# Patient Record
Sex: Male | Born: 1945 | Race: Black or African American | Hispanic: No | Smoking: Former smoker
Health system: Southern US, Community
[De-identification: ages and names within clinical notes are randomized; demographics above are authoritative.]

## PROBLEM LIST (undated history)

## (undated) DIAGNOSIS — I4891 Unspecified atrial fibrillation: Secondary | ICD-10-CM

## (undated) DIAGNOSIS — M109 Gout, unspecified: Secondary | ICD-10-CM

## (undated) DIAGNOSIS — E785 Hyperlipidemia, unspecified: Secondary | ICD-10-CM

## (undated) DIAGNOSIS — I639 Cerebral infarction, unspecified: Secondary | ICD-10-CM

## (undated) DIAGNOSIS — D696 Thrombocytopenia, unspecified: Secondary | ICD-10-CM

## (undated) DIAGNOSIS — I10 Essential (primary) hypertension: Secondary | ICD-10-CM

## (undated) DIAGNOSIS — E042 Nontoxic multinodular goiter: Secondary | ICD-10-CM

## (undated) HISTORY — DX: Morbid (severe) obesity due to excess calories: E66.01

## (undated) HISTORY — DX: Nontoxic multinodular goiter: E04.2

## (undated) HISTORY — DX: Hyperlipidemia, unspecified: E78.5

## (undated) HISTORY — PX: THYROID SURGERY: SHX805

## (undated) HISTORY — DX: Unspecified atrial fibrillation: I48.91

---

## 1998-10-06 ENCOUNTER — Encounter: Admission: RE | Admit: 1998-10-06 | Discharge: 1998-11-11 | Payer: Self-pay | Admitting: *Deleted

## 2000-04-04 ENCOUNTER — Encounter: Payer: Self-pay | Admitting: General Surgery

## 2000-04-04 ENCOUNTER — Encounter (INDEPENDENT_AMBULATORY_CARE_PROVIDER_SITE_OTHER): Payer: Self-pay | Admitting: Specialist

## 2000-04-04 ENCOUNTER — Ambulatory Visit (HOSPITAL_COMMUNITY): Admission: RE | Admit: 2000-04-04 | Discharge: 2000-04-04 | Payer: Self-pay | Admitting: General Surgery

## 2002-06-23 ENCOUNTER — Encounter: Payer: Self-pay | Admitting: General Surgery

## 2002-06-23 ENCOUNTER — Ambulatory Visit (HOSPITAL_COMMUNITY): Admission: RE | Admit: 2002-06-23 | Discharge: 2002-06-23 | Payer: Self-pay | Admitting: General Surgery

## 2002-08-11 ENCOUNTER — Encounter: Payer: Self-pay | Admitting: General Surgery

## 2002-08-13 ENCOUNTER — Encounter (INDEPENDENT_AMBULATORY_CARE_PROVIDER_SITE_OTHER): Payer: Self-pay | Admitting: *Deleted

## 2002-08-13 ENCOUNTER — Ambulatory Visit (HOSPITAL_COMMUNITY): Admission: RE | Admit: 2002-08-13 | Discharge: 2002-08-14 | Payer: Self-pay | Admitting: General Surgery

## 2006-11-26 ENCOUNTER — Ambulatory Visit: Payer: Self-pay | Admitting: Cardiology

## 2006-12-12 ENCOUNTER — Ambulatory Visit: Payer: Self-pay

## 2007-01-02 ENCOUNTER — Ambulatory Visit: Payer: Self-pay | Admitting: Cardiology

## 2007-12-11 ENCOUNTER — Ambulatory Visit: Payer: Self-pay | Admitting: Cardiology

## 2009-03-10 ENCOUNTER — Ambulatory Visit: Payer: Self-pay | Admitting: Cardiology

## 2009-03-10 DIAGNOSIS — I480 Paroxysmal atrial fibrillation: Secondary | ICD-10-CM | POA: Insufficient documentation

## 2010-04-05 NOTE — Assessment & Plan Note (Signed)
Summary: [facility]   Visit Type:  Follow-up Primary Provider:  Dr. Vernon Prey  CC:  Atrial Fibrillation.  History of Present Illness: The patient presents for yearly followup. Since I last saw him he has had no new cardiovascular complaints. He has never felt palpitations with his permanent atrial fibrillation. He denies any presyncope or syncope. He has no chest, neck or arm discomfort. He does have some mild bilateral lower extremity swelling. He does not have new shortness of breath and has no PND or orthopnea. He is limited by his morbid obesity and joint problems.  Current Medications (verified): 1)  Celebrex 200 Mg Caps (Celecoxib) .Marland Kitchen.. 1 By Mouth Daily 2)  Levothyroxine Sodium 125 Mcg Tabs (Levothyroxine Sodium) .Marland Kitchen.. 1 By Mouth Daily 3)  Ramipril 10 Mg Caps (Ramipril) .Marland Kitchen.. 1 By Mouth Daily 4)  Allopurinol 300 Mg Tabs (Allopurinol) .Marland Kitchen.. 1 By Mouth Daily 5)  Furosemide 40 Mg Tabs (Furosemide) .Marland Kitchen.. 1 By Mouth Daily 6)  Simvastatin 40 Mg Tabs (Simvastatin) .... 1/2 By Mouth Every Other Day 7)  Metoprolol Tartrate 100 Mg Tabs (Metoprolol Tartrate) .Marland Kitchen.. 1 By Mouth Daily 8)  Aspirin 325 Mg  Tabs (Aspirin) .Marland Kitchen.. 1 By Mouth Daily 9)  Klor-Con M20 20 Meq Cr-Tabs (Potassium Chloride Crys Cr) .Marland Kitchen.. 1 By Mouth Daily 10)  Exforge 10-320 Mg Tabs (Amlodipine Besylate-Valsartan) .... 1/2 By Mouth Daily  Allergies (verified): No Known Drug Allergies  Past History:  Past Medical History:  Hypertension times approximately 20 years,   multinodular goiter, morbid obesity, atrial fibrillation  Past Surgical History: Thyroid surgery in 2005.   Review of Systems       As stated in the HPI and negative for all other systems.   Vital Signs:  Patient profile:   65 year old male Height:      67 inches Weight:      350 pounds Pulse rate:   80 / minute Resp:     16 per minute BP sitting:   114 / 74  (left arm)  Vitals Entered By: Marrion Coy, CNA (March 10, 2009 9:26 AM)  Physical  Exam  General:  Well developed, well nourished, in no acute distress. Head:  normocephalic and atraumatic Mouth:  Gums and palate normal. Oral mucosa normal. Neck:  Neck supple, no JVD. No masses, thyromegaly or abnormal cervical nodes. Chest Wall:  no deformities or breast masses noted Lungs:  Clear bilaterally to auscultation and percussion. Heart:  Non-displaced PMI, chest non-tender; irregular rate and rhythm, S1, S2 without murmurs, rubs or gallops. Carotid upstroke normal, no bruit.  Abdomen:  Bowel sounds positive; abdomen soft and non-tender without masses, or hernias noted. No hepatosplenomegaly, obese, unable to appreciate organomegaly or midline pulsatile mass secondary to his size. Msk:  Back normal, normal gait. Muscle strength and tone normal. Extremities:  mild bilateral lower extremity edema Neurologic:  Alert and oriented x 3. Skin:  Intact without lesions or rashes. Psych:  Normal affect.   EKG  Procedure date:  03/10/2009  Findings:      atrial fibrillation, rate 80, axis within normal limits, intervals within normal limits, poor anterior R-wave progression, no acute ST-T wave changes.  Impression & Recommendations:  Problem # 1:  FIBRILLATION, ATRIAL (ICD-427.31) The patient has no symptoms related to this.  He still has a CHADS score of 1 and does not need anticoagulation.  At this point he will continue on the meds as listed without change in therapy. He udnerstands the need to lose  weight with diet and exercise.

## 2010-05-12 ENCOUNTER — Other Ambulatory Visit (HOSPITAL_COMMUNITY): Payer: Medicare Other

## 2010-05-12 ENCOUNTER — Emergency Department (HOSPITAL_COMMUNITY): Payer: Medicare Other

## 2010-05-12 ENCOUNTER — Inpatient Hospital Stay (HOSPITAL_COMMUNITY)
Admission: EM | Admit: 2010-05-12 | Discharge: 2010-05-16 | DRG: 065 | Disposition: A | Discharge status: Rehabilitation Facility | Payer: Medicare Other | Attending: Internal Medicine | Admitting: Internal Medicine

## 2010-05-12 DIAGNOSIS — E039 Hypothyroidism, unspecified: Secondary | ICD-10-CM | POA: Diagnosis present

## 2010-05-12 DIAGNOSIS — E785 Hyperlipidemia, unspecified: Secondary | ICD-10-CM | POA: Diagnosis present

## 2010-05-12 DIAGNOSIS — I517 Cardiomegaly: Secondary | ICD-10-CM

## 2010-05-12 DIAGNOSIS — I10 Essential (primary) hypertension: Secondary | ICD-10-CM | POA: Diagnosis present

## 2010-05-12 DIAGNOSIS — M109 Gout, unspecified: Secondary | ICD-10-CM | POA: Diagnosis present

## 2010-05-12 DIAGNOSIS — G8190 Hemiplegia, unspecified affecting unspecified side: Secondary | ICD-10-CM | POA: Diagnosis present

## 2010-05-12 DIAGNOSIS — I6529 Occlusion and stenosis of unspecified carotid artery: Secondary | ICD-10-CM | POA: Diagnosis present

## 2010-05-12 DIAGNOSIS — J45909 Unspecified asthma, uncomplicated: Secondary | ICD-10-CM | POA: Diagnosis present

## 2010-05-12 DIAGNOSIS — R4789 Other speech disturbances: Secondary | ICD-10-CM | POA: Diagnosis present

## 2010-05-12 DIAGNOSIS — R29818 Other symptoms and signs involving the nervous system: Secondary | ICD-10-CM

## 2010-05-12 DIAGNOSIS — Z79899 Other long term (current) drug therapy: Secondary | ICD-10-CM

## 2010-05-12 DIAGNOSIS — I6350 Cerebral infarction due to unspecified occlusion or stenosis of unspecified cerebral artery: Principal | ICD-10-CM | POA: Diagnosis present

## 2010-05-12 DIAGNOSIS — E042 Nontoxic multinodular goiter: Secondary | ICD-10-CM | POA: Diagnosis present

## 2010-05-12 DIAGNOSIS — Z7982 Long term (current) use of aspirin: Secondary | ICD-10-CM

## 2010-05-12 DIAGNOSIS — I4891 Unspecified atrial fibrillation: Secondary | ICD-10-CM | POA: Diagnosis present

## 2010-05-12 HISTORY — DX: Essential (primary) hypertension: I10

## 2010-05-12 LAB — DIFFERENTIAL
Basophils Absolute: 0 10*3/uL (ref 0.0–0.1)
Basophils Relative: 1 % (ref 0–1)
Eosinophils Absolute: 0.2 10*3/uL (ref 0.0–0.7)
Eosinophils Relative: 5 % (ref 0–5)
Lymphocytes Relative: 29 % (ref 12–46)
Lymphs Abs: 1.1 10*3/uL (ref 0.7–4.0)
Monocytes Absolute: 0.6 10*3/uL (ref 0.1–1.0)
Monocytes Relative: 16 % — ABNORMAL HIGH (ref 3–12)
Neutro Abs: 2 10*3/uL (ref 1.7–7.7)
Neutrophils Relative %: 50 % (ref 43–77)

## 2010-05-12 LAB — URINALYSIS, ROUTINE W REFLEX MICROSCOPIC
Bilirubin Urine: NEGATIVE
Glucose, UA: NEGATIVE mg/dL
Hgb urine dipstick: NEGATIVE
Specific Gravity, Urine: 1.019 (ref 1.005–1.030)
Urobilinogen, UA: 1 mg/dL (ref 0.0–1.0)

## 2010-05-12 LAB — CBC
HCT: 45.9 % (ref 39.0–52.0)
Hemoglobin: 15.2 g/dL (ref 13.0–17.0)
MCH: 31.7 pg (ref 26.0–34.0)
MCHC: 33.1 g/dL (ref 30.0–36.0)
MCV: 95.8 fL (ref 78.0–100.0)
Platelets: 110 10*3/uL — ABNORMAL LOW (ref 150–400)
RBC: 4.79 MIL/uL (ref 4.22–5.81)
RDW: 13.8 % (ref 11.5–15.5)
WBC: 3.9 10*3/uL — ABNORMAL LOW (ref 4.0–10.5)

## 2010-05-12 LAB — COMPREHENSIVE METABOLIC PANEL
AST: 17 U/L (ref 0–37)
AST: 18 U/L (ref 0–37)
Albumin: 3.3 g/dL — ABNORMAL LOW (ref 3.5–5.2)
Alkaline Phosphatase: 86 U/L (ref 39–117)
BUN: 14 mg/dL (ref 6–23)
BUN: 13 mg/dL (ref 6–23)
CO2: 27 mEq/L (ref 19–32)
Calcium: 8.5 mg/dL (ref 8.4–10.5)
Chloride: 102 mEq/L (ref 96–112)
Chloride: 104 mEq/L (ref 96–112)
Creatinine, Ser: 1 mg/dL (ref 0.4–1.5)
Creatinine, Ser: 0.91 mg/dL (ref 0.4–1.5)
GFR calc Af Amer: >60 mL/min (ref >60)
GFR calc non Af Amer: >60 mL/min (ref >60)
Potassium: 3.9 mEq/L (ref 3.5–5.1)
Total Bilirubin: 0.4 mg/dL (ref 0.3–1.2)
Total Protein: 8 g/dL (ref 6.0–8.3)

## 2010-05-12 LAB — HEMOGLOBIN A1C: Mean Plasma Glucose: 120 mg/dL — ABNORMAL HIGH (ref <117)

## 2010-05-12 LAB — LIPID PANEL
Cholesterol: 154 mg/dL (ref 0–200)
LDL Cholesterol: 108 mg/dL — ABNORMAL HIGH (ref 0–99)
Total CHOL/HDL Ratio: 4.4 RATIO

## 2010-05-12 LAB — URINE MICROSCOPIC-ADD ON

## 2010-05-12 LAB — GLUCOSE, CAPILLARY
Glucose-Capillary: 96 mg/dL (ref 70–99)
Glucose-Capillary: 146 mg/dL — ABNORMAL HIGH (ref 70–99)

## 2010-05-12 LAB — CARDIAC PANEL(CRET KIN+CKTOT+MB+TROPI)
CK, MB: 0.9 ng/mL (ref 0.3–4.0)
Relative Index: INVALID (ref 0.0–2.5)
Total CK: 88 U/L (ref 7–232)

## 2010-05-12 LAB — APTT: aPTT: 30 seconds (ref 24–37)

## 2010-05-12 LAB — CK TOTAL AND CKMB (NOT AT ARMC)
CK, MB: 1.1 ng/mL (ref 0.3–4.0)
Total CK: 81 U/L (ref 7–232)

## 2010-05-13 ENCOUNTER — Encounter (HOSPITAL_COMMUNITY): Payer: Self-pay | Admitting: Radiology

## 2010-05-13 ENCOUNTER — Inpatient Hospital Stay (HOSPITAL_COMMUNITY): Payer: Medicare Other

## 2010-05-13 DIAGNOSIS — I6340 Cerebral infarction due to embolism of unspecified cerebral artery: Secondary | ICD-10-CM

## 2010-05-13 LAB — LIPID PANEL
LDL Cholesterol: 103 mg/dL — ABNORMAL HIGH (ref 0–99)
Triglycerides: 59 mg/dL (ref <150)
VLDL: 12 mg/dL (ref 0–40)

## 2010-05-13 LAB — BASIC METABOLIC PANEL
CO2: 27 mEq/L (ref 19–32)
Calcium: 8.7 mg/dL (ref 8.4–10.5)
Creatinine, Ser: 0.86 mg/dL (ref 0.4–1.5)
GFR calc Af Amer: >60 mL/min (ref >60)
GFR calc non Af Amer: >60 mL/min (ref >60)
Glucose, Bld: 100 mg/dL — ABNORMAL HIGH (ref 70–99)
Sodium: 135 mEq/L (ref 135–145)

## 2010-05-13 LAB — CBC
Hemoglobin: 14.8 g/dL (ref 13.0–17.0)
MCH: 31.7 pg (ref 26.0–34.0)
MCHC: 33.6 g/dL (ref 30.0–36.0)
RDW: 13.7 % (ref 11.5–15.5)

## 2010-05-13 LAB — GLUCOSE, CAPILLARY
Glucose-Capillary: 100 mg/dL — ABNORMAL HIGH (ref 70–99)
Glucose-Capillary: 101 mg/dL — ABNORMAL HIGH (ref 70–99)

## 2010-05-13 MED ORDER — IOHEXOL 350 MG/ML SOLN
75.0000 mL | Freq: Once | INTRAVENOUS | Status: AC | PRN
  Administered 2010-05-13: 75 mL via INTRAVENOUS

## 2010-05-14 LAB — GLUCOSE, CAPILLARY

## 2010-05-14 LAB — PROTIME-INR
INR: 1.09 (ref 0.00–1.49)
Prothrombin Time: 14.3 seconds (ref 11.6–15.2)

## 2010-05-15 LAB — BASIC METABOLIC PANEL
Chloride: 104 mEq/L (ref 96–112)
Creatinine, Ser: 0.84 mg/dL (ref 0.4–1.5)
GFR calc Af Amer: >60 mL/min (ref >60)
GFR calc non Af Amer: >60 mL/min (ref >60)
Potassium: 4.4 mEq/L (ref 3.5–5.1)

## 2010-05-15 LAB — GLUCOSE, CAPILLARY: Glucose-Capillary: 111 mg/dL — ABNORMAL HIGH (ref 70–99)

## 2010-05-15 LAB — PROTIME-INR: INR: 1.38 (ref 0.00–1.49)

## 2010-05-15 LAB — TSH: TSH: 1.702 u[IU]/mL (ref 0.350–4.500)

## 2010-05-16 ENCOUNTER — Inpatient Hospital Stay (HOSPITAL_COMMUNITY)
Admission: RE | Admit: 2010-05-16 | Discharge: 2010-06-10 | DRG: 945 | Disposition: A | Discharge status: Home Health | Payer: Medicare Other | Source: Other Acute Inpatient Hospital | Attending: Physical Medicine & Rehabilitation | Admitting: Physical Medicine & Rehabilitation

## 2010-05-16 DIAGNOSIS — R55 Syncope and collapse: Secondary | ICD-10-CM | POA: Diagnosis not present

## 2010-05-16 DIAGNOSIS — E039 Hypothyroidism, unspecified: Secondary | ICD-10-CM | POA: Diagnosis present

## 2010-05-16 DIAGNOSIS — Z7901 Long term (current) use of anticoagulants: Secondary | ICD-10-CM

## 2010-05-16 DIAGNOSIS — I6350 Cerebral infarction due to unspecified occlusion or stenosis of unspecified cerebral artery: Secondary | ICD-10-CM | POA: Diagnosis present

## 2010-05-16 DIAGNOSIS — I10 Essential (primary) hypertension: Secondary | ICD-10-CM | POA: Diagnosis present

## 2010-05-16 DIAGNOSIS — M109 Gout, unspecified: Secondary | ICD-10-CM | POA: Diagnosis present

## 2010-05-16 DIAGNOSIS — D696 Thrombocytopenia, unspecified: Secondary | ICD-10-CM | POA: Diagnosis present

## 2010-05-16 DIAGNOSIS — I4891 Unspecified atrial fibrillation: Secondary | ICD-10-CM | POA: Diagnosis present

## 2010-05-16 DIAGNOSIS — I6330 Cerebral infarction due to thrombosis of unspecified cerebral artery: Secondary | ICD-10-CM

## 2010-05-16 DIAGNOSIS — M1710 Unilateral primary osteoarthritis, unspecified knee: Secondary | ICD-10-CM | POA: Diagnosis present

## 2010-05-16 DIAGNOSIS — G8190 Hemiplegia, unspecified affecting unspecified side: Secondary | ICD-10-CM | POA: Diagnosis present

## 2010-05-16 DIAGNOSIS — Z5189 Encounter for other specified aftercare: Principal | ICD-10-CM

## 2010-05-16 DIAGNOSIS — E119 Type 2 diabetes mellitus without complications: Secondary | ICD-10-CM | POA: Diagnosis present

## 2010-05-16 DIAGNOSIS — Z7982 Long term (current) use of aspirin: Secondary | ICD-10-CM

## 2010-05-16 DIAGNOSIS — Z87891 Personal history of nicotine dependence: Secondary | ICD-10-CM

## 2010-05-16 DIAGNOSIS — R29810 Facial weakness: Secondary | ICD-10-CM | POA: Diagnosis present

## 2010-05-16 LAB — GLUCOSE, CAPILLARY
Glucose-Capillary: 97 mg/dL (ref 70–99)
Glucose-Capillary: 99 mg/dL (ref 70–99)

## 2010-05-16 LAB — PROTIME-INR: Prothrombin Time: 20.8 seconds — ABNORMAL HIGH (ref 11.6–15.2)

## 2010-05-17 DIAGNOSIS — Z5189 Encounter for other specified aftercare: Secondary | ICD-10-CM

## 2010-05-17 DIAGNOSIS — I6330 Cerebral infarction due to thrombosis of unspecified cerebral artery: Secondary | ICD-10-CM

## 2010-05-17 LAB — DIFFERENTIAL
Eosinophils Absolute: 0.1 10*3/uL (ref 0.0–0.7)
Lymphocytes Relative: 18 % (ref 12–46)
Lymphs Abs: 1.2 10*3/uL (ref 0.7–4.0)
Monocytes Relative: 21 % — ABNORMAL HIGH (ref 3–12)
Neutro Abs: 4 10*3/uL (ref 1.7–7.7)
Neutrophils Relative %: 60 % (ref 43–77)

## 2010-05-17 LAB — CBC
HCT: 48.7 % (ref 39.0–52.0)
Hemoglobin: 16.9 g/dL (ref 13.0–17.0)
MCH: 32.4 pg (ref 26.0–34.0)
MCV: 93.3 fL (ref 78.0–100.0)
RBC: 5.22 MIL/uL (ref 4.22–5.81)
WBC: 6.8 10*3/uL (ref 4.0–10.5)

## 2010-05-17 LAB — COMPREHENSIVE METABOLIC PANEL
Alkaline Phosphatase: 87 U/L (ref 39–117)
BUN: 15 mg/dL (ref 6–23)
CO2: 27 mEq/L (ref 19–32)
Chloride: 100 mEq/L (ref 96–112)
Creatinine, Ser: 0.95 mg/dL (ref 0.4–1.5)
GFR calc non Af Amer: >60 mL/min (ref >60)
Glucose, Bld: 106 mg/dL — ABNORMAL HIGH (ref 70–99)
Potassium: 4.5 mEq/L (ref 3.5–5.1)
Total Bilirubin: 0.8 mg/dL (ref 0.3–1.2)

## 2010-05-17 LAB — GLUCOSE, CAPILLARY
Glucose-Capillary: 104 mg/dL — ABNORMAL HIGH (ref 70–99)
Glucose-Capillary: 107 mg/dL — ABNORMAL HIGH (ref 70–99)

## 2010-05-18 ENCOUNTER — Inpatient Hospital Stay (HOSPITAL_COMMUNITY): Payer: Medicare Other

## 2010-05-18 LAB — BASIC METABOLIC PANEL
BUN: 24 mg/dL — ABNORMAL HIGH (ref 6–23)
CO2: 26 mEq/L (ref 19–32)
Chloride: 99 mEq/L (ref 96–112)
Creatinine, Ser: 1.24 mg/dL (ref 0.4–1.5)
Glucose, Bld: 112 mg/dL — ABNORMAL HIGH (ref 70–99)
Potassium: 4.2 mEq/L (ref 3.5–5.1)

## 2010-05-18 LAB — GLUCOSE, CAPILLARY
Glucose-Capillary: 102 mg/dL — ABNORMAL HIGH (ref 70–99)
Glucose-Capillary: 129 mg/dL — ABNORMAL HIGH (ref 70–99)

## 2010-05-18 LAB — CBC
HCT: 47.2 % (ref 39.0–52.0)
Hemoglobin: 16.5 g/dL (ref 13.0–17.0)
MCH: 32.7 pg (ref 26.0–34.0)
MCV: 93.5 fL (ref 78.0–100.0)
RBC: 5.05 MIL/uL (ref 4.22–5.81)
WBC: 7.6 10*3/uL (ref 4.0–10.5)

## 2010-05-18 LAB — PROTIME-INR: INR: 1.76 — ABNORMAL HIGH (ref 0.00–1.49)

## 2010-05-19 ENCOUNTER — Inpatient Hospital Stay (HOSPITAL_COMMUNITY): Payer: Medicare Other

## 2010-05-19 LAB — GLUCOSE, CAPILLARY
Glucose-Capillary: 91 mg/dL (ref 70–99)
Glucose-Capillary: 141 mg/dL — ABNORMAL HIGH (ref 70–99)

## 2010-05-19 LAB — PROTIME-INR: Prothrombin Time: 22.6 seconds — ABNORMAL HIGH (ref 11.6–15.2)

## 2010-05-19 LAB — URINE CULTURE
Colony Count: 85000
Culture  Setup Time: 201203140852

## 2010-05-20 DIAGNOSIS — Z5189 Encounter for other specified aftercare: Secondary | ICD-10-CM

## 2010-05-20 DIAGNOSIS — I6330 Cerebral infarction due to thrombosis of unspecified cerebral artery: Secondary | ICD-10-CM

## 2010-05-20 LAB — BASIC METABOLIC PANEL
CO2: 26 mEq/L (ref 19–32)
Calcium: 9.3 mg/dL (ref 8.4–10.5)
Chloride: 97 mEq/L (ref 96–112)
Creatinine, Ser: 1.36 mg/dL (ref 0.4–1.5)
GFR calc Af Amer: >60 mL/min (ref >60)
Sodium: 133 mEq/L — ABNORMAL LOW (ref 135–145)

## 2010-05-20 LAB — GLUCOSE, CAPILLARY
Glucose-Capillary: 88 mg/dL (ref 70–99)
Glucose-Capillary: 88 mg/dL (ref 70–99)
Glucose-Capillary: 93 mg/dL (ref 70–99)

## 2010-05-20 LAB — PROTIME-INR: INR: 2.34 — ABNORMAL HIGH (ref 0.00–1.49)

## 2010-05-20 NOTE — H&P (Signed)
NAME:  Omar [name], Omar [name] NO.:  [id number]  MEDICAL RECORD NO.:  [id number]           PATIENT TYPE:  I  LOCATION:  4010                         FACILITY:  [facility]  PHYSICIAN:  Ranelle Oyster, M.D.DATE OF BIRTH:  [birth year]  DATE OF ADMISSION:  05/16/2010 DATE OF DISCHARGE:                             HISTORY & PHYSICAL   CHIEF COMPLAINTS:  Right-sided weakness.  PRIMARY CARE PHYSICIAN:  Ernestina Penna, M.D.  CARDIOLOGIST:  Rollene Rotunda, MD, [facility].  NEUROLOGIST:  Pramod P. Pearlean Brownie, MD.  HISTORY OF PRESENT ILLNESS:  This is a 65 year old African American male with morbid obesity, AFib, and hypertension who was admitted on May 12, 2010, with right-sided weakness and an associated fall.  The CT was without acute abnormalities.  MRI was not done secondary to body habitus.  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  He has also had small vessel disease without acute infarct.  Neurology evaluated the patient, felt he has subcortical stroke.  Coumadin was recommended for CVA prophylaxis.  He continues to have right hemiparesis, mild right facial weakness.  I was asked to see the patient on Friday, March 9, and felt he could benefit from inpatient rehab stay.  REVIEW OF SYSTEMS:  Notable for weakness and constipation.  Full 12- point review is in the written H and P.  PAST MEDICAL HISTORY:  Notable for the above as well as right thyroid lobectomy in 2004, gout, bilateral knee osteoarthritis, left greater than right.  FAMILY HISTORY:  Positive for stomach issues and strokes.  SOCIAL HISTORY:  The patient is married, retired Curator.  He quit smoking 14 years ago and rarely drinks.  Family will assist at discharge.  Daughter works as an Engineer, production apparently and wife is limited due to her back.  ALLERGIES:  ANACIN, LIPITOR, NIASPAN.  HOME MEDICATIONS AND LABS:  Please see written H and  P.  PHYSICAL EXAMINATION:  VITAL SIGNS:  Blood pressure 115/72, pulse is 84, respiratory rate is 20, temperature 98.1. GENERAL:  The patient is pleasant, alert, lying in bed. HEENT:  Pupils equally round and reactive to light.  Ears, nose, and throat exam notable for multiple missing teeth.  Mucosa is pink and moist. NECK:  Supple without JVD or lymphadenopathy. CHEST:  Clear to auscultation bilaterally without wheezes, rales, or rhonchi. HEART:  Regular rate and rhythm without murmurs, rubs, or gallops. EXTREMITIES:  Pitting edema 1+ with chronic vascular changes. ABDOMEN:  Soft, nontender.  Bowel sounds are positive. SKIN:  Notable for the above as well as some scrotal edema also. NEUROLOGIC:  Cranial nerve exam II through XII is notable for some mild right facial weakness but no sensory loss.  Seemed to have a bit more attention to the left side with peripheral field testing as well. Sensation was grossly intact to pinprick and light touch throughout the right arm and leg today on examination.  Reflexes are 1+ and no resting tone was noted.  Strength was 0/5, right upper extremity proximal, distal.  I may have seen a flicker at the  fingers.  Right hip flexors and abductors are trace to 1.  I saw no active movement at the knee or ankle today.  Left upper extremity, left lower extremity are generally 4 to 5 out of 5 proximal and distal.  Judgment, orientation, memory seemed be fairly appropriate.  Mood was pleasant.  POST ADMISSION PHYSICIAN EVALUATION: 1. Functional deficit secondary to left subcortical CVA with primarily     right hemiparesis.  The patient is morbidly obese as well. 2. The patient is admitted to receive collaborative interdisciplinary     care between the physiatrist, rehab nursing staff, and therapy     team. 3. The patient's level of medical complexity and substantial therapy     needs in context of that medical necessity cannot be provided at     lesser  intensity of care. 4. The patient has experienced substantial functional loss from his     baseline.  Premorbidly, he was independent walking with cane.     Currently, he is mod to max assist upper body care, total assist     lower body care, total assist to sit edge of bed, unable to stand     at this point.  Judging by the patient's diagnosis, physical exam,     and functional history, he has potential for functional progress     which will result in measurable gains while inpatient rehab.  His     gains will be of substantial and practical use upon discharge to     home if facilitating mobility and self-care.  Interim changes since     my consult are detailed above. 5. The physiatrist will provide 24-hour management of medical needs as     well as oversight of the therapy plan/treatment and provide     guidance as appropriate during interaction of the two.  Medical     problem list and plan are below. 6. A 24-hour rehab nursing team will assist in manage of the patient's     skin care needs as well as pain management, integration of therapy     concepts, techniques, nutrition, etc. 7. PT will assess and treat for lower extremity strength, range of     motion, adaptive techniques, equipment, functional ability,     neuromuscular education.  The patient and family/caregiver     education.  Goals overall are min assist. 8. OT will assess and treat for upper extremity use, ADLs, adaptive     techniques, equipment, functional mobility, self-care,     neuromuscular education with goals modified independent to min     assist. 9. Speech language pathology will assess and treat briefly for     communication and cognitive issues with goals modified independent. 10.Case management social worker will assess and treat for     psychosocial issues in discharge planning. 11.Team conference will be held weekly to assess progress towards     goals and to determine barriers at discharge. 12.The  patient has demonstrated sufficient medical stability and     exercise capacity to tolerate 2 to 3 hours therapy per day at least     5 days per week. 13.Estimated length of stay is 3+ weeks.  Prognosis fair to good.  MEDICAL PROBLEM LIST AND PLAN: 1. DVT prophylaxis/anticoagulation with subcu Lovenox to Coumadin with     most recent INR still subtherapeutic 2. Pain management:  Add Voltaren gel to left knee and add p.r.n.     Tylenol.  Also, we will provide  Ultram p.r.n. for headaches which     have been a problem at times. 3. Atrial fibrillation:  Blood pressure and heart rates controlled.     Continue metoprolol for now. 4. Mood:  We will provide ego support and depression screen while on     rehab.  The patient seemed to be in positive today on exam. 5. Hypertension:  Continue to follow blood pressures daily.     Metoprolol on board as well as Lasix, Diovan, Altace. 6. Gout:  Allopurinol daily for prophylaxis with colchicine for     flares. 7. Dyslipidemia:  Lipitor at bedtime. 8. Hypothyroidism supplement daily.  Most recent testing within normal     range. 9. Thrombocytopenia:  We will monitor while on Lovenox and now with     Coumadin on board for any signs or symptoms of bleeding. 10.Impaired fasting glucose:  Educate on appropriate cardiac diet as     well as stroke risk diet. 11.Morbid obesity:  Watch skin and assess for any other symptoms such     as sleep apnea, etc. 12.Incontinence:  UA, C and S will be checked on admission. 13.Constipation:  We will add Dulcolax suppository tonight for BM if     no results with sorbitol.     Ranelle Oyster, M.D.     ZTS/MEDQ  D:  05/16/2010  T:  05/17/2010  Job:  [id number]  Electronically Signed by Faith Rogue M.D. on 05/20/2010 04:19:07 PM

## 2010-05-21 LAB — GLUCOSE, CAPILLARY: Glucose-Capillary: 124 mg/dL — ABNORMAL HIGH (ref 70–99)

## 2010-05-21 LAB — PROTIME-INR: INR: 2.86 — ABNORMAL HIGH (ref 0.00–1.49)

## 2010-05-22 LAB — GLUCOSE, CAPILLARY
Glucose-Capillary: 96 mg/dL (ref 70–99)
Glucose-Capillary: 100 mg/dL — ABNORMAL HIGH (ref 70–99)
Glucose-Capillary: 107 mg/dL — ABNORMAL HIGH (ref 70–99)

## 2010-05-22 LAB — PROTIME-INR: INR: 2.96 — ABNORMAL HIGH (ref 0.00–1.49)

## 2010-05-23 LAB — GLUCOSE, CAPILLARY
Glucose-Capillary: 89 mg/dL (ref 70–99)
Glucose-Capillary: 117 mg/dL — ABNORMAL HIGH (ref 70–99)

## 2010-05-23 LAB — PROTIME-INR: Prothrombin Time: 33.7 seconds — ABNORMAL HIGH (ref 11.6–15.2)

## 2010-05-24 LAB — PROTIME-INR: INR: 3.04 — ABNORMAL HIGH (ref 0.00–1.49)

## 2010-05-24 LAB — GLUCOSE, CAPILLARY
Glucose-Capillary: 95 mg/dL (ref 70–99)
Glucose-Capillary: 120 mg/dL — ABNORMAL HIGH (ref 70–99)

## 2010-05-24 NOTE — Consult Note (Signed)
NAME:  Omar [name], Omar [name] NO.:  [id number]  MEDICAL RECORD NO.:  [id number]           PATIENT TYPE:  I  LOCATION:  4010                         FACILITY:  [facility]  PHYSICIAN:  Thana Farr, MD    DATE OF BIRTH:  1945/11/01  DATE OF CONSULTATION:  05/18/2010 DATE OF DISCHARGE:                                CONSULTATION   HISTORY:  Omar [name] is a 65 year old male that was initially seen on May 13, 2010, with onset of right-sided weakness.  The patient had a head CT that was unremarkable, has a history of AFib with a subtherapeutic INR.  History and exam was consistent with stroke and the patient was diagnosed with acute stroke.  Was unable to have MRI of the brain secondary to size.  The patient has since been admitted to rehab. Today when they were attempting to get the patient up for therapy, he had an episode of syncope.  Did have some worsening and slurred speech. Slurred speech has returned back to baseline, and a consult was called for further recommendation.  Also seems that the patient has complaint of headache that has been intermittent.  Initially did not seem to respond well to Tylenol.  Ultram has been used in its place and the patient currently has no complaint of headache.  PAST MEDICAL HISTORY:  Obesity, hypertension, atrial fibrillation, multinodular goiter, gouty arthritis, and asthma.  MEDICATIONS:  Zyloprim, Norvasc, aspirin, Lovenox, vitamin D, Lasix, insulin, levothyroxine, Lopressor, potassium, Crestor, Diovan, and Coumadin.  SOCIAL HISTORY:  The patient is married.  At baseline, he uses a cane to ambulate.  He has no history of alcohol, tobacco, or illicit drug abuse.  PHYSICAL EXAMINATION:  VITAL SIGNS:  Blood pressure 117/77, heart rate 68, respiratory rate 18, temperature 98.5. MENTAL STATUS TESTING:  The patient is resting when I entered the room, but is easily awakened.  He is alert and follows commands without difficulty.  Speech  is only slightly dysarthric. CRANIAL NERVE TESTING:  Visual fields are grossly intact.  III, IV, and VI, extraocular movements intact.  V and VII right facial droop.  VIII grossly intact.  IX and X decreased gag.  XI decreased right shoulder shrug, and XII midline tongue extension.  General motor exam, the patient gives 0/5 strength in the right upper and right lower extremity. He gives 5/5 strength in the left upper extremity and 2/5 strength in the left lower extremity.  Deep tendon reflexes are 1+ in the upper extremities and absent in lower extremities.  Plantars are mute bilaterally.  Cerebellar testing not performed.  LABORATORY DATA:  White blood cell count of 7.6, hemoglobin/hematocrit 16.5 and 47.2 respectively.  Platelet count 129, INR 1.76.  Sodium 130, potassium 4.3, chloride 99, CO2 of 26, BUN and creatinine 24 and 1.24, glucose 112, calcium 8.6.  CTA showed occlusion of the left internal carotid artery.  CT of the head shows no acute changes.  All films were reviewed for this consult.  Echocardiogram results reviewed showed atrium to be moderately dilated.  It was felt to be a difficult study and the embolus cannot be identified.  ASSESSMENT: 1. Omar [name] by exam and history definitely has an acute subcortical     infarct.  Sensation is intact.  The patient does have significant     motor findings.  This is consistent with his history of     hypertension and with his history of atrial fibrillation.  It has     been recommended that the patient go on Coumadin with no bridge     therapy which has been initiated.  INR remains subtherapeutic at     1.76.  Dosing continues to be maintained by pharmacy.  There is a     target INR of 2-3. 2. Headache.  Headache can be seen often with acute stroke.  It is     intermittent.  It does seem to be adequately addressed with the     Ultram.  There was no evidence of hemorrhage or aneurysm on the     patient's initial imaging.  Agree  with continued use of Ultram     p.r.n. for headache. 3. Syncope.  The patient did have a syncopal episode today.  His BUN     is slightly elevated suggesting some dehydration which may have led     to some orthostasis and syncope as the patient was attempting to     stand and get up with his event.  He has had a stroke and therefore it is reasonable to     rule out the possibility of seizure.  We would also rule out the     possibility of a new event or possible bleed related to him being     on Coumadin.  The patient will need repeat imaging for that. 4. Family was concerned about the need for an MRI.  That would not     change the patient's treatment at this time.  This may be done as     an outpatient once the patient is discharged from therapy to     complete full workup at that time.  PLAN: 1. We will liberalize blood pressure.  Would decrease some of the     patient's antihypertensives with a target systolic blood pressure     of 130. 2. Fluids for rehydration. 3. EEG. 4. Head CT without contrast. 5. MRI to be done as an outpatient at discharge. 6. Continue Coumadin with target INR of 2-3. 7. Ultram p.r.n. for headache.          ______________________________ Thana Farr, MD     LR/MEDQ  D:  05/18/2010  T:  05/19/2010  Job:  [id number]  Electronically Signed by Thana Farr MD on 05/24/2010 11:41:46 AM

## 2010-05-25 LAB — CBC
Platelets: 222 10*3/uL (ref 150–400)
RBC: 5.03 MIL/uL (ref 4.22–5.81)
RDW: 13.1 % (ref 11.5–15.5)
WBC: 5.2 10*3/uL (ref 4.0–10.5)

## 2010-05-25 LAB — GLUCOSE, CAPILLARY
Glucose-Capillary: 90 mg/dL (ref 70–99)
Glucose-Capillary: 129 mg/dL — ABNORMAL HIGH (ref 70–99)
Glucose-Capillary: 87 mg/dL (ref 70–99)

## 2010-05-25 LAB — PROTIME-INR
INR: 2.68 — ABNORMAL HIGH (ref 0.00–1.49)
Prothrombin Time: 28.6 seconds — ABNORMAL HIGH (ref 11.6–15.2)

## 2010-05-25 NOTE — H&P (Signed)
NAME:  Omar [name], Omar [name] NO.:  [id number]  MEDICAL RECORD NO.:  [id number]           PATIENT TYPE:  E  LOCATION:  [facility]                         FACILITY:  [facility]  PHYSICIAN:  Clydia Llano, MD       DATE OF BIRTH:  1945/11/20  DATE OF ADMISSION:  05/12/2010 DATE OF DISCHARGE:                             HISTORY & PHYSICAL   PRIMARY CARE PHYSICIAN:  Ernestina Penna, MD with [facility].  REASON FOR ADMISSION:  Right-sided weakness.  HISTORY OF PRESENT ILLNESS:  Omar [name] is a 65 year old African American male with history of hypertension, hypothyroidism, and atrial fibrillation.  The patient is not on chronic anticoagulation with Coumadin.  The patient came in because of right-sided weakness.  The patient was in his usual state of health until last night.  When he went to the bed, he was doing okay.  He woke up about 1:30 to 2 in the morning and he wanted to go to the bathroom.  The patient experienced right-sided weakness and then he called the EMS to bring him to the hospital.  Family members noticed that he has some slurring of his speech.  He denies any visual disturbances.  Denies any headaches.  Upon initial evaluation in the emergency department, the patient's CT scan of the head is negative for intracranial bleeding.  His EKG showed atrial fibrillation with a slow ventricular response.  The patient will be admitted for further evaluation.  PAST MEDICAL HISTORY: 1. Hypertension. 2. Multinodular goiter. 3. Atrial fibrillation. 4. Morbid obesity. 5. Gouty arthritis. 6. Asthma.  SOCIAL HISTORY:  The patient lives at home with his wife.  Not a smoker or drinker.  No drug abuse.  FAMILY HISTORY:  No family history of CAD.  A brother has CVA.  ALLERGIES:  NKDA.  MEDICATIONS: 1. Aspirin. 2. Celebrex. 3. Colchicine. 4. Exforge. 5. Furosemide. 6. Klor-Con. 7. Levothyroxine. 8. Metoprolol. 9.  Ramipril. 10.Simvastatin. 11.Symbicort. 12.Vitamin D.  REVIEW OF SYSTEMS:  GENERAL:  The patient denies any fever, chills, sweats.  HEENT:  Denies headache, nasal discharge, or bleed.  SKIN: Denies rash, lesions.  CARDIAC:  Denies chest pain or palpitations. RESPIRATORY:  Denies shortness of breath or wheezes.  GU:  Denies frequency or urgency with urination.  NEUROLOGIC:  Right-sided weakness, slurred speech per HPI.  PSYCHIATRIC:  Denies any depression or anxiety. MUSCULOSKELETAL:  No arthralgia.  No joint pain or swelling.  GI: Denies nausea, vomiting, diarrhea.  ENDOCRINE:  Denies polyuria, dyspepsia.  HEMATOLOGIC:  Denies easy bruising or recent blood transfusion.  PHYSICAL EXAMINATION:  VITAL SIGNS:  Temperature is 98.1, respirations 22, pulse is 60, blood pressure is 136/67.  GENERAL:  The patient is a well-developed, well-nourished, obese African American male in no acute distress, laying on his back, wearing hospital gown. HEENT:  Normocephalic, atraumatic.  Eyes:  Pupils equal, reactive to light and accommodation.  The patient wears glasses.  Mouth without oral thrush or lesions. NECK:  Supple.  No masses.  No carotid bruits.  No lymphadenopathy. CARDIOVASCULAR:  Irregularly irregular rhythm.  No murmurs, rubs, or gallops.  RESPIRATORY:  Clear to auscultation bilaterally.  Chest wall symmetrical to breathing bilaterally. ABDOMEN:  Bowel sounds heard, soft, nontender, nondistended.  No hepatosplenomegaly appreciated.  The patient is obese. EXTREMITIES:  No deformity.  There is +1 edema of both legs. NEUROLOGIC:  Strength 5/5 in the left upper and lower extremity. Strength 4/5 in the right upper extremity, 3/5 in the right lower extremity.  Decreased sensation on the right side of the body.  No facial droop.  There is slight dysarthria.  Gait not tested. Coordination normal.  SKIN:  Color normal.  There is chronic venous stasis changes in both lower  extremities. PSYCHIATRIC:  No abnormality of mood or affect.  RADIOLOGY:  CT head showed no acute intracranial abnormality.  EKG showed atrial fibrillation with slow ventricular response.  No evidence of ischemia.  LABORATORY DATA:  CBC:  WBC 3.9, hemoglobin 15.2, hematocrit 45.9, platelets is 110.  Coagulation profile:  INR is 1.0 and PTT 26 seconds. BMP:  Sodium 136, potassium 3.9, chloride 102, bicarb is 27, glucose 102, BUN is 14, creatinine 1.0, calcium 8.6.  LFTs:  AST 17, ALT 12, alk phos 86, total bili 0.4, albumin 3.4, total protein 7.5.  Cardiac enzymes: Total CK 81, CK-MB 1.1, troponin less than 0.01.  PLAN: 1. Right-sided weakness:  The patient probably have acute stroke.  TPA     is contraindicated because of the stroke timing is unknown.  The     patient will have a stroke workup.  MRI will be done, carotid     duplex as well as echocardiogram.  The patient will be in telemetry     as well as 12-lead EKG to confirm the atrial fibrillation.  The     patient was not on Coumadin.  The patient will be placed on     Coumadin. 2. Atrial fibrillation:  The patient has chronic atrial fibrillation,     had CHADS-2 score of 1, and he was on aspirin.  The patient's now    CHADS-2 score is 3 with hypertension and stroke.  The patient will     be started on Coumadin. 3. Hypertension:  Seems controlled.  We will continue home blood     pressure medications. 4. Dyslipidemia:  The patient is taking simvastatin at home.  We will     obtain a fasting lipid profile.  Continue simvastatin.     Clydia Llano, MD     ME/MEDQ  D:  05/12/2010  T:  05/12/2010  Job:  [id number]  cc:   Ernestina Penna, M.D.  Electronically Signed by Clydia Llano  on 05/25/2010 08:18:31 PM

## 2010-05-25 NOTE — Discharge Summary (Signed)
NAME:  Omar [name], Omar [name] NO.:  [id number]  MEDICAL RECORD NO.:  [id number]           PATIENT TYPE:  I  LOCATION:  3008                         FACILITY:  [facility]  PHYSICIAN:  Clydia Llano, MD       DATE OF BIRTH:  January 24, 1946  DATE OF ADMISSION:  05/12/2010 DATE OF DISCHARGE:                        DISCHARGE SUMMARY - REFERRING   PRIMARY CARE PHYSICIAN:  Ernestina Penna, MD in [facility].  REASON FOR ADMISSION:  Right-sided weakness.  DISCHARGE DIAGNOSES: 1. Acute left brain stroke.  MRI was not done. 2. Chronic occlusion of the left internal carotid artery. 3. Atrial fibrillation. 4. Coumadin therapy. 5. Hypertension. 6. Dyslipidemia. 7. Morbid obesity. 8. Gouty arthritis. 9. Multinodular goiter. 10.Hypothyroidism. 11.Asthma.  DISCHARGE MEDICATIONS: 1. Aspirin 81 mg p.o. daily. 2. Ergocalciferol 50,000 units 1 capsule weekly. 3. Metoprolol 50 mg p.o. b.i.d. 4. Crestor 10 mg p.o. at bedtime. 5. Warfarin 5 mg p.o. daily. 6. Furosemide 20 mg p.o. daily. 7. Allopurinol 300 mg p.o. daily. 8. Colchicine 0.6 mg 4 times daily as needed for gout. 9. Exforge 10/320 half tablet p.o. daily. 10.Klor-Con 20 mEq p.o. daily. 11.Levothyroxine 125 mcg p.o. daily. 12.Ramipril 10 mg p.o. daily.  RADIOLOGY: 1. CT angiography of head and neck vessels showed left internal     carotid artery is occluded with reconstitution of the flow at the     carotid terminus level.  There is branch vessel atherosclerotic-     type changes with atherosclerotic type changes of vertebral artery     is more notable on the right, small-vessel disease type of changes.     CT head without contrast showed no acute intracranial     abnormalities.  Chest x-ray showed moderate enlargement of     cardiomediastinal silhouette with central vascular congestion, but     no focal abnormality. 2. Echocardiogram showed ejection fraction that is probably good.  The     study is  very limited because of the patient's body habitus. 3. Carotid duplex showed no significant extracranial carotid artery     stenosis.  BRIEF HISTORY EXAMINATION:  Omar [name] is 65 year old obese African American male with past medical history of hypertension and atrial fibrillation.  The patient was on aspirin because of CHADS2 score of 1. The patient came in to the hospital complaining about right-sided weakness.  The patient stated he was at his baseline the night before admission to the hospital and when he woke up in the morning, he felt he could not get up.  His whole right side was extremely weak.  The patient rushed to the hospital.  The patient evaluated initially by CT scan, which showed no abnormality, but the patient has very significant weakness in the right side and admitted for further evaluation.  BRIEF HOSPITAL COURSE: 1. Acute left brain stroke.  The patient admitted to the hospital and     was put on telemetry bed.  Neurology was consulted.  Because of the     patient's symptoms, onset was unknown.  Was not a TPA candidate.     Unfortunately because of the patient's  body habitus, he cannot fit     in the MRI scanner in the hospital.  So, outpatient open MRI was     recommended.  The patient did receive physical therapy,     occupational therapy as well as speech and language pathology.     Neurology recommended to put on baby aspirin and Coumadin.  When     the INR is more than 2, can discontinue the aspirin.  The stroke is     probably secondary to atrial fibrillation and the MRA showed left     internal carotid artery occlusion, which is probably chronic. 2. Left internal carotid artery occlusion.  The patient has     reconstitution of his carotid artery after the terminus.  Neurology     recommended to see outpatient neurovascular surgeon.  Meanwhile,     continue on the Coumadin. 3. Atrial fibrillation.  His CHADS2 score now is free.  The patient     was placed on  Coumadin.  His rate is well controlled with beta-     blockers. 4. Hypertension, controlled.  The patient was placed back on his home     medications.  The patient is taking Exforge.  He takes half dose.     If blood pressure goes up, it might be desirable to give him the     whole tablet. 5. Dyslipidemia.  The patient was on simvastatin and because of he is     on amlodipine the same time and increased risk of rhabdomyolysis,     that was changed to Crestor. 6. Hypothyroidism.  The patient on levothyroxine supplements.  His TSH     is 1.7.  No adjustment were made to his medication. 7. Vital signs:  Temperature is 98.2, pulse is 55, respiration is 18,     blood pressure is 113/75, O2 sats 94% on room air. 8. Lipid profile total 146, TGL 59, HDL 31, LDL is 103.     Clydia Llano, MD     ME/MEDQ  D:  05/16/2010  T:  05/16/2010  Job:  [id number]  cc:   Ernestina Penna, M.D.  Electronically Signed by Clydia Llano  on 05/25/2010 08:18:17 PM

## 2010-05-26 ENCOUNTER — Encounter: Payer: Self-pay | Admitting: Family Medicine

## 2010-05-26 DIAGNOSIS — J45909 Unspecified asthma, uncomplicated: Secondary | ICD-10-CM | POA: Insufficient documentation

## 2010-05-26 DIAGNOSIS — E559 Vitamin D deficiency, unspecified: Secondary | ICD-10-CM | POA: Insufficient documentation

## 2010-05-26 DIAGNOSIS — I10 Essential (primary) hypertension: Secondary | ICD-10-CM

## 2010-05-26 DIAGNOSIS — E785 Hyperlipidemia, unspecified: Secondary | ICD-10-CM | POA: Insufficient documentation

## 2010-05-26 LAB — PROTIME-INR
INR: 2.56 — ABNORMAL HIGH (ref 0.00–1.49)
Prothrombin Time: 27.6 seconds — ABNORMAL HIGH (ref 11.6–15.2)

## 2010-05-26 LAB — GLUCOSE, CAPILLARY: Glucose-Capillary: 80 mg/dL (ref 70–99)

## 2010-05-27 LAB — PROTIME-INR
INR: 2.91 — ABNORMAL HIGH (ref 0.00–1.49)
Prothrombin Time: 30.5 seconds — ABNORMAL HIGH (ref 11.6–15.2)

## 2010-05-27 LAB — GLUCOSE, CAPILLARY: Glucose-Capillary: 89 mg/dL (ref 70–99)

## 2010-05-28 LAB — PROTIME-INR
INR: 2.83 — ABNORMAL HIGH (ref 0.00–1.49)
Prothrombin Time: 29.8 seconds — ABNORMAL HIGH (ref 11.6–15.2)

## 2010-05-28 LAB — GLUCOSE, CAPILLARY
Glucose-Capillary: 107 mg/dL — ABNORMAL HIGH (ref 70–99)
Glucose-Capillary: 103 mg/dL — ABNORMAL HIGH (ref 70–99)

## 2010-05-29 LAB — PROTIME-INR
INR: 2.11 — ABNORMAL HIGH (ref 0.00–1.49)
Prothrombin Time: 23.8 seconds — ABNORMAL HIGH (ref 11.6–15.2)

## 2010-05-29 LAB — GLUCOSE, CAPILLARY: Glucose-Capillary: 99 mg/dL (ref 70–99)

## 2010-05-30 DIAGNOSIS — Z5189 Encounter for other specified aftercare: Secondary | ICD-10-CM

## 2010-05-30 DIAGNOSIS — I6330 Cerebral infarction due to thrombosis of unspecified cerebral artery: Secondary | ICD-10-CM

## 2010-05-30 LAB — URINALYSIS, MICROSCOPIC ONLY
Bilirubin Urine: NEGATIVE
Glucose, UA: NEGATIVE mg/dL
Protein, ur: 30 mg/dL — AB
Specific Gravity, Urine: 1.018 (ref 1.005–1.030)
Urobilinogen, UA: 4 mg/dL — ABNORMAL HIGH (ref 0.0–1.0)

## 2010-05-30 LAB — GLUCOSE, CAPILLARY
Glucose-Capillary: 93 mg/dL (ref 70–99)
Glucose-Capillary: 91 mg/dL (ref 70–99)
Glucose-Capillary: 89 mg/dL (ref 70–99)

## 2010-05-30 LAB — PROTIME-INR: INR: 1.83 — ABNORMAL HIGH (ref 0.00–1.49)

## 2010-05-31 LAB — URINE CULTURE
Colony Count: >=100000
Culture  Setup Time: 201203261548

## 2010-05-31 LAB — GLUCOSE, CAPILLARY: Glucose-Capillary: 104 mg/dL — ABNORMAL HIGH (ref 70–99)

## 2010-06-01 DIAGNOSIS — I6330 Cerebral infarction due to thrombosis of unspecified cerebral artery: Secondary | ICD-10-CM

## 2010-06-01 DIAGNOSIS — Z5189 Encounter for other specified aftercare: Secondary | ICD-10-CM

## 2010-06-01 LAB — PROTIME-INR
INR: 1.79 — ABNORMAL HIGH (ref 0.00–1.49)
Prothrombin Time: 21 seconds — ABNORMAL HIGH (ref 11.6–15.2)

## 2010-06-01 LAB — CBC
HCT: 46.2 % (ref 39.0–52.0)
Hemoglobin: 15.8 g/dL (ref 13.0–17.0)
RBC: 4.88 MIL/uL (ref 4.22–5.81)
WBC: 5.2 10*3/uL (ref 4.0–10.5)

## 2010-06-01 LAB — BASIC METABOLIC PANEL
BUN: 31 mg/dL — ABNORMAL HIGH (ref 6–23)
Chloride: 101 mEq/L (ref 96–112)
GFR calc Af Amer: >60 mL/min (ref >60)
GFR calc non Af Amer: >60 mL/min (ref >60)
Potassium: 4.6 mEq/L (ref 3.5–5.1)
Sodium: 134 mEq/L — ABNORMAL LOW (ref 135–145)

## 2010-06-01 LAB — GLUCOSE, CAPILLARY
Glucose-Capillary: 95 mg/dL (ref 70–99)
Glucose-Capillary: 88 mg/dL (ref 70–99)

## 2010-06-01 NOTE — Consult Note (Signed)
NAME:  CUSTER, PIMENTA NO.:  [id number]  MEDICAL RECORD NO.:  [id number]           PATIENT TYPE:  LOCATION:                                 FACILITY:  PHYSICIAN:  Dr. Hoy Morn    DATE OF BIRTH:  04/25/1945  DATE OF CONSULTATION: DATE OF DISCHARGE:                                CONSULTATION   REASON FOR CONSULTATION:  Right-sided weakness, possibility of a stroke.  Last seen normal is 11 p.m. on May 12, 2010.  NIH stroke scale of 6. Modified Rankin 0.  IV t-PA was not given as the patient was delayed in arrival.  HISTORY OF PRESENT ILLNESS:  This is a 64 year old obese African American male with past medical history of hypertension, multinodular goiter, atrial fibrillation, not on Coumadin, gouty arthritis, and asthma.  The patient states he was at his baseline on the night of May 11, 2010.  He went to sleep at approximately 11 p.m. and was feeling full strength in all four extremities.  He awoke around 1:30 to 2 a.m. on May 12, 2010.  As he was getting out of bed, he noted that his right arm and leg were extremely weak.  As he tried to get out of bed, he felt to the floor and was unable to support himself with his right arm and leg.  The patient himself called 911 and EMS brought him to [facility] ED. Unfortunately, the patient's weight is too much to have an MRI but was placed under the CT scanner which showed no acute infarct.  Neurology was consulted to further evaluate the patient.  By the time of evaluation, the patient was placed back on Coumadin.  He continued to have right arm and leg weakness.  No sensory deficits.  No facial asymmetry or speech difficulties.  The patient passed his stroke swallow screen without any difficulty.  PAST MEDICAL HISTORY:  Hypertension, morbid obesity, atrial fibrillation not on chronic anticoagulation, multinodular goiter, gouty arthritis, asthma/  Stroke risk factors of obesity, atrial fibrillation,  hypertension, morbid obesity.  MEDICATIONS:  At home, the patient is on aspirin 325 mg daily, Celebrex, colchicine, Exforge, Lasix, Klor-Con, levothyroxine, metoprolol, Ramipril, simvastatin, Symbicort, and vitamin D.  While hospitalized, the patient was placed on Coumadin, Zofran, aspirin, allopurinol, simvastatin, Klor-Con, colchicine, Exforge.  ALLERGIES:  No known drug allergies.  FAMILY HISTORY:  Mother died at age of 29, she did have heart trouble and hypertension.  Father died at age 69s secondary to accident.  SOCIAL HISTORY:  The patient lives at home with his wife.  He uses a cane to ambulate.  He does not drink, smoke, or do illicit drugs.  REVIEW OF SYSTEMS:  All 10 review of systems were negative with the exception of what was mentioned.  PHYSICAL EXAMINATION:  VITAL SIGNS:  Temperature is 98.3, blood pressure is 166/96, heart rate 74, respiration 18, O2 sats 100% on room air. LUNGS:  Clear to auscultation bilaterally. ABDOMEN:  Soft, nontender, extremely protuberant. NECK:  Negative for bruits and supple. CARDIOVASCULAR:  Irregularly irregular.  S1 and S2 is audible. SKIN:  Warm, dry, and intact. NEUROLOGIC:  Pupils are equal, round, and reactive to light and accommodating.  Extraocular movements are intact.  Tongue is midline. Face is equal and symmetrical.  Speech is clear.  Facial sensation from V1 and V3 is grossly intact bilaterally.  Finger-to-nose on the left smooth.  Heel-to-shin on the left showed no dysmetria; however, the patient secondary to body habitus was unable to actually place his heel on his shin.  Finger-to-nose was unable to be obtained on the right and heel-to-shin was unable to retained on the right secondary to flaccidity.  The patient's musculoskeletal strength was 5/5 on the left upper and lower extremities.  The patient was unable to grip with his right thumb, flex or extend his right elbow, abduct his right arm, and he showed flaccidity  in his right arm.  The patient's right leg again showed no movement against gravity in the city.  Deep tendon reflexes were depressed throughout and he had equivocal toes bilaterally.  The patient showed extreme dependent edema in his lower extremities along with hemosiderin staining of bilateral ankles.  LABORATORY DATA:  Sodium 135, potassium 4.1, chloride 103, CO2 is 27, BUN 10, creatinine 0.86, glucose is 100.  White blood cell count 4.0, platelets 110,000, hemoglobin 14.8, hematocrit 44.  INR is 1.03.  PT is 13.7, cholesterol 146, triglycerides 59, HDL 31, LDL 103.  A1c is 5.8.  EKG shows atrial fibrillation.  CT of head shows negative for acute infarct.  Bilateral carotid Dopplers were negative.  A 2-D echo was limited but showed no source of emboli.  ASSESSMENT:  This is a 65 year old morbidly obese African American male with known atrial fibrillation who was on no anticoagulation now presenting to [facility] with sudden onset of right-sided hemiparesis. Although CT of head is negative, most likely the patient suffered from a left brain stroke.  At this time, we would recommend: 1. PT and OT. 2. Continue with Coumadin with INR goal of 2-3. 3. CTA of head and neck and repeat CT of head. 4. I have discussed all these findings with Dr. Hoy Morn and he is in     agreement at this time.     Felicie Morn, PA-C   ______________________________ Levie Heritage, MD    DS/MEDQ  D:  05/13/2010  T:  05/14/2010  Job:  [id number]  cc:   Levie Heritage, MD Royce Sciara P. Pearlean Brownie, MD  Electronically Signed by Felicie Morn PA-C on 05/30/2010 03:49:16 PM Electronically Signed by Delia Heady MD on 06/01/2010 11:07:44 AM

## 2010-06-02 LAB — PROTIME-INR: Prothrombin Time: 23.1 seconds — ABNORMAL HIGH (ref 11.6–15.2)

## 2010-06-02 LAB — GLUCOSE, CAPILLARY: Glucose-Capillary: 93 mg/dL (ref 70–99)

## 2010-06-03 DIAGNOSIS — I6330 Cerebral infarction due to thrombosis of unspecified cerebral artery: Secondary | ICD-10-CM

## 2010-06-03 DIAGNOSIS — Z5189 Encounter for other specified aftercare: Secondary | ICD-10-CM

## 2010-06-03 LAB — BASIC METABOLIC PANEL
BUN: 27 mg/dL — ABNORMAL HIGH (ref 6–23)
Calcium: 9.5 mg/dL (ref 8.4–10.5)
Chloride: 97 mEq/L (ref 96–112)
Creatinine, Ser: 1.05 mg/dL (ref 0.4–1.5)
GFR calc Af Amer: >60 mL/min (ref >60)
GFR calc non Af Amer: >60 mL/min (ref >60)

## 2010-06-03 LAB — GLUCOSE, CAPILLARY: Glucose-Capillary: 115 mg/dL — ABNORMAL HIGH (ref 70–99)

## 2010-06-04 LAB — GLUCOSE, CAPILLARY
Glucose-Capillary: 109 mg/dL — ABNORMAL HIGH (ref 70–99)
Glucose-Capillary: 104 mg/dL — ABNORMAL HIGH (ref 70–99)

## 2010-06-04 LAB — PROTIME-INR: Prothrombin Time: 27.5 seconds — ABNORMAL HIGH (ref 11.6–15.2)

## 2010-06-05 LAB — PROTIME-INR: INR: 2.04 — ABNORMAL HIGH (ref 0.00–1.49)

## 2010-06-05 LAB — GLUCOSE, CAPILLARY: Glucose-Capillary: 93 mg/dL (ref 70–99)

## 2010-06-06 DIAGNOSIS — I6330 Cerebral infarction due to thrombosis of unspecified cerebral artery: Secondary | ICD-10-CM

## 2010-06-06 DIAGNOSIS — Z5189 Encounter for other specified aftercare: Secondary | ICD-10-CM

## 2010-06-06 LAB — BASIC METABOLIC PANEL
CO2: 27 mEq/L (ref 19–32)
Calcium: 9.4 mg/dL (ref 8.4–10.5)
Creatinine, Ser: 0.99 mg/dL (ref 0.4–1.5)
GFR calc Af Amer: >60 mL/min (ref >60)
GFR calc non Af Amer: >60 mL/min (ref >60)
Glucose, Bld: 105 mg/dL — ABNORMAL HIGH (ref 70–99)

## 2010-06-06 LAB — GLUCOSE, CAPILLARY

## 2010-06-06 LAB — PROTIME-INR: Prothrombin Time: 24.1 seconds — ABNORMAL HIGH (ref 11.6–15.2)

## 2010-06-07 LAB — GLUCOSE, CAPILLARY: Glucose-Capillary: 105 mg/dL — ABNORMAL HIGH (ref 70–99)

## 2010-06-08 LAB — GLUCOSE, CAPILLARY: Glucose-Capillary: 100 mg/dL — ABNORMAL HIGH (ref 70–99)

## 2010-06-09 LAB — CBC
Hemoglobin: 14.3 g/dL (ref 13.0–17.0)
MCH: 31.9 pg (ref 26.0–34.0)
MCV: 93.8 fL (ref 78.0–100.0)
RBC: 4.48 MIL/uL (ref 4.22–5.81)

## 2010-06-09 LAB — GLUCOSE, CAPILLARY
Glucose-Capillary: 90 mg/dL (ref 70–99)
Glucose-Capillary: 77 mg/dL (ref 70–99)

## 2010-06-09 LAB — PROTIME-INR
INR: 2.32 — ABNORMAL HIGH (ref 0.00–1.49)
Prothrombin Time: 25.6 seconds — ABNORMAL HIGH (ref 11.6–15.2)

## 2010-06-10 DIAGNOSIS — Z5189 Encounter for other specified aftercare: Secondary | ICD-10-CM

## 2010-06-10 DIAGNOSIS — I6330 Cerebral infarction due to thrombosis of unspecified cerebral artery: Secondary | ICD-10-CM

## 2010-06-10 LAB — GLUCOSE, CAPILLARY: Glucose-Capillary: 102 mg/dL — ABNORMAL HIGH (ref 70–99)

## 2010-07-06 NOTE — Discharge Summary (Signed)
NAME:  Omar [name], Omar [name] NO.:  [id number]  MEDICAL RECORD NO.:  [id number]          PATIENT TYPE:  LOCATION:                                 FACILITY:  PHYSICIAN:  Ranelle Oyster, M.D.DATE OF BIRTH:  25-Feb-1946  DATE OF ADMISSION: DATE OF DISCHARGE:                              DISCHARGE SUMMARY   DISCHARGE DIAGNOSES: 1. Left posterior limb internal capsule and corona radiata infarct. 2. Morbid obesity. 3. Atrial fibrillation. 4. Dyslipidemia. 5. Impaired fasting glucose.  HISTORY OF PRESENT ILLNESS:  Omar [name] is a 65 year old male with history of morbid obesity, AFib, admitted on May 12, 2010, with right-sided weakness and fall.  CT of head done showed no acute abnormality.  MRI could not be done due to body habitus.  CTA head and neck showed occluded left ICA at origin with reconstitution at carotid terminus level and small vessel disease without acute infarct.  Neuro evaluated the patient and felt the patient with subcortical CVA.  Coumadin was recommended for CVA prophylaxis.  A 2-D echo done showed LV function probably good, wall thickness normal, study very limited.  Carotid Dopplers done showed no significant extracranial carotid artery stenosis, study was limited due to body habitus.  The patient currently continues with dense right hemiparesis as well as mild right facial weakness with oral leakage.  Noted to have difficulty with multi-step reasoning tasks.  The patient was evaluated by rehab, and we felt that he would benefit from a CIIR program.  PAST MEDICAL HISTORY:  Significant for: 1. Right thyroid lobectomy in 2004. 2. Hypertension. 3. AFib. 4. Gout. 5. Bilateral knee OA, left greater than right. 6. Morbid obesity.  REVIEW OF SYMPTOMS:  Positive for incontinence of bladder, weakness as well as numbness on the right side.  ALLERGIES:  ANACIN, LIPITOR, and NIASPAN.  FAMILY HISTORY:  Positive for CVA.  SOCIAL HISTORY:  The  patient is married.  He is a retired Curator. Quit tobacco 14 years ago.  Uses alcohol rarely.  Family is supportive and is to assist past discharge.  FUNCTIONAL HISTORY:  The patient was independent prior to admission.  FUNCTIONAL STATUS:  The patient is mod to max assist for upper body care, total assist lower body care, +2 total assist to sit at the edge of bed, unable to stand currently.  PHYSICAL EXAMINATION:  VITAL SIGNS:  Blood pressure 115/72, pulse 84, respiratory rate 20, temperature 98.1. GENERAL:  The patient is pleasant male, obese, lying in bed, in no acute distress. HEENT:  Oral mucosa is pink and moist with multiple missing teeth. Pupils equal, round, and reactive to light. NECK:  Supple without JVD or lymphadenopathy. LUNGS:  Clear to auscultation bilaterally without wheezes, rales, or rhonchi. HEART:  Regular rate and rhythm without murmurs, gallops, or rubs. ABDOMEN:  Soft, nontender with positive bowel sounds. EXTREMITIES:  Chronic vascular changes in bilateral lower extremity with 1+ pitting edema. NEUROLOGIC:  Cranial nerves II through XII notable for some mild right facial weakness.  No sensory loss with more attention to left side with peripheral field testing.  Sensation is grossly intact to pinprick and light  touch throughout the right arm and right leg today.  Reflexes 1+, no resting tone.  Strength is 0/5 in right upper extremity proximal to distal.  Right hip flexors and abductors are trace to 1/5.  No active movement at knee or ankle.  Left upper extremity and left lower extremity is generally 4-5/5 proximal to distal.  Judgment, orientation, memory seem to be fairly appropriate.  Mood is pleasant.  HOSPITAL COURSE:  Omar [name] was admitted to rehab on May 16, 2010, for inpatient therapies to consist of PT, OT, and speech therapy at least 3 hours 5 days a week.  Past admission physiatrist rehab RN and therapy team have worked together to  provide customized collaborative interdisciplinary care.  Rehab RN has worked with the patient on bowel and bladder program as well as closely monitoring of skin to prevent breakdown.  The patient's blood pressures were checked on b.i.d. basis during this stay.  These have been reasonably controlled ranging from 110s to 130s systolics, 60s to 70s diastolic.  The patient has been continent of bladder with scheduled toileting.  The patient was noted to have hemoglobin A1c at 5.8, and therefore, CBGs were checked on a.c. and nightly basis initially.  The patient was educated regarding carb- modified diet.  Currently, blood sugars are being checked on b.i.d. basis and are ranging from 80-100 range.  Routine labs done past admission revealed hemoglobin 16.5, hematocrit 47.2, white count 7.6, platelets 129.  Repeat CBC of May 25, 2010, reveals thrombocytopenia to have resolved, however, repeat CBC of June 09, 2010, shows platelets back down to 126.  White count 3.5, H and H at 14.3 and 42.0.  Check of lytes last of June 06, 2010, revealed sodium 135, potassium 4.1, chloride 101, CO2 is 27, BUN 23, creatinine 0.99, glucose 105.  PT/INR at the time of discharge is stable at 25.6 and 2.32.  The patient is discharged to home on Coumadin 5 mg on Monday, Wednesday, and Friday and 4 mg on all other days.  Home health RN to draw pro times on June 13, 2010, with results to [facility].  The patient did have a syncopal episode during his stay.  Check of lytes showed some mild dehydration with BUN 24, creatinine at 1.2, and he was encouraged to push p.o. fluids.  Neuro was consulted for input per family request. Repeat CT was done on May 19, 2010, showing acute nonhemorrhagic infarct that was now visualized in left internal capsule with extension into posterior aspect of left corona radiata.  Neurology felt it would be expected that its appearance would lag behind in clinical symptoms and  location was consistent with the patient's right hemiparesis.  The patient is to continue on Coumadin for now.  His EEG was also done on May 19, 2010, showing intermittent left hemisphere slowing consistent with focal disturbance that is nonspecific but consistent with the patient's history of stroke.  No epileptiform activity noted.  During the patient's stay in rehab, weekly team conferences were held to monitor the patient's progress, set goals as well as discuss barriers to discharge.  Therapy evaluation at admission revealed the patient with decreased strength, decreased balance, decreased endurance as well as decreased mobility.  He was limited by his right dense hemiparesis as well as his morbid obesity.  At admission, the patient required +2 assist for all mobility including maximal for transfers from bed to chair.  He was unable to stand or scoot with +2 assist.  Currently,  he has made good progress and currently has improved his participation with functional activities from 25% to 60%.  He is currently able to do sliding board transfers with therapy.  He is min assist to scoot at the edge of bed with max assist to help with the right lower extremity muscle activation.  The family was educated about the total care that would be needed past discharge and options regarding SNF for further therapies were discussed.  However, family has been very supportive and will continue to provide care that is needed.  The patient's daughter and son-in-law have been educated regarding doing Nurse, adult transfers past admission as well as education regarding a positioning in bed and chair.  Occupational Therapy has worked with the patient on self-care tasks.  Initially, the patient was total assist for all ADL tasks. Currently, he is min assist for upper body dressing, is modified independent for dynamic sitting balance for ADL tasks, mod assist for rolling in bed, for hygiene and max assist for  sliding board transfers. Speech Therapy evaluation at admission revealed the patient with moderate cognitive deficits characterized by decreased awareness, decreased attention issues with short-term memory recall, as well as some verbal expressive problems characterized by word-finding difficulty as well as decreased speech intelligibility due to lingual and labial weakness.  Speech Therapy has been working with the patient on oromotor exercises, as well as speech strategies, to improve clarity of speech. They have also worked with the patient on complex level tasks as well as working on Pharmacologist.  Currently, the patient is supervision to modified independent for speech intelligibility at conversation level with utilization of strategies.  He is supervision to modified independent for complex problem solving, attention as well as awareness which improved short-term memory recall.  Further followup home health PT, OT, speech therapy to continue by [facility] past discharge. On June 10, 2010, the patient is discharged to home.  DISCHARGE MEDICATIONS: 1. Norvasc 5 mg p.o. per day. 2. Neurontin 300 mg p.o. b.i.d. and 600 mg nightly. 3. Claritin 10 mg a day. 4. Metoprolol 100 mg p.o. half pill b.i.d. 5. MiraLax one scoop per day. 6. Valsartan 320 mg p.o. per day. 7. Coumadin 4 mg on Tuesday, Thursday, Saturday, and Sunday and 5 mg     on Monday, Wednesday, Friday. 8. Zocor 40 mg nightly. 9. K-Chlor 20 mEq half p.o. per day. 10.Allopurinol 300 mg p.o. per day. 11.Coated aspirin 81 mg p.o. per day. 12.Colchicine 0.6 mg p.r.n. flare-ups. 13.Vitamin D 50,000 p.o. per day. 14.Synthroid 125 mcg p.o. per day.  DIET:  Carb modified, medium.  ACTIVITY LEVEL:  A 24-hour supervision and assistance.  SPECIAL INSTRUCTIONS:  No alcohol, no smoking, no strenuous activity. Do not use Altace, Exforge, or furosemide.  Follow [facility] to provide PT, OT, and speech therapy.   Maintain a carb-modified diet.  May use Tylenol as needed for pain or headaches.  FOLLOWUP:  The patient is to follow up with Dr. Riley Kill on Aug 02, 2010, at 11:10 for 11:50 appointment, follow up with Dr. Pearlean Brownie in 6 weeks, follow up with Dr. Antoine Poche and Dr. Rudi Heap in the next few weeks.     Delle Reining, P.A.   ______________________________ Ranelle Oyster, M.D.    PL/MEDQ  D:  06/10/2010  T:  06/11/2010  Job:  [id number]  cc:   Ernestina Penna, M.D. Rollene Rotunda, MD, [facility] Pramod P. Pearlean Brownie, MD  Electronically Signed by Osvaldo Shipper. on  06/23/2010 10:47:42 AM Electronically Signed by Faith Rogue M.D. on 07/06/2010 09:44:33 PM

## 2010-07-19 NOTE — Assessment & Plan Note (Signed)
[facility]                            CARDIOLOGY OFFICE NOTE   NAME:Omar [name], Omar [name]                       MRN:          [id number]  DATE:11/26/2006                            DOB:          23-Feb-1946    REFERRING PHYSICIAN:  Ernestina Penna, M.D.   REASON FOR CONSULTATION:  Evaluate patient with atrial fibrillation.   HISTORY OF PRESENT ILLNESS:  I saw this patient back in 2000 and 2001  for evaluation of hypertension.  He was recently seen by Dr. Christell Constant and  noted to be in atrial fibrillation.  This is a new finding.  The patient  was not noticing any palpitations.  He has had no presyncope or syncope.  He has had no change in his exercise tolerance.  He gets dyspneic with  moderate exertion, though this is not new.  He has no resting shortness  of breath.  He denies any PND or orthopnea.  He denies any chest  discomfort, neck or arm discomfort.   PAST MEDICAL HISTORY:  1. Hypertension times approximately 20 years.  2. Multinodular goiter.  3. Morbid obesity.   PAST SURGICAL HISTORY:  Thyroid surgery in 2005.   ALLERGIES/INTOLERANCES:  1. ANACIN.  2. LIPITOR.  3. NIASPAN.   MEDICATIONS:  1. Furosemide 40 mg daily.  2. Colchicine 0.6 mg every other day.  3. Metoprolol 50 mg daily.  4. Allopurinol 300 mg daily.  5. Ramipril 10 mg daily.  6. Meloxicam 15 mg every other day.  7. Potassium 20 mEq daily.  8. Levothyroxine 125 mcg daily.  9. Aspirin 81 mg daily.   SOCIAL HISTORY:  The patient is retired from his job as a Curator at  [employer].  He is married.  He has 1 child.  He stopped smoking  cigarettes about 12 years ago.   FAMILY HISTORY:  Is contributory for a brother with early coronary  artery disease.  He does not know the details of this.   REVIEW OF SYSTEMS:  As stated in the HPI and otherwise positive for  gout.  Positive for lower extremity swelling.  Positive for weight gain  as he does not walk as much as he used  to.  Negative for all other  systems.   PHYSICAL EXAMINATION:  The patient is pleasant and in no distress.  Blood pressure 140/100, heart rate 76 and regular, body mass index 59,  weight 376 pounds.  HEENT:  Eyes unremarkable, pupils equal, round and react to light, fundi  within normal limits, oral mucosa unremarkable.  NECK:  No obvious jugular venous distention, wave form within normal  limits, carotid upstroke brisk and symmetric, no bruits,  no  thyromegaly.  LYMPHATICS:  There is no obvious cervical, axillary or inguinal  adenopathy though the exam is very compromised.  LUNGS:  Clear to auscultation bilaterally.  BACK:  No costovertebral angle tenderness.  CHEST:  Unremarkable.  HEART:  PMI not displaced or sustained, S1 and S2 within normal limits,  no S3, no clicks, no rubs, no murmurs.  ABDOMEN:  Morbidly obese, positive bowel sounds normal in  frequency and  pitch, no bruits, no rebound, no guarding, no midline pulsatile mass, no  hepatomegaly, no splenomegaly.  SKIN:  No rashes, no nodules.  EXTREMITIES:  Pulse 2+ throughout, moderate bilateral lower extremity  edema, chronic venostasis changes, no cyanosis, no clubbing.  NEURO:  Oriented to person, place and time, cranial nerves II through  XII grossly intact, motor grossly intact.   EKG:  Atrial fibrillation, left axis deviation, left anterior fascicular  block, poor anterior R wave progression, no acute ST-T wave changes.   ASSESSMENT AND PLAN:  1. Atrial fibrillation.  The patient has asymptomatic atrial      fibrillation.  I am going to take the liberty of increasing his      beta-blocker slightly for blood pressure control as well as rate      control.  I am going to increase this to 100 mg daily.  I will      check with Dr. Christell Constant, as I am sure he has had labs drawn to include      a TSH.  I am going to start him on Coumadin.  He will be educated      about this.  He will be given Coumadin education material and       referred to [place] for Coumadin checks.  He has no      contraindications to this.  For now I am going to plan rate control      and anticoagulation.  I am going to check an echocardiogram.  I      suspect that his risk for thromboembolism will be in the moderate      range necessitating long term Coumadin, but I will make a comment      on this after the echocardiogram.  2. Morbid obesity.  We had a long discussion about this.  This is a      very significant health problem.  I have tried to make this simple      and told him to drink skim milk only and to stop eating bread.  3. Followup.  I would like to see him back in about a month.  At that      point I will have the results of the echocardiogram.     Rollene Rotunda, MD, [facility]  Electronically Signed    JH/MedQ  DD: 11/26/2006  DT: 11/26/2006  Job #: [id number]   cc:   Ernestina Penna, M.D.

## 2010-07-19 NOTE — Assessment & Plan Note (Signed)
[facility]                            CARDIOLOGY OFFICE NOTE   NAME:Omar Kitchen SCOT [name]                       MRN:          [id number]  DATE:12/11/2007                            DOB:          [birth year]    PRIMARY:  Ernestina Penna, MD   REASON FOR PRESENTATION:  Evaluate the patient with atrial fibrillation.   HISTORY OF PRESENT ILLNESS:  The patient is 65 years old.  He returns  for followup of the above.  Since I last saw him, he has had no new  complaints.  He does not notice any palpitations.  He has no presyncope  or syncope.  He denies any chest discomfort, neck, or arm discomfort.  He has no new shortness of breath.  He is limited by knee pain and does  not get out and do an awful lot.  In fact, he has been limited more  recently by his wife who has had some orthopedic problems and he has  been in the house taking care of her.  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  He has had some mild lower extremity swelling and takes a  Lasix occasionally for this.   PAST MEDICAL HISTORY:  Hypertension times approximately 20 years,  multinodular goiter, morbid obesity, thyroid surgery in 2005.   ALLERGIES INTOLERANCES:  ANACIN, LIPITOR, and NIASPAN.   MEDICATIONS:  1. Lopressor 100 mg daily.  2. Celebrex 200 mg daily.  3. Furosemide 40 mg daily.  4. Colchicine p.r.n.  5. Allopurinol 300 mg daily.  6. Ramipril 10 mg daily.  7. Potassium 20 mEq p.o. daily.  8. Levothyroxine 125 mcg daily.  9. Aspirin 325 mg daily.  10.Exforge 10/320 one-half tablet daily.  11.Simvastatin 20 mg every other day.   REVIEW OF SYSTEMS:  As stated in the HPI, otherwise negative for other  systems.   PHYSICAL EXAMINATION:  GENERAL:  The patient is in no distress.  VITAL SIGNS:  Blood pressure 120/84, heart rate 72 and irregular, weight  388 pounds.  HEENT:  Eyelids unremarkable; pupils  equal, round, and reactive to  light; fundi not visualized.  NECK:  There is no jugular distention at 90 degrees, (the patient is  seated during this exam).  The neck exam is compromised by his weight.  LUNGS:  Clear to auscultation bilaterally.  HEART:  PMI is not displaced or sustained, although it is difficult to  appreciate; S1 and S2 within normal limits; no S3, no murmurs.  ABDOMEN:  Morbidly obese; positive bowel sounds without rebound or  guarding; I would be unable to appreciate organomegaly, midline  pulsatile mass, or bruit.  SKIN:  There are no rashes or nodules.  EXTREMITIES:  He has 2+ pulses and mild bilateral lower extremity edema.  NEURO:  Grossly intact.   EKG atrial fibrillation, axis within normal limits, intervals within  normal limits, poor anterior R-wave progression, no acute ST-T wave  changes.   ASSESSMENT AND PLAN:  1. Atrial fibrillation.  The  patient has atrial fibrillation with a      controlled rate.  I have evaluated him, and he has a well-preserved      ejection fraction.  He had no significant valvular abnormalities.      He really has a CHADS1 score with his hypertension.  He is not yet      at an advanced age, and he does not have diabetes.  He has no other      risk factors.  We talked about this at length.  He understands the      risks and benefits of Coumadin.  If there is any possibility to      stay off that, he prefers this, and I think that is reasonable for      now.  Certainly, if he is ever diagnosed with diabetes, I would      push him into a level 2, and I would suggest Coumadin at that      point.  As he ages, he would also have an indication for Coumadin.  2. Obesity.  This is really his most significant problem.  He      understands this and unfortunately cannot seem to get a handle on      this.  He will continue to hopefully work on this.  3. Hypertension.  Blood pressure is controlled, and he will follow      with Dr. Christell Constant.   4. Followup.  I will see him back in 1 year or sooner if needed.     Rollene Rotunda, MD, [facility]  Electronically Signed    JH/MedQ  DD: 12/11/2007  DT: 12/12/2007  Job #: [id number]   cc:   Ernestina Penna, M.D.

## 2010-07-19 NOTE — Assessment & Plan Note (Signed)
[facility]                            CARDIOLOGY OFFICE NOTE   NAME:Omar [name], Omar [name]                       MRN:          [id number]  DATE:01/02/2007                            DOB:          November 24, 1945    PRIMARY CARE PHYSICIAN:  Ernestina Penna, M.D.   REASON FOR PRESENTATION:  Evaluate patient with atrial fibrillation.   HISTORY OF PRESENT ILLNESS:  The patient presents for evaluation of his  atrial fibrillation.  At the last visit, we settled on rate control and  discussed anticoagulation.  I temporarily started him on Coumadin until  I evaluated his cardioembolic risk.  He did have an echocardiogram,  which demonstrates a well-preserved left ventricular function.  There is  some mild left ventricular hypertrophy, but otherwise no significant  abnormalities.  Left atrium was mildly to moderately dilated.  I had a  Holter placed on the patient and he had atrial fibrillation with a well-  controlled rate, occasional PVC.  He had no sustained bradyarrhythmias.   Since that time, he has had no new complaints.  He denies any  palpitations, presyncope or syncope.  He has had no chest pain, no  shortness of breath.   PAST MEDICAL HISTORY:  Hypertension times approximately 20 years,  multinodular goiter, morbid obesity, thyroid surgery in 2005.   ALLERGIES:  ANACIN, LIPITOR and NIASPAN.   MEDICATIONS:  1. Furosemide 40 mg daily.  2. Colchicine 0.6 mg daily.  3. Metoprolol 50 mg daily.  4. Allopurinol 300 mg daily.  5. Ramipril 10 mg daily.  6. Celebrex 100 mg daily.  7. Potassium 20 mEq daily.  8. Levothyroxine 125 micrograms daily.  9. Aspirin 81 mg daily.   REVIEW OF SYSTEMS:  As stated in the HPI, and otherwise negative for  other systems.   PHYSICAL EXAMINATION:  The patient is in no distress.  Blood pressure  130/96, heart rate 80 and regular.  Weight 350+ pounds.  HEENT:  Eyelids unremarkable.  Pupils equal, round and reactive to  light.  Fundi not visualized.  NECK:  No jugular venous distention, wave form within normal limits.  Carotid upstroke brisk and symmetric.  No bruits, no thyromegaly.  LYMPHATICS:  No adenopathy.  LUNGS:  Clear to auscultation bilaterally.  CHEST:  Unremarkable.  HEART:  PMI not displaced or sustained.  S1 and S2 within normal limits.  No S3, no murmurs.  ABDOMEN:  Morbidly obese, positive bowel sounds, normal in frequency and  pitch.  No bruits, no rebound, no guarding, no midline pulsatile mass,  no organomegaly.  EXTREMITIES:  Two-plus pulses, moderate bilateral lower extremity edema.   ASSESSMENT AND PLAN:  1. Atrial fibrillation:  Patient has reasonable rate control.  He does      have hypertension as his only [place] risk.  I have reviewed this with      him extensively.  This puts him in the area that does not need      anticoagulation, according to [facility] AHA guidelines.  However, he      needs to switch to a 325 mg aspirin.  He will stop the Coumadin.      We will reassess this risk on a yearly basis.  2. Hypertension:  He needs excellent control of his blood pressure.      This will necessitate control of his weight.  This will be followed      by Dr. Christell Constant.  3. Followup:  I will see him back in six months.     Rollene Rotunda, MD, [facility]  Electronically Signed    JH/MedQ  DD: 01/02/2007  DT: 01/02/2007  Job #: [id number]   cc:   Ernestina Penna, M.D.

## 2010-07-22 NOTE — Op Note (Signed)
NAME:  Omar [name], Omar [name] NO.:  [id number]   MEDICAL RECORD NO.:  [id number]                   PATIENT TYPE:  OIB   LOCATION:  2899                                 FACILITY:  [facility]   PHYSICIAN:  Angelia Mould. Derrell Lolling, M.D.             DATE OF BIRTH:  October 22, 1945   DATE OF PROCEDURE:  08/13/2002  DATE OF DISCHARGE:                                 OPERATIVE REPORT   PREOPERATIVE DIAGNOSIS:  Multinodular goiter with 5 cm nodule of right  thyroid lobe.   POSTOPERATIVE DIAGNOSIS:  Multinodular goiter.   OPERATION PERFORMED:  Right thyroid lobectomy, frozen section.   SURGEON:  Angelia Mould. Derrell Lolling, M.D.   FIRST ASSISTANT:  Gita Kudo, M.D.   OPERATIVE INDICATIONS:  This is a 65 year old black man with hypertension,  gout, morbid obesity (weight 370 pounds).  He has been followed for two or  three years because of multiple masses in both lobes of his thyroid gland,  which are thought to be due to a benign multinodular goiter.  He has been on  Synthroid during this period of time with a low-normal TSH level.  One of  the nodules in the right thyroid lobe has enlarged to 5 cm.  He has had  biopsies in the past which have been benign.  The patient has become anxious  about the right thyroid nodule and I also feel that the size of this nodule  is significant and carries some low, but definite risk of malignancy.  After  a lengthy discussion about options for intervention, the decision was made  to go ahead with thyroidectomy.   OPERATIVE TECHNIQUE:  Following the induction of general endotracheal  anesthesia, the patient was positioned with his arms at his side and his  neck extended and in a reversed Trendelenburg position.  The neck was  prepped and draped in a sterile fashion.  A transverse incision was made in  the neck midway between the cricoid cartilage and the suprasternal notch.  Dissection was carried down through the platysma muscle.  Skin and  platysma  muscle flaps were raised superiorly and inferiorly, exposing the strap  muscles widely.  A self-retaining retractor was placed.  One large vein in  the lower neck had to be isolated, clamped, divided, and ligated with 2-0  silk ties.  Strap muscles were identified.  The midline was identified.  We  divided the strap muscles in the midline.  We dissected the strap muscles  off of the left thyroid lobe.  The left thyroid lobe was relatively small  and slightly lumpy, but with no dominant mass or adenopathy noted.  We then  elevated the right strap muscles off of the right thyroid lobe and with  retractors exposed the very large mass in the inferior pole, which  essentially filled the inferior pole.  We mobilized the lower pole mostly by  blunt dissection.  A  few small venous channels were controlled with small  clips.  We dissected the thyroid gland medially somewhat.  We identified and  mobilized the superior pole of the thyroid gland.  We isolated the superior  pole vessels and these were ligated with 2-0 silk ties.  On the superior  end, we used a metal clip, as well as a 2-0 silk tie.  We had good  hemostasis here and this mobilized the superior pole quite nicely.   We then continued our medial to lateral dissection, staying right in the  capsule of the thyroid gland.  Venous channels and the inferior thyroid  artery were isolated with right angle retractors, secured with small metal  clips, and divided.  In this way we kept the dissection right on the thyroid  gland, preserving the parathyroid glands and avoiding injury to the  recurrent laryngeal nerve.  The dissection was quite smooth and easy and we  actually never were forced to identify the recurrent laryngeal nerve.  A  small 1 cm nodule was noted associated with the lower pole of the right  thyroid lobe, but this was completely separate from it.  We dissected this  away from the surrounding tissues.  It dissected quite  easily.  We sent this  for frozen section and it revealed benign thyroid tissue.  We further  mobilized the thyroid gland all the way across the midline at the isthmus.  We clamped the isthmus of the thyroid with hemostats and divided the thyroid  gland.  The isthmus was then suture ligated with 3-0 Vicryl suture  ligatures.   Frozen section of the right thyroid gland was performed by Dr. Clelia Croft and she  stated that this was a benign goiter.   Hemostasis was excellent and achieved with electrocautery, silk ties, and  small metal clips.  The wound was copiously irrigated with saline.  We  placed a piece of Surgicel gauze in the bed of the right thyroid and left  that in place for about 10 minutes.  After 10 minutes, it was completely  dry.  The left thyroid lobe was also completely dry.  We left the Surgicel  in place.  The strap muscles were closed in the midline with a running  suture of 3-0 Vicryl.  The platysmal muscle was closed with interrupted  sutures of 3-0 Vicryl.  The skin was closed with a few skin staples and  Steri-Strips.  Clean bandages were placed and the patient was taken to the  recovery room in stable condition.  The estimated blood loss was about 30-40  mL.  No complications.  The sponge, needle, and instrument counts were  correct.                                               Angelia Mould. Derrell Lolling, M.D.    HMI/MEDQ  D:  08/13/2002  T:  08/13/2002  Job:  [id number]   cc:   Ernestina Penna, M.D.  [address] [place]  [place] [address]  Fax: [phone]

## 2010-08-02 ENCOUNTER — Encounter: Payer: Medicare Other | Admitting: Physical Medicine & Rehabilitation

## 2010-08-24 ENCOUNTER — Encounter: Payer: Medicare Other | Attending: Physical Medicine & Rehabilitation | Admitting: Physical Medicine & Rehabilitation

## 2010-10-07 ENCOUNTER — Encounter: Payer: Self-pay | Admitting: Cardiology

## 2011-03-16 DIAGNOSIS — I4891 Unspecified atrial fibrillation: Secondary | ICD-10-CM | POA: Diagnosis not present

## 2011-04-10 DIAGNOSIS — I69959 Hemiplegia and hemiparesis following unspecified cerebrovascular disease affecting unspecified side: Secondary | ICD-10-CM | POA: Diagnosis not present

## 2011-04-10 DIAGNOSIS — IMO0001 Reserved for inherently not codable concepts without codable children: Secondary | ICD-10-CM | POA: Diagnosis not present

## 2011-04-13 DIAGNOSIS — I69959 Hemiplegia and hemiparesis following unspecified cerebrovascular disease affecting unspecified side: Secondary | ICD-10-CM | POA: Diagnosis not present

## 2011-04-13 DIAGNOSIS — IMO0001 Reserved for inherently not codable concepts without codable children: Secondary | ICD-10-CM | POA: Diagnosis not present

## 2011-04-17 DIAGNOSIS — IMO0001 Reserved for inherently not codable concepts without codable children: Secondary | ICD-10-CM | POA: Diagnosis not present

## 2011-04-17 DIAGNOSIS — I69959 Hemiplegia and hemiparesis following unspecified cerebrovascular disease affecting unspecified side: Secondary | ICD-10-CM | POA: Diagnosis not present

## 2011-04-19 DIAGNOSIS — I69959 Hemiplegia and hemiparesis following unspecified cerebrovascular disease affecting unspecified side: Secondary | ICD-10-CM | POA: Diagnosis not present

## 2011-04-19 DIAGNOSIS — IMO0001 Reserved for inherently not codable concepts without codable children: Secondary | ICD-10-CM | POA: Diagnosis not present

## 2011-04-25 DIAGNOSIS — I69959 Hemiplegia and hemiparesis following unspecified cerebrovascular disease affecting unspecified side: Secondary | ICD-10-CM | POA: Diagnosis not present

## 2011-04-25 DIAGNOSIS — IMO0001 Reserved for inherently not codable concepts without codable children: Secondary | ICD-10-CM | POA: Diagnosis not present

## 2011-04-27 DIAGNOSIS — I69959 Hemiplegia and hemiparesis following unspecified cerebrovascular disease affecting unspecified side: Secondary | ICD-10-CM | POA: Diagnosis not present

## 2011-04-27 DIAGNOSIS — IMO0001 Reserved for inherently not codable concepts without codable children: Secondary | ICD-10-CM | POA: Diagnosis not present

## 2011-04-28 DIAGNOSIS — I4891 Unspecified atrial fibrillation: Secondary | ICD-10-CM | POA: Diagnosis not present

## 2011-05-02 DIAGNOSIS — N390 Urinary tract infection, site not specified: Secondary | ICD-10-CM | POA: Diagnosis not present

## 2011-06-02 DIAGNOSIS — I4891 Unspecified atrial fibrillation: Secondary | ICD-10-CM | POA: Diagnosis not present

## 2011-06-15 DIAGNOSIS — E039 Hypothyroidism, unspecified: Secondary | ICD-10-CM | POA: Diagnosis not present

## 2011-06-15 DIAGNOSIS — E785 Hyperlipidemia, unspecified: Secondary | ICD-10-CM | POA: Diagnosis not present

## 2011-06-15 DIAGNOSIS — I4891 Unspecified atrial fibrillation: Secondary | ICD-10-CM | POA: Diagnosis not present

## 2011-06-15 DIAGNOSIS — I10 Essential (primary) hypertension: Secondary | ICD-10-CM | POA: Diagnosis not present

## 2011-06-15 DIAGNOSIS — I6789 Other cerebrovascular disease: Secondary | ICD-10-CM | POA: Diagnosis not present

## 2011-06-16 DIAGNOSIS — E785 Hyperlipidemia, unspecified: Secondary | ICD-10-CM | POA: Diagnosis not present

## 2011-06-16 DIAGNOSIS — E559 Vitamin D deficiency, unspecified: Secondary | ICD-10-CM | POA: Diagnosis not present

## 2011-06-16 DIAGNOSIS — I10 Essential (primary) hypertension: Secondary | ICD-10-CM | POA: Diagnosis not present

## 2011-07-10 ENCOUNTER — Ambulatory Visit: Payer: PRIVATE HEALTH INSURANCE | Admitting: Physical Therapy

## 2011-07-14 DIAGNOSIS — I4891 Unspecified atrial fibrillation: Secondary | ICD-10-CM | POA: Diagnosis not present

## 2011-07-17 DIAGNOSIS — R7989 Other specified abnormal findings of blood chemistry: Secondary | ICD-10-CM | POA: Diagnosis not present

## 2011-08-24 DIAGNOSIS — I4891 Unspecified atrial fibrillation: Secondary | ICD-10-CM | POA: Diagnosis not present

## 2011-08-31 DIAGNOSIS — M109 Gout, unspecified: Secondary | ICD-10-CM | POA: Diagnosis not present

## 2011-08-31 DIAGNOSIS — I10 Essential (primary) hypertension: Secondary | ICD-10-CM | POA: Diagnosis not present

## 2011-09-28 DIAGNOSIS — I4891 Unspecified atrial fibrillation: Secondary | ICD-10-CM | POA: Diagnosis not present

## 2011-10-18 DIAGNOSIS — I4891 Unspecified atrial fibrillation: Secondary | ICD-10-CM | POA: Diagnosis not present

## 2011-10-18 DIAGNOSIS — I6789 Other cerebrovascular disease: Secondary | ICD-10-CM | POA: Diagnosis not present

## 2011-10-26 DIAGNOSIS — I4891 Unspecified atrial fibrillation: Secondary | ICD-10-CM | POA: Diagnosis not present

## 2011-11-30 DIAGNOSIS — I4891 Unspecified atrial fibrillation: Secondary | ICD-10-CM | POA: Diagnosis not present

## 2012-01-04 DIAGNOSIS — Z23 Encounter for immunization: Secondary | ICD-10-CM | POA: Diagnosis not present

## 2012-01-09 DIAGNOSIS — I4891 Unspecified atrial fibrillation: Secondary | ICD-10-CM | POA: Diagnosis not present

## 2012-02-08 DIAGNOSIS — I4891 Unspecified atrial fibrillation: Secondary | ICD-10-CM | POA: Diagnosis not present

## 2012-03-14 DIAGNOSIS — I4891 Unspecified atrial fibrillation: Secondary | ICD-10-CM | POA: Diagnosis not present

## 2012-03-22 DIAGNOSIS — I4891 Unspecified atrial fibrillation: Secondary | ICD-10-CM | POA: Diagnosis not present

## 2012-05-25 ENCOUNTER — Other Ambulatory Visit: Payer: Self-pay | Admitting: Family Medicine

## 2012-05-28 LAB — PROTIME-INR

## 2012-05-28 LAB — POCT INR: INR: 2.1

## 2012-05-29 ENCOUNTER — Other Ambulatory Visit: Payer: Self-pay | Admitting: Pharmacist

## 2012-05-29 ENCOUNTER — Ambulatory Visit (INDEPENDENT_AMBULATORY_CARE_PROVIDER_SITE_OTHER): Payer: Self-pay | Admitting: Pharmacist

## 2012-05-29 ENCOUNTER — Telehealth (INDEPENDENT_AMBULATORY_CARE_PROVIDER_SITE_OTHER): Payer: Medicare Other | Admitting: Pharmacist

## 2012-05-29 DIAGNOSIS — I4891 Unspecified atrial fibrillation: Secondary | ICD-10-CM | POA: Insufficient documentation

## 2012-05-29 NOTE — Telephone Encounter (Signed)
This rx is still pending. Can you refill? Thanks

## 2012-05-29 NOTE — Telephone Encounter (Signed)
INR results reported to patient's daughter. INR was 2.1 - therapeutic anticoagulation  Continue current dose of 7.5mg  qd except 3.75mg  TU/TH/SAT

## 2012-06-06 ENCOUNTER — Ambulatory Visit: Payer: Medicare Other | Admitting: Family Medicine

## 2012-06-06 ENCOUNTER — Encounter: Payer: Self-pay | Admitting: Family Medicine

## 2012-06-06 VITALS — BP 118/80 | HR 60

## 2012-06-06 DIAGNOSIS — E785 Hyperlipidemia, unspecified: Secondary | ICD-10-CM

## 2012-06-06 DIAGNOSIS — I4891 Unspecified atrial fibrillation: Secondary | ICD-10-CM

## 2012-06-06 DIAGNOSIS — M109 Gout, unspecified: Secondary | ICD-10-CM | POA: Diagnosis not present

## 2012-06-06 DIAGNOSIS — I10 Essential (primary) hypertension: Secondary | ICD-10-CM

## 2012-06-06 DIAGNOSIS — J309 Allergic rhinitis, unspecified: Secondary | ICD-10-CM

## 2012-06-06 DIAGNOSIS — Z79899 Other long term (current) drug therapy: Secondary | ICD-10-CM | POA: Diagnosis not present

## 2012-06-06 DIAGNOSIS — E039 Hypothyroidism, unspecified: Secondary | ICD-10-CM

## 2012-06-06 DIAGNOSIS — E559 Vitamin D deficiency, unspecified: Secondary | ICD-10-CM

## 2012-06-06 NOTE — Patient Instructions (Signed)
Lab technician will come and draw lab work within the next 1-2 weeks

## 2012-06-06 NOTE — Progress Notes (Signed)
  Subjective:    Patient ID: Omar [name], male    DOB: Mar 13, 1945, 67 y.o.   MRN: [id number]  HPI This patient presents for recheck of multiple medical problems. His wife was with the patient today during this home visit.  Patient Active Problem List  Diagnosis  . OBESITY, MORBID  . FIBRILLATION, ATRIAL  . Osteoarthrosis and allied disorders  . Other and unspecified hyperlipidemia  . Vitamin D deficiency  . Hypertension  . Asthma  . A-fib    In addition, he c/o nasal congestion.  The allergies, current medications, past medical history, surgical history, family and social history are reviewed.  Immunizations reviewed.  Health maintenance reviewed.  The following items are outstanding: pneumovax and zostavax     Review of Systems  Constitutional: Negative for fever, chills, activity change and appetite change.  HENT: Positive for congestion and postnasal drip. Negative for ear pain, nosebleeds, sore throat, facial swelling, trouble swallowing and ear discharge.   Eyes: Negative for discharge and visual disturbance.  Respiratory: Negative for cough, chest tightness and wheezing.   Cardiovascular: Negative for chest pain and palpitations.  Gastrointestinal: Negative for nausea, vomiting, abdominal pain, diarrhea, constipation, blood in stool and anal bleeding.  Genitourinary: Negative for dysuria, frequency, hematuria and difficulty urinating.  Skin: Negative.   Allergic/Immunologic: Positive for environmental allergies.  Neurological: Positive for weakness.       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 Alert talkative and in good spirit considering his circumstances. He is confined to his motorized wheelchair       Objective:   Physical Exam  Vitals reviewed. Constitutional: He is oriented to person, place, and time. He appears well-developed and well-nourished.  HENT:  Head: Normocephalic and atraumatic.  Eyes: Conjunctivae and EOM  are normal.  Neck: Normal range of motion. Neck supple. No thyromegaly present.  Cardiovascular: Normal rate and normal heart sounds.   No murmur heard. It has slightly irregular rhythm at 60 per minute  Pulmonary/Chest: Effort normal and breath sounds normal. No respiratory distress. He has no wheezes. He has no rales.  Abdominal: Soft. He exhibits no distension and no mass. There is no tenderness. There is no guarding.  Morbid obesity  Musculoskeletal:  Patient is only able to move his proximal right upper extremity and his proximal right lower extremity. Minimal movement in the distal upper  and lower right extremity  Lymphadenopathy:    He has no cervical adenopathy.  Neurological: He is alert and oriented to person, place, and time. He displays abnormal reflex (right). He exhibits abnormal muscle tone (right).  Skin: Skin is warm and dry. No rash noted. No erythema.  Psychiatric: He has a normal mood and affect. His behavior is normal. Judgment and thought content normal.          Assessment & Plan:  1. Allergic rhinitis Continue Zyrtec   2. High risk medication use - POCT CBC; Future  3. A-fib   4. Gout - Uric acid  5. Essential hypertension, benign - BASIC METABOLIC PANEL WITH GFR; Future  6. Unspecified vitamin D deficiency - Vitamin D 25 hydroxy; Future  7. Hyperlipidemia - Hepatic function panel; Future - Lipid panel; Future  8. High risk medication use Patient does home Coumadin monitor - POCT CBC; Future  9. Hypothyroid - Thyroid Panel With TSH; Future

## 2012-06-07 ENCOUNTER — Telehealth: Payer: Self-pay | Admitting: Family Medicine

## 2012-06-07 ENCOUNTER — Other Ambulatory Visit: Payer: Self-pay | Admitting: Family Medicine

## 2012-06-07 DIAGNOSIS — I4891 Unspecified atrial fibrillation: Secondary | ICD-10-CM | POA: Diagnosis not present

## 2012-06-07 NOTE — Telephone Encounter (Signed)
DONE

## 2012-06-11 ENCOUNTER — Encounter: Payer: Self-pay | Admitting: Family Medicine

## 2012-06-15 ENCOUNTER — Other Ambulatory Visit: Payer: Self-pay | Admitting: Family Medicine

## 2012-06-17 NOTE — Telephone Encounter (Signed)
Have a request for lipitor 20 to be refilled, chart says simvastatin 40, i have routed this to you and chart is on your table

## 2012-06-19 LAB — PROTIME-INR

## 2012-06-20 ENCOUNTER — Ambulatory Visit (INDEPENDENT_AMBULATORY_CARE_PROVIDER_SITE_OTHER): Payer: Medicare Other | Admitting: Pharmacist

## 2012-06-20 DIAGNOSIS — I4891 Unspecified atrial fibrillation: Secondary | ICD-10-CM

## 2012-07-01 ENCOUNTER — Ambulatory Visit (INDEPENDENT_AMBULATORY_CARE_PROVIDER_SITE_OTHER): Payer: Medicare Other | Admitting: Pharmacist

## 2012-07-01 DIAGNOSIS — I4891 Unspecified atrial fibrillation: Secondary | ICD-10-CM

## 2012-07-05 LAB — POCT INR: INR: 2

## 2012-07-08 ENCOUNTER — Telehealth: Payer: Self-pay | Admitting: *Deleted

## 2012-07-08 ENCOUNTER — Ambulatory Visit (INDEPENDENT_AMBULATORY_CARE_PROVIDER_SITE_OTHER): Payer: Medicare Other | Admitting: Pharmacist

## 2012-07-08 NOTE — Telephone Encounter (Signed)
Given a prescription I will sign it for wheelchair repair

## 2012-07-08 NOTE — Telephone Encounter (Signed)
Pt needs RX for wheelchair repair sent to [facility] fax is [phone]

## 2012-07-09 NOTE — Telephone Encounter (Signed)
Omar [name], please review

## 2012-07-11 NOTE — Telephone Encounter (Signed)
RX given to [facility]

## 2012-07-12 DIAGNOSIS — I4891 Unspecified atrial fibrillation: Secondary | ICD-10-CM | POA: Diagnosis not present

## 2012-07-12 NOTE — Telephone Encounter (Signed)
W/c repair rx was faxed to [facility], patients daughter aware.

## 2012-07-16 ENCOUNTER — Telehealth: Payer: Self-pay | Admitting: Pharmacist Clinician (PhC)/ Clinical Pharmacy Specialist

## 2012-07-16 ENCOUNTER — Other Ambulatory Visit: Payer: Self-pay | Admitting: Family Medicine

## 2012-07-16 ENCOUNTER — Other Ambulatory Visit (INDEPENDENT_AMBULATORY_CARE_PROVIDER_SITE_OTHER): Payer: Medicare Other

## 2012-07-16 DIAGNOSIS — E559 Vitamin D deficiency, unspecified: Secondary | ICD-10-CM | POA: Diagnosis not present

## 2012-07-16 DIAGNOSIS — Z79899 Other long term (current) drug therapy: Secondary | ICD-10-CM | POA: Diagnosis not present

## 2012-07-16 DIAGNOSIS — R5381 Other malaise: Secondary | ICD-10-CM | POA: Diagnosis not present

## 2012-07-16 DIAGNOSIS — E039 Hypothyroidism, unspecified: Secondary | ICD-10-CM | POA: Diagnosis not present

## 2012-07-16 DIAGNOSIS — E785 Hyperlipidemia, unspecified: Secondary | ICD-10-CM

## 2012-07-16 DIAGNOSIS — R5383 Other fatigue: Secondary | ICD-10-CM | POA: Diagnosis not present

## 2012-07-16 DIAGNOSIS — I10 Essential (primary) hypertension: Secondary | ICD-10-CM

## 2012-07-16 LAB — POCT CBC
Hemoglobin: 12.2 g/dL — AB (ref 14.1–18.1)
Lymph, poc: 1.2 (ref 0.6–3.4)
MCH, POC: 31.8 pg — AB (ref 27–31.2)
MCHC: 33 g/dL (ref 31.8–35.4)
MPV: 9.4 fL (ref 0–99.8)
POC LYMPH PERCENT: 30 %L (ref 10–50)
Platelet Count, POC: 110 10*3/uL — AB (ref 142–424)
RDW, POC: 15.3 %
WBC: 4.1 10*3/uL — AB (ref 4.6–10.2)

## 2012-07-16 LAB — POCT INR: INR: 2.2

## 2012-07-16 LAB — THYROID PANEL WITH TSH: TSH: 0.595 u[IU]/mL (ref 0.350–4.500)

## 2012-07-16 NOTE — Telephone Encounter (Signed)
Called Patient with [facility] results.

## 2012-07-16 NOTE — Progress Notes (Signed)
Patient came in for labs only.

## 2012-07-17 LAB — HEPATIC FUNCTION PANEL
AST: 12 U/L (ref 0–37)
Alkaline Phosphatase: 70 U/L (ref 39–117)
Bilirubin, Direct: 0.3 mg/dL (ref 0.0–0.3)
Indirect Bilirubin: 0.3 mg/dL (ref 0.0–0.9)
Total Bilirubin: 0.6 mg/dL (ref 0.3–1.2)

## 2012-07-17 LAB — BASIC METABOLIC PANEL WITH GFR
CO2: 26 mEq/L (ref 19–32)
Chloride: 102 mEq/L (ref 96–112)
Sodium: 135 mEq/L (ref 135–145)

## 2012-07-17 LAB — LIPID PANEL: Total CHOL/HDL Ratio: 2.6 Ratio

## 2012-07-17 LAB — VITAMIN D 25 HYDROXY (VIT D DEFICIENCY, FRACTURES): Vit D, 25-Hydroxy: 30 ng/mL (ref 30–89)

## 2012-07-23 ENCOUNTER — Telehealth: Payer: Self-pay | Admitting: *Deleted

## 2012-07-23 LAB — POCT INR
INR: 2.3
INR: 2.3

## 2012-07-23 NOTE — Telephone Encounter (Signed)
Message copied by Bearl Mulberry on Tue Jul 23, 2012  5:23 PM ------      Message from: Ernestina Penna      Created: Tue Jul 16, 2012  7:33 PM       CBC has a slightly decreased white blood cell count, and a slightly decreased hemoglobin at 12.2      Has he had any sign of any blood loss per rectum?      Should give him a fecal occult blood test ------

## 2012-07-23 NOTE — Telephone Encounter (Signed)
Pt notified of lab results

## 2012-07-24 ENCOUNTER — Ambulatory Visit: Payer: Self-pay | Admitting: Pharmacist

## 2012-07-31 ENCOUNTER — Telehealth: Payer: Self-pay | Admitting: Family Medicine

## 2012-08-01 NOTE — Telephone Encounter (Signed)
Called into [facility] patient aware  

## 2012-08-01 NOTE — Telephone Encounter (Signed)
Patient states that he has sinus congestion and cough been going on for 4 days please advise

## 2012-08-01 NOTE — Telephone Encounter (Signed)
Keflex 500 mg 3 times a day for 10 days #30

## 2012-08-04 ENCOUNTER — Other Ambulatory Visit: Payer: Self-pay | Admitting: Family Medicine

## 2012-08-07 LAB — PROTIME-INR

## 2012-08-07 LAB — POCT INR: INR: 2.5

## 2012-08-14 ENCOUNTER — Other Ambulatory Visit: Payer: Self-pay | Admitting: *Deleted

## 2012-08-14 ENCOUNTER — Ambulatory Visit: Payer: Self-pay | Admitting: Pharmacist

## 2012-08-14 MED ORDER — WARFARIN SODIUM 7.5 MG PO TABS: ORAL_TABLET | ORAL | Status: DC

## 2012-08-14 NOTE — Telephone Encounter (Signed)
CAN YOU REVIEW AND RF IF APPROPRIATE. EPIC HAD NEXT RCK WAS 07/31/12. DIDNT SEE OV FOR THAT DATE. THANKS.

## 2012-08-14 NOTE — Telephone Encounter (Signed)
INR was done 08/07/2012.   Also recheck due this week.  Called to remind patient.  OK to fill warfarin for 6 months

## 2012-08-15 ENCOUNTER — Telehealth: Payer: Self-pay | Admitting: *Deleted

## 2012-08-15 LAB — POCT INR: INR: 2.4

## 2012-08-15 NOTE — Telephone Encounter (Signed)
Javonna called, her dads INR is 2.4 this am.

## 2012-08-16 ENCOUNTER — Ambulatory Visit (INDEPENDENT_AMBULATORY_CARE_PROVIDER_SITE_OTHER): Payer: Medicare Other | Admitting: Pharmacist

## 2012-08-16 DIAGNOSIS — I4891 Unspecified atrial fibrillation: Secondary | ICD-10-CM

## 2012-08-16 NOTE — Progress Notes (Signed)
INR results reviewed and called patient.  Instructed to continue same warfarin dose and recheck weekly at home as currently doing.

## 2012-08-23 DIAGNOSIS — I4891 Unspecified atrial fibrillation: Secondary | ICD-10-CM | POA: Diagnosis not present

## 2012-08-27 NOTE — Telephone Encounter (Signed)
ok 

## 2012-09-12 ENCOUNTER — Ambulatory Visit (INDEPENDENT_AMBULATORY_CARE_PROVIDER_SITE_OTHER): Payer: Medicare Other | Admitting: Pharmacist

## 2012-09-12 DIAGNOSIS — Z7901 Long term (current) use of anticoagulants: Secondary | ICD-10-CM

## 2012-09-19 ENCOUNTER — Other Ambulatory Visit: Payer: Self-pay | Admitting: Family Medicine

## 2012-09-21 ENCOUNTER — Other Ambulatory Visit: Payer: Self-pay | Admitting: Family Medicine

## 2012-09-25 ENCOUNTER — Other Ambulatory Visit: Payer: Self-pay | Admitting: Nurse Practitioner

## 2012-10-02 ENCOUNTER — Ambulatory Visit (INDEPENDENT_AMBULATORY_CARE_PROVIDER_SITE_OTHER): Payer: Self-pay | Admitting: Pharmacist

## 2012-10-06 ENCOUNTER — Other Ambulatory Visit: Payer: Self-pay | Admitting: Family Medicine

## 2012-10-24 ENCOUNTER — Other Ambulatory Visit: Payer: Self-pay | Admitting: Family Medicine

## 2012-10-28 DIAGNOSIS — I4891 Unspecified atrial fibrillation: Secondary | ICD-10-CM | POA: Diagnosis not present

## 2012-11-13 ENCOUNTER — Telehealth: Payer: Self-pay | Admitting: Pharmacist

## 2012-11-13 NOTE — Telephone Encounter (Signed)
Tried to call - patient needs protime.  He was previously checking with INR Monitor at home but haven't received an INR in greater than 1 month.   No Answer at home.

## 2012-11-20 ENCOUNTER — Ambulatory Visit: Payer: Self-pay | Admitting: Pharmacist

## 2012-11-20 NOTE — Telephone Encounter (Signed)
INR result received 11/20/12

## 2012-11-26 ENCOUNTER — Other Ambulatory Visit: Payer: Self-pay | Admitting: Family Medicine

## 2012-11-28 ENCOUNTER — Other Ambulatory Visit: Payer: Self-pay | Admitting: Family Medicine

## 2012-12-05 ENCOUNTER — Ambulatory Visit (INDEPENDENT_AMBULATORY_CARE_PROVIDER_SITE_OTHER): Payer: Medicare Other | Admitting: Pharmacist

## 2012-12-05 DIAGNOSIS — I4891 Unspecified atrial fibrillation: Secondary | ICD-10-CM

## 2012-12-05 NOTE — Progress Notes (Signed)
Patient checks INR at home with Home INR monitoring.  Billing once per month interupertation fee.  Patient diagnosis - atrial fibrillation  Procedure code is G0250     

## 2012-12-18 ENCOUNTER — Telehealth: Payer: Self-pay | Admitting: Family Medicine

## 2012-12-18 NOTE — Telephone Encounter (Signed)
Called in patient aware nothing else is going on with him

## 2012-12-18 NOTE — Telephone Encounter (Signed)
Is not allergic to penicillin, may have amoxicillin 500  3 times daily for 10 days Please call patient and let him up and make sure that there is nothing else going on

## 2012-12-18 NOTE — Telephone Encounter (Signed)
Wants something called in for a sore throat he said he would have a hard time getting in here to be seen.

## 2012-12-26 ENCOUNTER — Ambulatory Visit (INDEPENDENT_AMBULATORY_CARE_PROVIDER_SITE_OTHER): Payer: Self-pay | Admitting: Pharmacist

## 2012-12-28 ENCOUNTER — Other Ambulatory Visit: Payer: Self-pay | Admitting: Family Medicine

## 2012-12-31 ENCOUNTER — Other Ambulatory Visit: Payer: Self-pay | Admitting: Family Medicine

## 2012-12-31 ENCOUNTER — Telehealth: Payer: Self-pay | Admitting: Family Medicine

## 2012-12-31 ENCOUNTER — Ambulatory Visit: Payer: Medicare Other | Admitting: Family Medicine

## 2012-12-31 DIAGNOSIS — Z23 Encounter for immunization: Secondary | ICD-10-CM | POA: Diagnosis not present

## 2012-12-31 NOTE — Telephone Encounter (Signed)
Pt aware we will give flu shot

## 2013-01-08 ENCOUNTER — Ambulatory Visit (INDEPENDENT_AMBULATORY_CARE_PROVIDER_SITE_OTHER): Payer: Medicare Other | Admitting: *Deleted

## 2013-01-16 LAB — POCT INR: INR: 2.4

## 2013-01-20 ENCOUNTER — Ambulatory Visit (INDEPENDENT_AMBULATORY_CARE_PROVIDER_SITE_OTHER): Payer: Medicare Other | Admitting: Pharmacist

## 2013-01-20 DIAGNOSIS — I4891 Unspecified atrial fibrillation: Secondary | ICD-10-CM | POA: Diagnosis not present

## 2013-01-20 NOTE — Progress Notes (Signed)
Patient checks INR at home with Home INR monitoring.  Billing once per month interupertation fee.  Patient diagnosis - A.fib 427.31 Procedure code if [id number]

## 2013-01-24 ENCOUNTER — Other Ambulatory Visit: Payer: Self-pay | Admitting: Family Medicine

## 2013-01-31 LAB — POCT INR: INR: 2.2

## 2013-02-01 ENCOUNTER — Other Ambulatory Visit: Payer: Self-pay | Admitting: Family Medicine

## 2013-02-03 ENCOUNTER — Ambulatory Visit (INDEPENDENT_AMBULATORY_CARE_PROVIDER_SITE_OTHER): Payer: Medicare Other | Admitting: Pharmacist

## 2013-02-03 DIAGNOSIS — I4891 Unspecified atrial fibrillation: Secondary | ICD-10-CM

## 2013-02-03 NOTE — Progress Notes (Signed)
Patient checks INR at home with Home INR monitoring.  Billing once per month interupertation fee.  Patient diagnosis - atrial fibrillation Procedure code if G0250  

## 2013-02-04 ENCOUNTER — Other Ambulatory Visit: Payer: Self-pay | Admitting: Family Medicine

## 2013-02-05 ENCOUNTER — Other Ambulatory Visit: Payer: Self-pay | Admitting: *Deleted

## 2013-02-05 MED ORDER — GABAPENTIN 300 MG PO CAPS: ORAL_CAPSULE | ORAL | Status: DC

## 2013-02-12 ENCOUNTER — Encounter: Payer: Self-pay | Admitting: General Practice

## 2013-02-12 ENCOUNTER — Ambulatory Visit (INDEPENDENT_AMBULATORY_CARE_PROVIDER_SITE_OTHER): Payer: Medicare Other | Admitting: Pharmacist

## 2013-02-12 ENCOUNTER — Telehealth: Payer: Self-pay | Admitting: General Practice

## 2013-02-12 ENCOUNTER — Ambulatory Visit (INDEPENDENT_AMBULATORY_CARE_PROVIDER_SITE_OTHER): Payer: Medicare Other | Admitting: General Practice

## 2013-02-12 VITALS — BP 126/82 | HR 75 | Temp 100.1°F

## 2013-02-12 DIAGNOSIS — J069 Acute upper respiratory infection, unspecified: Secondary | ICD-10-CM

## 2013-02-12 DIAGNOSIS — R0989 Other specified symptoms and signs involving the circulatory and respiratory systems: Secondary | ICD-10-CM

## 2013-02-12 DIAGNOSIS — R062 Wheezing: Secondary | ICD-10-CM

## 2013-02-12 DIAGNOSIS — R05 Cough: Secondary | ICD-10-CM | POA: Diagnosis not present

## 2013-02-12 DIAGNOSIS — I4891 Unspecified atrial fibrillation: Secondary | ICD-10-CM

## 2013-02-12 MED ORDER — AMOXICILLIN 500 MG PO CAPS: 500.0000 mg | ORAL_CAPSULE | Freq: Two times a day (BID) | ORAL | Status: DC

## 2013-02-12 MED ORDER — ALBUTEROL SULFATE HFA 108 (90 BASE) MCG/ACT IN AERS: 2.0000 | INHALATION_SPRAY | Freq: Four times a day (QID) | RESPIRATORY_TRACT | Status: DC | PRN

## 2013-02-12 MED ORDER — ALBUTEROL SULFATE (2.5 MG/3ML) 0.083% IN NEBU
2.5000 mg | INHALATION_SOLUTION | Freq: Once | RESPIRATORY_TRACT | Status: AC
  Administered 2013-02-12: 2.5 mg via RESPIRATORY_TRACT

## 2013-02-12 NOTE — Progress Notes (Signed)
   Subjective:    Patient ID: Omar [name], male    DOB: 1945-04-15, 67 y.o.   MRN: [id number]  Cough This is a new problem. The current episode started in the past 7 days (onset 3 days ago). The problem has been unchanged. The cough is non-productive. Associated symptoms include a fever, nasal congestion, postnasal drip, rhinorrhea and wheezing. Pertinent negatives include no chest pain, chills, ear congestion, ear pain, headaches, sore throat or shortness of breath. The symptoms are aggravated by lying down. He has tried nothing for the symptoms.  Fever  This is a new problem. The current episode started in the past 7 days. The problem occurs daily. The problem has been unchanged. The maximum temperature noted was 100 to 100.9 F. The temperature was taken using an oral thermometer. Associated symptoms include congestion, coughing and wheezing. Pertinent negatives include no abdominal pain, chest pain, diarrhea, ear pain, headaches, muscle aches or sore throat. He has tried acetaminophen for the symptoms. The treatment provided no relief.      Review of Systems  Constitutional: Positive for fever. Negative for chills.  HENT: Positive for congestion, postnasal drip, rhinorrhea and sneezing. Negative for ear pain and sore throat.        Nasal and chest congestion  Respiratory: Positive for cough and wheezing. Negative for chest tightness and shortness of breath.   Cardiovascular: Negative for chest pain and palpitations.  Gastrointestinal: Negative for abdominal pain and diarrhea.  Genitourinary: Negative for difficulty urinating.  Neurological: Negative for dizziness, weakness and headaches.       Objective:   Physical Exam  Constitutional: He is oriented to person, place, and time. He appears well-developed and well-nourished.  HENT:  Head: Normocephalic and atraumatic.  Right Ear: External ear normal.  Left Ear: External ear normal.  Nose: Mucosal edema and rhinorrhea present.    Mouth/Throat: Posterior oropharyngeal erythema present.  Eyes: Pupils are equal, round, and reactive to light.  Cardiovascular: Normal rate, regular rhythm and normal heart sounds.   Pulmonary/Chest: Effort normal. No respiratory distress. He has wheezes in the right upper field and the left upper field. He exhibits no tenderness.  Neurological: He is alert and oriented to person, place, and time.  Skin: Skin is warm and dry.  Psychiatric: He has a normal mood and affect.          Assessment & Plan:  1. Cough  2. Chest congestion May use OTC mucinex (non decongestant)  3. Wheezing  - albuterol (PROVENTIL) (2.5 MG/3ML) 0.083% nebulizer solution 2.5 mg; Take 3 mLs (2.5 mg total) by nebulization once. -albuterol inhaler every 6 hours as needed for wheezing -bilateral upper lobe wheezing resolved after breathing treatment  4. Upper respiratory infection  - amoxicillin (AMOXIL) 500 MG capsule; Take 1 capsule (500 mg total) by mouth 2 (two) times daily.  Dispense: 20 capsule; Refill: 0 -discussed adequate fluids -RTO if symptoms worsen or unresolved -may seek emergency medical treatment Patient verbalized understanding Coralie Keens, FNP-C

## 2013-02-12 NOTE — Progress Notes (Signed)
Patient checks INR at home with Home INR monitoring.  Billing once per month interupertation fee.  Patient diagnosis - atrial fibrillation Procedure code if G0250  

## 2013-02-12 NOTE — Telephone Encounter (Signed)
Aware,albuterol sent in.

## 2013-02-12 NOTE — Patient Instructions (Signed)

## 2013-02-21 LAB — POCT INR: INR: 2.3

## 2013-02-24 ENCOUNTER — Ambulatory Visit (INDEPENDENT_AMBULATORY_CARE_PROVIDER_SITE_OTHER): Payer: Medicare Other | Admitting: Pharmacist

## 2013-02-24 NOTE — Progress Notes (Signed)
INR results faxed from home INR monitoring company [employer].  Only allowed to bill 1 interruptation fee per month.

## 2013-03-01 ENCOUNTER — Other Ambulatory Visit: Payer: Self-pay | Admitting: Family Medicine

## 2013-03-06 ENCOUNTER — Other Ambulatory Visit: Payer: Self-pay | Admitting: Family Medicine

## 2013-03-10 ENCOUNTER — Ambulatory Visit (INDEPENDENT_AMBULATORY_CARE_PROVIDER_SITE_OTHER): Payer: Medicare Other | Admitting: Pharmacist

## 2013-03-10 NOTE — Telephone Encounter (Signed)
Last seen 02/12/13 Mae  Last lipid 07/16/12

## 2013-03-14 ENCOUNTER — Telehealth: Payer: Self-pay | Admitting: Family Medicine

## 2013-03-14 NOTE — Telephone Encounter (Signed)
Pt is some better but not completely well Wants refill on antibiotic Please advise

## 2013-03-15 ENCOUNTER — Other Ambulatory Visit: Payer: Self-pay | Admitting: General Practice

## 2013-03-15 DIAGNOSIS — J069 Acute upper respiratory infection, unspecified: Secondary | ICD-10-CM

## 2013-03-15 MED ORDER — AMOXICILLIN 500 MG PO CAPS: 500.0000 mg | ORAL_CAPSULE | Freq: Three times a day (TID) | ORAL | Status: DC

## 2013-03-15 NOTE — Telephone Encounter (Signed)
Spoke with patient and he verbalized feeling much better, but not completely. Amoxicillin script sent to pharmacy. Patient to RTO if symptoms worsen or unresolved. He verbalized understanding and in agreement.

## 2013-03-27 ENCOUNTER — Telehealth: Payer: Self-pay | Admitting: Pharmacist

## 2013-03-27 NOTE — Telephone Encounter (Signed)
Patient called to remind that home INR is due. He states he is going to do today.

## 2013-03-28 LAB — POCT INR
INR: 2.2
INR: 2.2

## 2013-03-29 ENCOUNTER — Other Ambulatory Visit: Payer: Self-pay | Admitting: Family Medicine

## 2013-04-01 NOTE — Telephone Encounter (Signed)
Last Vit D level 07/16/12

## 2013-04-01 NOTE — Telephone Encounter (Signed)
Last Vit D 07/16/12  Normal 30  Omar [name]

## 2013-04-02 ENCOUNTER — Ambulatory Visit (INDEPENDENT_AMBULATORY_CARE_PROVIDER_SITE_OTHER): Payer: Medicare Other | Admitting: Pharmacist

## 2013-04-02 NOTE — Progress Notes (Signed)
No charge for labs or visit - INR check through home monitoring system  

## 2013-04-06 ENCOUNTER — Other Ambulatory Visit: Payer: Self-pay | Admitting: Family Medicine

## 2013-04-10 DIAGNOSIS — I4891 Unspecified atrial fibrillation: Secondary | ICD-10-CM | POA: Diagnosis not present

## 2013-04-11 ENCOUNTER — Other Ambulatory Visit: Payer: Self-pay | Admitting: *Deleted

## 2013-04-11 MED ORDER — ATORVASTATIN CALCIUM 20 MG PO TABS: 20.0000 mg | ORAL_TABLET | Freq: Every day | ORAL | Status: DC

## 2013-04-11 NOTE — Telephone Encounter (Signed)
Last labs May 2014

## 2013-04-17 ENCOUNTER — Ambulatory Visit (INDEPENDENT_AMBULATORY_CARE_PROVIDER_SITE_OTHER): Payer: Medicare Other | Admitting: Family Medicine

## 2013-04-17 ENCOUNTER — Encounter: Payer: Self-pay | Admitting: Family Medicine

## 2013-04-17 VITALS — BP 130/68 | HR 64 | Temp 98.7°F | Ht 67.0 in

## 2013-04-17 DIAGNOSIS — E039 Hypothyroidism, unspecified: Secondary | ICD-10-CM | POA: Diagnosis not present

## 2013-04-17 DIAGNOSIS — E559 Vitamin D deficiency, unspecified: Secondary | ICD-10-CM

## 2013-04-17 DIAGNOSIS — I639 Cerebral infarction, unspecified: Secondary | ICD-10-CM | POA: Insufficient documentation

## 2013-04-17 DIAGNOSIS — E785 Hyperlipidemia, unspecified: Secondary | ICD-10-CM | POA: Diagnosis not present

## 2013-04-17 DIAGNOSIS — Z8673 Personal history of transient ischemic attack (TIA), and cerebral infarction without residual deficits: Secondary | ICD-10-CM | POA: Diagnosis not present

## 2013-04-17 DIAGNOSIS — Z79899 Other long term (current) drug therapy: Secondary | ICD-10-CM

## 2013-04-17 DIAGNOSIS — I10 Essential (primary) hypertension: Secondary | ICD-10-CM

## 2013-04-17 DIAGNOSIS — I4891 Unspecified atrial fibrillation: Secondary | ICD-10-CM | POA: Diagnosis not present

## 2013-04-17 NOTE — Addendum Note (Signed)
Addended by: Magdalene RiverBULLINS, JAMIE H on: 04/17/2013 02:19 PM   Modules accepted: Orders

## 2013-04-17 NOTE — Progress Notes (Signed)
Subjective:    Patient ID: Omar [name], male    DOB: 1945/08/23, 68 y.o.   MRN: [id number]  HPI Pt seen today for follow up and management of chronic medical problems. A home visit was made to see the patient today. 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 He does have a motorized but still unable to come to the office. The patient was pleasant and alert and cooperative and had minimal complaints. He appear to have lost some weight and his face. But he still has morbid obesity. He checks his protimes at home in the office follows. He has not had any blood work anytime recently and someone will office will be sent there to do a blood draw.       Patient Active Problem List   Diagnosis Date Noted  . Allergic rhinitis 06/06/2012  . High risk medication use 06/06/2012  . Gout 06/06/2012  . Osteoarthrosis and allied disorders   . Other and unspecified hyperlipidemia   . Vitamin D deficiency   . Hypertension   . Asthma   . FIBRILLATION, ATRIAL 03/10/2009  . OBESITY, MORBID 03/02/2009   Outpatient Encounter Prescriptions as of 04/17/2013  Medication Sig  . albuterol (PROVENTIL HFA;VENTOLIN HFA) 108 (90 BASE) MCG/ACT inhaler Inhale 2 puffs into the lungs every 6 (six) hours as needed for wheezing or shortness of breath.  . allopurinol (ZYLOPRIM) 300 MG tablet TAKE 1 TABLET BY MOUTH ONCE A DAY  . amLODipine (NORVASC) 5 MG tablet TAKE 1 1/2 TABLET BY MOUTH DAILY  . amoxicillin (AMOXIL) 500 MG capsule Take 1 capsule (500 mg total) by mouth 3 (three) times daily.  Marland Kitchen. atorvastatin (LIPITOR) 20 MG tablet Take 1 tablet (20 mg total) by mouth daily at 6 PM.  . gabapentin (NEURONTIN) 300 MG capsule TAKE 1 CAPSULE AT 8AM, 1 AT 6PM AND 2 AT BEDTIME  . KLOR-CON M20 20 MEQ tablet TAKE 1 TABLET BY MOUTH ONCE A DAY WITH MEALS  . levothyroxine (SYNTHROID, LEVOTHROID) 125 MCG tablet TAKE 1 TABLET EVERY DAY  . losartan (COZAAR) 100 MG tablet TAKE 1 TABLET EVERY DAY  .  metoprolol (LOPRESSOR) 50 MG tablet TAKE 1/2 TABLET TWICE DAILY  . metoprolol tartrate (LOPRESSOR) 25 MG tablet Take 50 mg by mouth. 1/2 twice daily  . Vitamin D, Ergocalciferol, (DRISDOL) 50000 UNITS CAPS Take 50,000 Units by mouth every 7 (seven) days.  Marland Kitchen. warfarin (COUMADIN) 7.5 MG tablet TAKE AS DIRECTED    Review of Systems  Constitutional: Negative.   HENT: Negative.   Eyes: Negative.   Respiratory: Negative.   Cardiovascular: Negative.   Gastrointestinal: Negative.   Endocrine: Negative.   Genitourinary: Negative.   Musculoskeletal: Negative.        Limited mobility of right arm/hand  Skin: Negative.   Allergic/Immunologic: Negative.   Neurological: Negative.   Hematological: Negative.   Psychiatric/Behavioral: Negative.        Objective:   Physical Exam  Nursing note and vitals reviewed. Constitutional: He is oriented to person, place, and time. He appears well-developed and well-nourished. No distress.  Morbidly obese  HENT:  Head: Normocephalic and atraumatic.  Right Ear: External ear normal.  Left Ear: External ear normal.  Nose: Nose normal.  Mouth/Throat: Oropharynx is clear and moist. No oropharyngeal exudate.  Eyes: Conjunctivae and EOM are normal. Pupils are equal, round, and reactive to light. Right eye exhibits no discharge. Left eye exhibits no discharge. No scleral icterus.  Neck: Normal range of motion. Neck supple. No thyromegaly present.  No carotid bruits audible  Cardiovascular: Normal rate, normal heart sounds and intact distal pulses.  Exam reveals no gallop and no friction rub.   No murmur heard. Slightly irregular irregular at 64 per minute  Pulmonary/Chest: Effort normal and breath sounds normal. No respiratory distress. He has no wheezes. He has no rales. He exhibits no tenderness.  Abdominal: Soft. Bowel sounds are normal. He exhibits no mass. There is no tenderness. There is no rebound and no guarding.  Morbid obesity  Musculoskeletal: He  exhibits no edema and no tenderness.  Limited range of movement of right arm and forearm and right thigh and right leg. He is movement of the right upper extremity was greater than the movement of the right lower extremity. His ability to move both upper and lower extremities on the right was as good or better than it has been in the past.  Lymphadenopathy:    He has no cervical adenopathy.  Neurological: He is alert and oriented to person, place, and time.  Skin: Skin is warm and dry. No rash noted. No erythema. No pallor.  Psychiatric: He has a normal mood and affect. His behavior is normal. Judgment and thought content normal.   BP 130/68  Pulse 64  Temp(Src) 98.7 F (37.1 C) (Oral)  Ht 5\' 7"  (1.702 m)    The face-to-face visit made with this patient was 30 minutes not counting, travel to and from his resident    Assessment & Plan:  1. History of CVA (cerebrovascular accident)  2. Atrial fibrillation  3. Other and unspecified hyperlipidemia  4. Unspecified hypothyroidism  5. High risk medication use  6. Essential hypertension, benign Patient Instructions  Continue current medications. Continue good therapeutic lifestyle changes which include good diet and exercise. Fall precautions discussed with patient. Schedule your flu vaccine if you haven't had it yet If you are over 39 years old - you may need Prevnar 13 or the adult Pneumonia vaccine. Continue current medication Use nasal saline Use allergy medicine as needed Arrange for blood draw for patient to get lipid liver BMP thyroid CBC and vitamin B Arrange for transportation to take patient to physical therapy   Nyra Capes MD

## 2013-04-17 NOTE — Patient Instructions (Addendum)
Continue current medications. Continue good therapeutic lifestyle changes which include good diet and exercise. Fall precautions discussed with patient. Schedule your flu vaccine if you haven't had it yet If you are over 70 years old - you may need Prevnar 13 or the adult Pneumonia vaccine. Continue current medication Use nasal saline Use allergy medicine as needed Arrange for blood draw for patient to get lipid liver BMP thyroid CBC and vitamin B Arrange for transportation to take patient to physical therapy

## 2013-04-23 ENCOUNTER — Ambulatory Visit (INDEPENDENT_AMBULATORY_CARE_PROVIDER_SITE_OTHER): Payer: Medicare Other | Admitting: Pharmacist

## 2013-04-23 DIAGNOSIS — I4891 Unspecified atrial fibrillation: Secondary | ICD-10-CM

## 2013-04-23 NOTE — Progress Notes (Signed)
Patient checks INR at home with Home INR monitoring.  Billing once per month interupertation fee.  Patient diagnosis - atrial fibrillation Procedure code if G0250  

## 2013-04-25 ENCOUNTER — Other Ambulatory Visit: Payer: Self-pay | Admitting: Family Medicine

## 2013-04-27 LAB — POCT INR
INR: 2.2
INR: 2.2

## 2013-04-28 ENCOUNTER — Ambulatory Visit (INDEPENDENT_AMBULATORY_CARE_PROVIDER_SITE_OTHER): Payer: Medicare Other | Admitting: Pharmacist

## 2013-04-28 NOTE — Progress Notes (Signed)
INR results faxed from home INR monitoring company [facility] No charge for labs or visit

## 2013-05-08 ENCOUNTER — Telehealth: Payer: Self-pay | Admitting: Family Medicine

## 2013-05-08 MED ORDER — AZITHROMYCIN 250 MG PO TABS: ORAL_TABLET | ORAL | Status: DC

## 2013-05-08 NOTE — Telephone Encounter (Signed)
Patient aware and rx for zpack sent to pharmacy

## 2013-05-08 NOTE — Telephone Encounter (Signed)
Z-Pak and Mucinex maximum strength one twice daily thus the blue wire

## 2013-05-24 ENCOUNTER — Other Ambulatory Visit: Payer: Self-pay | Admitting: Family Medicine

## 2013-06-02 ENCOUNTER — Telehealth: Payer: Self-pay | Admitting: Family Medicine

## 2013-06-02 MED ORDER — PREDNISONE 10 MG PO TABS
ORAL_TABLET | ORAL | Status: DC
Start: 2013-06-02 — End: 2013-09-11

## 2013-06-02 MED ORDER — AZITHROMYCIN 250 MG PO TABS: ORAL_TABLET | ORAL | Status: DC

## 2013-06-02 NOTE — Telephone Encounter (Signed)
Pt aware of all!! 

## 2013-06-02 NOTE — Telephone Encounter (Signed)
Has cough, chest congestin, fever. I can hear pt wheezing over the phone. Wife states he is rattling. Said he couldn't come to office. Please advise. Route to pool.

## 2013-06-12 ENCOUNTER — Ambulatory Visit (INDEPENDENT_AMBULATORY_CARE_PROVIDER_SITE_OTHER): Payer: Medicare Other | Admitting: Pharmacist

## 2013-06-12 DIAGNOSIS — I4891 Unspecified atrial fibrillation: Secondary | ICD-10-CM

## 2013-06-12 LAB — POCT INR: INR: 2.3

## 2013-06-12 NOTE — Progress Notes (Signed)
Patient checks INR at home with Home INR monitoring.  Billing once per month interupertation fee.  Patient diagnosis - venous thromboembolism.  Procedure code if G0250  

## 2013-06-15 DIAGNOSIS — I4891 Unspecified atrial fibrillation: Secondary | ICD-10-CM | POA: Diagnosis not present

## 2013-06-18 ENCOUNTER — Ambulatory Visit: Payer: Self-pay | Admitting: Pharmacist

## 2013-06-18 NOTE — Progress Notes (Signed)
No charge for labs or visit - INR check through home monitoring system  

## 2013-06-30 LAB — POCT INR
INR: 2.3
INR: 2.6

## 2013-07-02 ENCOUNTER — Ambulatory Visit (INDEPENDENT_AMBULATORY_CARE_PROVIDER_SITE_OTHER): Payer: Medicare Other | Admitting: Pharmacist

## 2013-07-02 ENCOUNTER — Other Ambulatory Visit: Payer: Self-pay | Admitting: Family Medicine

## 2013-07-02 NOTE — Progress Notes (Signed)
No charge for labs or visit - INR check through home monitoring system  

## 2013-07-08 LAB — POCT INR: INR: 2.3

## 2013-07-09 ENCOUNTER — Other Ambulatory Visit: Payer: Self-pay | Admitting: Family Medicine

## 2013-07-09 ENCOUNTER — Ambulatory Visit (INDEPENDENT_AMBULATORY_CARE_PROVIDER_SITE_OTHER): Payer: Medicare Other | Admitting: Pharmacist

## 2013-07-09 DIAGNOSIS — I4891 Unspecified atrial fibrillation: Secondary | ICD-10-CM

## 2013-07-09 NOTE — Progress Notes (Signed)
Patient checks INR at home with Home INR monitoring.  Billing once per month interupertation fee.  Patient diagnosis - A.Fib Procedure code if [id number]

## 2013-07-14 ENCOUNTER — Other Ambulatory Visit: Payer: Self-pay | Admitting: Family Medicine

## 2013-07-17 ENCOUNTER — Ambulatory Visit (INDEPENDENT_AMBULATORY_CARE_PROVIDER_SITE_OTHER): Payer: Medicare Other | Admitting: Pharmacist

## 2013-07-17 LAB — POCT INR: INR: 2.3

## 2013-07-17 NOTE — Progress Notes (Signed)
No charge for labs or visit - INR check through home monitoring system  

## 2013-07-21 ENCOUNTER — Other Ambulatory Visit: Payer: Self-pay

## 2013-07-21 MED ORDER — AMLODIPINE BESYLATE 5 MG PO TABS: ORAL_TABLET | ORAL | Status: DC

## 2013-07-31 ENCOUNTER — Ambulatory Visit (INDEPENDENT_AMBULATORY_CARE_PROVIDER_SITE_OTHER): Payer: Medicare Other | Admitting: Pharmacist

## 2013-07-31 DIAGNOSIS — I4891 Unspecified atrial fibrillation: Secondary | ICD-10-CM | POA: Diagnosis not present

## 2013-07-31 LAB — POCT INR: INR: 2.9

## 2013-07-31 NOTE — Progress Notes (Signed)
No charge for labs or visit - INR check through home monitoring system  

## 2013-08-02 ENCOUNTER — Other Ambulatory Visit: Payer: Self-pay | Admitting: Family Medicine

## 2013-08-08 ENCOUNTER — Other Ambulatory Visit: Payer: Self-pay | Admitting: Family

## 2013-08-08 NOTE — Progress Notes (Signed)
Anticoagulation Dose Instructions as of 08/08/2013     Omar [name] Tue Wed Thu Fri Sat   New Dose 3.75 mg 7.5 mg 3.75 mg 7.5 mg 3.75 mg 7.5 mg 3.75 mg    Description       Take half tablet today then start: 1 tablet on Mondays, Wednesdays, and Fridays. 1/2 tablet Sundays, Tuesdays and Thursdays, and Saturdays. Recheck protime 1 week     Received pt's INR from [facility]. Result of 3.4

## 2013-08-08 NOTE — Patient Instructions (Addendum)
Anticoagulation Dose Instructions as of 08/08/2013     Omar [name] Tue Wed Thu Fri Sat   New Dose 3.75 mg 7.5 mg 3.75 mg 7.5 mg 3.75 mg 7.5 mg 3.75 mg    Description       Take half tablet today then start: 1 tablet on Mondays, Wednesdays, and Fridays. 1/2 tablet Sundays, Tuesdays and Thursdays, and Saturdays. Recheck protime 1 week

## 2013-08-12 ENCOUNTER — Telehealth: Payer: Self-pay | Admitting: Family Medicine

## 2013-08-12 NOTE — Telephone Encounter (Signed)
Wants to schedule home vist with Dr.moore

## 2013-08-13 ENCOUNTER — Other Ambulatory Visit: Payer: Self-pay | Admitting: Family Medicine

## 2013-08-18 ENCOUNTER — Ambulatory Visit (INDEPENDENT_AMBULATORY_CARE_PROVIDER_SITE_OTHER): Payer: Medicare Other | Admitting: Pharmacist

## 2013-08-18 LAB — POCT INR: INR: 2.9

## 2013-08-19 NOTE — Telephone Encounter (Signed)
appt made

## 2013-08-29 DIAGNOSIS — I4891 Unspecified atrial fibrillation: Secondary | ICD-10-CM | POA: Diagnosis not present

## 2013-09-04 ENCOUNTER — Ambulatory Visit: Payer: Medicare Other | Admitting: Family Medicine

## 2013-09-11 ENCOUNTER — Ambulatory Visit: Payer: Medicare Other | Admitting: Family Medicine

## 2013-09-11 ENCOUNTER — Encounter: Payer: Self-pay | Admitting: Family Medicine

## 2013-09-11 VITALS — BP 108/68 | HR 62

## 2013-09-11 DIAGNOSIS — G8310 Monoplegia of lower limb affecting unspecified side: Secondary | ICD-10-CM

## 2013-09-11 DIAGNOSIS — I10 Essential (primary) hypertension: Secondary | ICD-10-CM

## 2013-09-11 DIAGNOSIS — E038 Other specified hypothyroidism: Secondary | ICD-10-CM

## 2013-09-11 DIAGNOSIS — E559 Vitamin D deficiency, unspecified: Secondary | ICD-10-CM | POA: Diagnosis not present

## 2013-09-11 DIAGNOSIS — E785 Hyperlipidemia, unspecified: Secondary | ICD-10-CM | POA: Diagnosis not present

## 2013-09-11 DIAGNOSIS — I4891 Unspecified atrial fibrillation: Secondary | ICD-10-CM

## 2013-09-11 DIAGNOSIS — I69959 Hemiplegia and hemiparesis following unspecified cerebrovascular disease affecting unspecified side: Secondary | ICD-10-CM

## 2013-09-11 DIAGNOSIS — I69351 Hemiplegia and hemiparesis following cerebral infarction affecting right dominant side: Secondary | ICD-10-CM | POA: Insufficient documentation

## 2013-09-11 DIAGNOSIS — G8311 Monoplegia of lower limb affecting right dominant side: Secondary | ICD-10-CM | POA: Insufficient documentation

## 2013-09-11 DIAGNOSIS — E039 Hypothyroidism, unspecified: Secondary | ICD-10-CM | POA: Insufficient documentation

## 2013-09-11 DIAGNOSIS — E0789 Other specified disorders of thyroid: Secondary | ICD-10-CM

## 2013-09-11 LAB — POCT INR: INR: 3.2

## 2013-09-11 NOTE — Progress Notes (Addendum)
Subjective:    Patient ID: Omar [name], male    DOB: August 27, 1945, 68 y.o.   MRN: [id number]  HPI Home visit today for pt with hypertension, hyperlipidemia and atrial fibrillation. This patient was visited in his home today. His attitude is positive. He is in a motorized wheelchair. He still needs assistance with getting to his bed and using the bedpan. He does get around fairly well with his motorized wheelchair. He is alert. The family is very supportive.        Patient Active Problem List   Diagnosis Date Noted  . History of CVA (cerebrovascular accident) 04/17/2013  . Allergic rhinitis 06/06/2012  . High risk medication use 06/06/2012  . Gout 06/06/2012  . Osteoarthrosis and allied disorders   . Hyperlipidemia   . Vitamin D deficiency   . Hypertension   . Asthma   . FIBRILLATION, ATRIAL 03/10/2009  . OBESITY, MORBID 03/02/2009   Outpatient Encounter Prescriptions as of 09/11/2013  Medication Sig  . albuterol (PROVENTIL HFA;VENTOLIN HFA) 108 (90 BASE) MCG/ACT inhaler Inhale 2 puffs into the lungs every 6 (six) hours as needed for wheezing or shortness of breath.  . allopurinol (ZYLOPRIM) 300 MG tablet TAKE 1 TABLET BY MOUTH ONCE A DAY  . amLODipine (NORVASC) 5 MG tablet TAKE 1 1/2 TABLET BY MOUTH DAILY  . atorvastatin (LIPITOR) 20 MG tablet TAKE 1 TABLET (20 MG TOTAL) BY MOUTH DAILY AT 6 PM.  . gabapentin (NEURONTIN) 300 MG capsule TAKE 1 CAPSULE AT 8AM, 1 AT 6PM AND 2 AT BEDTIME  . KLOR-CON M20 20 MEQ tablet TAKE 1 TABLET BY MOUTH ONCE A DAY WITH MEALS  . levothyroxine (SYNTHROID, LEVOTHROID) 125 MCG tablet TAKE 1 TABLET EVERY DAY  . losartan (COZAAR) 100 MG tablet TAKE 1 TABLET EVERY DAY  . metoprolol (LOPRESSOR) 50 MG tablet TAKE 1/2 TABLET TWICE DAILY  . warfarin (COUMADIN) 7.5 MG tablet Take 1 tablet (7.5 mg total) by mouth daily at 6 PM.  . [DISCONTINUED] amoxicillin (AMOXIL) 500 MG capsule Take 1 capsule (500 mg total) by mouth 3 (three) times daily.  .  [DISCONTINUED] azithromycin (ZITHROMAX) 250 MG tablet 2 the first day then one daily until completed  . [DISCONTINUED] metoprolol tartrate (LOPRESSOR) 25 MG tablet Take 50 mg by mouth. 1/2 twice daily  . [DISCONTINUED] predniSONE (DELTASONE) 10 MG tablet 1 tablet 4 times a day for 2 days,  1 tablet 3 times a day for 2 days,  1 tablet 2 times a day for 2 days, 1 tablet daily for 2 days  . [DISCONTINUED] Vitamin D, Ergocalciferol, (DRISDOL) 50000 UNITS CAPS Take 50,000 Units by mouth every 7 (seven) days.      Review of Systems  Constitutional: Negative.   HENT: Negative for congestion, ear pain and postnasal drip.   Eyes: Negative.   Respiratory: Negative for cough and shortness of breath.   Cardiovascular: Positive for leg swelling (2+ pitting edema on the Right, 1+ pitting edema on the Left). Negative for chest pain.  Gastrointestinal: Negative for nausea, abdominal pain and constipation.  Endocrine: Negative.   Genitourinary: Negative for dysuria and frequency.  Musculoskeletal: Negative for back pain.  Neurological: Negative for dizziness and headaches.  Psychiatric/Behavioral: Negative.        Objective:   Physical Exam  Nursing note and vitals reviewed. Constitutional: He is oriented to person, place, and time. He appears well-developed and well-nourished.  Morbidly obese but alert and cooperative  HENT:  Head: Normocephalic and atraumatic.  Nose: Nose  normal.  Mouth/Throat: Oropharynx is clear and moist. No oropharyngeal exudate.  Eyes: Conjunctivae and EOM are normal. Pupils are equal, round, and reactive to light. Right eye exhibits no discharge. Left eye exhibits no discharge. No scleral icterus.  Neck: Normal range of motion. Neck supple. No tracheal deviation present. No thyromegaly present.  Cardiovascular: Normal rate and normal heart sounds.   No murmur heard. Irregular at 60 per minute  Pulmonary/Chest: Effort normal and breath sounds normal. No respiratory  distress. He has no wheezes. He has no rales. He exhibits no tenderness.  Abdominal: Soft. Bowel sounds are normal. He exhibits no mass. There is no tenderness. There is no rebound and no guarding.  Morbidly obese, patient was examined and his motorized wheelchair.  Musculoskeletal: He exhibits edema. He exhibits no tenderness.  The patient has minimal grip with his right hand and only slight movement of his right foot.  Lymphadenopathy:    He has no cervical adenopathy.  Neurological: He is alert and oriented to person, place, and time.  Skin: Skin is warm and dry. No rash noted. No erythema. No pallor.  Psychiatric: He has a normal mood and affect. His behavior is normal. Judgment and thought content normal.   BP 108/68  Pulse 62  Face-to-face visit time 30 minutes       Assessment & Plan:  1. Hyperlipemia - Hepatic function panel; Future - Lipid panel; Future  2. Essential hypertension - POCT CBC - BMP8+EGFR; Future  3. Vitamin D deficiency - Vit D  25 hydroxy (rtn osteoporosis monitoring); Future  4. Paralysis of right lower extremity  5. Hemiparesis affecting right side as late effect of cerebrovascular accident  6. Hypothyroidism due to acquired atrophy of thyroid  7. Chronic atrial fibrillation  8. Morbid obesity  Patient Instructions  Continue to be careful and did not put yourself at risk for falling Watch sodium intake Continue to monitor protimes because of taking Coumadin Return to clinic if possible for lab work and Prevnar--if unable to come to clinic call and we will send one to the home to do the shot and draw the blood work Continue current medication   Arrie Senate MD

## 2013-09-11 NOTE — Patient Instructions (Signed)
Continue to be careful and did not put yourself at risk for falling Watch sodium intake Continue to monitor protimes because of taking Coumadin Return to clinic if possible for lab work and Prevnar--if unable to come to clinic call and we will send one to the home to do the shot and draw the blood work Continue current medication

## 2013-09-13 ENCOUNTER — Other Ambulatory Visit: Payer: Self-pay | Admitting: Nurse Practitioner

## 2013-09-13 MED ORDER — POTASSIUM CHLORIDE CRYS ER 20 MEQ PO TBCR
EXTENDED_RELEASE_TABLET | ORAL | Status: DC
Start: 2013-09-13 — End: 2014-07-22

## 2013-09-13 MED ORDER — METOPROLOL TARTRATE 50 MG PO TABS: ORAL_TABLET | ORAL | Status: DC

## 2013-09-13 MED ORDER — LOSARTAN POTASSIUM 100 MG PO TABS: ORAL_TABLET | ORAL | Status: DC

## 2013-09-13 MED ORDER — GABAPENTIN 300 MG PO CAPS: ORAL_CAPSULE | ORAL | Status: DC

## 2013-09-13 MED ORDER — WARFARIN SODIUM 7.5 MG PO TABS: 7.5000 mg | ORAL_TABLET | Freq: Every day | ORAL | Status: DC

## 2013-09-13 MED ORDER — AMLODIPINE BESYLATE 5 MG PO TABS: ORAL_TABLET | ORAL | Status: DC

## 2013-09-13 MED ORDER — ATORVASTATIN CALCIUM 20 MG PO TABS: ORAL_TABLET | ORAL | Status: DC

## 2013-09-13 MED ORDER — LEVOTHYROXINE SODIUM 125 MCG PO TABS: ORAL_TABLET | ORAL | Status: DC

## 2013-09-13 MED ORDER — ALLOPURINOL 300 MG PO TABS: ORAL_TABLET | ORAL | Status: DC

## 2013-09-14 ENCOUNTER — Other Ambulatory Visit: Payer: Self-pay | Admitting: Family Medicine

## 2013-09-16 ENCOUNTER — Ambulatory Visit (INDEPENDENT_AMBULATORY_CARE_PROVIDER_SITE_OTHER): Payer: Medicare Other | Admitting: Pharmacist

## 2013-09-16 DIAGNOSIS — I4891 Unspecified atrial fibrillation: Secondary | ICD-10-CM

## 2013-09-16 NOTE — Progress Notes (Signed)
Patient checks INR at home with Home INR monitoring.  Billing once per month interupertation fee.  Patient diagnosis - A.fib  Procedure code if G0250

## 2013-09-18 LAB — POCT INR: INR: 3

## 2013-09-19 ENCOUNTER — Ambulatory Visit (INDEPENDENT_AMBULATORY_CARE_PROVIDER_SITE_OTHER): Payer: Medicare Other | Admitting: Pharmacist

## 2013-09-19 ENCOUNTER — Ambulatory Visit: Payer: Self-pay | Admitting: Pharmacist

## 2013-09-25 ENCOUNTER — Ambulatory Visit (INDEPENDENT_AMBULATORY_CARE_PROVIDER_SITE_OTHER): Payer: Medicare Other | Admitting: Pharmacist

## 2013-09-25 DIAGNOSIS — I4891 Unspecified atrial fibrillation: Secondary | ICD-10-CM | POA: Diagnosis not present

## 2013-09-25 LAB — POCT INR: INR: 2.8

## 2013-10-14 ENCOUNTER — Telehealth: Payer: Self-pay | Admitting: Pharmacist Clinician (PhC)/ Clinical Pharmacy Specialist

## 2013-10-14 NOTE — Telephone Encounter (Signed)
Called patienth his INR result 2.2.  There have been no medication changes, missed doses, or signs and symptoms of bleeding.  COntinue same directions and recheck in 1 week.

## 2013-10-23 LAB — POCT INR: INR: 2.2

## 2013-10-29 ENCOUNTER — Ambulatory Visit (INDEPENDENT_AMBULATORY_CARE_PROVIDER_SITE_OTHER): Payer: Medicare Other | Admitting: Pharmacist

## 2013-10-29 NOTE — Progress Notes (Signed)
No charge for labs or visit - INR check through home monitoring system  

## 2013-11-02 DIAGNOSIS — I4891 Unspecified atrial fibrillation: Secondary | ICD-10-CM | POA: Diagnosis not present

## 2013-11-02 LAB — POCT INR: INR: 2.2

## 2013-11-03 ENCOUNTER — Ambulatory Visit (INDEPENDENT_AMBULATORY_CARE_PROVIDER_SITE_OTHER): Payer: Medicare Other | Admitting: Pharmacist

## 2013-11-03 NOTE — Progress Notes (Signed)
No charge for labs or visit - INR check through home monitoring system  

## 2013-11-06 LAB — POCT INR: INR: 2.3

## 2013-11-11 ENCOUNTER — Ambulatory Visit (INDEPENDENT_AMBULATORY_CARE_PROVIDER_SITE_OTHER): Payer: Medicare Other | Admitting: Pharmacist

## 2013-11-11 DIAGNOSIS — I4891 Unspecified atrial fibrillation: Secondary | ICD-10-CM | POA: Diagnosis not present

## 2013-11-11 NOTE — Progress Notes (Signed)
Patient called regarding/  INR results on 11/07/13  Patient checks INR at home with Home INR monitoring.  Billing once per month interupertation fee.  Patient diagnosis - atrial fibrillation Procedure code if G0250

## 2013-11-14 ENCOUNTER — Other Ambulatory Visit: Payer: Self-pay | Admitting: Pharmacist

## 2013-11-14 LAB — POCT INR: INR: 2.2

## 2013-11-14 NOTE — Progress Notes (Unsigned)
No charge for labs or visit - INR check through home monitoring system  

## 2013-11-21 LAB — POCT INR: INR: 3.8

## 2013-11-24 ENCOUNTER — Telehealth: Payer: Self-pay | Admitting: Family Medicine

## 2013-11-24 ENCOUNTER — Ambulatory Visit (INDEPENDENT_AMBULATORY_CARE_PROVIDER_SITE_OTHER): Payer: Medicare Other | Admitting: Pharmacist

## 2013-11-24 NOTE — Telephone Encounter (Signed)
Nasal congestion and cough for a few days. He hasn't taken anything OTC. Suggested he try Coricidin BP and to treat the symptoms for a couple of days before we start him on an antibiotic. It would also be better to come in for an evaluation instead of prescribing something over the phone.  He will call back if symptoms worsen or persist.

## 2013-11-27 ENCOUNTER — Ambulatory Visit (INDEPENDENT_AMBULATORY_CARE_PROVIDER_SITE_OTHER): Payer: Medicare Other | Admitting: Family

## 2013-11-27 ENCOUNTER — Encounter: Payer: Self-pay | Admitting: Family

## 2013-11-27 VITALS — BP 115/74 | HR 62 | Temp 98.3°F

## 2013-11-27 DIAGNOSIS — J069 Acute upper respiratory infection, unspecified: Secondary | ICD-10-CM | POA: Diagnosis not present

## 2013-11-27 MED ORDER — BENZONATATE 200 MG PO CAPS: 200.0000 mg | ORAL_CAPSULE | Freq: Three times a day (TID) | ORAL | Status: DC | PRN

## 2013-11-27 MED ORDER — AMOXICILLIN 500 MG PO CAPS: 500.0000 mg | ORAL_CAPSULE | Freq: Two times a day (BID) | ORAL | Status: DC

## 2013-11-27 NOTE — Patient Instructions (Signed)
Upper Respiratory Infection, Adult An upper respiratory infection (URI) is also known as the common cold. It is often caused by a type of germ (virus). Colds are easily spread (contagious). You can pass it to others by kissing, coughing, sneezing, or drinking out of the same glass. Usually, you get better in 1 or 2 weeks.  HOME CARE   Only take medicine as told by your doctor.  Use a warm mist humidifier or breathe in steam from a hot shower.  Drink enough water and fluids to keep your pee (urine) clear or pale yellow.  Get plenty of rest.  Return to work when your temperature is back to normal or as told by your doctor. You may use a face mask and wash your hands to stop your cold from spreading. GET HELP RIGHT AWAY IF:   After the first few days, you feel you are getting worse.  You have questions about your medicine.  You have chills, shortness of breath, or brown or red spit (mucus).  You have yellow or brown snot (nasal discharge) or pain in the face, especially when you bend forward.  You have a fever, puffy (swollen) neck, pain when you swallow, or white spots in the back of your throat.  You have a bad headache, ear pain, sinus pain, or chest pain.  You have a high-pitched whistling sound when you breathe in and out (wheezing).  You have a lasting cough or cough up blood.  You have sore muscles or a stiff neck. MAKE SURE YOU:   Understand these instructions.  Will watch your condition.  Will get help right away if you are not doing well or get worse. Document Released: 08/09/2007 Document Revised: 05/15/2011 Document Reviewed: 05/28/2013 [employer] Patient Information 2015 [employer]. This information is not intended to replace advice given to you by your health care provider. Make sure you discuss any questions you have with your health care provider.  \ - Take meds as prescribed - Use a cool mist humidifier  -Use saline nose sprays frequently -Saline  irrigations of the nose can be very helpful if done frequently.  * 4X daily for 1 week*  * Use of a nettie pot can be helpful with this. Follow directions with this* -Force fluids -For any cough or congestion  Use plain Mucinex- regular strength or max strength is fine   * Children- consult with Pharmacist for dosing -For fever or aces or pains- take tylenol or ibuprofen appropriate for age and weight.  * for fevers greater than 101 orally you may alternate ibuprofen and tylenol every  3 hours. -Throat lozenges if help   Virgen Belland, FNP   

## 2013-11-27 NOTE — Progress Notes (Signed)
Subjective:    Patient ID: Omar [name], male    DOB: 01-12-1946, 68 y.o.   MRN: [id number]  Cough This is a new problem. The current episode started in the past 7 days ("three days ago"). The problem has been gradually improving. The problem occurs every few minutes. The cough is productive of purulent sputum. Associated symptoms include nasal congestion, postnasal drip and rhinorrhea. Pertinent negatives include no chills, ear congestion, ear pain, fever, headaches, hemoptysis, sore throat, shortness of breath or wheezing. He has tried nothing for the symptoms. The treatment provided no relief. There is no history of asthma or COPD.      Review of Systems  Constitutional: Negative.  Negative for fever and chills.  HENT: Positive for postnasal drip and rhinorrhea. Negative for ear pain and sore throat.   Respiratory: Positive for cough. Negative for hemoptysis, shortness of breath and wheezing.   Cardiovascular: Negative.   Gastrointestinal: Negative.   Endocrine: Negative.   Genitourinary: Negative.   Musculoskeletal: Negative.   Neurological: Negative.  Negative for headaches.  Hematological: Negative.   Psychiatric/Behavioral: Negative.   All other systems reviewed and are negative.      Objective:   Physical Exam  Vitals reviewed. Constitutional: He is oriented to person, place, and time. He appears well-developed and well-nourished. No distress.  HENT:  Head: Normocephalic.  Right Ear: External ear normal.  Left Ear: External ear normal.  Nasal passage erythemas with mild swelling Oropharynx erythemas   Eyes: Pupils are equal, round, and reactive to light. Right eye exhibits no discharge. Left eye exhibits no discharge.  Neck: Normal range of motion. Neck supple. No thyromegaly present.  Cardiovascular: Normal rate, regular rhythm, normal heart sounds and intact distal pulses.   No murmur heard. Pulmonary/Chest: Effort normal. No respiratory distress. He has no  wheezes.  Diminished breath sounds bilaterally   Abdominal: Soft. Bowel sounds are normal. He exhibits no distension. There is no tenderness.  Musculoskeletal: Normal range of motion. He exhibits no edema and no tenderness.  Neurological: He is alert and oriented to person, place, and time. He has normal reflexes. No cranial nerve deficit.  Skin: Skin is warm and dry. No rash noted. No erythema.  Psychiatric: He has a normal mood and affect. His behavior is normal. Judgment and thought content normal.     BP 115/74  Pulse 62  Temp(Src) 98.3 F (36.8 C) (Oral)      Assessment & Plan:  1. URI (upper respiratory infection) -- Take meds as prescribed - Use a cool mist humidifier  -Use saline nose sprays frequently -Saline irrigations of the nose can be very helpful if done frequently.  * 4X daily for 1 week*  * Use of a nettie pot can be helpful with this. Follow directions with this* -Force fluids -For any cough or congestion  Use plain Mucinex- regular strength or max strength is fine   * Children- consult with Pharmacist for dosing -For fever or aces or pains- take tylenol or ibuprofen appropriate for age and weight.  * for fevers greater than 101 orally you may alternate ibuprofen and tylenol every  3 hours. -Throat lozenges if helps - amoxicillin (AMOXIL) 500 MG capsule; Take 1 capsule (500 mg total) by mouth 2 (two) times daily.  Dispense: 20 capsule; Refill: 0 - benzonatate (TESSALON) 200 MG capsule; Take 1 capsule (200 mg total) by mouth 3 (three) times daily as needed.  Dispense: 30 capsule; Refill: 1  Jannifer Rodney, FNP

## 2013-12-04 ENCOUNTER — Ambulatory Visit: Payer: Self-pay

## 2013-12-04 ENCOUNTER — Ambulatory Visit (INDEPENDENT_AMBULATORY_CARE_PROVIDER_SITE_OTHER): Payer: Medicare Other | Admitting: Pharmacist

## 2013-12-04 DIAGNOSIS — I482 Chronic atrial fibrillation, unspecified: Secondary | ICD-10-CM

## 2013-12-04 LAB — POCT INR: INR: 2.4

## 2013-12-04 NOTE — Progress Notes (Signed)
Patient checks INR at home with Home INR monitoring.  Billing once per month interupertation fee.  Patient diagnosis - A.Fib Procedure code if G0250  

## 2013-12-11 DIAGNOSIS — I4891 Unspecified atrial fibrillation: Secondary | ICD-10-CM | POA: Diagnosis not present

## 2013-12-11 LAB — POCT INR: INR: 2.4

## 2013-12-15 ENCOUNTER — Ambulatory Visit (INDEPENDENT_AMBULATORY_CARE_PROVIDER_SITE_OTHER): Payer: Medicare Other | Admitting: Pharmacist

## 2013-12-15 ENCOUNTER — Ambulatory Visit (INDEPENDENT_AMBULATORY_CARE_PROVIDER_SITE_OTHER): Payer: Medicare Other

## 2013-12-15 DIAGNOSIS — I10 Essential (primary) hypertension: Secondary | ICD-10-CM | POA: Diagnosis not present

## 2013-12-15 DIAGNOSIS — E785 Hyperlipidemia, unspecified: Secondary | ICD-10-CM | POA: Diagnosis not present

## 2013-12-15 DIAGNOSIS — Z23 Encounter for immunization: Secondary | ICD-10-CM | POA: Diagnosis not present

## 2013-12-15 DIAGNOSIS — E559 Vitamin D deficiency, unspecified: Secondary | ICD-10-CM | POA: Diagnosis not present

## 2013-12-15 NOTE — Addendum Note (Signed)
Addended by: Prescott GumLAND, Roan Sawchuk M on: 12/15/2013 09:30 AM   Modules accepted: Orders

## 2013-12-16 LAB — BMP8+EGFR
BUN/Creatinine Ratio: 20 (ref 10–22)
BUN: 24 mg/dL (ref 8–27)
CHLORIDE: 100 mmol/L (ref 97–108)
CO2: 26 mmol/L (ref 18–29)
Calcium: 9.2 mg/dL (ref 8.6–10.2)
Creatinine, Ser: 1.23 mg/dL (ref 0.76–1.27)
GFR calc non Af Amer: 60 mL/min/{1.73_m2} (ref >59)
GFR, EST AFRICAN AMERICAN: 69 mL/min/{1.73_m2} (ref >59)
GLUCOSE: 91 mg/dL (ref 65–99)
Potassium: 4.7 mmol/L (ref 3.5–5.2)
Sodium: 140 mmol/L (ref 134–144)

## 2013-12-16 LAB — VITAMIN D 25 HYDROXY (VIT D DEFICIENCY, FRACTURES): Vit D, 25-Hydroxy: 35.3 ng/mL (ref 30.0–100.0)

## 2013-12-16 LAB — HEPATIC FUNCTION PANEL
ALBUMIN: 4 g/dL (ref 3.6–4.8)
ALT: 8 IU/L (ref 0–44)
AST: 15 IU/L (ref 0–40)
Alkaline Phosphatase: 81 IU/L (ref 39–117)
Bilirubin, Direct: 0.18 mg/dL (ref 0.00–0.40)
TOTAL PROTEIN: 8.8 g/dL — AB (ref 6.0–8.5)
Total Bilirubin: 0.7 mg/dL (ref 0.0–1.2)

## 2013-12-16 LAB — LIPID PANEL
Chol/HDL Ratio: 2.4 ratio units (ref 0.0–5.0)
Cholesterol, Total: 97 mg/dL — ABNORMAL LOW (ref 100–199)
HDL: 41 mg/dL (ref >39)
LDL CALC: 48 mg/dL (ref 0–99)
TRIGLYCERIDES: 40 mg/dL (ref 0–149)
VLDL Cholesterol Cal: 8 mg/dL (ref 5–40)

## 2013-12-18 LAB — POCT INR: INR: 2.3

## 2013-12-23 ENCOUNTER — Other Ambulatory Visit (INDEPENDENT_AMBULATORY_CARE_PROVIDER_SITE_OTHER): Payer: Medicare Other

## 2013-12-23 ENCOUNTER — Telehealth: Payer: Self-pay | Admitting: Family Medicine

## 2013-12-23 ENCOUNTER — Other Ambulatory Visit: Payer: Self-pay | Admitting: Family Medicine

## 2013-12-23 DIAGNOSIS — N390 Urinary tract infection, site not specified: Secondary | ICD-10-CM | POA: Diagnosis not present

## 2013-12-23 DIAGNOSIS — R319 Hematuria, unspecified: Secondary | ICD-10-CM | POA: Diagnosis not present

## 2013-12-23 LAB — POCT UA - MICROSCOPIC ONLY
Bacteria, U Microscopic: NEGATIVE
CASTS, UR, LPF, POC: NEGATIVE
Crystals, Ur, HPF, POC: NEGATIVE
MUCUS UA: NEGATIVE
YEAST UA: NEGATIVE

## 2013-12-23 LAB — POCT URINALYSIS DIPSTICK
Bilirubin, UA: NEGATIVE
Glucose, UA: NEGATIVE
KETONES UA: NEGATIVE
NITRITE UA: NEGATIVE
PROTEIN UA: NEGATIVE
Spec Grav, UA: 1.015
Urobilinogen, UA: >=8
pH, UA: 6

## 2013-12-23 MED ORDER — DOXYCYCLINE HYCLATE 100 MG PO TABS: 100.0000 mg | ORAL_TABLET | Freq: Two times a day (BID) | ORAL | Status: DC

## 2013-12-23 NOTE — Telephone Encounter (Signed)
States that Omar [name] has a UTI. Wants to know if you will call meds in . States that he can have somebody bring in a specimen if need be. Please advise

## 2013-12-23 NOTE — Telephone Encounter (Signed)
Please bring urine specimen for urinalysis and culture and sensitivity Once we see the urinalysis result we can call in an antibiotic that would not interfere with any other medicine he is taking

## 2013-12-23 NOTE — Telephone Encounter (Signed)
Omar [name] aware to bring in specimen

## 2013-12-23 NOTE — Progress Notes (Signed)
Pt aware [facility]

## 2013-12-23 NOTE — Progress Notes (Signed)
Lab only 

## 2013-12-25 ENCOUNTER — Ambulatory Visit (INDEPENDENT_AMBULATORY_CARE_PROVIDER_SITE_OTHER): Payer: Medicare Other | Admitting: Pharmacist

## 2013-12-25 LAB — POCT INR: INR: 2.4

## 2013-12-26 ENCOUNTER — Telehealth: Payer: Self-pay | Admitting: Pharmacist

## 2013-12-26 LAB — URINE CULTURE

## 2013-12-26 MED ORDER — SULFAMETHOXAZOLE-TMP DS 800-160 MG PO TABS: 1.0000 | ORAL_TABLET | Freq: Two times a day (BID) | ORAL | Status: DC

## 2013-12-26 NOTE — Telephone Encounter (Signed)
Warfarin adjusted. Rx sent in for Septra as requested and patient advised to stop doxycycline.  Recheck INR next week .  Was 2.4 today per his daughter Eual FinesJavona.

## 2013-12-26 NOTE — Telephone Encounter (Signed)
Patient has been prescribed Septra DS for infection.  While taking Septra DS take warfarin 7.5mg  1/2 tablet daily.  Continue to check INR weekly at home.  Patient and his daughter notified.

## 2013-12-26 NOTE — Telephone Encounter (Signed)
Message copied by Henrene PastorECKARD, Jamarri Vuncannon on Fri Dec 26, 2013  2:26 PM ------      Message from: Magdalene RiverBULLINS, JAMIE H      Created: Fri Dec 26, 2013 10:12 AM       Robbi Scurlock - please address if you feel we need to adjust coumadin while on sulfa            When addressed - please send to pool A ------

## 2013-12-29 ENCOUNTER — Other Ambulatory Visit: Payer: Self-pay | Admitting: Pharmacist

## 2013-12-29 NOTE — Progress Notes (Signed)
No charge for labs or visit - INR check through home monitoring system  

## 2014-01-01 LAB — POCT INR: INR: 2.7

## 2014-01-05 ENCOUNTER — Ambulatory Visit (INDEPENDENT_AMBULATORY_CARE_PROVIDER_SITE_OTHER): Payer: Medicare Other | Admitting: Pharmacist

## 2014-01-05 ENCOUNTER — Other Ambulatory Visit: Payer: Self-pay | Admitting: Nurse Practitioner

## 2014-01-05 NOTE — Progress Notes (Signed)
No charge for labs or visit - INR check through home monitoring system  

## 2014-01-08 DIAGNOSIS — I4891 Unspecified atrial fibrillation: Secondary | ICD-10-CM | POA: Diagnosis not present

## 2014-01-09 ENCOUNTER — Other Ambulatory Visit: Payer: Self-pay | Admitting: Nurse Practitioner

## 2014-01-09 LAB — POCT INR: INR: 1.8

## 2014-01-12 ENCOUNTER — Ambulatory Visit (INDEPENDENT_AMBULATORY_CARE_PROVIDER_SITE_OTHER): Payer: Medicare Other | Admitting: Pharmacist

## 2014-01-12 DIAGNOSIS — I482 Chronic atrial fibrillation, unspecified: Secondary | ICD-10-CM

## 2014-01-12 NOTE — Progress Notes (Signed)
Patient checks INR at home with Home INR monitoring.  Billing once per month interupertation fee.  Patient diagnosis - Atrial fibrillation, permanent / chronic Procedure code if G0250

## 2014-01-15 LAB — POCT INR: INR: 1.7

## 2014-01-19 ENCOUNTER — Ambulatory Visit (INDEPENDENT_AMBULATORY_CARE_PROVIDER_SITE_OTHER): Payer: Medicare Other | Admitting: Pharmacist

## 2014-01-19 ENCOUNTER — Telehealth: Payer: Self-pay | Admitting: *Deleted

## 2014-01-19 NOTE — Telephone Encounter (Signed)
The lab called in with a Stat PT/INR of 1.7, he had it drawn on 01/15/14.

## 2014-01-19 NOTE — Telephone Encounter (Signed)
Patient called - instructed to take 1 and 1/2 tablets today then resume regular dose - recheck INR 01/22/2014.

## 2014-01-22 LAB — POCT INR: INR: 2.9

## 2014-01-23 ENCOUNTER — Ambulatory Visit (INDEPENDENT_AMBULATORY_CARE_PROVIDER_SITE_OTHER): Payer: Medicare Other | Admitting: Pharmacist

## 2014-01-30 LAB — POCT INR: INR: 1.9

## 2014-02-04 ENCOUNTER — Ambulatory Visit: Payer: Medicare Other | Admitting: Pharmacist

## 2014-02-04 ENCOUNTER — Telehealth: Payer: Self-pay | Admitting: Pharmacist

## 2014-02-04 ENCOUNTER — Ambulatory Visit (INDEPENDENT_AMBULATORY_CARE_PROVIDER_SITE_OTHER): Payer: Medicare Other | Admitting: Pharmacist

## 2014-02-04 DIAGNOSIS — I482 Chronic atrial fibrillation: Secondary | ICD-10-CM

## 2014-02-04 DIAGNOSIS — I4891 Unspecified atrial fibrillation: Secondary | ICD-10-CM | POA: Diagnosis not present

## 2014-02-04 LAB — POCT INR: INR: 1.7

## 2014-02-04 NOTE — Telephone Encounter (Signed)
See anticoagulation notes.  Increase warfarin to 7.5mg  1 tablet on mondays, wednessdays and fridays and 1/2 tablet all other days.  recheck INR with INR Home Monitor in 1 week.  Patient's daughter notified.

## 2014-02-04 NOTE — Progress Notes (Signed)
Patient checks INR at home with Home INR monitoring.  Billing once per month interupertation fee.  Patient diagnosis - atrial fibrillation Procedure code if G0250  

## 2014-02-13 LAB — POCT INR: INR: 2.2

## 2014-02-18 ENCOUNTER — Ambulatory Visit (INDEPENDENT_AMBULATORY_CARE_PROVIDER_SITE_OTHER): Payer: Medicare Other | Admitting: Pharmacist

## 2014-02-18 NOTE — Progress Notes (Signed)
No charge for labs or visit - INR check through home monitoring system  

## 2014-02-22 LAB — POCT INR: INR: 2.2

## 2014-02-23 ENCOUNTER — Ambulatory Visit: Payer: Self-pay | Admitting: Pharmacist

## 2014-02-23 NOTE — Progress Notes (Signed)
Opened in error

## 2014-02-23 NOTE — Progress Notes (Signed)
No charge for labs or visit - INR check through home monitoring system  

## 2014-03-12 LAB — PROTIME-INR

## 2014-03-19 ENCOUNTER — Other Ambulatory Visit: Payer: Self-pay | Admitting: Nurse Practitioner

## 2014-03-19 DIAGNOSIS — I4891 Unspecified atrial fibrillation: Secondary | ICD-10-CM | POA: Diagnosis not present

## 2014-03-19 LAB — PROTIME-INR

## 2014-03-21 ENCOUNTER — Other Ambulatory Visit: Payer: Self-pay | Admitting: Nurse Practitioner

## 2014-03-26 LAB — POCT INR: INR: 1.9

## 2014-03-30 ENCOUNTER — Ambulatory Visit (INDEPENDENT_AMBULATORY_CARE_PROVIDER_SITE_OTHER): Payer: Medicare Other | Admitting: Pharmacist

## 2014-03-30 DIAGNOSIS — I482 Chronic atrial fibrillation: Secondary | ICD-10-CM | POA: Diagnosis not present

## 2014-04-03 LAB — POCT INR: INR: 2.1

## 2014-04-06 ENCOUNTER — Ambulatory Visit (INDEPENDENT_AMBULATORY_CARE_PROVIDER_SITE_OTHER): Payer: Medicare Other | Admitting: Pharmacist

## 2014-04-06 DIAGNOSIS — I482 Chronic atrial fibrillation: Secondary | ICD-10-CM

## 2014-04-06 NOTE — Progress Notes (Signed)
Patient checks INR at home with Home INR monitoring.  Billing once per month interupertation fee.  Patient diagnosis - atrial fibrillation Procedure code if G0250  

## 2014-04-10 LAB — POCT INR: INR: 2.3

## 2014-04-13 ENCOUNTER — Ambulatory Visit (INDEPENDENT_AMBULATORY_CARE_PROVIDER_SITE_OTHER): Payer: Medicare Other | Admitting: Pharmacist

## 2014-04-13 NOTE — Progress Notes (Signed)
No charge for labs or visit - INR check through home monitoring system  

## 2014-04-16 ENCOUNTER — Other Ambulatory Visit: Payer: Self-pay | Admitting: Family Medicine

## 2014-04-16 ENCOUNTER — Other Ambulatory Visit: Payer: Self-pay | Admitting: Nurse Practitioner

## 2014-04-17 DIAGNOSIS — I4891 Unspecified atrial fibrillation: Secondary | ICD-10-CM | POA: Diagnosis not present

## 2014-04-20 ENCOUNTER — Other Ambulatory Visit: Payer: Self-pay | Admitting: Nurse Practitioner

## 2014-04-21 ENCOUNTER — Other Ambulatory Visit: Payer: Self-pay | Admitting: Nurse Practitioner

## 2014-05-04 ENCOUNTER — Ambulatory Visit (INDEPENDENT_AMBULATORY_CARE_PROVIDER_SITE_OTHER): Payer: Medicare Other | Admitting: Family Medicine

## 2014-05-04 ENCOUNTER — Encounter: Payer: Self-pay | Admitting: Family Medicine

## 2014-05-04 VITALS — BP 117/81 | HR 59 | Temp 97.7°F

## 2014-05-04 DIAGNOSIS — J069 Acute upper respiratory infection, unspecified: Secondary | ICD-10-CM

## 2014-05-04 DIAGNOSIS — E034 Atrophy of thyroid (acquired): Secondary | ICD-10-CM | POA: Diagnosis not present

## 2014-05-04 DIAGNOSIS — Z79899 Other long term (current) drug therapy: Secondary | ICD-10-CM | POA: Diagnosis not present

## 2014-05-04 DIAGNOSIS — I482 Chronic atrial fibrillation, unspecified: Secondary | ICD-10-CM

## 2014-05-04 DIAGNOSIS — E559 Vitamin D deficiency, unspecified: Secondary | ICD-10-CM | POA: Diagnosis not present

## 2014-05-04 DIAGNOSIS — E0789 Other specified disorders of thyroid: Secondary | ICD-10-CM | POA: Diagnosis not present

## 2014-05-04 DIAGNOSIS — J209 Acute bronchitis, unspecified: Secondary | ICD-10-CM | POA: Diagnosis not present

## 2014-05-04 DIAGNOSIS — E038 Other specified hypothyroidism: Secondary | ICD-10-CM

## 2014-05-04 DIAGNOSIS — D696 Thrombocytopenia, unspecified: Secondary | ICD-10-CM | POA: Insufficient documentation

## 2014-05-04 DIAGNOSIS — G8311 Monoplegia of lower limb affecting right dominant side: Secondary | ICD-10-CM | POA: Diagnosis not present

## 2014-05-04 DIAGNOSIS — I10 Essential (primary) hypertension: Secondary | ICD-10-CM | POA: Diagnosis not present

## 2014-05-04 DIAGNOSIS — E785 Hyperlipidemia, unspecified: Secondary | ICD-10-CM

## 2014-05-04 LAB — POCT CBC
GRANULOCYTE PERCENT: 63.9 % (ref 37–80)
HCT, POC: 47.5 % (ref 43.5–53.7)
Hemoglobin: 14.5 g/dL (ref 14.1–18.1)
Lymph, poc: 1.3 (ref 0.6–3.4)
MCH, POC: 30.4 pg (ref 27–31.2)
MCHC: 30.6 g/dL — AB (ref 31.8–35.4)
MCV: 99.5 fL — AB (ref 80–97)
MPV: 8.5 fL (ref 0–99.8)
POC GRANULOCYTE: 2.6 (ref 2–6.9)
POC LYMPH PERCENT: 32.4 %L (ref 10–50)
Platelet Count, POC: 106 10*3/uL — AB (ref 142–424)
RBC: 4.78 M/uL (ref 4.69–6.13)
RDW, POC: 15.2 %
WBC: 4 10*3/uL — AB (ref 4.6–10.2)

## 2014-05-04 MED ORDER — AMOXICILLIN 500 MG PO CAPS: 500.0000 mg | ORAL_CAPSULE | Freq: Three times a day (TID) | ORAL | Status: DC

## 2014-05-04 NOTE — Patient Instructions (Addendum)
Medicare Annual Wellness Visit  [place] and the medical providers at [facility] strive to bring you the best medical care.  In doing so we not only want to address your current medical conditions and concerns but also to detect new conditions early and prevent illness, disease and health-related problems.    Medicare offers a yearly Wellness Visit which allows our clinical staff to assess your need for preventative services including immunizations, lifestyle education, counseling to decrease risk of preventable diseases and screening for fall risk and other medical concerns.    This visit is provided free of charge (no copay) for all Medicare recipients. The clinical pharmacists at [facility] have begun to conduct these Wellness Visits which will also include a thorough review of all your medications.    As you primary medical provider recommend that you make an appointment for your Annual Wellness Visit if you have not done so already this year.  You may set up this appointment before you leave today or you may call back [phone]) and schedule an appointment.  Please make sure when you call that you mention that you are scheduling your Annual Wellness Visit with the clinical pharmacist so that the appointment may be made for the proper length of time.     Continue current medications. Continue good therapeutic lifestyle changes which include good diet and exercise. Fall precautions discussed with patient. If an FOBT was given today- please return it to our front desk. If you are over 71 years old - you may need Prevnar 13 or the adult Pneumonia vaccine.  Flu Shots are still available at our office. If you still haven't had one please call to set up a nurse visit to get one.   After your visit with us today you will receive a survey in the mail or online from [employer] regarding your care with us. Please take a moment to  fill this out. Your feedback is very important to us as you can help us better understand your patient needs as well as improve your experience and satisfaction. WE CARE ABOUT YOU!!!   The patient should drink plenty of liquids especially water He should use a cool mist humidifier at home and keep the home is cool as possible Take Mucinex maximum strength, blue and white in color, 1 twice daily for cough and congestion with a large glass of water Take antibiotic as directed Use nasal saline Continue other medicines as currently doing The patient needs to get a Prevnar vaccine when he is feeling better.

## 2014-05-04 NOTE — Progress Notes (Signed)
Subjective:    Patient ID: Omar [name], male    DOB: [birth year], 69 y.o.   MRN: [id number]  HPI  Pt here for follow up and management of chronic medical problems which includes atrial fibrillation, hypothyroid, hypertension, and hyperlipidemia. He is taking medications regularly. The patient claims today in his motorized wheelchair and he complains of cough Congestion and drainage. The patient comes to the visit today with his grandson also. Bili has a history of a major stroke several years ago and has been homebound since that time other than with his rolling wheelchair. He is affected on the right side with decreased use of the right hand and right leg. He has morbid obesity. Today he is in good spirits and has no other complaints other than his chest congestion and cough. He has a history of chronic atrial fibrillation and does home monitoring for his pro times.         Patient Active Problem List   Diagnosis Date Noted  . 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 Hemiparesis affecting right side as late effect of cerebrovascular accident 09/11/2013  . Hypothyroidism 09/11/2013  . History of CVA (cerebrovascular accident) 04/17/2013  . Allergic rhinitis 06/06/2012  . High risk medication use 06/06/2012  . Gout 06/06/2012  . Osteoarthrosis and allied disorders   . Hyperlipidemia   . Vitamin D deficiency   . Hypertension   . Asthma   . FIBRILLATION, ATRIAL 03/10/2009  . OBESITY, MORBID 03/02/2009   Outpatient Encounter Prescriptions as of 05/04/2014  Medication Sig  . albuterol (PROVENTIL HFA;VENTOLIN HFA) 108 (90 BASE) MCG/ACT inhaler Inhale 2 puffs into the lungs every 6 (six) hours as needed for wheezing or shortness of breath.  . allopurinol (ZYLOPRIM) 300 MG tablet TAKE 1 TABLET DAILY  . amLODipine (NORVASC) 5 MG tablet TAKE 1 AND 1/2 TABLETS DAILY  . atorvastatin (LIPITOR) 20 MG tablet TAKE 1 TABLET (20 MG TOTAL) BY MOUTH DAILY AT 6 PM.  . gabapentin  (NEURONTIN) 300 MG capsule TAKE 1 CAPSULE AT 8 AM 1 AT 6 PM AND 2 AT BEDTIME  . levothyroxine (SYNTHROID, LEVOTHROID) 125 MCG tablet TAKE 1 TABLET EVERY DAY  . losartan (COZAAR) 100 MG tablet TAKE 1 TABLET EVERY DAY  . metoprolol (LOPRESSOR) 50 MG tablet TAKE 1/2 TABLET 2 TIMES A DAY  . potassium chloride SA (KLOR-CON M20) 20 MEQ tablet TAKE 1 TABLET BY MOUTH ONCE A DAY WITH MEALS  . warfarin (COUMADIN) 7.5 MG tablet Take 1 tablet (7.5 mg total) by mouth daily at 6 PM.  . [DISCONTINUED] benzonatate (TESSALON) 200 MG capsule Take 1 capsule (200 mg total) by mouth 3 (three) times daily as needed.    Review of Systems  Constitutional: Negative.   HENT: Positive for congestion and postnasal drip.   Eyes: Negative.   Respiratory: Positive for cough (slight).   Cardiovascular: Negative.   Gastrointestinal: Negative.   Endocrine: Negative.   Genitourinary: Negative.   Musculoskeletal: Negative.   Skin: Negative.   Allergic/Immunologic: Negative.   Neurological: Negative.   Hematological: Negative.   Psychiatric/Behavioral: Negative.        Objective:   Physical Exam  Constitutional: He is oriented to person, place, and time. He appears well-developed and well-nourished. No distress.  Patient is alert and cooperative and in his motorized wheelchair. He comes to the visit with his grandson. He does have some slight grip strength with his right hand and slight movement of the right foot. This is axial better  than it was when seen at his home several months ago.  HENT:  Head: Normocephalic and atraumatic.  Right Ear: External ear normal.  Left Ear: External ear normal.  Mouth/Throat: Oropharynx is clear and moist. No oropharyngeal exudate.  There is nasal congestion bilaterally right greater than left  Eyes: Conjunctivae and EOM are normal. Pupils are equal, round, and reactive to light. Right eye exhibits no discharge. Left eye exhibits no discharge. No scleral icterus.  Neck: Normal  range of motion. Neck supple. No thyromegaly present.  There is no anterior cervical adenopathy or carotid bruits audible.  Cardiovascular: Normal rate and normal heart sounds.   No murmur heard. The heart is irregular irregular at 72/m  Pulmonary/Chest: Effort normal and breath sounds normal. No respiratory distress. He has no wheezes. He has no rales. He exhibits no tenderness.  The lungs are clear anteriorly and posteriorly with a dry cough  Abdominal: Soft. Bowel sounds are normal. He exhibits no mass. There is no tenderness. There is no rebound and no guarding.  The abdomen is morbidly obese. There is no tenderness or masses palpable.  Musculoskeletal: He exhibits no edema or tenderness.  Because of the stroke, the patient is confined to his wheelchair.  Lymphadenopathy:    He has no cervical adenopathy.  Neurological: He is alert and oriented to person, place, and time.  Right-sided weakness secondary to CVA  Skin: Skin is warm and dry. No rash noted.  Psychiatric: He has a normal mood and affect. His behavior is normal. Judgment and thought content normal.  Nursing note and vitals reviewed.  BP 117/81 mmHg  Pulse 59  Temp(Src) 97.7 F (36.5 C) (Other (Comment))  Ht   Wt         Assessment & Plan:  1. Chronic atrial fibrillation -Continue home pro times - POCT CBC  2. Essential hypertension -Blood pressure is under good control today. - POCT CBC - BMP8+EGFR - Hepatic function panel  3. Hyperlipemia -Lipid profile is pending from lab work today. Patient should continue with current treatment and with as aggressive dietary measures as possible - POCT CBC - Lipid panel  4. Vitamin D deficiency -Continue vitamin D3 pending results from lab work today - POCT CBC - Vit D  25 hydroxy (rtn osteoporosis monitoring)  5. Paralysis of right lower extremity -Continue mobility exercises daily - POCT CBC  6. Hypothyroidism due to acquired atrophy of thyroid -No change  in treatment pending results of lab work today - POCT CBC - Thyroid Panel With TSH  7. High risk medication use -Continue checking pro times at home - POCT CBC - BMP8+EGFR - Hepatic function panel - Thyroid Panel With TSH  8. URI (upper respiratory infection) -Drink plenty of fluids and take antibiotic as directed take Tylenol as needed for aches pains and fever and use Eucerin next and nasal saline - amoxicillin (AMOXIL) 500 MG capsule; Take 1 capsule (500 mg total) by mouth 3 (three) times daily.  Dispense: 30 capsule; Refill: 0  9. Acute bronchitis, unspecified organism -Use Mucinex and saline as directed above - amoxicillin (AMOXIL) 500 MG capsule; Take 1 capsule (500 mg total) by mouth 3 (three) times daily.  Dispense: 30 capsule; Refill: 0  Patient Instructions                       Medicare Annual Wellness Visit  [place] and the medical providers at [facility] strive to bring you the best  medical care.  In doing so we not only want to address your current medical conditions and concerns but also to detect new conditions early and prevent illness, disease and health-related problems.    Medicare offers a yearly Wellness Visit which allows our clinical staff to assess your need for preventative services including immunizations, lifestyle education, counseling to decrease risk of preventable diseases and screening for fall risk and other medical concerns.    This visit is provided free of charge (no copay) for all Medicare recipients. The clinical pharmacists at [facility] have begun to conduct these Wellness Visits which will also include a thorough review of all your medications.    As you primary medical provider recommend that you make an appointment for your Annual Wellness Visit if you have not done so already this year.  You may set up this appointment before you leave today or you may call back [phone]) and schedule an  appointment.  Please make sure when you call that you mention that you are scheduling your Annual Wellness Visit with the clinical pharmacist so that the appointment may be made for the proper length of time.     Continue current medications. Continue good therapeutic lifestyle changes which include good diet and exercise. Fall precautions discussed with patient. If an FOBT was given today- please return it to our front desk. If you are over 15 years old - you may need Prevnar 24 or the adult Pneumonia vaccine.  Flu Shots are still available at our office. If you still haven't had one please call to set up a nurse visit to get one.   After your visit with [place] today you will receive a survey in the mail or online from [employer] regarding your care with [place]. Please take a moment to fill this out. Your feedback is very important to [place] as you can help [place] better understand your patient needs as well as improve your experience and satisfaction. WE CARE ABOUT YOU!!!   The patient should drink plenty of liquids especially water He should use a cool mist humidifier at home and keep the home is cool as possible Take Mucinex maximum strength, blue and white in color, 1 twice daily for cough and congestion with a large glass of water Take antibiotic as directed Use nasal saline Continue other medicines as currently doing The patient needs to get a Prevnar vaccine when he is feeling better.   Arrie Senate MD

## 2014-05-05 LAB — LIPID PANEL
Chol/HDL Ratio: 2.6 ratio units (ref 0.0–5.0)
Cholesterol, Total: 97 mg/dL — ABNORMAL LOW (ref 100–199)
HDL: 38 mg/dL — AB (ref >39)
LDL CALC: 50 mg/dL (ref 0–99)
TRIGLYCERIDES: 44 mg/dL (ref 0–149)
VLDL Cholesterol Cal: 9 mg/dL (ref 5–40)

## 2014-05-05 LAB — BMP8+EGFR
BUN / CREAT RATIO: 20 (ref 10–22)
BUN: 18 mg/dL (ref 8–27)
CALCIUM: 9.1 mg/dL (ref 8.6–10.2)
CO2: 23 mmol/L (ref 18–29)
Chloride: 102 mmol/L (ref 97–108)
Creatinine, Ser: 0.91 mg/dL (ref 0.76–1.27)
GFR calc Af Amer: 98 mL/min/{1.73_m2} (ref >59)
GFR calc non Af Amer: 85 mL/min/{1.73_m2} (ref >59)
GLUCOSE: 68 mg/dL (ref 65–99)
Potassium: 4.3 mmol/L (ref 3.5–5.2)
SODIUM: 142 mmol/L (ref 134–144)

## 2014-05-05 LAB — HEPATIC FUNCTION PANEL
ALBUMIN: 4.5 g/dL (ref 3.5–4.8)
ALT: 12 IU/L (ref 0–44)
AST: 17 IU/L (ref 0–40)
Alkaline Phosphatase: 88 IU/L (ref 39–117)
Bilirubin Total: 0.7 mg/dL (ref 0.0–1.2)
Bilirubin, Direct: 0.15 mg/dL (ref 0.00–0.40)
Total Protein: 9 g/dL — ABNORMAL HIGH (ref 6.0–8.5)

## 2014-05-05 LAB — THYROID PANEL WITH TSH
FREE THYROXINE INDEX: 3 (ref 1.2–4.9)
T3 Uptake Ratio: 28 % (ref 24–39)
T4 TOTAL: 10.8 ug/dL (ref 4.5–12.0)
TSH: 1.61 u[IU]/mL (ref 0.450–4.500)

## 2014-05-05 LAB — VITAMIN D 25 HYDROXY (VIT D DEFICIENCY, FRACTURES): VIT D 25 HYDROXY: 31 ng/mL (ref 30.0–100.0)

## 2014-05-15 DIAGNOSIS — I4891 Unspecified atrial fibrillation: Secondary | ICD-10-CM | POA: Diagnosis not present

## 2014-05-15 LAB — POCT INR: INR: 2.3

## 2014-05-18 ENCOUNTER — Other Ambulatory Visit: Payer: Self-pay | Admitting: Nurse Practitioner

## 2014-05-21 ENCOUNTER — Ambulatory Visit (INDEPENDENT_AMBULATORY_CARE_PROVIDER_SITE_OTHER): Payer: Medicare Other | Admitting: Pharmacist

## 2014-05-21 DIAGNOSIS — I482 Chronic atrial fibrillation: Secondary | ICD-10-CM

## 2014-05-21 NOTE — Patient Instructions (Signed)
Patient checks INR at home with Home INR monitoring.  Billing once per month interupertation fee.  Patient diagnosis ATRIAL FIBRILLATION Procedure code if G0250

## 2014-05-22 ENCOUNTER — Other Ambulatory Visit: Payer: Medicare Other

## 2014-05-22 LAB — POCT INR: INR: 2.2

## 2014-05-22 NOTE — Progress Notes (Signed)
Pt had unclean urine sample dropped off called the patient told him to come and get a container and leave a clean sample and we would be glad to run

## 2014-05-23 ENCOUNTER — Other Ambulatory Visit: Payer: Self-pay | Admitting: Pharmacist

## 2014-05-23 ENCOUNTER — Other Ambulatory Visit: Payer: Medicare Other

## 2014-05-23 ENCOUNTER — Other Ambulatory Visit: Payer: Self-pay | Admitting: Family Medicine

## 2014-05-23 DIAGNOSIS — R300 Dysuria: Secondary | ICD-10-CM

## 2014-05-25 ENCOUNTER — Other Ambulatory Visit: Payer: Self-pay | Admitting: Pharmacist

## 2014-05-25 ENCOUNTER — Telehealth: Payer: Self-pay | Admitting: Family Medicine

## 2014-05-25 DIAGNOSIS — R300 Dysuria: Secondary | ICD-10-CM | POA: Diagnosis not present

## 2014-05-25 NOTE — Telephone Encounter (Signed)
Pt aware not back yet. Culture should be back by Tuesday.

## 2014-05-26 ENCOUNTER — Other Ambulatory Visit: Payer: Self-pay | Admitting: Nurse Practitioner

## 2014-05-27 LAB — URINE CULTURE

## 2014-06-01 ENCOUNTER — Ambulatory Visit (INDEPENDENT_AMBULATORY_CARE_PROVIDER_SITE_OTHER): Payer: Medicare Other | Admitting: Pharmacist

## 2014-06-01 LAB — POCT INR: INR: 1.6

## 2014-06-01 NOTE — Progress Notes (Signed)
No charge for labs or visit - INR check through home monitoring system  

## 2014-06-03 ENCOUNTER — Telehealth: Payer: Self-pay | Admitting: Family Medicine

## 2014-06-05 ENCOUNTER — Ambulatory Visit (INDEPENDENT_AMBULATORY_CARE_PROVIDER_SITE_OTHER): Payer: Medicare Other | Admitting: Pharmacist

## 2014-06-05 NOTE — Progress Notes (Signed)
No charge for labs or visit - INR check through home monitoring system  

## 2014-06-08 LAB — POCT INR: INR: 2

## 2014-06-09 NOTE — Telephone Encounter (Signed)
Called [employer], they are resending form. Javonna aware.

## 2014-06-10 ENCOUNTER — Ambulatory Visit (INDEPENDENT_AMBULATORY_CARE_PROVIDER_SITE_OTHER): Payer: Medicare Other | Admitting: Pharmacist

## 2014-06-10 DIAGNOSIS — I482 Chronic atrial fibrillation: Secondary | ICD-10-CM

## 2014-06-12 ENCOUNTER — Ambulatory Visit: Payer: Self-pay

## 2014-06-12 ENCOUNTER — Ambulatory Visit: Payer: Medicare Other | Admitting: *Deleted

## 2014-06-19 DIAGNOSIS — I4891 Unspecified atrial fibrillation: Secondary | ICD-10-CM | POA: Diagnosis not present

## 2014-06-19 LAB — POCT INR: INR: 2.1

## 2014-06-21 ENCOUNTER — Other Ambulatory Visit: Payer: Self-pay | Admitting: Family Medicine

## 2014-06-22 ENCOUNTER — Ambulatory Visit (INDEPENDENT_AMBULATORY_CARE_PROVIDER_SITE_OTHER): Payer: Medicare Other | Admitting: Pharmacist

## 2014-06-22 NOTE — Progress Notes (Signed)
No charge for labs or visit - INR check through home monitoring system  

## 2014-06-23 ENCOUNTER — Other Ambulatory Visit (INDEPENDENT_AMBULATORY_CARE_PROVIDER_SITE_OTHER): Payer: Medicare Other

## 2014-06-23 DIAGNOSIS — R300 Dysuria: Secondary | ICD-10-CM | POA: Diagnosis not present

## 2014-06-23 LAB — POCT URINALYSIS DIPSTICK
BILIRUBIN UA: NEGATIVE
GLUCOSE UA: NEGATIVE
Ketones, UA: NEGATIVE
Nitrite, UA: NEGATIVE
Protein, UA: NEGATIVE
SPEC GRAV UA: 1.015
Urobilinogen, UA: NEGATIVE
pH, UA: 6

## 2014-06-23 LAB — POCT UA - MICROSCOPIC ONLY
Casts, Ur, LPF, POC: NEGATIVE
Crystals, Ur, HPF, POC: NEGATIVE
Mucus, UA: NEGATIVE
YEAST UA: NEGATIVE

## 2014-06-24 ENCOUNTER — Telehealth: Payer: Self-pay | Admitting: Family Medicine

## 2014-06-24 MED ORDER — DOXYCYCLINE HYCLATE 100 MG PO TABS: 100.0000 mg | ORAL_TABLET | Freq: Two times a day (BID) | ORAL | Status: DC

## 2014-06-24 NOTE — Telephone Encounter (Signed)
Contact by nurse at [phone] through Result Notes folder

## 2014-06-24 NOTE — Addendum Note (Signed)
Addended by: Margurite AuerbachOMPTON, KARLA G on: 06/24/2014 03:04 PM   Modules accepted: Orders

## 2014-06-25 ENCOUNTER — Telehealth: Payer: Self-pay | Admitting: Family Medicine

## 2014-06-25 LAB — URINE CULTURE

## 2014-06-26 ENCOUNTER — Other Ambulatory Visit: Payer: Self-pay | Admitting: Family Medicine

## 2014-06-26 ENCOUNTER — Ambulatory Visit: Payer: Medicare Other

## 2014-06-26 MED ORDER — CIPROFLOXACIN HCL 500 MG PO TABS: 500.0000 mg | ORAL_TABLET | Freq: Two times a day (BID) | ORAL | Status: DC

## 2014-06-26 NOTE — Telephone Encounter (Signed)
Pt to stop doxcycline and start cipro. Pt will need his INR checked since starting on an antibiotic

## 2014-06-26 NOTE — Telephone Encounter (Signed)
Stp, he has picked up the rx for cipro and started taking it this morning. Pt advised to have INR checked.

## 2014-07-02 LAB — POCT INR: INR: 2.3

## 2014-07-03 ENCOUNTER — Ambulatory Visit: Payer: Medicare Other

## 2014-07-06 ENCOUNTER — Ambulatory Visit (INDEPENDENT_AMBULATORY_CARE_PROVIDER_SITE_OTHER): Payer: Medicare Other | Admitting: Pharmacist

## 2014-07-06 NOTE — Progress Notes (Signed)
No charge for labs or visit - INR check through home monitoring system  

## 2014-07-22 ENCOUNTER — Other Ambulatory Visit: Payer: Self-pay | Admitting: Family Medicine

## 2014-07-22 ENCOUNTER — Other Ambulatory Visit: Payer: Self-pay | Admitting: Family

## 2014-07-22 ENCOUNTER — Other Ambulatory Visit: Payer: Self-pay | Admitting: Nurse Practitioner

## 2014-07-24 ENCOUNTER — Ambulatory Visit (INDEPENDENT_AMBULATORY_CARE_PROVIDER_SITE_OTHER): Payer: Medicare Other | Admitting: Pharmacist

## 2014-07-24 LAB — POCT INR: INR: 1.8

## 2014-07-25 ENCOUNTER — Other Ambulatory Visit: Payer: Self-pay | Admitting: Family Medicine

## 2014-07-25 ENCOUNTER — Other Ambulatory Visit: Payer: Self-pay | Admitting: Nurse Practitioner

## 2014-07-25 ENCOUNTER — Other Ambulatory Visit: Payer: Self-pay | Admitting: Family

## 2014-07-30 LAB — POCT INR: INR: 2.1

## 2014-08-06 DIAGNOSIS — I4891 Unspecified atrial fibrillation: Secondary | ICD-10-CM | POA: Diagnosis not present

## 2014-08-06 LAB — POCT INR: INR: 2.4

## 2014-08-10 ENCOUNTER — Telehealth: Payer: Self-pay | Admitting: Pharmacist

## 2014-08-10 NOTE — Telephone Encounter (Signed)
Called to remind patient to do INR check at home - we have not received an INR since 07/24/14.  He states that his daughter has been doing them and asked me to contact her.  I tried to call Aloha GellJavonna - has to leave message.

## 2014-08-11 ENCOUNTER — Other Ambulatory Visit: Payer: Self-pay | Admitting: Family Medicine

## 2014-08-11 NOTE — Telephone Encounter (Signed)
Patient called and talked with him about two most recent INR results to let him know they are normal and to continue taking his warfarin the same way and re-check in 1 week.  No new medications or complications noted.

## 2014-08-20 LAB — POCT INR: INR: 2

## 2014-08-21 ENCOUNTER — Encounter: Payer: Self-pay | Admitting: Family Medicine

## 2014-08-27 LAB — POCT INR: INR: 2

## 2014-08-31 ENCOUNTER — Ambulatory Visit (INDEPENDENT_AMBULATORY_CARE_PROVIDER_SITE_OTHER): Payer: Medicare Other | Admitting: Pharmacist

## 2014-08-31 ENCOUNTER — Encounter: Payer: Self-pay | Admitting: Family Medicine

## 2014-09-14 ENCOUNTER — Ambulatory Visit: Payer: Medicare Other | Admitting: Family Medicine

## 2014-09-15 ENCOUNTER — Ambulatory Visit (INDEPENDENT_AMBULATORY_CARE_PROVIDER_SITE_OTHER): Payer: Medicare Other | Admitting: Family Medicine

## 2014-09-15 ENCOUNTER — Encounter: Payer: Self-pay | Admitting: Family Medicine

## 2014-09-15 VITALS — BP 111/79 | HR 66 | Temp 98.2°F

## 2014-09-15 DIAGNOSIS — D696 Thrombocytopenia, unspecified: Secondary | ICD-10-CM | POA: Diagnosis not present

## 2014-09-15 DIAGNOSIS — I69351 Hemiplegia and hemiparesis following cerebral infarction affecting right dominant side: Secondary | ICD-10-CM

## 2014-09-15 DIAGNOSIS — E038 Other specified hypothyroidism: Secondary | ICD-10-CM | POA: Diagnosis not present

## 2014-09-15 DIAGNOSIS — E034 Atrophy of thyroid (acquired): Secondary | ICD-10-CM

## 2014-09-15 DIAGNOSIS — E559 Vitamin D deficiency, unspecified: Secondary | ICD-10-CM

## 2014-09-15 DIAGNOSIS — E785 Hyperlipidemia, unspecified: Secondary | ICD-10-CM

## 2014-09-15 DIAGNOSIS — I10 Essential (primary) hypertension: Secondary | ICD-10-CM | POA: Diagnosis not present

## 2014-09-15 DIAGNOSIS — I480 Paroxysmal atrial fibrillation: Secondary | ICD-10-CM

## 2014-09-15 DIAGNOSIS — I69851 Hemiplegia and hemiparesis following other cerebrovascular disease affecting right dominant side: Secondary | ICD-10-CM

## 2014-09-15 LAB — POCT CBC
Granulocyte percent: 57.3 %G (ref 37–80)
HEMATOCRIT: 43.8 % (ref 43.5–53.7)
Hemoglobin: 13.8 g/dL — AB (ref 14.1–18.1)
LYMPH, POC: 1.4 (ref 0.6–3.4)
MCH: 30.4 pg (ref 27–31.2)
MCHC: 31.5 g/dL — AB (ref 31.8–35.4)
MCV: 96.7 fL (ref 80–97)
MPV: 8.3 fL (ref 0–99.8)
PLATELET COUNT, POC: 112 10*3/uL — AB (ref 142–424)
POC Granulocyte: 2.5 (ref 2–6.9)
POC LYMPH PERCENT: 32.4 %L (ref 10–50)
RBC: 4.53 M/uL — AB (ref 4.69–6.13)
RDW, POC: 15.9 %
WBC: 4.3 10*3/uL — AB (ref 4.6–10.2)

## 2014-09-15 LAB — POCT INR: INR: 1.7

## 2014-09-15 NOTE — Patient Instructions (Addendum)
Patient should continue to check his monthly pro times He should continue to be careful and do not put himself at risk for having an accident with his scooter for falling out of the scooter. He should exercise his extremities as much as possible He should check his pro times weekly We will call him with his lab work as soon as those results become available He should drink plenty of water and fluids Take one whole table of coumadin today only and return to normal schedule tomorrow

## 2014-09-15 NOTE — Progress Notes (Signed)
 Subjective:    Patient ID: Omar [name], male    DOB: 12/05/1945, 69 y.o.   MRN: [id number]  HPI  69 year old male comes in today for follow up on chronic medical conditions which include hypertension, asthma, hypothyroidism, A fib, Vit D deficiency, obesity, hyperlipidemia, and gout. The patient is doing extremely extremely well considering his morbid obesity is atrial fibrillation and his stroke causing right hemiplegia. Came to the office today with his motorized scooter. He denies chest pain or shortness of breath trouble swallowing blood in the stool or problems passing his water. He is able to negotiate around his home and use the toilet with sliding his body 2 different locations. He has a positive attitude. He is alert and communicative. He just has right-sided weakness.  Patient Active Problem List   Diagnosis Date Noted  . Thrombocytopenia 05/04/2014  . 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 Hemiparesis affecting right side as late effect of cerebrovascular accident 09/11/2013  . Hypothyroidism 09/11/2013  . Cerebrovascular accident with involvement of right side of body 04/17/2013  . Allergic rhinitis 06/06/2012  . Gout 06/06/2012  . Osteoarthrosis and allied disorders   . Hyperlipidemia   . Vitamin D deficiency   . Hypertension   . Asthma   . FIBRILLATION, ATRIAL 03/10/2009  . OBESITY, MORBID 03/02/2009   Outpatient Encounter Prescriptions as of 09/15/2014  Medication Sig  . albuterol (PROVENTIL HFA;VENTOLIN HFA) 108 (90 BASE) MCG/ACT inhaler Inhale 2 puffs into the lungs every 6 (six) hours as needed for wheezing or shortness of breath.  . allopurinol (ZYLOPRIM) 300 MG tablet TAKE 1 TABLET DAILY  . amLODipine (NORVASC) 5 MG tablet TAKE 1 AND 1/2 TABLETS DAILY  . atorvastatin (LIPITOR) 20 MG tablet TAKE 1 TABLET (20 MG TOTAL) BY MOUTH DAILY AT 6 PM.  . gabapentin (NEURONTIN) 300 MG capsule TAKE 1 CAPSULE AT 8 AM 1 AT 6 PM AND 2 AT BEDTIME  . KLOR-CON M20 20  MEQ tablet TAKE 1 TABLET BY MOUTH ONCE A DAY WITH MEAL  . levothyroxine (SYNTHROID, LEVOTHROID) 125 MCG tablet TAKE 1 TABLET EVERY DAY  . losartan (COZAAR) 100 MG tablet TAKE 1 TABLET EVERY DAY  . metoprolol (LOPRESSOR) 50 MG tablet TAKE 1/2 TABLET 2 TIMES A DAY  . warfarin (COUMADIN) 7.5 MG tablet TAKE 1 TABLET DAILY AT 6PM  . [DISCONTINUED] amoxicillin (AMOXIL) 500 MG capsule TAKE 1 CAPSULE (500 MG TOTAL) BY MOUTH 3 (THREE) TIMES DAILY.  . [DISCONTINUED] atorvastatin (LIPITOR) 20 MG tablet TAKE 1 TABLET (20 MG TOTAL) BY MOUTH DAILY AT 6 PM.  . [DISCONTINUED] ciprofloxacin (CIPRO) 500 MG tablet Take 1 tablet (500 mg total) by mouth 2 (two) times daily.   No facility-administered encounter medications on file as of 09/15/2014.       Review of Systems  Constitutional: Negative.   HENT: Negative.   Eyes: Negative.   Respiratory: Negative.   Cardiovascular: Negative.   Gastrointestinal: Negative.   Endocrine: Negative.   Genitourinary: Negative.   Musculoskeletal: Negative.   Skin: Negative.   Allergic/Immunologic: Negative.   Neurological: Negative.   Hematological: Negative.   Psychiatric/Behavioral: Negative.        Objective:   Physical Exam  Constitutional: He is oriented to person, place, and time. He appears well-developed and well-nourished. No distress.  HENT:  Head: Normocephalic and atraumatic.  Right Ear: External ear normal.  Left Ear: External ear normal.  Nose: Nose normal.  Mouth/Throat: Oropharynx is clear and moist. No oropharyngeal   exudate.  Eyes: Conjunctivae and EOM are normal. Pupils are equal, round, and reactive to light. Right eye exhibits no discharge. Left eye exhibits no discharge. No scleral icterus.  Neck: Normal range of motion. Neck supple. No thyromegaly present.  Cardiovascular: Normal rate and normal heart sounds.   No murmur heard. The heart is irregular irregular at 72/m  Pulmonary/Chest: Effort normal and breath sounds normal. No  respiratory distress. He has no wheezes. He has no rales. He exhibits no tenderness.  Clear anteriorly and posteriorly without wheezing or rales  Abdominal: Soft. Bowel sounds are normal. He exhibits no mass. There is no tenderness. There is no rebound and no guarding.  Morbid obesity without masses or tenderness  Musculoskeletal: He exhibits edema. He exhibits no tenderness.  Patient has bilateral edema due to his confinement and inability to walk  Lymphadenopathy:    He has no cervical adenopathy.  Neurological: He is alert and oriented to person, place, and time.  He has inability to move the right upper extremity on the right lower extremity. He is alert.  Skin: Skin is warm and dry. No rash noted. No erythema. No pallor.  Psychiatric: He has a normal mood and affect. His behavior is normal. Judgment and thought content normal.  The patient was alert and his mood was positive  Nursing note and vitals reviewed.   BP 111/79 mmHg  Pulse 66  Temp(Src) 98.2 F (36.8 C) (Oral)  Wt        Assessment & Plan:  1. Paroxysmal atrial fibrillation -The patient was in atrial fibrillation today at a rate of 72/m and he seems to be tolerating this well.  2. 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  3. Morbid obesity -His morbid obesity has been an ongoing problem for many years.  4. Essential hypertension -The blood pressure is good today he should continue with current treatment - BMP8+EGFR  5. Vitamin D deficiency -He will have a vitamin D level done today and treatment will be determined based on these results - Vit D  25 hydroxy (rtn osteoporosis monitoring)  6. Hyperlipemia -His past cholesterol numbers have been good and he will continue current treatment pending results of lab work being done today. - Lipid  panel - Hepatic function panel  7. Thrombocytopenia -He is having no bleeding issues. He is also on warfarin. - POCT CBC  8. Hypothyroidism due to acquired atrophy of thyroid -He will continue current thyroid replacement pending results of lab work - Thyroid Panel With TSH  9. Hyperlipidemia -He will continue current cholesterol treatment pending results of lab work  No orders of the defined types were placed in this encounter.   Patient Instructions  Patient should continue to check his monthly pro times He should continue to be careful and do not put himself at risk for having an accident with his scooter for falling out of the scooter. He should exercise his extremities as much as possible He should check his pro times weekly We will call him with his lab work as soon as those results become available He should drink plenty of water and fluids   Arrie Senate MD

## 2014-09-17 LAB — LIPID PANEL
Chol/HDL Ratio: 2.5 ratio units (ref 0.0–5.0)
Cholesterol, Total: 85 mg/dL — ABNORMAL LOW (ref 100–199)
HDL: 34 mg/dL — ABNORMAL LOW (ref >39)
LDL Calculated: 41 mg/dL (ref 0–99)
Triglycerides: 49 mg/dL (ref 0–149)
VLDL Cholesterol Cal: 10 mg/dL (ref 5–40)

## 2014-09-17 LAB — BMP8+EGFR
BUN/Creatinine Ratio: 16 (ref 10–22)
BUN: 14 mg/dL (ref 8–27)
CALCIUM: 9.1 mg/dL (ref 8.6–10.2)
CHLORIDE: 104 mmol/L (ref 97–108)
CO2: 24 mmol/L (ref 18–29)
Creatinine, Ser: 0.88 mg/dL (ref 0.76–1.27)
GFR calc Af Amer: 101 mL/min/{1.73_m2} (ref >59)
GFR calc non Af Amer: 87 mL/min/{1.73_m2} (ref >59)
Glucose: 82 mg/dL (ref 65–99)
Potassium: 4.1 mmol/L (ref 3.5–5.2)
SODIUM: 143 mmol/L (ref 134–144)

## 2014-09-17 LAB — HEPATIC FUNCTION PANEL
ALBUMIN: 3.8 g/dL (ref 3.5–4.8)
ALT: 9 IU/L (ref 0–44)
AST: 14 IU/L (ref 0–40)
Alkaline Phosphatase: 80 IU/L (ref 39–117)
BILIRUBIN, DIRECT: 0.24 mg/dL (ref 0.00–0.40)
Bilirubin Total: 0.6 mg/dL (ref 0.0–1.2)
TOTAL PROTEIN: 9.4 g/dL — AB (ref 6.0–8.5)

## 2014-09-17 LAB — VITAMIN D 25 HYDROXY (VIT D DEFICIENCY, FRACTURES): VIT D 25 HYDROXY: 29.5 ng/mL — AB (ref 30.0–100.0)

## 2014-09-17 LAB — THYROID PANEL WITH TSH
Free Thyroxine Index: 3.3 (ref 1.2–4.9)
T3 Uptake Ratio: 31 % (ref 24–39)
T4 TOTAL: 10.5 ug/dL (ref 4.5–12.0)
TSH: 0.695 u[IU]/mL (ref 0.450–4.500)

## 2014-10-02 DIAGNOSIS — I4891 Unspecified atrial fibrillation: Secondary | ICD-10-CM | POA: Diagnosis not present

## 2014-10-02 LAB — POCT INR: INR: 2.2

## 2014-10-05 ENCOUNTER — Telehealth: Payer: Self-pay

## 2014-10-05 NOTE — Telephone Encounter (Signed)
They supply patient with in home INR testing and have not received a result for a month now and can not reach patient   Wondering if you know whats going on???

## 2014-10-05 NOTE — Telephone Encounter (Signed)
Patient has INR checked 09/20/14 when he was here to see Dr Christell Constant.  I tried to call but home nmber was busy.  Left message on cell VM of his daughter who is usually the one who checks INR for him.

## 2014-10-06 ENCOUNTER — Other Ambulatory Visit (INDEPENDENT_AMBULATORY_CARE_PROVIDER_SITE_OTHER): Payer: Medicare Other

## 2014-10-06 ENCOUNTER — Ambulatory Visit (INDEPENDENT_AMBULATORY_CARE_PROVIDER_SITE_OTHER): Payer: Medicare Other | Admitting: Pharmacist

## 2014-10-06 DIAGNOSIS — R300 Dysuria: Secondary | ICD-10-CM

## 2014-10-06 LAB — POCT URINALYSIS DIPSTICK
Glucose, UA: NEGATIVE
KETONES UA: NEGATIVE
NITRITE UA: NEGATIVE
Protein, UA: NEGATIVE
Spec Grav, UA: 1.015
UROBILINOGEN UA: 2
pH, UA: 6

## 2014-10-06 LAB — POCT UA - MICROSCOPIC ONLY
Bacteria, U Microscopic: NEGATIVE
CRYSTALS, UR, HPF, POC: NEGATIVE
Casts, Ur, LPF, POC: NEGATIVE
Yeast, UA: NEGATIVE

## 2014-10-06 NOTE — Progress Notes (Signed)
Lab only 

## 2014-10-08 LAB — URINE CULTURE

## 2014-10-10 ENCOUNTER — Other Ambulatory Visit: Payer: Self-pay | Admitting: Family Medicine

## 2014-10-12 ENCOUNTER — Ambulatory Visit (INDEPENDENT_AMBULATORY_CARE_PROVIDER_SITE_OTHER): Payer: Medicare Other | Admitting: Pharmacist

## 2014-10-12 DIAGNOSIS — I4891 Unspecified atrial fibrillation: Secondary | ICD-10-CM | POA: Diagnosis not present

## 2014-10-12 LAB — POCT INR: INR: 1.6

## 2014-10-12 NOTE — Progress Notes (Signed)
Patient checks INR at home with Home INR monitoring.  Billing once per month interupertation fee.  Patient diagnosis - I48.91 - atrial fibrillation Procedure code if [id number]

## 2014-10-13 ENCOUNTER — Telehealth: Payer: Self-pay | Admitting: *Deleted

## 2014-10-13 NOTE — Telephone Encounter (Signed)
inr addressed yesterday by TBE

## 2014-10-28 ENCOUNTER — Encounter: Payer: Self-pay | Admitting: Family Medicine

## 2014-10-28 ENCOUNTER — Ambulatory Visit (INDEPENDENT_AMBULATORY_CARE_PROVIDER_SITE_OTHER): Payer: Medicare Other | Admitting: Family Medicine

## 2014-10-28 VITALS — BP 147/108 | HR 50 | Temp 97.7°F

## 2014-10-28 DIAGNOSIS — R062 Wheezing: Secondary | ICD-10-CM

## 2014-10-28 DIAGNOSIS — J209 Acute bronchitis, unspecified: Secondary | ICD-10-CM | POA: Diagnosis not present

## 2014-10-28 DIAGNOSIS — I10 Essential (primary) hypertension: Secondary | ICD-10-CM

## 2014-10-28 DIAGNOSIS — I69851 Hemiplegia and hemiparesis following other cerebrovascular disease affecting right dominant side: Secondary | ICD-10-CM

## 2014-10-28 DIAGNOSIS — I69351 Hemiplegia and hemiparesis following cerebral infarction affecting right dominant side: Secondary | ICD-10-CM

## 2014-10-28 MED ORDER — ALBUTEROL SULFATE HFA 108 (90 BASE) MCG/ACT IN AERS: 2.0000 | INHALATION_SPRAY | Freq: Four times a day (QID) | RESPIRATORY_TRACT | Status: DC | PRN

## 2014-10-28 MED ORDER — CEFDINIR 300 MG PO CAPS: 300.0000 mg | ORAL_CAPSULE | Freq: Two times a day (BID) | ORAL | Status: DC

## 2014-10-28 NOTE — Patient Instructions (Signed)
Take Mucinex, blue and white in color, 1 twice daily for cough and congestion with a large glass of water Use nasal saline for nasal congestion frequently through the day Take antibiotic as directed Get started back on blood pressure medicine this is very important

## 2014-10-28 NOTE — Progress Notes (Signed)
Subjective:    Patient ID: Omar [name], male    DOB: [birth year], 69 y.o.   MRN: [id number]  HPI Patient here today for cough and congestion that started on Sunday. He is accompanied today by his grandson. He has yellow green sputum from this. It is of note that he has not taken his blood pressure medicine and a couple of days and normally his blood pressure is good. He denies any fever. He plans to start back on his blood pressure medication. The drainage has been from his head as well as coughing up sputum from his chest. This patient has a history of atrial fibrillation and stroke and paralysis and uses a motorized wheelchair for ambulation. He is alert.     Patient Active Problem List   Diagnosis Date Noted  . Thrombocytopenia 05/04/2014  . 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 Hemiparesis affecting right side as late effect of cerebrovascular accident 09/11/2013  . Hypothyroidism 09/11/2013  . Cerebrovascular accident with involvement of right side of body 04/17/2013  . Allergic rhinitis 06/06/2012  . Gout 06/06/2012  . Osteoarthrosis and allied disorders   . Hyperlipidemia   . Vitamin D deficiency   . Hypertension   . Asthma   . FIBRILLATION, ATRIAL 03/10/2009  . OBESITY, MORBID 03/02/2009   Outpatient Encounter Prescriptions as of 10/28/2014  Medication Sig  . albuterol (PROVENTIL HFA;VENTOLIN HFA) 108 (90 BASE) MCG/ACT inhaler Inhale 2 puffs into the lungs every 6 (six) hours as needed for wheezing or shortness of breath.  . allopurinol (ZYLOPRIM) 300 MG tablet TAKE 1 TABLET DAILY  . amLODipine (NORVASC) 5 MG tablet TAKE 1 AND 1/2 TABLETS DAILY  . atorvastatin (LIPITOR) 20 MG tablet TAKE 1 TABLET (20 MG TOTAL) BY MOUTH DAILY AT 6 PM.  . gabapentin (NEURONTIN) 300 MG capsule TAKE 1 CAPSULE AT 8 AM 1 AT 6 PM AND 2 AT BEDTIME  . KLOR-CON M20 20 MEQ tablet TAKE 1 TABLET BY MOUTH ONCE A DAY WITH MEAL  . levothyroxine (SYNTHROID, LEVOTHROID) 125 MCG tablet TAKE 1  TABLET EVERY DAY  . losartan (COZAAR) 100 MG tablet TAKE 1 TABLET EVERY DAY  . metoprolol (LOPRESSOR) 50 MG tablet TAKE 1/2 TABLET 2 TIMES A DAY  . warfarin (COUMADIN) 7.5 MG tablet TAKE 1 TABLET DAILY AT 6PM  . [DISCONTINUED] warfarin (COUMADIN) 7.5 MG tablet TAKE 1 TABLET DAILY AT 6PM   No facility-administered encounter medications on file as of 10/28/2014.      Review of Systems  Constitutional: Negative.   HENT: Positive for congestion (yellow/green).   Eyes: Negative.   Respiratory: Positive for cough (started Sunday).   Cardiovascular: Negative.   Gastrointestinal: Negative.   Endocrine: Negative.   Genitourinary: Negative.   Musculoskeletal: Negative.   Skin: Negative.   Allergic/Immunologic: Negative.   Neurological: Negative.   Hematological: Negative.   Psychiatric/Behavioral: Negative.        Objective:   Physical Exam  Constitutional: He is oriented to person, place, and time. He appears well-developed and well-nourished. No distress.  HENT:  Head: Normocephalic and atraumatic.  Right Ear: External ear normal.  Left Ear: External ear normal.  Mouth/Throat: Oropharynx is clear and moist. No oropharyngeal exudate.  Some nasal congestion but throat is clear  Eyes: Conjunctivae and EOM are normal. Pupils are equal, round, and reactive to light. Right eye exhibits no discharge. Left eye exhibits no discharge. No scleral icterus.  Neck: Normal range of motion. Neck supple. No thyromegaly present.  No  anterior cervical adenopathy  Cardiovascular: Normal rate and normal heart sounds.   No murmur heard. The heart is irregular irregular at 60-72/m  Pulmonary/Chest: Effort normal and breath sounds normal. No respiratory distress. He has no wheezes. He has no rales. He exhibits no tenderness.  Tight cough without rales  Abdominal: Soft. Bowel sounds are normal. He exhibits no mass.  Morbid obesity  Musculoskeletal: He exhibits edema. He exhibits no tenderness.  The  patient has a history of stroke and is not able to ambulate.  Lymphadenopathy:    He has no cervical adenopathy.  Neurological: He is alert and oriented to person, place, and time.  The patient has lower extremity paralysis secondary to CVA  Skin: Skin is warm and dry. No rash noted.  Psychiatric: He has a normal mood and affect. His behavior is normal. Judgment and thought content normal.  He is alert and responsive to questions asked of him  Nursing note and vitals reviewed.  BP 147/108 mmHg  Pulse 50  Temp(Src) 97.7 F (36.5 C) (Oral)  Ht   Wt         Assessment & Plan:  1. Acute bronchitis, unspecified organism -Drink plenty of fluids and take Mucinex as directed along with using nasal saline and Claritin - cefdinir (OMNICEF) 300 MG capsule; Take 1 capsule (300 mg total) by mouth 2 (two) times daily.  Dispense: 20 capsule; Refill: 0  2. Wheezing -Take Mucinex and use inhaler as needed for wheezing - albuterol (PROVENTIL HFA;VENTOLIN HFA) 108 (90 BASE) MCG/ACT inhaler; Inhale 2 puffs into the lungs every 6 (six) hours as needed for wheezing or shortness of breath.  Dispense: 1 Inhaler; Refill: 1  3. Hypertension -Patient must take his blood pressure medicine regularly  4. Hemiparesis with right-sided lower extremity paralysis  Patient Instructions  Take Mucinex, blue and white in color, 1 twice daily for cough and congestion with a large glass of water Use nasal saline for nasal congestion frequently through the day Take antibiotic as directed Get started back on blood pressure medicine this is very important   Nyra Capes MD

## 2014-10-29 LAB — POCT INR: INR: 2.2

## 2014-11-04 ENCOUNTER — Ambulatory Visit (INDEPENDENT_AMBULATORY_CARE_PROVIDER_SITE_OTHER): Payer: Medicare Other | Admitting: Pharmacist

## 2014-11-04 NOTE — Progress Notes (Signed)
No charge for labs or visit - INR check through home monitoring system  

## 2014-11-05 DIAGNOSIS — I4891 Unspecified atrial fibrillation: Secondary | ICD-10-CM | POA: Diagnosis not present

## 2014-11-05 LAB — POCT INR: INR: 2.2

## 2014-11-11 ENCOUNTER — Other Ambulatory Visit: Payer: Self-pay | Admitting: Family Medicine

## 2014-11-12 ENCOUNTER — Other Ambulatory Visit: Payer: Self-pay | Admitting: Nurse Practitioner

## 2014-11-12 LAB — POCT INR: INR: 2.3

## 2014-11-16 ENCOUNTER — Ambulatory Visit (INDEPENDENT_AMBULATORY_CARE_PROVIDER_SITE_OTHER): Payer: Medicare Other | Admitting: Pharmacist

## 2014-11-17 ENCOUNTER — Ambulatory Visit (INDEPENDENT_AMBULATORY_CARE_PROVIDER_SITE_OTHER): Payer: Medicare Other | Admitting: Family Medicine

## 2014-11-17 ENCOUNTER — Ambulatory Visit (INDEPENDENT_AMBULATORY_CARE_PROVIDER_SITE_OTHER): Payer: Medicare Other

## 2014-11-17 ENCOUNTER — Encounter: Payer: Self-pay | Admitting: Family Medicine

## 2014-11-17 VITALS — BP 124/84 | HR 64 | Temp 97.8°F | Ht 67.0 in

## 2014-11-17 DIAGNOSIS — D696 Thrombocytopenia, unspecified: Secondary | ICD-10-CM

## 2014-11-17 DIAGNOSIS — R05 Cough: Secondary | ICD-10-CM

## 2014-11-17 DIAGNOSIS — R059 Cough, unspecified: Secondary | ICD-10-CM

## 2014-11-17 DIAGNOSIS — J40 Bronchitis, not specified as acute or chronic: Secondary | ICD-10-CM

## 2014-11-17 DIAGNOSIS — I480 Paroxysmal atrial fibrillation: Secondary | ICD-10-CM

## 2014-11-17 DIAGNOSIS — J209 Acute bronchitis, unspecified: Secondary | ICD-10-CM

## 2014-11-17 DIAGNOSIS — I10 Essential (primary) hypertension: Secondary | ICD-10-CM | POA: Diagnosis not present

## 2014-11-17 LAB — POCT INR: INR: 2.1

## 2014-11-17 NOTE — Progress Notes (Signed)
Subjective:    Patient ID: Omar [name], male    DOB: 1945-04-17, 69 y.o.   MRN: [id number]  HPI Patient here today for continued cough and congestion and he also wants anticoagulation check up. On the previous visit he was given a round of Omnicef. He was also given Mucinex and was told to use his albuterol inhaler more regularly. At the last visit he had not been taking his blood pressure medicine regularly. He has gone back on this and initial blood pressures today are improved but still elevated diastolically. The patient still has some cough and congestion and the sputum is mostly gray in color. He is not been running any fever. He is taking his blood pressure medicines regularly.      Patient Active Problem List   Diagnosis Date Noted  . Thrombocytopenia 05/04/2014  . 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 Hemiparesis affecting right side as late effect of cerebrovascular accident 09/11/2013  . Hypothyroidism 09/11/2013  . Cerebrovascular accident with involvement of right side of body 04/17/2013  . Allergic rhinitis 06/06/2012  . Gout 06/06/2012  . Osteoarthrosis and allied disorders   . Hyperlipidemia   . Vitamin D deficiency   . Hypertension   . Asthma   . FIBRILLATION, ATRIAL 03/10/2009  . OBESITY, MORBID 03/02/2009   Outpatient Encounter Prescriptions as of 11/17/2014  Medication Sig  . albuterol (PROVENTIL HFA;VENTOLIN HFA) 108 (90 BASE) MCG/ACT inhaler Inhale 2 puffs into the lungs every 6 (six) hours as needed for wheezing or shortness of breath.  . allopurinol (ZYLOPRIM) 300 MG tablet TAKE 1 TABLET DAILY  . amLODipine (NORVASC) 5 MG tablet TAKE 1 AND 1/2 TABLETS DAILY  . atorvastatin (LIPITOR) 20 MG tablet TAKE 1 TABLET (20 MG TOTAL) BY MOUTH DAILY AT 6 PM.  . gabapentin (NEURONTIN) 300 MG capsule TAKE 1 CAPSULE AT 8 AM 1 AT 6 PM AND 2 AT BEDTIME  . levothyroxine (SYNTHROID, LEVOTHROID) 125 MCG tablet TAKE 1 TABLET EVERY DAY  . losartan (COZAAR) 100 MG  tablet TAKE 1 TABLET EVERY DAY  . metoprolol (LOPRESSOR) 50 MG tablet TAKE 1/2 TABLET 2 TIMES A DAY  . potassium chloride SA (KLOR-CON M20) 20 MEQ tablet Take one po qd  . warfarin (COUMADIN) 7.5 MG tablet TAKE 1 TABLET DAILY AT 6PM  . [DISCONTINUED] cefdinir (OMNICEF) 300 MG capsule Take 1 capsule (300 mg total) by mouth 2 (two) times daily.   No facility-administered encounter medications on file as of 11/17/2014.      Review of Systems  Constitutional: Negative.   HENT: Positive for congestion.   Eyes: Negative.   Respiratory: Positive for cough.   Cardiovascular: Negative.   Gastrointestinal: Negative.   Endocrine: Negative.   Genitourinary: Negative.   Musculoskeletal: Negative.   Skin: Negative.   Allergic/Immunologic: Negative.   Neurological: Negative.   Hematological: Negative.   Psychiatric/Behavioral: Negative.        Objective:   Physical Exam  Constitutional: He is oriented to person, place, and time. He appears well-developed and well-nourished. No distress.  The patient comes to the visit today in his motorized wheelchair. He is alert and answers questions appropriately.  HENT:  Head: Normocephalic and atraumatic.  Right Ear: External ear normal.  Left Ear: External ear normal.  Mouth/Throat: No oropharyngeal exudate.  There is nasal congestion bilaterally  Eyes: Conjunctivae and EOM are normal. Pupils are equal, round, and reactive to light. Right eye exhibits no discharge. Left eye exhibits no discharge.  Neck:  Normal range of motion. Neck supple. No thyromegaly present.  Cardiovascular: Normal rate and normal heart sounds.   No murmur heard. Irregular irregular at 72/m  Pulmonary/Chest: Effort normal. No respiratory distress. He has no wheezes. He has no rales.  The patient has 8 height congested cough.  Musculoskeletal:  Due to his paralysis he comes to the visit in his motorized wheelchair.  Lymphadenopathy:    He has no cervical adenopathy.    Neurological: He is alert and oriented to person, place, and time.  Skin: Skin is warm and dry. No rash noted.  Psychiatric: He has a normal mood and affect. His behavior is normal. Thought content normal.  Nursing note and vitals reviewed.  BP 133/90 mmHg  Pulse 64  Temp(Src) 97.8 F (36.6 C) (Oral)  Ht  (1.702 m)  Wt   [facility] reading (PRIMARY) by  Dr. Christell Constant- chest x-ray- no acute findings noted except heart enlargement                                        Assessment & Plan:  1. Cough -Continue to take Mucinex and drink plenty of fluids - CBC with Differential/Platelet - DG Chest 2 View; Future  2. Paroxysmal atrial fibrillation -The INR today was 2.1 and he should continue with current treatment - POCT INR  3. Bronchitis with bronchospasm -Drink plenty of fluids and use Mucinex regularly. Also use albuterol inhaler as needed. Use the new inhaler, breo 1 puff once daily and rinse mouth after using  4. Thrombocytopenia -No signs of any bleeding issues.  5. Essential hypertension -Blood pressure is improved today since patient is back on his medications. A repeat blood pressure by me was 124/84 in the left arm sitting  Patient Instructions   Continue using albuterol inhaler every 6 hours as needed  Use the new inhaler 1 puff once daily and rinse mouth after using  Continue taking the Mucinex twice daily with a large glass of water  Watch sodium intake and monitor blood pressures at home if possible  Your INR today was 2.1 and this is within normal limits and you should continue with your current warfarin as you are currently doing. The patient should call in a couple weeks with his progress from how he is today   Nyra Capes MD

## 2014-11-17 NOTE — Patient Instructions (Addendum)
Continue using albuterol inhaler every 6 hours as needed  Use the new inhaler 1 puff once daily and rinse mouth after using  Continue taking the Mucinex twice daily with a large glass of water  Watch sodium intake and monitor blood pressures at home if possible  Your INR today was 2.1 and this is within normal limits and you should continue with your current warfarin as you are currently doing. The patient should call in a couple weeks with his progress from how he is today

## 2014-11-18 LAB — CBC WITH DIFFERENTIAL/PLATELET
Basophils Absolute: 0 10*3/uL (ref 0.0–0.2)
Basos: 0 %
EOS (ABSOLUTE): 0.3 10*3/uL (ref 0.0–0.4)
EOS: 5 %
HEMATOCRIT: 43.1 % (ref 37.5–51.0)
HEMOGLOBIN: 14 g/dL (ref 12.6–17.7)
IMMATURE GRANS (ABS): 0 10*3/uL (ref 0.0–0.1)
IMMATURE GRANULOCYTES: 0 %
Lymphocytes Absolute: 1.6 10*3/uL (ref 0.7–3.1)
Lymphs: 30 %
MCH: 32.1 pg (ref 26.6–33.0)
MCHC: 32.5 g/dL (ref 31.5–35.7)
MCV: 99 fL — ABNORMAL HIGH (ref 79–97)
MONOCYTES: 12 %
Monocytes Absolute: 0.6 10*3/uL (ref 0.1–0.9)
NEUTROS PCT: 53 %
Neutrophils Absolute: 2.8 10*3/uL (ref 1.4–7.0)
Platelets: 146 10*3/uL — ABNORMAL LOW (ref 150–379)
RBC: 4.36 x10E6/uL (ref 4.14–5.80)
RDW: 14.7 % (ref 12.3–15.4)
WBC: 5.3 10*3/uL (ref 3.4–10.8)

## 2014-11-23 ENCOUNTER — Ambulatory Visit (INDEPENDENT_AMBULATORY_CARE_PROVIDER_SITE_OTHER): Payer: Medicare Other | Admitting: Pharmacist

## 2014-11-25 ENCOUNTER — Other Ambulatory Visit: Payer: Self-pay | Admitting: Family Medicine

## 2014-11-26 LAB — POCT INR: INR: 2.2

## 2014-12-02 ENCOUNTER — Ambulatory Visit (INDEPENDENT_AMBULATORY_CARE_PROVIDER_SITE_OTHER): Payer: Medicare Other | Admitting: Pharmacist

## 2014-12-03 DIAGNOSIS — I4891 Unspecified atrial fibrillation: Secondary | ICD-10-CM | POA: Diagnosis not present

## 2014-12-03 LAB — PROTIME-INR

## 2014-12-16 ENCOUNTER — Telehealth: Payer: Self-pay | Admitting: Family Medicine

## 2014-12-16 NOTE — Telephone Encounter (Signed)
Patient aware that a flu shot is recommended and appointment given for Friday for flu clinic

## 2014-12-17 LAB — POCT INR: INR: 2.2

## 2014-12-18 ENCOUNTER — Ambulatory Visit: Payer: Medicare Other

## 2014-12-18 ENCOUNTER — Ambulatory Visit (INDEPENDENT_AMBULATORY_CARE_PROVIDER_SITE_OTHER): Payer: Medicare Other | Admitting: Pharmacist

## 2014-12-18 NOTE — Progress Notes (Signed)
No charge for labs or visit - INR check through home monitoring system  

## 2014-12-23 ENCOUNTER — Ambulatory Visit (INDEPENDENT_AMBULATORY_CARE_PROVIDER_SITE_OTHER): Payer: Medicare Other | Admitting: Pharmacist

## 2014-12-23 LAB — POCT INR: INR: 2.1

## 2014-12-23 NOTE — Progress Notes (Signed)
No charge for labs or visit - INR check through home monitoring system  

## 2014-12-25 ENCOUNTER — Ambulatory Visit: Payer: Medicare Other

## 2014-12-25 LAB — POCT INR: INR: 2.3

## 2014-12-28 ENCOUNTER — Encounter: Payer: Self-pay | Admitting: Family Medicine

## 2014-12-28 ENCOUNTER — Ambulatory Visit (INDEPENDENT_AMBULATORY_CARE_PROVIDER_SITE_OTHER): Payer: Medicare Other | Admitting: Pharmacist

## 2014-12-28 NOTE — Progress Notes (Signed)
No charge for labs or visit - INR check through home monitoring system  

## 2015-01-05 ENCOUNTER — Ambulatory Visit: Payer: Medicare Other

## 2015-01-07 DIAGNOSIS — I4891 Unspecified atrial fibrillation: Secondary | ICD-10-CM | POA: Diagnosis not present

## 2015-01-07 LAB — POCT INR: INR: 2.4

## 2015-01-08 ENCOUNTER — Ambulatory Visit: Payer: Medicare Other

## 2015-01-11 ENCOUNTER — Ambulatory Visit: Payer: Medicare Other

## 2015-01-13 ENCOUNTER — Other Ambulatory Visit: Payer: Self-pay | Admitting: Family Medicine

## 2015-01-13 ENCOUNTER — Ambulatory Visit (INDEPENDENT_AMBULATORY_CARE_PROVIDER_SITE_OTHER): Payer: Medicare Other | Admitting: Pharmacist

## 2015-01-13 DIAGNOSIS — I4892 Unspecified atrial flutter: Secondary | ICD-10-CM

## 2015-01-13 NOTE — Progress Notes (Signed)
Patient checks INR at home with Home INR monitoring.  Billing once per month interupertation fee.  Patient diagnosis - atrial flutter Procedure code if G0250

## 2015-01-14 ENCOUNTER — Ambulatory Visit (INDEPENDENT_AMBULATORY_CARE_PROVIDER_SITE_OTHER): Payer: Medicare Other

## 2015-01-14 DIAGNOSIS — Z23 Encounter for immunization: Secondary | ICD-10-CM

## 2015-01-14 LAB — POCT INR: INR: 2.4

## 2015-01-15 ENCOUNTER — Ambulatory Visit (INDEPENDENT_AMBULATORY_CARE_PROVIDER_SITE_OTHER): Payer: Medicare Other | Admitting: Pharmacist

## 2015-01-15 NOTE — Progress Notes (Signed)
No charge for labs or visit - INR check through home monitoring system  

## 2015-01-21 LAB — POCT INR: INR: 2.3

## 2015-01-25 ENCOUNTER — Ambulatory Visit (INDEPENDENT_AMBULATORY_CARE_PROVIDER_SITE_OTHER): Payer: Medicare Other | Admitting: Pharmacist

## 2015-01-25 NOTE — Progress Notes (Signed)
No charge for labs or visit - INR check through home monitoring system  

## 2015-01-29 LAB — POCT INR: INR: 2.5

## 2015-02-03 ENCOUNTER — Ambulatory Visit (INDEPENDENT_AMBULATORY_CARE_PROVIDER_SITE_OTHER): Payer: Medicare Other | Admitting: Pharmacist

## 2015-02-04 DIAGNOSIS — I4891 Unspecified atrial fibrillation: Secondary | ICD-10-CM | POA: Diagnosis not present

## 2015-02-04 LAB — PROTIME-INR

## 2015-02-17 ENCOUNTER — Ambulatory Visit (INDEPENDENT_AMBULATORY_CARE_PROVIDER_SITE_OTHER): Payer: Medicare Other | Admitting: Family Medicine

## 2015-02-17 ENCOUNTER — Encounter: Payer: Self-pay | Admitting: Family Medicine

## 2015-02-17 VITALS — BP 106/76 | HR 57 | Temp 97.5°F | Ht 67.0 in

## 2015-02-17 DIAGNOSIS — E559 Vitamin D deficiency, unspecified: Secondary | ICD-10-CM | POA: Diagnosis not present

## 2015-02-17 DIAGNOSIS — I480 Paroxysmal atrial fibrillation: Secondary | ICD-10-CM | POA: Diagnosis not present

## 2015-02-17 DIAGNOSIS — E034 Atrophy of thyroid (acquired): Secondary | ICD-10-CM | POA: Diagnosis not present

## 2015-02-17 DIAGNOSIS — J302 Other seasonal allergic rhinitis: Secondary | ICD-10-CM

## 2015-02-17 DIAGNOSIS — G8311 Monoplegia of lower limb affecting right dominant side: Secondary | ICD-10-CM | POA: Diagnosis not present

## 2015-02-17 DIAGNOSIS — E038 Other specified hypothyroidism: Secondary | ICD-10-CM | POA: Diagnosis not present

## 2015-02-17 DIAGNOSIS — D696 Thrombocytopenia, unspecified: Secondary | ICD-10-CM

## 2015-02-17 DIAGNOSIS — E785 Hyperlipidemia, unspecified: Secondary | ICD-10-CM | POA: Diagnosis not present

## 2015-02-17 DIAGNOSIS — I10 Essential (primary) hypertension: Secondary | ICD-10-CM | POA: Diagnosis not present

## 2015-02-17 DIAGNOSIS — I639 Cerebral infarction, unspecified: Secondary | ICD-10-CM | POA: Diagnosis not present

## 2015-02-17 DIAGNOSIS — Z23 Encounter for immunization: Secondary | ICD-10-CM

## 2015-02-17 DIAGNOSIS — I69351 Hemiplegia and hemiparesis following cerebral infarction affecting right dominant side: Secondary | ICD-10-CM | POA: Diagnosis not present

## 2015-02-17 LAB — POCT INR
INR: 2.5
INR: 2.4

## 2015-02-17 NOTE — Patient Instructions (Addendum)
Medicare Annual Wellness Visit  [facility] and the medical providers at [facility] strive to bring you the best medical care.  In doing so we not only want to address your current medical conditions and concerns but also to detect new conditions early and prevent illness, disease and health-related problems.    Medicare offers a yearly Wellness Visit which allows our clinical staff to assess your need for preventative services including immunizations, lifestyle education, counseling to decrease risk of preventable diseases and screening for fall risk and other medical concerns.    This visit is provided free of charge (no copay) for all Medicare recipients. The clinical pharmacists at [facility] have begun to conduct these Wellness Visits which will also include a thorough review of all your medications.    As you primary medical provider recommend that you make an appointment for your Annual Wellness Visit if you have not done so already this year.  You may set up this appointment before you leave today or you may call back [phone] and schedule an appointment.  Please make sure when you call that you mention that you are scheduling your Annual Wellness Visit with the clinical pharmacist so that the appointment may be made for the proper length of time.     Continue current medications. Continue good therapeutic lifestyle changes which include good diet and exercise. Fall precautions discussed with patient. If an FOBT was given today- please return it to our front desk. If you are over 530 years old - you may need Prevnar 13 or the adult Pneumonia vaccine.  **Flu shots are available--- please call and schedule a FLU-CLINIC appointment**  After your visit with us today you will receive a survey in the mail or online from [employer] regarding your care with us. Please take a moment to fill this out. Your feedback is very  important to us as you can help us better understand your patient needs as well as improve your experience and satisfaction. WE CARE ABOUT YOU!!!   The patient will continue to do the best he can with watching his weight and watching his diet He will use nasal saline and each nostril 3 or 4 times daily He will continue to exercise his paralyzed extremity on the right as the movement and this is improved from what it has been in the past He will try to get an eye exam done as he has not had one done in a good while He should keep the house as cool as possible and stay well hydrated He should return the FOBT We will call him with the lab work results as soon as these results become available

## 2015-02-17 NOTE — Progress Notes (Signed)
Subjective:    Patient ID: Omar [name], male    DOB: [birth year], 69 y.o.   MRN: [id number]  HPI Pt here for follow up and management of chronic medical problems which includes hypothyroid, hypertension and hyperlipidemia. He is taking medications regularly. This patient comes to the visit today in his motorized wheelchair. He seems to be in good spirits and complains only of sinus pressure. He had a stroke several years ago and this is pretty much confined him to staying at home. We were making home visits but he is able to drive his motorized wheelchair to the office from his home which is only a short distance away. The patient denies chest pain or shortness of breath. He has no trouble swallowing and heartburn indigestion and nausea vomiting diarrhea or blood in the stool. He has not seen any blood in his urine. He does home pro time checks his says that these usually run between the 2 and 3 range. We will do this today as a double check to make sure that his machine is correct. He wears a depends and has assistance with getting to the bed with help at home. He is pleasant and in excellent spirits considering having to live with a stroke and being confined to his home is much as he is.      Patient Active Problem List   Diagnosis Date Noted  . Thrombocytopenia ([place]) 05/04/2014  . 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 Hemiparesis affecting right side as late effect of cerebrovascular accident ([place]) 09/11/2013  . Hypothyroidism 09/11/2013  . Cerebrovascular accident with involvement of right side of body ([place]) 04/17/2013  . Allergic rhinitis 06/06/2012  . Gout 06/06/2012  . Osteoarthrosis and allied disorders   . Hyperlipidemia   . Vitamin D deficiency   . Hypertension   . Asthma   . FIBRILLATION, ATRIAL 03/10/2009  . OBESITY, MORBID 03/02/2009   Outpatient Encounter Prescriptions as of 02/17/2015  Medication Sig  . albuterol (PROVENTIL HFA;VENTOLIN HFA) 108 (90  BASE) MCG/ACT inhaler Inhale 2 puffs into the lungs every 6 (six) hours as needed for wheezing or shortness of breath.  . allopurinol (ZYLOPRIM) 300 MG tablet TAKE 1 TABLET DAILY  . amLODipine (NORVASC) 5 MG tablet TAKE 1 AND 1/2 TABLETS DAILY  . atorvastatin (LIPITOR) 20 MG tablet TAKE 1 TABLET (20 MG TOTAL) BY MOUTH DAILY AT 6 PM.  . gabapentin (NEURONTIN) 300 MG capsule TAKE 1 CAPSULE AT 8 AM 1 AT 6 PM AND 2 AT BEDTIME  . levothyroxine (SYNTHROID, LEVOTHROID) 125 MCG tablet TAKE 1 TABLET EVERY DAY  . losartan (COZAAR) 100 MG tablet TAKE 1 TABLET EVERY DAY  . metoprolol (LOPRESSOR) 50 MG tablet TAKE 1/2 TABLET 2 TIMES A DAY  . potassium chloride SA (KLOR-CON M20) 20 MEQ tablet Take one po qd  . warfarin (COUMADIN) 7.5 MG tablet TAKE 1 TABLET DAILY AT 6PM  . [DISCONTINUED] warfarin (COUMADIN) 7.5 MG tablet TAKE 1 TABLET DAILY AT 6PM   No facility-administered encounter medications on file as of 02/17/2015.     Review of Systems  Constitutional: Negative.   HENT: Positive for sinus pressure.   Eyes: Negative.   Respiratory: Negative.   Cardiovascular: Negative.   Gastrointestinal: Negative.   Endocrine: Negative.   Genitourinary: Negative.   Musculoskeletal: Negative.   Skin: Negative.   Allergic/Immunologic: Negative.   Neurological: Negative.   Hematological: Negative.   Psychiatric/Behavioral: Negative.        Objective:  Physical Exam  Constitutional: He is oriented to person, place, and time. He appears well-developed and well-nourished.  HENT:  Head: Normocephalic and atraumatic.  Right Ear: External ear normal.  Left Ear: External ear normal.  Mouth/Throat: Oropharynx is clear and moist. No oropharyngeal exudate.  Nasal congestion left greater than right  Eyes: Conjunctivae and EOM are normal. Pupils are equal, round, and reactive to light. Right eye exhibits no discharge. Left eye exhibits no discharge. No scleral icterus.  Neck: Normal range of motion. Neck  supple. No thyromegaly present.  No bruits thyromegaly or anterior cervical adenopathy  Cardiovascular: Normal rate and normal heart sounds.   No murmur heard. The heart is irregular irregular at 60/m  Pulmonary/Chest: Effort normal and breath sounds normal. No respiratory distress. He has no wheezes. He has no rales. He exhibits no tenderness.  Lungs are clear anteriorly and posteriorly  Abdominal: Soft. Bowel sounds are normal. He exhibits no mass. There is no tenderness. There is no rebound and no guarding.  The patient is unable to stand and he is unable for me to give him a rectal exam. He is morbidly obese. There are no masses tenderness or palpable organomegaly present  Genitourinary:  Unable to do this exam because of the patient's inability to stand  Musculoskeletal: He exhibits edema. He exhibits no tenderness.  There is some pedal edema in the right foot and no edema in the left leg or foot or right leg.  Lymphadenopathy:    He has no cervical adenopathy.  Neurological: He is alert and oriented to person, place, and time.  -Examine this patient multiple times since he had his stroke. He does appear to have more movement in the right arm and more grip with the right hand than he has in the past he also has movement in the right lower extremity and the right foot. This is not significant but it is improved from the past.  Skin: Skin is warm and dry. No rash noted.  Psychiatric: He has a normal mood and affect. His behavior is normal. Judgment and thought content normal.  Nursing note and vitals reviewed.  BP 106/76 mmHg  Pulse 57  Temp(Src) 97.5 F (36.4 C) (Oral)  Ht _0  (1.702 m)  Wt         Assessment & Plan:  1. Thrombocytopenia ([place]) -The patient has no signs of any bleeding issues from the rectum or the bladder. - CBC with Differential/Platelet - POCT INR  2. Essential hypertension -His blood pressure is good today and he will continue with current treatment -  BMP8+EGFR - CBC with Differential/Platelet - Hepatic function panel  3. Vitamin D deficiency -We will check a vitamin D level and make sure that he does not need any additional treatment - CBC with Differential/Platelet - VITAMIN D 25 Hydroxy (Vit-D Deficiency, Fractures)  4. Hyperlipidemia -For now continue the atorvastatin pending results of lab work - CBC with Differential/Platelet - Lipid panel  5. Hypothyroidism due to acquired atrophy of thyroid -Continue thyroid medication pending results of lab work - CBC with Differential/Platelet - Thyroid Panel With TSH  6. Paroxysmal atrial fibrillation ([facility]) -The patient remains in atrial fibrillation rate controlled and he will continue taking his Coumadin. We will do a ProTime today. - CBC with Differential/Platelet - POCT INR  7. Paralysis of right lower extremity ([facility]) -There appears to be slightly more movement of the right upper and lower extremity and the patient will continue to work on this person  only at home.  8. Morbid obesity due to excess calories ([place]) -He continues to have morbid obesity and we'll make all efforts to watch his diet more closely  9. Hemiparesis affecting right side as late effect of cerebrovascular accident ([place]) -Continue to exercise as much as possible  10. Cerebrovascular accident with involvement of right side of body ([place]) -Continue to exercise right side of body as much as possible  11. Other seasonal allergic rhinitis -Use nasal saline and keep the house as cool as possible  Patient Instructions                       Medicare Annual Wellness Visit  [facility] and the medical providers at [facility] strive to bring you the best medical care.  In doing so we not only want to address your current medical conditions and concerns but also to detect new conditions early and prevent illness, disease and health-related problems.    Medicare offers a yearly Wellness Visit  which allows our clinical staff to assess your need for preventative services including immunizations, lifestyle education, counseling to decrease risk of preventable diseases and screening for fall risk and other medical concerns.    This visit is provided free of charge (no copay) for all Medicare recipients. The clinical pharmacists at [facility] have begun to conduct these Wellness Visits which will also include a thorough review of all your medications.    As you primary medical provider recommend that you make an appointment for your Annual Wellness Visit if you have not done so already this year.  You may set up this appointment before you leave today or you may call back ([phone]) and schedule an appointment.  Please make sure when you call that you mention that you are scheduling your Annual Wellness Visit with the clinical pharmacist so that the appointment may be made for the proper length of time.     Continue current medications. Continue good therapeutic lifestyle changes which include good diet and exercise. Fall precautions discussed with patient. If an FOBT was given today- please return it to our front desk. If you are over 30 years old - you may need Prevnar 37 or the adult Pneumonia vaccine.  **Flu shots are available--- please call and schedule a FLU-CLINIC appointment**  After your visit with [place] today you will receive a survey in the mail or online from [employer] regarding your care with [place]. Please take a moment to fill this out. Your feedback is very important to [place] as you can help [place] better understand your patient needs as well as improve your experience and satisfaction. WE CARE ABOUT YOU!!!   The patient will continue to do the best he can with watching his weight and watching his diet He will use nasal saline and each nostril 3 or 4 times daily He will continue to exercise his paralyzed extremity on the right as the movement and this is  improved from what it has been in the past He will try to get an eye exam done as he has not had one done in a good while He should keep the house as cool as possible and stay well hydrated He should return the FOBT We will call him with the lab work results as soon as these results become available   Arrie Senate MD

## 2015-02-17 NOTE — Addendum Note (Signed)
Addended by: Magdalene RiverBULLINS, Larra Crunkleton H on: 02/17/2015 03:59 PM   Modules accepted: Orders

## 2015-02-18 ENCOUNTER — Telehealth: Payer: Self-pay

## 2015-02-18 LAB — CBC WITH DIFFERENTIAL/PLATELET
BASOS ABS: 0 10*3/uL (ref 0.0–0.2)
Basos: 0 %
EOS (ABSOLUTE): 0.3 10*3/uL (ref 0.0–0.4)
Eos: 6 %
HEMOGLOBIN: 13.5 g/dL (ref 12.6–17.7)
Hematocrit: 41.1 % (ref 37.5–51.0)
IMMATURE GRANS (ABS): 0 10*3/uL (ref 0.0–0.1)
Immature Granulocytes: 0 %
LYMPHS: 32 %
Lymphocytes Absolute: 1.4 10*3/uL (ref 0.7–3.1)
MCH: 31.9 pg (ref 26.6–33.0)
MCHC: 32.8 g/dL (ref 31.5–35.7)
MCV: 97 fL (ref 79–97)
MONOCYTES: 18 %
Monocytes Absolute: 0.8 10*3/uL (ref 0.1–0.9)
NEUTROS PCT: 44 %
Neutrophils Absolute: 1.9 10*3/uL (ref 1.4–7.0)
PLATELETS: 125 10*3/uL — AB (ref 150–379)
RBC: 4.23 x10E6/uL (ref 4.14–5.80)
RDW: 14.6 % (ref 12.3–15.4)
WBC: 4.3 10*3/uL (ref 3.4–10.8)

## 2015-02-18 LAB — BMP8+EGFR
BUN/Creatinine Ratio: 19 (ref 10–22)
BUN: 16 mg/dL (ref 8–27)
CALCIUM: 9.2 mg/dL (ref 8.6–10.2)
CHLORIDE: 102 mmol/L (ref 96–106)
CO2: 30 mmol/L — AB (ref 18–29)
Creatinine, Ser: 0.85 mg/dL (ref 0.76–1.27)
GFR calc non Af Amer: 88 mL/min/{1.73_m2} (ref >59)
GFR, EST AFRICAN AMERICAN: 101 mL/min/{1.73_m2} (ref >59)
GLUCOSE: 77 mg/dL (ref 65–99)
POTASSIUM: 4.4 mmol/L (ref 3.5–5.2)
Sodium: 143 mmol/L (ref 134–144)

## 2015-02-18 LAB — HEPATIC FUNCTION PANEL
ALT: 9 IU/L (ref 0–44)
AST: 12 IU/L (ref 0–40)
Albumin: 3.9 g/dL (ref 3.5–4.8)
Alkaline Phosphatase: 100 IU/L (ref 39–117)
BILIRUBIN TOTAL: 0.5 mg/dL (ref 0.0–1.2)
BILIRUBIN, DIRECT: 0.19 mg/dL (ref 0.00–0.40)
TOTAL PROTEIN: 9.2 g/dL — AB (ref 6.0–8.5)

## 2015-02-18 LAB — VITAMIN D 25 HYDROXY (VIT D DEFICIENCY, FRACTURES): Vit D, 25-Hydroxy: 30.6 ng/mL (ref 30.0–100.0)

## 2015-02-18 LAB — THYROID PANEL WITH TSH
Free Thyroxine Index: 3 (ref 1.2–4.9)
T3 Uptake Ratio: 31 % (ref 24–39)
T4 TOTAL: 9.6 ug/dL (ref 4.5–12.0)
TSH: 0.823 u[IU]/mL (ref 0.450–4.500)

## 2015-02-18 LAB — LIPID PANEL
Chol/HDL Ratio: 2.7 ratio units (ref 0.0–5.0)
Cholesterol, Total: 99 mg/dL — ABNORMAL LOW (ref 100–199)
HDL: 37 mg/dL — ABNORMAL LOW (ref >39)
LDL CALC: 51 mg/dL (ref 0–99)
Triglycerides: 57 mg/dL (ref 0–149)
VLDL CHOLESTEROL CAL: 11 mg/dL (ref 5–40)

## 2015-02-18 NOTE — Telephone Encounter (Signed)
They don't think patient being compliant with checking INR's once weekly and can not reach his daughter so they wanted to reach out to us regarding this.

## 2015-02-18 NOTE — Telephone Encounter (Signed)
He has INR in office yesterday.  He is going to check next week.

## 2015-02-22 ENCOUNTER — Ambulatory Visit (INDEPENDENT_AMBULATORY_CARE_PROVIDER_SITE_OTHER): Payer: Medicare Other | Admitting: Pharmacist

## 2015-02-25 ENCOUNTER — Encounter: Payer: Self-pay | Admitting: Family Medicine

## 2015-02-25 LAB — POCT INR: INR: 2.3

## 2015-03-02 ENCOUNTER — Ambulatory Visit (INDEPENDENT_AMBULATORY_CARE_PROVIDER_SITE_OTHER): Payer: Medicare Other | Admitting: Pharmacist

## 2015-03-02 ENCOUNTER — Other Ambulatory Visit: Payer: Self-pay | Admitting: Family Medicine

## 2015-03-02 NOTE — Progress Notes (Signed)
No charge for labs or visit - INR check through home monitoring system  

## 2015-03-09 DIAGNOSIS — I4891 Unspecified atrial fibrillation: Secondary | ICD-10-CM | POA: Diagnosis not present

## 2015-03-23 LAB — POCT INR: INR: 2.1

## 2015-03-31 ENCOUNTER — Ambulatory Visit (INDEPENDENT_AMBULATORY_CARE_PROVIDER_SITE_OTHER): Payer: Medicare Other | Admitting: Pharmacist

## 2015-04-01 LAB — POCT INR: INR: 2.3

## 2015-04-02 ENCOUNTER — Ambulatory Visit: Payer: Medicare Other | Admitting: Pharmacist

## 2015-04-09 DIAGNOSIS — I4891 Unspecified atrial fibrillation: Secondary | ICD-10-CM | POA: Diagnosis not present

## 2015-04-12 ENCOUNTER — Other Ambulatory Visit: Payer: Self-pay | Admitting: *Deleted

## 2015-04-12 MED ORDER — LOSARTAN POTASSIUM 100 MG PO TABS
100.0000 mg | ORAL_TABLET | Freq: Every day | ORAL | Status: DC
Start: 2015-04-12 — End: 2015-08-11

## 2015-04-12 MED ORDER — AMLODIPINE BESYLATE 5 MG PO TABS: 7.5000 mg | ORAL_TABLET | Freq: Every day | ORAL | Status: DC

## 2015-04-12 MED ORDER — METOPROLOL TARTRATE 50 MG PO TABS: ORAL_TABLET | ORAL | Status: DC

## 2015-04-15 LAB — POCT INR: INR: 2.2

## 2015-04-16 ENCOUNTER — Ambulatory Visit (INDEPENDENT_AMBULATORY_CARE_PROVIDER_SITE_OTHER): Payer: Medicare Other | Admitting: Pharmacist

## 2015-04-16 ENCOUNTER — Ambulatory Visit: Payer: Medicare Other | Admitting: Pharmacist

## 2015-04-16 DIAGNOSIS — I480 Paroxysmal atrial fibrillation: Secondary | ICD-10-CM | POA: Diagnosis not present

## 2015-04-16 NOTE — Progress Notes (Signed)
Patient checks INR at home with Home INR monitoring.  Billing once per month interupertation fee.  Patient diagnosis - atrial fibrillation Procedure code if G0250  

## 2015-04-23 LAB — POCT INR: INR: 2.1

## 2015-04-26 ENCOUNTER — Ambulatory Visit (INDEPENDENT_AMBULATORY_CARE_PROVIDER_SITE_OTHER): Payer: Self-pay | Admitting: Pharmacist

## 2015-04-26 NOTE — Progress Notes (Signed)
No charge for labs or visit - INR check through home monitoring system  

## 2015-04-30 LAB — POCT INR: INR: 2.1

## 2015-05-02 ENCOUNTER — Other Ambulatory Visit: Payer: Self-pay | Admitting: Family Medicine

## 2015-05-05 ENCOUNTER — Ambulatory Visit (INDEPENDENT_AMBULATORY_CARE_PROVIDER_SITE_OTHER): Payer: Self-pay | Admitting: Pharmacist

## 2015-05-05 NOTE — Progress Notes (Signed)
No charge for labs or visit - INR check through home monitoring system  

## 2015-05-06 ENCOUNTER — Ambulatory Visit: Payer: Medicare Other | Admitting: Family Medicine

## 2015-05-07 ENCOUNTER — Encounter: Payer: Self-pay | Admitting: Family Medicine

## 2015-05-07 ENCOUNTER — Ambulatory Visit (INDEPENDENT_AMBULATORY_CARE_PROVIDER_SITE_OTHER): Payer: Medicare Other | Admitting: Family Medicine

## 2015-05-07 VITALS — BP 105/74 | HR 76 | Temp 101.4°F

## 2015-05-07 DIAGNOSIS — R509 Fever, unspecified: Secondary | ICD-10-CM | POA: Diagnosis not present

## 2015-05-07 DIAGNOSIS — J029 Acute pharyngitis, unspecified: Secondary | ICD-10-CM | POA: Diagnosis not present

## 2015-05-07 LAB — POCT INFLUENZA A/B
INFLUENZA A, POC: NEGATIVE
INFLUENZA B, POC: NEGATIVE

## 2015-05-07 MED ORDER — AZITHROMYCIN 250 MG PO TABS: ORAL_TABLET | ORAL | Status: DC

## 2015-05-07 MED ORDER — FLUTICASONE PROPIONATE 50 MCG/ACT NA SUSP: 1.0000 | Freq: Two times a day (BID) | NASAL | Status: DC | PRN

## 2015-05-07 NOTE — Progress Notes (Signed)
BP 105/74 mmHg  Pulse 76  Temp(Src) 101.4 F (38.6 C) (Oral)  Ht   Wt    Subjective:    Patient ID: Omar [name], male    DOB: 08/06/1945, 70 y.o.   MRN: [id number]  HPI: Omar FellingWilliam R Niazi is a 70 y.o. male presenting on 05/07/2015 for Cough and Sinusitis   HPI Fever cough and sinus congestion Patient comes in complaining of fever and cough and sinus congestion and postnasal drainage that started 3 days ago. The fever though just started today and here in the office is 101.4. He says earlier today it was 102. He denies any sick contacts that he knows of. His cough is productive of yellow-green sputum. He denies any shortness of breath or wheezing.  Relevant past medical, surgical, family and social history reviewed and updated as indicated. Interim medical history since our last visit reviewed. Allergies and medications reviewed and updated.  Review of Systems  Constitutional: Positive for fever and chills.  HENT: Positive for congestion, postnasal drip, rhinorrhea, sinus pressure, sneezing and sore throat. Negative for ear discharge, ear pain and voice change.   Eyes: Negative for pain, discharge, redness and visual disturbance.  Respiratory: Negative for shortness of breath and wheezing.   Cardiovascular: Negative for chest pain and leg swelling.  Gastrointestinal: Negative for abdominal pain, diarrhea and constipation.  Genitourinary: Negative for difficulty urinating.  Musculoskeletal: Negative for back pain and gait problem.  Skin: Negative for rash.  Neurological: Negative for syncope, light-headedness and headaches.  All other systems reviewed and are negative.   Per HPI unless specifically indicated above     Medication List       This list is accurate as of: 05/07/15  4:49 PM.  Always use your most recent med list.               albuterol 108 (90 Base) MCG/ACT inhaler  Commonly known as:  PROVENTIL HFA;VENTOLIN HFA  Inhale 2 puffs into the lungs every 6 (six)  hours as needed for wheezing or shortness of breath.     allopurinol 300 MG tablet  Commonly known as:  ZYLOPRIM  TAKE 1 TABLET DAILY     amLODipine 5 MG tablet  Commonly known as:  NORVASC  Take 1.5 tablets (7.5 mg total) by mouth daily.     atorvastatin 20 MG tablet  Commonly known as:  LIPITOR  TAKE 1 TABLET (20 MG TOTAL) BY MOUTH DAILY AT 6 PM.     atorvastatin 20 MG tablet  Commonly known as:  LIPITOR  TAKE 1 TABLET (20 MG TOTAL) BY MOUTH DAILY AT 6 PM.     azithromycin 250 MG tablet  Commonly known as:  ZITHROMAX  Take 2 the first day and then one each day after.     fluticasone 50 MCG/ACT nasal spray  Commonly known as:  FLONASE  Place 1 spray into both nostrils 2 (two) times daily as needed for allergies or rhinitis.     gabapentin 300 MG capsule  Commonly known as:  NEURONTIN  TAKE 1 CAPSULE AT 8 AM 1 AT 6 PM AND 2 AT BEDTIME     levothyroxine 125 MCG tablet  Commonly known as:  SYNTHROID, LEVOTHROID  TAKE 1 TABLET EVERY DAY     losartan 100 MG tablet  Commonly known as:  COZAAR  Take 1 tablet (100 mg total) by mouth daily.     metoprolol 50 MG tablet  Commonly known as:  LOPRESSOR  TAKE 1/2  TABLET 2 TIMES A DAY     potassium chloride SA 20 MEQ tablet  Commonly known as:  KLOR-CON M20  Take one po qd     warfarin 7.5 MG tablet  Commonly known as:  COUMADIN  TAKE 1 TABLET DAILY AT 6PM           Objective:    BP 105/74 mmHg  Pulse 76  Temp(Src) 101.4 F (38.6 C) (Oral)  Ht   Wt   Wt Readings from Last 3 Encounters:  03/10/09 350 lb (158.759 kg)    Physical Exam  Constitutional: He is oriented to person, place, and time. He appears well-developed and well-nourished. No distress.  HENT:  Right Ear: Tympanic membrane, external ear and ear canal normal.  Left Ear: Tympanic membrane, external ear and ear canal normal.  Nose: Mucosal edema and rhinorrhea present. No sinus tenderness. No epistaxis. Right sinus exhibits maxillary sinus tenderness.  Right sinus exhibits no frontal sinus tenderness. Left sinus exhibits maxillary sinus tenderness. Left sinus exhibits no frontal sinus tenderness.  Mouth/Throat: Uvula is midline and mucous membranes are normal. Posterior oropharyngeal edema and posterior oropharyngeal erythema present. No oropharyngeal exudate or tonsillar abscesses.  Eyes: Conjunctivae and EOM are normal. Pupils are equal, round, and reactive to light. Right eye exhibits no discharge. No scleral icterus.  Neck: Neck supple. No thyromegaly present.  Cardiovascular: Normal rate, regular rhythm, normal heart sounds and intact distal pulses.   No murmur heard. Pulmonary/Chest: Effort normal and breath sounds normal. No respiratory distress. He has no wheezes. He has no rales.  Musculoskeletal: Normal range of motion. He exhibits no edema.  Lymphadenopathy:    He has no cervical adenopathy.  Neurological: He is alert and oriented to person, place, and time. Coordination normal.  Skin: Skin is warm and dry. No rash noted. He is not diaphoretic.  Psychiatric: He has a normal mood and affect. His behavior is normal.  Nursing note and vitals reviewed.   Results for orders placed or performed in visit on 05/07/15  POCT Influenza A/B  Result Value Ref Range   Influenza A, POC Negative Negative   Influenza B, POC Negative Negative      Assessment & Plan:       Problem List Items Addressed This Visit    None    Visit Diagnoses    Fever, unspecified    -  Primary    Relevant Medications    azithromycin (ZITHROMAX) 250 MG tablet    fluticasone (FLONASE) 50 MCG/ACT nasal spray    Other Relevant Orders    POCT Influenza A/B (Completed)    Acute pharyngitis, unspecified etiology        Relevant Medications    azithromycin (ZITHROMAX) 250 MG tablet    fluticasone (FLONASE) 50 MCG/ACT nasal spray        Follow up plan: Return if symptoms worsen or fail to improve.  Counseling provided for all of the vaccine  components Orders Placed This Encounter  Procedures  . POCT Influenza A/B    Arville Care, MD [facility] 05/07/2015, 4:49 PM

## 2015-05-20 DIAGNOSIS — I4891 Unspecified atrial fibrillation: Secondary | ICD-10-CM | POA: Diagnosis not present

## 2015-05-20 LAB — POCT INR: INR: 1.9

## 2015-05-24 ENCOUNTER — Ambulatory Visit (INDEPENDENT_AMBULATORY_CARE_PROVIDER_SITE_OTHER): Payer: Self-pay | Admitting: Pharmacist

## 2015-05-24 ENCOUNTER — Other Ambulatory Visit: Payer: Self-pay | Admitting: Family Medicine

## 2015-05-24 MED ORDER — WARFARIN SODIUM 7.5 MG PO TABS: ORAL_TABLET | ORAL | Status: DC

## 2015-05-24 NOTE — Telephone Encounter (Signed)
done

## 2015-05-27 LAB — POCT INR: INR: 2.3

## 2015-05-31 ENCOUNTER — Ambulatory Visit (INDEPENDENT_AMBULATORY_CARE_PROVIDER_SITE_OTHER): Payer: Self-pay | Admitting: Pharmacist

## 2015-06-03 ENCOUNTER — Ambulatory Visit (INDEPENDENT_AMBULATORY_CARE_PROVIDER_SITE_OTHER): Payer: Self-pay | Admitting: Pharmacist

## 2015-06-03 LAB — POCT INR: INR: 2.3

## 2015-06-11 ENCOUNTER — Other Ambulatory Visit: Payer: Self-pay | Admitting: Family Medicine

## 2015-07-01 DIAGNOSIS — I4891 Unspecified atrial fibrillation: Secondary | ICD-10-CM | POA: Diagnosis not present

## 2015-07-01 LAB — POCT INR: INR: 2.1

## 2015-07-02 ENCOUNTER — Ambulatory Visit (INDEPENDENT_AMBULATORY_CARE_PROVIDER_SITE_OTHER): Payer: Self-pay | Admitting: Pharmacist

## 2015-07-09 ENCOUNTER — Encounter (INDEPENDENT_AMBULATORY_CARE_PROVIDER_SITE_OTHER): Payer: Self-pay

## 2015-07-09 ENCOUNTER — Ambulatory Visit: Payer: Medicare Other | Admitting: Family Medicine

## 2015-07-09 LAB — POCT INR: INR: 2.1

## 2015-07-12 ENCOUNTER — Telehealth: Payer: Self-pay | Admitting: Family Medicine

## 2015-07-12 ENCOUNTER — Encounter: Payer: Self-pay | Admitting: Family Medicine

## 2015-07-12 ENCOUNTER — Ambulatory Visit (INDEPENDENT_AMBULATORY_CARE_PROVIDER_SITE_OTHER): Payer: Medicare Other | Admitting: Pharmacist

## 2015-07-12 DIAGNOSIS — I480 Paroxysmal atrial fibrillation: Secondary | ICD-10-CM

## 2015-07-12 NOTE — Progress Notes (Signed)
Patient checks INR at home with Home INR monitoring.  Billing once per month interupertation fee.  Patient diagnosis - atrial fibrillation Procedure code if G0250  

## 2015-07-12 NOTE — Telephone Encounter (Signed)
Informed patient's daughter that patient missed his appt on 5/5 and will need to be seen

## 2015-07-15 ENCOUNTER — Other Ambulatory Visit: Payer: Self-pay | Admitting: Family Medicine

## 2015-07-16 LAB — POCT INR: INR: 2.2

## 2015-07-22 ENCOUNTER — Ambulatory Visit (INDEPENDENT_AMBULATORY_CARE_PROVIDER_SITE_OTHER): Payer: Self-pay | Admitting: Pharmacist

## 2015-07-23 LAB — POCT INR: INR: 2.1

## 2015-07-26 ENCOUNTER — Ambulatory Visit (INDEPENDENT_AMBULATORY_CARE_PROVIDER_SITE_OTHER): Payer: Self-pay | Admitting: Pharmacist

## 2015-07-30 ENCOUNTER — Other Ambulatory Visit: Payer: Self-pay | Admitting: Family Medicine

## 2015-07-30 DIAGNOSIS — I4891 Unspecified atrial fibrillation: Secondary | ICD-10-CM | POA: Diagnosis not present

## 2015-07-30 LAB — POCT INR: INR: 2.3

## 2015-08-04 ENCOUNTER — Ambulatory Visit (INDEPENDENT_AMBULATORY_CARE_PROVIDER_SITE_OTHER): Payer: Self-pay | Admitting: Pharmacist

## 2015-08-04 NOTE — Progress Notes (Signed)
No charge for labs or visit - INR check through home monitoring system  

## 2015-08-06 LAB — POCT INR: INR: 2.2

## 2015-08-09 ENCOUNTER — Ambulatory Visit (INDEPENDENT_AMBULATORY_CARE_PROVIDER_SITE_OTHER): Payer: Medicare Other | Admitting: Pharmacist

## 2015-08-09 ENCOUNTER — Ambulatory Visit: Payer: Medicare Other | Admitting: Family Medicine

## 2015-08-09 DIAGNOSIS — I480 Paroxysmal atrial fibrillation: Secondary | ICD-10-CM | POA: Diagnosis not present

## 2015-08-09 NOTE — Progress Notes (Signed)
Patient checks INR at home with Home INR monitoring.  Billing once per month interupertation fee.  Patient diagnosis - atrial fibrillation Procedure code if G0250  

## 2015-08-10 ENCOUNTER — Encounter: Payer: Medicare Other | Admitting: Family Medicine

## 2015-08-11 ENCOUNTER — Other Ambulatory Visit: Payer: Self-pay | Admitting: Family Medicine

## 2015-08-13 LAB — POCT INR: INR: 2.1

## 2015-08-20 ENCOUNTER — Encounter: Payer: Medicare Other | Admitting: Family Medicine

## 2015-08-23 ENCOUNTER — Encounter: Payer: Medicare Other | Admitting: Family Medicine

## 2015-08-23 ENCOUNTER — Ambulatory Visit (INDEPENDENT_AMBULATORY_CARE_PROVIDER_SITE_OTHER): Payer: Self-pay | Admitting: Pharmacist

## 2015-08-31 LAB — PROTIME-INR

## 2015-08-31 LAB — POCT INR: INR: 2.3

## 2015-09-01 ENCOUNTER — Ambulatory Visit (INDEPENDENT_AMBULATORY_CARE_PROVIDER_SITE_OTHER): Payer: Self-pay | Admitting: Pharmacist

## 2015-09-08 ENCOUNTER — Ambulatory Visit (INDEPENDENT_AMBULATORY_CARE_PROVIDER_SITE_OTHER): Payer: Medicare Other | Admitting: Family Medicine

## 2015-09-08 ENCOUNTER — Encounter: Payer: Self-pay | Admitting: Family Medicine

## 2015-09-08 VITALS — BP 114/69 | HR 67 | Temp 98.7°F

## 2015-09-08 DIAGNOSIS — I639 Cerebral infarction, unspecified: Secondary | ICD-10-CM

## 2015-09-08 DIAGNOSIS — E559 Vitamin D deficiency, unspecified: Secondary | ICD-10-CM | POA: Diagnosis not present

## 2015-09-08 DIAGNOSIS — I10 Essential (primary) hypertension: Secondary | ICD-10-CM

## 2015-09-08 DIAGNOSIS — N400 Enlarged prostate without lower urinary tract symptoms: Secondary | ICD-10-CM

## 2015-09-08 DIAGNOSIS — I480 Paroxysmal atrial fibrillation: Secondary | ICD-10-CM | POA: Diagnosis not present

## 2015-09-08 DIAGNOSIS — I69351 Hemiplegia and hemiparesis following cerebral infarction affecting right dominant side: Secondary | ICD-10-CM | POA: Diagnosis not present

## 2015-09-08 DIAGNOSIS — Z139 Encounter for screening, unspecified: Secondary | ICD-10-CM

## 2015-09-08 DIAGNOSIS — E785 Hyperlipidemia, unspecified: Secondary | ICD-10-CM

## 2015-09-08 DIAGNOSIS — R5383 Other fatigue: Secondary | ICD-10-CM | POA: Diagnosis not present

## 2015-09-08 DIAGNOSIS — R062 Wheezing: Secondary | ICD-10-CM

## 2015-09-08 MED ORDER — ALBUTEROL SULFATE HFA 108 (90 BASE) MCG/ACT IN AERS: 2.0000 | INHALATION_SPRAY | Freq: Four times a day (QID) | RESPIRATORY_TRACT | Status: DC | PRN

## 2015-09-08 MED ORDER — WARFARIN SODIUM 7.5 MG PO TABS: ORAL_TABLET | ORAL | Status: DC

## 2015-09-08 MED ORDER — ATORVASTATIN CALCIUM 20 MG PO TABS: ORAL_TABLET | ORAL | Status: DC

## 2015-09-08 MED ORDER — GABAPENTIN 300 MG PO CAPS: ORAL_CAPSULE | ORAL | Status: DC

## 2015-09-08 MED ORDER — AMLODIPINE BESYLATE 5 MG PO TABS: ORAL_TABLET | ORAL | Status: DC

## 2015-09-08 NOTE — Progress Notes (Signed)
Subjective:    Patient ID: Omar [name], male    DOB: [birth year], 70 y.o.   MRN: [id number]  HPI  Pt is here today for a follow up of chronic medical problems which include hypertension, hyperlipidemia, hypothyroidism and Vit D deficiency.The patient is doing well overall. He is morbidly obese. He had a CVA several years ago and has been on Coumadin since that time and does home INRs. He has no specific complaints today. His blood pressure is good vital signs are stable. He is due to receive an FOBT will get lab work today. The patient comes to the visit today with his motorized scooter and his granddaughter. He is alert and talkative and stable. He says he is able to get out on most days when the weather is clear and does stay and on bad weather days. He remains morbidly obese and of course has had the stroke in the past and has right-sided weakness. He denies any chest pain or shortness of breath. He has no trouble with his gastrointestinal tract including swallowing problems nausea vomiting diarrhea or blood in the stool. He does wear a depends. He's passing his water without problems. He responded appropriately to all questions asked of him. Patient checked his pro times at home because his atrial fibrillation and usually runs between 2 and 3 for his INR. He's he reports these readings in to [place].      Patient Active Problem List   Diagnosis Date Noted  . Thrombocytopenia ([place]) 05/04/2014  . Paralysis of right lower extremity ([place]) 09/11/2013  . Hemiparesis affecting right side as late effect of cerebrovascular accident ([place]) 09/11/2013  . Hypothyroidism 09/11/2013  . Cerebrovascular accident with involvement of right side of body () 04/17/2013  . Allergic rhinitis 06/06/2012  . Gout 06/06/2012  . Osteoarthrosis and allied disorders   . Hyperlipidemia   . Vitamin D deficiency   . Hypertension   . Asthma   . FIBRILLATION, ATRIAL 03/10/2009  . OBESITY, MORBID 03/02/2009    Outpatient Encounter Prescriptions as of 09/08/2015  Medication Sig  . albuterol (PROVENTIL HFA;VENTOLIN HFA) 108 (90 BASE) MCG/ACT inhaler Inhale 2 puffs into the lungs every 6 (six) hours as needed for wheezing or shortness of breath.  . allopurinol (ZYLOPRIM) 300 MG tablet TAKE 1 TABLET DAILY  . amLODipine (NORVASC) 5 MG tablet TAKE 1.5 TABLETS (7.5 MG TOTAL) BY MOUTH DAILY.  Marland Kitchen atorvastatin (LIPITOR) 20 MG tablet TAKE 1 TABLET (20 MG TOTAL) BY MOUTH DAILY AT 6 PM.  . fluticasone (FLONASE) 50 MCG/ACT nasal spray Place 1 spray into both nostrils 2 (two) times daily as needed for allergies or rhinitis.  Marland Kitchen gabapentin (NEURONTIN) 300 MG capsule TAKE 1 CAPSULE AT 8 AM 1 AT 6 PM AND 2 AT BEDTIME  . levothyroxine (SYNTHROID, LEVOTHROID) 125 MCG tablet TAKE 1 TABLET EVERY DAY  . losartan (COZAAR) 100 MG tablet TAKE 1 TABLET (100 MG TOTAL) BY MOUTH DAILY.  . metoprolol (LOPRESSOR) 50 MG tablet TAKE 1/2 TABLET 2 TIMES A DAY  . potassium chloride SA (KLOR-CON M20) 20 MEQ tablet Take one po qd  . warfarin (COUMADIN) 7.5 MG tablet TAKE 1 TABLET DAILY AT 6PM   No facility-administered encounter medications on file as of 09/08/2015.       Review of Systems  Constitutional: Negative.   Eyes: Negative.   Respiratory: Negative.   Cardiovascular: Negative.   Gastrointestinal: Negative.   Endocrine: Negative.   Genitourinary: Negative.   Musculoskeletal: Negative.   Skin:  Negative.   Neurological: Negative.   Hematological: Negative.   Psychiatric/Behavioral: Negative.        Objective:   Physical Exam  Constitutional: He is oriented to person, place, and time. He appears well-developed and well-nourished. No distress.  Morbidly obese and pleasant and alert male with right-sided paralysis in a motorized wheelchair accompanied by his granddaughter  HENT:  Head: Normocephalic and atraumatic.  Right Ear: External ear normal.  Left Ear: External ear normal.  Mouth/Throat: Oropharynx is clear and  moist. No oropharyngeal exudate.  Nasal congestion bilaterally  Eyes: Conjunctivae and EOM are normal. Pupils are equal, round, and reactive to light. Right eye exhibits no discharge. Left eye exhibits no discharge. No scleral icterus.  Neck: Normal range of motion. Neck supple. No thyromegaly present.  No thyromegaly or bruits or anterior cervical adenopathy  Cardiovascular: Normal rate and normal heart sounds.  Exam reveals no gallop and no friction rub.   No murmur heard. Heart is irregular irregular at 72/m  Pulmonary/Chest: Effort normal and breath sounds normal. No respiratory distress. He has no wheezes. He has no rales. He exhibits no tenderness.  Clear anteriorly and posteriorly  Abdominal: Soft. Bowel sounds are normal. He exhibits no mass. There is no tenderness. There is no rebound and no guarding.  Morbidly obese  Musculoskeletal: He exhibits edema. He exhibits no tenderness.  The patient is unable to walk because of the left-sided CVA. Pedal edema in the right leg and foot.  Lymphadenopathy:    He has no cervical adenopathy.  Neurological: He is alert and oriented to person, place, and time.  He does have some minimal grip strength on the right as well as slight movement of the lower leg but no good foot movement.  Skin: Skin is warm and dry. No rash noted.  Psychiatric: He has a normal mood and affect. His behavior is normal. Judgment and thought content normal.  Nursing note and vitals reviewed.  BP 114/69 mmHg  Pulse 67  Temp(Src) 98.7 F (37.1 C) (Oral)  Ht   Wt   SpO2 97%        Assessment & Plan:   1. Hyperlipidemia -Continue current treatment pending results of lab work -Continue with as aggressive therapeutic lifestyle changes as possible including diet and weight loss and physical activity - BMP8+EGFR - Hepatic function panel - Lipid panel  2. Vitamin D deficiency -Continue current treatment pending results of lab work - VITAMIN D 25 Hydroxy (Vit-D  Deficiency, Fractures)  3. Essential hypertension -The blood pressure is good today and he should continue with current treatment - BMP8+EGFR - CBC with Differential/Platelet  4. Hemiparesis affecting right side as late effect of cerebrovascular accident [facility]) -Despite the remoteness of the stroke he should continue to exercise all extremities and keeping good mobility as much as possible  5. Screening -Rectal exam is deferred - PSA, total and free  6. BPH (benign prostatic hypertrophy) -We will check a PSA but rectal exam is deferred - PSA, total and free  7. Other fatigue - VITAMIN D 25 Hydroxy (Vit-D Deficiency, Fractures)  8. Wheezing -The patient's breathing is good and no wheezing was noted. - albuterol (PROVENTIL HFA;VENTOLIN HFA) 108 (90 Base) MCG/ACT inhaler; Inhale 2 puffs into the lungs every 6 (six) hours as needed for wheezing or shortness of breath.  Dispense: 1 Inhaler; Refill: 1  9. Paroxysmal atrial fibrillation (H[facility]) -He remains in atrial fibrillation today at about 72/m  10. Morbid obesity due to excess calories ([place]) -  Continue with aggressive therapeutic lifestyle changes making all efforts to exercise even though he is paralyzed on the right side and watching his caloric intake.  11. Cerebrovascular accident (CVA), unspecified mechanism ([place]) -Continue exercising regularly.     Continue current medications. Continue good therapeutic lifestyle changes which include good diet and exercise. Fall precautions discussed with patient. If an FOBT was given today- please return it to our front desk. If you are over 40 years old - you may need Prevnar 20 or the adult Pneumonia vaccine.  **Flu shots are available--- please call and schedule a FLU-CLINIC appointment**  After your visit with [place] today you will receive a survey in the mail or online from [employer] regarding your care with [place]. Please take a moment to fill this out. Your feedback is very important  to [place] as you can help [place] better understand your patient needs as well as improve your experience and satisfaction. WE CARE ABOUT YOU!!!   Patient Instructions  The patient should continue with his current medicines and checking his INR on a regular basis. We will call him with his lab results as soon as they become available He should continue to be careful when out in his motorized wheelchair He should make every effort at losing weight and preventing himself from falling. He should continue to exercise his extremities is much as possible   Arrie Senate MD

## 2015-09-08 NOTE — Patient Instructions (Signed)
The patient should continue with his current medicines and checking his INR on a regular basis. We will call him with his lab results as soon as they become available He should continue to be careful when out in his motorized wheelchair He should make every effort at losing weight and preventing himself from falling. He should continue to exercise his extremities is much as possible

## 2015-09-09 LAB — BMP8+EGFR
BUN / CREAT RATIO: 20 (ref 10–24)
BUN: 17 mg/dL (ref 8–27)
CHLORIDE: 101 mmol/L (ref 96–106)
CO2: 25 mmol/L (ref 18–29)
Calcium: 9.1 mg/dL (ref 8.6–10.2)
Creatinine, Ser: 0.86 mg/dL (ref 0.76–1.27)
GFR calc non Af Amer: 87 mL/min/{1.73_m2} (ref >59)
GFR, EST AFRICAN AMERICAN: 101 mL/min/{1.73_m2} (ref >59)
Glucose: 93 mg/dL (ref 65–99)
POTASSIUM: 4.2 mmol/L (ref 3.5–5.2)
SODIUM: 142 mmol/L (ref 134–144)

## 2015-09-09 LAB — LIPID PANEL
CHOLESTEROL TOTAL: 117 mg/dL (ref 100–199)
Chol/HDL Ratio: 2.9 ratio units (ref 0.0–5.0)
HDL: 40 mg/dL (ref >39)
LDL CALC: 65 mg/dL (ref 0–99)
TRIGLYCERIDES: 59 mg/dL (ref 0–149)
VLDL Cholesterol Cal: 12 mg/dL (ref 5–40)

## 2015-09-09 LAB — PSA, TOTAL AND FREE
PSA FREE: 0.3 ng/mL
PSA, Free Pct: 42.9 %
Prostate Specific Ag, Serum: 0.7 ng/mL (ref 0.0–4.0)

## 2015-09-09 LAB — CBC WITH DIFFERENTIAL/PLATELET
BASOS ABS: 0 10*3/uL (ref 0.0–0.2)
Basos: 0 %
EOS (ABSOLUTE): 0.2 10*3/uL (ref 0.0–0.4)
Eos: 6 %
Hematocrit: 41.1 % (ref 37.5–51.0)
Hemoglobin: 13.7 g/dL (ref 12.6–17.7)
Immature Grans (Abs): 0 10*3/uL (ref 0.0–0.1)
Immature Granulocytes: 0 %
LYMPHS ABS: 1.2 10*3/uL (ref 0.7–3.1)
Lymphs: 34 %
MCH: 32.7 pg (ref 26.6–33.0)
MCHC: 33.3 g/dL (ref 31.5–35.7)
MCV: 98 fL — ABNORMAL HIGH (ref 79–97)
MONOS ABS: 0.4 10*3/uL (ref 0.1–0.9)
Monocytes: 13 %
Neutrophils Absolute: 1.7 10*3/uL (ref 1.4–7.0)
Neutrophils: 47 %
Platelets: 112 10*3/uL — ABNORMAL LOW (ref 150–379)
RBC: 4.19 x10E6/uL (ref 4.14–5.80)
RDW: 14.7 % (ref 12.3–15.4)
WBC: 3.5 10*3/uL (ref 3.4–10.8)

## 2015-09-09 LAB — HEPATIC FUNCTION PANEL
ALBUMIN: 4 g/dL (ref 3.5–4.8)
ALK PHOS: 101 IU/L (ref 39–117)
ALT: 5 IU/L (ref 0–44)
AST: 12 IU/L (ref 0–40)
BILIRUBIN, DIRECT: 0.23 mg/dL (ref 0.00–0.40)
Bilirubin Total: 1 mg/dL (ref 0.0–1.2)
TOTAL PROTEIN: 9.1 g/dL — AB (ref 6.0–8.5)

## 2015-09-09 LAB — VITAMIN D 25 HYDROXY (VIT D DEFICIENCY, FRACTURES): Vit D, 25-Hydroxy: 27.5 ng/mL — ABNORMAL LOW (ref 30.0–100.0)

## 2015-09-10 DIAGNOSIS — I4891 Unspecified atrial fibrillation: Secondary | ICD-10-CM | POA: Diagnosis not present

## 2015-09-10 LAB — POCT INR: INR: 2.2

## 2015-09-14 ENCOUNTER — Ambulatory Visit (INDEPENDENT_AMBULATORY_CARE_PROVIDER_SITE_OTHER): Payer: Medicare Other | Admitting: Pharmacist

## 2015-09-14 ENCOUNTER — Other Ambulatory Visit: Payer: Self-pay | Admitting: Family Medicine

## 2015-09-14 DIAGNOSIS — I480 Paroxysmal atrial fibrillation: Secondary | ICD-10-CM

## 2015-09-17 LAB — POCT INR: INR: 2.2

## 2015-09-21 ENCOUNTER — Encounter: Payer: Self-pay | Admitting: Family Medicine

## 2015-09-22 ENCOUNTER — Ambulatory Visit (INDEPENDENT_AMBULATORY_CARE_PROVIDER_SITE_OTHER): Payer: Self-pay | Admitting: Pharmacist

## 2015-10-05 ENCOUNTER — Ambulatory Visit (INDEPENDENT_AMBULATORY_CARE_PROVIDER_SITE_OTHER): Payer: Self-pay | Admitting: Pharmacist

## 2015-10-05 LAB — POCT INR: INR: 2.1

## 2015-10-08 DIAGNOSIS — I4891 Unspecified atrial fibrillation: Secondary | ICD-10-CM | POA: Diagnosis not present

## 2015-10-08 LAB — POCT INR: INR: 2.1

## 2015-10-12 ENCOUNTER — Ambulatory Visit (INDEPENDENT_AMBULATORY_CARE_PROVIDER_SITE_OTHER): Payer: Medicare Other | Admitting: Pharmacist

## 2015-10-13 ENCOUNTER — Telehealth: Payer: Self-pay | Admitting: Family Medicine

## 2015-10-13 ENCOUNTER — Other Ambulatory Visit: Payer: Self-pay | Admitting: *Deleted

## 2015-10-13 MED ORDER — WARFARIN SODIUM 7.5 MG PO TABS: ORAL_TABLET | ORAL | 2 refills | Status: DC

## 2015-10-13 NOTE — Telephone Encounter (Signed)
Pt med sent to [facility] per request

## 2015-10-13 NOTE — Telephone Encounter (Signed)
Pt calls and states he lost his warfarin - new script sent to pharm

## 2015-10-15 LAB — POCT INR: INR: 2.1

## 2015-10-20 ENCOUNTER — Other Ambulatory Visit: Payer: Self-pay | Admitting: Family Medicine

## 2015-10-21 ENCOUNTER — Ambulatory Visit: Payer: Self-pay | Admitting: Pharmacist

## 2015-10-22 LAB — POCT INR: INR: 2.2

## 2015-10-28 ENCOUNTER — Ambulatory Visit (INDEPENDENT_AMBULATORY_CARE_PROVIDER_SITE_OTHER): Payer: Self-pay | Admitting: Pharmacist

## 2015-10-29 LAB — POCT INR: INR: 2.2

## 2015-11-01 ENCOUNTER — Ambulatory Visit: Payer: Self-pay | Admitting: Pharmacist

## 2015-11-12 DIAGNOSIS — I4891 Unspecified atrial fibrillation: Secondary | ICD-10-CM | POA: Diagnosis not present

## 2015-11-12 LAB — POCT INR: INR: 2.1

## 2015-11-13 ENCOUNTER — Other Ambulatory Visit: Payer: Self-pay | Admitting: Family Medicine

## 2015-11-18 ENCOUNTER — Ambulatory Visit: Payer: Self-pay | Admitting: Pharmacist

## 2015-11-18 NOTE — Consult Note (Signed)
No charge for labs or visit - INR check through home monitoring system  

## 2015-11-19 LAB — POCT INR: INR: 2.2

## 2015-11-23 ENCOUNTER — Ambulatory Visit (INDEPENDENT_AMBULATORY_CARE_PROVIDER_SITE_OTHER): Payer: Medicare Other

## 2015-11-23 DIAGNOSIS — Z23 Encounter for immunization: Secondary | ICD-10-CM | POA: Diagnosis not present

## 2015-11-26 ENCOUNTER — Other Ambulatory Visit: Payer: Self-pay | Admitting: Family Medicine

## 2015-11-29 ENCOUNTER — Ambulatory Visit (INDEPENDENT_AMBULATORY_CARE_PROVIDER_SITE_OTHER): Payer: Self-pay | Admitting: Pharmacist

## 2015-12-09 ENCOUNTER — Ambulatory Visit (INDEPENDENT_AMBULATORY_CARE_PROVIDER_SITE_OTHER): Payer: Medicare Other | Admitting: Family Medicine

## 2015-12-09 ENCOUNTER — Encounter: Payer: Self-pay | Admitting: Family Medicine

## 2015-12-09 ENCOUNTER — Encounter: Payer: Self-pay | Admitting: Pharmacist

## 2015-12-09 VITALS — BP 182/107 | HR 69 | Temp 98.9°F

## 2015-12-09 DIAGNOSIS — R062 Wheezing: Secondary | ICD-10-CM

## 2015-12-09 DIAGNOSIS — I639 Cerebral infarction, unspecified: Secondary | ICD-10-CM | POA: Diagnosis not present

## 2015-12-09 DIAGNOSIS — R05 Cough: Secondary | ICD-10-CM

## 2015-12-09 DIAGNOSIS — I10 Essential (primary) hypertension: Secondary | ICD-10-CM | POA: Diagnosis not present

## 2015-12-09 DIAGNOSIS — J209 Acute bronchitis, unspecified: Secondary | ICD-10-CM | POA: Diagnosis not present

## 2015-12-09 DIAGNOSIS — I480 Paroxysmal atrial fibrillation: Secondary | ICD-10-CM

## 2015-12-09 DIAGNOSIS — R059 Cough, unspecified: Secondary | ICD-10-CM

## 2015-12-09 MED ORDER — CEFDINIR 300 MG PO CAPS: 300.0000 mg | ORAL_CAPSULE | Freq: Two times a day (BID) | ORAL | 0 refills | Status: DC

## 2015-12-09 MED ORDER — ALBUTEROL SULFATE HFA 108 (90 BASE) MCG/ACT IN AERS: 2.0000 | INHALATION_SPRAY | Freq: Four times a day (QID) | RESPIRATORY_TRACT | 6 refills | Status: AC | PRN

## 2015-12-09 NOTE — Progress Notes (Signed)
Subjective:    Patient ID: Omar [name], male    DOB: 07-02-1945, 70 y.o.   MRN: [id number]  HPI Patient here today for sinus trouble and wheezing that started about 2 days ago.Unfortunately the patient has not taken his blood pressure medicine today. He has a history of a past CVA. He has hemiparalysis. He is alert. He complains of sinus congestion and drainage slight left facial pain and some mild wheezes at nighttime with sleeping. He is not been running a fever and does not complain of a sore throat.      Patient Active Problem List   Diagnosis Date Noted  . Thrombocytopenia ([facility]) 05/04/2014  . Paralysis of right lower extremity ([facility]) 09/11/2013  . Hemiparesis affecting right side as late effect of cerebrovascular accident ([facility]) 09/11/2013  . Hypothyroidism 09/11/2013  . Cerebrovascular accident with involvement of right side of body ([facility]) 04/17/2013  . Allergic rhinitis 06/06/2012  . Gout 06/06/2012  . Osteoarthrosis and allied disorders   . Hyperlipidemia   . Vitamin D deficiency   . Hypertension   . Asthma   . Paroxysmal atrial fibrillation ([facility]) 03/10/2009  . OBESITY, MORBID 03/02/2009   Outpatient Encounter Prescriptions as of 12/09/2015  Medication Sig  . albuterol (PROVENTIL HFA;VENTOLIN HFA) 108 (90 Base) MCG/ACT inhaler Inhale 2 puffs into the lungs every 6 (six) hours as needed for wheezing or shortness of breath.  . allopurinol (ZYLOPRIM) 300 MG tablet TAKE 1 TABLET DAILY  . amLODipine (NORVASC) 5 MG tablet TAKE 1.5 TABLETS (7.5 MG TOTAL) BY MOUTH DAILY.  Marland Kitchen. atorvastatin (LIPITOR) 20 MG tablet TAKE 1 TABLET (20 MG TOTAL) BY MOUTH DAILY AT 6 PM.  . fluticasone (FLONASE) 50 MCG/ACT nasal spray Place 1 spray into both nostrils 2 (two) times daily as needed for allergies or rhinitis.  Marland Kitchen. gabapentin (NEURONTIN) 300 MG capsule TAKE 1 CAPSULE AT 8 AM 1 AT 6 PM AND 2 AT BEDTIME  . KLOR-CON M20 20 MEQ tablet TAKE 1 TABLET BY MOUTH EVERY DAY  . levothyroxine (SYNTHROID,  LEVOTHROID) 125 MCG tablet TAKE 1 TABLET EVERY DAY  . losartan (COZAAR) 100 MG tablet TAKE 1 TABLET (100 MG TOTAL) BY MOUTH DAILY.  . metoprolol (LOPRESSOR) 50 MG tablet TAKE 1/2 TABLET 2 TIMES A DAY  . warfarin (COUMADIN) 7.5 MG tablet TAKE 1 TABLET DAILY AT 6PM  . [DISCONTINUED] albuterol (PROVENTIL HFA;VENTOLIN HFA) 108 (90 Base) MCG/ACT inhaler Inhale 2 puffs into the lungs every 6 (six) hours as needed for wheezing or shortness of breath.  . [DISCONTINUED] gabapentin (NEURONTIN) 300 MG capsule TAKE 1 CAPSULE AT 8 AM 1 AT 6 PM AND 2 AT BEDTIME   No facility-administered encounter medications on file as of 12/09/2015.      Review of Systems  Constitutional: Negative.   HENT: Positive for postnasal drip and sinus pressure.   Eyes: Negative.   Respiratory: Positive for wheezing. Negative for shortness of breath.   Cardiovascular: Negative.   Gastrointestinal: Negative.   Endocrine: Negative.   Genitourinary: Negative.   Musculoskeletal: Negative.   Skin: Negative.   Allergic/Immunologic: Negative.   Neurological: Negative.   Hematological: Negative.   Psychiatric/Behavioral: Negative.        Objective:   Physical Exam  Constitutional: He is oriented to person, place, and time. He appears well-developed and well-nourished. No distress.  HENT:  Head: Normocephalic and atraumatic.  Right Ear: External ear normal.  Left Ear: External ear normal.  Mouth/Throat: Oropharynx is clear and moist. No oropharyngeal exudate.  There is slight tenderness over the left maxillary area. Slight nasal congestion bilaterally  Eyes: Conjunctivae and EOM are normal. Pupils are equal, round, and reactive to light. Right eye exhibits no discharge. Left eye exhibits no discharge. No scleral icterus.  Neck: Normal range of motion. Neck supple. No thyromegaly present.  No thyromegaly or anterior cervical adenopathy  Cardiovascular: Normal rate and normal heart sounds.   No murmur heard. Slightly  irregular at 72/m  Pulmonary/Chest: Effort normal and breath sounds normal. No respiratory distress. He has no wheezes. He has no rales.  Abdominal: He exhibits no mass.  Musculoskeletal: He exhibits no edema.  The patient is wheelchair bound because of his hemiparalysis secondary to CVA  Lymphadenopathy:    He has no cervical adenopathy.  Neurological: He is alert and oriented to person, place, and time.  Skin: Skin is warm and dry. No rash noted.  Psychiatric: He has a normal mood and affect. His behavior is normal. Judgment and thought content normal.  Nursing note and vitals reviewed.  BP (!) 182/107 (BP Location: Left Arm)   Pulse 69   Temp 98.9 F (37.2 C) (Oral)         Assessment & Plan:  1. Wheezing -Brio inhaler 1 puff once daily and rinse mouth after using - albuterol (PROVENTIL HFA;VENTOLIN HFA) 108 (90 Base) MCG/ACT inhaler; Inhale 2 puffs into the lungs every 6 (six) hours as needed for wheezing or shortness of breath.  Dispense: 1 Inhaler; Refill: 6  2. Essential hypertension -Go home immediately and take blood pressure medicine today. Return to the office in 10-14 days and have blood pressure rechecked  3. Paroxysmal atrial fibrillation (HCC) -Continue with Coumadin  4. Cough -Take Mucinex for cough and congestion  5. Bronchitis with bronchospasm -Take Omnicef 1 twice daily until completed and use Brio inhaler regularly and use albuterol inhaler as needed as a rescue inhaler  Meds ordered this encounter  Medications  . albuterol (PROVENTIL HFA;VENTOLIN HFA) 108 (90 Base) MCG/ACT inhaler    Sig: Inhale 2 puffs into the lungs every 6 (six) hours as needed for wheezing or shortness of breath.    Dispense:  1 Inhaler    Refill:  6  . cefdinir (OMNICEF) 300 MG capsule    Sig: Take 1 capsule (300 mg total) by mouth 2 (two) times daily. 1 po BID    Dispense:  20 capsule    Refill:  0   Patient Instructions  Take antibiotic twice daily until completed Go  home immediately and take blood pressure medicine Return to clinic in 10 days and get a flu shot and have blood pressure rechecked Take Mucinex one twice daily for cough and congestion with a large glass of water Use nasal saline 1 spray each nostril 3 or 4 times daily Use Brio inhaler 1 puff once daily and rinse mouth after using  Nyra Capes MD

## 2015-12-09 NOTE — Patient Instructions (Signed)
Take antibiotic twice daily until completed Go home immediately and take blood pressure medicine Return to clinic in 10 days and get a flu shot and have blood pressure rechecked Take Mucinex one twice daily for cough and congestion with a large glass of water Use nasal saline 1 spray each nostril 3 or 4 times daily Use Brio inhaler 1 puff once daily and rinse mouth after using

## 2015-12-10 DIAGNOSIS — I480 Paroxysmal atrial fibrillation: Secondary | ICD-10-CM | POA: Diagnosis not present

## 2015-12-10 LAB — POCT INR: INR: 2.2

## 2015-12-13 ENCOUNTER — Ambulatory Visit (INDEPENDENT_AMBULATORY_CARE_PROVIDER_SITE_OTHER): Payer: Self-pay | Admitting: Pharmacist

## 2015-12-24 LAB — POCT INR: INR: 2.1

## 2015-12-27 ENCOUNTER — Ambulatory Visit: Payer: Self-pay | Admitting: Pharmacist

## 2015-12-27 NOTE — Progress Notes (Signed)
Patient ID: Omar FellingWilliam R [name], male   DOB: 02-03-1946, 70 y.o.   MRN: [id number]   No charge for labs or visit - INR check through home monitoring system

## 2015-12-31 LAB — POCT INR: INR: 2.1

## 2016-01-04 ENCOUNTER — Ambulatory Visit: Payer: Self-pay | Admitting: Pharmacist

## 2016-01-05 ENCOUNTER — Other Ambulatory Visit: Payer: Self-pay | Admitting: Family Medicine

## 2016-01-07 DIAGNOSIS — I480 Paroxysmal atrial fibrillation: Secondary | ICD-10-CM | POA: Diagnosis not present

## 2016-01-07 LAB — POCT INR: INR: 1.6

## 2016-01-10 ENCOUNTER — Ambulatory Visit: Payer: Medicare Other | Admitting: Family Medicine

## 2016-01-11 ENCOUNTER — Encounter: Payer: Medicare Other | Admitting: Pharmacist

## 2016-01-11 ENCOUNTER — Ambulatory Visit (INDEPENDENT_AMBULATORY_CARE_PROVIDER_SITE_OTHER): Payer: Medicare Other | Admitting: Pharmacist

## 2016-01-11 DIAGNOSIS — I4891 Unspecified atrial fibrillation: Secondary | ICD-10-CM

## 2016-01-11 NOTE — Consult Note (Signed)
Subjective:     Indication: atrial fibrillation Bleeding signs/symptoms: None Thromboembolic signs/symptoms: None  Missed Coumadin doses: Over one week ago -  Missed 1 dose Medication changes: no Dietary changes: yes - increase in green leafy vegetables Bacterial/viral infection: no Other concerns: no   Objective:    INR was 1.6 on 01/07/2016 however results not reported to our office until 01/11/2016.  Current dose: warfarin 7.5mg  - take 7.5mg  MWF and 3.75mg  all other days    Assessment:    Subtherapeutic INR for goal of 2-3      Plan:    1. New dose: patient to take 7.5mg  instead of 3.75mg  today (tuesday, 11/7) then resume usual dose of 7.5mg  MWF and 3.75mg  all other days.   2. Next INR: 1 week   3.  [facility] they are checked so me can make timely recommendations about adjusting warfarin dose.    Patient checks INR at home with Home INR monitoring.  Billing once per month interupertation fee.  Patient diagnosis - atrial flutter Procedure code if [id number]

## 2016-01-20 ENCOUNTER — Ambulatory Visit: Payer: Medicare Other | Admitting: Family Medicine

## 2016-01-24 LAB — POCT INR: INR: 1.3

## 2016-01-25 ENCOUNTER — Ambulatory Visit (INDEPENDENT_AMBULATORY_CARE_PROVIDER_SITE_OTHER): Payer: Self-pay | Admitting: Pharmacist

## 2016-01-25 ENCOUNTER — Telehealth: Payer: Self-pay

## 2016-01-25 NOTE — Telephone Encounter (Signed)
Current warfarin dose of 7.5mg  MWF and 3.75mg  all other days. Patient advised to take 1 and 1/2 tablets (11.25mg ) today. Then increase dose to 7.5mg  daily except 3.75mg  on sundays and wednesdays.  Recheck INR in 1 week

## 2016-01-31 LAB — POCT INR: INR: 1.9

## 2016-02-01 ENCOUNTER — Ambulatory Visit: Payer: Medicare Other | Admitting: Family Medicine

## 2016-02-07 ENCOUNTER — Ambulatory Visit (INDEPENDENT_AMBULATORY_CARE_PROVIDER_SITE_OTHER): Payer: Self-pay | Admitting: Pharmacist

## 2016-02-08 ENCOUNTER — Other Ambulatory Visit: Payer: Self-pay | Admitting: Family Medicine

## 2016-02-11 LAB — POCT INR: INR: 1.9

## 2016-02-14 ENCOUNTER — Ambulatory Visit: Payer: Self-pay | Admitting: Pharmacist

## 2016-02-18 ENCOUNTER — Encounter: Payer: Self-pay | Admitting: Family Medicine

## 2016-02-18 DIAGNOSIS — I480 Paroxysmal atrial fibrillation: Secondary | ICD-10-CM | POA: Diagnosis not present

## 2016-02-22 ENCOUNTER — Encounter: Payer: Self-pay | Admitting: Family Medicine

## 2016-02-22 ENCOUNTER — Ambulatory Visit (INDEPENDENT_AMBULATORY_CARE_PROVIDER_SITE_OTHER): Payer: Medicare Other | Admitting: Family Medicine

## 2016-02-22 ENCOUNTER — Telehealth: Payer: Self-pay | Admitting: Family Medicine

## 2016-02-22 VITALS — BP 125/76 | HR 55 | Temp 97.5°F

## 2016-02-22 DIAGNOSIS — E7800 Pure hypercholesterolemia, unspecified: Secondary | ICD-10-CM | POA: Diagnosis not present

## 2016-02-22 DIAGNOSIS — J00 Acute nasopharyngitis [common cold]: Secondary | ICD-10-CM | POA: Diagnosis not present

## 2016-02-22 DIAGNOSIS — I69351 Hemiplegia and hemiparesis following cerebral infarction affecting right dominant side: Secondary | ICD-10-CM | POA: Diagnosis not present

## 2016-02-22 DIAGNOSIS — E559 Vitamin D deficiency, unspecified: Secondary | ICD-10-CM | POA: Diagnosis not present

## 2016-02-22 DIAGNOSIS — D696 Thrombocytopenia, unspecified: Secondary | ICD-10-CM | POA: Diagnosis not present

## 2016-02-22 DIAGNOSIS — I639 Cerebral infarction, unspecified: Secondary | ICD-10-CM

## 2016-02-22 DIAGNOSIS — I10 Essential (primary) hypertension: Secondary | ICD-10-CM

## 2016-02-22 DIAGNOSIS — I480 Paroxysmal atrial fibrillation: Secondary | ICD-10-CM | POA: Diagnosis not present

## 2016-02-22 MED ORDER — AMOXICILLIN 500 MG PO CAPS: 500.0000 mg | ORAL_CAPSULE | Freq: Three times a day (TID) | ORAL | 0 refills | Status: DC

## 2016-02-22 NOTE — Telephone Encounter (Signed)
Pt called - appt made  ?

## 2016-02-22 NOTE — Progress Notes (Signed)
 Subjective:    Patient ID: Omar [name], male    DOB: 05/12/1945, 70 y.o.   MRN: [id number]  HPI Pt here for follow up and management of chronic medical problems which includes hyperlipidemia and hypertension. He is taking medication regularly.It is important to note that the patient recently lost his wife with lung cancer. She also had dementia. Omar [name] had a stroke several years ago and he is able to get here on his motorized wheelchair. He is somehow been able to survive at home with a wheelchair not being able to walk regularly. He will be given an FOBT to return and will get lab work today. The patient checks his pro times at home. His vital signs today are stable. The patient denies any chest pain shortness of breath other than the head congestion and trouble with his stomach nausea vomiting diarrhea blood in the stool or black tarry bowel movements. He is passing his water well. He is still paralyzed on the right side and only has minimal movement with his hand and with his right lower leg.    Patient Active Problem List   Diagnosis Date Noted  . Thrombocytopenia (HCC) 05/04/2014  . Paralysis of right lower extremity (HCC) 09/11/2013  . Hemiparesis affecting right side as late effect of cerebrovascular accident (HCC) 09/11/2013  . Hypothyroidism 09/11/2013  . Cerebrovascular accident with involvement of right side of body (HCC) 04/17/2013  . Allergic rhinitis 06/06/2012  . Gout 06/06/2012  . Osteoarthrosis and allied disorders   . Hyperlipidemia   . Vitamin D deficiency   . Hypertension   . Asthma   . Paroxysmal atrial fibrillation (HCC) 03/10/2009  . OBESITY, MORBID 03/02/2009   Outpatient Encounter Prescriptions as of 02/22/2016  Medication Sig  . albuterol (PROVENTIL HFA;VENTOLIN HFA) 108 (90 Base) MCG/ACT inhaler Inhale 2 puffs into the lungs every 6 (six) hours as needed for wheezing or shortness of breath.  . allopurinol (ZYLOPRIM) 300 MG tablet TAKE 1 TABLET DAILY  .  amLODipine (NORVASC) 5 MG tablet TAKE 1.5 TABLETS (7.5 MG TOTAL) BY MOUTH DAILY.  . atorvastatin (LIPITOR) 20 MG tablet TAKE 1 TABLET (20 MG TOTAL) BY MOUTH DAILY AT 6 PM.  . fluticasone (FLONASE) 50 MCG/ACT nasal spray Place 1 spray into both nostrils 2 (two) times daily as needed for allergies or rhinitis.  . gabapentin (NEURONTIN) 300 MG capsule TAKE 1 CAPSULE AT 8 AM 1 AT 6 PM AND 2 AT BEDTIME  . KLOR-CON M20 20 MEQ tablet TAKE 1 TABLET BY MOUTH EVERY DAY  . levothyroxine (SYNTHROID, LEVOTHROID) 125 MCG tablet TAKE 1 TABLET EVERY DAY  . losartan (COZAAR) 100 MG tablet TAKE 1 TABLET (100 MG TOTAL) BY MOUTH DAILY.  . metoprolol (LOPRESSOR) 50 MG tablet TAKE 1/2 TABLET 2 TIMES A DAY  . warfarin (COUMADIN) 7.5 MG tablet TAKE 1 TABLET DAILY AT 6PM   No facility-administered encounter medications on file as of 02/22/2016.       Review of Systems  Constitutional: Negative.   HENT: Positive for congestion and sinus pressure.   Eyes: Negative.   Respiratory: Negative.   Cardiovascular: Negative.   Gastrointestinal: Negative.   Endocrine: Negative.   Genitourinary: Negative.   Musculoskeletal: Negative.   Skin: Negative.   Allergic/Immunologic: Negative.   Neurological: Negative.   Hematological: Negative.   Psychiatric/Behavioral: Negative.        Objective:   Physical Exam  Constitutional: He is oriented to person, place, and time. He appears well-developed and well-nourished. No distress.    HENT:  Head: Normocephalic and atraumatic.  Right Ear: External ear normal.  Left Ear: External ear normal.  Mouth/Throat: Oropharynx is clear and moist. No oropharyngeal exudate.  Minimal tenderness to sinus pressure. Nasal congestion and turbinate swelling bilaterally  Eyes: Conjunctivae and EOM are normal. Pupils are equal, round, and reactive to light. Right eye exhibits no discharge. Left eye exhibits no discharge. No scleral icterus.  Neck: Normal range of motion. Neck supple. No  thyromegaly present.  Anterior cervical adenopathy not present  Cardiovascular: Normal rate and normal heart sounds.   No murmur heard. The heart was irregular irregular at 60/m  Pulmonary/Chest: Effort normal and breath sounds normal. No respiratory distress. He has no wheezes. He has no rales. He exhibits no tenderness.  Dry cough and no wheezes currently.  Abdominal: Soft. Bowel sounds are normal. He exhibits no mass. There is no tenderness. There is no rebound and no guarding.  Morbidly obese abdomen without obvious masses or organ enlargement. No tenderness.  Musculoskeletal: He exhibits edema. He exhibits no tenderness.  The patient is unable to ambulate due to CVA affecting right upper extremity and right lower extremity.  Lymphadenopathy:    He has no cervical adenopathy.  Neurological: He is alert and oriented to person, place, and time.  Skin: Skin is warm and dry. No rash noted.  Psychiatric: He has a normal mood and affect. His behavior is normal. Judgment and thought content normal.  The mood is somewhat down simply because he just lost his wife. His grandchildren are in the house regularly and helping and assisting him with taking care of himself.  Nursing note and vitals reviewed.  BP 125/76 (BP Location: Left Arm)   Pulse (!) 55   Temp 97.5 F (36.4 C) (Oral)         Assessment & Plan:  1. Essential hypertension -The blood pressure is good today the patient will continue with current treatment - BMP8+EGFR - CBC with Differential/Platelet - Hepatic function panel  2. Paroxysmal atrial fibrillation (HCC) -He remains in atrial fibrillation and will continue with his Coumadin with regular pro time checks - CBC with Differential/Platelet  3. Pure hypercholesterolemia -Continue with aggressive therapeutic lifestyle changes pending results of lab work - CBC with Differential/Platelet - Lipid panel  4. Vitamin D deficiency -Continue with vitamin D replacement  pending results of lab work - CBC with Differential/Platelet - VITAMIN D 25 Hydroxy (Vit-D Deficiency, Fractures)  5. Hemiparesis affecting right side as late effect of cerebrovascular accident [facility]) -Continue with motorized wheelchair making sure that he makes all efforts to keep from falling and injuring himself  6. Morbid obesity due to excess calories ([place]) -Continue with aggressive therapeutic lifestyle changes through diet and exercise as possible  7. Thrombocytopenia (HCC) -No bleeding issues in history.  8. Cerebrovascular accident with involvement of right side of body ([place]) -Continue to exercise involved extremities as much as possible  9. Acute nasopharyngitis -Use nasal saline frequently and Mucinex as needed for cough and congestion - amoxicillin (AMOXIL) 500 MG capsule; Take 1 capsule (500 mg total) by mouth 3 (three) times daily.  Dispense: 30 capsule; Refill: 0  Patient Instructions                       Medicare Annual Wellness Visit  [place] and the medical providers at [facility] strive to bring you the best medical care.  In doing so we not only want to address your current  medical conditions and concerns but also to detect new conditions early and prevent illness, disease and health-related problems.    Medicare offers a yearly Wellness Visit which allows our clinical staff to assess your need for preventative services including immunizations, lifestyle education, counseling to decrease risk of preventable diseases and screening for fall risk and other medical concerns.    This visit is provided free of charge (no copay) for all Medicare recipients. The clinical pharmacists at [facility] have begun to conduct these Wellness Visits which will also include a thorough review of all your medications.    As you primary medical provider recommend that you make an appointment for your Annual Wellness Visit if you have not  done so already this year.  You may set up this appointment before you leave today or you may call back ([phone]) and schedule an appointment.  Please make sure when you call that you mention that you are scheduling your Annual Wellness Visit with the clinical pharmacist so that the appointment may be made for the proper length of time.     Continue current medications. Continue good therapeutic lifestyle changes which include good diet and exercise. Fall precautions discussed with patient. If an FOBT was given today- please return it to our front desk. If you are over 41 years old - you may need Prevnar 22 or the adult Pneumonia vaccine.  **Flu shots are available--- please call and schedule a FLU-CLINIC appointment**  After your visit with [place] today you will receive a survey in the mail or online from [employer] regarding your care with [place]. Please take a moment to fill this out. Your feedback is very important to [place] as you can help [place] better understand your patient needs as well as improve your experience and satisfaction. WE CARE ABOUT YOU!!!   Drink plenty of fluids and stay well hydrated Use nasal saline spray in each nostril 3 or 4 times daily Use Mucinex maximum strength blue and white in color one twice daily with a large glass of water if needed for cough and congestion Take antibiotic until completed    Arrie Senate MD

## 2016-02-22 NOTE — Patient Instructions (Addendum)
Medicare Annual Wellness Visit  [place] and the medical providers at [facility] strive to bring you the best medical care.  In doing so we not only want to address your current medical conditions and concerns but also to detect new conditions early and prevent illness, disease and health-related problems.    Medicare offers a yearly Wellness Visit which allows our clinical staff to assess your need for preventative services including immunizations, lifestyle education, counseling to decrease risk of preventable diseases and screening for fall risk and other medical concerns.    This visit is provided free of charge (no copay) for all Medicare recipients. The clinical pharmacists at [facility] have begun to conduct these Wellness Visits which will also include a thorough review of all your medications.    As you primary medical provider recommend that you make an appointment for your Annual Wellness Visit if you have not done so already this year.  You may set up this appointment before you leave today or you may call back [phone] and schedule an appointment.  Please make sure when you call that you mention that you are scheduling your Annual Wellness Visit with the clinical pharmacist so that the appointment may be made for the proper length of time.     Continue current medications. Continue good therapeutic lifestyle changes which include good diet and exercise. Fall precautions discussed with patient. If an FOBT was given today- please return it to our front desk. If you are over 72 years old - you may need Prevnar 13 or the adult Pneumonia vaccine.  **Flu shots are available--- please call and schedule a FLU-CLINIC appointment**  After your visit with us today you will receive a survey in the mail or online from [employer] regarding your care with us. Please take a moment to fill this out. Your feedback is very  important to us as you can help us better understand your patient needs as well as improve your experience and satisfaction. WE CARE ABOUT YOU!!!   Drink plenty of fluids and stay well hydrated Use nasal saline spray in each nostril 3 or 4 times daily Use Mucinex maximum strength blue and white in color one twice daily with a large glass of water if needed for cough and congestion Take antibiotic until completed

## 2016-02-23 LAB — HEPATIC FUNCTION PANEL
ALBUMIN: 4 g/dL (ref 3.5–4.8)
ALK PHOS: 97 IU/L (ref 39–117)
ALT: 10 IU/L (ref 0–44)
AST: 13 IU/L (ref 0–40)
Bilirubin Total: 0.4 mg/dL (ref 0.0–1.2)
Bilirubin, Direct: 0.14 mg/dL (ref 0.00–0.40)
TOTAL PROTEIN: 9.1 g/dL — AB (ref 6.0–8.5)

## 2016-02-23 LAB — CBC WITH DIFFERENTIAL/PLATELET
BASOS ABS: 0 10*3/uL (ref 0.0–0.2)
Basos: 1 %
EOS (ABSOLUTE): 0.3 10*3/uL (ref 0.0–0.4)
Eos: 6 %
Hematocrit: 39.4 % (ref 37.5–51.0)
Hemoglobin: 13.3 g/dL (ref 13.0–17.7)
Immature Grans (Abs): 0 10*3/uL (ref 0.0–0.1)
Immature Granulocytes: 1 %
LYMPHS ABS: 1.5 10*3/uL (ref 0.7–3.1)
Lymphs: 34 %
MCH: 33.4 pg — ABNORMAL HIGH (ref 26.6–33.0)
MCHC: 33.8 g/dL (ref 31.5–35.7)
MCV: 99 fL — ABNORMAL HIGH (ref 79–97)
MONOCYTES: 18 %
Monocytes Absolute: 0.8 10*3/uL (ref 0.1–0.9)
NEUTROS ABS: 1.8 10*3/uL (ref 1.4–7.0)
Neutrophils: 40 %
PLATELETS: 164 10*3/uL (ref 150–379)
RBC: 3.98 x10E6/uL — AB (ref 4.14–5.80)
RDW: 14.4 % (ref 12.3–15.4)
WBC: 4.4 10*3/uL (ref 3.4–10.8)

## 2016-02-23 LAB — LIPID PANEL
CHOLESTEROL TOTAL: 103 mg/dL (ref 100–199)
Chol/HDL Ratio: 2.5 ratio units (ref 0.0–5.0)
HDL: 42 mg/dL (ref >39)
LDL CALC: 54 mg/dL (ref 0–99)
TRIGLYCERIDES: 37 mg/dL (ref 0–149)
VLDL CHOLESTEROL CAL: 7 mg/dL (ref 5–40)

## 2016-02-23 LAB — BMP8+EGFR
BUN / CREAT RATIO: 17 (ref 10–24)
BUN: 16 mg/dL (ref 8–27)
CALCIUM: 8.7 mg/dL (ref 8.6–10.2)
CHLORIDE: 101 mmol/L (ref 96–106)
CO2: 29 mmol/L (ref 18–29)
Creatinine, Ser: 0.96 mg/dL (ref 0.76–1.27)
GFR calc non Af Amer: 79 mL/min/{1.73_m2} (ref >59)
GFR, EST AFRICAN AMERICAN: 91 mL/min/{1.73_m2} (ref >59)
GLUCOSE: 73 mg/dL (ref 65–99)
POTASSIUM: 4.2 mmol/L (ref 3.5–5.2)
Sodium: 140 mmol/L (ref 134–144)

## 2016-02-23 LAB — VITAMIN D 25 HYDROXY (VIT D DEFICIENCY, FRACTURES): VIT D 25 HYDROXY: 25.2 ng/mL — AB (ref 30.0–100.0)

## 2016-03-07 ENCOUNTER — Other Ambulatory Visit: Payer: Self-pay | Admitting: *Deleted

## 2016-03-07 MED ORDER — VITAMIN D (ERGOCALCIFEROL) 1.25 MG (50000 UNIT) PO CAPS: 50000.0000 [IU] | ORAL_CAPSULE | ORAL | 3 refills | Status: AC

## 2016-03-10 LAB — POCT INR: INR: 2.9

## 2016-03-21 ENCOUNTER — Ambulatory Visit (INDEPENDENT_AMBULATORY_CARE_PROVIDER_SITE_OTHER): Payer: Self-pay | Admitting: Pharmacist

## 2016-03-21 ENCOUNTER — Other Ambulatory Visit: Payer: Self-pay | Admitting: Family Medicine

## 2016-03-21 ENCOUNTER — Telehealth: Payer: Self-pay | Admitting: Family Medicine

## 2016-03-27 NOTE — Telephone Encounter (Signed)
meds filled

## 2016-03-29 LAB — POCT INR: INR: 2.2

## 2016-03-31 ENCOUNTER — Ambulatory Visit (INDEPENDENT_AMBULATORY_CARE_PROVIDER_SITE_OTHER): Payer: Self-pay | Admitting: Pharmacist

## 2016-04-07 LAB — POCT INR: INR: 2.2

## 2016-04-11 ENCOUNTER — Ambulatory Visit (INDEPENDENT_AMBULATORY_CARE_PROVIDER_SITE_OTHER): Payer: Self-pay | Admitting: Pharmacist

## 2016-04-11 ENCOUNTER — Ambulatory Visit: Payer: Medicare Other | Admitting: Family Medicine

## 2016-04-14 ENCOUNTER — Encounter: Payer: Self-pay | Admitting: Family Medicine

## 2016-04-14 ENCOUNTER — Ambulatory Visit (INDEPENDENT_AMBULATORY_CARE_PROVIDER_SITE_OTHER): Payer: Medicare Other | Admitting: Family Medicine

## 2016-04-14 VITALS — BP 133/82 | HR 76 | Temp 97.5°F

## 2016-04-14 DIAGNOSIS — J40 Bronchitis, not specified as acute or chronic: Secondary | ICD-10-CM

## 2016-04-14 DIAGNOSIS — I480 Paroxysmal atrial fibrillation: Secondary | ICD-10-CM | POA: Diagnosis not present

## 2016-04-14 MED ORDER — FLUTICASONE PROPIONATE 50 MCG/ACT NA SUSP: 1.0000 | Freq: Two times a day (BID) | NASAL | 6 refills | Status: AC | PRN

## 2016-04-14 MED ORDER — CEFDINIR 300 MG PO CAPS: 300.0000 mg | ORAL_CAPSULE | Freq: Two times a day (BID) | ORAL | 0 refills | Status: DC

## 2016-04-14 NOTE — Progress Notes (Signed)
BP 133/82   Pulse 76   Temp 97.5 F (36.4 C) (Oral)    Subjective:    Patient ID: Omar [name], male    DOB: 10/04/1945, 71 y.o.   MRN: [id number]  HPI: Omar [name] is a 71 y.o. male presenting on 04/14/2016 for Sinusitis (since Monday) and Cough (chest congestion, productive, night time is worse. no known fever, not achey)   HPI Cough and congestion and sinus pressure Patient comes in today complaining of 4 days worth of cough and congestion sinus pressure. He denies any fevers or being short of breath more than he is at baseline. He denies any myalgias or wheezing. His cough has been productive of yellow-green sputum. He has not tried anything over-the-counter yet because he did not have anything. He denies any sick contacts that he knows of. He says he usually gets like this a couple times a year at least. He says relatively it's mild form but he was just concerned because he started coughing up green phlegm.  Relevant past medical, surgical, family and social history reviewed and updated as indicated. Interim medical history since our last visit reviewed. Allergies and medications reviewed and updated.  Review of Systems  Constitutional: Negative for chills and fever.  HENT: Positive for congestion, postnasal drip, rhinorrhea, sinus pressure, sneezing and sore throat. Negative for ear discharge, ear pain and voice change.   Eyes: Negative for pain, discharge, redness and visual disturbance.  Respiratory: Positive for cough. Negative for shortness of breath and wheezing.   Cardiovascular: Negative for chest pain and leg swelling.  Musculoskeletal: Negative for gait problem.  Skin: Negative for rash.  All other systems reviewed and are negative.   Per HPI unless specifically indicated above      Objective:    BP 133/82   Pulse 76   Temp 97.5 F (36.4 C) (Oral)   Wt Readings from Last 3 Encounters:  03/10/09 (!) 350 lb (158.8 kg)    Physical Exam  Constitutional:  He is oriented to person, place, and time. He appears well-developed and well-nourished. No distress.  HENT:  Right Ear: Tympanic membrane, external ear and ear canal normal.  Left Ear: Tympanic membrane, external ear and ear canal normal.  Nose: Mucosal edema and rhinorrhea present. No sinus tenderness. No epistaxis. Right sinus exhibits maxillary sinus tenderness. Right sinus exhibits no frontal sinus tenderness. Left sinus exhibits maxillary sinus tenderness. Left sinus exhibits no frontal sinus tenderness.  Mouth/Throat: Uvula is midline and mucous membranes are normal. Posterior oropharyngeal edema and posterior oropharyngeal erythema present. No oropharyngeal exudate or tonsillar abscesses.  Eyes: Conjunctivae are normal. Right eye exhibits no discharge. Left eye exhibits no discharge. No scleral icterus.  Neck: Neck supple. No thyromegaly present.  Cardiovascular: Normal rate, regular rhythm, normal heart sounds and intact distal pulses.   No murmur heard. Pulmonary/Chest: Effort normal. No respiratory distress. He has no wheezes. He has rhonchi in the right upper field and the left upper field. He has rales (Bibasilar crackles but patient says this is his normal breathing).  Musculoskeletal: Normal range of motion. He exhibits no edema.  Lymphadenopathy:    He has no cervical adenopathy.  Neurological: He is alert and oriented to person, place, and time. Coordination normal.  Skin: Skin is warm and dry. No rash noted. He is not diaphoretic.  Psychiatric: He has a normal mood and affect. His behavior is normal.  Nursing note and vitals reviewed.     Assessment & Plan:  Problem List Items Addressed This Visit    None    Visit Diagnoses    Bronchitis    -  Primary   Relevant Medications   cefdinir (OMNICEF) 300 MG capsule   fluticasone (FLONASE) 50 MCG/ACT nasal spray       Follow up plan: Return if symptoms worsen or fail to improve.  Counseling provided for all of the  vaccine components No orders of the defined types were placed in this encounter.   Arville CareJoshua Corsica Franson, MD [facility] 04/14/2016, 4:49 PM

## 2016-04-16 ENCOUNTER — Other Ambulatory Visit: Payer: Self-pay | Admitting: Family Medicine

## 2016-04-17 ENCOUNTER — Other Ambulatory Visit: Payer: Self-pay | Admitting: Family Medicine

## 2016-04-22 ENCOUNTER — Other Ambulatory Visit: Payer: Self-pay | Admitting: Family Medicine

## 2016-04-28 DIAGNOSIS — I480 Paroxysmal atrial fibrillation: Secondary | ICD-10-CM | POA: Diagnosis not present

## 2016-04-28 LAB — POCT INR: INR: 1.9

## 2016-05-01 ENCOUNTER — Telehealth: Payer: Self-pay | Admitting: Family Medicine

## 2016-05-01 NOTE — Telephone Encounter (Signed)
Covering for PCP.   Recommend Office eval.   Murtis SinkSam Bradshaw, MD [facility] 05/01/2016, 10:13 AM

## 2016-05-01 NOTE — Telephone Encounter (Signed)
Pt is waking up hot, clammy & sweaty several times, he gets up into his wheelchair talks with family and he is fine. Daughter believes this is some anxiety from his wife Lupita LeashDonna passing away a couple months ago and him not being able to get outside as much right now.

## 2016-05-03 ENCOUNTER — Ambulatory Visit (INDEPENDENT_AMBULATORY_CARE_PROVIDER_SITE_OTHER): Payer: Self-pay | Admitting: Pharmacist

## 2016-05-03 NOTE — Progress Notes (Signed)
No charge for labs or visit - INR check through home monitoring system  

## 2016-05-05 ENCOUNTER — Ambulatory Visit (INDEPENDENT_AMBULATORY_CARE_PROVIDER_SITE_OTHER): Payer: Medicare Other

## 2016-05-05 ENCOUNTER — Encounter: Payer: Self-pay | Admitting: Pediatrics

## 2016-05-05 ENCOUNTER — Ambulatory Visit (INDEPENDENT_AMBULATORY_CARE_PROVIDER_SITE_OTHER): Payer: Medicare Other | Admitting: Pediatrics

## 2016-05-05 VITALS — BP 142/93 | HR 72 | Temp 97.4°F | Resp 16 | Ht 67.0 in

## 2016-05-05 DIAGNOSIS — R0902 Hypoxemia: Secondary | ICD-10-CM

## 2016-05-05 DIAGNOSIS — R0600 Dyspnea, unspecified: Secondary | ICD-10-CM

## 2016-05-05 MED ORDER — FUROSEMIDE 20 MG PO TABS: 20.0000 mg | ORAL_TABLET | Freq: Every day | ORAL | 3 refills | Status: AC

## 2016-05-05 NOTE — Progress Notes (Addendum)
  Subjective:   Patient ID: Omar [name], male    DOB: [birth year], 71 y.o.   MRN: [id number] CC: Cough and Chest congestion  HPI: Omar [name] is a 71 y.o. male presenting for Cough and Chest congestion  Finished antibiotics from recent bronchitis diagnosis Feeling much better Now having some SOB when he lies down flat Swelling in LE is at baseline R sided stroke, wheelchair bound now Coughing some still, non-productive No fevers No runny nose or sinus congestion Feeling well other than the cough Normal appetite, eating well No change in swelling in LE, R leg has been more swollen since cva  Relevant past medical, surgical, family and social history reviewed. Allergies and medications reviewed and updated. History  Smoking Status  . Former Smoker  Smokeless Tobacco  . Never Used    Comment: quit 12 + years ago    ROS: Per HPI   Objective:    BP (!) 142/93   Pulse 72   Temp 97.4 F (36.3 C) (Oral)   Resp 16   Ht '5\' 7"'$  (1.702 m)   SpO2 90%   Wt Readings from Last 3 Encounters:  03/10/09 (!) 350 lb (158.8 kg)    Gen: NAD, alert, cooperative with exam, NCAT, obese, wheelchair bound EYES: EOMI, no conjunctival injection, or no icterus ENT:  TMs pearly gray b/l, OP without erythema LYMPH: no cervical LAD Neck: neck veins distended  b/l CV: NRRR, normal S1/S2, no murmur, distal pulses 2+ b/l Resp: CTABL, no wheezes, no crackles, speaking in long phrases, comfortable WOB Abd: +BS, soft, NTND.  Ext: significant non-pitting edema present b/l LE, R > L Neuro: Alert and oriented  Assessment & Plan:  Omar [name] was seen today for cough and chest congestion.  Diagnoses and all orders for this visit:  Hypoxia Oxygen level 90% No recent comparisons available in chart review +orthopnea CXR AP due to poor mobility, some b/l haziness No fevers, feeling well, orthopnea bothering him the most Recently finished course of cefdinir Volume overloaded on exam Will check labs,  get ECHO, start furosemide Pt not able to weigh self daily with R sided hemiplegia from cva RTC in 1 week for recheck Any worsening in breathing needs to be seen -     DG Chest 1 View; Future  Dyspnea, unspecified type -     ECHOCARDIOGRAM COMPLETE; Future -     CBC with Differential/Platelet -     BMP8+EGFR -     Brain natriuretic peptide  Other orders -     furosemide (LASIX) 20 MG tablet; Take 1 tablet (20 mg total) by mouth daily.   Follow up plan: 1 week Assunta Found, MD [place]

## 2016-05-07 LAB — CBC WITH DIFFERENTIAL/PLATELET
BASOS: 0 %
Basophils Absolute: 0 10*3/uL (ref 0.0–0.2)
EOS (ABSOLUTE): 0.2 10*3/uL (ref 0.0–0.4)
EOS: 4 %
HEMATOCRIT: 36.4 % — AB (ref 37.5–51.0)
HEMOGLOBIN: 11.5 g/dL — AB (ref 13.0–17.7)
Immature Grans (Abs): 0 10*3/uL (ref 0.0–0.1)
Immature Granulocytes: 0 %
Lymphocytes Absolute: 0.8 10*3/uL (ref 0.7–3.1)
Lymphs: 18 %
MCH: 31.6 pg (ref 26.6–33.0)
MCHC: 31.6 g/dL (ref 31.5–35.7)
MCV: 100 fL — AB (ref 79–97)
Monocytes Absolute: 0.6 10*3/uL (ref 0.1–0.9)
Monocytes: 14 %
NEUTROS ABS: 2.8 10*3/uL (ref 1.4–7.0)
Neutrophils: 64 %
PLATELETS: 146 10*3/uL — AB (ref 150–379)
RBC: 3.64 x10E6/uL — ABNORMAL LOW (ref 4.14–5.80)
RDW: 15 % (ref 12.3–15.4)
WBC: 4.4 10*3/uL (ref 3.4–10.8)

## 2016-05-07 LAB — BMP8+EGFR
BUN / CREAT RATIO: 18 (ref 10–24)
BUN: 15 mg/dL (ref 8–27)
CO2: 24 mmol/L (ref 18–29)
CREATININE: 0.82 mg/dL (ref 0.76–1.27)
Calcium: 8.9 mg/dL (ref 8.6–10.2)
Chloride: 104 mmol/L (ref 96–106)
GFR, EST AFRICAN AMERICAN: 102 mL/min/{1.73_m2} (ref >59)
GFR, EST NON AFRICAN AMERICAN: 88 mL/min/{1.73_m2} (ref >59)
Glucose: 100 mg/dL — ABNORMAL HIGH (ref 65–99)
Potassium: 4.4 mmol/L (ref 3.5–5.2)
Sodium: 143 mmol/L (ref 134–144)

## 2016-05-08 ENCOUNTER — Emergency Department (HOSPITAL_COMMUNITY): Payer: Medicare Other

## 2016-05-08 ENCOUNTER — Inpatient Hospital Stay (HOSPITAL_COMMUNITY)
Admission: EM | Admit: 2016-05-08 | Discharge: 2016-07-04 | DRG: 004 | Disposition: E | Payer: Medicare Other | Attending: Internal Medicine | Admitting: Internal Medicine

## 2016-05-08 ENCOUNTER — Encounter (HOSPITAL_COMMUNITY): Payer: Self-pay | Admitting: Emergency Medicine

## 2016-05-08 DIAGNOSIS — R0902 Hypoxemia: Secondary | ICD-10-CM | POA: Diagnosis not present

## 2016-05-08 DIAGNOSIS — Z7901 Long term (current) use of anticoagulants: Secondary | ICD-10-CM

## 2016-05-08 DIAGNOSIS — J14 Pneumonia due to Hemophilus influenzae: Secondary | ICD-10-CM | POA: Diagnosis not present

## 2016-05-08 DIAGNOSIS — I509 Heart failure, unspecified: Secondary | ICD-10-CM

## 2016-05-08 DIAGNOSIS — D696 Thrombocytopenia, unspecified: Secondary | ICD-10-CM | POA: Diagnosis present

## 2016-05-08 DIAGNOSIS — I69351 Hemiplegia and hemiparesis following cerebral infarction affecting right dominant side: Secondary | ICD-10-CM

## 2016-05-08 DIAGNOSIS — E662 Morbid (severe) obesity with alveolar hypoventilation: Secondary | ICD-10-CM | POA: Diagnosis present

## 2016-05-08 DIAGNOSIS — D62 Acute posthemorrhagic anemia: Secondary | ICD-10-CM | POA: Diagnosis not present

## 2016-05-08 DIAGNOSIS — Z8249 Family history of ischemic heart disease and other diseases of the circulatory system: Secondary | ICD-10-CM

## 2016-05-08 DIAGNOSIS — Z79899 Other long term (current) drug therapy: Secondary | ICD-10-CM

## 2016-05-08 DIAGNOSIS — L89109 Pressure ulcer of unspecified part of back, unspecified stage: Secondary | ICD-10-CM | POA: Diagnosis present

## 2016-05-08 DIAGNOSIS — K661 Hemoperitoneum: Secondary | ICD-10-CM | POA: Diagnosis present

## 2016-05-08 DIAGNOSIS — K8020 Calculus of gallbladder without cholecystitis without obstruction: Secondary | ICD-10-CM | POA: Diagnosis not present

## 2016-05-08 DIAGNOSIS — Z6841 Body Mass Index (BMI) 40.0 and over, adult: Secondary | ICD-10-CM | POA: Diagnosis not present

## 2016-05-08 DIAGNOSIS — Z515 Encounter for palliative care: Secondary | ICD-10-CM | POA: Diagnosis not present

## 2016-05-08 DIAGNOSIS — N5089 Other specified disorders of the male genital organs: Secondary | ICD-10-CM | POA: Diagnosis not present

## 2016-05-08 DIAGNOSIS — I5033 Acute on chronic diastolic (congestive) heart failure: Secondary | ICD-10-CM | POA: Diagnosis present

## 2016-05-08 DIAGNOSIS — R7881 Bacteremia: Secondary | ICD-10-CM | POA: Diagnosis not present

## 2016-05-08 DIAGNOSIS — R10819 Abdominal tenderness, unspecified site: Secondary | ICD-10-CM | POA: Diagnosis not present

## 2016-05-08 DIAGNOSIS — I6789 Other cerebrovascular disease: Secondary | ICD-10-CM | POA: Diagnosis not present

## 2016-05-08 DIAGNOSIS — R6521 Severe sepsis with septic shock: Secondary | ICD-10-CM | POA: Diagnosis not present

## 2016-05-08 DIAGNOSIS — R58 Hemorrhage, not elsewhere classified: Secondary | ICD-10-CM

## 2016-05-08 DIAGNOSIS — A419 Sepsis, unspecified organism: Secondary | ICD-10-CM | POA: Diagnosis not present

## 2016-05-08 DIAGNOSIS — Z992 Dependence on renal dialysis: Secondary | ICD-10-CM

## 2016-05-08 DIAGNOSIS — N179 Acute kidney failure, unspecified: Secondary | ICD-10-CM | POA: Diagnosis not present

## 2016-05-08 DIAGNOSIS — N186 End stage renal disease: Secondary | ICD-10-CM | POA: Diagnosis present

## 2016-05-08 DIAGNOSIS — E46 Unspecified protein-calorie malnutrition: Secondary | ICD-10-CM | POA: Diagnosis not present

## 2016-05-08 DIAGNOSIS — J9602 Acute respiratory failure with hypercapnia: Secondary | ICD-10-CM | POA: Diagnosis not present

## 2016-05-08 DIAGNOSIS — E871 Hypo-osmolality and hyponatremia: Secondary | ICD-10-CM | POA: Diagnosis not present

## 2016-05-08 DIAGNOSIS — Z66 Do not resuscitate: Secondary | ICD-10-CM | POA: Diagnosis present

## 2016-05-08 DIAGNOSIS — E8770 Fluid overload, unspecified: Secondary | ICD-10-CM | POA: Diagnosis present

## 2016-05-08 DIAGNOSIS — R34 Anuria and oliguria: Secondary | ICD-10-CM | POA: Diagnosis not present

## 2016-05-08 DIAGNOSIS — D649 Anemia, unspecified: Secondary | ICD-10-CM | POA: Diagnosis not present

## 2016-05-08 DIAGNOSIS — M545 Low back pain: Secondary | ICD-10-CM | POA: Diagnosis not present

## 2016-05-08 DIAGNOSIS — J9690 Respiratory failure, unspecified, unspecified whether with hypoxia or hypercapnia: Secondary | ICD-10-CM | POA: Diagnosis not present

## 2016-05-08 DIAGNOSIS — T68XXXA Hypothermia, initial encounter: Secondary | ICD-10-CM | POA: Diagnosis not present

## 2016-05-08 DIAGNOSIS — M79603 Pain in arm, unspecified: Secondary | ICD-10-CM

## 2016-05-08 DIAGNOSIS — J9600 Acute respiratory failure, unspecified whether with hypoxia or hypercapnia: Secondary | ICD-10-CM | POA: Diagnosis not present

## 2016-05-08 DIAGNOSIS — E2740 Unspecified adrenocortical insufficiency: Secondary | ICD-10-CM | POA: Diagnosis not present

## 2016-05-08 DIAGNOSIS — R509 Fever, unspecified: Secondary | ICD-10-CM

## 2016-05-08 DIAGNOSIS — J9601 Acute respiratory failure with hypoxia: Secondary | ICD-10-CM | POA: Diagnosis not present

## 2016-05-08 DIAGNOSIS — I480 Paroxysmal atrial fibrillation: Secondary | ICD-10-CM | POA: Diagnosis not present

## 2016-05-08 DIAGNOSIS — J9621 Acute and chronic respiratory failure with hypoxia: Secondary | ICD-10-CM | POA: Diagnosis not present

## 2016-05-08 DIAGNOSIS — Z430 Encounter for attention to tracheostomy: Secondary | ICD-10-CM | POA: Diagnosis not present

## 2016-05-08 DIAGNOSIS — R404 Transient alteration of awareness: Secondary | ICD-10-CM | POA: Diagnosis not present

## 2016-05-08 DIAGNOSIS — R4182 Altered mental status, unspecified: Secondary | ICD-10-CM | POA: Diagnosis present

## 2016-05-08 DIAGNOSIS — Z452 Encounter for adjustment and management of vascular access device: Secondary | ICD-10-CM | POA: Diagnosis not present

## 2016-05-08 DIAGNOSIS — R1310 Dysphagia, unspecified: Secondary | ICD-10-CM | POA: Diagnosis not present

## 2016-05-08 DIAGNOSIS — Z4901 Encounter for fitting and adjustment of extracorporeal dialysis catheter: Secondary | ICD-10-CM | POA: Diagnosis not present

## 2016-05-08 DIAGNOSIS — Z789 Other specified health status: Secondary | ICD-10-CM

## 2016-05-08 DIAGNOSIS — Z4659 Encounter for fitting and adjustment of other gastrointestinal appliance and device: Secondary | ICD-10-CM

## 2016-05-08 DIAGNOSIS — Z5309 Procedure and treatment not carried out because of other contraindication: Secondary | ICD-10-CM

## 2016-05-08 DIAGNOSIS — E872 Acidosis: Secondary | ICD-10-CM | POA: Diagnosis not present

## 2016-05-08 DIAGNOSIS — M1990 Unspecified osteoarthritis, unspecified site: Secondary | ICD-10-CM | POA: Diagnosis present

## 2016-05-08 DIAGNOSIS — R4020 Unspecified coma: Secondary | ICD-10-CM | POA: Diagnosis not present

## 2016-05-08 DIAGNOSIS — J811 Chronic pulmonary edema: Secondary | ICD-10-CM | POA: Diagnosis present

## 2016-05-08 DIAGNOSIS — K7200 Acute and subacute hepatic failure without coma: Secondary | ICD-10-CM | POA: Diagnosis not present

## 2016-05-08 DIAGNOSIS — E039 Hypothyroidism, unspecified: Secondary | ICD-10-CM | POA: Diagnosis present

## 2016-05-08 DIAGNOSIS — R0602 Shortness of breath: Secondary | ICD-10-CM | POA: Diagnosis not present

## 2016-05-08 DIAGNOSIS — J690 Pneumonitis due to inhalation of food and vomit: Secondary | ICD-10-CM | POA: Diagnosis not present

## 2016-05-08 DIAGNOSIS — J189 Pneumonia, unspecified organism: Secondary | ICD-10-CM | POA: Diagnosis not present

## 2016-05-08 DIAGNOSIS — R29810 Facial weakness: Secondary | ICD-10-CM | POA: Diagnosis not present

## 2016-05-08 DIAGNOSIS — E875 Hyperkalemia: Secondary | ICD-10-CM | POA: Diagnosis not present

## 2016-05-08 DIAGNOSIS — E1165 Type 2 diabetes mellitus with hyperglycemia: Secondary | ICD-10-CM | POA: Diagnosis present

## 2016-05-08 DIAGNOSIS — B029 Zoster without complications: Secondary | ICD-10-CM

## 2016-05-08 DIAGNOSIS — D539 Nutritional anemia, unspecified: Secondary | ICD-10-CM | POA: Diagnosis not present

## 2016-05-08 DIAGNOSIS — R570 Cardiogenic shock: Secondary | ICD-10-CM | POA: Diagnosis present

## 2016-05-08 DIAGNOSIS — I482 Chronic atrial fibrillation: Secondary | ICD-10-CM | POA: Diagnosis not present

## 2016-05-08 DIAGNOSIS — I481 Persistent atrial fibrillation: Secondary | ICD-10-CM | POA: Diagnosis not present

## 2016-05-08 DIAGNOSIS — Z888 Allergy status to other drugs, medicaments and biological substances status: Secondary | ICD-10-CM | POA: Diagnosis not present

## 2016-05-08 DIAGNOSIS — M79601 Pain in right arm: Secondary | ICD-10-CM | POA: Diagnosis not present

## 2016-05-08 DIAGNOSIS — J9620 Acute and chronic respiratory failure, unspecified whether with hypoxia or hypercapnia: Secondary | ICD-10-CM | POA: Diagnosis not present

## 2016-05-08 DIAGNOSIS — S42301A Unspecified fracture of shaft of humerus, right arm, initial encounter for closed fracture: Secondary | ICD-10-CM | POA: Diagnosis not present

## 2016-05-08 DIAGNOSIS — I959 Hypotension, unspecified: Secondary | ICD-10-CM | POA: Diagnosis not present

## 2016-05-08 DIAGNOSIS — Z993 Dependence on wheelchair: Secondary | ICD-10-CM

## 2016-05-08 DIAGNOSIS — M109 Gout, unspecified: Secondary | ICD-10-CM | POA: Diagnosis present

## 2016-05-08 DIAGNOSIS — S42201A Unspecified fracture of upper end of right humerus, initial encounter for closed fracture: Secondary | ICD-10-CM | POA: Diagnosis present

## 2016-05-08 DIAGNOSIS — A498 Other bacterial infections of unspecified site: Secondary | ICD-10-CM | POA: Diagnosis not present

## 2016-05-08 DIAGNOSIS — Z0389 Encounter for observation for other suspected diseases and conditions ruled out: Secondary | ICD-10-CM | POA: Diagnosis not present

## 2016-05-08 DIAGNOSIS — E785 Hyperlipidemia, unspecified: Secondary | ICD-10-CM | POA: Diagnosis present

## 2016-05-08 DIAGNOSIS — K567 Ileus, unspecified: Secondary | ICD-10-CM | POA: Diagnosis not present

## 2016-05-08 DIAGNOSIS — E878 Other disorders of electrolyte and fluid balance, not elsewhere classified: Secondary | ICD-10-CM

## 2016-05-08 DIAGNOSIS — I517 Cardiomegaly: Secondary | ICD-10-CM | POA: Diagnosis not present

## 2016-05-08 DIAGNOSIS — K7689 Other specified diseases of liver: Secondary | ICD-10-CM | POA: Diagnosis not present

## 2016-05-08 DIAGNOSIS — Z87891 Personal history of nicotine dependence: Secondary | ICD-10-CM

## 2016-05-08 DIAGNOSIS — D709 Neutropenia, unspecified: Secondary | ICD-10-CM | POA: Diagnosis not present

## 2016-05-08 DIAGNOSIS — E861 Hypovolemia: Secondary | ICD-10-CM | POA: Diagnosis not present

## 2016-05-08 DIAGNOSIS — R197 Diarrhea, unspecified: Secondary | ICD-10-CM | POA: Diagnosis not present

## 2016-05-08 DIAGNOSIS — G9341 Metabolic encephalopathy: Secondary | ICD-10-CM | POA: Diagnosis present

## 2016-05-08 DIAGNOSIS — N170 Acute kidney failure with tubular necrosis: Secondary | ICD-10-CM | POA: Diagnosis not present

## 2016-05-08 DIAGNOSIS — J9611 Chronic respiratory failure with hypoxia: Secondary | ICD-10-CM | POA: Diagnosis not present

## 2016-05-08 DIAGNOSIS — S42209A Unspecified fracture of upper end of unspecified humerus, initial encounter for closed fracture: Secondary | ICD-10-CM

## 2016-05-08 DIAGNOSIS — K121 Other forms of stomatitis: Secondary | ICD-10-CM | POA: Diagnosis not present

## 2016-05-08 DIAGNOSIS — Z9911 Dependence on respirator [ventilator] status: Secondary | ICD-10-CM

## 2016-05-08 DIAGNOSIS — J9692 Respiratory failure, unspecified with hypercapnia: Secondary | ICD-10-CM | POA: Diagnosis not present

## 2016-05-08 DIAGNOSIS — Z886 Allergy status to analgesic agent status: Secondary | ICD-10-CM

## 2016-05-08 DIAGNOSIS — Z781 Physical restraint status: Secondary | ICD-10-CM

## 2016-05-08 DIAGNOSIS — Z930 Tracheostomy status: Secondary | ICD-10-CM | POA: Diagnosis not present

## 2016-05-08 DIAGNOSIS — G4730 Sleep apnea, unspecified: Secondary | ICD-10-CM | POA: Diagnosis present

## 2016-05-08 DIAGNOSIS — D509 Iron deficiency anemia, unspecified: Secondary | ICD-10-CM | POA: Diagnosis not present

## 2016-05-08 DIAGNOSIS — E034 Atrophy of thyroid (acquired): Secondary | ICD-10-CM | POA: Diagnosis not present

## 2016-05-08 DIAGNOSIS — Y95 Nosocomial condition: Secondary | ICD-10-CM | POA: Diagnosis not present

## 2016-05-08 DIAGNOSIS — J9622 Acute and chronic respiratory failure with hypercapnia: Secondary | ICD-10-CM | POA: Diagnosis present

## 2016-05-08 DIAGNOSIS — I4891 Unspecified atrial fibrillation: Secondary | ICD-10-CM | POA: Diagnosis not present

## 2016-05-08 DIAGNOSIS — Z9289 Personal history of other medical treatment: Secondary | ICD-10-CM

## 2016-05-08 DIAGNOSIS — R718 Other abnormality of red blood cells: Secondary | ICD-10-CM | POA: Diagnosis not present

## 2016-05-08 DIAGNOSIS — R531 Weakness: Secondary | ICD-10-CM | POA: Diagnosis not present

## 2016-05-08 DIAGNOSIS — R579 Shock, unspecified: Secondary | ICD-10-CM

## 2016-05-08 DIAGNOSIS — K769 Liver disease, unspecified: Secondary | ICD-10-CM

## 2016-05-08 DIAGNOSIS — I361 Nonrheumatic tricuspid (valve) insufficiency: Secondary | ICD-10-CM | POA: Diagnosis not present

## 2016-05-08 DIAGNOSIS — I132 Hypertensive heart and chronic kidney disease with heart failure and with stage 5 chronic kidney disease, or end stage renal disease: Secondary | ICD-10-CM | POA: Diagnosis present

## 2016-05-08 DIAGNOSIS — I953 Hypotension of hemodialysis: Secondary | ICD-10-CM | POA: Diagnosis not present

## 2016-05-08 DIAGNOSIS — E1122 Type 2 diabetes mellitus with diabetic chronic kidney disease: Secondary | ICD-10-CM | POA: Diagnosis present

## 2016-05-08 DIAGNOSIS — R918 Other nonspecific abnormal finding of lung field: Secondary | ICD-10-CM | POA: Diagnosis not present

## 2016-05-08 DIAGNOSIS — Z87448 Personal history of other diseases of urinary system: Secondary | ICD-10-CM | POA: Diagnosis not present

## 2016-05-08 DIAGNOSIS — E559 Vitamin D deficiency, unspecified: Secondary | ICD-10-CM | POA: Diagnosis present

## 2016-05-08 DIAGNOSIS — J45909 Unspecified asthma, uncomplicated: Secondary | ICD-10-CM | POA: Diagnosis present

## 2016-05-08 DIAGNOSIS — N189 Chronic kidney disease, unspecified: Secondary | ICD-10-CM | POA: Diagnosis not present

## 2016-05-08 DIAGNOSIS — Z978 Presence of other specified devices: Secondary | ICD-10-CM

## 2016-05-08 DIAGNOSIS — R410 Disorientation, unspecified: Secondary | ICD-10-CM | POA: Diagnosis not present

## 2016-05-08 DIAGNOSIS — L8990 Pressure ulcer of unspecified site, unspecified stage: Secondary | ICD-10-CM | POA: Insufficient documentation

## 2016-05-08 DIAGNOSIS — E876 Hypokalemia: Secondary | ICD-10-CM | POA: Diagnosis not present

## 2016-05-08 DIAGNOSIS — J9612 Chronic respiratory failure with hypercapnia: Secondary | ICD-10-CM | POA: Diagnosis not present

## 2016-05-08 DIAGNOSIS — Z7189 Other specified counseling: Secondary | ICD-10-CM

## 2016-05-08 DIAGNOSIS — R109 Unspecified abdominal pain: Secondary | ICD-10-CM

## 2016-05-08 DIAGNOSIS — L03311 Cellulitis of abdominal wall: Secondary | ICD-10-CM | POA: Diagnosis not present

## 2016-05-08 DIAGNOSIS — J90 Pleural effusion, not elsewhere classified: Secondary | ICD-10-CM

## 2016-05-08 DIAGNOSIS — D6489 Other specified anemias: Secondary | ICD-10-CM | POA: Diagnosis present

## 2016-05-08 DIAGNOSIS — T45515A Adverse effect of anticoagulants, initial encounter: Secondary | ICD-10-CM | POA: Diagnosis not present

## 2016-05-08 DIAGNOSIS — R578 Other shock: Secondary | ICD-10-CM | POA: Diagnosis not present

## 2016-05-08 DIAGNOSIS — R451 Restlessness and agitation: Secondary | ICD-10-CM | POA: Diagnosis not present

## 2016-05-08 DIAGNOSIS — J810 Acute pulmonary edema: Secondary | ICD-10-CM | POA: Diagnosis not present

## 2016-05-08 DIAGNOSIS — J95851 Ventilator associated pneumonia: Secondary | ICD-10-CM | POA: Diagnosis not present

## 2016-05-08 DIAGNOSIS — D631 Anemia in chronic kidney disease: Secondary | ICD-10-CM | POA: Diagnosis present

## 2016-05-08 DIAGNOSIS — Z4682 Encounter for fitting and adjustment of non-vascular catheter: Secondary | ICD-10-CM | POA: Diagnosis not present

## 2016-05-08 DIAGNOSIS — E042 Nontoxic multinodular goiter: Secondary | ICD-10-CM | POA: Diagnosis present

## 2016-05-08 HISTORY — DX: Gout, unspecified: M10.9

## 2016-05-08 HISTORY — DX: Thrombocytopenia, unspecified: D69.6

## 2016-05-08 HISTORY — DX: Cerebral infarction, unspecified: I63.9

## 2016-05-08 LAB — COMPREHENSIVE METABOLIC PANEL
ALBUMIN: 3.3 g/dL — AB (ref 3.5–5.0)
ALK PHOS: 71 U/L (ref 38–126)
ALT: 13 U/L — AB (ref 17–63)
ANION GAP: 2 — AB (ref 5–15)
AST: 20 U/L (ref 15–41)
BILIRUBIN TOTAL: 0.9 mg/dL (ref 0.3–1.2)
BUN: 23 mg/dL — ABNORMAL HIGH (ref 6–20)
CO2: 31 mmol/L (ref 22–32)
Calcium: 8.4 mg/dL — ABNORMAL LOW (ref 8.9–10.3)
Chloride: 105 mmol/L (ref 101–111)
Creatinine, Ser: 1.19 mg/dL (ref 0.61–1.24)
GFR calc non Af Amer: 59 mL/min — ABNORMAL LOW (ref >60)
Glucose, Bld: 109 mg/dL — ABNORMAL HIGH (ref 65–99)
POTASSIUM: 4.2 mmol/L (ref 3.5–5.1)
SODIUM: 138 mmol/L (ref 135–145)
Total Protein: 8.5 g/dL — ABNORMAL HIGH (ref 6.5–8.1)

## 2016-05-08 LAB — BLOOD GAS, ARTERIAL
Acid-Base Excess: 2.9 mmol/L — ABNORMAL HIGH (ref 0.0–2.0)
Acid-Base Excess: 3 mmol/L — ABNORMAL HIGH (ref 0.0–2.0)
Acid-Base Excess: 1.9 mmol/L (ref 0.0–2.0)
Acid-Base Excess: 1.1 mmol/L (ref 0.0–2.0)
BICARBONATE: 25.2 mmol/L (ref 20.0–28.0)
BICARBONATE: 24.8 mmol/L (ref 20.0–28.0)
Bicarbonate: 24.7 mmol/L (ref 20.0–28.0)
Bicarbonate: 23.9 mmol/L (ref 20.0–28.0)
DELIVERY SYSTEMS: POSITIVE
DRAWN BY: 234301
Drawn by: 234301
Drawn by: 317771
Drawn by: 317771
EXPIRATORY PAP: 8
FIO2: 50
FIO2: 0.4
FIO2: 0.7
Inspiratory PAP: 22
LHR: 15 {breaths}/min
MECHVT: 540 mL
Mode: POSITIVE
O2 Content: 5 L/min
O2 Content: 40 L/min
O2 Content: 70 L/min
O2 Saturation: 96.9 %
O2 Saturation: 93.2 %
O2 Saturation: 91.2 %
O2 Saturation: 97.7 %
PEEP/CPAP: 5 cmH2O
PEEP: 8 cmH2O
PO2 ART: 72.1 mmHg — AB (ref 83.0–108.0)
RATE: 15 {breaths}/min
VT: 540 mL
pCO2 arterial: 81.8 mmHg (ref 32.0–48.0)
pCO2 arterial: 82.8 mmHg (ref 32.0–48.0)
pCO2 arterial: 67.1 mmHg (ref 32.0–48.0)
pCO2 arterial: 62.8 mmHg — ABNORMAL HIGH (ref 32.0–48.0)
pH, Arterial: 7.196 — CL (ref 7.350–7.450)
pH, Arterial: 7.192 — CL (ref 7.350–7.450)
pH, Arterial: 7.251 — ABNORMAL LOW (ref 7.350–7.450)
pH, Arterial: 7.262 — ABNORMAL LOW (ref 7.350–7.450)
pO2, Arterial: 121 mmHg — ABNORMAL HIGH (ref 83.0–108.0)
pO2, Arterial: 83.9 mmHg (ref 83.0–108.0)
pO2, Arterial: 120 mmHg — ABNORMAL HIGH (ref 83.0–108.0)

## 2016-05-08 LAB — CBC WITH DIFFERENTIAL/PLATELET
BASOS ABS: 0 10*3/uL (ref 0.0–0.1)
BASOS PCT: 0 %
Eosinophils Absolute: 0.1 10*3/uL (ref 0.0–0.7)
Eosinophils Relative: 2 %
HEMATOCRIT: 35.9 % — AB (ref 39.0–52.0)
HEMOGLOBIN: 11.1 g/dL — AB (ref 13.0–17.0)
Lymphocytes Relative: 13 %
Lymphs Abs: 0.5 10*3/uL — ABNORMAL LOW (ref 0.7–4.0)
MCH: 32.8 pg (ref 26.0–34.0)
MCHC: 30.9 g/dL (ref 30.0–36.0)
MCV: 106.2 fL — ABNORMAL HIGH (ref 78.0–100.0)
MONO ABS: 0.6 10*3/uL (ref 0.1–1.0)
Monocytes Relative: 14 %
NEUTROS ABS: 2.8 10*3/uL (ref 1.7–7.7)
NEUTROS PCT: 71 %
Platelets: 137 10*3/uL — ABNORMAL LOW (ref 150–400)
RBC: 3.38 MIL/uL — ABNORMAL LOW (ref 4.22–5.81)
RDW: 15.8 % — AB (ref 11.5–15.5)
WBC: 3.9 10*3/uL — AB (ref 4.0–10.5)

## 2016-05-08 LAB — PROTIME-INR
INR: 2.37
PROTHROMBIN TIME: 26.3 s — AB (ref 11.4–15.2)

## 2016-05-08 LAB — URINALYSIS, ROUTINE W REFLEX MICROSCOPIC
Bilirubin Urine: NEGATIVE
GLUCOSE, UA: NEGATIVE mg/dL
Ketones, ur: NEGATIVE mg/dL
LEUKOCYTES UA: NEGATIVE
NITRITE: NEGATIVE
PROTEIN: 30 mg/dL — AB
Specific Gravity, Urine: 1.014 (ref 1.005–1.030)
pH: 5 (ref 5.0–8.0)

## 2016-05-08 LAB — BRAIN NATRIURETIC PEPTIDE: B NATRIURETIC PEPTIDE 5: 389 pg/mL — AB (ref 0.0–100.0)

## 2016-05-08 LAB — TROPONIN I

## 2016-05-08 LAB — LACTIC ACID, PLASMA: Lactic Acid, Venous: 0.7 mmol/L (ref 0.5–1.9)

## 2016-05-08 MED ORDER — MIDAZOLAM HCL 2 MG/2ML IJ SOLN
INTRAMUSCULAR | Status: AC
  Administered 2016-05-08: 2 mg via INTRAVENOUS
  Filled 2016-05-08: qty 2

## 2016-05-08 MED ORDER — SODIUM CHLORIDE 0.9 % IV BOLUS (SEPSIS)
250.0000 mL | Freq: Once | INTRAVENOUS | Status: AC
  Administered 2016-05-08: 250 mL via INTRAVENOUS

## 2016-05-08 MED ORDER — SODIUM CHLORIDE 0.9 % IV SOLN
0.5000 mg/h | INTRAVENOUS | Status: DC
  Filled 2016-05-08: qty 10

## 2016-05-08 MED ORDER — ETOMIDATE 2 MG/ML IV SOLN
20.0000 mg | Freq: Once | INTRAVENOUS | Status: AC
  Administered 2016-05-08: 20 mg via INTRAVENOUS

## 2016-05-08 MED ORDER — IPRATROPIUM BROMIDE 0.02 % IN SOLN: 1.0000 mg | Freq: Once | RESPIRATORY_TRACT | Status: DC

## 2016-05-08 MED ORDER — SODIUM CHLORIDE 0.9 % IV BOLUS (SEPSIS)
1000.0000 mL | Freq: Once | INTRAVENOUS | Status: AC
  Administered 2016-05-08: 1000 mL via INTRAVENOUS

## 2016-05-08 MED ORDER — SODIUM CHLORIDE 0.9 % IV SOLN
INTRAVENOUS | Status: DC
  Administered 2016-05-08: 400 mL via INTRAVENOUS

## 2016-05-08 MED ORDER — ALBUTEROL (5 MG/ML) CONTINUOUS INHALATION SOLN: 10.0000 mg/h | INHALATION_SOLUTION | Freq: Once | RESPIRATORY_TRACT | Status: DC

## 2016-05-08 MED ORDER — FUROSEMIDE 10 MG/ML IJ SOLN
40.0000 mg | Freq: Once | INTRAMUSCULAR | Status: DC
  Filled 2016-05-08: qty 4

## 2016-05-08 MED ORDER — SODIUM CHLORIDE 0.9 % IV SOLN
10.0000 ug/h | INTRAVENOUS | Status: DC
  Administered 2016-05-08: 25 ug/h via INTRAVENOUS
  Filled 2016-05-08: qty 50

## 2016-05-08 MED ORDER — MIDAZOLAM HCL 50 MG/10ML IJ SOLN
INTRAMUSCULAR | Status: AC
  Filled 2016-05-08: qty 1

## 2016-05-08 MED ORDER — DOPAMINE-DEXTROSE 3.2-5 MG/ML-% IV SOLN
0.0000 ug/kg/min | Freq: Once | INTRAVENOUS | Status: AC
  Administered 2016-05-08: 5 ug/kg/min via INTRAVENOUS
  Filled 2016-05-08: qty 250

## 2016-05-08 MED ORDER — MIDAZOLAM HCL 2 MG/2ML IJ SOLN
2.0000 mg | Freq: Once | INTRAMUSCULAR | Status: AC
  Administered 2016-05-08: 2 mg via INTRAVENOUS

## 2016-05-08 MED ORDER — FENTANYL CITRATE (PF) 2500 MCG/50ML IJ SOLN
INTRAMUSCULAR | Status: AC
  Filled 2016-05-08: qty 50

## 2016-05-08 MED ORDER — METHYLPREDNISOLONE SODIUM SUCC 125 MG IJ SOLR
125.0000 mg | Freq: Once | INTRAMUSCULAR | Status: AC
  Administered 2016-05-08: 125 mg via INTRAVENOUS
  Filled 2016-05-08: qty 2

## 2016-05-08 NOTE — ED Notes (Signed)
CRITICAL VALUE ALERT  Critical value received:  ABG PH 7.19, PH co2 82.8, bicarb 25.2, po2 83.9, sats 93  Date of notification:  05/21/2016  Time of notification:  1851  Critical value read back:Yes.    Nurse who received alert:  RMinter, RN  MD notified (1st page):  Dr. Clarene DukeMcmanus  Time of first page:  1851  MD notified (2nd page):  Time of second page:  Responding MD:  Dr. Clarene DukeMcmanus  Time MD responded:  [phone]

## 2016-05-08 NOTE — ED Notes (Signed)
Still waiting on [facility] to transport pt.

## 2016-05-08 NOTE — ED Triage Notes (Signed)
Pt last seen normal 1130pm last night. Pt reported to EMS he started feeling weaker last night. Pt flaccid to right side from previous stroke x 5 years ago per EMS. Per EMS pt O2 saturation 70's% upon EMS arrival, pt placed on 4l [place] and saturation increased to 90%. Pt lethargic upon ED arrival.

## 2016-05-08 NOTE — ED Notes (Signed)
CRITICAL VALUE ALERT  Critical value received:  Ph- 7.19, pCo2, pO2 121 on 5 L, Sat 96.9, [place]   Date of notification:  05/12/2016  Time of notification:  1735  Critical value read back:yes  Nurse who received alert:  Mickeal Skinner Yamari Ventola RN  MD notified (1st page):  Clarene DukeMcManus

## 2016-05-08 NOTE — ED Notes (Signed)
CRITICAL VALUE ALERT  Critical value received:  CO2 67.1  Date of notification:  05/18/2016  Time of notification:  2039  Critical value read back:Yes.    Nurse who received alert:  Thornton Daleson L Zakeya Junker, RN  MD notified (1st page):  Mcmanus  Time of first page:  2039  MD notified (2nd page):  Time of second page:  Responding MD:  Mcmanus  Time MD responded:  2049

## 2016-05-08 NOTE — ED Provider Notes (Signed)
[facility]-EMERGENCY DEPT Provider Note   CSN: [id number] Arrival date & time: 05/07/2016  1653     History   Chief Complaint Chief Complaint  Patient presents with  . Altered Mental Status    HPI Omar [name] is a 71 y.o. male.  The history is provided by the patient and the EMS personnel. The history is limited by the condition of the patient (AMS).  Altered Mental Status    Pt was seen at 1655. Per EMS, family at scene and pt:  Pt told EMS he was "feeling weak" last night, esp on his right side (flaccid per previous stroke). LKW last night 2330 per family. Family member went to check in on pt today and found pt with AMS. EMS states pt Sats were "70% R/A" on their arrival to scene. EMS placed pt on O2 N/C with improvement of Sats into 90's. Pt states his "legs are more swollen than usual" and he has "had a cough." Per previous medical records: Pt was evaluated by PMD 05/05/16 for cough/congestion, SOB when lying flat; O2 Sat was 90% in office, "volume overloaded on exam," and lasix was prescribed. Pt himself denies fevers, no CP, no abd pain, no N/V/D.   Past Medical History:  Diagnosis Date  . Asthma   . Atrial fibrillation (HCC)   . Gout   . Hypertension   . Morbid obesity (HCC)   . Multinodular goiter   . Osteoarthrosis and allied disorders   . Other and unspecified hyperlipidemia   . Stroke [facility])    right sided hemiparesis  . Thrombocytopenia (HCC)   . Vitamin D deficiency     Patient Active Problem List   Diagnosis Date Noted  . Thrombocytopenia (HCC) 05/04/2014  . Paralysis of right lower extremity (HCC) 09/11/2013  . Hemiparesis affecting right side as late effect of cerebrovascular accident (HCC) 09/11/2013  . Hypothyroidism 09/11/2013  . Cerebrovascular accident with involvement of right side of body (HCC) 04/17/2013  . Allergic rhinitis 06/06/2012  . Gout 06/06/2012  . Osteoarthrosis and allied disorders   . Hyperlipidemia   . Vitamin D deficiency   .  Hypertension   . Asthma   . Paroxysmal atrial fibrillation (HCC) 03/10/2009  . OBESITY, MORBID 03/02/2009    Past Surgical History:  Procedure Laterality Date  . THYROID SURGERY         Home Medications    Prior to Admission medications   Medication Sig Start Date End Date Taking? Authorizing Provider  allopurinol (ZYLOPRIM) 300 MG tablet TAKE 1 TABLET DAILY 02/09/16  Yes Ernestina Penna, MD  amLODipine (NORVASC) 5 MG tablet TAKE 1.5 TABLETS (7.5 MG TOTAL) BY MOUTH DAILY. 03/22/16  Yes Ernestina Penna, MD  atorvastatin (LIPITOR) 20 MG tablet TAKE 1 TABLET (20 MG TOTAL) BY MOUTH DAILY AT 6 PM. 04/17/16  Yes Ernestina Penna, MD  furosemide (LASIX) 20 MG tablet Take 1 tablet (20 mg total) by mouth daily. 05/05/16  Yes Johna Sheriff, MD  gabapentin (NEURONTIN) 300 MG capsule TAKE 1 CAPSULE AT 8 AM 1 AT 6 PM AND 2 AT BEDTIME 04/17/16  Yes Ernestina Penna, MD  KLOR-CON M20 20 MEQ tablet TAKE 1 TABLET BY MOUTH EVERY DAY 04/17/16  Yes Ernestina Penna, MD  levothyroxine (SYNTHROID, LEVOTHROID) 125 MCG tablet TAKE 1 TABLET EVERY DAY 04/24/16  Yes Ernestina Penna, MD  losartan (COZAAR) 100 MG tablet TAKE 1 TABLET (100 MG TOTAL) BY MOUTH DAILY. 03/21/16  Yes Ernestina Penna, MD  metoprolol (LOPRESSOR) 50 MG tablet TAKE 1/2 TABLET 2 TIMES A DAY 03/21/16  Yes Ernestina Penna, MD  Vitamin D, Ergocalciferol, (DRISDOL) 50000 units CAPS capsule Take 1 capsule (50,000 Units total) by mouth every 7 (seven) days. 03/07/16  Yes Ernestina Penna, MD  warfarin (COUMADIN) 7.5 MG tablet TAKE 1 TABLET DAILY AT 6PM 04/17/16  Yes Ernestina Penna, MD  albuterol (PROVENTIL HFA;VENTOLIN HFA) 108 (90 Base) MCG/ACT inhaler Inhale 2 puffs into the lungs every 6 (six) hours as needed for wheezing or shortness of breath. 12/09/15   Ernestina Penna, MD  fluticasone (FLONASE) 50 MCG/ACT nasal spray Place 1 spray into both nostrils 2 (two) times daily as needed for allergies or rhinitis. 04/14/16   Elige Radon Dettinger, MD    Family History Family  History  Problem Relation Age of Onset  . Coronary artery disease Brother     Social History Social History  Substance Use Topics  . Smoking status: Former Games developer  . Smokeless tobacco: Never Used     Comment: quit 12 + years ago   . Alcohol use No     Allergies   Anacin af [acetaminophen] and Niaspan [niacin er]   Review of Systems Review of Systems  Unable to perform ROS: Mental status change     Physical Exam Updated Vital Signs BP 111/65   Pulse 68   Temp 97.8 F (36.6 C)   Resp 16   Ht 5\' 9"  (1.753 m)   Wt (!) 375 lb (170.1 kg)   SpO2 90%   BMI 55.38 kg/m   Patient Vitals for the past 24 hrs:  BP Temp Pulse Resp SpO2 Height Weight  05/17/2016 2306 (!) 110/52 97.3 F (36.3 C) 102 15 95 % - -  05/06/2016 2303 (!) 100/53 97.5 F (36.4 C) (!) 124 13 98 % - -  05/25/2016 2300 102/60 97.5 F (36.4 C) 115 15 96 % - -  05/17/2016 2255 - 98.1 F (36.7 C) 82 13 92 % - -  05/18/2016 2254 (!) 98/53 - - - - - -  06/01/2016 2251 (!) 91/49 98.2 F (36.8 C) 75 12 (!) 86 % - -  05/14/2016 2248 (!) 87/46 98.4 F (36.9 C) 62 14 91 % - -  05/26/2016 2245 (!) 71/35 98.4 F (36.9 C) (!) 50 14 (!) 87 % - -  05/14/2016 2230 (!) 76/44 98.4 F (36.9 C) (!) 55 15 97 % - -  05/20/2016 2215 (!) 79/47 98.4 F (36.9 C) (!) 57 15 96 % - -  05/20/2016 2130 (!) 84/45 98.2 F (36.8 C) (!) 55 15 95 % - -  05/04/2016 2115 (!) 72/46 98.2 F (36.8 C) (!) 59 15 97 % - -  05/26/2016 2100 (!) 67/44 98.2 F (36.8 C) - 15 - - -  05/29/2016 2045 (!) 73/43 98.2 F (36.8 C) 60 15 99 % - -  05/27/2016 2030 (!) 82/55 98.1 F (36.7 C) (!) 57 15 96 % - -  05/31/2016 2015 95/65 98.1 F (36.7 C) 60 15 97 % - -  05/29/2016 2000 108/66 97.9 F (36.6 C) 62 18 100 % - -  05/26/2016 1943 (!) 87/52 97.7 F (36.5 C) 68 15 100 % - -  05/28/2016 1931 (!) 81/50 97.7 F (36.5 C) 66 13 96 % - -  05/06/2016 1915 96/83 97.5 F (36.4 C) 67 25 99 % - -  06/02/2016 1804 (!) 83/53 - 66 11 97 % - -  10-May-2016 1745 103/66 - 62 16 96 % - -    05-10-2016 1725 - - 71 15 - - -  10-May-2016 1715 - - 69 17 99 % - -  2016-05-10 1700 105/59 - 76 21 99 % - -  05-10-16 1650 111/65 97.8 F (36.6 C) 68 16 90 % - -  2016-05-10 1649 - - - - - 5\' 9"  (1.753 m) (!) 375 lb (170.1 kg)     Physical Exam 1700: Physical examination:  Nursing notes reviewed; Vital signs and O2 SAT reviewed;  Constitutional: Well developed, Well nourished, Well hydrated, In no acute distress; Head:  Normocephalic, atraumatic; Eyes: EOMI, PERRL, No scleral icterus; ENMT: Mouth and pharynx normal, Mucous membranes moist; Neck: Supple, Full range of motion, No lymphadenopathy; Cardiovascular: Irregular rate and rhythm, No gallop; Respiratory: Breath sounds diminished & equal bilaterally, No wheezes. Speaking short sentences, Shallow respiratory effort/excursion; Chest: Nontender, Movement normal; Abdomen: Soft, Nontender, Nondistended, Normal bowel sounds; Genitourinary: No CVA tenderness; Extremities: Pulses normal, No tenderness, +3 pedal edema bilat with chronic stasis changes.; Neuro: Lethargic, opens eyes to name and talks with ED staff and EMS, then falls asleep again. +right facial droop and right sided paresis per hx previous CVA. Moves left extremities on stretcher.; Skin: Color normal, Warm, Dry.   ED Treatments / Results  Labs (all labs ordered are listed, but only abnormal results are displayed)   EKG  EKG Interpretation  Date/Time:  Monday 10-May-2016 16:54:52 EST Ventricular Rate:  77 PR Interval:    QRS Duration: 90 QT Interval:  428 QTC Calculation: 485 R Axis:   -26 Text Interpretation:  Atrial fibrillation Borderline left axis deviation Low voltage, precordial leads Borderline prolonged QT interval Baseline wander When compared with ECG of 05/18/2010 QT has lengthened Confirmed by [facility]  MD, Nicholos Johns [phone]) on 05/10/2016 5:08:56 PM       Radiology   Procedures Procedures (including critical care time)   Airway procedure:  Timeout:  Pre-procedure timeout not performed due to emergent nature of procedure; Indication: high CO2, AMS;  Oxygen Saturation: 100 %; Oxygen concentration: 100 %; Preoxygenation: Bag-valve-mask;  Medication: Etomidate; Procedure: Suctioning, RSI, Glidescope laryngoscopy, Endotracheal intubation with 7.66mm cuffed endotracheal tube, Bag-valve-tube ventilation, Mechanical ventilation;  Reassessment: Successful intubation, No bleeding or obvious trauma in oral cavity or posterior pharynx. Teeth intact, without obvious trauma. Breath sounds equal bilaterally, No breath sounds heard over stomach, Chest movement symmetrical, CO2 detector color change, Endotracheal tube fogging, Oxygen saturation normal.  Post-procedure xray obtained.      Medications Ordered in ED Medications  albuterol (PROVENTIL,VENTOLIN) solution continuous neb (not administered)  ipratropium (ATROVENT) nebulizer solution 1 mg (not administered)     Initial Impression / Assessment and Plan / ED Course  I have reviewed the triage vital signs and the nursing notes.  Pertinent labs & imaging results that were available during my care of the patient were reviewed by me and considered in my medical decision making (see chart for details).  MDM Reviewed: previous chart, nursing note and vitals Reviewed previous: labs and ECG Interpretation: labs, ECG and x-ray Total time providing critical care: 30-74 minutes. This excludes time spent performing separately reportable procedures and services. Consults: admitting MD   CRITICAL CARE Performed by: Laray Anger Total critical care time: 90 minutes Critical care time was exclusive of separately billable procedures and treating other patients. Critical care was necessary to treat or prevent imminent or life-threatening deterioration. Critical care was time spent personally by me on the  following activities: development of treatment plan with patient and/or surrogate as well as  nursing, discussions with consultants, evaluation of patient's response to treatment, examination of patient, obtaining history from patient or surrogate, ordering and performing treatments and interventions, ordering and review of laboratory studies, ordering and review of radiographic studies, pulse oximetry and re-evaluation of patient's condition.   Results for orders placed or performed during the hospital encounter of 06/04/16  Blood gas, arterial  Result Value Ref Range   O2 Content 5.0 L/min   Delivery systems NASAL CANNULA    pH, Arterial 7.196 (LL) 7.350 - 7.450   pCO2 arterial 81.8 (HH) 32.0 - 48.0 mmHg   pO2, Arterial 121.0 (H) 83.0 - 108.0 mmHg   Bicarbonate 24.7 20.0 - 28.0 mmol/L   Acid-Base Excess 2.9 (H) 0.0 - 2.0 mmol/L   O2 Saturation 96.9 %   Collection site RIGHT RADIAL    Drawn by [phone]    Sample type ARTERIAL DRAW    Allens test (pass/fail) PASS PASS  Comprehensive metabolic panel  Result Value Ref Range   Sodium 138 135 - 145 mmol/L   Potassium 4.2 3.5 - 5.1 mmol/L   Chloride 105 101 - 111 mmol/L   CO2 31 22 - 32 mmol/L   Glucose, Bld 109 (H) 65 - 99 mg/dL   BUN 23 (H) 6 - 20 mg/dL   Creatinine, Ser 9.141.19 0.61 - 1.24 mg/dL   Calcium 8.4 (L) 8.9 - 10.3 mg/dL   Total Protein 8.5 (H) 6.5 - 8.1 g/dL   Albumin 3.3 (L) 3.5 - 5.0 g/dL   AST 20 15 - 41 U/L   ALT 13 (L) 17 - 63 U/L   Alkaline Phosphatase 71 38 - 126 U/L   Total Bilirubin 0.9 0.3 - 1.2 mg/dL   GFR calc non Af Amer 59 (L) >60 mL/min   GFR calc Af Amer >60 >60 mL/min   Anion gap 2 (L) 5 - 15  Brain natriuretic peptide  Result Value Ref Range   B Natriuretic Peptide 389.0 (H) 0.0 - 100.0 pg/mL  Troponin I  Result Value Ref Range   Troponin I <0.03 <0.03 ng/mL  Lactic acid, plasma  Result Value Ref Range   Lactic Acid, Venous 0.7 0.5 - 1.9 mmol/L  CBC with Differential  Result Value Ref Range   WBC 3.9 (L) 4.0 - 10.5 K/uL   RBC 3.38 (L) 4.22 - 5.81 MIL/uL   Hemoglobin 11.1 (L) 13.0 - 17.0  g/dL   HCT 78.235.9 (L) 95.639.0 - 21.352.0 %   MCV 106.2 (H) 78.0 - 100.0 fL   MCH 32.8 26.0 - 34.0 pg   MCHC 30.9 30.0 - 36.0 g/dL   RDW 08.615.8 (H) 57.811.5 - 46.915.5 %   Platelets 137 (L) 150 - 400 K/uL   Neutrophils Relative % 71 %   Neutro Abs 2.8 1.7 - 7.7 K/uL   Lymphocytes Relative 13 %   Lymphs Abs 0.5 (L) 0.7 - 4.0 K/uL   Monocytes Relative 14 %   Monocytes Absolute 0.6 0.1 - 1.0 K/uL   Eosinophils Relative 2 %   Eosinophils Absolute 0.1 0.0 - 0.7 K/uL   Basophils Relative 0 %   Basophils Absolute 0.0 0.0 - 0.1 K/uL  Urinalysis, Routine w reflex microscopic  Result Value Ref Range   Color, Urine AMBER (A) YELLOW   APPearance HAZY (A) CLEAR   Specific Gravity, Urine 1.014 1.005 - 1.030   pH 5.0 5.0 - 8.0   Glucose, UA NEGATIVE  NEGATIVE mg/dL   Hgb urine dipstick MODERATE (A) NEGATIVE   Bilirubin Urine NEGATIVE NEGATIVE   Ketones, ur NEGATIVE NEGATIVE mg/dL   Protein, ur 30 (A) NEGATIVE mg/dL   Nitrite NEGATIVE NEGATIVE   Leukocytes, UA NEGATIVE NEGATIVE   RBC / HPF 0-5 0 - 5 RBC/hpf   WBC, UA 0-5 0 - 5 WBC/hpf   Bacteria, UA RARE (A) NONE SEEN   Mucous PRESENT    Budding Yeast PRESENT    Hyaline Casts, UA PRESENT   Protime-INR  Result Value Ref Range   Prothrombin Time 26.3 (H) 11.4 - 15.2 seconds   INR 2.37   Blood gas, arterial  Result Value Ref Range   FIO2 50.00    Delivery systems BILEVEL POSITIVE AIRWAY PRESSURE    Mode BILEVEL POSITIVE AIRWAY PRESSURE    Inspiratory PAP 22    Expiratory PAP 8    pH, Arterial 7.192 (LL) 7.350 - 7.450   pCO2 arterial 82.8 (HH) 32.0 - 48.0 mmHg   pO2, Arterial 83.9 83.0 - 108.0 mmHg   Bicarbonate 25.2 20.0 - 28.0 mmol/L   Acid-Base Excess 3.0 (H) 0.0 - 2.0 mmol/L   O2 Saturation 93.2 %   Collection site LEFT RADIAL    Drawn by [id number]    Sample type ARTERIAL DRAW    Allens test (pass/fail) PASS PASS  Blood gas, arterial  Result Value Ref Range   FIO2 0.40    O2 Content 40.0 L/min   Delivery systems VENTILATOR    Mode PRESSURE  REGULATED VOLUME CONTROL    VT 540 mL   LHR 15.0 resp/min   Peep/cpap 5.0 cm H20   pH, Arterial 7.251 (L) 7.350 - 7.450   pCO2 arterial 67.1 (HH) 32.0 - 48.0 mmHg   pO2, Arterial 72.1 (L) 83.0 - 108.0 mmHg   Bicarbonate 24.8 20.0 - 28.0 mmol/L   Acid-Base Excess 1.9 0.0 - 2.0 mmol/L   O2 Saturation 91.2 %   Collection site LEFT RADIAL    Drawn by [id number]    Sample type ARTERIAL DRAW    Allens test (pass/fail) PASS PASS    Dg Chest Port 1 View Result Date: 05/22/2016 CLINICAL DATA:  ET tube placement EXAM: PORTABLE CHEST 1 VIEW COMPARISON:  05/09/2016 FINDINGS: Interval intubation, the tip of the endotracheal tube is approximately 2 cm superior to the carina. Esophageal tube tip is not well visualize, tubing extends toward the diaphragm. No significant interval change in suspected pleural effusions and hazy edema or infiltrates in the lower lung zones. Stable cardiomegaly with central congestion. IMPRESSION: 1. Tip of the endotracheal tube is approximately 2 cm superior to the carina, esophageal tube courses toward the diaphragm but the tip is not included 2. No significant interval change in probable bilateral effusions, cardiomegaly, central vascular congestion and diffuse pulmonary edema. Electronically Signed   By: Jasmine Pang M.D.   On: 05/20/2016 19:34    Dg Chest Port 1 View Result Date: 05/31/2016 CLINICAL DATA:  Hypoxia.  History of asthma. EXAM: PORTABLE CHEST 1 VIEW COMPARISON:  Chest radiograph May 05, 2016 FINDINGS: Cardiac silhouette is moderate to severely enlarged. Mediastinal silhouette is nonsuspicious. Increasing interstitial and alveolar airspace opacities. Probable LEFT pleural effusion though LEFT costophrenic angle incompletely imaged. No pneumothorax. Tube in projects over LEFT chest, likely external to the patient. IMPRESSION: Stable cardiomegaly. Worsening interstitial and alveolar airspace opacities, small suspected LEFT pleural effusion. Constellation of findings most  consistent with CHF. Electronically Signed   By: Michel Santee.D.  On: 05/23/2016 17:42    Ct Head Wo Contrast Result Date: 05/06/2016 CLINICAL DATA:  71 year old male with altered mental status, intubated. Remote infarct with right side neurologic deficits. Initial encounter. EXAM: CT HEAD WITHOUT CONTRAST TECHNIQUE: Contiguous axial images were obtained from the base of the skull through the vertex without intravenous contrast. COMPARISON:  [facility] Head CTs without contrast 05/19/2010 and earlier. FINDINGS: Brain: No ventriculomegaly. No acute intracranial hemorrhage identified. No midline shift, mass effect, or evidence of intracranial mass lesion. 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 Elsewhere there is minimal to mild nonspecific white matter hypodensity. No superimposed acute cortically based infarct identified. Vascular: Calcified atherosclerosis at the skull base. No suspicious intracranial vascular hyperdensity. Skull: No acute osseous abnormality identified. Sinuses/Orbits: Visualized paranasal sinuses and mastoids are stable and well pneumatized. There is fluid in the nasal cavity and visible pharynx in the setting of intubation. Other: No acute scalp soft tissue findings. 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 These orbital findings are only mildly progressed since 2012. The globes appear intact. No intraorbital inflammation. IMPRESSION: 1.  No acute intracranial abnormality. 2. 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 3. 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 Electronically Signed   By: Odessa Fleming M.D.   On: 05/22/2016 20:37     1900:  Pt arrived with AMS, likely due to hypercarbia vs hypoxia, given pt's low O2 Sats last week at PMD's office and again today with EMS. Initial ABG obtained on N/C, and bipap started. Pt lethargic, opens eyes to loud name and touch, falls back asleep. 2nd ABG obtained while on bipap x1 hour. Given minimal change, d/w family/POA, agreeable for pt to be intubated. Pt intubated without incident. BP softer. Judicious IVF boluses given. BP unable to tolerate IV versed; will dose IV fentanyl gtt for sedation.  CT-H pending.   2145:  3rd ABG slowly improving. BP labile, but improves with judicious IVF. Will also dose IV steroid for possible adrenal insufficiency. Doubt sepsis with normal lactic acid and no fever.  H/H and platelets per baseline.  Family/POA updated. T/C to [facility] PCCM Dr. Tyson Alias, case discussed, including:  HPI, pertinent PM/SHx, VS/PE, dx testing, ED course and treatment:  Agreeable to accept transfer/admit.   2230:  SBP's continue to drift into 80's. Will start low dose IV dopamine and titrate prn. Transport to [facility] ICU en route. T/C to PCCM Dr. Tyson Alias, case discussed, including:  HPI, pertinent PM/SHx, VS/PE, dx testing, ED course and treatment:  Agrees with IV dopamine, can also add IV levophed prn.   2255:  Low dose IV dopamine started:  SBP's 90's with MAP's 67. [facility] here for transport.   2310:  [facility] requesting repeat ABG before will transport.    Final Clinical Impressions(s) / ED Diagnoses   Final diagnoses:  None    New Prescriptions New Prescriptions   No medications on file      Samuel Jester, DO 05/12/16 1303

## 2016-05-09 ENCOUNTER — Inpatient Hospital Stay (HOSPITAL_COMMUNITY): Payer: Medicare Other

## 2016-05-09 DIAGNOSIS — J9601 Acute respiratory failure with hypoxia: Secondary | ICD-10-CM

## 2016-05-09 DIAGNOSIS — J9602 Acute respiratory failure with hypercapnia: Secondary | ICD-10-CM

## 2016-05-09 DIAGNOSIS — I361 Nonrheumatic tricuspid (valve) insufficiency: Secondary | ICD-10-CM

## 2016-05-09 LAB — BRAIN NATRIURETIC PEPTIDE: BNP: 303.5 pg/mL — ABNORMAL HIGH (ref 0.0–100.0)

## 2016-05-09 LAB — BLOOD GAS, ARTERIAL
ACID-BASE EXCESS: 0.7 mmol/L (ref 0.0–2.0)
Acid-Base Excess: 0.4 mmol/L (ref 0.0–2.0)
BICARBONATE: 25.8 mmol/L (ref 20.0–28.0)
Bicarbonate: 27.2 mmol/L (ref 20.0–28.0)
Drawn by: 365271
Drawn by: 365271
FIO2: 70
FIO2: 70
LHR: 22 {breaths}/min
LHR: 26 {breaths}/min
MECHVT: 540 mL
O2 Saturation: 96.1 %
O2 Saturation: 96.6 %
PATIENT TEMPERATURE: 98.6
PATIENT TEMPERATURE: 98.6
PEEP/CPAP: 8 cmH2O
PEEP: 5 cmH2O
PH ART: 7.342 — AB (ref 7.350–7.450)
PO2 ART: 88.6 mmHg (ref 83.0–108.0)
VT: 540 mL
pCO2 arterial: 68.6 mmHg (ref 32.0–48.0)
pCO2 arterial: 48.9 mmHg — ABNORMAL HIGH (ref 32.0–48.0)
pH, Arterial: 7.223 — ABNORMAL LOW (ref 7.350–7.450)
pO2, Arterial: 98.3 mmHg (ref 83.0–108.0)

## 2016-05-09 LAB — ECHOCARDIOGRAM COMPLETE
CHL CUP MV DEC (S): 194
CHL CUP TV REG PEAK VELOCITY: 313 cm/s
EERAT: 10.44
EWDT: 194 ms
FS: 39 % (ref 28–44)
Height: 66 in
IV/PV OW: 1.23
LADIAMINDEX: 2.1 cm/m2
LASIZE: 53 mm
LAVOLA4C: 56.9 mL
LEFT ATRIUM END SYS DIAM: 53 mm
LV PW d: 11.1 mm — AB (ref 0.6–1.1)
LVEEAVG: 10.44
LVEEMED: 10.44
LVELAT: 11.4 cm/s
LVOT SV: 70 mL
LVOT VTI: 18.4 cm
LVOT area: 3.8 cm2
LVOT diameter: 22 mm
LVOT peak vel: 105 cm/s
MVPG: 6 mmHg
MVPKEVEL: 119 m/s
RV LATERAL S' VELOCITY: 9.03 cm/s
TDI e' lateral: 11.4
TDI e' medial: 10.2
TRMAXVEL: 313 cm/s
Weight: 5488.57 oz

## 2016-05-09 LAB — CBC
HEMATOCRIT: 37.6 % — AB (ref 39.0–52.0)
Hemoglobin: 11.6 g/dL — ABNORMAL LOW (ref 13.0–17.0)
MCH: 32.4 pg (ref 26.0–34.0)
MCHC: 30.9 g/dL (ref 30.0–36.0)
MCV: 105 fL — AB (ref 78.0–100.0)
Platelets: 130 10*3/uL — ABNORMAL LOW (ref 150–400)
RBC: 3.58 MIL/uL — ABNORMAL LOW (ref 4.22–5.81)
RDW: 15.6 % — AB (ref 11.5–15.5)
WBC: 7 10*3/uL (ref 4.0–10.5)

## 2016-05-09 LAB — MRSA PCR SCREENING: MRSA by PCR: NEGATIVE

## 2016-05-09 LAB — GLUCOSE, CAPILLARY
Glucose-Capillary: 140 mg/dL — ABNORMAL HIGH (ref 65–99)
Glucose-Capillary: 137 mg/dL — ABNORMAL HIGH (ref 65–99)
Glucose-Capillary: 107 mg/dL — ABNORMAL HIGH (ref 65–99)
Glucose-Capillary: 95 mg/dL (ref 65–99)
Glucose-Capillary: 93 mg/dL (ref 65–99)
Glucose-Capillary: 109 mg/dL — ABNORMAL HIGH (ref 65–99)

## 2016-05-09 LAB — PROTIME-INR
INR: 2.41
PROTHROMBIN TIME: 26.7 s — AB (ref 11.4–15.2)

## 2016-05-09 LAB — BASIC METABOLIC PANEL
ANION GAP: 4 — AB (ref 5–15)
BUN: 23 mg/dL — ABNORMAL HIGH (ref 6–20)
CALCIUM: 8.8 mg/dL — AB (ref 8.9–10.3)
CO2: 27 mmol/L (ref 22–32)
Chloride: 109 mmol/L (ref 101–111)
Creatinine, Ser: 1.04 mg/dL (ref 0.61–1.24)
GLUCOSE: 130 mg/dL — AB (ref 65–99)
POTASSIUM: 4.4 mmol/L (ref 3.5–5.1)
Sodium: 140 mmol/L (ref 135–145)

## 2016-05-09 LAB — MAGNESIUM
MAGNESIUM: 1.8 mg/dL (ref 1.7–2.4)
Magnesium: 1.5 mg/dL — ABNORMAL LOW (ref 1.7–2.4)
Magnesium: 1.8 mg/dL (ref 1.7–2.4)

## 2016-05-09 LAB — APTT: aPTT: 39 seconds — ABNORMAL HIGH (ref 24–36)

## 2016-05-09 LAB — PHOSPHORUS
Phosphorus: 3.6 mg/dL (ref 2.5–4.6)
Phosphorus: 3.6 mg/dL (ref 2.5–4.6)

## 2016-05-09 LAB — TSH: TSH: 0.598 u[IU]/mL (ref 0.350–4.500)

## 2016-05-09 LAB — PROCALCITONIN: Procalcitonin: 0.45 ng/mL

## 2016-05-09 MED ORDER — ATORVASTATIN CALCIUM 20 MG PO TABS
20.0000 mg | ORAL_TABLET | Freq: Every day | ORAL | Status: DC
  Administered 2016-05-09 – 2016-05-10 (×2): 20 mg
  Filled 2016-05-09 (×2): qty 1

## 2016-05-09 MED ORDER — VITAL HIGH PROTEIN PO LIQD
1000.0000 mL | ORAL | Status: DC
  Administered 2016-05-09 – 2016-05-10 (×3): 1000 mL

## 2016-05-09 MED ORDER — DOPAMINE-DEXTROSE 3.2-5 MG/ML-% IV SOLN: 0.0000 ug/kg/min | Freq: Once | INTRAVENOUS | Status: DC

## 2016-05-09 MED ORDER — CHLORHEXIDINE GLUCONATE 0.12% ORAL RINSE (MEDLINE KIT)
15.0000 mL | Freq: Two times a day (BID) | OROMUCOSAL | Status: DC
  Administered 2016-05-09 – 2016-06-21 (×79): 15 mL via OROMUCOSAL

## 2016-05-09 MED ORDER — PANTOPRAZOLE SODIUM 40 MG PO PACK
40.0000 mg | PACK | ORAL | Status: DC
  Administered 2016-05-09 – 2016-05-10 (×2): 40 mg
  Filled 2016-05-09 (×4): qty 20

## 2016-05-09 MED ORDER — ATORVASTATIN CALCIUM 20 MG PO TABS: 20.0000 mg | ORAL_TABLET | Freq: Every day | ORAL | Status: DC

## 2016-05-09 MED ORDER — ORAL CARE MOUTH RINSE
15.0000 mL | Freq: Four times a day (QID) | OROMUCOSAL | Status: DC
  Administered 2016-05-09 – 2016-06-20 (×155): 15 mL via OROMUCOSAL

## 2016-05-09 MED ORDER — SODIUM CHLORIDE 0.9 % IV SOLN
0.0000 ug/min | INTRAVENOUS | Status: DC
  Administered 2016-05-09: 60 ug/min via INTRAVENOUS
  Filled 2016-05-09 (×2): qty 1

## 2016-05-09 MED ORDER — VITAL HIGH PROTEIN PO LIQD: 1000.0000 mL | ORAL | Status: DC

## 2016-05-09 MED ORDER — PERFLUTREN LIPID MICROSPHERE
1.0000 mL | INTRAVENOUS | Status: AC | PRN
  Administered 2016-05-09: 3 mL via INTRAVENOUS
  Filled 2016-05-09: qty 10

## 2016-05-09 MED ORDER — PIPERACILLIN-TAZOBACTAM 3.375 G IVPB
3.3750 g | Freq: Three times a day (TID) | INTRAVENOUS | Status: DC
  Administered 2016-05-09 – 2016-05-11 (×6): 3.375 g via INTRAVENOUS
  Filled 2016-05-09 (×8): qty 50

## 2016-05-09 MED ORDER — FENTANYL BOLUS VIA INFUSION
25.0000 ug | INTRAVENOUS | Status: DC | PRN
  Filled 2016-05-09: qty 25

## 2016-05-09 MED ORDER — MIDAZOLAM HCL 2 MG/2ML IJ SOLN
1.0000 mg | INTRAMUSCULAR | Status: DC | PRN
  Administered 2016-05-09: 1 mg via INTRAVENOUS
  Filled 2016-05-09: qty 2

## 2016-05-09 MED ORDER — LEVOTHYROXINE SODIUM 125 MCG PO TABS
125.0000 ug | ORAL_TABLET | Freq: Every day | ORAL | Status: DC
  Administered 2016-05-09 – 2016-05-11 (×3): 125 ug
  Filled 2016-05-09 (×3): qty 1

## 2016-05-09 MED ORDER — FENTANYL 2500MCG IN NS 250ML (10MCG/ML) PREMIX INFUSION
25.0000 ug/h | INTRAVENOUS | Status: DC
  Administered 2016-05-09: 75 ug/h via INTRAVENOUS
  Administered 2016-05-11: 50 ug/h via INTRAVENOUS
  Filled 2016-05-09 (×2): qty 250

## 2016-05-09 MED ORDER — SODIUM CHLORIDE 0.9 % IV SOLN
250.0000 mL | INTRAVENOUS | Status: DC
  Administered 2016-05-09 – 2016-05-11 (×2): 250 mL via INTRAVENOUS

## 2016-05-09 MED ORDER — PANTOPRAZOLE SODIUM 40 MG IV SOLR
40.0000 mg | Freq: Every day | INTRAVENOUS | Status: DC
  Administered 2016-05-09: 40 mg via INTRAVENOUS
  Filled 2016-05-09: qty 40

## 2016-05-09 MED ORDER — VITAL HIGH PROTEIN PO LIQD
1000.0000 mL | ORAL | Status: DC
  Administered 2016-05-09: 1000 mL
  Filled 2016-05-09: qty 1000

## 2016-05-09 MED ORDER — HEPARIN SODIUM (PORCINE) 5000 UNIT/ML IJ SOLN: 5000.0000 [IU] | Freq: Three times a day (TID) | INTRAMUSCULAR | Status: DC

## 2016-05-09 MED ORDER — MAGNESIUM SULFATE 2 GM/50ML IV SOLN
2.0000 g | Freq: Once | INTRAVENOUS | Status: AC
  Administered 2016-05-09: 2 g via INTRAVENOUS
  Filled 2016-05-09: qty 50

## 2016-05-09 MED ORDER — SODIUM CHLORIDE 0.9 % IV SOLN: 250.0000 mL | INTRAVENOUS | Status: DC | PRN

## 2016-05-09 MED ORDER — DOPAMINE-DEXTROSE 3.2-5 MG/ML-% IV SOLN
0.0000 ug/kg/min | INTRAVENOUS | Status: DC
  Administered 2016-05-09: 2 ug/kg/min via INTRAVENOUS
  Filled 2016-05-09: qty 250

## 2016-05-09 MED ORDER — LEVOTHYROXINE SODIUM 125 MCG PO TABS: 125.0000 ug | ORAL_TABLET | Freq: Every day | ORAL | Status: DC

## 2016-05-09 MED ORDER — SODIUM CHLORIDE 0.9 % IV SOLN: INTRAVENOUS | Status: DC

## 2016-05-09 MED ORDER — PRO-STAT SUGAR FREE PO LIQD
30.0000 mL | Freq: Four times a day (QID) | ORAL | Status: DC
  Administered 2016-05-09 – 2016-05-10 (×6): 30 mL
  Filled 2016-05-09 (×8): qty 30

## 2016-05-09 MED ORDER — MIDAZOLAM HCL 2 MG/2ML IJ SOLN
1.0000 mg | INTRAMUSCULAR | Status: DC | PRN
  Filled 2016-05-09: qty 2

## 2016-05-09 MED ORDER — PRO-STAT SUGAR FREE PO LIQD
30.0000 mL | Freq: Two times a day (BID) | ORAL | Status: DC
  Administered 2016-05-09: 30 mL
  Filled 2016-05-09: qty 30

## 2016-05-09 MED ORDER — SODIUM CHLORIDE 0.9 % IV SOLN
0.0000 ug/min | INTRAVENOUS | Status: DC
  Administered 2016-05-09: 40 ug/min via INTRAVENOUS
  Administered 2016-05-09: 50 ug/min via INTRAVENOUS
  Administered 2016-05-10 (×2): 140 ug/min via INTRAVENOUS
  Administered 2016-05-10: 170 ug/min via INTRAVENOUS
  Filled 2016-05-09 (×5): qty 4

## 2016-05-09 MED ORDER — FENTANYL CITRATE (PF) 100 MCG/2ML IJ SOLN: 50.0000 ug | Freq: Once | INTRAMUSCULAR | Status: DC

## 2016-05-09 MED FILL — Phenylephrine HCl Inj 10 MG/ML: INTRAMUSCULAR | Qty: 1 | Status: AC

## 2016-05-09 MED FILL — Dextrose Inj 5%: INTRAVENOUS | Qty: 1000 | Status: AC

## 2016-05-09 NOTE — Progress Notes (Signed)
Pharmacy Antibiotic Note  Omar [name] is a 71 y.o. male admitted on 05/28/2016 with pneumonia.  Pharmacy has been consulted for Zosyn dosing. SCr 1.04 (up from 0.82), normalized CrCl ~ 65 mL/min. WBC wnl.   Plan: Zosyn 3.375g IV q8h (4 hour infusion).  Will monitor clinical status and culture results Will monitor renal function and will adjust as needed.  *Consider narrowing further to Unasyn - Na level wnl  Height: 5\' 6"  (167.6 cm) Weight: (!) 343 lb 0.6 oz (155.6 kg) IBW/kg (Calculated) : 63.8  Temp (24hrs), Avg:98.2 F (36.8 C), Min:97.2 F (36.2 C), Max:99.7 F (37.6 C)   Recent Labs Lab 05/05/16 1757 05/25/2016 1736 05/09/16 0408  WBC 4.4 3.9* 7.0  CREATININE 0.82 1.19 1.04  LATICACIDVEN  --  0.7  --     Estimated Creatinine Clearance: 91.3 mL/min (by C-G formula based on SCr of 1.04 mg/dL).  >> Morbid obesity, using normalized CrCl ~ 65 mL/min  Allergies  Allergen Reactions  . Anacin Af [Acetaminophen]   . Niaspan [Niacin Er]     Antimicrobials this admission: Zosyn 3/6 >>  Dose adjustments this admission:  Microbiology results: 3/6 BCx:  3/5 UCx:  3/6 Sputum:  3/6 MRSA PCR: negative  Thank you for allowing pharmacy to be a part of this patient's care.  Link SnufferJessica Timon Geissinger, PharmD, BCPS Clinical Pharmacist Clinical phone 05/09/2016 until 3:30 PM - [phone] After hours, please call #[phone] 05/09/2016 11:16 AM

## 2016-05-09 NOTE — ED Notes (Signed)
[facility] wanting to start Neo. Asking this nurse for it. They were ask if MD was putting order in so it could be pulled. No order showing up, [facility] went to truck & removed.

## 2016-05-09 NOTE — Progress Notes (Signed)
Initial Nutrition Assessment  DOCUMENTATION CODES:   Morbid obesity  INTERVENTION:    Vital High Protein at 50 ml/h (1200 ml per day)  Pro-stat 30 ml QID  Provides 1600 kcal, 165 gm protein, 1204 ml free water daily  NUTRITION DIAGNOSIS:   Inadequate oral intake related to inability to eat as evidenced by NPO status.  GOAL:   Provide needs based on ASPEN/SCCM guidelines  MONITOR:   Vent status, TF tolerance, I & O's, Labs  REASON FOR ASSESSMENT:   Consult Enteral/tube feeding initiation and management  ASSESSMENT:   71 year old male, morbidly obese, [facility] with altered mental status. He was found to be hypercarbic and hypoxemic and was intubated. He was started on dopamine after intubation for blood pressure support. [facility].  Patient is currently intubated on ventilator support MV: 14.1 L/min Temp (24hrs), Avg:98.3 F (36.8 C), Min:97.2 F (36.2 C), Max:100.2 F (37.9 C)  Labs reviewed: magnesium 1.5 Medications reviewed. Nutrition-Focused physical exam completed. Findings are no fat depletion, no muscle depletion, and severe edema.   Diet Order:  Diet NPO time specified  Skin:  Reviewed, no issues  Last BM:  PTA  Height:   Ht Readings from Last 1 Encounters:  05/09/16 5\' 6"  (1.676 m)    Weight:   Wt Readings from Last 1 Encounters:  05/09/16 (!) 343 lb 0.6 oz (155.6 kg)    Ideal Body Weight:  64.5 kg  BMI:  Body mass index is 55.37 kg/m.  Estimated Nutritional Needs:   Kcal:  [id number]  Protein:  161 gm  Fluid:  2-2.5 L  EDUCATION NEEDS:   No education needs identified at this time  Joaquin CourtsKimberly Trevone Prestwood, RD, LDN, CNSC Pager [phone] After Hours Pager [phone]

## 2016-05-09 NOTE — H&P (Signed)
PULMONARY / CRITICAL CARE MEDICINE   Name: Omar [name] MRN: [id number] DOB: 1945/06/07    ADMISSION DATE:  05/07/2016 CONSULTATION DATE:  3/6  REFERRING MD:  Dr. Clarene Duke EDP Jeani Hawking  CHIEF COMPLAINT:  AMS  HISTORY OF PRESENT ILLNESS:   71 year old male with PMH as below, which is significant for Atrial fibrillation, CVA, asthma, and morbid obesity who was found to be altered by family 3/5 afternoon. The night before he was complaining of some R sided weakness, which is reportedly chronic due to stroke in the past. Family called EMS and found him to be hypoxic with sats 70% on room air. He was transferred to [facility] ED for further evaluation. ABG was performed demonstrating profound hypercarbic respiratory failure with pH 7.19. He was started on BiPAP, which offered minimal improvement, ultimately leading to his intubation. After intubation he became hypotensive, which did not improve with IVF. He was started on dopamine. PCCM asked to accept patient in transfer to [facility].   PAST MEDICAL HISTORY :  He  has a past medical history of Asthma; Atrial fibrillation (HCC); Gout; Hypertension; Morbid obesity (HCC); Multinodular goiter; Osteoarthrosis and allied disorders; Other and unspecified hyperlipidemia; Stroke [facility]); Thrombocytopenia (HCC); and Vitamin D deficiency.  PAST SURGICAL HISTORY: He  has a past surgical history that includes Thyroid surgery.  Allergies  Allergen Reactions  . Anacin Af [Acetaminophen]   . Niaspan [Niacin Er]     No current facility-administered medications on file prior to encounter.    Current Outpatient Prescriptions on File Prior to Encounter  Medication Sig  . albuterol (PROVENTIL HFA;VENTOLIN HFA) 108 (90 Base) MCG/ACT inhaler Inhale 2 puffs into the lungs every 6 (six) hours as needed for wheezing or shortness of breath.  . allopurinol (ZYLOPRIM) 300 MG tablet TAKE 1 TABLET DAILY  . amLODipine (NORVASC) 5 MG tablet TAKE 1.5 TABLETS  (7.5 MG TOTAL) BY MOUTH DAILY.  Marland Kitchen atorvastatin (LIPITOR) 20 MG tablet TAKE 1 TABLET (20 MG TOTAL) BY MOUTH DAILY AT 6 PM.  . fluticasone (FLONASE) 50 MCG/ACT nasal spray Place 1 spray into both nostrils 2 (two) times daily as needed for allergies or rhinitis.  . furosemide (LASIX) 20 MG tablet Take 1 tablet (20 mg total) by mouth daily.  Marland Kitchen gabapentin (NEURONTIN) 300 MG capsule TAKE 1 CAPSULE AT 8 AM 1 AT 6 PM AND 2 AT BEDTIME  . KLOR-CON M20 20 MEQ tablet TAKE 1 TABLET BY MOUTH EVERY DAY  . levothyroxine (SYNTHROID, LEVOTHROID) 125 MCG tablet TAKE 1 TABLET EVERY DAY  . losartan (COZAAR) 100 MG tablet TAKE 1 TABLET (100 MG TOTAL) BY MOUTH DAILY.  . metoprolol (LOPRESSOR) 50 MG tablet TAKE 1/2 TABLET 2 TIMES A DAY  . Vitamin D, Ergocalciferol, (DRISDOL) 50000 units CAPS capsule Take 1 capsule (50,000 Units total) by mouth every 7 (seven) days.  Marland Kitchen warfarin (COUMADIN) 7.5 MG tablet TAKE 1 TABLET DAILY AT 6PM    FAMILY HISTORY:  His indicated that the status of his brother is unknown.    SOCIAL HISTORY: He  reports that he has quit smoking. He has never used smokeless tobacco. He reports that he does not drink alcohol or use drugs.  REVIEW OF SYSTEMS:   unable  SUBJECTIVE:    VITAL SIGNS: BP (!) 101/32   Pulse 99   Temp 97.5 F (36.4 C)   Resp (!) 23   Ht 5\' 9"  (1.753 m)   Wt (!) 170.1 kg (375 lb)   SpO2  94%   BMI 55.38 kg/m   HEMODYNAMICS:    VENTILATOR SETTINGS: Vent Mode: PRVC FiO2 (%):  [40 %-70 %] 70 % Set Rate:  [15 bmp-22 bmp] 22 bmp Vt Set:  [540 mL] 540 mL PEEP:  [5 cmH20-8 cmH20] 8 cmH20 Plateau Pressure:  [28 cmH20-34 cmH20] 34 cmH20  INTAKE / OUTPUT: No intake/output data recorded.  PHYSICAL EXAMINATION: General:  Morbidly obese male in NAD Neuro:  Awake, alert on vent. Follows commands HEENT:  [place]/AT, PERRL, no apparent JVD due to neck girth. Cardiovascular:  Irreg, irreg. Rate controlled Lungs:  Coarse bilateral Abdomen:  Obese, soft,  non-tender Musculoskeletal:  No acute deformity Skin:  Excoriation to pannus fold and under legs.   LABS:  BMET  Recent Labs Lab 05/05/16 1757 05/31/2016 1736  NA 143 138  K 4.4 4.2  CL 104 105  CO2 24 31  BUN 15 23*  CREATININE 0.82 1.19  GLUCOSE 100* 109*    Electrolytes  Recent Labs Lab 05/05/16 1757 06/02/2016 1736  CALCIUM 8.9 8.4*    CBC  Recent Labs Lab 05/05/16 1757 05/22/2016 1736  WBC 4.4 3.9*  HGB  --  11.1*  HCT 36.4* 35.9*  PLT 146* 137*    Coag's  Recent Labs Lab 05/09/2016 1736  INR 2.37    Sepsis Markers  Recent Labs Lab 06/01/2016 1736  LATICACIDVEN 0.7    ABG  Recent Labs Lab 05/31/2016 1840 05/27/2016 2026 05/26/2016 2328  PHART 7.192* 7.251* 7.262*  PCO2ART 82.8* 67.1* 62.8*  PO2ART 83.9 72.1* 120*    Liver Enzymes  Recent Labs Lab 06/03/2016 1736  AST 20  ALT 13*  ALKPHOS 71  BILITOT 0.9  ALBUMIN 3.3*    Cardiac Enzymes  Recent Labs Lab 05/04/2016 1736  TROPONINI <0.03    Glucose No results for input(s): GLUCAP in the last 168 hours.  Imaging Ct Head Wo Contrast  Result Date: 05/16/2016 CLINICAL DATA:  71 year old male with altered mental status, intubated. Remote infarct with right side neurologic deficits. Initial encounter. EXAM: CT HEAD WITHOUT CONTRAST TECHNIQUE: Contiguous axial images were obtained from the base of the skull through the vertex without intravenous contrast. COMPARISON:  [facility] Head CTs without contrast 05/19/2010 and earlier. FINDINGS: Brain: No ventriculomegaly. No acute intracranial hemorrhage identified. No midline shift, mass effect, or evidence of intracranial mass lesion. 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 Elsewhere there is minimal to mild nonspecific white matter hypodensity. No superimposed acute cortically based infarct identified. Vascular: Calcified  atherosclerosis at the skull base. No suspicious intracranial vascular hyperdensity. Skull: No acute osseous abnormality identified. Sinuses/Orbits: Visualized paranasal sinuses and mastoids are stable and well pneumatized. There is fluid in the nasal cavity and visible pharynx in the setting of intubation. Other: No acute scalp soft tissue findings. 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 These orbital findings are only mildly progressed since 2012. The globes appear intact. No intraorbital inflammation. IMPRESSION: 1.  No acute intracranial abnormality. 2. 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 3. 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 Electronically Signed   By: Althea Grimmer.D.  On: 05/30/2016 20:37   Dg Chest Port 1 View  Result Date: 05/18/2016 CLINICAL DATA:  ET tube placement EXAM: PORTABLE CHEST 1 VIEW COMPARISON:  05/07/2016 FINDINGS: Interval intubation, the tip of the endotracheal tube is approximately 2 cm superior to the carina. Esophageal tube tip is not well visualize, tubing extends toward the diaphragm. No significant interval change in suspected pleural effusions and hazy edema or infiltrates in the lower lung zones. Stable cardiomegaly with central congestion. IMPRESSION: 1. Tip of the endotracheal tube is approximately 2 cm superior to the carina, esophageal tube courses toward the diaphragm but the tip is not included 2. No significant interval change in probable bilateral effusions, cardiomegaly, central vascular congestion and diffuse pulmonary edema. Electronically Signed   By: Jasmine PangKim  Fujinaga M.D.   On: 05/15/2016 19:34   Dg Chest Port 1 View  Result Date:  05/04/2016 CLINICAL DATA:  Hypoxia.  History of asthma. EXAM: PORTABLE CHEST 1 VIEW COMPARISON:  Chest radiograph May 05, 2016 FINDINGS: Cardiac silhouette is moderate to severely enlarged. Mediastinal silhouette is nonsuspicious. Increasing interstitial and alveolar airspace opacities. Probable LEFT pleural effusion though LEFT costophrenic angle incompletely imaged. No pneumothorax. Tube in projects over LEFT chest, likely external to the patient. IMPRESSION: Stable cardiomegaly. Worsening interstitial and alveolar airspace opacities, small suspected LEFT pleural effusion. Constellation of findings most consistent with CHF. Electronically Signed   By: Awilda Metroourtnay  Bloomer M.D.   On: 05/21/2016 17:42     STUDIES:    CULTURES: Urine 3/5 >  ANTIBIOTICS:   SIGNIFICANT EVENTS:   LINES/TUBES: ETT 3/6 >  DISCUSSION: 71 year old male, morbidly obese, [facility] to [facility] with altered mental status. He was found to be hypercarbic and hypoxemic and was intubated. He was started on dopamine after intubation for blood pressure support. [facility]   ASSESSMENT / PLAN:  PULMONARY A: Acute hypercarbic and hypoxemic respiratory failure. Likely secondary to pulmonary edema and decompensated OHS.  H/o asthma without acute exacerbation  P:   Full vent support Wean Fio2 for O2 sats 88-95% ABG CXR to verify tube placement VAP bundle  CARDIOVASCULAR A:  Shock, sedation related vs cardiogenic Atrial fibrillation Volume overload with no known history CHF H/o HTN  P:  Telemetry MAP goal 65mmHg Phenylephrine for MAP goal Wean dopamine to off as able DC IVF as volume overlaoded on exam and has received multiple boluses in ED Echo Heparin for afib > holding warfarin Given lasix in ED, holing further diuresis with soft BP  RENAL A:   No acute issues  P:   Follow BMP and urine output  GASTROINTESTINAL A:   Morbid obesity  P:   NPO Protonix for  SUP  HEMATOLOGIC A:   Anemia: no obvious bleeding source  P:  Follow CBC SQ heparin for VTE ppx  INFECTIOUS A:   Leukopenia SIRS without clear infectious source  P:   Culture blood, urine Defer ABX for now Assess PCT  ENDOCRINE A:   History of goiter    P:   Assess TSH  NEUROLOGIC A:   Acute metabolic encephalopathy > improved P:   RASS goal: 0 to -1 Fentanyl infusion PRN versed  FAMILY  - Updates: patient awake, alert and updated. [facility]  - Inter-disciplinary family meet or Palliative Care meeting due by:  3/11   Joneen RoachPaul Hoffman, AGACNP-BC [place] [facility]/Critical Care Pager [phone] or [phone]  05/09/2016 1:55 AM

## 2016-05-09 NOTE — Progress Notes (Signed)
  Echocardiogram 2D Echocardiogram with definity has been performed.  Leta JunglingCooper, Bartholomew Ramesh M 05/09/2016, 3:26 PM

## 2016-05-09 NOTE — Progress Notes (Signed)
eLink Physician-Brief Progress Note Patient Name: Omar FellingWilliam R [name] DOB: 09-16-1945 MRN: [id number]   Date of Service  05/09/2016  HPI/Events of Note  Neo can go to 200  eICU Interventions       Intervention Category Major Interventions: Hypotension - evaluation and management  Nelda BucksFEINSTEIN,DANIEL J. 05/09/2016, 10:45 PM

## 2016-05-09 NOTE — Progress Notes (Signed)
ANTICOAGULATION CONSULT NOTE - Initial Consult  Pharmacy Consult for Heparin (Warfarin on hold) Indication: atrial fibrillation  Allergies  Allergen Reactions  . Anacin Af [Acetaminophen]   . Niaspan [Niacin Er]     Patient Measurements: Height: 5\' 6"  (167.6 cm) Weight: (!) 343 lb 0.6 oz (155.6 kg) IBW/kg (Calculated) : 63.8  Vital Signs: Temp: 97.9 F (36.6 C) (03/06 0200) Temp Source: Oral (03/06 0120) BP: 88/40 (03/06 0200) Pulse Rate: 97 (03/06 0200)  Labs:  Recent Labs  2017-01-23 1736  HGB 11.1*  HCT 35.9*  PLT 137*  LABPROT 26.3*  INR 2.37  CREATININE 1.19  TROPONINI <0.03    Estimated Creatinine Clearance: 79.8 mL/min (by C-G formula based on SCr of 1.19 mg/dL).   Medical History: Past Medical History:  Diagnosis Date  . Asthma   . Atrial fibrillation (HCC)   . Gout   . Hypertension   . Morbid obesity (HCC)   . Multinodular goiter   . Osteoarthrosis and allied disorders   . Other and unspecified hyperlipidemia   . Stroke [facility](HCC)    right sided hemiparesis  . Thrombocytopenia (HCC)   . Vitamin D deficiency     Assessment: 71 y/o M here with weakness, altered mental status requiring intubation, starting heparin while warfarin on hold for afib, INR is 2.37, Hgb 11.1, Scr 1.19.   Goal of Therapy:  Heparin level 0.3-0.7 units/ml Monitor platelets by anticoagulation protocol: Yes   Plan:  -Start heparin when INR <2  Abran DukeLedford, Unique Sillas 05/09/2016,2:14 AM

## 2016-05-09 NOTE — ED Notes (Signed)
Family called and advised pt leaving [facility].

## 2016-05-09 NOTE — Progress Notes (Signed)
  Echocardiogram 2D Echocardiogram with definity has been performed.  Leta JunglingCooper, Jahnyla Parrillo M 05/09/2016, 3:35 PM

## 2016-05-09 NOTE — Progress Notes (Signed)
eLink Physician-Brief Progress Note Patient Name: Ignacia FellingWilliam R Skeet DOB: [birth year] MRN: [id number]   Date of Service  05/09/2016  HPI/Events of Note    eICU Interventions  MgSO4 ordered     Intervention Category Intermediate Interventions: Electrolyte abnormality - evaluation and management  Zenab Gronewold S. 05/09/2016, 5:09 AM

## 2016-05-09 NOTE — Progress Notes (Signed)
Critical ABG results called to Joneen RoachPaul Hoffman, NP  PH 7.22 CO2 68.6 O2 98.3 HCO3 27.2 SPO2 96.1%

## 2016-05-10 ENCOUNTER — Inpatient Hospital Stay (HOSPITAL_COMMUNITY): Payer: Medicare Other

## 2016-05-10 ENCOUNTER — Telehealth: Payer: Self-pay | Admitting: Family Medicine

## 2016-05-10 DIAGNOSIS — R579 Shock, unspecified: Secondary | ICD-10-CM

## 2016-05-10 LAB — BLOOD GAS, ARTERIAL
ACID-BASE EXCESS: 0.1 mmol/L (ref 0.0–2.0)
Acid-Base Excess: 3 mmol/L — ABNORMAL HIGH (ref 0.0–2.0)
Bicarbonate: 27.2 mmol/L (ref 20.0–28.0)
Bicarbonate: 27 mmol/L (ref 20.0–28.0)
DRAWN BY: 345601
DRAWN BY: 10370
FIO2: 40
FIO2: 40
MECHVT: 540 mL
O2 SAT: 95.8 %
O2 Saturation: 93.2 %
PATIENT TEMPERATURE: 98.9
PCO2 ART: 43.7 mmHg (ref 32.0–48.0)
PEEP/CPAP: 5 cmH2O
PEEP: 8 cmH2O
PH ART: 7.412 (ref 7.350–7.450)
PH ART: 7.193 — AB (ref 7.350–7.450)
Patient temperature: 101.7
RATE: 26 resp/min
RATE: 14 resp/min
VT: 540 mL
pCO2 arterial: 75.1 mmHg (ref 32.0–48.0)
pO2, Arterial: 84 mmHg (ref 83.0–108.0)
pO2, Arterial: 88.5 mmHg (ref 83.0–108.0)

## 2016-05-10 LAB — GLUCOSE, CAPILLARY
GLUCOSE-CAPILLARY: 106 mg/dL — AB (ref 65–99)
GLUCOSE-CAPILLARY: 91 mg/dL (ref 65–99)
GLUCOSE-CAPILLARY: 96 mg/dL (ref 65–99)
Glucose-Capillary: 114 mg/dL — ABNORMAL HIGH (ref 65–99)
Glucose-Capillary: 87 mg/dL (ref 65–99)
Glucose-Capillary: 91 mg/dL (ref 65–99)
Glucose-Capillary: 92 mg/dL (ref 65–99)

## 2016-05-10 LAB — POCT I-STAT 3, ART BLOOD GAS (G3+)
Bicarbonate: 26.8 mmol/L (ref 20.0–28.0)
O2 SAT: 94 %
PCO2 ART: 54.9 mmHg — AB (ref 32.0–48.0)
TCO2: 28 mmol/L (ref 0–100)
pH, Arterial: 7.298 — ABNORMAL LOW (ref 7.350–7.450)
pO2, Arterial: 79 mmHg — ABNORMAL LOW (ref 83.0–108.0)

## 2016-05-10 LAB — BASIC METABOLIC PANEL
ANION GAP: 5 (ref 5–15)
BUN: 34 mg/dL — ABNORMAL HIGH (ref 6–20)
CHLORIDE: 110 mmol/L (ref 101–111)
CO2: 26 mmol/L (ref 22–32)
Calcium: 8.8 mg/dL — ABNORMAL LOW (ref 8.9–10.3)
Creatinine, Ser: 1.01 mg/dL (ref 0.61–1.24)
GFR calc non Af Amer: >60 mL/min (ref >60)
Glucose, Bld: 98 mg/dL (ref 65–99)
Potassium: 3.7 mmol/L (ref 3.5–5.1)
Sodium: 141 mmol/L (ref 135–145)

## 2016-05-10 LAB — CBC
HEMATOCRIT: 34.3 % — AB (ref 39.0–52.0)
HEMOGLOBIN: 10.6 g/dL — AB (ref 13.0–17.0)
MCH: 31.5 pg (ref 26.0–34.0)
MCHC: 30.9 g/dL (ref 30.0–36.0)
MCV: 102.1 fL — AB (ref 78.0–100.0)
Platelets: 148 10*3/uL — ABNORMAL LOW (ref 150–400)
RBC: 3.36 MIL/uL — AB (ref 4.22–5.81)
RDW: 16.2 % — ABNORMAL HIGH (ref 11.5–15.5)
WBC: 6.9 10*3/uL (ref 4.0–10.5)

## 2016-05-10 LAB — PHOSPHORUS
PHOSPHORUS: 4.2 mg/dL (ref 2.5–4.6)
Phosphorus: 3 mg/dL (ref 2.5–4.6)

## 2016-05-10 LAB — CULTURE, RESPIRATORY

## 2016-05-10 LAB — URINE CULTURE: CULTURE: NO GROWTH

## 2016-05-10 LAB — ABO/RH: ABO/RH(D): B POS

## 2016-05-10 LAB — MAGNESIUM
MAGNESIUM: 1.9 mg/dL (ref 1.7–2.4)
Magnesium: 1.8 mg/dL (ref 1.7–2.4)

## 2016-05-10 LAB — PROTIME-INR
INR: 2.36
Prothrombin Time: 26.2 seconds — ABNORMAL HIGH (ref 11.4–15.2)

## 2016-05-10 MED ORDER — IBUPROFEN 100 MG/5ML PO SUSP
400.0000 mg | Freq: Four times a day (QID) | ORAL | Status: DC | PRN
  Administered 2016-05-10: 400 mg via ORAL
  Filled 2016-05-10 (×2): qty 20

## 2016-05-10 MED ORDER — MIDAZOLAM HCL 2 MG/2ML IJ SOLN
2.0000 mg | Freq: Once | INTRAMUSCULAR | Status: AC
  Administered 2016-05-10: 2 mg via INTRAVENOUS

## 2016-05-10 MED ORDER — NOREPINEPHRINE BITARTRATE 1 MG/ML IV SOLN
0.0000 ug/min | INTRAVENOUS | Status: DC
  Administered 2016-05-10: 5 ug/min via INTRAVENOUS
  Filled 2016-05-10 (×2): qty 4

## 2016-05-10 MED ORDER — SODIUM CHLORIDE 0.9 % IV SOLN
Freq: Once | INTRAVENOUS | Status: AC
  Administered 2016-05-10: 21:00:00 via INTRAVENOUS

## 2016-05-10 NOTE — Progress Notes (Signed)
eLink Physician-Brief Progress Note Patient Name: Omar FellingWilliam R [name] DOB: 05-26-1945 MRN: [id number]   Date of Service  05/10/2016  HPI/Events of Note  Much less responsive then prior on fen tdrip  STAT ABG CT head if ABG wnl  eICU Interventions       Intervention Category Major Interventions: Change in mental status - evaluation and management  Tycho Cheramie J. 05/10/2016, 5:16 PM

## 2016-05-10 NOTE — Procedures (Signed)
Central Venous Catheter Insertion Procedure Note Ignacia FellingWilliam R Senteno [id number] [birth year]  Procedure: Insertion of Central Venous Catheter Indications: Assessment of intravascular volume, Drug and/or fluid administration and Frequent blood sampling  Procedure Details Consent: Risks of procedure as well as the alternatives and risks of each were explained to the (patient/caregiver).  Consent for procedure obtained. Time Out: Verified patient identification, verified procedure, site/side was marked, verified correct patient position, special equipment/implants available, medications/allergies/relevent history reviewed, required imaging and test results available.  Performed  Maximum sterile technique was used including antiseptics, cap, gloves, gown, hand hygiene, mask and sheet. Skin prep: Chlorhexidine; local anesthetic administered A antimicrobial bonded/coated triple lumen catheter was placed in the left internal jugular vein using the Seldinger technique. Ultrasound guidance used.Yes.   Catheter placed to 22 cm. Blood aspirated via all 3 ports and then flushed x 3. Line sutured x 2 and dressing applied.  Evaluation Blood flow good Complications: No apparent complications Patient did tolerate procedure well. Chest X-ray ordered to verify placement.  CXR: pending.  Joneen RoachPaul Silver Achey, AGACNP-BC [facility]/Critical Care Pager [phone] or [phone]  05/10/2016 11:10 PM

## 2016-05-10 NOTE — Progress Notes (Signed)
ANTICOAGULATION CONSULT NOTE - Follow Up Consult  Pharmacy Consult for Heparin (warfarin on hold) Indication: atrial fibrillation  Allergies  Allergen Reactions  . Anacin Af [Acetaminophen]   . Niaspan [Niacin Er]     Patient Measurements: Height: 5\' 6"  (167.6 cm) Weight: (!) 332 lb (150.6 kg) IBW/kg (Calculated) : 63.8 Heparin Dosing Weight: 102.5 kg  Vital Signs: Temp: 99 F (37.2 C) (03/07 0700) BP: 89/56 (03/07 0752) Pulse Rate: 73 (03/07 0752)  Labs:  Recent Labs  05/29/2016 1736 05/09/16 0408 05/10/16 0548  HGB 11.1* 11.6* 10.6*  HCT 35.9* 37.6* 34.3*  PLT 137* 130* 148*  APTT  --  39*  --   LABPROT 26.3* 26.7* 26.2*  INR 2.37 2.41 2.36  CREATININE 1.19 1.04 1.01  TROPONINI <0.03  --   --     Estimated Creatinine Clearance: 92.1 mL/min (by C-G formula based on SCr of 1.01 mg/dL).  Assessment: 71 year old male on chronic warfarin prior to admission for atrial fibrillation. Warfarin is currently on hold and consult ordered for pharmacy to initiate IV heparin when INR is < 2.   Current INR is 2.36 (slow trend down). H/H stable, Plts 148. No bleeding noted.   Goal of Therapy:  Heparin level 0.3-0.7 units/ml Monitor platelets by anticoagulation protocol: Yes   Plan:  -Continue to hold heparin until INR <2.   Link SnufferJessica Zelta Enfield, PharmD, BCPS Clinical Pharmacist Clinical phone 05/10/2016 until 3:30 PM -#[phone] After hours, please call #[phone] 05/10/2016,8:55 AM

## 2016-05-10 NOTE — Telephone Encounter (Signed)
Pt grandaughter called with update - she is aware [facility]

## 2016-05-10 NOTE — Progress Notes (Signed)
Pt placed back on full support due to decreased pt effort, VE <3.0

## 2016-05-10 NOTE — Progress Notes (Signed)
RT called to patient room due to patient needing tube holder to be changed.  ETT was noted to be 22 at the lip, has been charted at 28 at the lip, and ETT holder was down at patient's chin.  Advanced ETT back to 28 at the lip and changed ETT holder, with assistance of RN, without complications.  Patient tolerated well.  Will continue to monitor.

## 2016-05-10 NOTE — Progress Notes (Signed)
PCCM Progress Note  Admission date: 05/07/2016 Referring provider: Dr. Clarene Duke, [facility] ER  CC: altered mental status  HPI: 71 yo male from [facility] with altered mental status with aspiration, sepsis, hypoxic/hypercapnic respiratory failure.  Subjective: More alert  Vital signs: BP (!) 101/57   Pulse 78   Temp 99.1 F (37.3 C)   Resp (!) 26   Ht 5\' 6"  (1.676 m)   Wt (!) 332 lb (150.6 kg)   SpO2 97%   BMI 53.59 kg/m   Intake/output: I/O last 3 completed shifts: In: 6121.5 [I.V.:2428.5; NG/GT:743; IV Piggyback:2950] Out: 1915 [Urine:1915]  General: alert Neuro: RASS 0, follows commands HEENT: ETT in place Cardiac: irregular, no murmur Chest: no wheeze, basilar crackles Abd: obese, soft, non tender Ext: 2+ edema Skin: no rashes   CMP Latest Ref Rng & Units 05/10/2016 05/09/2016 05/13/2016  Glucose 65 - 99 mg/dL 98 562(Z) 308(M)  BUN 6 - 20 mg/dL 57(Q) 46(N) 62(X)  Creatinine 0.61 - 1.24 mg/dL 5.28 4.13 2.44  Sodium 135 - 145 mmol/L 141 140 138  Potassium 3.5 - 5.1 mmol/L 3.7 4.4 4.2  Chloride 101 - 111 mmol/L 110 109 105  CO2 22 - 32 mmol/L 26 27 31   Calcium 8.9 - 10.3 mg/dL 0.1(U) 2.7(O) 5.3(G)  Total Protein 6.5 - 8.1 g/dL - - 8.5(H)  Total Bilirubin 0.3 - 1.2 mg/dL - - 0.9  Alkaline Phos 38 - 126 U/L - - 71  AST 15 - 41 U/L - - 20  ALT 17 - 63 U/L - - 13(L)     CBC Latest Ref Rng & Units 05/10/2016 05/09/2016 05/16/2016  WBC 4.0 - 10.5 K/uL 6.9 7.0 3.9(L)  Hemoglobin 13.0 - 17.0 g/dL 10.6(L) 11.6(L) 11.1(L)  Hematocrit 39.0 - 52.0 % 34.3(L) 37.6(L) 35.9(L)  Platelets 150 - 400 K/uL 148(L) 130(L) 137(L)     ABG    Component Value Date/Time   PHART 7.412 05/10/2016 0313   PCO2ART 43.7 05/10/2016 0313   PO2ART 84.0 05/10/2016 0313   HCO3 27.2 05/10/2016 0313   O2SAT 95.8 05/10/2016 0313     CBG (last 3)   Recent Labs  05/10/16 0008 05/10/16 0428 05/10/16 0723  GLUCAP 92 91 87     Imaging: Ct Head Wo Contrast  Result Date: 06/01/2016 CLINICAL DATA:   71 year old male with altered mental status, intubated. Remote infarct with right side neurologic deficits. Initial encounter. EXAM: CT HEAD WITHOUT CONTRAST TECHNIQUE: Contiguous axial images were obtained from the base of the skull through the vertex without intravenous contrast. COMPARISON:  [facility] Head CTs without contrast 05/19/2010 and earlier. FINDINGS: Brain: No ventriculomegaly. No acute intracranial hemorrhage identified. No midline shift, mass effect, or evidence of intracranial mass lesion. 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 Elsewhere there is minimal to mild nonspecific white matter hypodensity. No superimposed acute cortically based infarct identified. Vascular: Calcified atherosclerosis at the skull base. No suspicious intracranial vascular hyperdensity. Skull: No acute osseous abnormality identified. Sinuses/Orbits: Visualized paranasal sinuses and mastoids are stable and well pneumatized. There is fluid in the nasal cavity and visible pharynx in the setting of intubation. Other: No acute scalp soft tissue findings. 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  These orbital findings are only mildly progressed since 2012. The globes appear intact. No intraorbital inflammation. IMPRESSION: 1.  No acute intracranial abnormality. 2. 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 3. 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 Electronically Signed   By: Odessa FlemingH  Hall M.D.   On: 06/02/2016 20:37   Dg Chest Port 1 View  Result Date: 05/10/2016 CLINICAL DATA:  Respiratory  failure and shortness of Breath EXAM: PORTABLE CHEST 1 VIEW COMPARISON:  05/09/2016 FINDINGS: Cardiac shadow remains enlarged. Endotracheal tube and nasogastric catheter are again seen in satisfactory position. Bilateral pleural effusions are seen as well as diffuse vascular congestion. No focal confluent infiltrate is seen. IMPRESSION: Bilateral pleural effusions and changes of mild CHF. Electronically Signed   By: Alcide CleverMark  Lukens M.D.   On: 05/10/2016 07:24   Dg Chest Port 1 View  Result Date: 05/09/2016 CLINICAL DATA:  Evaluate for endotracheal tube placement after transport. EXAM: PORTABLE CHEST 1 VIEW COMPARISON:  05/25/2016 at 19:18 FINDINGS: Endotracheal tube tip is 2.2 cm above the carina. Nasogastric tube extends below the diaphragm and beyond the inferior edge of the image. Central airspace opacities persist bilaterally. Right pleural effusion persists. No pneumothorax. IMPRESSION: Support equipment appears satisfactorily positioned. No significant interval change in the bilateral airspace opacities. Electronically Signed   By: Ellery Plunkaniel R Mitchell M.D.   On: 05/09/2016 02:10   Dg Chest Port 1 View  Result Date: 06/01/2016 CLINICAL DATA:  ET tube placement EXAM: PORTABLE CHEST 1 VIEW COMPARISON:  05/23/2016 FINDINGS: Interval intubation, the tip of the endotracheal tube is approximately 2 cm superior to the carina. Esophageal tube tip is not well visualize, tubing extends toward the diaphragm. No significant interval change in suspected pleural effusions and hazy edema or infiltrates in the lower lung zones. Stable cardiomegaly with central congestion. IMPRESSION: 1. Tip of the endotracheal tube is approximately 2 cm superior to the carina, esophageal tube courses toward the diaphragm but the tip is not included 2. No significant interval change in probable bilateral effusions, cardiomegaly, central vascular congestion and diffuse pulmonary edema. Electronically Signed   By: Jasmine PangKim  Fujinaga M.D.   On:  06/02/2016 19:34   Dg Chest Port 1 View  Result Date: 05/22/2016 CLINICAL DATA:  Hypoxia.  History of asthma. EXAM: PORTABLE CHEST 1 VIEW COMPARISON:  Chest radiograph May 05, 2016 FINDINGS: Cardiac silhouette is moderate to severely enlarged. Mediastinal silhouette is nonsuspicious. Increasing interstitial and alveolar airspace opacities. Probable LEFT pleural effusion though LEFT costophrenic angle incompletely imaged. No pneumothorax. Tube in projects over LEFT chest, likely external to the patient. IMPRESSION: Stable cardiomegaly. Worsening interstitial and alveolar airspace opacities, small suspected LEFT pleural effusion. Constellation of findings most consistent with CHF. Electronically Signed   By: Awilda Metroourtnay  Bloomer M.D.   On: 05/18/2016 17:42   Dg Abd Portable 1v  Result Date: 05/09/2016 CLINICAL DATA:  71 y/o  M; adjustment of enteric tube. EXAM: PORTABLE ABDOMEN - 1 VIEW COMPARISON:  05/09/2016 abdomen radiograph. FINDINGS: Normal bowel gas pattern. Enteric tube tip has advanced to the mid stomach. IMPRESSION: Enteric tube tip has advanced to mid stomach. Electronically Signed   By: Mitzi HansenLance  Furusawa-Stratton M.D.   On: 05/09/2016 16:06   Dg Abd Portable 1v  Result Date: 05/09/2016 CLINICAL DATA:  Post OG tube placement EXAM: PORTABLE ABDOMEN - 1 VIEW COMPARISON:  None. FINDINGS: Orogastric tube with the tip at the gastroesophageal junction. There is no bowel dilatation to suggest  obstruction. There is no evidence of pneumoperitoneum, portal venous gas or pneumatosis. There are no pathologic calcifications along the expected course of the ureters. The osseous structures are unremarkable. IMPRESSION: Orogastric tube with the tip at the gastroesophageal junction. Recommend advancing the orogastric tube 15 cm. Electronically Signed   By: Elige Ko   On: 05/09/2016 15:51   Dg Abd Portable 1v  Result Date: 05/09/2016 CLINICAL DATA:  71 year old male with attempted advancement of enteric tube  since the 1219 hour film today. EXAM: PORTABLE ABDOMEN - 1 VIEW COMPARISON:  Portable views of the abdomen at 1219 hours today. FINDINGS: Portable AP supine view at 1241 hours. Unchanged configuration of the enteric tube located at the junction of the chest and abdomen. The side hole appears to be at the level of the distal esophagus and unchanged from the earlier film. The tip of the tube is at the level of the gastric cardia. If the tube was advanced then the extra length probably coiled in the patient's pharynx. Visible bowel gas pattern remains normal. Stable visible lung bases. IMPRESSION: Unchanged location of the enteric tube with side hole at the level of the distal thoracic esophagus. I am advised that the tube was advanced since the prior film - and thus the extra length probably coiled in the patient's pharynx. This was discussed by telephone with the inpatient floor at the time of the study. I advised against tube feeds at this time, and was told they will attempt a completely new tube later. Electronically Signed   By: Odessa Fleming M.D.   On: 05/09/2016 13:53   Dg Abd Portable 1v  Result Date: 05/09/2016 CLINICAL DATA:  Orogastric tube placement. EXAM: PORTABLE ABDOMEN - 1 VIEW COMPARISON:  None. FINDINGS: Examination is limited by patient body habitus. An enteric tube projects over the distal esophagus with tip likely near the GE junction. Gas is present in grossly nondilated loops of small and large bowel in the visualized portion of the abdomen. Cardiomegaly and bibasilar lung airspace opacities are more fully evaluated on today's earlier chest radiograph. IMPRESSION: Enteric tube overlying the distal esophagus as above. Recommend advancing approximately 15 cm to place the tip and side hole well within the stomach. Electronically Signed   By: Sebastian Ache M.D.   On: 05/09/2016 13:38     Studies: Echo 3/06 >> EF 65 to 70%, mod LVH, mod TR, PAS 39 mmHg  Antibiotics: Zosyn 3/06  >>  Cultures: Urine 3/05 >> negative Blood 3/06 >> Sputum 3/06 >> GNR >>   Lines/tubes: ETT 3/05 >>   Events: 3/05 Transfer to [facility]  Summary: 71 yo male with VDRF, sepsis from aspiration pneumonia.  Assessment/plan:  Acute on chronic hypoxic/hypercapnic respiratory failure from aspiration pneumonia. OSA, OHS. - pressure support wean >> not ready for extubation - f/u CXR  Septic shock 2nd to aspiration pneumonia. - pressors to keep MAP > 65 - day 2 of zosyn  Hx of a fib, HLD, HTN. - heparin gtt when INR < 2 - continue lipitor - hold outpt norvasc, lasix, cozaar, lopressor  Anemia of critical illness. - f/u CBC  Hx of hypothyroidism, gout. - hold outpt allopurinol - synthroid  DVT prophylaxis - heparin gtt when INR < 2 SUP - protonix Nutrition - tube feeds Goals of care - Full code  CC time 32 minutes  Coralyn Helling, MD [facility] 05/10/2016, 9:36 AM Pager:  [phone] After 3pm call: [phone]

## 2016-05-10 NOTE — Progress Notes (Signed)
eLink Physician-Brief Progress Note Patient Name: Omar FellingWilliam R Sliker DOB: 04-13-1945 MRN: [id number]   Date of Service  05/10/2016  HPI/Events of Note  Fever Recent BC done  tylneal allergy not specified well  Add ibuprofen total 3 does prn  eICU Interventions       Intervention Category Minor Interventions: Routine modifications to care plan (e.g. PRN medications for pain, fever)  Nelda BucksFEINSTEIN,Bryar Dahms J. 05/10/2016, 4:25 PM

## 2016-05-10 NOTE — Care Management Note (Signed)
Case Management Note  Patient Details  Name: Omar [name] MRN: [id number] Date of Birth: Oct 12, 1945  Subjective/Objective: Pt admitted for acute resp failure - on ventilator                   Action/Plan:  PTA from home with daughter and grandkids.  Pt is wheelchair bound at home.  CM will continue to follow for discharge needs   Expected Discharge Date:                  Expected Discharge Plan:  Home/Self Care  In-House Referral:     Discharge planning Services  CM Consult  Post Acute Care Choice:    Choice offered to:     DME Arranged:    DME Agency:     HH Arranged:    HH Agency:     Status of Service:  In process, will continue to follow  If discussed at Long Length of Stay Meetings, dates discussed:    Additional Comments:  Omar [name], Omar Mccathern S, RN 05/10/2016, 2:18 PM

## 2016-05-10 NOTE — Progress Notes (Signed)
eLink Physician-Brief Progress Note Patient Name: Omar FellingWilliam R [name] DOB: Nov 07, 1945 MRN: [id number]   Date of Service  05/10/2016  HPI/Events of Note  Neo maxed Add levo Transition off neo Place line  eICU Interventions       Intervention Category Major Interventions: Hypotension - evaluation and management  Nelda BucksFEINSTEIN,Janara Klett J. 05/10/2016, 7:28 PM

## 2016-05-10 NOTE — Progress Notes (Signed)
eLink Physician-Brief Progress Note Patient Name: Omar FellingWilliam R [name] DOB: 08/25/1945 MRN: [id number]   Date of Service  05/10/2016  HPI/Events of Note  abg noted  eICU Interventions  Rate increase abg repeat     Intervention Category Major Interventions: Acid-Base disturbance - evaluation and management  Nelda BucksFEINSTEIN,Sopheap Basic J. 05/10/2016, 6:04 PM

## 2016-05-11 ENCOUNTER — Inpatient Hospital Stay (HOSPITAL_COMMUNITY): Payer: Medicare Other

## 2016-05-11 DIAGNOSIS — J14 Pneumonia due to Hemophilus influenzae: Secondary | ICD-10-CM

## 2016-05-11 LAB — CBC
HCT: 30.7 % — ABNORMAL LOW (ref 39.0–52.0)
Hemoglobin: 9.3 g/dL — ABNORMAL LOW (ref 13.0–17.0)
MCH: 31.7 pg (ref 26.0–34.0)
MCHC: 30.3 g/dL (ref 30.0–36.0)
MCV: 104.8 fL — ABNORMAL HIGH (ref 78.0–100.0)
Platelets: 121 10*3/uL — ABNORMAL LOW (ref 150–400)
RBC: 2.93 MIL/uL — AB (ref 4.22–5.81)
RDW: 16.6 % — ABNORMAL HIGH (ref 11.5–15.5)
WBC: 5.5 10*3/uL (ref 4.0–10.5)

## 2016-05-11 LAB — BLOOD GAS, ARTERIAL
ACID-BASE EXCESS: 1.2 mmol/L (ref 0.0–2.0)
ACID-BASE EXCESS: 1.3 mmol/L (ref 0.0–2.0)
BICARBONATE: 27.6 mmol/L (ref 20.0–28.0)
BICARBONATE: 28 mmol/L (ref 20.0–28.0)
DELIVERY SYSTEMS: POSITIVE
DRAWN BY: 313941
Drawn by: 313941
Expiratory PAP: 6
FIO2: 40
Inspiratory PAP: 16
O2 CONTENT: 2 L/min
O2 SAT: 86.6 %
O2 Saturation: 95 %
PH ART: 7.239 — AB (ref 7.350–7.450)
PO2 ART: 65.7 mmHg — AB (ref 83.0–108.0)
Patient temperature: 102.2
Patient temperature: 98.6
RATE: 15 resp/min
pCO2 arterial: 70.7 mmHg (ref 32.0–48.0)
pCO2 arterial: 67.8 mmHg (ref 32.0–48.0)
pH, Arterial: 7.229 — ABNORMAL LOW (ref 7.350–7.450)
pO2, Arterial: 81.9 mmHg — ABNORMAL LOW (ref 83.0–108.0)

## 2016-05-11 LAB — BASIC METABOLIC PANEL
ANION GAP: 6 (ref 5–15)
BUN: 41 mg/dL — ABNORMAL HIGH (ref 6–20)
CALCIUM: 8.3 mg/dL — AB (ref 8.9–10.3)
CO2: 27 mmol/L (ref 22–32)
Chloride: 109 mmol/L (ref 101–111)
Creatinine, Ser: 1.32 mg/dL — ABNORMAL HIGH (ref 0.61–1.24)
GFR, EST NON AFRICAN AMERICAN: 52 mL/min — AB (ref >60)
Glucose, Bld: 111 mg/dL — ABNORMAL HIGH (ref 65–99)
Potassium: 3.3 mmol/L — ABNORMAL LOW (ref 3.5–5.1)
SODIUM: 142 mmol/L (ref 135–145)

## 2016-05-11 LAB — HEPARIN LEVEL (UNFRACTIONATED): HEPARIN UNFRACTIONATED: 0.31 [IU]/mL (ref 0.30–0.70)

## 2016-05-11 LAB — PREPARE FRESH FROZEN PLASMA
Unit division: 0
Unit division: 0

## 2016-05-11 LAB — BPAM FFP
Blood Product Expiration Date: 201803122359
Blood Product Expiration Date: 201803122359
ISSUE DATE / TIME: 201803072050
ISSUE DATE / TIME: 201803072147
UNIT TYPE AND RH: 7300
Unit Type and Rh: 7300

## 2016-05-11 LAB — GLUCOSE, CAPILLARY
GLUCOSE-CAPILLARY: 81 mg/dL (ref 65–99)
Glucose-Capillary: 108 mg/dL — ABNORMAL HIGH (ref 65–99)
Glucose-Capillary: 100 mg/dL — ABNORMAL HIGH (ref 65–99)
Glucose-Capillary: 80 mg/dL (ref 65–99)

## 2016-05-11 LAB — PROTIME-INR
INR: 1.98
PROTHROMBIN TIME: 22.8 s — AB (ref 11.4–15.2)

## 2016-05-11 LAB — PROCALCITONIN: Procalcitonin: 0.98 ng/mL

## 2016-05-11 MED ORDER — POTASSIUM CHLORIDE 20 MEQ/15ML (10%) PO SOLN
40.0000 meq | Freq: Once | ORAL | Status: AC
  Administered 2016-05-11: 40 meq
  Filled 2016-05-11: qty 30

## 2016-05-11 MED ORDER — DEXTROSE 5 % IV SOLN
1.0000 g | INTRAVENOUS | Status: DC
  Administered 2016-05-11 – 2016-05-15 (×5): 1 g via INTRAVENOUS
  Filled 2016-05-11 (×5): qty 10

## 2016-05-11 MED ORDER — LEVOTHYROXINE SODIUM 100 MCG IV SOLR
62.5000 ug | Freq: Every day | INTRAVENOUS | Status: DC
  Administered 2016-05-12 – 2016-05-17 (×6): 62.5 ug via INTRAVENOUS
  Filled 2016-05-11 (×7): qty 5

## 2016-05-11 MED ORDER — METOPROLOL TARTRATE 5 MG/5ML IV SOLN
5.0000 mg | Freq: Once | INTRAVENOUS | Status: AC
  Administered 2016-05-11: 5 mg via INTRAVENOUS
  Filled 2016-05-11: qty 5

## 2016-05-11 MED ORDER — ATORVASTATIN CALCIUM 20 MG PO TABS
20.0000 mg | ORAL_TABLET | Freq: Every day | ORAL | Status: DC
  Administered 2016-05-13 – 2016-05-15 (×2): 20 mg via ORAL
  Filled 2016-05-11 (×6): qty 1

## 2016-05-11 MED ORDER — HEPARIN (PORCINE) IN NACL 100-0.45 UNIT/ML-% IJ SOLN
1450.0000 [IU]/h | INTRAMUSCULAR | Status: DC
  Administered 2016-05-11: 1550 [IU]/h via INTRAVENOUS
  Administered 2016-05-11: 1450 [IU]/h via INTRAVENOUS
  Administered 2016-05-12: 1500 [IU]/h via INTRAVENOUS
  Administered 2016-05-13: 1450 [IU]/h via INTRAVENOUS
  Filled 2016-05-11 (×7): qty 250

## 2016-05-11 MED ORDER — RACEPINEPHRINE HCL 2.25 % IN NEBU
INHALATION_SOLUTION | RESPIRATORY_TRACT | Status: AC
  Filled 2016-05-11: qty 0.5

## 2016-05-11 MED ORDER — PANTOPRAZOLE SODIUM 40 MG IV SOLR
40.0000 mg | INTRAVENOUS | Status: DC
  Administered 2016-05-11 – 2016-05-17 (×7): 40 mg via INTRAVENOUS
  Filled 2016-05-11 (×6): qty 40

## 2016-05-11 MED ORDER — METOPROLOL TARTRATE 25 MG/10 ML ORAL SUSPENSION
50.0000 mg | Freq: Two times a day (BID) | ORAL | Status: DC
  Administered 2016-05-11: 50 mg
  Filled 2016-05-11 (×3): qty 20

## 2016-05-11 NOTE — Progress Notes (Signed)
ANTICOAGULATION CONSULT NOTE - Follow Up Consult  Pharmacy Consult for Heparin (warfarin on hold) Indication: atrial fibrillation  Allergies  Allergen Reactions  . Anacin Af [Acetaminophen]   . Niaspan [Niacin Er]    Patient Measurements: Height: 5\' 6"  (167.6 cm) Weight: (!) 345 lb (156.5 kg) IBW/kg (Calculated) : 63.8 Heparin Dosing Weight: 102.5 kg  Vital Signs: Temp: 102.4 F (39.1 C) (03/08 1800) Temp Source: Core (Comment) (03/08 1200) BP: 98/59 (03/08 1800) Pulse Rate: 78 (03/08 1800)  Labs:  Recent Labs  05/09/16 0408 05/10/16 0548 05/11/16 0400 05/11/16 1700  HGB 11.6* 10.6* 9.3*  --   HCT 37.6* 34.3* 30.7*  --   PLT 130* 148* 121*  --   APTT 39*  --   --   --   LABPROT 26.7* 26.2* 22.8*  --   INR 2.41 2.36 1.98  --   HEPARINUNFRC  --   --   --  0.31  CREATININE 1.04 1.01 1.32*  --     Estimated Creatinine Clearance: 72.2 mL/min (by C-G formula based on SCr of 1.32 mg/dL (H)).  Assessment: 71 year old male on chronic warfarin prior to admission for atrial fibrillation. Warfarin is currently on hold and consult ordered for pharmacy to initiate IV heparin when INR is < 2.   Current INR is 1.98. H/H trend down, Plts 148>>121. No bleeding or infusion related issues reported.   Heparin level therapeutic: 0.31 - will increase rate slightly to keep level within goal range  Goal of Therapy:  Heparin level 0.3-0.7 units/ml Monitor platelets by anticoagulation protocol: Yes   Plan:  -Increase heparin gtt to 1550 units/hr. -Daily heparin level and CBC while on therapy.  -Monitor for signs and symptoms of bleeding.   Ruben Imony Lizbeth Feijoo, PharmD Clinical Pharmacist Pager: [phone] 05/11/2016 6:29 PM

## 2016-05-11 NOTE — Progress Notes (Signed)
PCCM Progress Note  Admission date: 05/31/2016 Referring provider: Dr. Clarene Duke, [facility]  CC: altered mental status  HPI: 71 yo male from [facility] with altered mental status with aspiration, sepsis, hypoxic/hypercapnic respiratory failure.  Subjective: Hypotensive overnight and pressors changed.  Off pressors this AM.  Vital signs: BP 109/69   Pulse 82   Temp 99 F (37.2 C)   Resp 16   Ht 5\' 6"  (1.676 m)   Wt (!) 345 lb (156.5 kg)   SpO2 97%   BMI 55.68 kg/m   Intake/output: I/O last 3 completed shifts: In: 5693 [I.V.:3031; Blood:512; NG/GT:1900; IV Piggyback:250] Out: 2300 [Urine:2300]  General: alert Neuro: RASS 0, follows commands HEENT: ETT in place Cardiac: irregular, no murmur Chest: better air movement, no wheeze Abd: soft, non tender Ext: 2+ edema Skin: no rashes   CMP Latest Ref Rng & Units 05/11/2016 05/10/2016 05/09/2016  Glucose 65 - 99 mg/dL 161(W) 98 960(A)  BUN 6 - 20 mg/dL 54(U) 98(J) 19(J)  Creatinine 0.61 - 1.24 mg/dL 4.78(G) 9.56 2.13  Sodium 135 - 145 mmol/L 142 141 140  Potassium 3.5 - 5.1 mmol/L 3.3(L) 3.7 4.4  Chloride 101 - 111 mmol/L 109 110 109  CO2 22 - 32 mmol/L 27 26 27   Calcium 8.9 - 10.3 mg/dL 8.3(L) 8.8(L) 8.8(L)  Total Protein 6.5 - 8.1 g/dL - - -  Total Bilirubin 0.3 - 1.2 mg/dL - - -  Alkaline Phos 38 - 126 U/L - - -  AST 15 - 41 U/L - - -  ALT 17 - 63 U/L - - -     CBC Latest Ref Rng & Units 05/11/2016 05/10/2016 05/09/2016  WBC 4.0 - 10.5 K/uL 5.5 6.9 7.0  Hemoglobin 13.0 - 17.0 g/dL 0.8(M) 10.6(L) 11.6(L)  Hematocrit 39.0 - 52.0 % 30.7(L) 34.3(L) 37.6(L)  Platelets 150 - 400 K/uL 121(L) 148(L) 130(L)     ABG    Component Value Date/Time   PHART 7.298 (L) 05/10/2016 2011   PCO2ART 54.9 (H) 05/10/2016 2011   PO2ART 79.0 (L) 05/10/2016 2011   HCO3 26.8 05/10/2016 2011   TCO2 28 05/10/2016 2011   O2SAT 94.0 05/10/2016 2011     CBG (last 3)   Recent Labs  05/10/16 2350 05/11/16 0341 05/11/16 0745  GLUCAP 106* 108* 100*      Imaging: Ct Head Wo Contrast  Result Date: 05/10/2016 CLINICAL DATA:  Change in mental status. Patient unresponsive today. EXAM: CT HEAD WITHOUT CONTRAST TECHNIQUE: Contiguous axial images were obtained from the base of the skull through the vertex without intravenous contrast. COMPARISON:  None. FINDINGS: Brain: Mild atrophy and white matter changes are stable, within normal limits age. No acute infarct, hemorrhage, or mass lesion is present. The ventricles are of normal size. No significant extraaxial fluid collection is present. The brainstem and cerebellum are within normal limits. Vascular: No hyperdense vessel or unexpected calcification. Skull: Calvarium is within normal limits. No focal lytic or blastic lesions are present. Sinuses/Orbits: The paranasal sinuses and mastoid air cells are clear. 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 A left inferior ophthalmic vein varix is also stable. Other: The patient is intubated.  An NG tube is in place. IMPRESSION: 1. Stable mild atrophy and white matter disease. 2. No acute abnormality. 3. 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 Electronically Signed   By: Marin Roberts M.D.   On: 05/10/2016 19:06   Dg Chest Port 1 View  Result Date: 05/11/2016 CLINICAL DATA:  Respiratory failure EXAM: PORTABLE  CHEST 1 VIEW COMPARISON:  05/10/2016 FINDINGS: Endotracheal tube unchanged, 2 cm above the carina. Central line in the SVC unchanged. NG tube in place with the tip not visualized Cardiac enlargement. Congestive heart failure with edema shows mild interval improvement. Improved aeration of the lung bases with less atelectasis. Minimal pleural effusions have improved. IMPRESSION: Improving congestive heart failure and pulmonary edema. Electronically Signed   By: Marlan Palau M.D.   On: 05/11/2016 07:43   Dg Chest Port 1 View  Result Date: 05/10/2016 CLINICAL DATA:  Central line placement EXAM: PORTABLE CHEST 1 VIEW COMPARISON:   05/10/2016 FINDINGS: Endotracheal tube tip is approximately 1.7 cm superior to the carina. Esophageal tube tip is not well evaluated. Left-sided central venous catheter tip overlies the SVC origin but tip is directed laterally to the patient's right. No left pneumothorax. Cardiomegaly with central vascular congestion. Increased bilateral pulmonary opacities compatible with worsening edema. Suspect that there are bilateral effusions. There is dense bibasilar consolidation. IMPRESSION: 1. Left-sided central venous catheter tip overlies the SVC origin, tip is directed laterally to the patient's right. There is no pneumothorax. 2. Cardiomegaly with central vascular congestion and worsened interstitial and alveolar opacities most suspicious for pulmonary edema 3. Suspect that there are bilateral pleural effusions. Dense bilateral lung base consolidation. Electronically Signed   By: Jasmine Pang M.D.   On: 05/10/2016 23:57   Dg Chest Port 1 View  Result Date: 05/10/2016 CLINICAL DATA:  Acute respiratory failure EXAM: PORTABLE CHEST 1 VIEW COMPARISON:  05/10/2016 FINDINGS: Endotracheal tube tip is near the carina. Esophageal tube tip extends below diaphragm but tip is not well visualized. Low lung volumes. Improved aeration of the right thorax. Persistent small bilateral effusions and left lower lobe atelectasis or infiltrate. Stable cardiomegaly with central vascular congestion and mild perihilar edema. IMPRESSION: 1. Endotracheal tube tip is near the carina 2. Improved aeration bilaterally. Small residual pleural effusions. Cardiomegaly with central congestion and mild perihilar edema. Cannot exclude atelectasis or pneumonia at the left lung base. Electronically Signed   By: Jasmine Pang M.D.   On: 05/10/2016 19:02   Dg Chest Port 1 View  Result Date: 05/10/2016 CLINICAL DATA:  Respiratory failure and shortness of Breath EXAM: PORTABLE CHEST 1 VIEW COMPARISON:  05/09/2016 FINDINGS: Cardiac shadow remains  enlarged. Endotracheal tube and nasogastric catheter are again seen in satisfactory position. Bilateral pleural effusions are seen as well as diffuse vascular congestion. No focal confluent infiltrate is seen. IMPRESSION: Bilateral pleural effusions and changes of mild CHF. Electronically Signed   By: Alcide Clever M.D.   On: 05/10/2016 07:24   Dg Abd Portable 1v  Result Date: 05/09/2016 CLINICAL DATA:  71 y/o  M; adjustment of enteric tube. EXAM: PORTABLE ABDOMEN - 1 VIEW COMPARISON:  05/09/2016 abdomen radiograph. FINDINGS: Normal bowel gas pattern. Enteric tube tip has advanced to the mid stomach. IMPRESSION: Enteric tube tip has advanced to mid stomach. Electronically Signed   By: Mitzi Hansen M.D.   On: 05/09/2016 16:06   Dg Abd Portable 1v  Result Date: 05/09/2016 CLINICAL DATA:  Post OG tube placement EXAM: PORTABLE ABDOMEN - 1 VIEW COMPARISON:  None. FINDINGS: Orogastric tube with the tip at the gastroesophageal junction. There is no bowel dilatation to suggest obstruction. There is no evidence of pneumoperitoneum, portal venous gas or pneumatosis. There are no pathologic calcifications along the expected course of the ureters. The osseous structures are unremarkable. IMPRESSION: Orogastric tube with the tip at the gastroesophageal junction. Recommend advancing the orogastric tube 15  cm. Electronically Signed   By: Elige KoHetal  Patel   On: 05/09/2016 15:51   Dg Abd Portable 1v  Result Date: 05/09/2016 CLINICAL DATA:  71 year old male with attempted advancement of enteric tube since the 1219 hour film today. EXAM: PORTABLE ABDOMEN - 1 VIEW COMPARISON:  Portable views of the abdomen at 1219 hours today. FINDINGS: Portable AP supine view at 1241 hours. Unchanged configuration of the enteric tube located at the junction of the chest and abdomen. The side hole appears to be at the level of the distal esophagus and unchanged from the earlier film. The tip of the tube is at the level of the gastric  cardia. If the tube was advanced then the extra length probably coiled in the patient's pharynx. Visible bowel gas pattern remains normal. Stable visible lung bases. IMPRESSION: Unchanged location of the enteric tube with side hole at the level of the distal thoracic esophagus. I am advised that the tube was advanced since the prior film - and thus the extra length probably coiled in the patient's pharynx. This was discussed by telephone with the inpatient floor at the time of the study. I advised against tube feeds at this time, and was told they will attempt a completely new tube later. Electronically Signed   By: Odessa FlemingH  Hall M.D.   On: 05/09/2016 13:53   Dg Abd Portable 1v  Result Date: 05/09/2016 CLINICAL DATA:  Orogastric tube placement. EXAM: PORTABLE ABDOMEN - 1 VIEW COMPARISON:  None. FINDINGS: Examination is limited by patient body habitus. An enteric tube projects over the distal esophagus with tip likely near the GE junction. Gas is present in grossly nondilated loops of small and large bowel in the visualized portion of the abdomen. Cardiomegaly and bibasilar lung airspace opacities are more fully evaluated on today's earlier chest radiograph. IMPRESSION: Enteric tube overlying the distal esophagus as above. Recommend advancing approximately 15 cm to place the tip and side hole well within the stomach. Electronically Signed   By: Sebastian AcheAllen  Grady M.D.   On: 05/09/2016 13:38     Studies: Echo 3/06 >> EF 65 to 70%, mod LVH, mod TR, PAS 39 mmHg  Antibiotics: Zosyn 3/06 >> 3/08 Rocephin 3/08 >>  Cultures: Urine 3/05 >> negative Blood 3/06 >> Sputum 3/06 >> Haemophilus influenzae  Lines/tubes: ETT 3/05 >>  Lt IJ CVL 3/07 >>   [facility]  Summary: 71 yo male with VDRF, sepsis from aspiration pneumonia.  Assessment/plan:  Acute on chronic hypoxic/hypercapnic respiratory failure from aspiration pneumonia. OSA, OHS. - pressure support wean >> might be ready for  extubation trial soon - f/u CXR - will need Bipap qhs  Septic shock 2nd to Haemophilus influenzae pneumonia. - pressors if SBP < 90, MAP < 65 - day 3 of Abx >> change to rocephin 3/08  Hx of a fib, HLD, HTN. - heparin gtt per pharmacy - continue lipitor - hold outpt norvasc, lasix, cozaar, lopressor  Anemia of critical illness. - f/u CBC  Hx of hypothyroidism, gout. - hold outpt allopurinol - continue synthroid  DVT prophylaxis - heparin gtt SUP - protonix Nutrition - tube feeds Goals of care - Full code  CC time 31 minutes  Coralyn HellingVineet Niala Stcharles, MD [facility] 05/11/2016, 8:26 AM Pager:  [phone] After 3pm call: [phone]

## 2016-05-11 NOTE — Progress Notes (Signed)
eLink Physician-Brief Progress Note Patient Name: Omar FellingWilliam R [name] DOB: 1945-09-21 MRN: [id number]   Date of Service  05/11/2016  HPI/Events of Note    eICU Interventions  Hypokalemia -repleted      Intervention Category Minor Interventions: Electrolytes abnormality - evaluation and management  ALVA,RAKESH V. 05/11/2016, 4:48 AM

## 2016-05-11 NOTE — Progress Notes (Signed)
CRITICAL VALUE ALERT  Critical value received:  PH= 7.24, pCo2= 68, pO2= 81.9, HCO3= 28.0  Date of notification:  05/11/16  Time of notification:  1750  Critical value read back:Yes.    Nurse who received alert:  Tory EmeraldMichelle Emari Hreha, RN  MD notified (1st page):  Dr Jamison NeighborNestor  Time of first page:  1750  MD notified (2nd page):  Time of second page:  Responding MD:  Dr Jamison NeighborNestor  Time MD responded:  [phone]

## 2016-05-11 NOTE — Progress Notes (Signed)
eLink Physician-Brief Progress Note Patient Name: Omar FellingWilliam R [name] DOB: 07-06-1945 MRN: [id number]   Date of Service  05/11/2016  HPI/Events of Note  Patient with worsening hypercarbia based on ABG. Patient currently on noninvasive positive pressure ventilation with pressure control 10 & PEEP 6. Rate 15. Medical ventilation 7-8 L/m. Saturation 100%. Spontaneous rate 19-20 breaths per minute.   eICU Interventions  1. Continuing noninvasive positive pressure ventilation 2. Continuing close monitoring 3. Repeat ABG in 30 minutes      Intervention Category Major Interventions: Acid-Base disturbance - evaluation and management;Respiratory failure - evaluation and management  Lawanda CousinsJennings Sarahgrace Broman 05/11/2016, 4:42 PM

## 2016-05-11 NOTE — Procedures (Signed)
Extubation Procedure Note  Patient Details:   Name: Omar [name] DOB: 09/13/1945 MRN: [id number]   Airway Documentation:     Evaluation  O2 sats: stable throughout Complications: No apparent complications Patient did tolerate procedure well. Bilateral Breath Sounds: Rhonchi, Diminished   Yes  Patient tolerated wean. MD ordered to extubate. Positive for cuff leak. Patient extubated to a 4 Lpm nasal cannula. No signs of dyspnea. Patient with slight stridor but no distress. MD made aware. Patient instructed on the Incentive Spirometer achieving 600mL three times. Patient resting comfortably.   Ancil BoozerSmallwood, Shakerria Parran 05/11/2016, 1:25 PM

## 2016-05-11 NOTE — Progress Notes (Signed)
eLink Physician-Brief Progress Note Patient Name: Ignacia FellingWilliam R Pickering DOB: 20-Feb-1946 MRN: [id number]   Date of Service  05/11/2016  HPI/Events of Note  ABG pending. Camara check shows patient still in mild to moderate respiratory distress. Rounding intensivist from this morning recommends removal of NG tube and noninvasive positive pressure ventilation.   eICU Interventions  1. Awaiting stat ABG 2. Plan for repeat ABG 30 minutes after starting BiPAP 3. Removing NG tube 4. Starting noninvasive positive pressure ventilation      Intervention Category Major Interventions: Change in mental status - evaluation and management  Lawanda CousinsJennings Raynee Mccasland 05/11/2016, 4:16 PM

## 2016-05-11 NOTE — Progress Notes (Signed)
eLink Physician-Brief Progress Note Patient Name: Omar FellingWilliam R [name] DOB: May 03, 1945 MRN: [id number]   Date of Service  05/11/2016  HPI/Events of Note  Persistent hypercarbia and poor mental status.   eICU Interventions  1. Increasing RR and minute ventilation 2. If no mental status improvement in 20 minutes plan for intubation     Intervention Category Major Interventions: Respiratory failure - evaluation and management;Acid-Base disturbance - evaluation and management  Lawanda CousinsJennings Arica Bevilacqua 05/11/2016, 5:45 PM

## 2016-05-11 NOTE — Progress Notes (Signed)
eLink Physician-Brief Progress Note Patient Name: Omar FellingWilliam R [name] DOB: Feb 13, 1946 MRN: [id number]   Date of Service  05/11/2016  HPI/Events of Note  Daughter and grandchildren at bedside. Camera check shows patient awake with eyes open and following commands nodding as well as giving me a thumbs up appropriately.   eICU Interventions  1. Shifting patient to more upright position to improve diaphragmatic excursion 2. Continuing noninvasive positive pressure ventilation      Intervention Category Major Interventions: Change in mental status - evaluation and management;Respiratory failure - evaluation and management  Lawanda CousinsJennings Shenicka Sunderlin 05/11/2016, 6:51 PM

## 2016-05-11 NOTE — Progress Notes (Signed)
eLink Physician-Brief Progress Note Patient Name: Ignacia FellingWilliam R Burson DOB: 1945/11/10 MRN: [id number]   Date of Service  05/11/2016  HPI/Events of Note  Patient extubated this morning. Nurse reports progressively becoming more lethargic. Does have NG tube in place. Our opening eyes only with aggressive stimulation.   eICU Interventions  1. Stat ABG 2. Notifying intensivist for bedside evaluation and possible need for reintubation.      Intervention Category Major Interventions: Respiratory failure - evaluation and management;Change in mental status - evaluation and management  Lawanda CousinsJennings Kristl Morioka 05/11/2016, 4:09 PM

## 2016-05-11 NOTE — Progress Notes (Signed)
ANTICOAGULATION CONSULT NOTE - Follow Up Consult  Pharmacy Consult for Heparin (warfarin on hold) Indication: atrial fibrillation  Allergies  Allergen Reactions  . Anacin Af [Acetaminophen]   . Niaspan [Niacin Er]     Patient Measurements: Height: 5\' 6"  (167.6 cm) Weight: (!) 345 lb (156.5 kg) IBW/kg (Calculated) : 63.8 Heparin Dosing Weight: 102.5 kg  Vital Signs: Temp: 99 F (37.2 C) (03/08 0815) Temp Source: Core (Comment) (03/08 0800) BP: 109/69 (03/08 0815) Pulse Rate: 82 (03/08 0815)  Labs:  Recent Labs  08-22-2016 1736 05/09/16 0408 05/10/16 0548 05/11/16 0400  HGB 11.1* 11.6* 10.6* 9.3*  HCT 35.9* 37.6* 34.3* 30.7*  PLT 137* 130* 148* 121*  APTT  --  39*  --   --   LABPROT 26.3* 26.7* 26.2* 22.8*  INR 2.37 2.41 2.36 1.98  CREATININE 1.19 1.04 1.01 1.32*  TROPONINI <0.03  --   --   --     Estimated Creatinine Clearance: 72.2 mL/min (by C-G formula based on SCr of 1.32 mg/dL (H)).  Assessment: 71 year old male on chronic warfarin prior to admission for atrial fibrillation. Warfarin is currently on hold and consult ordered for pharmacy to initiate IV heparin when INR is < 2.   Current INR is 1.98. H/H trend down, Plts 148>>121. No bleeding noted.  OK per Dr. Craige CottaSood to start IV heparin.  Goal of Therapy:  Heparin level 0.3-0.7 units/ml Monitor platelets by anticoagulation protocol: Yes   Plan:  -Start heparin (no bolus) at 1450 units/hr. -Heparin level in 8 hours. -Daily heparin level and CBC while on therapy.  -Monitor for signs and symptoms of bleeding.   Link SnufferJessica Sharlotte Baka, PharmD, BCPS Clinical Pharmacist Clinical phone 05/11/2016 until 3:30 PM [phone] After hours, please call [phone] 05/11/2016,8:27 AM

## 2016-05-11 NOTE — Progress Notes (Signed)
eLink Physician-Brief Progress Note Patient Name: Omar FellingWilliam R Spake DOB: November 28, 1945 MRN: [id number]   Date of Service  05/11/2016  HPI/Events of Note  Camera check shows patient awake and apparently comfortable on BiPAP. Settings unchanged.   eICU Interventions  Continuing close monitoring and noninvasive positive pressure ventilation.      Intervention Category Major Interventions: Respiratory failure - evaluation and management  Lawanda CousinsJennings Tanisa Lagace 05/11/2016, 11:00 PM

## 2016-05-12 ENCOUNTER — Inpatient Hospital Stay (HOSPITAL_COMMUNITY): Payer: Medicare Other

## 2016-05-12 LAB — POCT I-STAT 3, ART BLOOD GAS (G3+)
BICARBONATE: 28 mmol/L (ref 20.0–28.0)
O2 Saturation: 89 %
PH ART: 7.253 — AB (ref 7.350–7.450)
PO2 ART: 71 mmHg — AB (ref 83.0–108.0)
TCO2: 30 mmol/L (ref 0–100)
pCO2 arterial: 64.1 mmHg — ABNORMAL HIGH (ref 32.0–48.0)

## 2016-05-12 LAB — CBC
HEMATOCRIT: 31.7 % — AB (ref 39.0–52.0)
Hemoglobin: 9.4 g/dL — ABNORMAL LOW (ref 13.0–17.0)
MCH: 31.6 pg (ref 26.0–34.0)
MCHC: 29.7 g/dL — ABNORMAL LOW (ref 30.0–36.0)
MCV: 106.7 fL — AB (ref 78.0–100.0)
Platelets: 117 10*3/uL — ABNORMAL LOW (ref 150–400)
RBC: 2.97 MIL/uL — AB (ref 4.22–5.81)
RDW: 16.5 % — AB (ref 11.5–15.5)
WBC: 3.7 10*3/uL — AB (ref 4.0–10.5)

## 2016-05-12 LAB — BASIC METABOLIC PANEL
ANION GAP: 3 — AB (ref 5–15)
BUN: 30 mg/dL — ABNORMAL HIGH (ref 6–20)
CHLORIDE: 112 mmol/L — AB (ref 101–111)
CO2: 28 mmol/L (ref 22–32)
Calcium: 8.4 mg/dL — ABNORMAL LOW (ref 8.9–10.3)
Creatinine, Ser: 1.15 mg/dL (ref 0.61–1.24)
Glucose, Bld: 85 mg/dL (ref 65–99)
POTASSIUM: 4 mmol/L (ref 3.5–5.1)
SODIUM: 143 mmol/L (ref 135–145)

## 2016-05-12 LAB — HEPARIN LEVEL (UNFRACTIONATED)
HEPARIN UNFRACTIONATED: 0.74 [IU]/mL — AB (ref 0.30–0.70)
Heparin Unfractionated: 0.56 IU/mL (ref 0.30–0.70)
Heparin Unfractionated: 0.68 IU/mL (ref 0.30–0.70)

## 2016-05-12 LAB — PROTIME-INR
INR: 1.94
Prothrombin Time: 22.4 seconds — ABNORMAL HIGH (ref 11.4–15.2)

## 2016-05-12 MED ORDER — SODIUM CHLORIDE 0.9% FLUSH: 10.0000 mL | INTRAVENOUS | Status: DC | PRN

## 2016-05-12 MED ORDER — LACTATED RINGERS IV SOLN
INTRAVENOUS | Status: DC
  Administered 2016-05-12 – 2016-05-15 (×2): via INTRAVENOUS

## 2016-05-12 MED ORDER — FUROSEMIDE 10 MG/ML IJ SOLN
40.0000 mg | Freq: Two times a day (BID) | INTRAMUSCULAR | Status: DC
  Administered 2016-05-12 (×2): 40 mg via INTRAVENOUS
  Filled 2016-05-12 (×3): qty 4

## 2016-05-12 MED ORDER — SODIUM CHLORIDE 0.9% FLUSH
10.0000 mL | Freq: Two times a day (BID) | INTRAVENOUS | Status: DC
  Administered 2016-05-12: 10 mL

## 2016-05-12 MED ORDER — METOPROLOL TARTRATE 5 MG/5ML IV SOLN
10.0000 mg | Freq: Four times a day (QID) | INTRAVENOUS | Status: DC
  Administered 2016-05-12 – 2016-05-16 (×13): 10 mg via INTRAVENOUS
  Administered 2016-05-16: 5 mg via INTRAVENOUS
  Administered 2016-05-16: 2.5 mg via INTRAVENOUS
  Administered 2016-05-16 – 2016-05-18 (×7): 10 mg via INTRAVENOUS
  Filled 2016-05-12 (×26): qty 10

## 2016-05-12 MED ORDER — CHLORHEXIDINE GLUCONATE CLOTH 2 % EX PADS
6.0000 | MEDICATED_PAD | Freq: Every day | CUTANEOUS | Status: DC
  Administered 2016-05-12 – 2016-06-11 (×26): 6 via TOPICAL

## 2016-05-12 NOTE — Progress Notes (Signed)
RT placed patient on BIPAP HS. Patient tolerating well. 

## 2016-05-12 NOTE — Telephone Encounter (Signed)
Patient had a follow up appointment with  Doctor.

## 2016-05-12 NOTE — Progress Notes (Signed)
Removed patient from BiPAP per MD request. Placed patient on 2 Lpm nasal cannula. Will continue to monitor.

## 2016-05-12 NOTE — Progress Notes (Signed)
PCCM Progress Note  Admission date: 05/29/2016 Referring provider: Dr. Clarene Duke, [facility]  CC: altered mental status  HPI: 71 yo male from [facility] with altered mental status with aspiration, sepsis, hypoxic/hypercapnic respiratory failure.  Subjective:   Vital signs: BP 122/84   Pulse 94   Temp 100 F (37.8 C)   Resp 17   Ht 5\' 6"  (1.676 m)   Wt (!) 348 lb (157.9 kg)   SpO2 100%   BMI 56.17 kg/m   Intake/output:  Intake/Output Summary (Last 24 hours) at 05/12/16 1002 Last data filed at 05/12/16 0900  Gross per 24 hour  Intake          1317.93 ml  Output             2925 ml  Net         -1607.07 ml     General appearance:  71 Year old  Male obese, NAD,  conversant  Eyes: anicteric sclerae, moist conjunctivae; PERRL, EOMI bilaterally. Mouth:  membranes dry; normal hard and soft palate, BIPAP mask in place  Neck: Trachea midline; neck supple, no JVD Lungs/chest: CTA,, decreased t/o  CV: RRR, no MRGs  Abdomen: Soft, non-tender; no masses or HSM Extremities: generalized anasarca Skin: Normal temperature, turgor and texture; no rash, ulcers or subcutaneous nodules Neuro/Psych: Appropriate affect, alert and oriented to person, place and time, stable right sided hemiparesis    CMP Latest Ref Rng & Units 05/12/2016 05/11/2016 05/10/2016  Glucose 65 - 99 mg/dL 85 409(W) 98  BUN 6 - 20 mg/dL 11(B) 14(N) 82(N)  Creatinine 0.61 - 1.24 mg/dL 5.62 1.30(Q) 6.57  Sodium 135 - 145 mmol/L 143 142 141  Potassium 3.5 - 5.1 mmol/L 4.0 3.3(L) 3.7  Chloride 101 - 111 mmol/L 112(H) 109 110  CO2 22 - 32 mmol/L 28 27 26   Calcium 8.9 - 10.3 mg/dL 8.4(O) 8.3(L) 8.8(L)  Total Protein 6.5 - 8.1 g/dL - - -  Total Bilirubin 0.3 - 1.2 mg/dL - - -  Alkaline Phos 38 - 126 U/L - - -  AST 15 - 41 U/L - - -  ALT 17 - 63 U/L - - -     CBC Latest Ref Rng & Units 05/12/2016 05/11/2016 05/10/2016  WBC 4.0 - 10.5 K/uL 3.7(L) 5.5 6.9  Hemoglobin 13.0 - 17.0 g/dL 9.6(E) 9.5(M) 10.6(L)  Hematocrit 39.0 - 52.0 %  31.7(L) 30.7(L) 34.3(L)  Platelets 150 - 400 K/uL 117(L) 121(L) 148(L)     ABG    Component Value Date/Time   PHART 7.253 (L) 05/12/2016 0342   PCO2ART 64.1 (H) 05/12/2016 0342   PO2ART 71.0 (L) 05/12/2016 0342   HCO3 28.0 05/12/2016 0342   TCO2 30 05/12/2016 0342   O2SAT 89.0 05/12/2016 0342     Recent Labs  05/11/16 0745 05/11/16 1121 05/11/16 1534  GLUCAP 100* 81 80     Imaging: Ct Head Wo Contrast  Result Date: 05/10/2016 CLINICAL DATA:  Change in mental status. Patient unresponsive today. EXAM: CT HEAD WITHOUT CONTRAST TECHNIQUE: Contiguous axial images were obtained from the base of the skull through the vertex without intravenous contrast. COMPARISON:  None. FINDINGS: Brain: Mild atrophy and white matter changes are stable, within normal limits age. No acute infarct, hemorrhage, or mass lesion is present. The ventricles are of normal size. No significant extraaxial fluid collection is present. The brainstem and cerebellum are within normal limits. Vascular: No hyperdense vessel or unexpected calcification. Skull: Calvarium is within normal limits. No focal lytic or blastic  lesions are present. Sinuses/Orbits: The paranasal sinuses and mastoid air cells are clear. 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 A left inferior ophthalmic vein varix is also stable. Other: The patient is intubated.  An NG tube is in place. IMPRESSION: 1. Stable mild atrophy and white matter disease. 2. No acute abnormality. 3. 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 Electronically Signed   By: Marin Robertshristopher  Mattern M.D.   On: 05/10/2016 19:06   Dg Chest Port 1 View  Result Date: 05/12/2016 CLINICAL DATA:  71 year old male with hypoxia and respiratory failure. EXAM: PORTABLE CHEST 1 VIEW COMPARISON:  05/11/2016 and earlier. FINDINGS: Portable AP semi upright view at 0435 hours. Extubated. Enteric tube removed. Stable left IJ central line. Stable cardiomegaly and mediastinal  contours. Calcified aortic atherosclerosis. Mixed interstitial and streaky bilateral pulmonary opacity persists, maximal at the left lung base. No pneumothorax or pleural effusion. Stable right lower neck surgical clips. IMPRESSION: 1. Extubated and enteric tube removed.  Stable lung volumes. 2. Continued bilateral combined interstitial and patchy pulmonary opacity which could reflect the combination of edema/atelectasis or multifocal infection. Electronically Signed   By: Odessa FlemingH  Hall M.D.   On: 05/12/2016 07:33   Dg Chest Port 1 View  Result Date: 05/11/2016 CLINICAL DATA:  Respiratory failure EXAM: PORTABLE CHEST 1 VIEW COMPARISON:  05/10/2016 FINDINGS: Endotracheal tube unchanged, 2 cm above the carina. Central line in the SVC unchanged. NG tube in place with the tip not visualized Cardiac enlargement. Congestive heart failure with edema shows mild interval improvement. Improved aeration of the lung bases with less atelectasis. Minimal pleural effusions have improved. IMPRESSION: Improving congestive heart failure and pulmonary edema. Electronically Signed   By: Marlan Palauharles  Clark M.D.   On: 05/11/2016 07:43   Dg Chest Port 1 View  Result Date: 05/10/2016 CLINICAL DATA:  Central line placement EXAM: PORTABLE CHEST 1 VIEW COMPARISON:  05/10/2016 FINDINGS: Endotracheal tube tip is approximately 1.7 cm superior to the carina. Esophageal tube tip is not well evaluated. Left-sided central venous catheter tip overlies the SVC origin but tip is directed laterally to the patient's right. No left pneumothorax. Cardiomegaly with central vascular congestion. Increased bilateral pulmonary opacities compatible with worsening edema. Suspect that there are bilateral effusions. There is dense bibasilar consolidation. IMPRESSION: 1. Left-sided central venous catheter tip overlies the SVC origin, tip is directed laterally to the patient's right. There is no pneumothorax. 2. Cardiomegaly with central vascular congestion and worsened  interstitial and alveolar opacities most suspicious for pulmonary edema 3. Suspect that there are bilateral pleural effusions. Dense bilateral lung base consolidation. Electronically Signed   By: Jasmine PangKim  Fujinaga M.D.   On: 05/10/2016 23:57   Dg Chest Port 1 View  Result Date: 05/10/2016 CLINICAL DATA:  Acute respiratory failure EXAM: PORTABLE CHEST 1 VIEW COMPARISON:  05/10/2016 FINDINGS: Endotracheal tube tip is near the carina. Esophageal tube tip extends below diaphragm but tip is not well visualized. Low lung volumes. Improved aeration of the right thorax. Persistent small bilateral effusions and left lower lobe atelectasis or infiltrate. Stable cardiomegaly with central vascular congestion and mild perihilar edema. IMPRESSION: 1. Endotracheal tube tip is near the carina 2. Improved aeration bilaterally. Small residual pleural effusions. Cardiomegaly with central congestion and mild perihilar edema. Cannot exclude atelectasis or pneumonia at the left lung base. Electronically Signed   By: Jasmine PangKim  Fujinaga M.D.   On: 05/10/2016 19:02   Personally reviewed CXR on 3/9: bilateral diffuse patchy airspace disease. No sig change w/ exception of ETT out.  Studies: Echo 3/06 >> EF 65 to 70%, mod LVH, mod TR, PAS 39 mmHg  Antibiotics: Zosyn 3/06 >> 3/08 Rocephin 3/08 >>  Cultures: Urine 3/05 >> negative Blood 3/06 >> Sputum 3/06 >> Haemophilus influenzae (beta lactamase positive)  Lines/tubes: ETT 3/05 >> 3/8 Lt IJ CVL 3/07 >>   Events: 3/05 Transfer to [facility] 3/8 extubated off pressors. No longer meeting sirs criteria    Resolved issues Septic shock ->off pressors 3/8    Impression/plan  Acute on Chronic hypoxic and hypercarbic respiratory failure in setting of aspiration PNA (H Flu Positive) superimposed on underlying OSA/OHS  -extubated 3/9 Plan Transition to BIPAP HS and PRN Cont pulm hygiene w/ IS and Flutter Mobilize Lasix IV; aiming for negative volume status  Rocephin started 3/8,  now day 4/7 abx  H/o afib. HLD and HTN  Plan Heparin gtt Lasix today Hold Norvasc, cozzaar and lopressor  Anemia of critical illness Plan  Trend cbc  Heparin gtt  H/o hypothyroidism an gout  Plan  Cont synthroid (1/2 dose IV) Hold allopurinol   Remote CVA w/ right sided hemiparesis.  W/c bound at baseline.  Plan OOB PT consult  DVT prophylaxis: heparin gtt. Eventually back to coumadin  SUP: ppi  Diet: NPO, if does well can start clears  Activity: OOB w/ lift  Disposition : ICU as of 3/9  Discussion 71 year old w/ prior stroke, probable OSA/OHS. Acute on chronic resp failure from H flu PNA. Still boarderline. Will keep in ICU for aggressive pulm hygiene. OK to cycle BIPAP. Keep NPO for now. See if lasix will help resp status. Hold all sedating meds. I updated his family and discussed the plan of care w/ the nursing staff.   My critical care time 31 minutes.    Simonne Martinet ACNP-BC [facility] Pulmonary/Critical Care Pager # [phone] OR # [phone] if no answer

## 2016-05-12 NOTE — Progress Notes (Addendum)
ANTICOAGULATION CONSULT NOTE - Follow Up Consult  Pharmacy Consult for Heparin (warfarin on hold) Indication: atrial fibrillation  Allergies  Allergen Reactions  . Anacin Af [Acetaminophen]   . Niaspan [Niacin Er]    Patient Measurements: Height: 5\' 6"  (167.6 cm) Weight: (!) 348 lb (157.9 kg) IBW/kg (Calculated) : 63.8 Heparin Dosing Weight: 102.5 kg  Vital Signs: Temp: 99.7 F (37.6 C) (03/09 0600) Temp Source: Core (Comment) (03/09 0400) BP: 108/66 (03/09 0600) Pulse Rate: 81 (03/09 0600)  Labs:  Recent Labs  05/10/16 0548 05/11/16 0400 05/11/16 1700 05/12/16 0320  HGB 10.6* 9.3*  --  9.4*  HCT 34.3* 30.7*  --  31.7*  PLT 148* 121*  --  117*  LABPROT 26.2* 22.8*  --  22.4*  INR 2.36 1.98  --  1.94  HEPARINUNFRC  --   --  0.31 0.56  CREATININE 1.01 1.32*  --  1.15    Estimated Creatinine Clearance: 83.3 mL/min (by C-G formula based on SCr of 1.15 mg/dL).  Assessment: 71 year old male on chronic warfarin prior to admission for atrial fibrillation. Warfarin is currently on hold and consult ordered for pharmacy to initiate IV heparin when INR is < 2.   Current INR is 1.98. H/H trend down, Plts 148>>121. No bleeding or infusion related issues reported.   Heparin level therapeutic: 0.56 on heparin 1550 units/hr. No issues with infusion or bleeding.  Goal of Therapy:  Heparin level 0.3-0.7 units/ml Monitor platelets by anticoagulation protocol: Yes   Plan:  Continue heparin 1550 units/hr 8h confirmatory HL Daily heparin level and CBC while on therapy.  Monitor for signs and symptoms of bleeding.    Arlean Hoppingorey M. Newman PiesBall, PharmD, BCPS Clinical Pharmacist [phone] 05/12/2016 8:18 AM  ADDN: Confirmatory HL is slightly supratherapeutic and rising. Will reduce rate slightly to 1500 units/hr.  Arlean Hoppingorey M. Newman PiesBall, PharmD, BCPS Clinical Pharmacist #

## 2016-05-12 NOTE — Progress Notes (Signed)
Wasted 200mL of Fentanyl in sink. Witnessed by Dan MakerJames Dunlop.

## 2016-05-12 NOTE — Progress Notes (Signed)
ANTICOAGULATION CONSULT NOTE - Follow Up Consult  Pharmacy Consult for Heparin (warfarin on hold) Indication: atrial fibrillation  Allergies  Allergen Reactions  . Anacin Af [Acetaminophen]   . Niaspan [Niacin Er]    Patient Measurements: Height: 5\' 6"  (167.6 cm) Weight: (!) 348 lb (157.9 kg) IBW/kg (Calculated) : 63.8 Heparin Dosing Weight: 102.5 kg  Vital Signs: Temp: 100.6 F (38.1 C) (03/09 1200) BP: 144/70 (03/09 1900) Pulse Rate: 84 (03/09 1900)  Labs:  Recent Labs  05/10/16 0548 05/11/16 0400  05/12/16 0320 05/12/16 1102 05/12/16 1955  HGB 10.6* 9.3*  --  9.4*  --   --   HCT 34.3* 30.7*  --  31.7*  --   --   PLT 148* 121*  --  117*  --   --   LABPROT 26.2* 22.8*  --  22.4*  --   --   INR 2.36 1.98  --  1.94  --   --   HEPARINUNFRC  --   --   < > 0.56 0.74* 0.68  CREATININE 1.01 1.32*  --  1.15  --   --   < > = values in this interval not displayed.  Estimated Creatinine Clearance: 83.3 mL/min (by C-G formula based on SCr of 1.15 mg/dL).  Assessment: 71 year old male on chronic warfarin prior to admission for atrial fibrillation. Warfarin is currently on hold and consult ordered for pharmacy to initiate IV heparin when INR is < 2.   Current INR is 1.94. H/H and Plts low but stable after down trend. No bleeding or infusion related issues reported.   Heparin level therapeutic: 0.68 - will reduce infusion rate slightly   Goal of Therapy:  Heparin level 0.3-0.7 units/ml Monitor platelets by anticoagulation protocol: Yes   Plan:  Reduce heparin gtt to 1450 units/hr Daily heparin level and CBC while on therapy.  Monitor for signs and symptoms of bleeding.    Ruben Imony Safia Panzer, PharmD Clinical Pharmacist Pager: [phone] 05/12/2016 8:47 PM

## 2016-05-13 ENCOUNTER — Inpatient Hospital Stay (HOSPITAL_COMMUNITY): Payer: Medicare Other

## 2016-05-13 DIAGNOSIS — D696 Thrombocytopenia, unspecified: Secondary | ICD-10-CM

## 2016-05-13 DIAGNOSIS — I509 Heart failure, unspecified: Secondary | ICD-10-CM | POA: Insufficient documentation

## 2016-05-13 DIAGNOSIS — D649 Anemia, unspecified: Secondary | ICD-10-CM

## 2016-05-13 LAB — CBC
HEMATOCRIT: 25.1 % — AB (ref 39.0–52.0)
HEMOGLOBIN: 7.4 g/dL — AB (ref 13.0–17.0)
MCH: 31.5 pg (ref 26.0–34.0)
MCHC: 29.5 g/dL — AB (ref 30.0–36.0)
MCV: 106.8 fL — ABNORMAL HIGH (ref 78.0–100.0)
Platelets: 120 10*3/uL — ABNORMAL LOW (ref 150–400)
RBC: 2.35 MIL/uL — AB (ref 4.22–5.81)
RDW: 16.2 % — ABNORMAL HIGH (ref 11.5–15.5)
WBC: 4.9 10*3/uL (ref 4.0–10.5)

## 2016-05-13 LAB — POCT I-STAT 3, ART BLOOD GAS (G3+)
ACID-BASE EXCESS: 1 mmol/L (ref 0.0–2.0)
BICARBONATE: 27.6 mmol/L (ref 20.0–28.0)
O2 Saturation: 96 %
PCO2 ART: 53.8 mmHg — AB (ref 32.0–48.0)
PH ART: 7.318 — AB (ref 7.350–7.450)
PO2 ART: 89 mmHg (ref 83.0–108.0)
TCO2: 29 mmol/L (ref 0–100)

## 2016-05-13 LAB — BASIC METABOLIC PANEL
Anion gap: 8 (ref 5–15)
BUN: 24 mg/dL — ABNORMAL HIGH (ref 6–20)
CHLORIDE: 107 mmol/L (ref 101–111)
CO2: 31 mmol/L (ref 22–32)
CREATININE: 1.09 mg/dL (ref 0.61–1.24)
Calcium: 8.5 mg/dL — ABNORMAL LOW (ref 8.9–10.3)
GFR calc non Af Amer: >60 mL/min (ref >60)
Glucose, Bld: 98 mg/dL (ref 65–99)
POTASSIUM: 3.9 mmol/L (ref 3.5–5.1)
Sodium: 146 mmol/L — ABNORMAL HIGH (ref 135–145)

## 2016-05-13 LAB — CBC WITH DIFFERENTIAL/PLATELET
Basophils Absolute: 0 10*3/uL (ref 0.0–0.1)
Basophils Relative: 0 %
EOS ABS: 0 10*3/uL (ref 0.0–0.7)
EOS PCT: 0 %
HEMATOCRIT: 24.8 % — AB (ref 39.0–52.0)
Hemoglobin: 7.5 g/dL — ABNORMAL LOW (ref 13.0–17.0)
LYMPHS ABS: 1.1 10*3/uL (ref 0.7–4.0)
Lymphocytes Relative: 11 %
MCH: 30.1 pg (ref 26.0–34.0)
MCHC: 30.2 g/dL (ref 30.0–36.0)
MCV: 99.6 fL (ref 78.0–100.0)
MONO ABS: 1.7 10*3/uL — AB (ref 0.1–1.0)
Monocytes Relative: 18 %
NEUTROS PCT: 71 %
Neutro Abs: 6.9 10*3/uL (ref 1.7–7.7)
PLATELETS: 114 10*3/uL — AB (ref 150–400)
RBC: 2.49 MIL/uL — AB (ref 4.22–5.81)
RDW: 19.6 % — AB (ref 11.5–15.5)
WBC: 9.7 10*3/uL (ref 4.0–10.5)

## 2016-05-13 LAB — BLOOD GAS, ARTERIAL
Acid-base deficit: 2.9 mmol/L — ABNORMAL HIGH (ref 0.0–2.0)
Bicarbonate: 23.2 mmol/L (ref 20.0–28.0)
DRAWN BY: 23604
FIO2: 60
MECHVT: 540 mL
O2 SAT: 92.8 %
PEEP: 5 cmH2O
PH ART: 7.243 — AB (ref 7.350–7.450)
Patient temperature: 100.4
RATE: 16 resp/min
pCO2 arterial: 56.5 mmHg — ABNORMAL HIGH (ref 32.0–48.0)
pO2, Arterial: 78 mmHg — ABNORMAL LOW (ref 83.0–108.0)

## 2016-05-13 LAB — GLUCOSE, CAPILLARY: GLUCOSE-CAPILLARY: 89 mg/dL (ref 65–99)

## 2016-05-13 LAB — APTT: aPTT: 51 seconds — ABNORMAL HIGH (ref 24–36)

## 2016-05-13 LAB — HEMOGLOBIN AND HEMATOCRIT, BLOOD
HEMATOCRIT: 21.9 % — AB (ref 39.0–52.0)
HEMATOCRIT: 23.5 % — AB (ref 39.0–52.0)
HEMOGLOBIN: 6.7 g/dL — AB (ref 13.0–17.0)
HEMOGLOBIN: 7.1 g/dL — AB (ref 13.0–17.0)

## 2016-05-13 LAB — PREPARE RBC (CROSSMATCH)

## 2016-05-13 LAB — PROTIME-INR
INR: 2.16
INR: 3.12
Prothrombin Time: 24.4 seconds — ABNORMAL HIGH (ref 11.4–15.2)
Prothrombin Time: 32.8 seconds — ABNORMAL HIGH (ref 11.4–15.2)

## 2016-05-13 LAB — HEPARIN LEVEL (UNFRACTIONATED): HEPARIN UNFRACTIONATED: 0.58 [IU]/mL (ref 0.30–0.70)

## 2016-05-13 LAB — LACTIC ACID, PLASMA
LACTIC ACID, VENOUS: 4.5 mmol/L — AB (ref 0.5–1.9)
Lactic Acid, Venous: 3.6 mmol/L (ref 0.5–1.9)

## 2016-05-13 LAB — PROCALCITONIN: Procalcitonin: 0.53 ng/mL

## 2016-05-13 MED ORDER — FENTANYL 2500MCG IN NS 250ML (10MCG/ML) PREMIX INFUSION
25.0000 ug/h | INTRAVENOUS | Status: DC
  Administered 2016-05-13: 50 ug/h via INTRAVENOUS
  Administered 2016-05-15: 100 ug/h via INTRAVENOUS
  Administered 2016-05-16 – 2016-05-19 (×7): 200 ug/h via INTRAVENOUS
  Administered 2016-05-19: 100 ug/h via INTRAVENOUS
  Administered 2016-05-20 (×2): 200 ug/h via INTRAVENOUS
  Administered 2016-05-21: 250 ug/h via INTRAVENOUS
  Filled 2016-05-13 (×14): qty 250

## 2016-05-13 MED ORDER — PHENYLEPHRINE HCL 10 MG/ML IJ SOLN: 0.0000 ug/min | INTRAMUSCULAR | Status: DC

## 2016-05-13 MED ORDER — NOREPINEPHRINE BITARTRATE 1 MG/ML IV SOLN
0.0000 ug/min | INTRAVENOUS | Status: DC
  Administered 2016-05-13: 5 ug/min via INTRAVENOUS
  Filled 2016-05-13: qty 4

## 2016-05-13 MED ORDER — SODIUM CHLORIDE 0.9 % IV SOLN
Freq: Once | INTRAVENOUS | Status: AC
  Administered 2016-05-13: 23:00:00 via INTRAVENOUS

## 2016-05-13 MED ORDER — SODIUM CHLORIDE 0.9 % IV SOLN
Freq: Once | INTRAVENOUS | Status: AC
  Administered 2016-05-13: 13:00:00 via INTRAVENOUS

## 2016-05-13 MED ORDER — SODIUM CHLORIDE 0.9 % IV SOLN
Freq: Once | INTRAVENOUS | Status: AC
  Administered 2016-05-13: 17:00:00 via INTRAVENOUS

## 2016-05-13 MED ORDER — LACTATED RINGERS IV BOLUS (SEPSIS)
1000.0000 mL | Freq: Once | INTRAVENOUS | Status: AC
  Administered 2016-05-13: 1000 mL via INTRAVENOUS

## 2016-05-13 MED ORDER — MIDAZOLAM HCL 2 MG/2ML IJ SOLN: 2.0000 mg | Freq: Once | INTRAMUSCULAR | Status: AC

## 2016-05-13 MED ORDER — MIDAZOLAM HCL 2 MG/2ML IJ SOLN
1.0000 mg | INTRAMUSCULAR | Status: AC | PRN
  Administered 2016-05-15 – 2016-05-18 (×3): 1 mg via INTRAVENOUS
  Filled 2016-05-13 (×2): qty 2

## 2016-05-13 MED ORDER — PROTAMINE SULFATE 10 MG/ML IV SOLN: 50.0000 mg | Freq: Once | INTRAVENOUS | Status: DC

## 2016-05-13 MED ORDER — PROTAMINE SULFATE 10 MG/ML IV SOLN
25.0000 mg | Freq: Once | INTRAVENOUS | Status: AC
  Administered 2016-05-13: 25 mg via INTRAVENOUS
  Filled 2016-05-13: qty 2.5

## 2016-05-13 MED ORDER — SODIUM CHLORIDE 0.9 % IV SOLN
0.0000 ug/min | INTRAVENOUS | Status: DC
  Administered 2016-05-13 – 2016-05-14 (×9): 400 ug/min via INTRAVENOUS
  Administered 2016-05-15: 230 ug/min via INTRAVENOUS
  Administered 2016-05-15: 250 ug/min via INTRAVENOUS
  Administered 2016-05-15 (×4): 400 ug/min via INTRAVENOUS
  Filled 2016-05-13 (×17): qty 8

## 2016-05-13 MED ORDER — NOREPINEPHRINE BITARTRATE 1 MG/ML IV SOLN
0.0000 ug/min | INTRAVENOUS | Status: DC
  Administered 2016-05-13 – 2016-05-15 (×6): 40 ug/min via INTRAVENOUS
  Administered 2016-05-16: 12 ug/min via INTRAVENOUS
  Administered 2016-05-17: 14 ug/min via INTRAVENOUS
  Administered 2016-05-18: 20 ug/min via INTRAVENOUS
  Administered 2016-05-19: 26 ug/min via INTRAVENOUS
  Administered 2016-05-19: 24 ug/min via INTRAVENOUS
  Administered 2016-05-21: 16 ug/min via INTRAVENOUS
  Administered 2016-05-22 – 2016-05-28 (×5): 8 ug/min via INTRAVENOUS
  Filled 2016-05-13 (×20): qty 16

## 2016-05-13 MED ORDER — FENTANYL CITRATE (PF) 100 MCG/2ML IJ SOLN: 50.0000 ug | Freq: Once | INTRAMUSCULAR | Status: DC

## 2016-05-13 MED ORDER — FENTANYL CITRATE (PF) 100 MCG/2ML IJ SOLN
100.0000 ug | Freq: Once | INTRAMUSCULAR | Status: AC
  Administered 2016-05-13: 100 ug via INTRAVENOUS

## 2016-05-13 MED ORDER — FENTANYL CITRATE (PF) 100 MCG/2ML IJ SOLN
INTRAMUSCULAR | Status: AC
  Filled 2016-05-13: qty 2

## 2016-05-13 MED ORDER — SODIUM CHLORIDE 0.9 % IV SOLN
0.0000 ug/min | INTRAVENOUS | Status: DC
  Administered 2016-05-13: 200 ug/min via INTRAVENOUS
  Filled 2016-05-13: qty 1

## 2016-05-13 MED ORDER — ETOMIDATE 2 MG/ML IV SOLN
20.0000 mg | Freq: Once | INTRAVENOUS | Status: AC
  Administered 2016-05-13: 20 mg via INTRAVENOUS

## 2016-05-13 MED ORDER — MIDAZOLAM HCL 2 MG/2ML IJ SOLN
1.0000 mg | INTRAMUSCULAR | Status: DC | PRN
  Administered 2016-05-18 – 2016-05-30 (×40): 1 mg via INTRAVENOUS
  Filled 2016-05-13 (×44): qty 2

## 2016-05-13 MED ORDER — SODIUM CHLORIDE 0.9 % IV SOLN
0.0000 ug/min | INTRAVENOUS | Status: DC
  Administered 2016-05-13: 400 ug/min via INTRAVENOUS
  Filled 2016-05-13 (×3): qty 4

## 2016-05-13 MED ORDER — FENTANYL BOLUS VIA INFUSION
50.0000 ug | INTRAVENOUS | Status: DC | PRN
  Administered 2016-05-15 – 2016-05-21 (×6): 50 ug via INTRAVENOUS
  Filled 2016-05-13: qty 50

## 2016-05-13 MED ORDER — MIDAZOLAM HCL 2 MG/2ML IJ SOLN
INTRAMUSCULAR | Status: AC
  Administered 2016-05-13: 11:00:00
  Filled 2016-05-13: qty 2

## 2016-05-13 MED ORDER — DOCUSATE SODIUM 50 MG/5ML PO LIQD
100.0000 mg | Freq: Two times a day (BID) | ORAL | Status: DC | PRN
  Filled 2016-05-13 (×2): qty 10

## 2016-05-13 NOTE — Progress Notes (Signed)
eLink Physician-Brief Progress Note Patient Name: Omar FellingWilliam R [name] DOB: 09/08/1945 MRN: [id number]   Date of Service  05/13/2016  HPI/Events of Note  Hypotension - On maximal dose Phenylephrine IV infusin. BP = 79/59. Last CVP = 3.   eICU Interventions  Will order: 1. LR 1 liter IV over 1 hour now.      Intervention Category Major Interventions: Hypotension - evaluation and management;Hypovolemia - evaluation and treatment with fluids  Sommer,Steven Eugene 05/13/2016, 6:46 PM

## 2016-05-13 NOTE — Progress Notes (Signed)
PT Cancellation Note  Patient Details Name: Omar [name] MRN: [id number] DOB: 30-Jan-1946   Cancelled Treatment:    Reason Eval/Treat Not Completed: Medical issues which prohibited therapy.  Patient emergently intubated this am.  Will monitor patient until appropriate for PT evaluation.   Vena AustriaSusan H Zamira Hickam 05/13/2016, 12:55 PM Durenda HurtSusan H. Renaldo Fiddleravis, PT, [facility] Acute Rehab Services Pager [phone]

## 2016-05-13 NOTE — Progress Notes (Signed)
eLink Physician-Brief Progress Note Patient Name: Omar FellingWilliam R [name] DOB: 1945-06-02 MRN: [id number]   Date of Service  05/13/2016  HPI/Events of Note  Abdomen and Pelvis CT Scan - 1. Large right retroperitoneal hematoma as above. 2. Small bilateral pleural effusions with extensive consolidation/atelectasis posteriorly in both lung bases. Hbg = 7.1. Currently on a Phenylephrine IV infusion for hemodynamic support.   eICU Interventions  Will order: 1. PT/INR and PTT now.  2. Transfuse 1 unit PRBC now.  3. CBC and platelets s/p transfusion PRBC.     Intervention Category Major Interventions: Hemorrhage - evaluation and management  Tascha Casares Eugene 05/13/2016, 4:53 PM

## 2016-05-13 NOTE — Progress Notes (Signed)
ANTICOAGULATION CONSULT NOTE - Follow Up Consult  Pharmacy Consult for Heparin (warfarin on hold) Indication: atrial fibrillation  Allergies  Allergen Reactions  . Anacin Af [Acetaminophen]   . Niaspan [Niacin Er]    Patient Measurements: Height: 5\' 6"  (167.6 cm) Weight: (!) 319 lb (144.7 kg) IBW/kg (Calculated) : 63.8 Heparin Dosing Weight: 102.5 kg  Vital Signs: Temp: 100.2 F (37.9 C) (03/10 1000) BP: 89/56 (03/10 1000) Pulse Rate: 110 (03/10 1000)  Labs:  Recent Labs  05/11/16 0400  05/12/16 0320 05/12/16 1102 05/12/16 1955 05/13/16 0427 05/13/16 1043  HGB 9.3*  --  9.4*  --   --  7.4* 6.7*  HCT 30.7*  --  31.7*  --   --  25.1* 21.9*  PLT 121*  --  117*  --   --  120*  --   LABPROT 22.8*  --  22.4*  --   --  24.4*  --   INR 1.98  --  1.94  --   --  2.16  --   HEPARINUNFRC  --   < > 0.56 0.74* 0.68 0.58  --   CREATININE 1.32*  --  1.15  --   --  1.09  --   < > = values in this interval not displayed.  Estimated Creatinine Clearance: 83.4 mL/min (by C-G formula based on SCr of 1.09 mg/dL).  Assessment: 71 year old male on chronic warfarin prior to admission for atrial fibrillation. Warfarin is currently on hold and consult ordered for pharmacy to initiate IV heparin when INR is < 2.   Current INR is 1.94. H/H and Plts low but stable after down trend. No bleeding or infusion related issues reported.   Heparin level therapeutic: 0.58 on heparin 1450 units/hr. There is concern for bleeding and so heparin is being stopped and neutralized by protamine 25mg .  Goal of Therapy:  Heparin level 0.3-0.7 units/ml Monitor platelets by anticoagulation protocol: Yes   Plan:  Holding heparin and giving protamine 25mg  x1 Monitor s/sx of bleeding F/u anticoag plan moving forward   Arlean Hoppingorey M. Newman PiesBall, PharmD, BCPS Clinical Pharmacist [phone] 05/13/2016 11:12 AM

## 2016-05-13 NOTE — Progress Notes (Signed)
CRITICAL VALUE ALERT  Critical value received:  Hgb 6.7  Date of notification:  05/13/16  Time of notification:  1100  Critical value read back:Yes.    Nurse who received alert:  Lorin PicketLindsey Can Lucci  MD notified (1st page):  Tyson AliasFeinstein  Time of first page:  1115  MD notified (2nd page):  Time of second page:  Responding MD:  Tyson AliasFeinstein  Time MD responded:  [phone]

## 2016-05-13 NOTE — Progress Notes (Signed)
PCCM Progress Note  Admission date: 06/02/2016 Referring provider[facility]  CC: altered mental status  HPI: 71 y/o male from [facility] admitted 3/5 with altered mental status with aspiration, sepsis, hypoxic/hypercapnic respiratory failure.    Subjective:  RN reports hypotension, hgb drop, AF with RVR, respiratory distress on bipap.  Pt complains of abdominal / back pain   Vital signs: BP (!) 89/56   Pulse (!) 110   Temp 100.2 F (37.9 C)   Resp (!) 30   Ht 5\' 6"  (1.676 m)   Wt (!) 319 lb (144.7 kg)   SpO2 96%   BMI 51.49 kg/m   Intake/output:  Intake/Output Summary (Last 24 hours) at 05/13/16 1113 Last data filed at 05/13/16 1000  Gross per 24 hour  Intake           624.38 ml  Output             7345 ml  Net         -6720.62 ml   General: elderly male on BiPAP, appears critically ill HEENT: MM pink/moist PSY: calm Neuro: Awake/alert prior to intubation, follows commands  CV: s1s2 rrr, no m/r/g PULM: even/non-labored, lungs bilaterally coarse with scattered rhonchi ZO:XWRUGI:soft, non-tender, bsx4 active  Extremities: warm/dry, 1+ edema  Skin: no rashes or lesions    CMP Latest Ref Rng & Units 05/13/2016 05/12/2016 05/11/2016  Glucose 65 - 99 mg/dL 98 85 045(W111(H)  BUN 6 - 20 mg/dL 09(W24(H) 11(B30(H) 14(N41(H)  Creatinine 0.61 - 1.24 mg/dL 8.291.09 5.621.15 1.30(Q1.32(H)  Sodium 135 - 145 mmol/L 146(H) 143 142  Potassium 3.5 - 5.1 mmol/L 3.9 4.0 3.3(L)  Chloride 101 - 111 mmol/L 107 112(H) 109  CO2 22 - 32 mmol/L 31 28 27   Calcium 8.9 - 10.3 mg/dL 6.5(H8.5(L) 8.4(O8.4(L) 8.3(L)  Total Protein 6.5 - 8.1 g/dL - - -  Total Bilirubin 0.3 - 1.2 mg/dL - - -  Alkaline Phos 38 - 126 U/L - - -  AST 15 - 41 U/L - - -  ALT 17 - 63 U/L - - -     CBC Latest Ref Rng & Units 05/13/2016 05/13/2016 05/12/2016  WBC 4.0 - 10.5 K/uL - 4.9 3.7(L)  Hemoglobin 13.0 - 17.0 g/dL 6.7(LL) 7.4(L) 9.4(L)  Hematocrit 39.0 - 52.0 % 21.9(L) 25.1(L) 31.7(L)  Platelets 150 - 400 K/uL - 120(L) 117(L)     ABG    Component Value  Date/Time   PHART 7.253 (L) 05/12/2016 0342   PCO2ART 64.1 (H) 05/12/2016 0342   PO2ART 71.0 (L) 05/12/2016 0342   HCO3 28.0 05/12/2016 0342   TCO2 30 05/12/2016 0342   O2SAT 89.0 05/12/2016 0342     Recent Labs  05/11/16 0745 05/11/16 1121 05/11/16 1534  GLUCAP 100* 81 80     Imaging: Dg Chest Port 1 View  Result Date: 05/12/2016 CLINICAL DATA:  71 year old male with hypoxia and respiratory failure. EXAM: PORTABLE CHEST 1 VIEW COMPARISON:  05/11/2016 and earlier. FINDINGS: Portable AP semi upright view at 0435 hours. Extubated. Enteric tube removed. Stable left IJ central line. Stable cardiomegaly and mediastinal contours. Calcified aortic atherosclerosis. Mixed interstitial and streaky bilateral pulmonary opacity persists, maximal at the left lung base. No pneumothorax or pleural effusion. Stable right lower neck surgical clips. IMPRESSION: 1. Extubated and enteric tube removed.  Stable lung volumes. 2. Continued bilateral combined interstitial and patchy pulmonary opacity which could reflect the combination of edema/atelectasis or multifocal infection. Electronically Signed   By: Althea GrimmerH  Hall M.D.  On: 05/12/2016 07:33    Studies: Echo 3/06 >> EF 65 to 70%, mod LVH, mod TR, PAS 39 mmHg CT head 3/7 >> no acute abnormality   Antibiotics: Zosyn 3/06 >> 3/08 Rocephin (H.Flu PNA) 3/08 >>  Cultures: Urine 3/05 >> negative Blood 3/06 >> Sputum 3/06 >> Haemophilus influenzae (beta lactamase positive)  Lines/tubes: ETT 3/05 >> 3/8 Lt IJ CVL 3/07 >>   Events: 3/05  Transfer to [facility] 3/08  extubated off pressors. No longer meeting sirs criteria  3/10  Reintubated, concern for bleed with drop in Hgb on heparin gtt, resp distress, AF w RVR   Impression/plan:   Acute on Chronic Hypoxic and Hypercarbic Respiratory Failure in setting of aspiration PNA (H Flu Positive) superimposed on underlying OSA/OHS. Extubated 3/8.  Reintubated 3/10 with suspected bleeding (anemia, abd pain, AF, resp  distress) - now intubation  - PRVC 8 cc/kg - wean PEEP / FiO2 for sats > 92% - continue rocephin, D5/7 - cxr post intubation  - abg 1 hour post intubation  Hypotension - suspect in setting of acute bleeding  H/o afib. HLD and HTN  - discontinue heparin gtt with hgb drop  - neosynephrine gtt for MAP >65  - LR at 50 ml/hr - hold lasix  - hold norvasc, cozaar, lopressor  - ICU monitoring of hemodynamics   Rule Out Retroperitoneal Bleed  - STAT CT of abd/pelvis  - PRBC as below   Anemia of critical illness - acute drop on 3/10, concern for RP bleed Thrombocytopenia  - trend CBC - stop heparin gtt now  - protamine 25 mg IV x1 - 1 unit PRBC now  - monitor for bleeding   Hypothyroidism Gout   - continue synthroid at 1/2 home dose  - hold allopurinol   Remote CVA w/ right sided hemiparesis.  W/c bound at baseline.  - PT efforts as able   Morbid Obesity  - hold TF until CT Abd/pelvis assessed  DVT prophylaxis:  SCD's, hold heparin 3/10 SUP: PPI  Diet: NPO  Activity: as tolerated Disposition : ICU   NP CC Time: 30 minutes   Canary Brim, NP-C [place] Pulmonary & Critical Care Pgr: [phone] or if no answer [phone] 05/13/2016, 11:29 AM

## 2016-05-13 NOTE — Progress Notes (Signed)
Rt note- Patient placed back to NIV due to increased work of breathing, with coarse BBS. Continue to monitor.

## 2016-05-13 NOTE — Progress Notes (Signed)
eLink Physician-Brief Progress Note Patient Name: Omar FellingWilliam R [name] DOB: Jul 11, 1945 MRN: [id number]   Date of Service  05/13/2016  HPI/Events of Note  Multiple issues: 1. INR = 3.12, BP = 77/61 with MAP = 68 and request of L wrist restraint.  eICU Interventions  Will order: 1. Transfuse 4 units FFP. 2. Repaeat PT/INR and PTT 1 hour post FFP infusion.  3. L wrist soft restraint.  4. Norepinephrine IV infusion. Titrate to MAP > 65. 5. ABG now.      Intervention Category Major Interventions: Hypotension - evaluation and management Intermediate Interventions: Coagulopathy - evaluation and management  Sommer,Steven Eugene 05/13/2016, 8:46 PM

## 2016-05-13 NOTE — Progress Notes (Signed)
CRITICAL VALUE ALERT  Critical value received:  Lactic acid 4.5  Date of notification:  05/13/16  Time of notification:  1245  Critical value read back:Yes.    Nurse who received alert:  Lorin PicketLindsey Taylan Marez RN  MD notified (1st page):  Earnest RosierBrandy Ollis  Time of first page:  1246  MD notified (2nd page):  Time of second page:  Responding MD:  Earnest RosierBrandy Ollis  Time MD responded:  1245

## 2016-05-13 NOTE — Procedures (Signed)
Intubation Procedure Note ARLEY SALAMONE [id number] Apr 20, 1945  Procedure: Intubation Indications: Respiratory insufficiency  Procedure Details Consent: Unable to obtain consent because of emergent medical necessity. Time Out: Verified patient identification, verified procedure, site/side was marked, verified correct patient position, special equipment/implants available, medications/allergies/relevent history reviewed, required imaging and test results available.  Performed  Maximum sterile technique was used including gloves, gown and hand hygiene.  MAC and 3    Evaluation Hemodynamic Status: Persistent hypotension treated with pressors; O2 sats: stable throughout Patient's Current Condition: stable Complications: No apparent complications Patient did tolerate procedure well. Chest X-ray ordered to verify placement.  CXR: pending.   Raylene Miyamoto 05/13/2016

## 2016-05-14 ENCOUNTER — Inpatient Hospital Stay (HOSPITAL_COMMUNITY): Payer: Medicare Other

## 2016-05-14 DIAGNOSIS — R4182 Altered mental status, unspecified: Secondary | ICD-10-CM

## 2016-05-14 LAB — CULTURE, BLOOD (ROUTINE X 2)
CULTURE: NO GROWTH
Culture: NO GROWTH

## 2016-05-14 LAB — CBC
HCT: 23.3 % — ABNORMAL LOW (ref 39.0–52.0)
HEMATOCRIT: 21.9 % — AB (ref 39.0–52.0)
HEMOGLOBIN: 6.8 g/dL — AB (ref 13.0–17.0)
Hemoglobin: 7.1 g/dL — ABNORMAL LOW (ref 13.0–17.0)
MCH: 30.6 pg (ref 26.0–34.0)
MCH: 30.4 pg (ref 26.0–34.0)
MCHC: 30.5 g/dL (ref 30.0–36.0)
MCHC: 31.1 g/dL (ref 30.0–36.0)
MCV: 100.4 fL — AB (ref 78.0–100.0)
MCV: 97.8 fL (ref 78.0–100.0)
PLATELETS: 120 10*3/uL — AB (ref 150–400)
PLATELETS: 110 10*3/uL — AB (ref 150–400)
RBC: 2.32 MIL/uL — ABNORMAL LOW (ref 4.22–5.81)
RBC: 2.24 MIL/uL — AB (ref 4.22–5.81)
RDW: 20.2 % — AB (ref 11.5–15.5)
RDW: 20.2 % — ABNORMAL HIGH (ref 11.5–15.5)
WBC: 13 10*3/uL — ABNORMAL HIGH (ref 4.0–10.5)
WBC: 16.6 10*3/uL — AB (ref 4.0–10.5)

## 2016-05-14 LAB — POCT I-STAT 3, ART BLOOD GAS (G3+)
Acid-base deficit: 6 mmol/L — ABNORMAL HIGH (ref 0.0–2.0)
Acid-base deficit: 7 mmol/L — ABNORMAL HIGH (ref 0.0–2.0)
BICARBONATE: 19.7 mmol/L — AB (ref 20.0–28.0)
BICARBONATE: 19.3 mmol/L — AB (ref 20.0–28.0)
O2 SAT: 94 %
O2 Saturation: 92 %
PCO2 ART: 44.1 mmHg (ref 32.0–48.0)
PO2 ART: 72 mmHg — AB (ref 83.0–108.0)
PO2 ART: 84 mmHg (ref 83.0–108.0)
Patient temperature: 99.6
TCO2: 21 mmol/L (ref 0–100)
TCO2: 21 mmol/L (ref 0–100)
pCO2 arterial: 41.1 mmHg (ref 32.0–48.0)
pH, Arterial: 7.288 — ABNORMAL LOW (ref 7.350–7.450)
pH, Arterial: 7.252 — ABNORMAL LOW (ref 7.350–7.450)

## 2016-05-14 LAB — PREPARE RBC (CROSSMATCH)

## 2016-05-14 LAB — PROTIME-INR
INR: 2.81
Prothrombin Time: 30.2 seconds — ABNORMAL HIGH (ref 11.4–15.2)

## 2016-05-14 LAB — BASIC METABOLIC PANEL
Anion gap: 13 (ref 5–15)
BUN: 35 mg/dL — ABNORMAL HIGH (ref 6–20)
CO2: 22 mmol/L (ref 22–32)
Calcium: 8.3 mg/dL — ABNORMAL LOW (ref 8.9–10.3)
Chloride: 109 mmol/L (ref 101–111)
Creatinine, Ser: 2.63 mg/dL — ABNORMAL HIGH (ref 0.61–1.24)
GFR calc Af Amer: 26 mL/min — ABNORMAL LOW (ref >60)
GFR, EST NON AFRICAN AMERICAN: 23 mL/min — AB (ref >60)
GLUCOSE: 67 mg/dL (ref 65–99)
POTASSIUM: 4.4 mmol/L (ref 3.5–5.1)
Sodium: 144 mmol/L (ref 135–145)

## 2016-05-14 LAB — GLUCOSE, CAPILLARY
GLUCOSE-CAPILLARY: 48 mg/dL — AB (ref 65–99)
GLUCOSE-CAPILLARY: 150 mg/dL — AB (ref 65–99)
Glucose-Capillary: 108 mg/dL — ABNORMAL HIGH (ref 65–99)
Glucose-Capillary: 65 mg/dL (ref 65–99)

## 2016-05-14 LAB — MAGNESIUM
Magnesium: 1.8 mg/dL (ref 1.7–2.4)
Magnesium: 1.6 mg/dL — ABNORMAL LOW (ref 1.7–2.4)
Magnesium: 1.7 mg/dL (ref 1.7–2.4)

## 2016-05-14 LAB — APTT: aPTT: 43 seconds — ABNORMAL HIGH (ref 24–36)

## 2016-05-14 LAB — PHOSPHORUS
PHOSPHORUS: 5.9 mg/dL — AB (ref 2.5–4.6)
PHOSPHORUS: 6.1 mg/dL — AB (ref 2.5–4.6)
Phosphorus: 5.2 mg/dL — ABNORMAL HIGH (ref 2.5–4.6)

## 2016-05-14 LAB — LACTIC ACID, PLASMA: LACTIC ACID, VENOUS: 8.3 mmol/L — AB (ref 0.5–1.9)

## 2016-05-14 MED ORDER — SODIUM CHLORIDE 0.9 % IV SOLN
Freq: Once | INTRAVENOUS | Status: AC
  Administered 2016-05-14: 11:00:00 via INTRAVENOUS

## 2016-05-14 MED ORDER — PRO-STAT SUGAR FREE PO LIQD
30.0000 mL | Freq: Two times a day (BID) | ORAL | Status: DC
  Administered 2016-05-14 – 2016-05-15 (×3): 30 mL
  Filled 2016-05-14 (×3): qty 30

## 2016-05-14 MED ORDER — LACTATED RINGERS IV BOLUS (SEPSIS)
1000.0000 mL | Freq: Once | INTRAVENOUS | Status: AC
  Administered 2016-05-14: 1000 mL via INTRAVENOUS

## 2016-05-14 MED ORDER — ALBUTEROL SULFATE (2.5 MG/3ML) 0.083% IN NEBU
2.5000 mg | INHALATION_SOLUTION | RESPIRATORY_TRACT | Status: DC | PRN
  Administered 2016-05-14 – 2016-06-03 (×5): 2.5 mg via RESPIRATORY_TRACT
  Filled 2016-05-14 (×6): qty 3

## 2016-05-14 MED ORDER — SODIUM CHLORIDE 0.9 % IV SOLN
Freq: Once | INTRAVENOUS | Status: AC
Start: 2016-05-14 — End: 2016-05-14
  Administered 2016-05-14: 23:00:00 via INTRAVENOUS

## 2016-05-14 MED ORDER — DEXTROSE 50 % IV SOLN
INTRAVENOUS | Status: AC
  Administered 2016-05-14: 50 mL
  Filled 2016-05-14: qty 50

## 2016-05-14 MED ORDER — MAGNESIUM SULFATE 2 GM/50ML IV SOLN
2.0000 g | Freq: Once | INTRAVENOUS | Status: AC
  Administered 2016-05-14: 2 g via INTRAVENOUS
  Filled 2016-05-14: qty 50

## 2016-05-14 MED ORDER — SODIUM CHLORIDE 0.9 % IV SOLN
0.0300 [IU]/min | INTRAVENOUS | Status: DC
  Administered 2016-05-14 – 2016-05-26 (×12): 0.03 [IU]/min via INTRAVENOUS
  Filled 2016-05-14 (×14): qty 2

## 2016-05-14 MED ORDER — SODIUM CHLORIDE 0.9 % IV SOLN
INTRAVENOUS | Status: DC | PRN
  Administered 2016-05-16 (×2): via INTRA_ARTERIAL

## 2016-05-14 MED ORDER — HYDROCORTISONE NA SUCCINATE PF 100 MG IJ SOLR
50.0000 mg | Freq: Four times a day (QID) | INTRAMUSCULAR | Status: DC
  Administered 2016-05-14 – 2016-05-16 (×9): 50 mg via INTRAVENOUS
  Filled 2016-05-14 (×9): qty 1
  Filled 2016-05-14: qty 2

## 2016-05-14 MED ORDER — VITAL HIGH PROTEIN PO LIQD
1000.0000 mL | ORAL | Status: DC
  Administered 2016-05-14: 1000 mL
  Administered 2016-05-15: 04:00:00

## 2016-05-14 MED ORDER — VITAL HIGH PROTEIN PO LIQD: 1000.0000 mL | ORAL | Status: DC

## 2016-05-14 NOTE — Progress Notes (Signed)
CRITICAL VALUE ALERT  Critical value received: hgb 6.8  Date of notification:  05/14/2016   Time of notification:  1815  Critical value read back:yes  Nurse who received alert:  Georganna SkeansMarilyn Watson, elink RN (stated that she will notify Dr. Cherly BeachSomers)  MD notified (1st page): na   Time of first page:  na  MD notified (2nd page):  Time of second page:  Responding MD:  na  Time MD responded:  na

## 2016-05-14 NOTE — Progress Notes (Signed)
PT Cancellation Note  Patient Details Name: Omar FellingWilliam R Kothari MRN: [id number] DOB: 1945-12-12   Cancelled Treatment:    Reason Eval/Treat Not Completed: Patient not medically ready. On pressors with labile BP will continue to follow.   Fabio AsaDevon J Dereka Lueras 05/14/2016, 7:44 AM

## 2016-05-14 NOTE — Procedures (Signed)
Arterial Catheter Insertion Procedure Note Ignacia FellingWilliam R Enck [id number] [birth year]  Procedure: Insertion of Arterial Catheter  Indications: Blood pressure monitoring  Procedure Details Consent: Unable to obtain consent because of altered level of consciousness. Time Out: Verified patient identification, verified procedure, site/side was marked, verified correct patient position, special equipment/implants available, medications/allergies/relevent history reviewed, required imaging and test results available.  Performed  Maximum sterile technique was used including antiseptics, cap, gloves, gown, hand hygiene, mask and sheet. Skin prep: Chlorhexidine; local anesthetic administered 20 gauge catheter was inserted into left radial artery using the Seldinger technique.  Evaluation Blood flow good; BP tracing good. Complications: No apparent complications.  Positive doppler for collateral circulation. Placement successful on second attempt.  Normal saline flush used.  Line leveled and zeroed, dressed per RT protocol.  Patient tolerated well.     Donnelly AngelicaMcCollum, Alexei Doswell Ann 05/14/2016

## 2016-05-14 NOTE — Progress Notes (Signed)
eLink Physician-Brief Progress Note Patient Name: Omar FellingWilliam R [name] DOB: 22-Jan-1946 MRN: [id number]   Date of Service  05/14/2016  HPI/Events of Note  Remains hypotensive. Arterial line placed and confirms BP  eICU Interventions  Bolus 1 liter LR now Lactic acid being sent now Just finished 4 units FFP  Lactic acid being sent    Intervention Category Major Interventions: Hypotension - evaluation and management  Henry RusselSMITH, [name], P 05/14/2016, 6:11 AM

## 2016-05-14 NOTE — Progress Notes (Signed)
PCCM Progress Note  Admission date: 05/12/2016 Referring provider: Dr. Clarene Duke, [facility]  CC: altered mental status  HPI: 71 y/o male from [facility] admitted 3/5 with altered mental status with aspiration, sepsis, hypoxic/hypercapnic respiratory failure.    Subjective:  RN reports hypotension on levo + neo.  FFP infusing.    Vital signs: BP (!) 87/61   Pulse (!) 118   Temp 99.5 F (37.5 C)   Resp 18   Ht 5\' 6"  (1.676 m)   Wt (!) 313 lb (142 kg)   SpO2 95%   BMI 50.52 kg/m   Intake/output:  Intake/Output Summary (Last 24 hours) at 05/14/16 1015 Last data filed at 05/14/16 0800  Gross per 24 hour  Intake          8064.81 ml  Output                0 ml  Net          8064.81 ml   General:  Obese male on vent, critically ill appearing  HEENT: MM pink/moist, ETT Neuro: sedate, eyes open to voice, then back to sleep CV: s1s2 rrr, no m/r/g PULM: even/non-labored, lungs bilaterally with wheezing WU:JWJX, non-tender, bsx4 active  Extremities: warm/dry, trace LE edema  Skin: no rashes or lesions     CMP Latest Ref Rng & Units 05/14/2016 05/13/2016 05/12/2016  Glucose 65 - 99 mg/dL 67 98 85  BUN 6 - 20 mg/dL 91(Y) 78(G) 95(A)  Creatinine 0.61 - 1.24 mg/dL 2.13(Y) 8.65 7.84  Sodium 135 - 145 mmol/L 144 146(H) 143  Potassium 3.5 - 5.1 mmol/L 4.4 3.9 4.0  Chloride 101 - 111 mmol/L 109 107 112(H)  CO2 22 - 32 mmol/L 22 31 28   Calcium 8.9 - 10.3 mg/dL 8.3(L) 8.5(L) 8.4(L)  Total Protein 6.5 - 8.1 g/dL - - -  Total Bilirubin 0.3 - 1.2 mg/dL - - -  Alkaline Phos 38 - 126 U/L - - -  AST 15 - 41 U/L - - -  ALT 17 - 63 U/L - - -     CBC Latest Ref Rng & Units 05/14/2016 05/13/2016 05/13/2016  WBC 4.0 - 10.5 K/uL 13.0(H) 9.7 -  Hemoglobin 13.0 - 17.0 g/dL 7.1(L) 7.5(L) 7.1(L)  Hematocrit 39.0 - 52.0 % 23.3(L) 24.8(L) 23.5(L)  Platelets 150 - 400 K/uL 120(L) 114(L) -     ABG    Component Value Date/Time   PHART 7.288 (L) 05/14/2016 0727   PCO2ART 41.1 05/14/2016 0727   PO2ART 72.0  (L) 05/14/2016 0727   HCO3 19.7 (L) 05/14/2016 0727   TCO2 21 05/14/2016 0727   ACIDBASEDEF 6.0 (H) 05/14/2016 0727   O2SAT 92.0 05/14/2016 0727     Recent Labs  05/11/16 1121 05/11/16 1534 05/13/16 1520  GLUCAP 81 80 89     Imaging: Ct Abdomen Pelvis Wo Contrast  Result Date: 05/13/2016 CLINICAL DATA:  Ct a/p wo abd / back pain on heparin gtt, with hgb drop to 6.7 EXAM: CT ABDOMEN AND PELVIS WITHOUT CONTRAST TECHNIQUE: Multidetector CT imaging of the abdomen and pelvis was performed following the standard protocol without IV contrast. Image degradation secondary to body habitus. COMPARISON:  None available FINDINGS: Lower chest: Bilateral small pleural effusions. Patchy airspace consolidation/atelectasis posteriorly in the lung bases, right greater than left. Endotracheal tube terminates above the carina. No pneumothorax. Hepatobiliary: Unremarkable unenhanced scan of the liver. Hyperdense material in the gallbladder lumen possibly vicarious excretion of contrast material. Pancreas: Grossly unremarkable Spleen: Normal in size without  focal abnormality. Adrenals/Urinary Tract: Normal adrenals. No hydronephrosis or urolithiasis. Foley catheter decompresses urinary bladder. Stomach/Bowel: Nasogastric tube into decompressed stomach. Small bowel is nondilated. Appendix not discretely identified. Colon is nondilated. Vascular/Lymphatic: Scattered aortoiliac arterial plaque without aneurysm. No definite adenopathy localized. Reproductive: Prostate is unremarkable. Other: There is extensive right retroperitoneal mixed attenuation fluid collection extending cephalad lateral to the liver to the level of the hemidiaphragm, and caudad into the pelvic extraperitoneal space medial to the acetabulum. The collection measures up to 17.4 cm transverse diameter, the dominant component extending over a craniocaudal length of approximately 26.3 cm. The collection results in mass effect upon the right kidney  displaced medially. No definite ascites.  No free air. Musculoskeletal: No definite displaced fracture or worrisome bone lesion. IMPRESSION: 1. Large right retroperitoneal hematoma as above. 2. Small bilateral pleural effusions with extensive consolidation/atelectasis posteriorly in both lung bases. Electronically Signed   By: Corlis Leak  Hassell M.D.   On: 05/13/2016 15:24   Dg Chest Port 1 View  Result Date: 05/14/2016 CLINICAL DATA:  Acute respiratory failure with hypoxia EXAM: PORTABLE CHEST 1 VIEW COMPARISON:  Chest radiograph from one day prior. FINDINGS: Endotracheal tube tip is 2.4 cm above the carina. Enteric tube enters stomach with the tip not seen on this image. Stable cardiomediastinal silhouette with mild cardiomegaly. No pneumothorax. Stable small right pleural effusion. Mild to moderate pulmonary edema appears slightly improved. Bibasilar lung opacities appear stable. Surgical clips overlie the medial lower right neck. IMPRESSION: 1. Well-positioned support structures.  No pneumothorax. 2. Stable small right pleural effusion. 3. Mild to moderate congestive heart failure, slightly improved. 4. Bibasilar lung opacities, stable, favor atelectasis. Electronically Signed   By: Delbert PhenixJason A Poff M.D.   On: 05/14/2016 07:58   Portable Chest Xray  Result Date: 05/13/2016 CLINICAL DATA:  ETT placement EXAM: PORTABLE CHEST 1 VIEW COMPARISON:  05/12/2016 FINDINGS: Endotracheal tube terminates 3 cm above the carina. Bilateral lower lungs are obscured by overlying soft tissue. At least a right pleural effusion is suspected. No pneumothorax. Cardiomegaly with mild interstitial edema. IMPRESSION: Endotracheal tube terminates 3 cm above the carina. Cardiomegaly with mild interstitial edema and suspected right pleural effusion. Electronically Signed   By: Charline BillsSriyesh  Krishnan M.D.   On: 05/13/2016 11:53    Studies: Echo 3/06 >> EF 65 to 70%, mod LVH, mod TR, PAS 39 mmHg CT head 3/7 >> no acute abnormality  CT ABD/Pelvis  3/10 >> large right retroperitoneal hematoma, small bilateral pleural effusions with extensive consolidation/atelectasis  Antibiotics: Zosyn 3/06 >> 3/08 Rocephin (H.Flu PNA) 3/08 >>  Cultures: Urine 3/05 >> negative Blood 3/06 >> Sputum 3/06 >> Haemophilus influenzae (beta lactamase positive)  Lines/tubes: ETT 3/05 >> 3/8 Lt IJ CVL 3/07 >>   Events: 3/05  [facility]08  extubated off pressors. No longer meeting sirs criteria  3/10  Reintubated, concern for bleed with drop in Hgb on heparin gtt, resp distress, AF w RVR.   3/11  Received 4 FFP overnight, 2 units PRBC  3/11  Remains on high dose vasopressors, CT c/w retroperitoneal bleed  Impression/plan:   Acute on Chronic Hypoxic and Hypercarbic Respiratory Failure in setting of aspiration PNA (H Flu Positive) superimposed on underlying OSA/OHS. Extubated 3/8.  Reintubated 3/10 with suspected bleeding (anemia, abd pain, AF, resp distress) - PRVC 8 cc/kg - wean PEEP / FiO2 for sats >92% - continue rocephin, D6/7 - intermittent CXR - PRN albuterol  - assess ABG now  Hemorrhagic Shock - in setting of retroperitoneal bleeding  H/o AF, HLD and HTN  - no further heparin products - neo, vaso, levo for MAP>65 - volume resuscitation with PRBC's - LR at 75 ml/hr - hold home norvasc, cozaar, lopressor - trend CVP  - add stress dose steroids   Non-Gap Metabolic Acidosis - lactic + possible RTA - monitor acid base  - no indication for bicarbonate at this time - ensure adequate renal perfusion - trend lactate   Retroperitoneal Bleed  - no further anticoagulation  - Additional PRBC 3/11 am  Anemia of critical illness - acute drop on 3/10, concern for RP bleed Thrombocytopenia  - trend CBC BID for now - PRBC as above - monitor Hgb closely  Hypothyroidism Gout   -continue synthroid - hold allopurinol   Remote CVA w/ right sided hemiparesis.  W/c bound at baseline.  - PT efforts once stabilized  Morbid Obesity   - resume TF 3/11 pm   DVT prophylaxis:  SCD's, hold heparin 3/10 SUP: PPI  Diet: NPO, TF  Activity: bedrest Disposition : ICU   NP CC Time: 35 minutes   Canary Brim, NP-C [facility] Pgr: [phone] or if no answer [phone] 05/14/2016, 10:15 AM

## 2016-05-14 NOTE — Progress Notes (Signed)
eLink Physician-Brief Progress Note Patient Name: Omar FellingWilliam R [name] DOB: [birth year] MRN: [id number]   Date of Service  05/14/2016  HPI/Events of Note  Called for hypotension.  Obese male on vent with RP bleed and afib rvr.  Hgb 7.1 this am and was 7.5 at 1730 yesterday.  Receiving FFP.  On neo and levo at high doses.  Not sure if bp accurate with cuff.  bp very labile.  eICU Interventions  Check lactic acid Place arterial line     Intervention Category Major Interventions: Hypotension - evaluation and management  Henry RusselSMITH, Kamori Kitchens, P 05/14/2016, 5:13 AM

## 2016-05-14 NOTE — Progress Notes (Signed)
eLink Physician-Brief Progress Note Patient Name: Ignacia FellingWilliam R Ramires DOB: Jul 13, 1945 MRN: [id number]   Date of Service  05/14/2016  HPI/Events of Note  Anemia - Hgb = 6.8. Retroperitoneal bleed.   eICU Interventions  Will transfuse 1 unit PRBC now.      Intervention Category Major Interventions: Other:  [name],Shun Pletz Eugene 05/14/2016, 6:28 PM

## 2016-05-14 NOTE — Progress Notes (Signed)
Granddaughter Oneida Alar(Quanisha) updated at bedside in full on patients illness.  She is questioning why he was on heparin infusion.  Explained transition from his chronic anticoagulation (coumadin) due to prior CVA & atrial fibrillation to heparin in the hospital setting.  She was concerned that "we were practicing trial and error and we were told he couldn't be on heparin".  Explained that the patient has tolerated coumadin for years and during acute illness, the standard of care is to transition to heparin.  Explained what a retroperitoneal bleed consists of and the treatment for such.  Also reviewed that the heparin level was therapeutic.  Reviewed risks of bleeding on anticoagulation (any anticoagulation).  The granddaughter requested a copy of his medical records.  I informed her she would have to sign a release of information page and complete this via medical records.  She got up in the middle of the conversation to get her POA form and left.    As I left the patients room, the daughter Alessandra Grout(Jevanah) was on the phone.  Per staff, she demanded to speak to the doctor. I again updated the daughter at length on the status of her father.  Extensive review of his illness (H.Flu PNA > intubation with critical illness > extubated > spontaneous retroperitoneal bleed).  She also stated that we were "trial and error practicing on him".  Again, I reviewed that her father received the standard of medical care.  Reinforced that we are maximally supporting him with multiple blood pressure agents, blood products and mechanical ventilation.  She stated "your going to have to deal with my daughter, he got worse on your watch".  She then hung up on me.     Approximately 40 minutes spent updating the family.    Canary BrimBrandi Ollis, NP-C [facility] Pgr: [phone] or if no answer [phone] 05/14/2016, 11:41 AM

## 2016-05-15 ENCOUNTER — Inpatient Hospital Stay (HOSPITAL_COMMUNITY): Payer: Medicare Other

## 2016-05-15 ENCOUNTER — Telehealth: Payer: Self-pay | Admitting: Family Medicine

## 2016-05-15 DIAGNOSIS — Z886 Allergy status to analgesic agent status: Secondary | ICD-10-CM

## 2016-05-15 DIAGNOSIS — E878 Other disorders of electrolyte and fluid balance, not elsewhere classified: Secondary | ICD-10-CM

## 2016-05-15 DIAGNOSIS — Z87891 Personal history of nicotine dependence: Secondary | ICD-10-CM

## 2016-05-15 DIAGNOSIS — R58 Hemorrhage, not elsewhere classified: Secondary | ICD-10-CM

## 2016-05-15 DIAGNOSIS — Z8249 Family history of ischemic heart disease and other diseases of the circulatory system: Secondary | ICD-10-CM

## 2016-05-15 DIAGNOSIS — Z7189 Other specified counseling: Secondary | ICD-10-CM

## 2016-05-15 DIAGNOSIS — Z888 Allergy status to other drugs, medicaments and biological substances status: Secondary | ICD-10-CM

## 2016-05-15 DIAGNOSIS — N179 Acute kidney failure, unspecified: Secondary | ICD-10-CM

## 2016-05-15 DIAGNOSIS — Z789 Other specified health status: Secondary | ICD-10-CM

## 2016-05-15 DIAGNOSIS — I482 Chronic atrial fibrillation: Secondary | ICD-10-CM

## 2016-05-15 DIAGNOSIS — T45515A Adverse effect of anticoagulants, initial encounter: Secondary | ICD-10-CM

## 2016-05-15 DIAGNOSIS — K661 Hemoperitoneum: Secondary | ICD-10-CM

## 2016-05-15 LAB — BLOOD GAS, ARTERIAL
ACID-BASE DEFICIT: 3.3 mmol/L — AB (ref 0.0–2.0)
Bicarbonate: 21.9 mmol/L (ref 20.0–28.0)
DRAWN BY: 406621
FIO2: 50
LHR: 16 {breaths}/min
O2 SAT: 94.4 %
PATIENT TEMPERATURE: 98.6
PCO2 ART: 44 mmHg (ref 32.0–48.0)
PEEP: 5 cmH2O
PH ART: 7.317 — AB (ref 7.350–7.450)
PO2 ART: 77.2 mmHg — AB (ref 83.0–108.0)
VT: 540 mL

## 2016-05-15 LAB — GLUCOSE, CAPILLARY
GLUCOSE-CAPILLARY: 110 mg/dL — AB (ref 65–99)
GLUCOSE-CAPILLARY: 194 mg/dL — AB (ref 65–99)
GLUCOSE-CAPILLARY: 179 mg/dL — AB (ref 65–99)
Glucose-Capillary: 129 mg/dL — ABNORMAL HIGH (ref 65–99)
Glucose-Capillary: 177 mg/dL — ABNORMAL HIGH (ref 65–99)
Glucose-Capillary: 191 mg/dL — ABNORMAL HIGH (ref 65–99)

## 2016-05-15 LAB — CBC WITH DIFFERENTIAL/PLATELET
BASOS PCT: 0 %
Basophils Absolute: 0 10*3/uL (ref 0.0–0.1)
Basophils Absolute: 0 10*3/uL (ref 0.0–0.1)
Basophils Relative: 0 %
EOS PCT: 0 %
Eosinophils Absolute: 0 10*3/uL (ref 0.0–0.7)
Eosinophils Absolute: 0 10*3/uL (ref 0.0–0.7)
Eosinophils Relative: 0 %
HEMATOCRIT: 23.6 % — AB (ref 39.0–52.0)
HEMATOCRIT: 25.1 % — AB (ref 39.0–52.0)
Hemoglobin: 7.5 g/dL — ABNORMAL LOW (ref 13.0–17.0)
Hemoglobin: 8 g/dL — ABNORMAL LOW (ref 13.0–17.0)
LYMPHS ABS: 1.2 10*3/uL (ref 0.7–4.0)
LYMPHS PCT: 4 %
Lymphocytes Relative: 6 %
Lymphs Abs: 0.8 10*3/uL (ref 0.7–4.0)
MCH: 30.4 pg (ref 26.0–34.0)
MCH: 30.1 pg (ref 26.0–34.0)
MCHC: 31.8 g/dL (ref 30.0–36.0)
MCHC: 31.9 g/dL (ref 30.0–36.0)
MCV: 95.5 fL (ref 78.0–100.0)
MCV: 94.4 fL (ref 78.0–100.0)
MONO ABS: 1.7 10*3/uL — AB (ref 0.1–1.0)
Monocytes Absolute: 1.3 10*3/uL — ABNORMAL HIGH (ref 0.1–1.0)
Monocytes Relative: 9 %
Monocytes Relative: 7 %
NEUTROS ABS: 17.1 10*3/uL — AB (ref 1.7–7.7)
Neutro Abs: 16.5 10*3/uL — ABNORMAL HIGH (ref 1.7–7.7)
Neutrophils Relative %: 85 %
Neutrophils Relative %: 89 %
Platelets: 107 10*3/uL — ABNORMAL LOW (ref 150–400)
Platelets: 140 10*3/uL — ABNORMAL LOW (ref 150–400)
RBC: 2.47 MIL/uL — ABNORMAL LOW (ref 4.22–5.81)
RBC: 2.66 MIL/uL — AB (ref 4.22–5.81)
RDW: 20 % — AB (ref 11.5–15.5)
RDW: 18.8 % — ABNORMAL HIGH (ref 11.5–15.5)
WBC: 19.4 10*3/uL — ABNORMAL HIGH (ref 4.0–10.5)
WBC: 19.2 10*3/uL — AB (ref 4.0–10.5)

## 2016-05-15 LAB — LACTIC ACID, PLASMA: Lactic Acid, Venous: 4.6 mmol/L (ref 0.5–1.9)

## 2016-05-15 LAB — BPAM FFP
BLOOD PRODUCT EXPIRATION DATE: 201803152359
BLOOD PRODUCT EXPIRATION DATE: 201803152359
BLOOD PRODUCT EXPIRATION DATE: 201803152359
Blood Product Expiration Date: 201803152359
ISSUE DATE / TIME: 201803102305
ISSUE DATE / TIME: 201803110439
ISSUE DATE / TIME: 201803102156
ISSUE DATE / TIME: 201803110439
Unit Type and Rh: 1700
Unit Type and Rh: 7300
Unit Type and Rh: 7300
Unit Type and Rh: 7300

## 2016-05-15 LAB — POCT I-STAT 3, ART BLOOD GAS (G3+)
Acid-base deficit: 7 mmol/L — ABNORMAL HIGH (ref 0.0–2.0)
BICARBONATE: 19.3 mmol/L — AB (ref 20.0–28.0)
O2 SAT: 93 %
PCO2 ART: 40.6 mmHg (ref 32.0–48.0)
PO2 ART: 72 mmHg — AB (ref 83.0–108.0)
Patient temperature: 36.5
TCO2: 21 mmol/L (ref 0–100)
pH, Arterial: 7.283 — ABNORMAL LOW (ref 7.350–7.450)

## 2016-05-15 LAB — URINALYSIS, ROUTINE W REFLEX MICROSCOPIC
BILIRUBIN URINE: NEGATIVE
GLUCOSE, UA: 100 mg/dL — AB
KETONES UR: 15 mg/dL — AB
Nitrite: NEGATIVE
PH: 5 (ref 5.0–8.0)
Protein, ur: 100 mg/dL — AB
Specific Gravity, Urine: 1.02 (ref 1.005–1.030)

## 2016-05-15 LAB — TROPONIN I
Troponin I: 0.83 ng/mL (ref <0.03)
Troponin I: 0.81 ng/mL (ref <0.03)

## 2016-05-15 LAB — CBC
HCT: 23.5 % — ABNORMAL LOW (ref 39.0–52.0)
HCT: 23.1 % — ABNORMAL LOW (ref 39.0–52.0)
HEMATOCRIT: 24 % — AB (ref 39.0–52.0)
HEMOGLOBIN: 7.6 g/dL — AB (ref 13.0–17.0)
HEMOGLOBIN: 7.5 g/dL — AB (ref 13.0–17.0)
HEMOGLOBIN: 7.3 g/dL — AB (ref 13.0–17.0)
MCH: 30.2 pg (ref 26.0–34.0)
MCH: 30.1 pg (ref 26.0–34.0)
MCH: 29.7 pg (ref 26.0–34.0)
MCHC: 31.7 g/dL (ref 30.0–36.0)
MCHC: 31.9 g/dL (ref 30.0–36.0)
MCHC: 31.6 g/dL (ref 30.0–36.0)
MCV: 95.2 fL (ref 78.0–100.0)
MCV: 94.4 fL (ref 78.0–100.0)
MCV: 93.9 fL (ref 78.0–100.0)
Platelets: 123 10*3/uL — ABNORMAL LOW (ref 150–400)
Platelets: 115 10*3/uL — ABNORMAL LOW (ref 150–400)
Platelets: 122 10*3/uL — ABNORMAL LOW (ref 150–400)
RBC: 2.52 MIL/uL — ABNORMAL LOW (ref 4.22–5.81)
RBC: 2.49 MIL/uL — AB (ref 4.22–5.81)
RBC: 2.46 MIL/uL — ABNORMAL LOW (ref 4.22–5.81)
RDW: 19.9 % — AB (ref 11.5–15.5)
RDW: 19.4 % — ABNORMAL HIGH (ref 11.5–15.5)
RDW: 18.5 % — AB (ref 11.5–15.5)
WBC: 19.3 10*3/uL — ABNORMAL HIGH (ref 4.0–10.5)
WBC: 19.5 10*3/uL — AB (ref 4.0–10.5)
WBC: 17 10*3/uL — ABNORMAL HIGH (ref 4.0–10.5)

## 2016-05-15 LAB — URINALYSIS, MICROSCOPIC (REFLEX)

## 2016-05-15 LAB — PROTIME-INR
INR: 3.93
INR: 2.75
Prothrombin Time: 39.5 seconds — ABNORMAL HIGH (ref 11.4–15.2)
Prothrombin Time: 29.7 seconds — ABNORMAL HIGH (ref 11.4–15.2)

## 2016-05-15 LAB — COMPREHENSIVE METABOLIC PANEL
ALK PHOS: 104 U/L (ref 38–126)
ALT: 2019 U/L — AB (ref 17–63)
AST: 3667 U/L — AB (ref 15–41)
Albumin: 2.9 g/dL — ABNORMAL LOW (ref 3.5–5.0)
Anion gap: 16 — ABNORMAL HIGH (ref 5–15)
BUN: 46 mg/dL — AB (ref 6–20)
CALCIUM: 8 mg/dL — AB (ref 8.9–10.3)
CHLORIDE: 108 mmol/L (ref 101–111)
CO2: 18 mmol/L — AB (ref 22–32)
CREATININE: 3.53 mg/dL — AB (ref 0.61–1.24)
GFR calc Af Amer: 18 mL/min — ABNORMAL LOW (ref >60)
GFR, EST NON AFRICAN AMERICAN: 16 mL/min — AB (ref >60)
Glucose, Bld: 127 mg/dL — ABNORMAL HIGH (ref 65–99)
Potassium: 5.2 mmol/L — ABNORMAL HIGH (ref 3.5–5.1)
Sodium: 142 mmol/L (ref 135–145)
Total Bilirubin: 5 mg/dL — ABNORMAL HIGH (ref 0.3–1.2)
Total Protein: 6.9 g/dL (ref 6.5–8.1)

## 2016-05-15 LAB — PREPARE FRESH FROZEN PLASMA
UNIT DIVISION: 0
Unit division: 0
Unit division: 0
Unit division: 0

## 2016-05-15 LAB — MAGNESIUM: MAGNESIUM: 2 mg/dL (ref 1.7–2.4)

## 2016-05-15 LAB — SODIUM, URINE, RANDOM: Sodium, Ur: 94 mmol/L

## 2016-05-15 LAB — PREPARE RBC (CROSSMATCH)

## 2016-05-15 LAB — CREATININE, URINE, RANDOM: Creatinine, Urine: 50.58 mg/dL

## 2016-05-15 LAB — PHOSPHORUS: Phosphorus: 6.1 mg/dL — ABNORMAL HIGH (ref 2.5–4.6)

## 2016-05-15 MED ORDER — PRO-STAT SUGAR FREE PO LIQD
30.0000 mL | Freq: Four times a day (QID) | ORAL | Status: DC
  Administered 2016-05-15 – 2016-05-31 (×56): 30 mL
  Filled 2016-05-15 (×66): qty 30

## 2016-05-15 MED ORDER — SODIUM CHLORIDE 0.9 % IV SOLN
Freq: Once | INTRAVENOUS | Status: AC
  Administered 2016-05-15: 11:00:00 via INTRAVENOUS

## 2016-05-15 MED ORDER — PRISMASOL BGK 4/2.5 32-4-2.5 MEQ/L IV SOLN
INTRAVENOUS | Status: DC
  Administered 2016-05-15 – 2016-05-24 (×16): via INTRAVENOUS_CENTRAL
  Filled 2016-05-15 (×21): qty 5000

## 2016-05-15 MED ORDER — PRISMASOL BGK 4/2.5 32-4-2.5 MEQ/L IV SOLN
INTRAVENOUS | Status: DC
  Administered 2016-05-15 – 2016-05-24 (×71): via INTRAVENOUS_CENTRAL
  Filled 2016-05-15 (×103): qty 5000

## 2016-05-15 MED ORDER — SODIUM CHLORIDE 0.9 % IV SOLN
Freq: Once | INTRAVENOUS | Status: AC
  Administered 2016-05-15: 21:00:00 via INTRAVENOUS

## 2016-05-15 MED ORDER — PRISMASOL BGK 4/2.5 32-4-2.5 MEQ/L IV SOLN
INTRAVENOUS | Status: DC
  Administered 2016-05-15 – 2016-05-24 (×16): via INTRAVENOUS_CENTRAL
  Filled 2016-05-15 (×21): qty 5000

## 2016-05-15 MED ORDER — ANTICOAGULANT SODIUM CITRATE 4% (200MG/5ML) IV SOLN
5.0000 mL | Status: DC | PRN
  Administered 2016-05-24 – 2016-05-26 (×2): 2.4 mL via INTRAVENOUS_CENTRAL
  Administered 2016-05-30: 5 mL via INTRAVENOUS_CENTRAL
  Filled 2016-05-15 (×9): qty 250

## 2016-05-15 MED ORDER — SODIUM POLYSTYRENE SULFONATE 15 GM/60ML PO SUSP
30.0000 g | Freq: Once | ORAL | Status: AC
  Administered 2016-05-15: 30 g via ORAL
  Filled 2016-05-15: qty 120

## 2016-05-15 MED ORDER — SODIUM CHLORIDE 0.9 % IV SOLN
Freq: Once | INTRAVENOUS | Status: AC
Start: 2016-05-15 — End: 2016-05-15
  Administered 2016-05-15: 16:00:00 via INTRAVENOUS

## 2016-05-15 MED ORDER — INSULIN ASPART 100 UNIT/ML ~~LOC~~ SOLN
0.0000 [IU] | SUBCUTANEOUS | Status: DC
  Administered 2016-05-15 – 2016-05-16 (×2): 2 [IU] via SUBCUTANEOUS
  Administered 2016-05-16: 1 [IU] via SUBCUTANEOUS
  Administered 2016-05-16: 2 [IU] via SUBCUTANEOUS
  Administered 2016-05-16 – 2016-05-18 (×9): 1 [IU] via SUBCUTANEOUS
  Administered 2016-05-18 (×3): 2 [IU] via SUBCUTANEOUS
  Administered 2016-05-19 – 2016-05-26 (×20): 1 [IU] via SUBCUTANEOUS
  Administered 2016-05-26: 2 [IU] via SUBCUTANEOUS
  Administered 2016-05-26 – 2016-05-27 (×7): 1 [IU] via SUBCUTANEOUS
  Administered 2016-05-27: 2 [IU] via SUBCUTANEOUS
  Administered 2016-05-27 – 2016-05-28 (×6): 1 [IU] via SUBCUTANEOUS
  Administered 2016-05-29: 2 [IU] via SUBCUTANEOUS
  Administered 2016-05-29 – 2016-06-05 (×19): 1 [IU] via SUBCUTANEOUS
  Administered 2016-06-20: 2 [IU] via SUBCUTANEOUS
  Administered 2016-06-20 (×3): 1 [IU] via SUBCUTANEOUS

## 2016-05-15 MED ORDER — VITAL HIGH PROTEIN PO LIQD: 1000.0000 mL | ORAL | Status: DC

## 2016-05-15 MED ORDER — SODIUM CHLORIDE 0.9 % FOR CRRT
INTRAVENOUS_CENTRAL | Status: DC | PRN
  Filled 2016-05-15: qty 1000

## 2016-05-15 MED ORDER — DEXTROSE 5 % IV SOLN
2.0000 g | INTRAVENOUS | Status: AC
  Administered 2016-05-16 – 2016-05-17 (×2): 2 g via INTRAVENOUS
  Filled 2016-05-15 (×2): qty 2

## 2016-05-15 MED ORDER — SODIUM BICARBONATE 8.4 % IV SOLN
INTRAVENOUS | Status: DC
  Administered 2016-05-15 – 2016-05-16 (×3): via INTRAVENOUS
  Filled 2016-05-15 (×5): qty 150

## 2016-05-15 NOTE — Assessment & Plan Note (Addendum)
High residuals, continue trickle TF without advancing for now.

## 2016-05-15 NOTE — Progress Notes (Signed)
Hypoglycemic Event  CBG: 48  Treatment: D50 IV 50 mL  Symptoms: None  Follow-up CBG: Time:21:17 CBG Result:65  Possible Reasons for Event: Unknown  Comments/MD notified:Hypoglyemia protocol; CBG 65 covered with additional D50 IV 50 mL; follow up CBG 150 @ 21:51. Will continue to monitor.    BShepherd, RN

## 2016-05-15 NOTE — Progress Notes (Signed)
CRITICAL VALUE ALERT  Critical value received:  Troponin 0.83  Date of notification:  05/18/16  Time of notification:  1400  Critical value read back:Yes.    Nurse who received alert:  Mardella LaymanLindsey Deseray Daponte RN  MD notified (1st page):  ramaswamy  Time of first page:  1400  MD notified (2nd page):  Time of second page:  Responding MD:  Marchelle Gearingamaswamy  Time MD responded:  1400

## 2016-05-15 NOTE — Assessment & Plan Note (Addendum)
Hx CVA. Will likely influence ability to wean

## 2016-05-15 NOTE — Progress Notes (Signed)
eLink Physician-Brief Progress Note Patient Name: Omar FellingWilliam R [name] DOB: 11-02-1945 MRN: [id number]   Date of Service  05/15/2016  HPI/Events of Note  Request to review CXR for R IJ dialysis catheter placement. R IJ catheter tip in mid SVC. No pneumothorax.   eICU Interventions  OK to use R IJ dialysis catheter.      Intervention Category Major Interventions: Other:  [name],Steven Eugene 05/15/2016, 10:01 PM

## 2016-05-15 NOTE — Assessment & Plan Note (Addendum)
RASS -3 currently on dilaudid.  Continue current sedation / pain control.

## 2016-05-15 NOTE — Progress Notes (Signed)
PT Cancellation Note/ Discharge  Patient Details Name: Omar [name] MRN: [id number] DOB: 04-Jul-1945   Cancelled Treatment:    Reason Eval/Treat Not Completed: Patient not medically ready (pt remains intubated, not medically appropriate. Will sign off and await new order)   Omar [name] 05/15/2016, 7:11 AM  Omar [name], PT [phone]

## 2016-05-15 NOTE — Assessment & Plan Note (Addendum)
Large retroperitoneal bleed, note slight enlargement CT 3/18 ? Component adrenal insufficiency Continue current pressors and wean as able goal MAP > 65

## 2016-05-15 NOTE — Progress Notes (Signed)
Nutrition Consult / Follow-up  DOCUMENTATION CODES:   Morbid obesity  INTERVENTION:    Vital High Protein at 50 ml/h (1200 ml per day)  Pro-stat 30 ml QID  Provides 1600 kcal, 165 gm protein, 1204 ml free water daily  NUTRITION DIAGNOSIS:   Inadequate oral intake related to inability to eat as evidenced by NPO status.  Ongoing  GOAL:   Provide needs based on ASPEN/SCCM guidelines  Progressing  MONITOR:   Vent status, TF tolerance, I & O's, Labs  REASON FOR ASSESSMENT:   Consult Enteral/tube feeding initiation and management  ASSESSMENT:   71 y/o male from [facility] admitted 3/5 with altered mental status with aspiration, sepsis, hypoxic/hypercapnic respiratory failure.    Extubated 3/8 Re-intubated 3/10 Received MD Consult for TF initiation and management. Patient is currently intubated on ventilator support Temp (24hrs), Avg:97.8 F (36.6 C), Min:97.5 F (36.4 C), Max:99.5 F (37.5 C)  Propofol: none Labs reviewed: potassium 5.2 (H), phosphorus 6.1 (H) Medications reviewed.  Diet Order:  Diet NPO time specified  Skin:  Reviewed, no issues  Last BM:  3/11  Height:   Ht Readings from Last 1 Encounters:  05/09/16 5\' 6"  (1.676 m)    Weight:   Wt Readings from Last 1 Encounters:  05/15/16 (!) 386 lb (175.1 kg)    Ideal Body Weight:  64.5 kg  BMI:  Body mass index is 62.3 kg/m.  Estimated Nutritional Needs:   Kcal:  [id number]  Protein:  161 gm  Fluid:  2-2.5 L  EDUCATION NEEDS:   No education needs identified at this time  Joaquin CourtsKimberly Valla Pacey, RD, LDN, CNSC Pager [phone] After Hours Pager [phone]

## 2016-05-15 NOTE — Assessment & Plan Note (Addendum)
Full code. Family very upset at outcome - see 05/14/16 and 05/15/16 High mortality situation and defnitely high morbidity situation  On 05/17/16 - daughter at bedside: very upset that Iv Heparin gtt was given. Says that in 2013 for A Fib stroke admit got IV heparin gtt and bled around heart. Upset to heart hat this is not in record because [facility]. She says other anticoagulants including coumadin fine but not IV heparing ttt. [facility] that [facility] to staff that patient should never get IV heparin gttt (she did this a priori per her). Upset that we did not take this into account and list in allergy list and still gave IV heparin gtt. REports that staff used words like "Trial and Error".    On 05/19/2016 and 05/20/2016  And 05/21/2016- none at bedside but RN Updated them  Plan Will continue to update and support family.

## 2016-05-15 NOTE — Progress Notes (Signed)
CRITICAL VALUE ALERT  Critical value received:  Lactic acid 4.6  Date of notification:  05/15/16  Time of notification:  1203  Critical value read back:Yes.    Nurse who received alert:  Mardella LaymanLindsey Elliona Doddridge RN  MD notified (1st page):  Ramaswamy  Time of first page:  1300  MD notified (2nd page):  Time of second page:  Responding MD:  Marchelle Gearingramaswamy  Time MD responded:  1300

## 2016-05-15 NOTE — Progress Notes (Signed)
eLink Physician-Brief Progress Note Patient Name: Omar FellingWilliam R Arboleda DOB: June 24, 1945 MRN: [id number]   Date of Service  05/15/2016  HPI/Events of Note  Hgb = 7.3, INR = 2.75, Platelets = 122 and Blood glucose = 190.  eICU Interventions  Will order: 1. Transfuse 3 units FFP now. 2. Q 4 hour sensitive Novolog SSI.     Intervention Category Intermediate Interventions: Coagulopathy - evaluation and management  Anissia Wessells Eugene 05/15/2016, 8:48 PM

## 2016-05-15 NOTE — Progress Notes (Signed)
PCCM Progress Note  Admission date: 05/13/2016 Referring provider: Dr. Clarene Duke, [facility]  CC: altered mental status  HPI: 71 y/o male from [facility] admitted 3/5 with altered mental status with aspiration, sepsis, hypoxic/hypercapnic respiratory failure.     has a past medical history of Asthma; Atrial fibrillation (HCC); Gout; Hypertension; Morbid obesity (HCC); Multinodular goiter; Osteoarthrosis and allied disorders; Other and unspecified hyperlipidemia; Stroke [facility]); Thrombocytopenia (HCC); and Vitamin D deficiency.    Antibiotics: Zosyn 3/06 >> 3/08 Rocephin (H.Flu PNA) 3/08 >>  Cultures: Urine 3/05 >> negative Blood 3/06 >>   Lines/tubes: ETT 3/05 >> 3/8 Lt IJ CVL 3/07 >>   Events: 3/05  Transfer to [facility] Sputum 3/06 >> Haemophilus influenzae (beta lactamase positive) Echo 3/06 >> EF 65 to 70%, mod LVH, mod TR, PAS 39 mmHg CT head 3/7 >> no acute abnormality   3/08  extubated off pressors. No longer meeting sirs criteria  3/10  Reintubated, concern for bleed with drop in Hgb on heparin gtt, resp distress, AF w RVR.   CT ABD/Pelvis 3/10 >> large right retroperitoneal hematoma, small bilateral pleural effusions with extensive consolidation/atelectasis 3/11  Received 4 FFP overnight, 2 units PRBC  3/11  Remains on high dose vasopressors, CT c/w retroperitoneal bleed  RN reports hypotension on levo + neo.  FFP infusing.     SUBJECTIVE/OVERNIGHT/INTERVAL HX 3/12 - max pressors. Shock liver. Deeply sedated. S/p 4 units prbc and 4 unit ffp as of last night  Vital signs: BP (!) 67/43   Pulse (!) 107   Temp 98.1 F (36.7 C)   Resp 17   Ht 5\' 6"  (1.676 m)   Wt (!) 175.1 kg (386 lb)   SpO2 95%   BMI 62.30 kg/m   Intake/output:  Intake/Output Summary (Last 24 hours) at 05/15/16 1010 Last data filed at 05/15/16 1000  Gross per 24 hour  Intake             6756 ml  Output               15 ml  Net             6741 ml    General Appearance:    Looks criticall ill OBESE - yes   Head:    Normocephalic, without obvious abnormality, atraumatic  Eyes:    PERRL - eequal, conjunctiva/corneas - clear      Ears:    Normal external ear canals, both ears  Nose:   NG tube - no  Throat:  ETT TUBE - yes , OG tube - yes  Neck:   Supple,  No enlargement/tenderness/nodules     Lungs:     Clear to auscultation bilaterally, Ventilator   Synchrony - yes  Chest wall:    No deformity  Heart:    S1 and S2 normal, no murmur, CVP - na.  Pressors - refractory shock  Abdomen:     Soft, no masses, no organomegaly  Genitalia:    Not done  Rectal:   not done  Extremities:   Extremities- edema     Skin:   Intact in exposed areas . Sacral area - no decub     Neurologic:   Sedation - prn -> RASS - -2. Does move left arm.  Rt sided old hemiplegia. Coughs + gag +      PULMONARY  Recent Labs Lab 05/12/16 0342 05/13/16 1211 05/13/16 2050 05/14/16 0727 05/14/16 1044 05/15/16 0333  PHART 7.253* 7.318* 7.243* 7.288* 7.252* 7.283*  PCO2ART 64.1* 53.8* 56.5* 41.1 44.1 40.6  PO2ART 71.0* 89.0 78.0* 72.0* 84.0 72.0*  HCO3 28.0 27.6 23.2 19.7* 19.3* 19.3*  TCO2 30 29  --  21 21 21   O2SAT 89.0 96.0 92.8 92.0 94.0 93.0    CBC  Recent Labs Lab 05/14/16 1711 05/15/16 0321 05/15/16 0745  HGB 6.8* 7.5* 7.6*  HCT 21.9* 23.6* 24.0*  WBC 16.6* 19.4* 19.3*  PLT 110* 107* 123*    COAGULATION  Recent Labs Lab 05/12/16 0320 05/13/16 0427 05/13/16 1659 05/14/16 0439 05/15/16 0340  INR 1.94 2.16 3.12 2.81 3.93    CARDIAC   Recent Labs Lab 06/01/2016 1736  TROPONINI <0.03   No results for input(s): PROBNP in the last 168 hours.   CHEMISTRY  Recent Labs Lab 05/10/16 1632 05/11/16 0400 05/12/16 0320 05/13/16 0427 05/14/16 0439 05/14/16 1257 05/14/16 1711 05/15/16 0340  NA  --  142 143 146* 144  --   --  142  K  --  3.3* 4.0 3.9 4.4  --   --  5.2*  CL  --  109 112* 107 109  --   --  108  CO2  --  27 28 31 22   --   --  18*  GLUCOSE  --  111* 85 98 67  --   --   127*  BUN  --  41* 30* 24* 35*  --   --  46*  CREATININE  --  1.32* 1.15 1.09 2.63*  --   --  3.53*  CALCIUM  --  8.3* 8.4* 8.5* 8.3*  --   --  8.0*  MG 1.9  --   --   --  1.8 1.6* 1.7 2.0  PHOS 4.2  --   --   --  5.2* 5.9* 6.1* 6.1*   Estimated Creatinine Clearance: 29 mL/min (by C-G formula based on SCr of 3.53 mg/dL (H)).   LIVER  Recent Labs Lab 05/05/2016 1736  05/12/16 0320 05/13/16 0427 05/13/16 1659 05/14/16 0439 05/15/16 0340  AST 20  --   --   --   --   --  1,610*  ALT 13*  --   --   --   --   --  2,019*  ALKPHOS 71  --   --   --   --   --  104  BILITOT 0.9  --   --   --   --   --  5.0*  PROT 8.5*  --   --   --   --   --  6.9  ALBUMIN 3.3*  --   --   --   --   --  2.9*  INR 2.37  < > 1.94 2.16 3.12 2.81 3.93  < > = values in this interval not displayed.   INFECTIOUS  Recent Labs Lab 05/09/16 0408 05/11/16 0400 05/13/16 0427 05/13/16 1139 05/13/16 1404 05/14/16 0607  LATICACIDVEN  --   --   --  4.5* 3.6* 8.3*  PROCALCITON 0.45 0.98 0.53  --   --   --      ENDOCRINE CBG (last 3)   Recent Labs  05/14/16 2344 05/15/16 0334 05/15/16 0736  GLUCAP 108* 129* 110*         IMAGING x48h  - image(s) personally visualized  -   highlighted in bold Ct Abdomen Pelvis Wo Contrast  Result Date: 05/13/2016 CLINICAL DATA:  Ct a/p wo abd / back pain on heparin gtt, with hgb drop  to 6.7 EXAM: CT ABDOMEN AND PELVIS WITHOUT CONTRAST TECHNIQUE: Multidetector CT imaging of the abdomen and pelvis was performed following the standard protocol without IV contrast. Image degradation secondary to body habitus. COMPARISON:  None available FINDINGS: Lower chest: Bilateral small pleural effusions. Patchy airspace consolidation/atelectasis posteriorly in the lung bases, right greater than left. Endotracheal tube terminates above the carina. No pneumothorax. Hepatobiliary: Unremarkable unenhanced scan of the liver. Hyperdense material in the gallbladder lumen possibly vicarious  excretion of contrast material. Pancreas: Grossly unremarkable Spleen: Normal in size without focal abnormality. Adrenals/Urinary Tract: Normal adrenals. No hydronephrosis or urolithiasis. Foley catheter decompresses urinary bladder. Stomach/Bowel: Nasogastric tube into decompressed stomach. Small bowel is nondilated. Appendix not discretely identified. Colon is nondilated. Vascular/Lymphatic: Scattered aortoiliac arterial plaque without aneurysm. No definite adenopathy localized. Reproductive: Prostate is unremarkable. Other: There is extensive right retroperitoneal mixed attenuation fluid collection extending cephalad lateral to the liver to the level of the hemidiaphragm, and caudad into the pelvic extraperitoneal space medial to the acetabulum. The collection measures up to 17.4 cm transverse diameter, the dominant component extending over a craniocaudal length of approximately 26.3 cm. The collection results in mass effect upon the right kidney displaced medially. No definite ascites.  No free air. Musculoskeletal: No definite displaced fracture or worrisome bone lesion. IMPRESSION: 1. Large right retroperitoneal hematoma as above. 2. Small bilateral pleural effusions with extensive consolidation/atelectasis posteriorly in both lung bases. Electronically Signed   By: Corlis Leak  Hassell M.D.   On: 05/13/2016 15:24   Dg Chest Port 1 View  Result Date: 05/15/2016 CLINICAL DATA:  Acute respiratory failure with hypoxia. EXAM: PORTABLE CHEST 1 VIEW COMPARISON:  05/14/2016. FINDINGS: Endotracheal tube in satisfactory position. Nasogastric tube extending into the stomach. Left jugular catheter tip in the proximal superior vena cava. Stable enlarged cardiac silhouette, bibasilar airspace opacity and right pleural effusion. Thoracic spine degenerative changes. IMPRESSION: Stable cardiomegaly, bilateral pulmonary edema and right pleural effusion. Electronically Signed   By: Beckie SaltsSteven  Reid M.D.   On: 05/15/2016 07:33   Dg  Chest Port 1 View  Result Date: 05/14/2016 CLINICAL DATA:  Acute respiratory failure with hypoxia EXAM: PORTABLE CHEST 1 VIEW COMPARISON:  Chest radiograph from one day prior. FINDINGS: Endotracheal tube tip is 2.4 cm above the carina. Enteric tube enters stomach with the tip not seen on this image. Stable cardiomediastinal silhouette with mild cardiomegaly. No pneumothorax. Stable small right pleural effusion. Mild to moderate pulmonary edema appears slightly improved. Bibasilar lung opacities appear stable. Surgical clips overlie the medial lower right neck. IMPRESSION: 1. Well-positioned support structures.  No pneumothorax. 2. Stable small right pleural effusion. 3. Mild to moderate congestive heart failure, slightly improved. 4. Bibasilar lung opacities, stable, favor atelectasis. Electronically Signed   By: Delbert PhenixJason A Poff M.D.   On: 05/14/2016 07:58   Portable Chest Xray  Result Date: 05/13/2016 CLINICAL DATA:  ETT placement EXAM: PORTABLE CHEST 1 VIEW COMPARISON:  05/12/2016 FINDINGS: Endotracheal tube terminates 3 cm above the carina. Bilateral lower lungs are obscured by overlying soft tissue. At least a right pleural effusion is suspected. No pneumothorax. Cardiomegaly with mild interstitial edema. IMPRESSION: Endotracheal tube terminates 3 cm above the carina. Cardiomegaly with mild interstitial edema and suspected right pleural effusion. Electronically Signed   By: Charline BillsSriyesh  Krishnan M.D.   On: 05/13/2016 11:53       ASSESSMENT and PLAN  Acute respiratory failure with hypercapnia (HCC) Due to MODS  Plan Full vent support  Shock circulatory (HCC) Currently due to massive severe RP hemorrhage  Plan Pressor support for MAP > 65  Retroperitoneal hemorrhage Large on CT 05/13/16 -= 05/14/16  Plan Support with blood products - give antother prbc and platelet stat Dc tube feeds Cbc q6h  Altered mental status RASS -2 with prn sedation  Plan Prn sedation  Hemiparesis affecting  right side as late effect of cerebrovascular accident [facility]) Old baseline issue  On enteral nutrition Dc tube feeds due to refractory shock  Goals of care, counseling/discussion Full code. Contentious issue with family yesterday - see 05/14/16 note PAtient actually dying from hemorrhabe  Plan Social work v chaplain consult  Acute renal failure (ARF) (HCC) Oliguric/anuric 05/15/2016 Too unstable for CRRT  Plan Kayexalate Start bicarb gtt Monitor acidosis  Electrolyte imbalance At riskf or hyperkalemia  Plan Dc LR Start bic gtt       FAMILY    - Inter-disciplinary family meet or Palliative Care meeting due by:  DAy 7. Current LOS is LOS 7 days  CODE STATUS    Code Status Orders        Start     Ordered   05/09/16 0131  Full code  Continuous     05/09/16 0133    Code Status History    Date Active Date Inactive Code Status Order ID Comments User Context   This patient has a current code status but no historical code status.        DISPO Keep in ICU       The patient is critically ill with multiple organ systems failure and requires high complexity decision making for assessment and support, frequent evaluation and titration of therapies, application of advanced monitoring technologies and extensive interpretation of multiple databases.   Critical Care Time devoted to patient care services described in this note is  35  Minutes. This time reflects time of care of this signee Dr Kalman Shan. This critical care time does not reflect procedure time, or teaching time or supervisory time of PA/NP/Med student/Med Resident etc but could involve care discussion time    Dr. Kalman Shan, M.D., [facility].C.P Pulmonary and Critical Care Medicine Staff Physician [facility]  Pulmonary and Critical Care Pager: [phone], If no answer or between  15:00h - 7:00h: call [phone]  05/15/2016 10:11 AM          Impression/plan:    Acute on Chronic Hypoxic and Hypercarbic Respiratory Failure in setting of aspiration PNA (H Flu Positive) superimposed on underlying OSA/OHS. Extubated 3/8.  Reintubated 3/10 with suspected bleeding (anemia, abd pain, AF, resp distress) - PRVC 8 cc/kg - wean PEEP / FiO2 for sats >92% - continue rocephin, D6/7 - intermittent CXR - PRN albuterol  - assess ABG now  Hemorrhagic Shock - in setting of retroperitoneal bleeding  H/o AF, HLD and HTN  - no further heparin products - neo, vaso, levo for MAP>65 - volume resuscitation with PRBC's - LR at 75 ml/hr - hold home norvasc, cozaar, lopressor - trend CVP  - add stress dose steroids   Non-Gap Metabolic Acidosis - lactic + possible RTA - monitor acid base  - no indication for bicarbonate at this time - ensure adequate renal perfusion - trend lactate   Retroperitoneal Bleed  - no further anticoagulation  - Additional PRBC 3/11 am  Anemia of critical illness - acute drop on 3/10, concern for RP bleed Thrombocytopenia  - trend CBC BID for now - PRBC as above - monitor Hgb closely  Hypothyroidism Gout   -  continue synthroid - hold allopurinol   Remote CVA w/ right sided hemiparesis.  W/c bound at baseline.  - PT efforts once stabilized  Morbid Obesity  - resume TF 3/11 pm   DVT prophylaxis:  SCD's, hold heparin 3/10 SUP: PPI  Diet: NPO, TF  Activity: bedrest Disposition : ICU   NP CC Time: 35 minutes   Canary Brim, NP-C [facility] Pulmonary & Critical Care Pgr: [phone] or if no answer [phone] 05/15/2016, 10:10 AM

## 2016-05-15 NOTE — Assessment & Plan Note (Addendum)
Replete electrolytes as indicated.

## 2016-05-15 NOTE — Assessment & Plan Note (Addendum)
Due PNA and then acute decompensation in setting hemorrhagic shock.  Push PSv as he can tolerate. MS precludes extubation at this time Discussions have been undertaken with his family - would want trach. Will consult ENT to perform

## 2016-05-15 NOTE — Progress Notes (Signed)
     Recent Labs Lab 05/15/16 0321 05/15/16 0745 05/15/16 1016  HGB 7.5* 7.6* 7.5*  HCT 23.6* 24.0* 23.5*  WBC 19.4* 19.3* 19.5*  PLT 107* 123* 115*    Recent Labs Lab 05/12/16 0320 05/13/16 0427 05/13/16 1659 05/14/16 0439 05/15/16 0340  INR 1.94 2.16 3.12 2.81 3.93    He is now s/p platelet and hgb this aM Will also give 2U FFP - d/with APP Jovita KussmaulKAtalina Eubanks who will placee order  Dr. Kalman ShanMurali Andrell Bergeson, M.D., [facility].C.P Pulmonary and Critical Care Medicine Staff Physician [facility] Pulmonary and Critical Care Pager: [phone], If no answer or between  15:00h - 7:00h: call [phone]  05/15/2016 3:04 PM

## 2016-05-15 NOTE — Consult Note (Signed)
Referring MD: Dr. Wille Glaser  PCP:  Redge Gainer, MD   Reason for Referral: Hematology evaluation for severe bleeding complication of anticoagulation with heparin  Chief Complaint  Patient presents with  . Altered Mental Status    HPI:  71 year old man admitted to [facility] on May 08, 2016 and subsequently transferred to this hospital to evaluate altered mental status.  He was hypotensive, hypoxic, hypercapnic, and required immediate intubation.  No acute intracranial abnormality seen on CT scan.  Chronic changes from previous left corona radiata/internal capsule infarct in 2012.  Chest x-ray with stable cardiomegaly but worsening interstitial and alveolar airspace opacities and a small left pleural effusion felt to be all consistent with CHF.  He required pressor support for his blood pressure.  He developed fever.  It was felt that he likely aspirated as the proximate cause of his initial deterioration and developed septic shock secondary to aspiration pneumonia.  Cultures taken from endotracheal secretions subsequently grew Haemophilus influenzae.  He was started on broad-spectrum parenteral antibiotics.  Condition initially stabilized, he came off pressors and he was extubated on March 9.   He was on warfarin for chronic atrial fibrillation stroke prophylaxis.  Initial INR 2.4.  PTT 39 seconds.  Warfarin was held.  He was started on unfractionated heparin IV infusion on March 8 when INR fell below 2.  He had an acute fall in blood pressure and in hemoglobin down to 6.7 g with sudden onset of abdominal and back pain on March 10 at about 5 AM..  CT scan showed extensive right retroperitoneal hemorrhage.  25 mg of protamine was given to reverse the heparin.  He received fluid resuscitation, transfusion, and had to be put back on pressor agents.   Of note, Xa heparin levels were monitored daily beginning on the first day of treatment March 8 and except for one value which was just borderline  over therapeutic range at 0.74 units on March 9, all other values were within the therapeutic range.  PTT on March 10 was 51 seconds with upper normal 36 also well within the therapeutic range.  A repeat value on March 11 was 43 seconds.   According to [facility] who met with the family this afternoon, the patient had a previous bleeding complication on heparin back in 2013.  He was on warfarin at the time he had his stroke in 2012.  I find no records in our system from 2013.  Next note is May 29, 2012 which was a routine Coumadin monitoring visit.  Heparin is not listed as a drug allergy or intolerance in the admission record.  Family currently not available for comment. He remains unresponsive at this time.  He has developed acute renal failure with creatinine 3.5.  He has developed shock liver with transaminases over 2000.  He remains on multiple pressor agents.  Liver functions normal on admission March 5.  Creatinine 1.2.  He was on no antiplatelet agents in addition to the chronic warfarin 7.5 mg daily.  He has had some fluctuation in his platelet count but has been consistently over 100,000.  Platelet count was 120,000 on the day of his retroperitoneal hemorrhage.    Past Medical History:  Diagnosis Date  . Asthma   . Atrial fibrillation ([place])   . Gout   . Hypertension   . Morbid obesity ([place])   . Multinodular goiter   . Osteoarthrosis and allied disorders   . Other and unspecified hyperlipidemia   . Stroke [facility])  right sided hemiparesis  . Thrombocytopenia ([place])   . Vitamin D deficiency   :  Past Surgical History:  Procedure Laterality Date  . THYROID SURGERY    :  . atorvastatin  20 mg Oral q1800  . [START ON 05/16/2016] cefTRIAXone (ROCEPHIN)  IV  2 g Intravenous Q24H  . chlorhexidine gluconate (MEDLINE KIT)  15 mL Mouth Rinse BID  . Chlorhexidine Gluconate Cloth  6 each Topical Daily  . feeding supplement (PRO-STAT SUGAR FREE 64)  30 mL Per Tube QID  . fentaNYL  (SUBLIMAZE) injection  50 mcg Intravenous Once  . hydrocortisone sod succinate (SOLU-CORTEF) inj  50 mg Intravenous Q6H  . levothyroxine  62.5 mcg Intravenous Daily  . mouth rinse  15 mL Mouth Rinse QID  . metoprolol  10 mg Intravenous Q6H  . pantoprazole (PROTONIX) IV  40 mg Intravenous Q24H  :  Allergies  Allergen Reactions  . Anacin Af [Acetaminophen]   . Niaspan [Niacin Er]   :  Family History  Problem Relation Age of Onset  . Coronary artery disease Brother   :  Social History   Social History  . Marital status: Widowed    Spouse name: N/A  . Number of children: N/A  . Years of education: N/A   Occupational History  . Not on file.   Social History Main Topics  . Smoking status: Former Research scientist (life sciences)  . Smokeless tobacco: Never Used     Comment: quit 12 + years ago   . Alcohol use No  . Drug use: No  . Sexual activity: No   Other Topics Concern  . Not on file   Social History Narrative   Married, 1 child. Retired from job as Dealer at [employer].   :  ROS: Unable to obtain.  Patient intubated and responds only to pain  Vitals: Vitals:   05/15/16 1645 05/15/16 1700  BP: 131/65   Pulse: 97 97  Resp: 16 16  Temp: 98.1 F (36.7 C) 98.1 F (36.7 C)    PHYSICAL EXAM: General appearance: Morbidly obese African-American man responsive only to pain.  He looks much younger than stated age. HEENT: He is intubated. Lymph Nodes: Resp: Anterior chest clear.  Incomplete exam. Cardio: Irregularly irregular cardiac rhythm.  No appreciable murmur. Vascular: Dorsalis pedis pulses 2+ symmetric Breasts: GI: Obese, soft, tender on palpation in the right upper quadrant and epigastrium no gross organomegaly GU: Foley catheter in place draining clear urine Extremities: 1-2+ pedal edema.  No cyanosis. Neurologic: Right hemiparesis from previous stroke.  Pupils equal round reactive to light.  No facial asymmetry.  Extremities are all tied down.  Babinski reflex  flexor. Skin: No ecchymoses.  No oozing of blood from invasive catheters or Foley.  Labs:   Recent Labs  05/15/16 1016 05/15/16 1523  WBC 19.5* 19.2*  HGB 7.5* 8.0*  HCT 23.5* 25.1*  PLT 115* 140*    Recent Labs  05/14/16 0439 05/15/16 0340  NA 144 142  K 4.4 5.2*  CL 109 108  CO2 22 18*  GLUCOSE 67 127*  BUN 35* 46*  CREATININE 2.63* 3.53*  CALCIUM 8.3* 8.0*      Assessment: Acute retroperitoneal hemorrhage while on therapeutic unfractionated heparin.  I recently reviewed this subject and there is a paucity of data.  Major hemorrhage on warfarin ranges between 0.6 and 6.6% with most studies and texts recording a cumulative incidence of 1% per year.  Estimated incidence of significant hemorrhage on heparin is 2-5 times  higher than warfarin.  Of 51 cases reviewed in one report on retroperitoneal hemorrhage, most of these happened while the drug was in the therapeutic range.  Age over 71 and concomitant use of antiplatelet agents were the only obvious risk factors.  In the only prospective trial in the literature looking at heparin related  hemorrhage which was published in the Lancet in 1977, of 76 patients on heparin, a third had some bleeding, bleeding was severe in 13%, and retroperitoneal bleed was seen in 5 of the 76 (6.6%).  Bleeding was uncommon during the first 2 days of treatment.  Subsequently the daily frequency remained relatively constant.  Authors note, neither the heparin dose or the PTT results correlated with the bleeding complications.  Once again, patients with major non-traumatic bleeds were significantly older, over the age of 24. It is also been noted for warfarin that one third to one half of patients who bleed on this drug bleed when they are at a therapeutic INR. I did not find any evidence of a genetic susceptibility to hemorrhage for either warfarin or heparin.  It is certainly disconcerting that many of the hemorrhages that occur on anticoagulants  occurred when the drugs are in the therapeutic range.   Recommendation: Based on the data above, I do not think that there was any way to predict that this was going to happen.  Likewise, there would be no guidelines that would help assess risk in the future if the patient's condition should stabilize and he needs to go back on anticoagulation.       Murriel Hopper, MD, [place]  Hematology-Oncology/Internal Medicine  05/15/2016, 5:05 PM

## 2016-05-15 NOTE — Procedures (Signed)
Hemodialysis Catheter Insertion Procedure Note Ignacia FellingWilliam R Paiva [id number] November 29, 1945  Procedure: Insertion of Hemodialysis Catheter Indications: CRRT  Procedure Details Consent: Risks of procedure as well as the alternatives and risks of each were explained to the (patient/caregiver).  Consent for procedure obtained. Time Out: Verified patient identification, verified procedure, site/side was marked, verified correct patient position, special equipment/implants available, medications/allergies/relevent history reviewed, required imaging and test results available.  Performed  Maximum sterile technique was used including antiseptics, cap, gloves, gown, hand hygiene, mask and sheet. Skin prep: Chlorhexidine; local anesthetic administered A antimicrobial bonded/coated triple lumen catheter was placed in the right internal jugular vein using the Seldinger technique.  Evaluation Blood flow good Complications: No apparent complications Patient did tolerate procedure well. Chest X-ray ordered to verify placement.  CXR: pending.  Procedure performed under direct ultrasound guidance for real time vessel cannulation.      Rutherford Guysahul Naftuli Dalsanto, [place] - [place] Pulmonary & Critical Care Medicine Pgr: [phone]  or [phone] 05/15/2016, 9:03 PM

## 2016-05-15 NOTE — Consult Note (Signed)
Reason for Consult: Acute kidney injury Referring Physician: Brand Males M.D. (CCM)  HPI:  71 year old African-American man with past medical history significant for hypertension, morbid obesity, multinodular goiter, history of CVA, atrial fibrillation on chronic anticoagulation with Coumadin and bronchial asthma. He was admitted from [facility] on 05/28/2016 with altered mental status, aspiration, sepsis and hypoxic/hypercapnic respiratory failure stemming from Haemophilus influenza pneumonia. Baseline creatinine appears to have ranged from 1.1-1.3.  Unfortunately, 3 days ago, he developed progressive hypotension with worsening of mentation and acute blood loss anemia secondary to a large retroperitoneal hemorrhage with consequent development of hemorrhagic shock following which he is on maximum doses of pressors and has developed acute kidney injury with anuria. A noncontrasted CT scan of the abdomen shows mass effect upon the right kidney that is displaced medially by the large hematoma-no hydronephrosis noted.  His family members have expressed a lot of anger and frustration.  Past Medical History:  Diagnosis Date  . Asthma   . Atrial fibrillation ([place])   . Gout   . Hypertension   . Morbid obesity ([place])   . Multinodular goiter   . Osteoarthrosis and allied disorders   . Other and unspecified hyperlipidemia   . Stroke [facility])    right sided hemiparesis  . Thrombocytopenia ([place])   . Vitamin D deficiency     Past Surgical History:  Procedure Laterality Date  . THYROID SURGERY      Family History  Problem Relation Age of Onset  . Coronary artery disease Brother     Social History:  reports that he has quit smoking. He has never used smokeless tobacco. He reports that he does not drink alcohol or use drugs.  Allergies:  Allergies  Allergen Reactions  . Anacin Af [Acetaminophen]   . Niaspan [Niacin Er]     Medications:  Scheduled: . atorvastatin  20 mg Oral q1800   . cefTRIAXone (ROCEPHIN)  IV  1 g Intravenous Q24H  . chlorhexidine gluconate (MEDLINE KIT)  15 mL Mouth Rinse BID  . Chlorhexidine Gluconate Cloth  6 each Topical Daily  . feeding supplement (PRO-STAT SUGAR FREE 64)  30 mL Per Tube QID  . fentaNYL (SUBLIMAZE) injection  50 mcg Intravenous Once  . hydrocortisone sod succinate (SOLU-CORTEF) inj  50 mg Intravenous Q6H  . levothyroxine  62.5 mcg Intravenous Daily  . mouth rinse  15 mL Mouth Rinse QID  . metoprolol  10 mg Intravenous Q6H  . pantoprazole (PROTONIX) IV  40 mg Intravenous Q24H    BMP Latest Ref Rng & Units 05/15/2016 05/14/2016 05/13/2016  Glucose 65 - 99 mg/dL 127(H) 67 98  BUN 6 - 20 mg/dL 46(H) 35(H) 24(H)  Creatinine 0.61 - 1.24 mg/dL 3.53(H) 2.63(H) 1.09  BUN/Creat Ratio 10 - 24 - - -  Sodium 135 - 145 mmol/L 142 144 146(H)  Potassium 3.5 - 5.1 mmol/L 5.2(H) 4.4 3.9  Chloride 101 - 111 mmol/L 108 109 107  CO2 22 - 32 mmol/L 18(L) 22 31  Calcium 8.9 - 10.3 mg/dL 8.0(L) 8.3(L) 8.5(L)   CBC Latest Ref Rng & Units 05/15/2016 05/15/2016 05/15/2016  WBC 4.0 - 10.5 K/uL 19.2(H) 19.5(H) 19.3(H)  Hemoglobin 13.0 - 17.0 g/dL 8.0(L) 7.5(L) 7.6(L)  Hematocrit 39.0 - 52.0 % 25.1(L) 23.5(L) 24.0(L)  Platelets 150 - 400 K/uL 140(L) 115(L) 123(L)     Dg Chest Port 1 View  Result Date: 05/15/2016 CLINICAL DATA:  Acute respiratory failure with hypoxia. EXAM: PORTABLE CHEST 1 VIEW COMPARISON:  05/14/2016. FINDINGS: Endotracheal  tube in satisfactory position. Nasogastric tube extending into the stomach. Left jugular catheter tip in the proximal superior vena cava. Stable enlarged cardiac silhouette, bibasilar airspace opacity and right pleural effusion. Thoracic spine degenerative changes. IMPRESSION: Stable cardiomegaly, bilateral pulmonary edema and right pleural effusion. Electronically Signed   By: Claudie Revering M.D.   On: 05/15/2016 07:33   Dg Chest Port 1 View  Result Date: 05/14/2016 CLINICAL DATA:  Acute respiratory failure with  hypoxia EXAM: PORTABLE CHEST 1 VIEW COMPARISON:  Chest radiograph from one day prior. FINDINGS: Endotracheal tube tip is 2.4 cm above the carina. Enteric tube enters stomach with the tip not seen on this image. Stable cardiomediastinal silhouette with mild cardiomegaly. No pneumothorax. Stable small right pleural effusion. Mild to moderate pulmonary edema appears slightly improved. Bibasilar lung opacities appear stable. Surgical clips overlie the medial lower right neck. IMPRESSION: 1. Well-positioned support structures.  No pneumothorax. 2. Stable small right pleural effusion. 3. Mild to moderate congestive heart failure, slightly improved. 4. Bibasilar lung opacities, stable, favor atelectasis. Electronically Signed   By: Ilona Sorrel M.D.   On: 05/14/2016 07:58    Review of Systems  Unable to perform ROS: Intubated   Blood pressure (!) 130/45, pulse (!) 101, temperature 98.2 F (36.8 C), resp. rate 16, height _0  (1.676 m), weight (!) 175.1 kg (386 lb), SpO2 94 %. Physical Exam  Nursing note and vitals reviewed. Constitutional: He appears well-developed.  Morbidly obese man  HENT:  Head: Normocephalic and atraumatic.  Intubated, sedated  Eyes: Pupils are equal, round, and reactive to light.  Neck: Neck supple. No JVD present.  Cardiovascular: Regular rhythm.  Exam reveals no friction rub.   Murmur heard. Regular tachycardia with ejection systolic murmur  Respiratory: Effort normal and breath sounds normal. He has no wheezes. He has no rales.  GI: Soft. Bowel sounds are normal. He exhibits distension.  Musculoskeletal: He exhibits edema.  2+ upper and lower extremity edema with 3+ dependent edema  Neurological:  Sedated  Skin: Skin is warm and dry. No erythema.    Assessment/Plan: 1. Acute kidney injury: Data appears to be most consistent with ATN arising from hemorrhagic shock. We'll check urinalysis and urine electrolytes. No evidence of hydronephrosis on recent CT scan and he  has an indwelling Foley catheter in place. Recommend initiation of CRRT for regulation of multiple metabolic abnormalities and to diminish the effects of uremia/dysfunctional platelets on propagating his retroperitoneal hemorrhage. His current pressor requirements will likely prevent volume unloading which remains a secondary priority at this time compared to clearance/regulation of metabolic abnormalities including worsening metabolic acidosis. 2. Hemorrhagic shock from acute retroperitoneal bleed: Continue pressor support as we are currently doing with PRBC transfusions as indicated. 3. Acute blood loss anemia secondary to retroperitoneal hemorrhage: Status post PRBC transfusion, continue to trend hemoglobin/hematocrit levels for indications of PRBCs. 4. Hyperkalemia: Mild and secondary to acute kidney injury, anticipated to improve with CRRT. 5. Elevated liver enzymes: Secondary to what appears to be most consistent with shock liver from hemorrhagic shock.  Aisha Greenberger K. 05/15/2016, 4:13 PM

## 2016-05-15 NOTE — Assessment & Plan Note (Addendum)
Presumed due to ATN, hemorrhagic shock. Starting to make some amber urine. On CVVHD with 100cc/h removed.

## 2016-05-15 NOTE — Consult Note (Signed)
[facility] Surgery Consult/Admission Note  Omar [name] 24-Nov-1945  [id number].    Requesting MD: Dr. Chase Caller Chief Complaint/Reason for Consult: Retroperitoneal bleed  HPI:   Pt is a 71 year old male with a history of A. Fib, HTN, morbid obesity, TIA with right sided hemiparesis, thrombocytopenia, asthma, gout who presented to the [facility] ED on 05/15/2016 with AMS. Pt told EMS he was "feeling weak". EMS stated pt Sats were "70% R/A" on their arrival to scene. Pt stated his "legs are more swollen than usual" and he has "had a cough." Pt was found to have altered mental status with aspiration, sepsis, hypoxic/hypercapnic respiratory failure.   Pt nonverbal so information provided in HPI was obtained from EMR.   ROS:  Review of Systems  Unable to perform ROS: Intubated     Family History  Problem Relation Age of Onset  . Coronary artery disease Brother     Past Medical History:  Diagnosis Date  . Asthma   . Atrial fibrillation ([place])   . Gout   . Hypertension   . Morbid obesity ([place])   . Multinodular goiter   . Osteoarthrosis and allied disorders   . Other and unspecified hyperlipidemia   . Stroke [facility])    right sided hemiparesis  . Thrombocytopenia ([place])   . Vitamin D deficiency     Past Surgical History:  Procedure Laterality Date  . THYROID SURGERY      Social History:  reports that he has quit smoking. He has never used smokeless tobacco. He reports that he does not drink alcohol or use drugs.  Allergies:  Allergies  Allergen Reactions  . Anacin Af [Acetaminophen]   . Niaspan [Niacin Er]     Medications Prior to Admission  Medication Sig Dispense Refill  . albuterol (PROVENTIL HFA;VENTOLIN HFA) 108 (90 Base) MCG/ACT inhaler Inhale 2 puffs into the lungs every 6 (six) hours as needed for wheezing or shortness of breath. 1 Inhaler 6  . allopurinol (ZYLOPRIM) 300 MG tablet TAKE 1 TABLET DAILY 30 tablet 2  . amLODipine (NORVASC) 5 MG tablet TAKE 1.5 TABLETS  (7.5 MG TOTAL) BY MOUTH DAILY. 135 tablet 1  . atorvastatin (LIPITOR) 20 MG tablet TAKE 1 TABLET (20 MG TOTAL) BY MOUTH DAILY AT 6 PM. 30 tablet 2  . fluticasone (FLONASE) 50 MCG/ACT nasal spray Place 1 spray into both nostrils 2 (two) times daily as needed for allergies or rhinitis. 16 g 6  . furosemide (LASIX) 20 MG tablet Take 1 tablet (20 mg total) by mouth daily. 30 tablet 3  . gabapentin (NEURONTIN) 300 MG capsule TAKE 1 CAPSULE AT 8 AM 1 AT 6 PM AND 2 AT BEDTIME 120 capsule 2  . KLOR-CON M20 20 MEQ tablet TAKE 1 TABLET BY MOUTH EVERY DAY 30 tablet 2  . levothyroxine (SYNTHROID, LEVOTHROID) 125 MCG tablet TAKE 1 TABLET EVERY DAY 30 tablet 6  . losartan (COZAAR) 100 MG tablet TAKE 1 TABLET (100 MG TOTAL) BY MOUTH DAILY. 90 tablet 0  . metoprolol (LOPRESSOR) 50 MG tablet TAKE 1/2 TABLET 2 TIMES A DAY 90 tablet 0  . Vitamin D, Ergocalciferol, (DRISDOL) 50000 units CAPS capsule Take 1 capsule (50,000 Units total) by mouth every 7 (seven) days. 12 capsule 3  . warfarin (COUMADIN) 7.5 MG tablet TAKE 1 TABLET DAILY AT 6PM 30 tablet 2    Blood pressure (!) 124/56, pulse (!) 102, temperature 98.1 F (36.7 C), resp. rate 16, height _0  (1.676 m), weight (!) 386 lb (  175.1 kg), SpO2 94 %.  Physical Exam  Constitutional: No distress. He is intubated.  Obese AA male, intubated and unresponsive  HENT:  Head: Normocephalic and atraumatic.  Nose: Nose normal.  Cardiovascular: S1 normal and S2 normal.  Tachycardia present.  Exam reveals no gallop and no friction rub.   Murmur heard.  Systolic murmur is present  Pulses:      Dorsalis pedis pulses are 2+ on the right side, and 2+ on the left side.  Pulmonary/Chest: He is intubated. He has no wheezes. He has no rhonchi. He has no rales.  Abdominal: Soft. Bowel sounds are hypoactive.  Obese abdomen difficult to assess distention.   Musculoskeletal: He exhibits edema (mild to RUE, 1+ pitting to RLE). He exhibits no deformity.  Neurological:   Unresponsive, intubated  Skin: Skin is warm and dry. He is not diaphoretic.  Nursing note and vitals reviewed.   Results for orders placed or performed during the hospital encounter of 05/14/2016 (from the past 48 hour(s))  Prepare RBC     Status: None   Collection Time: 05/13/16  4:49 PM  Result Value Ref Range   Order Confirmation ORDER PROCESSED BY BLOOD BANK   Protime-INR     Status: Abnormal   Collection Time: 05/13/16  4:59 PM  Result Value Ref Range   Prothrombin Time 32.8 (H) 11.4 - 15.2 seconds   INR 3.12   APTT     Status: Abnormal   Collection Time: 05/13/16  4:59 PM  Result Value Ref Range   aPTT 51 (H) 24 - 36 seconds    Comment:        IF BASELINE aPTT IS ELEVATED, SUGGEST PATIENT RISK ASSESSMENT BE USED TO DETERMINE APPROPRIATE ANTICOAGULANT THERAPY.   CBC with Differential/Platelet     Status: Abnormal   Collection Time: 05/13/16  5:30 PM  Result Value Ref Range   WBC 9.7 4.0 - 10.5 K/uL   RBC 2.49 (L) 4.22 - 5.81 MIL/uL   Hemoglobin 7.5 (L) 13.0 - 17.0 g/dL   HCT 24.8 (L) 39.0 - 52.0 %   MCV 99.6 78.0 - 100.0 fL    Comment: REPEATED TO VERIFY POST TRANSFUSION SPECIMEN DELTA CHECK NOTED    MCH 30.1 26.0 - 34.0 pg   MCHC 30.2 30.0 - 36.0 g/dL   RDW 19.6 (H) 11.5 - 15.5 %   Platelets 114 (L) 150 - 400 K/uL    Comment: REPEATED TO VERIFY SPECIMEN CHECKED FOR CLOTS PLATELET COUNT CONFIRMED BY SMEAR    Neutrophils Relative % 71 %   Lymphocytes Relative 11 %   Monocytes Relative 18 %   Eosinophils Relative 0 %   Basophils Relative 0 %   Neutro Abs 6.9 1.7 - 7.7 K/uL   Lymphs Abs 1.1 0.7 - 4.0 K/uL   Monocytes Absolute 1.7 (H) 0.1 - 1.0 K/uL   Eosinophils Absolute 0.0 0.0 - 0.7 K/uL   Basophils Absolute 0.0 0.0 - 0.1 K/uL   RBC Morphology RARE NRBCs     Comment: BASOPHILIC STIPPLING ROULEAUX MARKED POLYCHROMASIA    WBC Morphology VACUOLATED NEUTROPHILS     Comment: MILD LEFT SHIFT (1-5% METAS, OCC MYELO, OCC BANDS)  Prepare fresh frozen plasma      Status: None   Collection Time: 05/13/16  8:44 PM  Result Value Ref Range   Unit Number [id number]    Blood Component Type THAWED PLASMA    Unit division 00    Status of Unit ISSUED,FINAL    Transfusion Status  OK TO TRANSFUSE    Unit Number [id number]    Blood Component Type THWPLS APHR2    Unit division 00    Status of Unit ISSUED,FINAL    Transfusion Status OK TO TRANSFUSE    Unit Number [id number]    Blood Component Type THWPLS APHR1    Unit division 00    Status of Unit ISSUED,FINAL    Transfusion Status OK TO TRANSFUSE    Unit Number [id number]    Blood Component Type THW PLS APHR    Unit division 00    Status of Unit ISSUED,FINAL    Transfusion Status OK TO TRANSFUSE   Blood gas, arterial     Status: Abnormal   Collection Time: 05/13/16  8:50 PM  Result Value Ref Range   FIO2 60.00    Delivery systems VENTILATOR    Mode PRESSURE REGULATED VOLUME CONTROL    VT 540 mL   LHR 16 resp/min   Peep/cpap 5.0 cm H20   pH, Arterial 7.243 (L) 7.350 - 7.450   pCO2 arterial 56.5 (H) 32.0 - 48.0 mmHg   pO2, Arterial 78.0 (L) 83.0 - 108.0 mmHg   Bicarbonate 23.2 20.0 - 28.0 mmol/L   Acid-base deficit 2.9 (H) 0.0 - 2.0 mmol/L   O2 Saturation 92.8 %   Patient temperature 100.4    Collection site LEFT RADIAL    Drawn by [id number]    Sample type ARTERIAL DRAW    Allens test (pass/fail) PASS PASS  Protime-INR     Status: Abnormal   Collection Time: 05/14/16  4:39 AM  Result Value Ref Range   Prothrombin Time 30.2 (H) 11.4 - 15.2 seconds   INR 2.81   CBC     Status: Abnormal   Collection Time: 05/14/16  4:39 AM  Result Value Ref Range   WBC 13.0 (H) 4.0 - 10.5 K/uL   RBC 2.32 (L) 4.22 - 5.81 MIL/uL   Hemoglobin 7.1 (L) 13.0 - 17.0 g/dL   HCT 23.3 (L) 39.0 - 52.0 %   MCV 100.4 (H) 78.0 - 100.0 fL   MCH 30.6 26.0 - 34.0 pg   MCHC 30.5 30.0 - 36.0 g/dL   RDW 20.2 (H) 11.5 - 15.5 %   Platelets 120 (L) 150 - 400 K/uL  Basic metabolic panel     Status: Abnormal    Collection Time: 05/14/16  4:39 AM  Result Value Ref Range   Sodium 144 135 - 145 mmol/L   Potassium 4.4 3.5 - 5.1 mmol/L   Chloride 109 101 - 111 mmol/L   CO2 22 22 - 32 mmol/L   Glucose, Bld 67 65 - 99 mg/dL   BUN 35 (H) 6 - 20 mg/dL   Creatinine, Ser 2.63 (H) 0.61 - 1.24 mg/dL    Comment: DELTA CHECK NOTED   Calcium 8.3 (L) 8.9 - 10.3 mg/dL   GFR calc non Af Amer 23 (L) >60 mL/min   GFR calc Af Amer 26 (L) >60 mL/min    Comment: (NOTE) The eGFR has been calculated using the CKD EPI equation. This calculation has not been validated in all clinical situations. eGFR's persistently <60 mL/min signify possible Chronic Kidney Disease.    Anion gap 13 5 - 15  Magnesium     Status: None   Collection Time: 05/14/16  4:39 AM  Result Value Ref Range   Magnesium 1.8 1.7 - 2.4 mg/dL  Phosphorus     Status: Abnormal   Collection Time: 05/14/16  4:39 AM  Result Value Ref Range   Phosphorus 5.2 (H) 2.5 - 4.6 mg/dL  APTT     Status: Abnormal   Collection Time: 05/14/16  4:39 AM  Result Value Ref Range   aPTT 43 (H) 24 - 36 seconds    Comment:        IF BASELINE aPTT IS ELEVATED, SUGGEST PATIENT RISK ASSESSMENT BE USED TO DETERMINE APPROPRIATE ANTICOAGULANT THERAPY.   Lactic acid, plasma     Status: Abnormal   Collection Time: 05/14/16  6:07 AM  Result Value Ref Range   Lactic Acid, Venous 8.3 (HH) 0.5 - 1.9 mmol/L    Comment: CRITICAL RESULT CALLED TO, READ BACK BY AND VERIFIED WITH: L.SATTERFIELD RN @ [phone] 05/14/16 BY C.EDENS   I-STAT 3, arterial blood gas (G3+)     Status: Abnormal   Collection Time: 05/14/16  7:27 AM  Result Value Ref Range   pH, Arterial 7.288 (L) 7.350 - 7.450   pCO2 arterial 41.1 32.0 - 48.0 mmHg   pO2, Arterial 72.0 (L) 83.0 - 108.0 mmHg   Bicarbonate 19.7 (L) 20.0 - 28.0 mmol/L   TCO2 21 0 - 100 mmol/L   O2 Saturation 92.0 %   Acid-base deficit 6.0 (H) 0.0 - 2.0 mmol/L   Patient temperature HIDE    Collection site ARTERIAL LINE    Sample type  ARTERIAL   Prepare RBC     Status: None   Collection Time: 05/14/16 10:26 AM  Result Value Ref Range   Order Confirmation ORDER PROCESSED BY BLOOD BANK   I-STAT 3, arterial blood gas (G3+)     Status: Abnormal   Collection Time: 05/14/16 10:44 AM  Result Value Ref Range   pH, Arterial 7.252 (L) 7.350 - 7.450   pCO2 arterial 44.1 32.0 - 48.0 mmHg   pO2, Arterial 84.0 83.0 - 108.0 mmHg   Bicarbonate 19.3 (L) 20.0 - 28.0 mmol/L   TCO2 21 0 - 100 mmol/L   O2 Saturation 94.0 %   Acid-base deficit 7.0 (H) 0.0 - 2.0 mmol/L   Patient temperature 99.6 F    Collection site RADIAL, ALLEN'S TEST ACCEPTABLE    Drawn by VP    Sample type ARTERIAL   Magnesium     Status: Abnormal   Collection Time: 05/14/16 12:57 PM  Result Value Ref Range   Magnesium 1.6 (L) 1.7 - 2.4 mg/dL  Phosphorus     Status: Abnormal   Collection Time: 05/14/16 12:57 PM  Result Value Ref Range   Phosphorus 5.9 (H) 2.5 - 4.6 mg/dL  CBC     Status: Abnormal   Collection Time: 05/14/16  5:11 PM  Result Value Ref Range   WBC 16.6 (H) 4.0 - 10.5 K/uL   RBC 2.24 (L) 4.22 - 5.81 MIL/uL   Hemoglobin 6.8 (LL) 13.0 - 17.0 g/dL    Comment: REPEATED TO VERIFY CRITICAL RESULT CALLED TO, READ BACK BY AND VERIFIED WITH: D PARKS,RN 05/14/16 1812 RHOLMES    HCT 21.9 (L) 39.0 - 52.0 %   MCV 97.8 78.0 - 100.0 fL   MCH 30.4 26.0 - 34.0 pg   MCHC 31.1 30.0 - 36.0 g/dL   RDW 20.2 (H) 11.5 - 15.5 %   Platelets 110 (L) 150 - 400 K/uL    Comment: REPEATED TO VERIFY PLATELET COUNT CONFIRMED BY SMEAR   Magnesium     Status: None   Collection Time: 05/14/16  5:11 PM  Result Value Ref Range   Magnesium 1.7 1.7 - 2.4 mg/dL  Phosphorus     Status: Abnormal   Collection Time: 05/14/16  5:11 PM  Result Value Ref Range   Phosphorus 6.1 (H) 2.5 - 4.6 mg/dL  Prepare RBC     Status: None   Collection Time: 05/14/16  7:00 PM  Result Value Ref Range   Order Confirmation BB SAMPLE OR UNITS ALREADY AVAILABLE   Glucose, capillary     Status:  Abnormal   Collection Time: 05/14/16  8:56 PM  Result Value Ref Range   Glucose-Capillary 48 (L) 65 - 99 mg/dL   Comment 1 Notify RN   Glucose, capillary     Status: None   Collection Time: 05/14/16  9:17 PM  Result Value Ref Range   Glucose-Capillary 65 65 - 99 mg/dL   Comment 1 Notify RN   Glucose, capillary     Status: Abnormal   Collection Time: 05/14/16  9:51 PM  Result Value Ref Range   Glucose-Capillary 150 (H) 65 - 99 mg/dL   Comment 1 Notify RN   Glucose, capillary     Status: Abnormal   Collection Time: 05/14/16 11:44 PM  Result Value Ref Range   Glucose-Capillary 108 (H) 65 - 99 mg/dL   Comment 1 Notify RN   CBC with Differential/Platelet     Status: Abnormal   Collection Time: 05/15/16  3:21 AM  Result Value Ref Range   WBC 19.4 (H) 4.0 - 10.5 K/uL   RBC 2.47 (L) 4.22 - 5.81 MIL/uL   Hemoglobin 7.5 (L) 13.0 - 17.0 g/dL   HCT 23.6 (L) 39.0 - 52.0 %   MCV 95.5 78.0 - 100.0 fL   MCH 30.4 26.0 - 34.0 pg   MCHC 31.8 30.0 - 36.0 g/dL   RDW 20.0 (H) 11.5 - 15.5 %   Platelets 107 (L) 150 - 400 K/uL    Comment: CONSISTENT WITH PREVIOUS RESULT   Neutrophils Relative % 85 %   Lymphocytes Relative 6 %   Monocytes Relative 9 %   Eosinophils Relative 0 %   Basophils Relative 0 %   Neutro Abs 16.5 (H) 1.7 - 7.7 K/uL   Lymphs Abs 1.2 0.7 - 4.0 K/uL   Monocytes Absolute 1.7 (H) 0.1 - 1.0 K/uL   Eosinophils Absolute 0.0 0.0 - 0.7 K/uL   Basophils Absolute 0.0 0.0 - 0.1 K/uL   RBC Morphology RARE NRBCs    WBC Morphology MILD LEFT SHIFT (1-5% METAS, OCC MYELO, OCC BANDS)     Comment: VACUOLATED NEUTROPHILS  I-STAT 3, arterial blood gas (G3+)     Status: Abnormal   Collection Time: 05/15/16  3:33 AM  Result Value Ref Range   pH, Arterial 7.283 (L) 7.350 - 7.450   pCO2 arterial 40.6 32.0 - 48.0 mmHg   pO2, Arterial 72.0 (L) 83.0 - 108.0 mmHg   Bicarbonate 19.3 (L) 20.0 - 28.0 mmol/L   TCO2 21 0 - 100 mmol/L   O2 Saturation 93.0 %   Acid-base deficit 7.0 (H) 0.0 - 2.0  mmol/L   Patient temperature 36.5 C    Collection site ARTERIAL LINE    Drawn by Operator    Sample type ARTERIAL   Glucose, capillary     Status: Abnormal   Collection Time: 05/15/16  3:34 AM  Result Value Ref Range   Glucose-Capillary 129 (H) 65 - 99 mg/dL   Comment 1 Document in Chart   Protime-INR     Status: Abnormal   Collection Time: 05/15/16  3:40 AM  Result Value Ref  Range   Prothrombin Time 39.5 (H) 11.4 - 15.2 seconds   INR 3.93   Comprehensive metabolic panel     Status: Abnormal   Collection Time: 05/15/16  3:40 AM  Result Value Ref Range   Sodium 142 135 - 145 mmol/L   Potassium 5.2 (H) 3.5 - 5.1 mmol/L   Chloride 108 101 - 111 mmol/L   CO2 18 (L) 22 - 32 mmol/L   Glucose, Bld 127 (H) 65 - 99 mg/dL   BUN 46 (H) 6 - 20 mg/dL   Creatinine, Ser 3.53 (H) 0.61 - 1.24 mg/dL   Calcium 8.0 (L) 8.9 - 10.3 mg/dL   Total Protein 6.9 6.5 - 8.1 g/dL   Albumin 2.9 (L) 3.5 - 5.0 g/dL   AST 3,667 (H) 15 - 41 U/L    Comment: RESULTS CONFIRMED BY MANUAL DILUTION   ALT 2,019 (H) 17 - 63 U/L   Alkaline Phosphatase 104 38 - 126 U/L   Total Bilirubin 5.0 (H) 0.3 - 1.2 mg/dL   GFR calc non Af Amer 16 (L) >60 mL/min   GFR calc Af Amer 18 (L) >60 mL/min    Comment: (NOTE) The eGFR has been calculated using the CKD EPI equation. This calculation has not been validated in all clinical situations. eGFR's persistently <60 mL/min signify possible Chronic Kidney Disease.    Anion gap 16 (H) 5 - 15  Magnesium     Status: None   Collection Time: 05/15/16  3:40 AM  Result Value Ref Range   Magnesium 2.0 1.7 - 2.4 mg/dL  Phosphorus     Status: Abnormal   Collection Time: 05/15/16  3:40 AM  Result Value Ref Range   Phosphorus 6.1 (H) 2.5 - 4.6 mg/dL  Glucose, capillary     Status: Abnormal   Collection Time: 05/15/16  7:36 AM  Result Value Ref Range   Glucose-Capillary 110 (H) 65 - 99 mg/dL   Comment 1 Notify RN    Comment 2 Document in Chart   CBC     Status: Abnormal   Collection  Time: 05/15/16  7:45 AM  Result Value Ref Range   WBC 19.3 (H) 4.0 - 10.5 K/uL    Comment: WHITE COUNT CONFIRMED ON SMEAR   RBC 2.52 (L) 4.22 - 5.81 MIL/uL   Hemoglobin 7.6 (L) 13.0 - 17.0 g/dL   HCT 24.0 (L) 39.0 - 52.0 %   MCV 95.2 78.0 - 100.0 fL   MCH 30.2 26.0 - 34.0 pg   MCHC 31.7 30.0 - 36.0 g/dL   RDW 19.9 (H) 11.5 - 15.5 %   Platelets 123 (L) 150 - 400 K/uL  CBC     Status: Abnormal   Collection Time: 05/15/16 10:16 AM  Result Value Ref Range   WBC 19.5 (H) 4.0 - 10.5 K/uL   RBC 2.49 (L) 4.22 - 5.81 MIL/uL   Hemoglobin 7.5 (L) 13.0 - 17.0 g/dL   HCT 23.5 (L) 39.0 - 52.0 %   MCV 94.4 78.0 - 100.0 fL   MCH 30.1 26.0 - 34.0 pg   MCHC 31.9 30.0 - 36.0 g/dL   RDW 19.4 (H) 11.5 - 15.5 %   Platelets 115 (L) 150 - 400 K/uL    Comment: REPEATED TO VERIFY PLATELET COUNT CONFIRMED BY SMEAR   Lactic acid, plasma     Status: Abnormal   Collection Time: 05/15/16 10:30 AM  Result Value Ref Range   Lactic Acid, Venous 4.6 (HH) 0.5 - 1.9 mmol/L  Comment: CRITICAL RESULT CALLED TO, READ BACK BY AND VERIFIED WITH: LINDSAY FLOOD,RN AT 1203 05/15/16 BY ZBEECH.   Troponin I     Status: Abnormal   Collection Time: 05/15/16 10:30 AM  Result Value Ref Range   Troponin I 0.83 (HH) <0.03 ng/mL    Comment: CRITICAL RESULT CALLED TO, READ BACK BY AND VERIFIED WITH: LAUREN ROBBINS,RN AT 1410 05/15/16 BY ZBEECH.   Prepare Pheresed Platelets     Status: None (Preliminary result)   Collection Time: 05/15/16 10:31 AM  Result Value Ref Range   Unit Number [id number]    Blood Component Type PLTP LR3 PAS    Unit division 00    Status of Unit REL FROM [facility]    Transfusion Status OK TO TRANSFUSE    Unit Number [id number]    Blood Component Type PLTPHER LR2    Unit division 00    Status of Unit ISSUED    Transfusion Status OK TO TRANSFUSE   Prepare RBC     Status: None   Collection Time: 05/15/16 10:32 AM  Result Value Ref Range   Order Confirmation ORDER PROCESSED BY BLOOD BANK    Glucose, capillary     Status: Abnormal   Collection Time: 05/15/16 12:17 PM  Result Value Ref Range   Glucose-Capillary 177 (H) 65 - 99 mg/dL   Comment 1 Notify RN    Comment 2 Document in Chart    Dg Chest Port 1 View  Result Date: 05/15/2016 CLINICAL DATA:  Acute respiratory failure with hypoxia. EXAM: PORTABLE CHEST 1 VIEW COMPARISON:  05/14/2016. FINDINGS: Endotracheal tube in satisfactory position. Nasogastric tube extending into the stomach. Left jugular catheter tip in the proximal superior vena cava. Stable enlarged cardiac silhouette, bibasilar airspace opacity and right pleural effusion. Thoracic spine degenerative changes. IMPRESSION: Stable cardiomegaly, bilateral pulmonary edema and right pleural effusion. Electronically Signed   By: Claudie Revering M.D.   On: 05/15/2016 07:33   Dg Chest Port 1 View  Result Date: 05/14/2016 CLINICAL DATA:  Acute respiratory failure with hypoxia EXAM: PORTABLE CHEST 1 VIEW COMPARISON:  Chest radiograph from one day prior. FINDINGS: Endotracheal tube tip is 2.4 cm above the carina. Enteric tube enters stomach with the tip not seen on this image. Stable cardiomediastinal silhouette with mild cardiomegaly. No pneumothorax. Stable small right pleural effusion. Mild to moderate pulmonary edema appears slightly improved. Bibasilar lung opacities appear stable. Surgical clips overlie the medial lower right neck. IMPRESSION: 1. Well-positioned support structures.  No pneumothorax. 2. Stable small right pleural effusion. 3. Mild to moderate congestive heart failure, slightly improved. 4. Bibasilar lung opacities, stable, favor atelectasis. Electronically Signed   By: Ilona Sorrel M.D.   On: 05/14/2016 07:58      Assessment/Plan  retroperitoneal bleed - INR 3.93 - Surgery is not indicated for this. - Any surgery on this type of bleed will likely result in more bleeding.   Thank you for the consult. We will signoff at this time. If you have any questions or  concern please do not hesitate to page [place].  Kalman Drape, [facility] Surgery 05/15/2016, 3:22 PM Pager: [phone] Consults: [phone] Mon-Fri 7:00 am-4:30 pm Sat-Sun 7:00 am-11:30 am

## 2016-05-15 NOTE — Assessment & Plan Note (Addendum)
Large RP bleed noted, slightly larger on serial Ct scan 3/18 but appears to be stabilizing based on CBC. Note pt has apparent hypersensitivity to heparin - avoid. Follow CBC

## 2016-05-15 NOTE — Progress Notes (Signed)
  Interdisciplinary Goals of Care Family Meeting   Date carried out:: 05/15/2016  Location of the meeting: Bedside conference room  Member's involved: Physician, Bedside Registered Nurse, Family Member or next of kin and Other: grandaughter DPOA, her boyfriend, and a cousin  Durable Power of Pensions consultantAttorney or acting medical decision maker: grandaughter   Omar MaesQuaneishia [name]  Discussion: We discussed goals of care for American Family InsuranceWilliam R [name] .  Lot of anger from family about current RP hemorrhage and refractory shock. Explained prognosis is in order of days at the most. And will be very surprised if he would be alive past next few days. They have been very upset because RP bleed happened during Iv heparin switch . State in 2013 patient got heparin and bled. And coumadin is safer for patient. They have taken legal opinion. They are asking about transfer and explanation for etiology of bleed as well  They are also upset at what they feel is stonewalling communication and lack of specific details  Code status: full code  Disposition: Continue current acute care  1. Unqiue heparin sensitivity v general risk -> Dr Cyndie ChimeGranfortuna heme consult 2. ATN - renal callled 3. RP bleed - doubt surgical - [facility] called  Time spent for the meeting: 30 additional minutes  Fionn Stracke 05/15/2016, 2:50 PM

## 2016-05-16 ENCOUNTER — Inpatient Hospital Stay (HOSPITAL_COMMUNITY): Payer: Medicare Other

## 2016-05-16 DIAGNOSIS — J9600 Acute respiratory failure, unspecified whether with hypoxia or hypercapnia: Secondary | ICD-10-CM

## 2016-05-16 LAB — RENAL FUNCTION PANEL
ALBUMIN: 3 g/dL — AB (ref 3.5–5.0)
ANION GAP: 7 (ref 5–15)
ANION GAP: 10 (ref 5–15)
Albumin: 3.1 g/dL — ABNORMAL LOW (ref 3.5–5.0)
BUN: 45 mg/dL — AB (ref 6–20)
BUN: 36 mg/dL — ABNORMAL HIGH (ref 6–20)
CHLORIDE: 106 mmol/L (ref 101–111)
CHLORIDE: 102 mmol/L (ref 101–111)
CO2: 29 mmol/L (ref 22–32)
CO2: 27 mmol/L (ref 22–32)
Calcium: 7.9 mg/dL — ABNORMAL LOW (ref 8.9–10.3)
Calcium: 8 mg/dL — ABNORMAL LOW (ref 8.9–10.3)
Creatinine, Ser: 3.03 mg/dL — ABNORMAL HIGH (ref 0.61–1.24)
Creatinine, Ser: 2.33 mg/dL — ABNORMAL HIGH (ref 0.61–1.24)
GFR calc Af Amer: 30 mL/min — ABNORMAL LOW (ref >60)
GFR, EST AFRICAN AMERICAN: 22 mL/min — AB (ref >60)
GFR, EST NON AFRICAN AMERICAN: 19 mL/min — AB (ref >60)
GFR, EST NON AFRICAN AMERICAN: 26 mL/min — AB (ref >60)
Glucose, Bld: 179 mg/dL — ABNORMAL HIGH (ref 65–99)
Glucose, Bld: 136 mg/dL — ABNORMAL HIGH (ref 65–99)
PHOSPHORUS: 2.7 mg/dL (ref 2.5–4.6)
POTASSIUM: 4.4 mmol/L (ref 3.5–5.1)
POTASSIUM: 4.3 mmol/L (ref 3.5–5.1)
Phosphorus: 3.7 mg/dL (ref 2.5–4.6)
Sodium: 142 mmol/L (ref 135–145)
Sodium: 139 mmol/L (ref 135–145)

## 2016-05-16 LAB — BPAM FFP
BLOOD PRODUCT EXPIRATION DATE: 201803172359
BLOOD PRODUCT EXPIRATION DATE: 201803172359
Blood Product Expiration Date: 201803172359
Blood Product Expiration Date: 201803172359
Blood Product Expiration Date: 201803172359
ISSUE DATE / TIME: 201803121610
ISSUE DATE / TIME: 201803121710
ISSUE DATE / TIME: 201803122143
ISSUE DATE / TIME: 201803122143
ISSUE DATE / TIME: 201803122143
UNIT TYPE AND RH: 7300
UNIT TYPE AND RH: 7300
Unit Type and Rh: 7300
Unit Type and Rh: 7300
Unit Type and Rh: 7300

## 2016-05-16 LAB — PREPARE FRESH FROZEN PLASMA
UNIT DIVISION: 0
UNIT DIVISION: 0
Unit division: 0
Unit division: 0
Unit division: 0

## 2016-05-16 LAB — CBC WITH DIFFERENTIAL/PLATELET
BASOS ABS: 0 10*3/uL (ref 0.0–0.1)
BASOS PCT: 0 %
EOS PCT: 0 %
Eosinophils Absolute: 0 10*3/uL (ref 0.0–0.7)
HCT: 21.4 % — ABNORMAL LOW (ref 39.0–52.0)
Hemoglobin: 7 g/dL — ABNORMAL LOW (ref 13.0–17.0)
LYMPHS PCT: 3 %
Lymphs Abs: 0.4 10*3/uL — ABNORMAL LOW (ref 0.7–4.0)
MCH: 30.5 pg (ref 26.0–34.0)
MCHC: 32.2 g/dL (ref 30.0–36.0)
MCV: 94.7 fL (ref 78.0–100.0)
Monocytes Absolute: 0.8 10*3/uL (ref 0.1–1.0)
Monocytes Relative: 6 %
NEUTROS ABS: 11.4 10*3/uL — AB (ref 1.7–7.7)
Neutrophils Relative %: 91 %
Platelets: 97 10*3/uL — ABNORMAL LOW (ref 150–400)
RBC: 2.26 MIL/uL — AB (ref 4.22–5.81)
RDW: 18.9 % — AB (ref 11.5–15.5)
WBC: 12.6 10*3/uL — AB (ref 4.0–10.5)

## 2016-05-16 LAB — HEPATIC FUNCTION PANEL
ALBUMIN: 3.2 g/dL — AB (ref 3.5–5.0)
ALT: 1414 U/L — AB (ref 17–63)
AST: 1884 U/L — AB (ref 15–41)
Alkaline Phosphatase: 118 U/L (ref 38–126)
Bilirubin, Direct: 3 mg/dL — ABNORMAL HIGH (ref 0.1–0.5)
Indirect Bilirubin: 2.5 mg/dL — ABNORMAL HIGH (ref 0.3–0.9)
TOTAL PROTEIN: 7.4 g/dL (ref 6.5–8.1)
Total Bilirubin: 5.5 mg/dL — ABNORMAL HIGH (ref 0.3–1.2)

## 2016-05-16 LAB — BLOOD GAS, ARTERIAL
ACID-BASE EXCESS: 3 mmol/L — AB (ref 0.0–2.0)
Acid-Base Excess: 2.5 mmol/L — ABNORMAL HIGH (ref 0.0–2.0)
Bicarbonate: 28.5 mmol/L — ABNORMAL HIGH (ref 20.0–28.0)
Bicarbonate: 27.8 mmol/L (ref 20.0–28.0)
DRAWN BY: 398661
DRAWN BY: 406621
FIO2: 50
FIO2: 50
MECHVT: 540 mL
O2 Saturation: 92.8 %
O2 Saturation: 94.9 %
PEEP: 5 cmH2O
PEEP: 5 cmH2O
PH ART: 7.292 — AB (ref 7.350–7.450)
Patient temperature: 98.6
Patient temperature: 98.6
RATE: 16 resp/min
RATE: 20 resp/min
VT: 540 mL
pCO2 arterial: 60.8 mmHg — ABNORMAL HIGH (ref 32.0–48.0)
pCO2 arterial: 49 mmHg — ABNORMAL HIGH (ref 32.0–48.0)
pH, Arterial: 7.373 (ref 7.350–7.450)
pO2, Arterial: 71.7 mmHg — ABNORMAL LOW (ref 83.0–108.0)
pO2, Arterial: 72.2 mmHg — ABNORMAL LOW (ref 83.0–108.0)

## 2016-05-16 LAB — CBC
HCT: 22.1 % — ABNORMAL LOW (ref 39.0–52.0)
HEMATOCRIT: 23 % — AB (ref 39.0–52.0)
Hemoglobin: 7.2 g/dL — ABNORMAL LOW (ref 13.0–17.0)
Hemoglobin: 7.5 g/dL — ABNORMAL LOW (ref 13.0–17.0)
MCH: 30.1 pg (ref 26.0–34.0)
MCH: 30.1 pg (ref 26.0–34.0)
MCHC: 32.6 g/dL (ref 30.0–36.0)
MCHC: 32.6 g/dL (ref 30.0–36.0)
MCV: 92.5 fL (ref 78.0–100.0)
MCV: 92.4 fL (ref 78.0–100.0)
PLATELETS: 115 10*3/uL — AB (ref 150–400)
Platelets: 113 10*3/uL — ABNORMAL LOW (ref 150–400)
RBC: 2.39 MIL/uL — AB (ref 4.22–5.81)
RBC: 2.49 MIL/uL — ABNORMAL LOW (ref 4.22–5.81)
RDW: 17.8 % — ABNORMAL HIGH (ref 11.5–15.5)
RDW: 18 % — AB (ref 11.5–15.5)
WBC: 11.6 10*3/uL — AB (ref 4.0–10.5)
WBC: 12.8 10*3/uL — AB (ref 4.0–10.5)

## 2016-05-16 LAB — PREPARE RBC (CROSSMATCH)

## 2016-05-16 LAB — GLUCOSE, CAPILLARY
GLUCOSE-CAPILLARY: 75 mg/dL (ref 65–99)
GLUCOSE-CAPILLARY: 158 mg/dL — AB (ref 65–99)
GLUCOSE-CAPILLARY: 114 mg/dL — AB (ref 65–99)
GLUCOSE-CAPILLARY: 122 mg/dL — AB (ref 65–99)
Glucose-Capillary: 136 mg/dL — ABNORMAL HIGH (ref 65–99)
Glucose-Capillary: 137 mg/dL — ABNORMAL HIGH (ref 65–99)

## 2016-05-16 LAB — BPAM PLATELET PHERESIS
Blood Product Expiration Date: 201803122359
Blood Product Expiration Date: 201803132359
ISSUE DATE / TIME: 201803121238
ISSUE DATE / TIME: 201803121137
Unit Type and Rh: 6200
Unit Type and Rh: 6200

## 2016-05-16 LAB — PREPARE PLATELET PHERESIS
UNIT DIVISION: 0
UNIT DIVISION: 0

## 2016-05-16 LAB — PATHOLOGIST SMEAR REVIEW

## 2016-05-16 LAB — LACTIC ACID, PLASMA: LACTIC ACID, VENOUS: 2 mmol/L — AB (ref 0.5–1.9)

## 2016-05-16 LAB — MAGNESIUM: MAGNESIUM: 2 mg/dL (ref 1.7–2.4)

## 2016-05-16 LAB — TROPONIN I: TROPONIN I: 0.48 ng/mL — AB (ref <0.03)

## 2016-05-16 LAB — PROTIME-INR
INR: 2.21
PROTHROMBIN TIME: 24.9 s — AB (ref 11.4–15.2)

## 2016-05-16 LAB — UREA NITROGEN, URINE: UREA NITROGEN UR: 102 mg/dL

## 2016-05-16 MED ORDER — SODIUM CHLORIDE 0.9 % IV SOLN
Freq: Once | INTRAVENOUS | Status: AC
  Administered 2016-05-16 (×2): via INTRAVENOUS

## 2016-05-16 MED ORDER — VITAMIN K1 10 MG/ML IJ SOLN
10.0000 mg | Freq: Once | INTRAMUSCULAR | Status: AC
  Administered 2016-05-16: 10 mg via INTRAVENOUS
  Filled 2016-05-16: qty 1

## 2016-05-16 MED ORDER — VITAL HIGH PROTEIN PO LIQD: 1000.0000 mL | ORAL | Status: DC

## 2016-05-16 MED ORDER — VITAL HIGH PROTEIN PO LIQD
1000.0000 mL | ORAL | Status: DC
  Administered 2016-05-16: 1000 mL

## 2016-05-16 MED ORDER — VITAL HIGH PROTEIN PO LIQD
1000.0000 mL | ORAL | Status: DC
  Administered 2016-05-16 – 2016-05-18 (×4): 1000 mL

## 2016-05-16 MED ORDER — HYDROCORTISONE NA SUCCINATE PF 100 MG IJ SOLR
50.0000 mg | Freq: Every day | INTRAMUSCULAR | Status: AC
  Administered 2016-05-17 – 2016-05-18 (×2): 50 mg via INTRAVENOUS
  Filled 2016-05-16 (×2): qty 1

## 2016-05-16 NOTE — Progress Notes (Signed)
Nutrition Consult / Follow-up  DOCUMENTATION CODES:   Morbid obesity  INTERVENTION:    Vital High Protein at 50 ml/h (1200 ml per day)  Pro-stat 30 ml QID  Provides 1600 kcal, 165 gm protein, 1204 ml free water daily  NUTRITION DIAGNOSIS:   Inadequate oral intake related to inability to eat as evidenced by NPO status.  Ongoing  GOAL:   Provide needs based on ASPEN/SCCM guidelines  Progressing  MONITOR:   Vent status, TF tolerance, I & O's, Labs  REASON FOR ASSESSMENT:   Consult Enteral/tube feeding initiation and management  ASSESSMENT:   71 y/o male from [facility] admitted 3/5 with altered mental status with aspiration, sepsis, hypoxic/hypercapnic respiratory failure.    Extubated 3/8 Re-intubated 3/10 TF was not started yesterday. Received MD Consult for TF initiation and management. Patient is currently intubated on ventilator support Temp (24hrs), Avg:96.9 F (36.1 C), Min:95.9 F (35.5 C), Max:98.2 F (36.8 C)  Propofol: none Labs reviewed. Medications reviewed.  Diet Order:  Diet NPO time specified  Skin:  Reviewed, no issues  Last BM:  3/11  Height:   Ht Readings from Last 1 Encounters:  05/09/16 5\' 6"  (1.676 m)    Weight:   Wt Readings from Last 1 Encounters:  05/16/16 (!) 376 lb 15.8 oz (171 kg)    Ideal Body Weight:  64.5 kg  BMI:  Body mass index is 60.85 kg/m.  Estimated Nutritional Needs:   Kcal:  [id number]  Protein:  161 gm  Fluid:  2-2.5 L  EDUCATION NEEDS:   No education needs identified at this time  Joaquin CourtsKimberly Emerald Shor, RD, LDN, CNSC Pager [phone] After Hours Pager [phone]

## 2016-05-16 NOTE — Progress Notes (Signed)
   05/16/16 1255  Clinical Encounter Type  Visited With Patient and family together  Visit Type Follow-up  Spiritual Encounters  Spiritual Needs Emotional  Stress Factors  Patient Stress Factors None identified  Family Stress Factors Family relationships  Introduction to family. Family coping. Offered further support if requested.

## 2016-05-16 NOTE — Telephone Encounter (Signed)
lmtcb jkp 3/13 

## 2016-05-16 NOTE — Progress Notes (Addendum)
PCCM Progress Note  Admission date: 05/20/2016 Referring provider: Dr. Clarene Duke, [facility]  CC: altered mental status  HPI: 71 y/o [name] from [facility] admitted 3/5 with altered mental status with aspiration, sepsis, hypoxic/hypercapnic respiratory failure.     has a past medical history of Asthma; Atrial fibrillation (HCC); Gout; Hypertension; Morbid obesity (HCC); Multinodular goiter; Osteoarthrosis and allied disorders; Other and unspecified hyperlipidemia; Stroke Omar [name]); Thrombocytopenia (HCC); and Vitamin D deficiency.    Antibiotics: Zosyn 3/06 >> 3/08 Rocephin (H.Flu PNA) 3/08 >>  Cultures: Urine 3/05 >> negative Blood 3/06 >>   Lines/tubes: ETT 3/05 >> 3/8 Lt IJ CVL 3/07 >>   Events: 3/05  Transfer to [facility] Sputum 3/06 >> Haemophilus influenzae (beta lactamase positive) Echo 3/06 >> EF 65 to 70%, mod LVH, mod TR, PAS 39 mmHg CT head 3/7 >> no acute abnormality   3/08  extubated off pressors. No longer meeting sirs criteria  3/10  Reintubated, concern for bleed with drop in Hgb on heparin gtt, resp distress, AF w RVR.   CT ABD/Pelvis 3/10 >> large right retroperitoneal hematoma, small bilateral pleural effusions with extensive consolidation/atelectasis 3/11  Received 4 FFP overnight, 2 units PRBC  3/11  Remains on high dose vasopressors, CT c/w retroperitoneal bleed  RN reports hypotension on levo + neo.  FFP infusing.   3/12 - max pressors. Shock liver. Deeply sedated. S/p 4 units prbc and 4 unit ffp as of last night -> got 1 unit prbc, platlelet   And FFP x 5 -> HD cath placed . Famiily very angry at outcome of RP hemorrhage. Reports he is ssensitive to heparin based on 2013 experience. CCS -> no ro9le for surgery. Heme consult ->  Done. CRRT started   SUBJECTIVE/OVERNIGHT/INTERVAL HX 3/13 - events from yesterday noted. Down on vasopressor need. Down to levophed 18, neo 50, and vasopresson 0.03. Ongoing CRRT. On 200 fentantyl mcg and RN reports responsiveness on left. Shock liver  better. Renal rec dc bic gtt  Vital signs: BP 105/70   Pulse 86   Temp (!) 96.3 F (35.7 C)   Resp (!) 0   Ht 5\' 6"  (1.676 m)   Wt (!) 171 kg (376 lb 15.8 oz)   SpO2 92%   BMI 60.85 kg/m   Intake/output:  Intake/Output Summary (Last 24 hours) at 05/16/16 0853 Last data filed at 05/16/16 0800  Gross per 24 hour  Intake           8796.1 ml  Output             3187 ml  Net           5609.1 ml    General Appearance:    Looks criticall ill OBESE - yes  Head:    Normocephalic, without obvious abnormality, atraumatic  Eyes:    PERRL - equal, conjunctiva/corneas - clear      Ears:    Normal external ear canals, both ears  Nose:   NG tube - no  Throat:  ETT TUBE - yes , OG tube - yes  Neck:   Supple,  No enlargement/tenderness/nodules     Lungs:     Clear to auscultation bilaterally, Ventilator   Synchrony - yes with RR set at 16  Chest wall:    No deformity  Heart:    S1 and S2 normal, no murmur, CVP - na.  Pressors - yes  Abdomen:     Soft, no masses, no organomegaly  Genitalia:    Not done  Rectal:   not done  Extremities:   Extremities- edema     Skin:   Intact in exposed areas . Sacral area - no decub per report     Neurologic:   Sedation - fentt gtt at -> RASS - -3 currently . Moves all 4s - baseline weak on right , has moved left and grabbed during [place] per RN.    PULMONARY  Recent Labs Lab 05/12/16 0342 05/13/16 1211  05/14/16 0727 05/14/16 1044 05/15/16 0333 05/15/16 1522 05/16/16 0515  PHART 7.253* 7.318*  < > 7.288* 7.252* 7.283* 7.317* 7.292*  PCO2ART 64.1* 53.8*  < > 41.1 44.1 40.6 44.0 60.8*  PO2ART 71.0* 89.0  < > 72.0* 84.0 72.0* 77.2* 71.7*  HCO3 28.0 27.6  < > 19.7* 19.3* 19.3* 21.9 28.5*  TCO2 30 29  --  21 21 21   --   --   O2SAT 89.0 96.0  < > 92.0 94.0 93.0 94.4 92.8  < > = values in this interval not displayed.  CBC  Recent Labs Lab 05/15/16 1523 05/15/16 2012 05/16/16 0359  HGB 8.0* 7.3* 7.0*  HCT 25.1* 23.1* 21.4*  WBC  19.2* 17.0* 12.6*  PLT 140* 122* 97*    COAGULATION  Recent Labs Lab 05/13/16 1659 05/14/16 0439 05/15/16 0340 05/15/16 2012 05/16/16 0359  INR 3.12 2.81 3.93 2.75 2.21    CARDIAC   Recent Labs Lab 05/15/16 1030 05/15/16 2012 05/16/16 0230  TROPONINI 0.83* 0.81* 0.48*   No results for input(s): PROBNP in the last 168 hours.   CHEMISTRY  Recent Labs Lab 05/12/16 0320 05/13/16 0427 05/14/16 0439 05/14/16 1257 05/14/16 1711 05/15/16 0340 05/16/16 0359  NA 143 146* 144  --   --  142 142  K 4.0 3.9 4.4  --   --  5.2* 4.4  CL 112* 107 109  --   --  108 106  CO2 28 31 22   --   --  18* 29  GLUCOSE 85 98 67  --   --  127* 179*  BUN 30* 24* 35*  --   --  46* 45*  CREATININE 1.15 1.09 2.63*  --   --  3.53* 3.03*  CALCIUM 8.4* 8.5* 8.3*  --   --  8.0* 7.9*  MG  --   --  1.8 1.6* 1.7 2.0 2.0  PHOS  --   --  5.2* 5.9* 6.1* 6.1* 3.7   Estimated Creatinine Clearance: 33.3 mL/min (by C-G formula based on SCr of 3.03 mg/dL (H)).   LIVER  Recent Labs Lab 05/13/16 1659 05/14/16 0439 05/15/16 0340 05/15/16 2012 05/16/16 0359  AST  --   --  1,610*  --  1,884*  ALT  --   --  2,019*  --  1,414*  ALKPHOS  --   --  104  --  118  BILITOT  --   --  5.0*  --  5.5*  PROT  --   --  6.9  --  7.4  ALBUMIN  --   --  2.9*  --  3.2*  3.1*  INR 3.12 2.81 3.93 2.75 2.21     INFECTIOUS  Recent Labs Lab 05/11/16 0400 05/13/16 0427  05/14/16 0607 05/15/16 1030 05/16/16 0400  LATICACIDVEN  --   --   < > 8.3* 4.6* 2.0*  PROCALCITON 0.98 0.53  --   --   --   --   < > = values in this interval not displayed.  ENDOCRINE CBG (last 3)   Recent Labs  05/15/16 2322 05/16/16 0350 05/16/16 0722  GLUCAP 179* 75 158*         IMAGING x48h  - image(s) personally visualized  -   highlighted in bold Dg Chest Port 1 View  Result Date: 05/15/2016 CLINICAL DATA:  71 y/o  M; central line placement. EXAM: PORTABLE CHEST 1 VIEW COMPARISON:  05/15/2016 CT of the chest.  FINDINGS: Stable endotracheal tube, left central venous catheter, and enteric tube. Interval placement of a right central venous catheter with tip projecting over upper SVC. Surgical clips in region of right thyroid bed. Stable cardiac silhouette given projection and technique. Stable hazy opacifications of the lungs, left pleural effusion, and associated right basilar opacity. IMPRESSION: 1. Interval placement of right central venous catheter with tip projecting over upper SVC. Other lines and tubes are stable. 2. Stable right pleural effusion and bilateral airspace opacities. Electronically Signed   By: Mitzi Hansen M.D.   On: 05/15/2016 21:26   Dg Chest Port 1 View  Result Date: 05/15/2016 CLINICAL DATA:  Respiratory failure, ventilator dependent EXAM: PORTABLE CHEST 1 VIEW COMPARISON:  05/15/2016 at 4:46 a.m. FINDINGS: Endotracheal tube tip 2.5 cm above the carina. Left IJ line tip: SVC, with transverse orientation. Nasogastric tube extends into the stomach with some suggested in gaseous distention of the stomach. The patient is rotated to the right on today's radiograph, reducing diagnostic sensitivity and specificity. Mild cardiomegaly noted. Right pleural effusion with adjacent suspected atelectasis. Indistinct pulmonary vasculature compatible with pulmonary venous hypertension. Reduced interstitial accentuation compared prior. IMPRESSION: 1. Cardiomegaly and pulmonary venous hypertension with reduced interstitial edema. 2. Stable moderate right pleural effusion with passive atelectasis. 3. Tubes and lines remain satisfactorily positioned. Electronically Signed   By: Gaylyn Rong M.D.   On: 05/15/2016 19:42   Dg Chest Port 1 View  Result Date: 05/15/2016 CLINICAL DATA:  Acute respiratory failure with hypoxia. EXAM: PORTABLE CHEST 1 VIEW COMPARISON:  05/14/2016. FINDINGS: Endotracheal tube in satisfactory position. Nasogastric tube extending into the stomach. Left jugular catheter tip  in the proximal superior vena cava. Stable enlarged cardiac silhouette, bibasilar airspace opacity and right pleural effusion. Thoracic spine degenerative changes. IMPRESSION: Stable cardiomegaly, bilateral pulmonary edema and right pleural effusion. Electronically Signed   By: Beckie Salts M.D.   On: 05/15/2016 07:33      ASSESSMENT and PLAN  Acute respiratory failure (HCC) Due to MODS. Does not meet SBT crtieria  Plan Full vent support  Shock circulatory (HCC) Currently due to massive severe RP hemorrhage Improving pressor needs 05/16/2016   Plan Pressor support for MAP > 65  Retroperitoneal hemorrhage Large on CT 05/13/16 -= 05/14/16 On 05/16/2016 - improved but still coagulopathic and stil evidence of slow bleeding No role for surgery per CCS 05/15/16 Heme consult noted 05/15/16  Plan Support with blood products - 1unit prc, 1 unit ffp, 1 unit platelet Cbc q8h  Reduce stress dose steroids  Altered mental status RASS -3 with fentanyl gtt + prn. Can move left side on wua (baseline right weakness)  Plan fent gtt + sedation  Hemiparesis affecting right side as late effect of cerebrovascular accident [place]) Old baseline issue  On enteral nutrition restart tube feeds given shock some better  Goals of care, counseling/discussion Full code. Family very upset at outcome - see 05/14/16 and 05/15/16 High mortality situation and defnitely high morbidity situation   Plan Ongoing goals chaplain consult to be offered to family if they are interested  Acute renal failure (ARF) (HCC) Oliguric/anuric 05/15/2016; started CRRT after HD cath placement 05/15/16 (Right IJ) Renal ok with dc bicarb  Plan crrt per renal abg at 3pm  Electrolyte imbalance Being managed by renal       FAMILY  - Updates: 05/16/2016 --> no famioy at bedside currently/   - Inter-disciplinary family meet or Palliative Care meeting due by:  DAy 7. Current LOS is LOS 8 days  CODE STATUS    Code  Status Orders        Start     Ordered   05/09/16 0131  Full code  Continuous     05/09/16 0133    Code Status History    Date Active Date Inactive Code Status Order ID Comments User Context   This patient has a current code status but no historical code status.        DISPO Keep in ICU      The patient is critically ill with multiple organ systems failure and requires high complexity decision making for assessment and support, frequent evaluation and titration of therapies, application of advanced monitoring technologies and extensive interpretation of multiple databases.   Critical Care Time devoted to patient care services described in this note is  35  Minutes. This time reflects time of care of this signee Dr Kalman ShanMurali Santez [place]. This critical care time does not reflect procedure time, or teaching time or supervisory time of PA/NP/Med student/Med Resident etc but could involve care discussion time    Dr. Kalman ShanMurali Tarita Deshmukh, M.D., [facility].P Pulmonary and Critical Care Medicine Staff Physician [facility] [place] [facility] Pager: [phone], If no answer or between  15:00h - 7:00h: call [phone]  05/16/2016 8:53 AM

## 2016-05-16 NOTE — Progress Notes (Signed)
   05/16/16 1120  Clinical Encounter Type  Visited With Patient  Visit Type Other (Comment) ([place] consult)  Spiritual Encounters  Spiritual Needs Emotional  Stress Factors  Patient Stress Factors Not reviewed  Checked in Pt's room. Intubated. Family to come by later today. Offered silent prayer.

## 2016-05-16 NOTE — Progress Notes (Signed)
Patient ID: Omar [name], male   DOB: 02-22-1946, 71 y.o.   MRN: [id number]  [facility] Progress Note   Assessment/ Plan:   1. Acute kidney injury: Data appears to be most consistent with ATN arising from hemorrhagic shock. Some improvement of urine output noted overnight-started on CRRT for regulation of multiple metabolic abnormalities and to allow for aggressive administration of blood products/fluids as needed. The goal at this time is to keep him even from a fluid balance standpoint without NET ultrafiltration. 2. Hemorrhagic shock from acute retroperitoneal bleed: Decreased pressor support noted overnight likely as a result of blood products/increased oncotic pressure overnight. 3. Acute blood loss anemia secondary to retroperitoneal hemorrhage: Status post PRBC transfusion, continue to trend hemoglobin/hematocrit levels for indications of PRBCs. 4. Hyperkalemia: Mild and Corrected overnight with CRRT. 5. Elevated liver enzymes: Secondary to what appears to be most consistent with shock liver from hemorrhagic shock- transaminases trending down.  Subjective:   Improved hemodynamic status overnight with decreasing pressor needs. Slight improvement of urine output noted.    Objective:   BP 103/68   Pulse 80   Temp (!) 96.3 F (35.7 C)   Resp 11   Ht _0  (1.676 m)   Wt (!) 171 kg (376 lb 15.8 oz)   SpO2 91%   BMI 60.85 kg/m   Intake/Output Summary (Last 24 hours) at 05/16/16 3007 Last data filed at 05/16/16 0700  Gross per 24 hour  Intake           8683.5 ml  Output             2942 ml  Net           5741.5 ml   Weight change: -4.082 kg (-9 lb)  Physical Exam: [id number], sedated, morbidly obese man CVS: Pulse regular rhythm, normal rate, S1 and S2 normal Resp: Diminished breath sounds bilaterally-no distinct rales or rhonchi Abd: Soft, obese, nontender Ext: 2+ upper and lower extremity edema  Imaging: Dg Chest Port 1 View  Result Date:  05/15/2016 CLINICAL DATA:  71 y/o  M; central line placement. EXAM: PORTABLE CHEST 1 VIEW COMPARISON:  05/15/2016 CT of the chest. FINDINGS: Stable endotracheal tube, left central venous catheter, and enteric tube. Interval placement of a right central venous catheter with tip projecting over upper SVC. Surgical clips in region of right thyroid bed. Stable cardiac silhouette given projection and technique. Stable hazy opacifications of the lungs, left pleural effusion, and associated right basilar opacity. IMPRESSION: 1. Interval placement of right central venous catheter with tip projecting over upper SVC. Other lines and tubes are stable. 2. Stable right pleural effusion and bilateral airspace opacities. Electronically Signed   By: Kristine Garbe M.D.   On: 05/15/2016 21:26   Dg Chest Port 1 View  Result Date: 05/15/2016 CLINICAL DATA:  Respiratory failure, ventilator dependent EXAM: PORTABLE CHEST 1 VIEW COMPARISON:  05/15/2016 at 4:46 a.m. FINDINGS: Endotracheal tube tip 2.5 cm above the carina. Left IJ line tip: SVC, with transverse orientation. Nasogastric tube extends into the stomach with some suggested in gaseous distention of the stomach. The patient is rotated to the right on today's radiograph, reducing diagnostic sensitivity and specificity. Mild cardiomegaly noted. Right pleural effusion with adjacent suspected atelectasis. Indistinct pulmonary vasculature compatible with pulmonary venous hypertension. Reduced interstitial accentuation compared prior. IMPRESSION: 1. Cardiomegaly and pulmonary venous hypertension with reduced interstitial edema. 2. Stable moderate right pleural effusion with passive atelectasis. 3. Tubes and lines remain satisfactorily positioned. Electronically Signed  By: Van Clines M.D.   On: 05/15/2016 19:42   Dg Chest Port 1 View  Result Date: 05/15/2016 CLINICAL DATA:  Acute respiratory failure with hypoxia. EXAM: PORTABLE CHEST 1 VIEW COMPARISON:   05/14/2016. FINDINGS: Endotracheal tube in satisfactory position. Nasogastric tube extending into the stomach. Left jugular catheter tip in the proximal superior vena cava. Stable enlarged cardiac silhouette, bibasilar airspace opacity and right pleural effusion. Thoracic spine degenerative changes. IMPRESSION: Stable cardiomegaly, bilateral pulmonary edema and right pleural effusion. Electronically Signed   By: Claudie Revering M.D.   On: 05/15/2016 07:33    Labs: BMET  Recent Labs Lab 05/10/16 0548 05/10/16 1632 05/11/16 0400 05/12/16 0320 05/13/16 0427 05/14/16 0439 05/14/16 1257 05/14/16 1711 05/15/16 0340 05/16/16 0359  NA 141  --  142 143 146* 144  --   --  142 142  K 3.7  --  3.3* 4.0 3.9 4.4  --   --  5.2* 4.4  CL 110  --  109 112* 107 109  --   --  108 106  CO2 26  --  _0 --   --  18* 29  GLUCOSE 98  --  111* 85 98 67  --   --  127* 179*  BUN 34*  --  41* 30* 24* 35*  --   --  46* 45*  CREATININE 1.01  --  1.32* 1.15 1.09 2.63*  --   --  3.53* 3.03*  CALCIUM 8.8*  --  8.3* 8.4* 8.5* 8.3*  --   --  8.0* 7.9*  PHOS 3.0 4.2  --   --   --  5.2* 5.9* 6.1* 6.1* 3.7   CBC  Recent Labs Lab 05/13/16 1730  05/15/16 0321  05/15/16 1016 05/15/16 1523 05/15/16 2012 05/16/16 0359  WBC 9.7  < > 19.4*  < > 19.5* 19.2* 17.0* 12.6*  NEUTROABS 6.9  --  16.5*  --   --  17.1*  --  11.4*  HGB 7.5*  < > 7.5*  < > 7.5* 8.0* 7.3* 7.0*  HCT 24.8*  < > 23.6*  < > 23.5* 25.1* 23.1* 21.4*  MCV 99.6  < > 95.5  < > 94.4 94.4 93.9 94.7  PLT 114*  < > 107*  < > 115* 140* 122* 97*  < > = values in this interval not displayed.  Medications:    . atorvastatin  20 mg Oral q1800  . cefTRIAXone (ROCEPHIN)  IV  2 g Intravenous Q24H  . chlorhexidine gluconate (MEDLINE KIT)  15 mL Mouth Rinse BID  . Chlorhexidine Gluconate Cloth  6 each Topical Daily  . feeding supplement (PRO-STAT SUGAR FREE 64)  30 mL Per Tube QID  . fentaNYL (SUBLIMAZE) injection  50 mcg Intravenous Once  .  hydrocortisone sod succinate (SOLU-CORTEF) inj  50 mg Intravenous Q6H  . insulin aspart  0-9 Units Subcutaneous Q4H  . levothyroxine  62.5 mcg Intravenous Daily  . mouth rinse  15 mL Mouth Rinse QID  . metoprolol  10 mg Intravenous Q6H  . pantoprazole (PROTONIX) IV  40 mg Intravenous Q24H      Elmarie Shiley, MD 05/16/2016, 7:29 AM

## 2016-05-17 ENCOUNTER — Inpatient Hospital Stay (HOSPITAL_COMMUNITY): Payer: Medicare Other

## 2016-05-17 DIAGNOSIS — N170 Acute kidney failure with tubular necrosis: Secondary | ICD-10-CM

## 2016-05-17 DIAGNOSIS — K7200 Acute and subacute hepatic failure without coma: Secondary | ICD-10-CM

## 2016-05-17 DIAGNOSIS — Z5309 Procedure and treatment not carried out because of other contraindication: Secondary | ICD-10-CM

## 2016-05-17 DIAGNOSIS — Z79899 Other long term (current) drug therapy: Secondary | ICD-10-CM

## 2016-05-17 DIAGNOSIS — T68XXXA Hypothermia, initial encounter: Secondary | ICD-10-CM

## 2016-05-17 LAB — CBC
HEMATOCRIT: 23.2 % — AB (ref 39.0–52.0)
HEMATOCRIT: 23.1 % — AB (ref 39.0–52.0)
HEMOGLOBIN: 7.4 g/dL — AB (ref 13.0–17.0)
Hemoglobin: 7.7 g/dL — ABNORMAL LOW (ref 13.0–17.0)
MCH: 30.9 pg (ref 26.0–34.0)
MCH: 30.1 pg (ref 26.0–34.0)
MCHC: 33.2 g/dL (ref 30.0–36.0)
MCHC: 32 g/dL (ref 30.0–36.0)
MCV: 93.2 fL (ref 78.0–100.0)
MCV: 93.9 fL (ref 78.0–100.0)
PLATELETS: 79 10*3/uL — AB (ref 150–400)
Platelets: 81 10*3/uL — ABNORMAL LOW (ref 150–400)
RBC: 2.49 MIL/uL — ABNORMAL LOW (ref 4.22–5.81)
RBC: 2.46 MIL/uL — ABNORMAL LOW (ref 4.22–5.81)
RDW: 18 % — ABNORMAL HIGH (ref 11.5–15.5)
RDW: 17.6 % — ABNORMAL HIGH (ref 11.5–15.5)
WBC: 12.9 10*3/uL — AB (ref 4.0–10.5)
WBC: 13.3 10*3/uL — ABNORMAL HIGH (ref 4.0–10.5)

## 2016-05-17 LAB — PREPARE PLATELET PHERESIS: Unit division: 0

## 2016-05-17 LAB — BPAM RBC
BLOOD PRODUCT EXPIRATION DATE: 201803172359
BLOOD PRODUCT EXPIRATION DATE: 201804022359
BLOOD PRODUCT EXPIRATION DATE: 201804052359
BLOOD PRODUCT EXPIRATION DATE: 201804062359
BLOOD PRODUCT EXPIRATION DATE: 201804062359
Blood Product Expiration Date: 201803172359
Blood Product Expiration Date: 201804032359
ISSUE DATE / TIME: 201803101236
ISSUE DATE / TIME: 201803121234
ISSUE DATE / TIME: 201803131159
ISSUE DATE / TIME: 201803101715
ISSUE DATE / TIME: 201803111040
ISSUE DATE / TIME: 201803112113
ISSUE DATE / TIME: 201803140610
UNIT TYPE AND RH: 7300
UNIT TYPE AND RH: 7300
UNIT TYPE AND RH: 7300
Unit Type and Rh: 7300
Unit Type and Rh: 7300
Unit Type and Rh: 7300
Unit Type and Rh: 7300

## 2016-05-17 LAB — GLUCOSE, CAPILLARY
GLUCOSE-CAPILLARY: 131 mg/dL — AB (ref 65–99)
GLUCOSE-CAPILLARY: 113 mg/dL — AB (ref 65–99)
GLUCOSE-CAPILLARY: 130 mg/dL — AB (ref 65–99)
Glucose-Capillary: 119 mg/dL — ABNORMAL HIGH (ref 65–99)
Glucose-Capillary: 128 mg/dL — ABNORMAL HIGH (ref 65–99)
Glucose-Capillary: 135 mg/dL — ABNORMAL HIGH (ref 65–99)
Glucose-Capillary: 40 mg/dL — CL (ref 65–99)

## 2016-05-17 LAB — RENAL FUNCTION PANEL
ALBUMIN: 2.7 g/dL — AB (ref 3.5–5.0)
ANION GAP: 8 (ref 5–15)
Albumin: 2.8 g/dL — ABNORMAL LOW (ref 3.5–5.0)
Anion gap: 8 (ref 5–15)
BUN: 33 mg/dL — AB (ref 6–20)
BUN: 33 mg/dL — ABNORMAL HIGH (ref 6–20)
CHLORIDE: 104 mmol/L (ref 101–111)
CO2: 27 mmol/L (ref 22–32)
CO2: 26 mmol/L (ref 22–32)
CREATININE: 2.02 mg/dL — AB (ref 0.61–1.24)
Calcium: 8.1 mg/dL — ABNORMAL LOW (ref 8.9–10.3)
Calcium: 7.9 mg/dL — ABNORMAL LOW (ref 8.9–10.3)
Chloride: 103 mmol/L (ref 101–111)
Creatinine, Ser: 1.99 mg/dL — ABNORMAL HIGH (ref 0.61–1.24)
GFR calc Af Amer: 37 mL/min — ABNORMAL LOW (ref >60)
GFR calc non Af Amer: 32 mL/min — ABNORMAL LOW (ref >60)
GFR, EST AFRICAN AMERICAN: 36 mL/min — AB (ref >60)
GFR, EST NON AFRICAN AMERICAN: 31 mL/min — AB (ref >60)
GLUCOSE: 148 mg/dL — AB (ref 65–99)
Glucose, Bld: 144 mg/dL — ABNORMAL HIGH (ref 65–99)
PHOSPHORUS: 1.8 mg/dL — AB (ref 2.5–4.6)
POTASSIUM: 4.3 mmol/L (ref 3.5–5.1)
POTASSIUM: 4.1 mmol/L (ref 3.5–5.1)
Phosphorus: 2 mg/dL — ABNORMAL LOW (ref 2.5–4.6)
SODIUM: 138 mmol/L (ref 135–145)
SODIUM: 138 mmol/L (ref 135–145)

## 2016-05-17 LAB — TYPE AND SCREEN
ABO/RH(D): B POS
ANTIBODY SCREEN: NEGATIVE
UNIT DIVISION: 0
UNIT DIVISION: 0
UNIT DIVISION: 0
UNIT DIVISION: 0
Unit division: 0
Unit division: 0
Unit division: 0

## 2016-05-17 LAB — HEPATIC FUNCTION PANEL
ALBUMIN: 2.8 g/dL — AB (ref 3.5–5.0)
ALK PHOS: 133 U/L — AB (ref 38–126)
ALT: 1106 U/L — ABNORMAL HIGH (ref 17–63)
AST: 1041 U/L — AB (ref 15–41)
BILIRUBIN TOTAL: 5.8 mg/dL — AB (ref 0.3–1.2)
Bilirubin, Direct: 3.3 mg/dL — ABNORMAL HIGH (ref 0.1–0.5)
Indirect Bilirubin: 2.5 mg/dL — ABNORMAL HIGH (ref 0.3–0.9)
Total Protein: 6.6 g/dL (ref 6.5–8.1)

## 2016-05-17 LAB — PROTIME-INR
INR: 1.78
PROTHROMBIN TIME: 21 s — AB (ref 11.4–15.2)

## 2016-05-17 LAB — CBC WITH DIFFERENTIAL/PLATELET
Basophils Absolute: 0 10*3/uL (ref 0.0–0.1)
Basophils Relative: 0 %
EOS ABS: 0 10*3/uL (ref 0.0–0.7)
Eosinophils Relative: 0 %
HEMATOCRIT: 22 % — AB (ref 39.0–52.0)
HEMOGLOBIN: 7.2 g/dL — AB (ref 13.0–17.0)
LYMPHS ABS: 0.4 10*3/uL — AB (ref 0.7–4.0)
Lymphocytes Relative: 4 %
MCH: 30.3 pg (ref 26.0–34.0)
MCHC: 32.7 g/dL (ref 30.0–36.0)
MCV: 92.4 fL (ref 78.0–100.0)
MONO ABS: 0.9 10*3/uL (ref 0.1–1.0)
MONOS PCT: 7 %
NEUTROS PCT: 89 %
Neutro Abs: 10.5 10*3/uL — ABNORMAL HIGH (ref 1.7–7.7)
Platelets: 96 10*3/uL — ABNORMAL LOW (ref 150–400)
RBC: 2.38 MIL/uL — ABNORMAL LOW (ref 4.22–5.81)
RDW: 18.2 % — AB (ref 11.5–15.5)
WBC: 11.8 10*3/uL — ABNORMAL HIGH (ref 4.0–10.5)

## 2016-05-17 LAB — POCT I-STAT 3, ART BLOOD GAS (G3+)
Acid-Base Excess: 3 mmol/L — ABNORMAL HIGH (ref 0.0–2.0)
BICARBONATE: 27 mmol/L (ref 20.0–28.0)
O2 Saturation: 92 %
PO2 ART: 57 mmHg — AB (ref 83.0–108.0)
Patient temperature: 36.2
TCO2: 28 mmol/L (ref 0–100)
pCO2 arterial: 36.4 mmHg (ref 32.0–48.0)
pH, Arterial: 7.476 — ABNORMAL HIGH (ref 7.350–7.450)

## 2016-05-17 LAB — BPAM FFP
Blood Product Expiration Date: 201803182359
ISSUE DATE / TIME: 201803131032
Unit Type and Rh: 1700

## 2016-05-17 LAB — BPAM PLATELET PHERESIS
Blood Product Expiration Date: 201803152359
ISSUE DATE / TIME: 201803130932
Unit Type and Rh: 6200

## 2016-05-17 LAB — PREPARE FRESH FROZEN PLASMA: Unit division: 0

## 2016-05-17 LAB — LACTIC ACID, PLASMA: LACTIC ACID, VENOUS: 1.9 mmol/L (ref 0.5–1.9)

## 2016-05-17 LAB — MAGNESIUM: Magnesium: 2.1 mg/dL (ref 1.7–2.4)

## 2016-05-17 MED ORDER — SODIUM CHLORIDE 0.9 % IV SOLN
INTRAVENOUS | Status: DC
  Administered 2016-05-17 – 2016-05-21 (×3): via INTRAVENOUS

## 2016-05-17 MED ORDER — PANTOPRAZOLE SODIUM 40 MG PO PACK
40.0000 mg | PACK | Freq: Every day | ORAL | Status: DC
  Administered 2016-05-18 – 2016-05-23 (×6): 40 mg
  Filled 2016-05-17 (×7): qty 20

## 2016-05-17 MED ORDER — LEVOTHYROXINE SODIUM 125 MCG PO TABS
125.0000 ug | ORAL_TABLET | Freq: Every day | ORAL | Status: DC
  Administered 2016-05-18 – 2016-05-22 (×5): 125 ug via ORAL
  Filled 2016-05-17 (×6): qty 1

## 2016-05-17 NOTE — Progress Notes (Signed)
PCCM Progress Note  Admission date: 05/31/2016 Referring provider: Dr. Clarene Duke, [facility] ER  CC: altered mental status  HPI: 71 y/o male from [facility] admitted 3/5 with altered mental status with aspiration, sepsis, hypoxic/hypercapnic respiratory failure.     has a past medical history of Asthma; Atrial fibrillation (HCC); Gout; Hypertension; Morbid obesity (HCC); Multinodular goiter; Osteoarthrosis and allied disorders; Other and unspecified hyperlipidemia; Stroke [facility]); Thrombocytopenia (HCC); and Vitamin D deficiency.    Antibiotics: Zosyn 3/06 >> 3/08 Rocephin (H.Flu PNA) 3/08 >>  Cultures: Urine 3/05 >> negative Blood 3/06 >>   Lines/tubes: ETT 3/05 >> 3/8 Lt IJ CVL 3/07 >>   Events: 3/05  Transfer to [facility] Sputum 3/06 >> Haemophilus influenzae (beta lactamase positive) Echo 3/06 >> EF 65 to 70%, mod LVH, mod TR, PAS 39 mmHg CT head 3/7 >> no acute abnormality   3/08  extubated off pressors. No longer meeting sirs criteria  3/10  Reintubated, concern for bleed with drop in Hgb on heparin gtt, resp distress, AF w RVR.   CT ABD/Pelvis 3/10 >> large right retroperitoneal hematoma, small bilateral pleural effusions with extensive consolidation/atelectasis 3/11  Received 4 FFP overnight, 2 units PRBC  3/11  Remains on high dose vasopressors, CT c/w retroperitoneal bleed  RN reports hypotension on levo + neo.  FFP infusing.   3/12 - max pressors. Shock liver. Deeply sedated. S/p 4 units prbc and 4 unit ffp as of last night -> got 1 unit prbc, platlelet   And FFP x 5 -> HD cath placed . Famiily very angry at outcome of RP hemorrhage. Reports he is ssensitive to heparin based on 2013 experience. CCS -> no ro9le for surgery. Heme consult ->  Done. CRRT started  3/13 - events from yesterday noted. Down on vasopressor need. Down to levophed 18, neo 50, and vasopresson 0.03. Ongoing CRRT. On 200 fentantyl mcg and RN reports responsiveness on left. Shock liver better. Renal rec dc bic gtt. S/p  PRBC, FFP and platlets   SUBJECTIVE/OVERNIGHT/INTERVAL HX 3/14 - off neo, still on levophed 14 and shock dose vasop. Improved ur op but still in oliguric bleed. On Lawyer  . Shock liver better. Daughte at bedside: still very upset that patient was given IV heparin gtt   Vital signs: BP 100/69   Pulse 75   Temp 97.7 F (36.5 C)   Resp 20   Ht 5\' 6"  (1.676 m)   Wt (!) 169.2 kg (373 lb)   SpO2 99%   BMI 60.20 kg/m   Intake/output:  Intake/Output Summary (Last 24 hours) at 05/17/16 1013 Last data filed at 05/17/16 1000  Gross per 24 hour  Intake          3412.91 ml  Output             3577 ml  Net          -164.09 ml   I/O last 3 completed shifts: In: 7574.8 [I.V.:4565.7; Blood:1719.1; Other:120; NG/GT:1070; IV Piggyback:100] Out: [id number] [Urine:292; Other:6605]    EXAM  General Appearance:    Looks criticall ill OBESE - yes. ON BAIR HUGGER  Head:    Normocephalic, without obvious abnormality, atraumatic  Eyes:    PERRL - yes, conjunctiva/corneas - clear      Ears:    Normal external ear canals, both ears  Nose:   NG tube - no  Throat:  ETT TUBE - yes , OG tube - yes and tolerating TF  Neck:   Supple,  No  enlargement/tenderness/nodules     Lungs:     Clear to auscultation bilaterally, Ventilator   Synchrony - yes  Chest wall:    No deformity  Heart:    S1 and S2 normal, no murmur, CVP - no.  Pressors - yes  Abdomen:     Soft, no masses, no organomegaly  Genitalia:    Not done  Rectal:   not done  Extremities:   Extremities- edema     Skin:   Intact in exposed areas . Sacral area - no reported decub     Neurologic:   Sedation - fent gtt -> RASS - -3 . Moves all 4s - moves left side per report. CAM-ICU - unable to assess . Orientation - not       LABS  PULMONARY  Recent Labs Lab 05/13/16 1211  05/14/16 0727 05/14/16 1044 05/15/16 0333 05/15/16 1522 05/16/16 0515 05/16/16 1500 05/17/16 0251  PHART 7.318*  < > 7.288* 7.252* 7.283* 7.317* 7.292* 7.373  7.476*  PCO2ART 53.8*  < > 41.1 44.1 40.6 44.0 60.8* 49.0* 36.4  PO2ART 89.0  < > 72.0* 84.0 72.0* 77.2* 71.7* 72.2* 57.0*  HCO3 27.6  < > 19.7* 19.3* 19.3* 21.9 28.5* 27.8 27.0  TCO2 29  --  21 21 21   --   --   --  28  O2SAT 96.0  < > 92.0 94.0 93.0 94.4 92.8 94.9 92.0  < > = values in this interval not displayed.  CBC  Recent Labs Lab 05/16/16 1216 05/16/16 1900 05/17/16 0352  HGB 7.2* 7.5* 7.2*  HCT 22.1* 23.0* 22.0*  WBC 11.6* 12.8* 11.8*  PLT 115* 113* 96*    COAGULATION  Recent Labs Lab 05/14/16 0439 05/15/16 0340 05/15/16 2012 05/16/16 0359 05/17/16 0352  INR 2.81 3.93 2.75 2.21 1.78    CARDIAC   Recent Labs Lab 05/15/16 1030 05/15/16 2012 05/16/16 0230  TROPONINI 0.83* 0.81* 0.48*   No results for input(s): PROBNP in the last 168 hours.   CHEMISTRY  Recent Labs Lab 05/14/16 0439 05/14/16 1257 05/14/16 1711 05/15/16 0340 05/16/16 0359 05/16/16 1520 05/17/16 0352 05/17/16 0353  NA 144  --   --  142 142 139  --  138  K 4.4  --   --  5.2* 4.4 4.3  --  4.3  CL 109  --   --  108 106 102  --  103  CO2 22  --   --  18* 29 27  --  27  GLUCOSE 67  --   --  127* 179* 136*  --  148*  BUN 35*  --   --  46* 45* 36*  --  33*  CREATININE 2.63*  --   --  3.53* 3.03* 2.33*  --  1.99*  CALCIUM 8.3*  --   --  8.0* 7.9* 8.0*  --  8.1*  MG 1.8 1.6* 1.7 2.0 2.0  --  2.1  --   PHOS 5.2* 5.9* 6.1* 6.1* 3.7 2.7  --  2.0*   Estimated Creatinine Clearance: 50.3 mL/min (by C-G formula based on SCr of 1.99 mg/dL (H)).   LIVER  Recent Labs Lab 05/14/16 0439 05/15/16 0340 05/15/16 2012 05/16/16 0359 05/16/16 1520 05/17/16 0352 05/17/16 0353  AST  --  3,667*  --  1,884*  --  1,041*  --   ALT  --  2,019*  --  1,414*  --  1,106*  --   ALKPHOS  --  104  --  118  --  133*  --   BILITOT  --  5.0*  --  5.5*  --  5.8*  --   PROT  --  6.9  --  7.4  --  6.6  --   ALBUMIN  --  2.9*  --  3.2*  3.1* 3.0* 2.8* 2.8*  INR 2.81 3.93 2.75 2.21  --  1.78  --       INFECTIOUS  Recent Labs Lab 05/11/16 0400 05/13/16 0427  05/15/16 1030 05/16/16 0400 05/16/16 2329  LATICACIDVEN  --   --   < > 4.6* 2.0* 1.9  PROCALCITON 0.98 0.53  --   --   --   --   < > = values in this interval not displayed.   ENDOCRINE CBG (last 3)   Recent Labs  05/17/16 0352 05/17/16 0730 05/17/16 0737  GLUCAP 131* 40* 113*         IMAGING x48h  - image(s) personally visualized  -   highlighted in bold Dg Chest Port 1 View  Result Date: 05/17/2016 CLINICAL DATA:  Intubation. EXAM: PORTABLE CHEST 1 VIEW COMPARISON:  05/15/2016 . FINDINGS: Endotracheal tube, NG tube, left IJ line, right IJ line stable position. Cardiomegaly with pulmonary vascular congestion and bilateral pulmonary infiltrates consistent pulmonary edema. Small bilateral pleural effusions. Similar findings noted on prior exam. No pneumothorax. Surgical clips right neck. IMPRESSION: 1. Lines and tubes in stable position. 2. Congestive heart failure bilateral pulmonary edema and bilateral pleural effusions, no change prior exam. Electronically Signed   By: Maisie Fushomas  Register   On: 05/17/2016 07:42   Dg Chest Port 1 View  Result Date: 05/15/2016 CLINICAL DATA:  71 y/o  M; central line placement. EXAM: PORTABLE CHEST 1 VIEW COMPARISON:  05/15/2016 CT of the chest. FINDINGS: Stable endotracheal tube, left central venous catheter, and enteric tube. Interval placement of a right central venous catheter with tip projecting over upper SVC. Surgical clips in region of right thyroid bed. Stable cardiac silhouette given projection and technique. Stable hazy opacifications of the lungs, left pleural effusion, and associated right basilar opacity. IMPRESSION: 1. Interval placement of right central venous catheter with tip projecting over upper SVC. Other lines and tubes are stable. 2. Stable right pleural effusion and bilateral airspace opacities. Electronically Signed   By: Mitzi HansenLance  Furusawa-Stratton M.D.   On:  05/15/2016 21:26   Dg Chest Port 1 View  Result Date: 05/15/2016 CLINICAL DATA:  Respiratory failure, ventilator dependent EXAM: PORTABLE CHEST 1 VIEW COMPARISON:  05/15/2016 at 4:46 a.m. FINDINGS: Endotracheal tube tip 2.5 cm above the carina. Left IJ line tip: SVC, with transverse orientation. Nasogastric tube extends into the stomach with some suggested in gaseous distention of the stomach. The patient is rotated to the right on today's radiograph, reducing diagnostic sensitivity and specificity. Mild cardiomegaly noted. Right pleural effusion with adjacent suspected atelectasis. Indistinct pulmonary vasculature compatible with pulmonary venous hypertension. Reduced interstitial accentuation compared prior. IMPRESSION: 1. Cardiomegaly and pulmonary venous hypertension with reduced interstitial edema. 2. Stable moderate right pleural effusion with passive atelectasis. 3. Tubes and lines remain satisfactorily positioned. Electronically Signed   By: Gaylyn RongWalter  Liebkemann M.D.   On: 05/15/2016 19:42       ASSESSMENT and PLAN  Acute respiratory failure (HCC) Due to MODS. Does not meet SBT crtieria  Plan Full vent support  Shock circulatory (HCC) Currently due to massive severe RP hemorrhage Improving pressor needs 05/17/2016   Plan Pressor support for MAP > 65  Retroperitoneal hemorrhage  Large on CT 05/13/16 -= 05/14/16 On 05/16/2016 - improved but still coagulopathic and stil evidence of slow bleeding No role for surgery per CCS 05/15/16 Heme consult noted 05/15/16   Plan Support with blood products as needed Cbc q8h  stress dose steroids - aim to dc 05/18/16   Altered mental status RASS -3 with fentanyl gtt + prn. Can move left side on wua (baseline right weakness)  Plan fent gtt + sedation  Hemiparesis affecting right side as late effect of cerebrovascular accident [facility] Old baseline issue  On enteral nutrition Restarted  tube feeds 05/16/16 and tolerating it well  05/17/2016   Goals of care, counseling/discussion Full code. Family very upset at outcome - see 05/14/16 and 05/15/16 High mortality situation and defnitely high morbidity situation  On 05/17/16 - daughter at bedside: very upset that Iv Heparin gtt was given. Says that in 2013 for A Fib stroke admit got IV heparin gtt and bled around heart. Upset to heart hat this is not in record because [facility] was [facility]. She says other anticoagulants including coumadin fine but not IV heparing ttt. Reports that she informed both at [facility] and [facility] to staff that patient should never get IV heparin gttt (she did this a priori per her). Upset that we did not take this into account and list in allergy list and still gave IV heparin gtt. REports that staff used words like "Trial and Error".    Plan Emphathtic listening Ongoing goals chaplain consult to be offered to family if they are interested Full code  Acute renal failure (ARF) (HCC) Oliguric/anuric 05/15/2016; started CRRT after HD cath placement 05/15/16 (Right IJ) Ur OP improving  Plan crrt per renal   Electrolyte imbalance Being managed by renal   Shock liver Since RP bleed. 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 Current LOS is LOS 9 days  CODE STATUS    Code Status Orders        Start     Ordered   05/09/16 0131  Full code  Continuous      05/09/16 0133    Code Status History    Date Active Date Inactive Code Status Order ID Comments User Context   This patient has a current code status but no historical code status.        DISPO Keep in ICU      The patient is critically ill with multiple organ systems failure and requires high complexity decision making for assessment and support, frequent evaluation and titration of therapies, application of advanced monitoring technologies and extensive interpretation of multiple databases.   Critical Care Time devoted to patient care services described in this note is  40  Minutes. This time reflects time of care of this signee Dr Kalman Shan. This critical care time does not reflect procedure time, or teaching time or supervisory time of PA/NP/Med student/Med Resident etc but  could involve care discussion time    Dr. Kalman Shan, M.D., [facility].C.P Pulmonary and Critical Care Medicine Staff Physician [facility]monary [facility] Critical Care Pager: [phone], If no answer or between  15:00h - 7:00h: call [phone]  05/17/2016 10:15 AM

## 2016-05-17 NOTE — Assessment & Plan Note (Addendum)
Atrial fibrillation, HR 120-130's 3/19 May need to consider rate control, amiodarone. Suspect discomfort playing a role here.  Will need to address anticoagulation once stable from RP bleed

## 2016-05-17 NOTE — Progress Notes (Signed)
Notified MD Ramaswamy in regards to patient's CBC. Patient HgB increased from 7.2-->7.5, Platelets 96-->79.  MD Ramaswamy ordered to monitor Platelets for now. Will continue to monitor and assess.

## 2016-05-17 NOTE — Progress Notes (Signed)
PCCM Progress Note  Admission date: 05/22/2016 Referring provider: Dr. Clarene Duke, [facility] ER  CC: altered mental status  HPI: 71 y/o male from [facility] admitted 3/5 with altered mental status with aspiration, sepsis, hypoxic/hypercapnic respiratory failure.     has a past medical history of Asthma; Atrial fibrillation (HCC); Gout; Hypertension; Morbid obesity (HCC); Multinodular goiter; Osteoarthrosis and allied disorders; Other and unspecified hyperlipidemia; Stroke [facility]); Thrombocytopenia (HCC); and Vitamin D deficiency.    Antibiotics: Zosyn 3/06 >> 3/08 Rocephin (H.Flu PNA) 3/08 >>  Cultures: Urine 3/05 >> negative Blood 3/06 >>neg Sputum 3/6 >>hflu   Lines/tubes: ETT 3/05 >> 3/8 Lt IJ CVL 3/07 >>   Events: 3/05  Transfer to [facility] Sputum 3/06 >> Haemophilus influenzae (beta lactamase positive) Echo 3/06 >> EF 65 to 70%, mod LVH, mod TR, PAS 39 mmHg CT head 3/7 >> no acute abnormality   3/08  extubated off pressors. No longer meeting sirs criteria  3/10  Reintubated, concern for bleed with drop in Hgb on heparin gtt, resp distress, AF w RVR.   CT ABD/Pelvis 3/10 >> large right retroperitoneal hematoma, small bilateral pleural effusions with extensive consolidation/atelectasis 3/11  Received 4 FFP overnight, 2 units PRBC  3/11  Remains on high dose vasopressors, CT c/w retroperitoneal bleed  RN reports hypotension on levo + neo.  FFP infusing.   3/12 - max pressors. Shock liver. Deeply sedated. S/p 4 units prbc and 4 unit ffp as of last night -> got 1 unit prbc, platlelet   And FFP x 5 -> HD cath placed . Famiily very angry at outcome of RP hemorrhage. Reports he is ssensitive to heparin based on 2013 experience. CCS -> no ro9le for surgery. Heme consult ->  Done. CRRT started   SUBJECTIVE/OVERNIGHT/INTERVAL HX 3/14 - events from yesterday noted. Down on vasopressor need. Down to levophed 18, neo 50, Decreased pressor needs.   Vital signs: BP 100/69   Pulse 75   Temp 97.7 F (36.5  C)   Resp 20   Ht 5\' 6"  (1.676 m)   Wt (!) 169.2 kg (373 lb)   SpO2 99%   BMI 60.20 kg/m   Intake/output:  Intake/Output Summary (Last 24 hours) at 05/17/16 1012 Last data filed at 05/17/16 1000  Gross per 24 hour  Intake          3412.91 ml  Output             3577 ml  Net          -164.09 ml    General Appearance:    Looks criticall ill OBESE (373 lbs)  Head:    Normocephalic, without obvious abnormality, atraumatic  Eyes:    PERRL - equal, conjunctiva/corneas - clear      Ears:    Normal external ear canals, both ears  Nose:   NG tube - no  Throat:  ETT TUBE - yes , OG tube - yes  Neck:   Supple,  No enlargement/tenderness/nodules     Lungs:     Clear to auscultation bilaterally, Ventilator   Synchrony - yes   Chest wall:    No deformity  Heart:    S1 and S2 normal, no murmur, CVP - na.  Pressors - yes  Abdomen:     Soft, no masses, no organomegaly  Genitalia:    Not done  Rectal:   not done  Extremities:   Extremities- edema     Skin:   Intact in exposed areas . Sacral area -  no decub per report     Neurologic:  sedated on vent.    PULMONARY  Recent Labs Lab 05/13/16 1211  05/14/16 0727 05/14/16 1044 05/15/16 0333 05/15/16 1522 05/16/16 0515 05/16/16 1500 05/17/16 0251  PHART 7.318*  < > 7.288* 7.252* 7.283* 7.317* 7.292* 7.373 7.476*  PCO2ART 53.8*  < > 41.1 44.1 40.6 44.0 60.8* 49.0* 36.4  PO2ART 89.0  < > 72.0* 84.0 72.0* 77.2* 71.7* 72.2* 57.0*  HCO3 27.6  < > 19.7* 19.3* 19.3* 21.9 28.5* 27.8 27.0  TCO2 29  --  21 21 21   --   --   --  28  O2SAT 96.0  < > 92.0 94.0 93.0 94.4 92.8 94.9 92.0  < > = values in this interval not displayed.  CBC  Recent Labs Lab 05/16/16 1216 05/16/16 1900 05/17/16 0352  HGB 7.2* 7.5* 7.2*  HCT 22.1* 23.0* 22.0*  WBC 11.6* 12.8* 11.8*  PLT 115* 113* 96*    COAGULATION  Recent Labs Lab 05/14/16 0439 05/15/16 0340 05/15/16 2012 05/16/16 0359 05/17/16 0352  INR 2.81 3.93 2.75 2.21 1.78    CARDIAC     Recent Labs Lab 05/15/16 1030 05/15/16 2012 05/16/16 0230  TROPONINI 0.83* 0.81* 0.48*   No results for input(s): PROBNP in the last 168 hours.   CHEMISTRY  Recent Labs Lab 05/14/16 0439 05/14/16 1257 05/14/16 1711 05/15/16 0340 05/16/16 0359 05/16/16 1520 05/17/16 0352 05/17/16 0353  NA 144  --   --  142 142 139  --  138  K 4.4  --   --  5.2* 4.4 4.3  --  4.3  CL 109  --   --  108 106 102  --  103  CO2 22  --   --  18* 29 27  --  27  GLUCOSE 67  --   --  127* 179* 136*  --  148*  BUN 35*  --   --  46* 45* 36*  --  33*  CREATININE 2.63*  --   --  3.53* 3.03* 2.33*  --  1.99*  CALCIUM 8.3*  --   --  8.0* 7.9* 8.0*  --  8.1*  MG 1.8 1.6* 1.7 2.0 2.0  --  2.1  --   PHOS 5.2* 5.9* 6.1* 6.1* 3.7 2.7  --  2.0*   Estimated Creatinine Clearance: 50.3 mL/min (by C-G formula based on SCr of 1.99 mg/dL (H)).   LIVER  Recent Labs Lab 05/14/16 0439 05/15/16 0340 05/15/16 2012 05/16/16 0359 05/16/16 1520 05/17/16 0352 05/17/16 0353  AST  --  1,096*  --  1,884*  --  1,041*  --   ALT  --  2,019*  --  1,414*  --  1,106*  --   ALKPHOS  --  104  --  118  --  133*  --   BILITOT  --  5.0*  --  5.5*  --  5.8*  --   PROT  --  6.9  --  7.4  --  6.6  --   ALBUMIN  --  2.9*  --  3.2*  3.1* 3.0* 2.8* 2.8*  INR 2.81 3.93 2.75 2.21  --  1.78  --      INFECTIOUS  Recent Labs Lab 05/11/16 0400 05/13/16 0427  05/15/16 1030 05/16/16 0400 05/16/16 2329  LATICACIDVEN  --   --   < > 4.6* 2.0* 1.9  PROCALCITON 0.98 0.53  --   --   --   --   < > =  values in this interval not displayed.   ENDOCRINE CBG (last 3)   Recent Labs  05/17/16 0352 05/17/16 0730 05/17/16 0737  GLUCAP 131* 40* 113*         IMAGING x48h  - image(s) personally visualized  -   highlighted in bold Dg Chest Port 1 View  Result Date: 05/17/2016 CLINICAL DATA:  Intubation. EXAM: PORTABLE CHEST 1 VIEW COMPARISON:  05/15/2016 . FINDINGS: Endotracheal tube, NG tube, left IJ line, right IJ line  stable position. Cardiomegaly with pulmonary vascular congestion and bilateral pulmonary infiltrates consistent pulmonary edema. Small bilateral pleural effusions. Similar findings noted on prior exam. No pneumothorax. Surgical clips right neck. IMPRESSION: 1. Lines and tubes in stable position. 2. Congestive heart failure bilateral pulmonary edema and bilateral pleural effusions, no change prior exam. Electronically Signed   By: Maisie Fushomas  Register   On: 05/17/2016 07:42   Dg Chest Port 1 View  Result Date: 05/15/2016 CLINICAL DATA:  71 y/o  M; central line placement. EXAM: PORTABLE CHEST 1 VIEW COMPARISON:  05/15/2016 CT of the chest. FINDINGS: Stable endotracheal tube, left central venous catheter, and enteric tube. Interval placement of a right central venous catheter with tip projecting over upper SVC. Surgical clips in region of right thyroid bed. Stable cardiac silhouette given projection and technique. Stable hazy opacifications of the lungs, left pleural effusion, and associated right basilar opacity. IMPRESSION: 1. Interval placement of right central venous catheter with tip projecting over upper SVC. Other lines and tubes are stable. 2. Stable right pleural effusion and bilateral airspace opacities. Electronically Signed   By: Mitzi HansenLance  Furusawa-Stratton M.D.   On: 05/15/2016 21:26   Dg Chest Port 1 View  Result Date: 05/15/2016 CLINICAL DATA:  Respiratory failure, ventilator dependent EXAM: PORTABLE CHEST 1 VIEW COMPARISON:  05/15/2016 at 4:46 a.m. FINDINGS: Endotracheal tube tip 2.5 cm above the carina. Left IJ line tip: SVC, with transverse orientation. Nasogastric tube extends into the stomach with some suggested in gaseous distention of the stomach. The patient is rotated to the right on today's radiograph, reducing diagnostic sensitivity and specificity. Mild cardiomegaly noted. Right pleural effusion with adjacent suspected atelectasis. Indistinct pulmonary vasculature compatible with pulmonary  venous hypertension. Reduced interstitial accentuation compared prior. IMPRESSION: 1. Cardiomegaly and pulmonary venous hypertension with reduced interstitial edema. 2. Stable moderate right pleural effusion with passive atelectasis. 3. Tubes and lines remain satisfactorily positioned. Electronically Signed   By: Gaylyn RongWalter  Liebkemann M.D.   On: 05/15/2016 19:42      ASSESSMENT and PLAN  Acute respiratory failure (HCC) Due to MODS. Does not meet SBT crtieria  Plan Full vent support Suspect he will need trach if he survives.  Shock circulatory (HCC) Currently due to massive severe RP hemorrhage Improving pressor needs 05/17/2016   Plan Pressor support for MAP > 65  Retroperitoneal hemorrhage  Recent Labs  05/16/16 1900 05/17/16 0352  HGB 7.5* 7.2*    Large on CT 05/13/16 -= 05/14/16 On 05/16/2016 - improved but still coagulopathic and stil evidence of slow bleeding No role for surgery per CCS 05/15/16 Heme consult noted 05/15/16, note reviewed.  Plan Support with blood products - 1unit prc, 1 unit ffp, 1 unit platelet Cbc q8h  Reduce stress dose steroids  Altered mental status RASS -3 with fentanyl gtt + prn. Can move left side on wua (baseline right weakness)  Plan fent gtt + sedation  Hemiparesis affecting right side as late effect of cerebrovascular accident [facility](HCC) Old baseline issue  On enteral nutrition restart tube feeds  given shock some better  Goals of care, counseling/discussion Full code. Family very upset at outcome - see 05/14/16 and 05/15/16 High mortality situation and defnitely high morbidity situation   Plan Ongoing goals chaplain consult to be offered to family if they are interested  Acute renal failure (ARF) (HCC) Oliguric/anuric 05/15/2016; started CRRT after HD cath placement 05/15/16 (Right IJ) Renal ok with dc bicarb  Plan crrt per renal abg at 3pm  Electrolyte imbalance Being managed by renal       FAMILY  - Updates: 05/17/2016  --> Family angry and argumentative. Demanding to speak to MD. MD aware.  - Inter-disciplinary family meet or Palliative Care meeting due by:  DAy 7. Current LOS is LOS 9 days  CODE STATUS    Code Status Orders        Start     Ordered   05/09/16 0131  Full code  Continuous     05/09/16 0133    Code Status History    Date Active Date Inactive Code Status Order ID Comments User Context   This patient has a current code status but no historical code status.        DISPO Keep in ICU   App cct 30 min    Brett Canales Minor ACNP [facility] PCCM Pager [phone] till 3 pm If no answer page [phone] 05/17/2016, 10:12 AM

## 2016-05-17 NOTE — Assessment & Plan Note (Addendum)
For acid prophylaxis

## 2016-05-17 NOTE — Assessment & Plan Note (Addendum)
Following LFt, normalizing.  ? 5cm liver lesion due to infarct / blood

## 2016-05-17 NOTE — Assessment & Plan Note (Addendum)
Synthroid per tube

## 2016-05-17 NOTE — Assessment & Plan Note (Addendum)
New 05/17/2016 after CRRT Improved with CRRT warmer 05/19/2016  Plan Off bair hugger as oof 05/20/16 CRRT warmer

## 2016-05-17 NOTE — Progress Notes (Signed)
Patient ID: Omar [name], male   DOB: 05/10/1945, 71 y.o.   MRN: [id number]  [facility] Progress Note   Assessment/ Plan:   1. Acute kidney injury: Data appears to be most consistent with ATN arising from hemorrhagic shock. Continue current CRRT prescription with the primary goal for clearance and attempting to keep him net fluid even to allow for administration of blood products as needed. Decreasing pressor requirement noted. Some improvement of urine output noted albeit remains in the oliguric range. 2. Hemorrhagic shock from acute retroperitoneal bleed: Decreased pressor support noted overnight likely as a result of blood products/increased oncotic pressure overnight. 3. Acute blood loss anemia secondary to retroperitoneal hemorrhage: Status post PRBC transfusion, continue to trend hemoglobin/hematocrit levels for indications of PRBCs. 4. Elevated liver enzymes: Secondary to what appears to be most consistent with shock liver from hemorrhagic shock- transaminases trending down.  Subjective:   Decreased pressor requirements noted overnight-now off Neo-Synephrine. Hemoglobin relatively stable overnight without need for additional PRBCs.    Objective:   BP 97/63   Pulse 77   Temp 97.3 F (36.3 C)   Resp 20   Ht 5' 6" (1.676 m)   Wt (!) 169.2 kg (373 lb)   SpO2 97%   BMI 60.20 kg/m   Intake/Output Summary (Last 24 hours) at 05/17/16 0800 Last data filed at 05/17/16 0700  Gross per 24 hour  Intake          3596.11 ml  Output             3815 ml  Net          -218.89 ml   Weight change: -1.814 kg (-4 lb)  Physical Exam: [id number], sedated, morbidly obese man CVS: Pulse regular rhythm, normal rate, S1 and S2 normal Resp: Diminished breath sounds bilaterally-no distinct rales or rhonchi Abd: Soft, obese, nontender Ext: 2+ upper and lower extremity edema  Imaging: Dg Chest Port 1 View  Result Date: 05/17/2016 CLINICAL DATA:  Intubation. EXAM: PORTABLE  CHEST 1 VIEW COMPARISON:  05/15/2016 . FINDINGS: Endotracheal tube, NG tube, left IJ line, right IJ line stable position. Cardiomegaly with pulmonary vascular congestion and bilateral pulmonary infiltrates consistent pulmonary edema. Small bilateral pleural effusions. Similar findings noted on prior exam. No pneumothorax. Surgical clips right neck. IMPRESSION: 1. Lines and tubes in stable position. 2. Congestive heart failure bilateral pulmonary edema and bilateral pleural effusions, no change prior exam. Electronically Signed   By: Marcello Moores  Register   On: 05/17/2016 07:42   Dg Chest Port 1 View  Result Date: 05/15/2016 CLINICAL DATA:  71 y/o  M; central line placement. EXAM: PORTABLE CHEST 1 VIEW COMPARISON:  05/15/2016 CT of the chest. FINDINGS: Stable endotracheal tube, left central venous catheter, and enteric tube. Interval placement of a right central venous catheter with tip projecting over upper SVC. Surgical clips in region of right thyroid bed. Stable cardiac silhouette given projection and technique. Stable hazy opacifications of the lungs, left pleural effusion, and associated right basilar opacity. IMPRESSION: 1. Interval placement of right central venous catheter with tip projecting over upper SVC. Other lines and tubes are stable. 2. Stable right pleural effusion and bilateral airspace opacities. Electronically Signed   By: Kristine Garbe M.D.   On: 05/15/2016 21:26   Dg Chest Port 1 View  Result Date: 05/15/2016 CLINICAL DATA:  Respiratory failure, ventilator dependent EXAM: PORTABLE CHEST 1 VIEW COMPARISON:  05/15/2016 at 4:46 a.m. FINDINGS: Endotracheal tube tip 2.5 cm above the carina. Left  IJ line tip: SVC, with transverse orientation. Nasogastric tube extends into the stomach with some suggested in gaseous distention of the stomach. The patient is rotated to the right on today's radiograph, reducing diagnostic sensitivity and specificity. Mild cardiomegaly noted. Right pleural  effusion with adjacent suspected atelectasis. Indistinct pulmonary vasculature compatible with pulmonary venous hypertension. Reduced interstitial accentuation compared prior. IMPRESSION: 1. Cardiomegaly and pulmonary venous hypertension with reduced interstitial edema. 2. Stable moderate right pleural effusion with passive atelectasis. 3. Tubes and lines remain satisfactorily positioned. Electronically Signed   By: Van Clines M.D.   On: 05/15/2016 19:42    Labs: BMET  Recent Labs Lab 05/12/16 0320 05/13/16 0427 05/14/16 0439 05/14/16 1257 05/14/16 1711 05/15/16 0340 05/16/16 0359 05/16/16 1520 05/17/16 0353  NA 143 146* 144  --   --  142 142 139 138  K 4.0 3.9 4.4  --   --  5.2* 4.4 4.3 4.3  CL 112* 107 109  --   --  108 106 102 103  CO2 _0 --   --  18* _1 GLUCOSE 85 98 67  --   --  127* 179* 136* 148*  BUN 30* 24* 35*  --   --  46* 45* 36* 33*  CREATININE 1.15 1.09 2.63*  --   --  3.53* 3.03* 2.33* 1.99*  CALCIUM 8.4* 8.5* 8.3*  --   --  8.0* 7.9* 8.0* 8.1*  PHOS  --   --  5.2* 5.9* 6.1* 6.1* 3.7 2.7 2.0*   CBC  Recent Labs Lab 05/15/16 0321  05/15/16 1523  05/16/16 0359 05/16/16 1216 05/16/16 1900 05/17/16 0352  WBC 19.4*  < > 19.2*  < > 12.6* 11.6* 12.8* 11.8*  NEUTROABS 16.5*  --  17.1*  --  11.4*  --   --  10.5*  HGB 7.5*  < > 8.0*  < > 7.0* 7.2* 7.5* 7.2*  HCT 23.6*  < > 25.1*  < > 21.4* 22.1* 23.0* 22.0*  MCV 95.5  < > 94.4  < > 94.7 92.5 92.4 92.4  PLT 107*  < > 140*  < > 97* 115* 113* 96*  < > = values in this interval not displayed.  Medications:    . cefTRIAXone (ROCEPHIN)  IV  2 g Intravenous Q24H  . chlorhexidine gluconate (MEDLINE KIT)  15 mL Mouth Rinse BID  . Chlorhexidine Gluconate Cloth  6 each Topical Daily  . feeding supplement (PRO-STAT SUGAR FREE 64)  30 mL Per Tube QID  . fentaNYL (SUBLIMAZE) injection  50 mcg Intravenous Once  . hydrocortisone sod succinate (SOLU-CORTEF) inj  50 mg Intravenous Daily  . insulin aspart   0-9 Units Subcutaneous Q4H  . levothyroxine  62.5 mcg Intravenous Daily  . mouth rinse  15 mL Mouth Rinse QID  . metoprolol  10 mg Intravenous Q6H  . pantoprazole (PROTONIX) IV  40 mg Intravenous Q24H   Elmarie Shiley, MD 05/17/2016, 8:00 AM

## 2016-05-17 NOTE — Assessment & Plan Note (Addendum)
No heparin or anticoag due to RP bleed Never any heeparin due to family reported hx of unique heparin sensitivity Continue SCD's

## 2016-05-18 ENCOUNTER — Inpatient Hospital Stay (HOSPITAL_COMMUNITY): Payer: Medicare Other

## 2016-05-18 LAB — CBC WITH DIFFERENTIAL/PLATELET
Basophils Absolute: 0 10*3/uL (ref 0.0–0.1)
Basophils Relative: 0 %
EOS PCT: 0 %
Eosinophils Absolute: 0 10*3/uL (ref 0.0–0.7)
HCT: 23.9 % — ABNORMAL LOW (ref 39.0–52.0)
Hemoglobin: 7.7 g/dL — ABNORMAL LOW (ref 13.0–17.0)
Lymphocytes Relative: 5 %
Lymphs Abs: 0.7 10*3/uL (ref 0.7–4.0)
MCH: 30.8 pg (ref 26.0–34.0)
MCHC: 32.2 g/dL (ref 30.0–36.0)
MCV: 95.6 fL (ref 78.0–100.0)
MONOS PCT: 8 %
Monocytes Absolute: 1.2 10*3/uL — ABNORMAL HIGH (ref 0.1–1.0)
NEUTROS ABS: 12.6 10*3/uL — AB (ref 1.7–7.7)
Neutrophils Relative %: 87 %
PLATELETS: 78 10*3/uL — AB (ref 150–400)
RBC: 2.5 MIL/uL — AB (ref 4.22–5.81)
RDW: 17.9 % — ABNORMAL HIGH (ref 11.5–15.5)
WBC: 14.5 10*3/uL — AB (ref 4.0–10.5)

## 2016-05-18 LAB — CBC
HCT: 25.5 % — ABNORMAL LOW (ref 39.0–52.0)
HEMOGLOBIN: 7.9 g/dL — AB (ref 13.0–17.0)
MCH: 29.7 pg (ref 26.0–34.0)
MCHC: 31 g/dL (ref 30.0–36.0)
MCV: 95.9 fL (ref 78.0–100.0)
Platelets: 90 10*3/uL — ABNORMAL LOW (ref 150–400)
RBC: 2.66 MIL/uL — ABNORMAL LOW (ref 4.22–5.81)
RDW: 17.9 % — ABNORMAL HIGH (ref 11.5–15.5)
WBC: 18.7 10*3/uL — ABNORMAL HIGH (ref 4.0–10.5)

## 2016-05-18 LAB — GLUCOSE, CAPILLARY
GLUCOSE-CAPILLARY: 125 mg/dL — AB (ref 65–99)
GLUCOSE-CAPILLARY: 139 mg/dL — AB (ref 65–99)
Glucose-Capillary: 122 mg/dL — ABNORMAL HIGH (ref 65–99)
Glucose-Capillary: 144 mg/dL — ABNORMAL HIGH (ref 65–99)
Glucose-Capillary: 162 mg/dL — ABNORMAL HIGH (ref 65–99)
Glucose-Capillary: 173 mg/dL — ABNORMAL HIGH (ref 65–99)

## 2016-05-18 LAB — RENAL FUNCTION PANEL
ANION GAP: 8 (ref 5–15)
Albumin: 2.7 g/dL — ABNORMAL LOW (ref 3.5–5.0)
Albumin: 2.7 g/dL — ABNORMAL LOW (ref 3.5–5.0)
Anion gap: 6 (ref 5–15)
BUN: 33 mg/dL — AB (ref 6–20)
BUN: 35 mg/dL — ABNORMAL HIGH (ref 6–20)
CHLORIDE: 102 mmol/L (ref 101–111)
CHLORIDE: 101 mmol/L (ref 101–111)
CO2: 28 mmol/L (ref 22–32)
CO2: 28 mmol/L (ref 22–32)
CREATININE: 1.86 mg/dL — AB (ref 0.61–1.24)
CREATININE: 1.82 mg/dL — AB (ref 0.61–1.24)
Calcium: 8 mg/dL — ABNORMAL LOW (ref 8.9–10.3)
Calcium: 7.9 mg/dL — ABNORMAL LOW (ref 8.9–10.3)
GFR calc Af Amer: 41 mL/min — ABNORMAL LOW (ref >60)
GFR calc non Af Amer: 35 mL/min — ABNORMAL LOW (ref >60)
GFR calc non Af Amer: 35 mL/min — ABNORMAL LOW (ref >60)
GFR, EST AFRICAN AMERICAN: 40 mL/min — AB (ref >60)
GLUCOSE: 132 mg/dL — AB (ref 65–99)
Glucose, Bld: 162 mg/dL — ABNORMAL HIGH (ref 65–99)
POTASSIUM: 3.9 mmol/L (ref 3.5–5.1)
POTASSIUM: 3.7 mmol/L (ref 3.5–5.1)
Phosphorus: 1.6 mg/dL — ABNORMAL LOW (ref 2.5–4.6)
Phosphorus: 2.1 mg/dL — ABNORMAL LOW (ref 2.5–4.6)
Sodium: 136 mmol/L (ref 135–145)
Sodium: 137 mmol/L (ref 135–145)

## 2016-05-18 LAB — PROTIME-INR
INR: 1.52
Prothrombin Time: 18.5 seconds — ABNORMAL HIGH (ref 11.4–15.2)

## 2016-05-18 LAB — APTT: aPTT: 36 seconds (ref 24–36)

## 2016-05-18 LAB — MAGNESIUM: Magnesium: 2.2 mg/dL (ref 1.7–2.4)

## 2016-05-18 MED ORDER — SODIUM PHOSPHATES 45 MMOLE/15ML IV SOLN
10.0000 mmol | Freq: Once | INTRAVENOUS | Status: AC
  Administered 2016-05-18: 10 mmol via INTRAVENOUS
  Filled 2016-05-18: qty 3.33

## 2016-05-18 MED ORDER — POLYETHYLENE GLYCOL 3350 17 G PO PACK
17.0000 g | PACK | Freq: Every day | ORAL | Status: DC
  Administered 2016-05-18 – 2016-06-05 (×13): 17 g via ORAL
  Filled 2016-05-18 (×19): qty 1

## 2016-05-18 NOTE — Progress Notes (Signed)
PCCM Progress Note  Admission date: 05/25/2016 Referring provider: Dr. Clarene Duke, [facility] ER  CC: altered mental status  HPI: 71 y/o male from [facility] admitted 3/5 with altered mental status with aspiration, sepsis, hypoxic/hypercapnic respiratory failure.     has a past medical history of Asthma; Atrial fibrillation (HCC); Gout; Hypertension; Morbid obesity (HCC); Multinodular goiter; Osteoarthrosis and allied disorders; Other and unspecified hyperlipidemia; Stroke [facility]); Thrombocytopenia (HCC); and Vitamin D deficiency.    Antibiotics: Zosyn 3/06 >> 3/08 Rocephin (H.Flu PNA) 3/08 >>  Cultures: Urine 3/05 >> negative Blood 3/06 >>   Lines/tubes: ETT 3/05 >> 3/8 Lt IJ CVL 3/07 >>   Events: 3/05  Transfer to [facility] Sputum 3/06 >> Haemophilus influenzae (beta lactamase positive) Echo 3/06 >> EF 65 to 70%, mod LVH, mod TR, PAS 39 mmHg CT head 3/7 >> no acute abnormality   3/08  extubated off pressors. No longer meeting sirs criteria  3/10  Reintubated, concern for bleed with drop in Hgb on heparin gtt, resp distress, AF w RVR.   CT ABD/Pelvis 3/10 >> large right retroperitoneal hematoma, small bilateral pleural effusions with extensive consolidation/atelectasis 3/11  Received 4 FFP overnight, 2 units PRBC  3/11  Remains on high dose vasopressors, CT c/w retroperitoneal bleed  RN reports hypotension on levo + neo.  FFP infusing.   3/12 - max pressors. Shock liver. Deeply sedated. S/p 4 units prbc and 4 unit ffp as of last night -> got 1 unit prbc, platlelet   And FFP x 5 -> HD cath placed . Famiily very angry at outcome of RP hemorrhage. Reports he is ssensitive to heparin based on 2013 experience. CCS -> no ro9le for surgery. Heme consult ->  Done. CRRT started  3/13 - events from yesterday noted. Down on vasopressor need. Down to levophed 18, neo 50, and vasopresson 0.03. Ongoing CRRT. On 200 fentantyl mcg and RN reports responsiveness on left. Shock liver better. Renal rec dc bic gtt. S/p  PRBC, FFP and platlets   3/14 - off neo, still on levophed 14 and shock dose vasop. Improved ur op but still in oliguric bleed. On Lawyer  . Shock liver better. Daughte at bedside: still very upset that patient was given IV heparin gtt    SUBJECTIVE/OVERNIGHT/INTERVAL HX 3/15 - still on levophed 18 and shock vaso. On even CRRT. 50% fio2. Bair hugger +. Oliguria +. Ur Op dropping. Tolerating TF. SEdated with fent gtt. Moves left per RN  Vital signs: BP (!) 86/61   Pulse 80   Temp 97.9 F (36.6 C)   Resp 20   Ht 5\' 6"  (1.676 m)   Wt (!) 166 kg (366 lb)   SpO2 99%   BMI 59.07 kg/m   Intake/output:  Intake/Output Summary (Last 24 hours) at 05/18/16 1040 Last data filed at 05/18/16 1000  Gross per 24 hour  Intake          2699.07 ml  Output             2684 ml  Net            15.07 ml   I/O last 3 completed shifts: In: 4045.4 [I.V.:1740.1; Other:165.3; NG/GT:2030; IV Piggyback:110] Out: 3944 [Urine:117; Other:3827]  EXAM  General Appearance:    Looks criticall ill OBESE - yes  Head:    Normocephalic, without obvious abnormality, atraumatic  Eyes:    PERRL - yes, conjunctiva/corneas - clear      Ears:    Normal external ear canals, both  ears  Nose:   NG tube - no  Throat:  ETT TUBE - yes , OG tube - yes  Neck:   Supple,  No enlargement/tenderness/nodules     Lungs:     Clear to auscultation bilaterally, Ventilator   Synchrony - yes  Chest wall:    No deformity  Heart:    S1 and S2 normal, no murmur, CVP - na.  Pressors - yes  Abdomen:     Soft, no masses, no organomegaly  Genitalia:    Not done  Rectal:   not done  Extremities:   Extremities- edema +     Skin:   Intact in exposed areas .      Neurologic:   Sedation - fent 100 -> RASS - -2 . Moves all 4s - moves [id number]. CAM-ICU - nott ested . Orientation - nodded yes to bein ok       LABS  PULMONARY  Recent Labs Lab 05/13/16 1211  05/14/16 0727 05/14/16 1044 05/15/16 0333 05/15/16 1522 05/16/16 0515  05/16/16 1500 05/17/16 0251  PHART 7.318*  < > 7.288* 7.252* 7.283* 7.317* 7.292* 7.373 7.476*  PCO2ART 53.8*  < > 41.1 44.1 40.6 44.0 60.8* 49.0* 36.4  PO2ART 89.0  < > 72.0* 84.0 72.0* 77.2* 71.7* 72.2* 57.0*  HCO3 27.6  < > 19.7* 19.3* 19.3* 21.9 28.5* 27.8 27.0  TCO2 29  --  21 21 21   --   --   --  28  O2SAT 96.0  < > 92.0 94.0 93.0 94.4 92.8 94.9 92.0  < > = values in this interval not displayed.  CBC  Recent Labs Lab 05/17/16 1139 05/17/16 1925 05/18/16 0350  HGB 7.7* 7.4* 7.7*  HCT 23.2* 23.1* 23.9*  WBC 12.9* 13.3* 14.5*  PLT 79* 81* 78*    COAGULATION  Recent Labs Lab 05/15/16 0340 05/15/16 2012 05/16/16 0359 05/17/16 0352 05/18/16 0350  INR 3.93 2.75 2.21 1.78 1.52    CARDIAC   Recent Labs Lab 05/15/16 1030 05/15/16 2012 05/16/16 0230  TROPONINI 0.83* 0.81* 0.48*   No results for input(s): PROBNP in the last 168 hours.   CHEMISTRY  Recent Labs Lab 05/14/16 1711 05/15/16 0340 05/16/16 0359 05/16/16 1520 05/17/16 0352 05/17/16 0353 05/17/16 1627 05/18/16 0350  NA  --  142 142 139  --  138 138 136  K  --  5.2* 4.4 4.3  --  4.3 4.1 3.9  CL  --  108 106 102  --  103 104 102  CO2  --  18* 29 27  --  27 26 28   GLUCOSE  --  127* 179* 136*  --  148* 144* 132*  BUN  --  46* 45* 36*  --  33* 33* 33*  CREATININE  --  3.53* 3.03* 2.33*  --  1.99* 2.02* 1.86*  CALCIUM  --  8.0* 7.9* 8.0*  --  8.1* 7.9* 8.0*  MG 1.7 2.0 2.0  --  2.1  --   --  2.2  PHOS 6.1* 6.1* 3.7 2.7  --  2.0* 1.8* 1.6*   Estimated Creatinine Clearance: 53.2 mL/min (A) (by C-G formula based on SCr of 1.86 mg/dL (H)).   LIVER  Recent Labs Lab 05/15/16 0340 05/15/16 2012 05/16/16 0359 05/16/16 1520 05/17/16 0352 05/17/16 0353 05/17/16 1627 05/18/16 0350  AST 3,667*  --  5,4091,884*  --  1,041*  --   --   --   ALT 2,019*  --  1,414*  --  1,106*  --   --   --   ALKPHOS 104  --  118  --  133*  --   --   --   BILITOT 5.0*  --  5.5*  --  5.8*  --   --   --   PROT 6.9  --   7.4  --  6.6  --   --   --   ALBUMIN 2.9*  --  3.2*  3.1* 3.0* 2.8* 2.8* 2.7* 2.7*  INR 3.93 2.75 2.21  --  1.78  --   --  1.52     INFECTIOUS  Recent Labs Lab 05/13/16 0427  05/15/16 1030 05/16/16 0400 05/16/16 2329  LATICACIDVEN  --   < > 4.6* 2.0* 1.9  PROCALCITON 0.53  --   --   --   --   < > = values in this interval not displayed.   ENDOCRINE CBG (last 3)   Recent Labs  05/18/16 0002 05/18/16 0344 05/18/16 0745  GLUCAP 135* 122* 125*         IMAGING x48h  - image(s) personally visualized  -   highlighted in bold Dg Chest Port 1 View  Result Date: 05/18/2016 CLINICAL DATA:  Respiratory failure . EXAM: PORTABLE CHEST 1 VIEW COMPARISON:  05/17/2016 . FINDINGS: Endotracheal tube, NG tube, bilateral IJ lines in stable position. Cardiomegaly with pulmonary vascular congestion and bilateral interstitial prominence. Bilateral pleural effusions, right side greater left. Findings consistent with CHF. No interim improvement. No pneumothorax. Surgical clips in the right neck. IMPRESSION: 1. Lines and tubes in stable position. 2. Congestive heart failure with bilateral pulmonary edema and bilateral pleural effusions without significant improvement. Electronically Signed   By: Maisie Fus  Register   On: 05/18/2016 06:36   Dg Chest Port 1 View  Result Date: 05/17/2016 CLINICAL DATA:  Intubation. EXAM: PORTABLE CHEST 1 VIEW COMPARISON:  05/15/2016 . FINDINGS: Endotracheal tube, NG tube, left IJ line, right IJ line stable position. Cardiomegaly with pulmonary vascular congestion and bilateral pulmonary infiltrates consistent pulmonary edema. Small bilateral pleural effusions. Similar findings noted on prior exam. No pneumothorax. Surgical clips right neck. IMPRESSION: 1. Lines and tubes in stable position. 2. Congestive heart failure bilateral pulmonary edema and bilateral pleural effusions, no change prior exam. Electronically Signed   By: Maisie Fus  Register   On: 05/17/2016 07:42        ASSESSMENT and PLAN  Acute respiratory failure (HCC) Due to MODS. Does not meet SBT crtieria  Plan Full vent support  Shock circulatory (HCC) Currently due to massive severe RP hemorrhage Improving pressor needs 05/17/2016 but slight worse 05/18/16 with levophed need.  No evidence of active bleed   Plan Pressor support for MAP > 65 Dc lopressor order  Retroperitoneal hemorrhage Large on CT 05/13/16 -= 05/14/16 On 05/16/2016 - improved but still coagulopathic and stil evidence of slow bleeding No role for surgery per CCS 05/15/16 Heme consult noted 05/15/16 - their findings note   Plan Support with blood products as needed Cbc q8h -. Wean to q12h stress dose steroids - remain off and monitor  Altered mental status RASS -3 with fentanyl gtt + prn. Can move left side on wua (baseline right weakness) Did nod appropriately 05/18/2016   Plan fent gtt + sedation  Hemiparesis affecting right side as late effect of cerebrovascular accident [facility]) Old baseline issue  On enteral nutrition Restarted  tube feeds 05/16/16 and tolerating it well 05/17/2016  Start miralax 05/18/2016    Goals of care,  counseling/discussion Full code. Family very upset at outcome - see 05/14/16 and 05/15/16 High mortality situation and defnitely high morbidity situation  On 05/17/16 - daughter at bedside: very upset that Iv Heparin gtt was given. Says that in 2013 for A Fib stroke admit got IV heparin gtt and bled around heart. Upset to heart hat this is not in record because Rx was [facility]. She says other anticoagulants including coumadin fine but not IV heparing ttt. Reports that she informed both at [facility] and [facility] to staff that patient should never get IV heparin gttt (she did this a priori per her). Upset that we did not take this into account and list in allergy list and still gave IV heparin gtt. REports that staff used words like "Trial and Error".    Plan Ongoing goals chaplain  consult to be offered to family if they are interested Full code  Acute renal failure (ARF) (HCC) Oliguric/anuric 05/15/2016; started CRRT after HD cath placement 05/15/16 (Right IJ) Ur OP dropping again  Plan crrt per renal; keeping even   Electrolyte imbalance Phos is low  Plan Replete phos conservatively  Shock liver Since RP bleed. Improving 05/17/2016  Plan monitor  Hypothermia New 05/17/2016 after CRRT  Plan bair hugger  Paroxysmal atrial fibrillation (HCC) In and out of slow A Fib 05/17/2016  Plan monitor  Hypothyroidism contnue synthroid; changed to po 05/17/2016   Current use of proton pump inhibitor cotnnue for sup  Contraindication to deep vein thrombosis (DVT) prophylaxis No heparin or anticoag due to RP bleed Never any heeparin due to family reported hx of unique heparin sensitivity  Plan scd      FAMILY  - Updates: 05/18/2016 --> none at bedside . RN updated them on phone and reprots they seemed less angry  - Inter-disciplinary family meet or Palliative Care meeting due by:  DAy 7. Current LOS is LOS 10 days  CODE STATUS    Code Status Orders        Start     Ordered   05/09/16 0131  Full code  Continuous     05/09/16 0133    Code Status History    Date Active Date Inactive Code Status Order ID Comments User Context   This patient has a current code status but no historical code status.        DISPO Keep in ICU      The patient is critically ill with multiple organ systems failure and requires high complexity decision making for assessment and support, frequent evaluation and titration of therapies, application of advanced monitoring technologies and extensive interpretation of multiple databases.   Critical Care Time devoted to patient care services described in this note is  30  Minutes. This time reflects time of care of this signee Dr Kalman Shan. This critical care time does not reflect procedure time, or teaching  time or supervisory time of PA/NP/Med student/Med Resident etc but could involve care discussion time    Dr. Kalman Shan, M.D., [facility].C.P Pulmonary and Critical Care Medicine Staff Physician [facility] [place] [facility] Pager: [phone], If no answer or between  15:00h - 7:00h: call [phone]  05/18/2016 10:40 AM

## 2016-05-18 NOTE — Care Management Note (Signed)
Case Management Note  Patient Details  Name: Omar [name] MRN: [id number] Date of Birth: 22-Feb-1946  Subjective/Objective: Pt admitted for acute resp failure - on ventilator                   Action/Plan:  PTA from home with daughter and grandkids.  Pt is wheelchair bound at home.  CM will continue to follow for discharge needs   Expected Discharge Date:                  Expected Discharge Plan:  Home/Self Care  In-House Referral:     Discharge planning Services  CM Consult  Post Acute Care Choice:    Choice offered to:     DME Arranged:    DME Agency:     HH Arranged:    HH Agency:     Status of Service:  In process, will continue to follow  If discussed at Long Length of Stay Meetings, dates discussed:    Additional Comments: 05/18/2016 Discussed in LOS 05/18/16 - remains appropriate for continued stay.  Remains on ventilator Cherylann ParrClaxton, Hagen Tidd S, RN 05/18/2016, 1:52 PM

## 2016-05-18 NOTE — Progress Notes (Signed)
Patient ID: IBAN UTZ, male   DOB: 1945-05-05, 71 y.o.   MRN: [id number]  [facility] Progress Note   Assessment/ Plan:   1. Acute kidney injury: Data appears to be most consistent with ATN arising from hemorrhagic shock. Continue current CRRT prescription with the primary goal for clearance and attempting to keep him net fluid even to allow for administration of blood products as needed. Urine output in anuric range-plan to begin ultrafiltration when on decreased pressor doses. 2. Hemorrhagic shock from acute retroperitoneal bleed: Decreased pressor support noted overnight likely as a result of blood products/increased oncotic pressure overnight. 3. Acute blood loss anemia secondary to retroperitoneal hemorrhage: Hemoglobin/hematocrit stable overnight without need for supplemental PRBC transfusions. 4. Elevated liver enzymes: Secondary to what appears to be most consistent with shock liver from hemorrhagic shock- transaminases trending down.  Subjective:   No acute events overnight-attempted to wean off of pressors    Objective:   BP (!) 89/62   Pulse 70   Temp 97.9 F (36.6 C)   Resp 20   Ht _0  (1.676 m)   Wt (!) 166 kg (366 lb)   SpO2 100%   BMI 59.07 kg/m   Intake/Output Summary (Last 24 hours) at 05/18/16 0741 Last data filed at 05/18/16 0700  Gross per 24 hour  Intake          2781.47 ml  Output             2591 ml  Net           190.47 ml   Weight change: -3.175 kg (-7 lb)  Physical Exam: [id number], sedated, morbidly obese man CVS: Pulse regular rhythm, normal rate, S1 and S2 normal Resp: Diminished breath sounds bilaterally-no distinct rales or rhonchi Abd: Soft, obese, nontender Ext: 2+ upper and lower extremity edema  Imaging: Dg Chest Port 1 View  Result Date: 05/18/2016 CLINICAL DATA:  Respiratory failure . EXAM: PORTABLE CHEST 1 VIEW COMPARISON:  05/17/2016 . FINDINGS: Endotracheal tube, NG tube, bilateral IJ lines in stable  position. Cardiomegaly with pulmonary vascular congestion and bilateral interstitial prominence. Bilateral pleural effusions, right side greater left. Findings consistent with CHF. No interim improvement. No pneumothorax. Surgical clips in the right neck. IMPRESSION: 1. Lines and tubes in stable position. 2. Congestive heart failure with bilateral pulmonary edema and bilateral pleural effusions without significant improvement. Electronically Signed   By: Marcello Moores  Register   On: 05/18/2016 06:36   Dg Chest Port 1 View  Result Date: 05/17/2016 CLINICAL DATA:  Intubation. EXAM: PORTABLE CHEST 1 VIEW COMPARISON:  05/15/2016 . FINDINGS: Endotracheal tube, NG tube, left IJ line, right IJ line stable position. Cardiomegaly with pulmonary vascular congestion and bilateral pulmonary infiltrates consistent pulmonary edema. Small bilateral pleural effusions. Similar findings noted on prior exam. No pneumothorax. Surgical clips right neck. IMPRESSION: 1. Lines and tubes in stable position. 2. Congestive heart failure bilateral pulmonary edema and bilateral pleural effusions, no change prior exam. Electronically Signed   By: [place]   On: 05/17/2016 07:42    Labs: BMET  Recent Labs Lab 05/14/16 7846  05/14/16 1711 05/15/16 0340 05/16/16 0359 05/16/16 1520 05/17/16 0353 05/17/16 1627 05/18/16 0350  NA 144  --   --  142 142 139 138 138 136  K 4.4  --   --  5.2* 4.4 4.3 4.3 4.1 3.9  CL 109  --   --  108 106 102 103 104 102  CO2 22  --   --  18* _0 GLUCOSE 67  --   --  127* 179* 136* 148* 144* 132*  BUN 35*  --   --  46* 45* 36* 33* 33* 33*  CREATININE 2.63*  --   --  3.53* 3.03* 2.33* 1.99* 2.02* 1.86*  CALCIUM 8.3*  --   --  8.0* 7.9* 8.0* 8.1* 7.9* 8.0*  PHOS 5.2*  < > 6.1* 6.1* 3.7 2.7 2.0* 1.8* 1.6*  < > = values in this interval not displayed. CBC  Recent Labs Lab 05/15/16 1523  05/16/16 0359  05/17/16 0352 05/17/16 1139 05/17/16 1925 05/18/16 0350  WBC 19.2*  < >  12.6*  < > 11.8* 12.9* 13.3* 14.5*  NEUTROABS 17.1*  --  11.4*  --  10.5*  --   --  12.6*  HGB 8.0*  < > 7.0*  < > 7.2* 7.7* 7.4* 7.7*  HCT 25.1*  < > 21.4*  < > 22.0* 23.2* 23.1* 23.9*  MCV 94.4  < > 94.7  < > 92.4 93.2 93.9 95.6  PLT 140*  < > 97*  < > 96* 79* 81* 78*  < > = values in this interval not displayed.  Medications:    . chlorhexidine gluconate (MEDLINE KIT)  15 mL Mouth Rinse BID  . Chlorhexidine Gluconate Cloth  6 each Topical Daily  . feeding supplement (PRO-STAT SUGAR FREE 64)  30 mL Per Tube QID  . fentaNYL (SUBLIMAZE) injection  50 mcg Intravenous Once  . hydrocortisone sod succinate (SOLU-CORTEF) inj  50 mg Intravenous Daily  . insulin aspart  0-9 Units Subcutaneous Q4H  . levothyroxine  125 mcg Oral QAC breakfast  . mouth rinse  15 mL Mouth Rinse QID  . metoprolol  10 mg Intravenous Q6H  . pantoprazole sodium  40 mg Per Tube Daily   Elmarie Shiley, MD 05/18/2016, 7:41 AM

## 2016-05-19 ENCOUNTER — Inpatient Hospital Stay (HOSPITAL_COMMUNITY): Payer: Medicare Other

## 2016-05-19 ENCOUNTER — Encounter: Payer: Self-pay | Admitting: Family Medicine

## 2016-05-19 ENCOUNTER — Telehealth: Payer: Self-pay | Admitting: Family Medicine

## 2016-05-19 ENCOUNTER — Encounter (INDEPENDENT_AMBULATORY_CARE_PROVIDER_SITE_OTHER): Payer: Self-pay

## 2016-05-19 LAB — CBC WITH DIFFERENTIAL/PLATELET
Basophils Absolute: 0.4 10*3/uL — ABNORMAL HIGH (ref 0.0–0.1)
Basophils Relative: 2 %
EOS ABS: 0 10*3/uL (ref 0.0–0.7)
EOS PCT: 0 %
HEMATOCRIT: 25.2 % — AB (ref 39.0–52.0)
Hemoglobin: 8 g/dL — ABNORMAL LOW (ref 13.0–17.0)
Lymphocytes Relative: 5 %
Lymphs Abs: 0.9 10*3/uL (ref 0.7–4.0)
MCH: 30.9 pg (ref 26.0–34.0)
MCHC: 31.7 g/dL (ref 30.0–36.0)
MCV: 97.3 fL (ref 78.0–100.0)
MONO ABS: 2 10*3/uL — AB (ref 0.1–1.0)
Monocytes Relative: 11 %
NEUTROS PCT: 82 %
Neutro Abs: 14.8 10*3/uL — ABNORMAL HIGH (ref 1.7–7.7)
PLATELETS: 76 10*3/uL — AB (ref 150–400)
RBC: 2.59 MIL/uL — AB (ref 4.22–5.81)
RDW: 17.9 % — AB (ref 11.5–15.5)
WBC: 18.1 10*3/uL — AB (ref 4.0–10.5)

## 2016-05-19 LAB — BASIC METABOLIC PANEL
Anion gap: 8 (ref 5–15)
BUN: 35 mg/dL — ABNORMAL HIGH (ref 6–20)
CHLORIDE: 101 mmol/L (ref 101–111)
CO2: 28 mmol/L (ref 22–32)
Calcium: 8 mg/dL — ABNORMAL LOW (ref 8.9–10.3)
Creatinine, Ser: 1.71 mg/dL — ABNORMAL HIGH (ref 0.61–1.24)
GFR calc Af Amer: 44 mL/min — ABNORMAL LOW (ref >60)
GFR calc non Af Amer: 38 mL/min — ABNORMAL LOW (ref >60)
Glucose, Bld: 138 mg/dL — ABNORMAL HIGH (ref 65–99)
POTASSIUM: 4 mmol/L (ref 3.5–5.1)
SODIUM: 137 mmol/L (ref 135–145)

## 2016-05-19 LAB — MAGNESIUM: MAGNESIUM: 2.3 mg/dL (ref 1.7–2.4)

## 2016-05-19 LAB — RENAL FUNCTION PANEL
ALBUMIN: 2.5 g/dL — AB (ref 3.5–5.0)
ANION GAP: 5 (ref 5–15)
BUN: 33 mg/dL — ABNORMAL HIGH (ref 6–20)
CALCIUM: 7.9 mg/dL — AB (ref 8.9–10.3)
CO2: 27 mmol/L (ref 22–32)
CREATININE: 1.6 mg/dL — AB (ref 0.61–1.24)
Chloride: 103 mmol/L (ref 101–111)
GFR calc non Af Amer: 41 mL/min — ABNORMAL LOW (ref >60)
GFR, EST AFRICAN AMERICAN: 48 mL/min — AB (ref >60)
GLUCOSE: 163 mg/dL — AB (ref 65–99)
PHOSPHORUS: 1.8 mg/dL — AB (ref 2.5–4.6)
Potassium: 4.1 mmol/L (ref 3.5–5.1)
SODIUM: 135 mmol/L (ref 135–145)

## 2016-05-19 LAB — GLUCOSE, CAPILLARY
GLUCOSE-CAPILLARY: 127 mg/dL — AB (ref 65–99)
GLUCOSE-CAPILLARY: 145 mg/dL — AB (ref 65–99)
GLUCOSE-CAPILLARY: 119 mg/dL — AB (ref 65–99)
Glucose-Capillary: 137 mg/dL — ABNORMAL HIGH (ref 65–99)
Glucose-Capillary: 149 mg/dL — ABNORMAL HIGH (ref 65–99)
Glucose-Capillary: 117 mg/dL — ABNORMAL HIGH (ref 65–99)

## 2016-05-19 LAB — LACTIC ACID, PLASMA: Lactic Acid, Venous: 1.4 mmol/L (ref 0.5–1.9)

## 2016-05-19 LAB — CBC
HCT: 25.7 % — ABNORMAL LOW (ref 39.0–52.0)
HEMOGLOBIN: 8 g/dL — AB (ref 13.0–17.0)
MCH: 30.5 pg (ref 26.0–34.0)
MCHC: 31.1 g/dL (ref 30.0–36.0)
MCV: 98.1 fL (ref 78.0–100.0)
PLATELETS: 72 10*3/uL — AB (ref 150–400)
RBC: 2.62 MIL/uL — AB (ref 4.22–5.81)
RDW: 18 % — ABNORMAL HIGH (ref 11.5–15.5)
WBC: 17.8 10*3/uL — ABNORMAL HIGH (ref 4.0–10.5)

## 2016-05-19 LAB — HEPATIC FUNCTION PANEL
ALBUMIN: 2.6 g/dL — AB (ref 3.5–5.0)
ALT: 769 U/L — ABNORMAL HIGH (ref 17–63)
AST: 367 U/L — ABNORMAL HIGH (ref 15–41)
Alkaline Phosphatase: 160 U/L — ABNORMAL HIGH (ref 38–126)
Bilirubin, Direct: 2.9 mg/dL — ABNORMAL HIGH (ref 0.1–0.5)
Indirect Bilirubin: 2.3 mg/dL — ABNORMAL HIGH (ref 0.3–0.9)
TOTAL PROTEIN: 6.7 g/dL (ref 6.5–8.1)
Total Bilirubin: 5.2 mg/dL — ABNORMAL HIGH (ref 0.3–1.2)

## 2016-05-19 LAB — PROTIME-INR
INR: 1.4
Prothrombin Time: 17.3 seconds — ABNORMAL HIGH (ref 11.4–15.2)

## 2016-05-19 MED ORDER — HYDROCORTISONE NA SUCCINATE PF 100 MG IJ SOLR
50.0000 mg | Freq: Three times a day (TID) | INTRAMUSCULAR | Status: DC
  Administered 2016-05-19 – 2016-05-22 (×10): 50 mg via INTRAVENOUS
  Filled 2016-05-19 (×5): qty 1
  Filled 2016-05-19: qty 2
  Filled 2016-05-19 (×4): qty 1

## 2016-05-19 NOTE — Progress Notes (Signed)
Patient ID: Omar [name], male   DOB: [birth year], 71 y.o.   MRN: [id number]  [facility] Progress Note   Assessment/ Plan:   1. Acute kidney injury: Data appears to be most consistent with ATN arising from hemorrhagic shock. He is anuric and without any renal recovery and significantly volume overloaded on physical exam. Continue current CRRT prescription and will begin attendance at ultrafiltration today with net -50 mL per hour (may transiently need to increase pressors to allow for this). 2. Hemorrhagic shock from acute retroperitoneal bleed: Some increased pressor requirements noted overnight. 3. Acute blood loss anemia secondary to retroperitoneal hemorrhage: Hemoglobin/hematocrit stable overnight without need for supplemental PRBC transfusions. 4. Elevated liver enzymes: Secondary to what appears to be most consistent with shock liver from hemorrhagic shock- transaminases trending down.  Subjective:   No acute events overnight- Some up titration of pressors noted overnight.    Objective:   BP (!) 86/50   Pulse 79   Temp 97.9 F (36.6 C)   Resp 20   Ht _0  (1.676 m)   Wt (!) 166.9 kg (368 lb)   SpO2 97%   BMI 59.40 kg/m   Intake/Output Summary (Last 24 hours) at 05/19/16 0746 Last data filed at 05/19/16 0700  Gross per 24 hour  Intake             2802 ml  Output             2691 ml  Net              111 ml   Weight change: 0.907 kg (2 lb)  Physical Exam: [id number], sedated, morbidly obese man CVS: Pulse regular rhythm, normal rate, S1 and S2 normal Resp: Diminished breath sounds bilaterally-no distinct rales or rhonchi Abd: Soft, obese, nontender Ext: 2-3+ upper and lower extremity edema  Imaging: Dg Chest Port 1 View  Result Date: 05/19/2016 CLINICAL DATA:  Intubation. EXAM: PORTABLE CHEST 1 VIEW COMPARISON:  05/18/2016. FINDINGS: Endotracheal tube, NG tube, bilateral IJ lines in stable position. Cardiomegaly with diffuse bilateral pulmonary  infiltrates consistent pulmonary edema. Bilateral pleural effusions, right side greater than left again noted. Chest is unchanged from prior exam. Surgical clips right neck . IMPRESSION: 1. Lines and tubes in stable position. 2. Congestive heart failure bilateral pulmonary edema and bilateral pleural effusions. Exam unchanged from prior exam. Electronically Signed   By: Marcello Moores  Register   On: 05/19/2016 07:15   Dg Chest Port 1 View  Result Date: 05/18/2016 CLINICAL DATA:  Respiratory failure . EXAM: PORTABLE CHEST 1 VIEW COMPARISON:  05/17/2016 . FINDINGS: Endotracheal tube, NG tube, bilateral IJ lines in stable position. Cardiomegaly with pulmonary vascular congestion and bilateral interstitial prominence. Bilateral pleural effusions, right side greater left. Findings consistent with CHF. No interim improvement. No pneumothorax. Surgical clips in the right neck. IMPRESSION: 1. Lines and tubes in stable position. 2. Congestive heart failure with bilateral pulmonary edema and bilateral pleural effusions without significant improvement. Electronically Signed   ByMarcello Moores  Register   On: 05/18/2016 06:36    Labs: BMET  Recent Labs Lab 05/15/16 0340 05/16/16 0359 05/16/16 1520 05/17/16 0353 05/17/16 1627 05/18/16 0350 05/18/16 1555 05/19/16 0330  NA 142 142 139 138 138 136 137 137  K 5.2* 4.4 4.3 4.3 4.1 3.9 3.7 4.0  CL 108 106 102 103 104 102 101 101  CO2 18* _1 GLUCOSE 127* 179* 136* 148* 144* 132* 162* 138*  BUN  46* 45* 36* 33* 33* 33* 35* 35*  CREATININE 3.53* 3.03* 2.33* 1.99* 2.02* 1.86* 1.82* 1.71*  CALCIUM 8.0* 7.9* 8.0* 8.1* 7.9* 8.0* 7.9* 8.0*  PHOS 6.1* 3.7 2.7 2.0* 1.8* 1.6* 2.1*  --    CBC  Recent Labs Lab 05/16/16 0359  05/17/16 0352  05/17/16 1925 05/18/16 0350 05/18/16 1555 05/19/16 0330  WBC 12.6*  < > 11.8*  < > 13.3* 14.5* 18.7* 18.1*  NEUTROABS 11.4*  --  10.5*  --   --  12.6*  --  14.8*  HGB 7.0*  < > 7.2*  < > 7.4* 7.7* 7.9* 8.0*  HCT  21.4*  < > 22.0*  < > 23.1* 23.9* 25.5* 25.2*  MCV 94.7  < > 92.4  < > 93.9 95.6 95.9 97.3  PLT 97*  < > 96*  < > 81* 78* 90* 76*  < > = values in this interval not displayed.  Medications:    . chlorhexidine gluconate (MEDLINE KIT)  15 mL Mouth Rinse BID  . Chlorhexidine Gluconate Cloth  6 each Topical Daily  . feeding supplement (PRO-STAT SUGAR FREE 64)  30 mL Per Tube QID  . insulin aspart  0-9 Units Subcutaneous Q4H  . levothyroxine  125 mcg Oral QAC breakfast  . mouth rinse  15 mL Mouth Rinse QID  . pantoprazole sodium  40 mg Per Tube Daily  . polyethylene glycol  17 g Oral Daily   Elmarie Shiley, MD 05/19/2016, 7:46 AM

## 2016-05-19 NOTE — Telephone Encounter (Signed)
AVS and MyChart letter printed.  Called and left message that it is ready for pickup / lat

## 2016-05-19 NOTE — Progress Notes (Signed)
eLink Physician-Brief Progress Note Patient Name: Omar FellingWilliam R [name] DOB: [birth year] MRN: [id number]   Date of Service  05/19/2016  HPI/Events of Note  Ileus - Residual on enteral nutrition = 2200 mL.   eICU Interventions  Will order: 1. Place gastric tube to LIS. 2. Increase 0.9 NaCl IV infusion to 50 mL/hour.     Intervention Category Major Interventions: Other:  [name],[name] 05/19/2016, 8:10 PM

## 2016-05-19 NOTE — Progress Notes (Signed)
PCCM Progress Note  Admission date: 05/22/2016 Referring provider: Dr. Clarene Duke, [facility]  CC: altered mental status  HPI: 71 y/o male from [facility] admitted 3/5 with altered mental status with aspiration, sepsis, hypoxic/hypercapnic respiratory failure.     has a past medical history of Asthma; Atrial fibrillation (HCC); Gout; Hypertension; Morbid obesity (HCC); Multinodular goiter; Osteoarthrosis and allied disorders; Other and unspecified hyperlipidemia; Stroke [facility]); Thrombocytopenia (HCC); and Vitamin D deficiency.    Antibiotics: Zosyn 3/06 >> 3/08 Rocephin (H.Flu PNA) 3/08 >>  Cultures: Urine 3/05 >> negative Blood 3/06 >>   Lines/tubes: ETT 3/05 >> 3/8 Lt IJ CVL 3/07 >>   Events: 3/05  Transfer to [facility] Sputum 3/06 >> Haemophilus influenzae (beta lactamase positive) Echo 3/06 >> EF 65 to 70%, mod LVH, mod TR, PAS 39 mmHg CT head 3/7 >> no acute abnormality   3/08  extubated off pressors. No longer meeting sirs criteria  3/10  Reintubated, concern for bleed with drop in Hgb on heparin gtt, resp distress, AF w RVR.   CT ABD/Pelvis 3/10 >> large right retroperitoneal hematoma, small bilateral pleural effusions with extensive consolidation/atelectasis 3/11  Received 4 FFP overnight, 2 units PRBC  3/11  Remains on high dose vasopressors, CT c/w retroperitoneal bleed  RN reports hypotension on levo + neo.  FFP infusing.   3/12 - max pressors. Shock liver. Deeply sedated. S/p 4 units prbc and 4 unit ffp as of last night -> got 1 unit prbc, platlelet   And FFP x 5 -> HD cath placed . Famiily very angry at outcome of RP hemorrhage. Reports he is ssensitive to heparin based on 2013 experience. CCS -> no ro9le for surgery. Heme consult ->  Done. CRRT started  3/13 - events from yesterday noted. Down on vasopressor need. Down to levophed 18, neo 50, and vasopresson 0.03. Ongoing CRRT. On 200 fentantyl mcg and RN reports responsiveness on left. Shock liver better. Renal rec dc bic gtt. S/p  PRBC, FFP and platlets  3/14 - off neo, still on levophed 14 and shock dose vasop. Improved ur op but still in oliguric bleed. On Lawyer  . Shock liver better. Daughte at bedside: still very upset that patient was given IV heparin gtt  3/15 - still on levophed 18 and shock vaso. On even CRRT. 50% fio2. Bair hugger +. Oliguria +. Ur Op dropping. Tolerating TF. SEdated with fent gtt. Moves left per RN   SUBJECTIVE/OVERNIGHT/INTERVAL HX 3/16 - inreased levoiphed need to , on vaso. Seems to corelated with hydrocort dc. CBC holding.  Anuric - makigg 30cc urine per shift. Renal planning to incrase volume removeal. Singificant 3rd spacing +++/. Tolerating TF. Lorre Nick - moves left side and bites tube. Off bair hugger.   Vital signs: BP (!) 97/56   Pulse 82   Temp 97.9 F (36.6 C)   Resp 20   Ht 5\' 6"  (1.676 m)   Wt (!) 166.9 kg (368 lb)   SpO2 99%   BMI 59.40 kg/m   Intake/output:  Intake/Output Summary (Last 24 hours) at 05/19/16 0948 Last data filed at 05/19/16 0900  Gross per 24 hour  Intake           2820.8 ml  Output             2748 ml  Net             72.8 ml   I/O last 3 completed shifts: In: 4099.8 [I.V.:1937.8; NG/GT:1910; IV Piggyback:252] Out: 6962 [Urine:53; Other:4014]  EXAM  General Appearance:    Looks criticall ill OBESE - yes  Head:    Normocephalic, without obvious abnormality, atraumatic  Eyes:    PERRL - yes, conjunctiva/corneas - clear      Ears:    Normal external ear canals, both ears  Nose:   NG tube - no  Throat:  ETT TUBE - yes , OG tube - yes  Neck:   Supple,  No enlargement/tenderness/nodules     Lungs:     Clear to auscultation bilaterally, Ventilator   Synchrony - yes  Chest wall:    No deformity  Heart:    S1 and S2 normal, no murmur, CVP - no.  Pressors - yes  Abdomen:     Soft, no masses, no organomegaly  Genitalia:    Not done  Rectal:   not done  Extremities:   Extremities- edema 3rd spacing +     Skin:   Intact in exposed  areas . Sacral area - no reports of decub     Neurologic:   Sedation - fent gtt -> RASS - -2/-3 . Moves all 4s - moves left. CAM-ICU - unable to test . Orientation - not oriented       LABS  PULMONARY  Recent Labs Lab 05/13/16 1211  05/14/16 0727 05/14/16 1044 05/15/16 0333 05/15/16 1522 05/16/16 0515 05/16/16 1500 05/17/16 0251  PHART 7.318*  < > 7.288* 7.252* 7.283* 7.317* 7.292* 7.373 7.476*  PCO2ART 53.8*  < > 41.1 44.1 40.6 44.0 60.8* 49.0* 36.4  PO2ART 89.0  < > 72.0* 84.0 72.0* 77.2* 71.7* 72.2* 57.0*  HCO3 27.6  < > 19.7* 19.3* 19.3* 21.9 28.5* 27.8 27.0  TCO2 29  --  21 21 21   --   --   --  28  O2SAT 96.0  < > 92.0 94.0 93.0 94.4 92.8 94.9 92.0  < > = values in this interval not displayed.  CBC  Recent Labs Lab 05/18/16 0350 05/18/16 1555 05/19/16 0330  HGB 7.7* 7.9* 8.0*  HCT 23.9* 25.5* 25.2*  WBC 14.5* 18.7* 18.1*  PLT 78* 90* 76*    COAGULATION  Recent Labs Lab 05/15/16 2012 05/16/16 0359 05/17/16 0352 05/18/16 0350 05/19/16 0330  INR 2.75 2.21 1.78 1.52 1.40    CARDIAC   Recent Labs Lab 05/15/16 1030 05/15/16 2012 05/16/16 0230  TROPONINI 0.83* 0.81* 0.48*   No results for input(s): PROBNP in the last 168 hours.   CHEMISTRY  Recent Labs Lab 05/15/16 0340 05/16/16 0359 05/16/16 1520 05/17/16 0352 05/17/16 0353 05/17/16 1627 05/18/16 0350 05/18/16 1555 05/19/16 0330  NA 142 142 139  --  138 138 136 137 137  K 5.2* 4.4 4.3  --  4.3 4.1 3.9 3.7 4.0  CL 108 106 102  --  103 104 102 101 101  CO2 18* 29 27  --  27 26 28 28 28   GLUCOSE 127* 179* 136*  --  148* 144* 132* 162* 138*  BUN 46* 45* 36*  --  33* 33* 33* 35* 35*  CREATININE 3.53* 3.03* 2.33*  --  1.99* 2.02* 1.86* 1.82* 1.71*  CALCIUM 8.0* 7.9* 8.0*  --  8.1* 7.9* 8.0* 7.9* 8.0*  MG 2.0 2.0  --  2.1  --   --  2.2  --  2.3  PHOS 6.1* 3.7 2.7  --  2.0* 1.8* 1.6* 2.1*  --    Estimated Creatinine Clearance: 58 mL/min (A) (by C-G formula based on SCr of 1.71 mg/dL  (  H)).   LIVER  Recent Labs Lab 05/15/16 0340 05/15/16 2012 05/16/16 0359  05/17/16 0352 05/17/16 0353 05/17/16 1627 05/18/16 0350 05/18/16 1555 05/19/16 0330  AST 3,667*  --  1,884*  --  1,041*  --   --   --   --  367*  ALT 2,019*  --  1,414*  --  1,106*  --   --   --   --  769*  ALKPHOS 104  --  118  --  133*  --   --   --   --  160*  BILITOT 5.0*  --  5.5*  --  5.8*  --   --   --   --  5.2*  PROT 6.9  --  7.4  --  6.6  --   --   --   --  6.7  ALBUMIN 2.9*  --  3.2*  3.1*  < > 2.8* 2.8* 2.7* 2.7* 2.7* 2.6*  INR 3.93 2.75 2.21  --  1.78  --   --  1.52  --  1.40  < > = values in this interval not displayed.   INFECTIOUS  Recent Labs Lab 05/13/16 0427  05/15/16 1030 05/16/16 0400 05/16/16 2329  LATICACIDVEN  --   < > 4.6* 2.0* 1.9  PROCALCITON 0.53  --   --   --   --   < > = values in this interval not displayed.   ENDOCRINE CBG (last 3)   Recent Labs  05/18/16 2338 05/19/16 0328 05/19/16 0719  GLUCAP 144* 127* 137*         IMAGING x48h  - image(s) personally visualized  -   highlighted in bold Dg Chest Port 1 View  Result Date: 05/19/2016 CLINICAL DATA:  Intubation. EXAM: PORTABLE CHEST 1 VIEW COMPARISON:  05/18/2016. FINDINGS: Endotracheal tube, NG tube, bilateral IJ lines in stable position. Cardiomegaly with diffuse bilateral pulmonary infiltrates consistent pulmonary edema. Bilateral pleural effusions, right side greater than left again noted. Chest is unchanged from prior exam. Surgical clips right neck . IMPRESSION: 1. Lines and tubes in stable position. 2. Congestive heart failure bilateral pulmonary edema and bilateral pleural effusions. Exam unchanged from prior exam. Electronically Signed   By: Maisie Fushomas  Register   On: 05/19/2016 07:15   Dg Chest Port 1 View  Result Date: 05/18/2016 CLINICAL DATA:  Respiratory failure . EXAM: PORTABLE CHEST 1 VIEW COMPARISON:  05/17/2016 . FINDINGS: Endotracheal tube, NG tube, bilateral IJ lines in stable  position. Cardiomegaly with pulmonary vascular congestion and bilateral interstitial prominence. Bilateral pleural effusions, right side greater left. Findings consistent with CHF. No interim improvement. No pneumothorax. Surgical clips in the right neck. IMPRESSION: 1. Lines and tubes in stable position. 2. Congestive heart failure with bilateral pulmonary edema and bilateral pleural effusions without significant improvement. Electronically Signed   By: Maisie Fushomas  Register   On: 05/18/2016 06:36       ASSESSMENT and PLAN  Acute respiratory failure (HCC) Still does not meett SBT criteria 05/19/2016  Plan Full vent support  Shock circulatory (HCC) Due to massive severe RP hemorrhage on 05/13/16 Improving pressor needs 05/17/2016 but slight worse 05/18/16 and even worse 05/19/2016   No evidence of active bleed Worsening shock 05/19/2016 correlates with dc hydrocort   Plan Pressor support for MAP > 65 Restart hydrocort 05/19/2016   Retroperitoneal hemorrhage Large on CT 05/13/16 -= 05/14/16 No role for surgery per CCS 05/15/16 Heme consult noted 05/15/16 - their findings note On 05/19/2016 - no  evidence of active bleed clinically   Plan Support with blood products as needed Cbc  q12h continue If worsening pressor needs despite hydrocort, reimage abdomen   Altered mental status RASS -3 with fentanyl gtt + prn. Can move left side on wua (baseline right weakness) Did nod appropriately 05/18/2016. Some agitation 05/19/2016    Plan fent gtt + sedation  Hemiparesis affecting right side as late effect of cerebrovascular accident [facility]) Old baseline issue  On enteral nutrition Restarted  tube feeds 05/16/16 and tolerating it well 05/17/2016  Start miralax 05/18/2016    Goals of care, counseling/discussion Full code. Family very upset at outcome - see 05/14/16 and 05/15/16 High mortality situation and defnitely high morbidity situation  On 05/17/16 - daughter at bedside: very upset that Iv  Heparin gtt was given. Says that in 2013 for A Fib stroke admit got IV heparin gtt and bled around heart. Upset to heart hat this is not in record because [facility] was [facility]. She says other anticoagulants including coumadin fine but not IV heparing ttt. Reports that she informed both at [facility] and [facility] to staff that patient should never get IV heparin gttt (she did this a priori per her). Upset that we did not take this into account and list in allergy list and still gave IV heparin gtt. REports that staff used words like "Trial and Error".    On 05/19/2016 - none at bedside but RN Updated them  Plan Ongoing goals chaplain consult to be offered to family if they are interested Full code  Acute renal failure (ARF) (HCC) Oliguric/anuric 05/15/2016; started CRRT after HD cath placement 05/15/16 (Right IJ) Now 05/19/16 anuric with siginificant anasarca  Plan crrt per renal; keeping negative 05/19/2016    Electrolyte imbalance Per renal  Shock liver Since RP bleed. Improving 05/19/2016  Plan monitor  Hypothermia New 05/17/2016 after CRRT Improved with CRRT warmer 05/19/2016  Plan Off bair hugger CRRT warmer  Paroxysmal atrial fibrillation (HCC) In and out of slow A Fib 05/17/2016  Plan monitor  Hypothyroidism contnue synthroid; changed to po 05/17/2016   Current use of proton pump inhibitor cotnnue for sup  Contraindication to deep vein thrombosis (DVT) prophylaxis No heparin or anticoag due to RP bleed Never any heeparin due to family reported hx of unique heparin sensitivity  Plan scd      FAMILY  - Updates: 05/19/2016 --> none a bedisde  - Inter-disciplinary family meet or Palliative Care meeting due by:  DAy 7. Current LOS is LOS 11 days  CODE STATUS    Code Status Orders        Start     Ordered   05/09/16 0131  Full code  Continuous     05/09/16 0133    Code Status History    Date Active Date Inactive Code Status Order ID Comments User Context    This patient has a current code status but no historical code status.        DISPO Keep in ICU      The patient is critically ill with multiple organ systems failure and requires high complexity decision making for assessment and support, frequent evaluation and titration of therapies, application of advanced monitoring technologies and extensive interpretation of multiple databases.   Critical Care Time devoted to patient care services described in this note is  30  Minutes. This time reflects time of care of this signee Dr Kalman Shan. This critical care time does not reflect procedure time,  or teaching time or supervisory time of PA/NP/Med student/Med Resident etc but could involve care discussion time    Dr. Kalman Shan, M.D., [facility].C.P Pulmonary and Critical Care Medicine Staff Physician [facility] [place] [facility] Pager: [phone], If no answer or between  15:00h - 7:00h: call [phone]  05/19/2016 9:48 AM

## 2016-05-20 ENCOUNTER — Inpatient Hospital Stay (HOSPITAL_COMMUNITY): Payer: Medicare Other

## 2016-05-20 LAB — RENAL FUNCTION PANEL
ALBUMIN: 2.5 g/dL — AB (ref 3.5–5.0)
ANION GAP: 5 (ref 5–15)
Albumin: 2.6 g/dL — ABNORMAL LOW (ref 3.5–5.0)
Anion gap: 5 (ref 5–15)
BUN: 34 mg/dL — AB (ref 6–20)
BUN: 29 mg/dL — AB (ref 6–20)
CALCIUM: 8.1 mg/dL — AB (ref 8.9–10.3)
CHLORIDE: 103 mmol/L (ref 101–111)
CO2: 26 mmol/L (ref 22–32)
CO2: 26 mmol/L (ref 22–32)
CREATININE: 1.65 mg/dL — AB (ref 0.61–1.24)
Calcium: 8.1 mg/dL — ABNORMAL LOW (ref 8.9–10.3)
Chloride: 104 mmol/L (ref 101–111)
Creatinine, Ser: 1.71 mg/dL — ABNORMAL HIGH (ref 0.61–1.24)
GFR calc Af Amer: 44 mL/min — ABNORMAL LOW (ref >60)
GFR calc Af Amer: 46 mL/min — ABNORMAL LOW (ref >60)
GFR calc non Af Amer: 38 mL/min — ABNORMAL LOW (ref >60)
GFR calc non Af Amer: 40 mL/min — ABNORMAL LOW (ref >60)
GLUCOSE: 121 mg/dL — AB (ref 65–99)
GLUCOSE: 132 mg/dL — AB (ref 65–99)
PHOSPHORUS: 2.4 mg/dL — AB (ref 2.5–4.6)
POTASSIUM: 4 mmol/L (ref 3.5–5.1)
Phosphorus: 2.6 mg/dL (ref 2.5–4.6)
Potassium: 4 mmol/L (ref 3.5–5.1)
SODIUM: 135 mmol/L (ref 135–145)
SODIUM: 134 mmol/L — AB (ref 135–145)

## 2016-05-20 LAB — CBC
HCT: 25.1 % — ABNORMAL LOW (ref 39.0–52.0)
HEMOGLOBIN: 7.9 g/dL — AB (ref 13.0–17.0)
MCH: 31.1 pg (ref 26.0–34.0)
MCHC: 31.5 g/dL (ref 30.0–36.0)
MCV: 98.8 fL (ref 78.0–100.0)
Platelets: 65 10*3/uL — ABNORMAL LOW (ref 150–400)
RBC: 2.54 MIL/uL — AB (ref 4.22–5.81)
RDW: 19.3 % — ABNORMAL HIGH (ref 11.5–15.5)
WBC: 16.3 10*3/uL — ABNORMAL HIGH (ref 4.0–10.5)

## 2016-05-20 LAB — HEPATIC FUNCTION PANEL
ALK PHOS: 141 U/L — AB (ref 38–126)
ALT: 515 U/L — AB (ref 17–63)
AST: 163 U/L — ABNORMAL HIGH (ref 15–41)
Albumin: 2.5 g/dL — ABNORMAL LOW (ref 3.5–5.0)
BILIRUBIN DIRECT: 3.1 mg/dL — AB (ref 0.1–0.5)
BILIRUBIN INDIRECT: 2.5 mg/dL — AB (ref 0.3–0.9)
BILIRUBIN TOTAL: 5.6 mg/dL — AB (ref 0.3–1.2)
Total Protein: 6.7 g/dL (ref 6.5–8.1)

## 2016-05-20 LAB — GLUCOSE, CAPILLARY
GLUCOSE-CAPILLARY: 103 mg/dL — AB (ref 65–99)
GLUCOSE-CAPILLARY: 127 mg/dL — AB (ref 65–99)
Glucose-Capillary: 108 mg/dL — ABNORMAL HIGH (ref 65–99)
Glucose-Capillary: 123 mg/dL — ABNORMAL HIGH (ref 65–99)
Glucose-Capillary: 124 mg/dL — ABNORMAL HIGH (ref 65–99)

## 2016-05-20 LAB — PROTIME-INR
INR: 1.41
PROTHROMBIN TIME: 17.4 s — AB (ref 11.4–15.2)

## 2016-05-20 LAB — MAGNESIUM: Magnesium: 2.2 mg/dL (ref 1.7–2.4)

## 2016-05-20 MED ORDER — WHITE PETROLATUM GEL
Status: AC
  Administered 2016-05-20: 12:00:00
  Filled 2016-05-20: qty 1

## 2016-05-20 MED ORDER — HEPARIN SODIUM (PORCINE) 1000 UNIT/ML DIALYSIS
1000.0000 [IU] | INTRAMUSCULAR | Status: DC | PRN
  Administered 2016-05-20: 3000 [IU] via INTRAVENOUS_CENTRAL
  Filled 2016-05-20 (×2): qty 6

## 2016-05-20 NOTE — Progress Notes (Signed)
Patient ID: Omar [name], male   DOB: [birth year], 71 y.o.   MRN: [id number]  [facility] Progress Note   Assessment/ Plan:   1. Acute kidney injury: Data appears to be most consistent with ATN arising from hemorrhagic shock. He remains anuric and without any evidence of renal recovery. Significantly volume overloaded on physical exam and will continue current CRRT prescription with ultrafiltration at net -50 mL per hour (instructed for nursing staff to increase to -100 mL per hour if possible). 2. Hemorrhagic shock from acute retroperitoneal bleed: Able to wean down pressors overnight remains on Levophed and vasopressin. 3. Acute blood loss anemia secondary to retroperitoneal hemorrhage: Hemoglobin/hematocrit stable overnight without need for supplemental PRBC transfusions. 4. Elevated liver enzymes: Secondary to what appears to be most consistent with shock liver from hemorrhagic shock- transaminases trending down.  Subjective:   Large volume of residuals noted indicative of ileus and prompting discontinuation of tube feeds.    Objective:   BP (!) 81/48   Pulse (!) 111   Temp (!) 96.8 F (36 C)   Resp 19   Ht '5\' 6"'  (1.676 m)   Wt (!) 164.7 kg (363 lb)   SpO2 100%   BMI 58.59 kg/m   Intake/Output Summary (Last 24 hours) at 05/20/16 0732 Last data filed at 05/20/16 0600  Gross per 24 hour  Intake          2278.17 ml  Output             6177 ml  Net         -3898.83 ml   Weight change: -2.268 kg (-5 lb)  Physical Exam: [id number], sedated, morbidly obese man CVS: Pulse regular rhythm, Tachycardia, S1 and S2 normal Resp: Diminished breath sounds bilaterally-no distinct rales or rhonchi Abd: Soft, obese, nontender Ext: 2-3+ upper and lower extremity edema, scrotal edema noted  Imaging: Dg Chest Port 1 View  Result Date: 05/20/2016 CLINICAL DATA:  Endotracheal tube placement. EXAM: PORTABLE CHEST 1 VIEW COMPARISON:  05/19/2016 FINDINGS: Endotracheal tube  has tip 3.8 cm above the carina. Left IJ central venous catheter and right IJ central venous catheters are unchanged. Enteric tube courses into the region of the stomach and off the inferior portion of the film as tip is not visualized. Lungs are adequately inflated with continued evidence of hazy bilateral perihilar and bibasilar opacification likely mild interstitial edema and possible small bilateral pleural effusions. Stable cardiomegaly. Remainder of the exam is unchanged. IMPRESSION: Stable findings likely representing mild interstitial edema with small bilateral pleural effusions. Mild cardiomegaly. Tubes and lines as described. Electronically Signed   By: Marin Olp M.D.   On: 05/20/2016 07:10   Dg Chest Port 1 View  Result Date: 05/19/2016 CLINICAL DATA:  Intubation. EXAM: PORTABLE CHEST 1 VIEW COMPARISON:  05/18/2016. FINDINGS: Endotracheal tube, NG tube, bilateral IJ lines in stable position. Cardiomegaly with diffuse bilateral pulmonary infiltrates consistent pulmonary edema. Bilateral pleural effusions, right side greater than left again noted. Chest is unchanged from prior exam. Surgical clips right neck . IMPRESSION: 1. Lines and tubes in stable position. 2. Congestive heart failure bilateral pulmonary edema and bilateral pleural effusions. Exam unchanged from prior exam. Electronically Signed   By: [place]   On: 05/19/2016 07:15    Labs: BMET  Recent Labs Lab 05/16/16 1520 05/17/16 0353 05/17/16 1627 05/18/16 0350 05/18/16 1555 05/19/16 0330 05/19/16 1515 05/20/16 0405  NA 139 138 138 136 137 137 135 135  K 4.3 4.3 4.1  3.9 3.7 4.0 4.1 4.0  CL 102 103 104 102 101 101 103 104  CO2 '27 27 26 28 28 28 27 26  ' GLUCOSE 136* 148* 144* 132* 162* 138* 163* 121*  BUN 36* 33* 33* 33* 35* 35* 33* 34*  CREATININE 2.33* 1.99* 2.02* 1.86* 1.82* 1.71* 1.60* 1.71*  CALCIUM 8.0* 8.1* 7.9* 8.0* 7.9* 8.0* 7.9* 8.1*  PHOS 2.7 2.0* 1.8* 1.6* 2.1*  --  1.8* 2.4*   CBC  Recent  Labs Lab 05/16/16 0359  05/17/16 0352  05/18/16 0350 05/18/16 1555 05/19/16 0330 05/19/16 1537 05/20/16 0405  WBC 12.6*  < > 11.8*  < > 14.5* 18.7* 18.1* 17.8* 16.3*  NEUTROABS 11.4*  --  10.5*  --  12.6*  --  14.8*  --   --   HGB 7.0*  < > 7.2*  < > 7.7* 7.9* 8.0* 8.0* 7.9*  HCT 21.4*  < > 22.0*  < > 23.9* 25.5* 25.2* 25.7* 25.1*  MCV 94.7  < > 92.4  < > 95.6 95.9 97.3 98.1 98.8  PLT 97*  < > 96*  < > 78* 90* 76* 72* 65*  < > = values in this interval not displayed.  Medications:    . chlorhexidine gluconate (MEDLINE KIT)  15 mL Mouth Rinse BID  . Chlorhexidine Gluconate Cloth  6 each Topical Daily  . feeding supplement (PRO-STAT SUGAR FREE 64)  30 mL Per Tube QID  . hydrocortisone sod succinate (SOLU-CORTEF) inj  50 mg Intravenous Q8H  . insulin aspart  0-9 Units Subcutaneous Q4H  . levothyroxine  125 mcg Oral QAC breakfast  . mouth rinse  15 mL Mouth Rinse QID  . pantoprazole sodium  40 mg Per Tube Daily  . polyethylene glycol  17 g Oral Daily   Elmarie Shiley, MD 05/20/2016, 7:32 AM

## 2016-05-20 NOTE — Progress Notes (Signed)
eLink Physician-Brief Progress Note Patient Name: Omar FellingWilliam R [name] DOB: 11/27/1945 MRN: [id number]   Date of Service  05/20/2016  HPI/Events of Note  Attempting to remove ETT despite chemical sedation  eICU Interventions  Renew restraints     Intervention Category Major Interventions: Change in mental status - evaluation and management  Max FickleDouglas Kypton Eltringham 05/20/2016, 11:44 PM

## 2016-05-20 NOTE — Progress Notes (Signed)
PCCM Progress Note  Admission date: 06/02/2016 Referring provider: [facility]  CC: altered mental status  HPI: 71 y/o male from [facility] admitted 3/5 with altered mental status with aspiration, sepsis, hypoxic/hypercapnic respiratory failure.     has a past medical history of Asthma; Atrial fibrillation (HCC); Gout; Hypertension; Morbid obesity (HCC); Multinodular goiter; Osteoarthrosis and allied disorders; Other and unspecified hyperlipidemia; Stroke [facility](HCC); Thrombocytopenia (HCC); and Vitamin D deficiency.    Antibiotics: Zosyn 3/06 >> 3/08 Rocephin (H.Flu PNA) 3/08 >>  Cultures: Urine 3/05 >> negative Blood 3/06 >>   Lines/tubes: ETT 3/05 >> 3/8 Lt IJ CVL 3/07 >>   Events: [facility]05  [facility] >> Haemophilus influenzae (beta lactamase positive) Echo 3/06 >> EF 65 to 70%, mod LVH, mod TR, PAS 39 mmHg CT head 3/7 >> no acute abnormality   3/08  extubated off pressors. No longer meeting sirs criteria  3/10  Reintubated, concern for bleed with drop in Hgb on heparin gtt, resp distress, AF w RVR.   CT ABD/Pelvis 3/10 >> large right retroperitoneal hematoma, small bilateral pleural effusions with extensive consolidation/atelectasis 3/11  Received 4 FFP overnight, 2 units PRBC  3/11  Remains on high dose vasopressors, CT c/w retroperitoneal bleed  RN reports hypotension on levo + neo.  FFP infusing.   3/12 - max pressors. Shock liver. Deeply sedated. S/p 4 units prbc and 4 unit ffp as of last night -> got 1 unit prbc, platlelet   And FFP x 5 -> HD cath placed . Famiily very angry at outcome of RP hemorrhage. Reports he is ssensitive to heparin based on 2013 experience. CCS -> no ro9le for surgery. Heme consult ->  Done. CRRT started  3/13 - events from yesterday noted. Down on vasopressor need. Down to levophed 18, neo 50, and vasopresson 0.03. Ongoing CRRT. On 200 fentantyl mcg and RN reports responsiveness on left. Shock liver better. Renal rec dc bic gtt. S/p  PRBC, FFP and platlets  3/14 - off neo, still on levophed 14 and shock dose vasop. Improved ur op but still in oliguric bleed. On LawyerBair hugger  . Shock liver better. Daughte at bedside: still very upset that patient was given IV heparin gtt  3/15 - still on levophed 18 and shock vaso. On even CRRT. 50% fio2. Bair hugger +. Oliguria +. Ur Op dropping. Tolerating TF. SEdated with fent gtt. Moves left per RN  3/16 - inreased levoiphed need to 24mcg, on vaso. Seems to corelated with hydrocort dc. CBC holding.  Anuric - makigg 30cc urine per shift. Renal planning to incrase volume removeal. Singificant 3rd spacing +++/. Tolerating TF. Lorre NickWWUAH - moves left side and bites tube. Off bair hugger.    SUBJECTIVE/OVERNIGHT/INTERVAL HX 3/17 - improved levophed afte steroids started. Agitated on left during WUA. Anuric; on CRRT.   Vital signs: BP (!) 86/46   Pulse (!) 122   Temp (!) 96.6 F (35.9 C)   Resp (!) 21   Ht 5\' 6"  (1.676 m)   Wt (!) 164.7 kg (363 lb)   SpO2 100%   BMI 58.59 kg/m   Intake/output:  Intake/Output Summary (Last 24 hours) at 05/20/16 0819 Last data filed at 05/20/16 0700  Gross per 24 hour  Intake          2261.27 ml  Output             6308 ml  Net         -4046.73 ml  I/O last 3 completed shifts: In: 3780.1 [I.V.:2530.1; NG/GT:1250] Out: 7826 [Urine:72; Emesis/NG output:3000; Other:4754]  EXAM  General Appearance:    Looks criticall ill OBESE - yes  Head:    Normocephalic, without obvious abnormality, atraumatic  Eyes:    PERRL - yes, conjunctiva/corneas - clear      Ears:    Normal external ear canals, both ears  Nose:   NG tube - no  Throat:  ETT TUBE - yes , OG tube - yes on TF  Neck:   Supple,  No enlargement/tenderness/nodules     Lungs:     Clear to auscultation bilaterally, Ventilator   Synchrony - yes but not when agitated  Chest wall:    No deformity  Heart:    S1 and S2 normal, no murmur, CVP - no.  Pressors - yes  Abdomen:     Soft, no masses, no  organomegaly  Genitalia:    Not done  Rectal:   not done  Extremities:   Extremities- edema     Skin:   Intact in exposed areas .      Neurologic:   Sedation - wua in progress -> RASS - +2 . Moves all 4s - restless on left. CAM-ICU - positive for delirium due to agitation . Orientation - not oriented       LABS  PULMONARY  Recent Labs Lab 05/13/16 1211  05/14/16 0727 05/14/16 1044 05/15/16 0333 05/15/16 1522 05/16/16 0515 05/16/16 1500 05/17/16 0251  PHART 7.318*  < > 7.288* 7.252* 7.283* 7.317* 7.292* 7.373 7.476*  PCO2ART 53.8*  < > 41.1 44.1 40.6 44.0 60.8* 49.0* 36.4  PO2ART 89.0  < > 72.0* 84.0 72.0* 77.2* 71.7* 72.2* 57.0*  HCO3 27.6  < > 19.7* 19.3* 19.3* 21.9 28.5* 27.8 27.0  TCO2 29  --  21 21 21   --   --   --  28  O2SAT 96.0  < > 92.0 94.0 93.0 94.4 92.8 94.9 92.0  < > = values in this interval not displayed.  CBC  Recent Labs Lab 05/19/16 0330 05/19/16 1537 05/20/16 0405  HGB 8.0* 8.0* 7.9*  HCT 25.2* 25.7* 25.1*  WBC 18.1* 17.8* 16.3*  PLT 76* 72* 65*    COAGULATION  Recent Labs Lab 05/16/16 0359 05/17/16 0352 05/18/16 0350 05/19/16 0330 05/20/16 0405  INR 2.21 1.78 1.52 1.40 1.41    CARDIAC   Recent Labs Lab 05/15/16 1030 05/15/16 2012 05/16/16 0230  TROPONINI 0.83* 0.81* 0.48*   No results for input(s): PROBNP in the last 168 hours.   CHEMISTRY  Recent Labs Lab 05/16/16 0359  05/17/16 0352  05/17/16 1627 05/18/16 0350 05/18/16 1555 05/19/16 0330 05/19/16 1515 05/20/16 0405  NA 142  < >  --   < > 138 136 137 137 135 135  K 4.4  < >  --   < > 4.1 3.9 3.7 4.0 4.1 4.0  CL 106  < >  --   < > 104 102 101 101 103 104  CO2 29  < >  --   < > 26 28 28 28 27 26   GLUCOSE 179*  < >  --   < > 144* 132* 162* 138* 163* 121*  BUN 45*  < >  --   < > 33* 33* 35* 35* 33* 34*  CREATININE 3.03*  < >  --   < > 2.02* 1.86* 1.82* 1.71* 1.60* 1.71*  CALCIUM 7.9*  < >  --   < >  7.9* 8.0* 7.9* 8.0* 7.9* 8.1*  MG 2.0  --  2.1  --   --  2.2   --  2.3  --  2.2  PHOS 3.7  < >  --   < > 1.8* 1.6* 2.1*  --  1.8* 2.4*  < > = values in this interval not displayed. Estimated Creatinine Clearance: 57.6 mL/min (A) (by C-G formula based on SCr of 1.71 mg/dL (H)).   LIVER  Recent Labs Lab 05/15/16 0340  05/16/16 0359  05/17/16 0352  05/18/16 0350 05/18/16 1555 05/19/16 0330 05/19/16 1515 05/20/16 0405  AST 3,667*  --  1,884*  --  1,041*  --   --   --  367*  --  163*  ALT 2,019*  --  1,414*  --  1,106*  --   --   --  769*  --  515*  ALKPHOS 104  --  118  --  133*  --   --   --  160*  --  141*  BILITOT 5.0*  --  5.5*  --  5.8*  --   --   --  5.2*  --  5.6*  PROT 6.9  --  7.4  --  6.6  --   --   --  6.7  --  6.7  ALBUMIN 2.9*  --  3.2*  3.1*  < > 2.8*  < > 2.7* 2.7* 2.6* 2.5* 2.5*  2.5*  INR 3.93  < > 2.21  --  1.78  --  1.52  --  1.40  --  1.41  < > = values in this interval not displayed.   INFECTIOUS  Recent Labs Lab 05/16/16 0400 05/16/16 2329 05/19/16 0951  LATICACIDVEN 2.0* 1.9 1.4     ENDOCRINE CBG (last 3)   Recent Labs  05/19/16 2306 05/20/16 0400 05/20/16 0733  GLUCAP 117* 108* 103*         IMAGING x48h  - image(s) personally visualized  -   highlighted in bold Dg Chest Port 1 View  Result Date: 05/20/2016 CLINICAL DATA:  Endotracheal tube placement. EXAM: PORTABLE CHEST 1 VIEW COMPARISON:  05/19/2016 FINDINGS: Endotracheal tube has tip 3.8 cm above the carina. Left IJ central venous catheter and right IJ central venous catheters are unchanged. Enteric tube courses into the region of the stomach and off the inferior portion of the film as tip is not visualized. Lungs are adequately inflated with continued evidence of hazy bilateral perihilar and bibasilar opacification likely mild interstitial edema and possible small bilateral pleural effusions. Stable cardiomegaly. Remainder of the exam is unchanged. IMPRESSION: Stable findings likely representing mild interstitial edema with small bilateral  pleural effusions. Mild cardiomegaly. Tubes and lines as described. Electronically Signed   By: Elberta Fortis M.D.   On: 05/20/2016 07:10   Dg Chest Port 1 View  Result Date: 05/19/2016 CLINICAL DATA:  Intubation. EXAM: PORTABLE CHEST 1 VIEW COMPARISON:  05/18/2016. FINDINGS: Endotracheal tube, NG tube, bilateral IJ lines in stable position. Cardiomegaly with diffuse bilateral pulmonary infiltrates consistent pulmonary edema. Bilateral pleural effusions, right side greater than left again noted. Chest is unchanged from prior exam. Surgical clips right neck . IMPRESSION: 1. Lines and tubes in stable position. 2. Congestive heart failure bilateral pulmonary edema and bilateral pleural effusions. Exam unchanged from prior exam. Electronically Signed   By: Maisie Fus  Register   On: 05/19/2016 07:15       ASSESSMENT and PLAN  Acute respiratory failure (HCC) Still does not meett  SBT criteria 05/20/2016 LOS 12 as of 05/20/2016    Plan Full vent support Likely needs trach given goals of care  Shock circulatory (HCC) Due to massive severe RP hemorrhage on 05/13/16 Improving pressor needs 05/17/2016 but slight worse 05/18/16 and even worse 05/19/2016   No evidence of active bleed Worsening shock 05/19/2016 correlates with dc hydrocort Improved pressor 05/20/2016 after hydrocort restarted   Plan Pressor support for MAP > 65 Continue hydrocort 05/19/2016  Consider giaprezza if CT repeat for bleed is ok   Retroperitoneal hemorrhage Large on CT 05/13/16 -= 05/14/16 No role for surgery per CCS 05/15/16 Heme consult noted 05/15/16 - their findings note On 05/19/2016  And 05/20/2016- no evidence of active bleed clinically   Plan Support with blood products as needed Cbc  q12h continue reimage CT abd 05/20/2016    Altered mental status RASS -3 with fentanyl gtt + prn. Can move left side on wua (baseline right weakness) Did nod appropriately 05/18/2016. Some agitation 05/19/2016  On 05/20/2016 - >  aigitated on WUA    Plan fent gtt + sedation  Hemiparesis affecting right side as late effect of cerebrovascular accident [facility]) Old baseline issue  On enteral nutrition Restarted  tube feeds 05/16/16 and tolerating it well as of 05/20/2016  Started miralax 05/18/2016    Goals of care, counseling/discussion Full code. Family very upset at outcome - see 05/14/16 and 05/15/16 High mortality situation and defnitely high morbidity situation  On 05/17/16 - daughter at bedside: very upset that Iv Heparin gtt was given. Says that in 2013 for A Fib stroke admit got IV heparin gtt and bled around heart. Upset to heart hat this is not in record because [facility] was [facility]. She says other anticoagulants including coumadin fine but not IV heparing ttt. Reports that she informed both at [facility] and [facility] to staff that patient should never get IV heparin gttt (she did this a priori per her). Upset that we did not take this into account and list in allergy list and still gave IV heparin gtt. REports that staff used words like "Trial and Error".    On 05/19/2016 and 05/20/2016 - none at bedside but RN Updated them  Plan Ongoing goals chaplain consult to be offered to family if they are interested Full code  Acute renal failure (ARF) (HCC) Oliguric/anuric 05/15/2016; started CRRT after HD cath placement 05/15/16 (Right IJ) Now 05/19/16 anuric with siginificant anasarca On 05/20/2016 - still anuric  Plan crrt per renal; keeping negative since 05/19/2016    Electrolyte imbalance Per renal  Shock liver Since RP bleed. Improving 05/20/2016  Plan monitor  Hypothermia New 05/17/2016 after CRRT Improved with CRRT warmer 05/19/2016  Plan Off bair hugger CRRT warmer  Paroxysmal atrial fibrillation (HCC) In and out of slow A Fib 05/17/2016  HR 120 05/20/2016 with wua and agitration  Plan monitor  Hypothyroidism contnue synthroid; changed to po 05/17/2016   Current use of proton pump  inhibitor cotnnue for sup as of 05/20/2016   Contraindication to deep vein thrombosis (DVT) prophylaxis No heparin or anticoag due to RP bleed Never any heeparin due to family reported hx of unique heparin sensitivity  Plan scd      FAMILY  - Updates: 05/20/2016 --> none at bedside  - Inter-disciplinary family meet or Palliative Care meeting due by:  DAy 7. Current LOS is LOS 12 days  CODE STATUS    Code Status Orders        Start  Ordered   05/09/16 0131  Full code  Continuous     05/09/16 0133    Code Status History    Date Active Date Inactive Code Status Order ID Comments User Context   This patient has a current code status but no historical code status.        DISPO Keep in ICU       The patient is critically ill with multiple organ systems failure and requires high complexity decision making for assessment and support, frequent evaluation and titration of therapies, application of advanced monitoring technologies and extensive interpretation of multiple databases.   Critical Care Time devoted to patient care services described in this note is  30  Minutes. This time reflects time of care of this signee Dr Kalman Shan. This critical care time does not reflect procedure time, or teaching time or supervisory time of PA/NP/Med student/Med Resident etc but could involve care discussion time    Dr. Kalman Shan, M.D., [facility].C.P Pulmonary and Critical Care Medicine Staff Physician [facility] [place] Pulmonary and Critical Care Pager: [phone], If no answer or between  15:00h - 7:00h: call [phone]  05/20/2016 8:20 AM

## 2016-05-21 ENCOUNTER — Inpatient Hospital Stay (HOSPITAL_COMMUNITY): Payer: Medicare Other

## 2016-05-21 LAB — RENAL FUNCTION PANEL
ALBUMIN: 2.7 g/dL — AB (ref 3.5–5.0)
ANION GAP: 4 — AB (ref 5–15)
Albumin: 2.3 g/dL — ABNORMAL LOW (ref 3.5–5.0)
Anion gap: 9 (ref 5–15)
BUN: 27 mg/dL — ABNORMAL HIGH (ref 6–20)
BUN: 24 mg/dL — AB (ref 6–20)
CHLORIDE: 103 mmol/L (ref 101–111)
CHLORIDE: 99 mmol/L — AB (ref 101–111)
CO2: 28 mmol/L (ref 22–32)
CO2: 25 mmol/L (ref 22–32)
CREATININE: 1.54 mg/dL — AB (ref 0.61–1.24)
Calcium: 8.2 mg/dL — ABNORMAL LOW (ref 8.9–10.3)
Calcium: 8.4 mg/dL — ABNORMAL LOW (ref 8.9–10.3)
Creatinine, Ser: 1.51 mg/dL — ABNORMAL HIGH (ref 0.61–1.24)
GFR calc Af Amer: 51 mL/min — ABNORMAL LOW (ref >60)
GFR calc non Af Amer: 43 mL/min — ABNORMAL LOW (ref >60)
GFR, EST AFRICAN AMERICAN: 50 mL/min — AB (ref >60)
GFR, EST NON AFRICAN AMERICAN: 44 mL/min — AB (ref >60)
GLUCOSE: 125 mg/dL — AB (ref 65–99)
Glucose, Bld: 125 mg/dL — ABNORMAL HIGH (ref 65–99)
PHOSPHORUS: 2.8 mg/dL (ref 2.5–4.6)
Phosphorus: 2.4 mg/dL — ABNORMAL LOW (ref 2.5–4.6)
Potassium: 4.3 mmol/L (ref 3.5–5.1)
Potassium: 4.1 mmol/L (ref 3.5–5.1)
Sodium: 135 mmol/L (ref 135–145)
Sodium: 133 mmol/L — ABNORMAL LOW (ref 135–145)

## 2016-05-21 LAB — CBC
HCT: 21.9 % — ABNORMAL LOW (ref 39.0–52.0)
HCT: 25.1 % — ABNORMAL LOW (ref 39.0–52.0)
Hemoglobin: 6.8 g/dL — CL (ref 13.0–17.0)
Hemoglobin: 8 g/dL — ABNORMAL LOW (ref 13.0–17.0)
MCH: 30.9 pg (ref 26.0–34.0)
MCH: 31.5 pg (ref 26.0–34.0)
MCHC: 31.1 g/dL (ref 30.0–36.0)
MCHC: 31.9 g/dL (ref 30.0–36.0)
MCV: 99.5 fL (ref 78.0–100.0)
MCV: 98.8 fL (ref 78.0–100.0)
PLATELETS: 74 10*3/uL — AB (ref 150–400)
PLATELETS: 72 10*3/uL — AB (ref 150–400)
RBC: 2.2 MIL/uL — AB (ref 4.22–5.81)
RBC: 2.54 MIL/uL — ABNORMAL LOW (ref 4.22–5.81)
RDW: 20.7 % — AB (ref 11.5–15.5)
RDW: 20.4 % — ABNORMAL HIGH (ref 11.5–15.5)
WBC: 11.1 10*3/uL — AB (ref 4.0–10.5)
WBC: 10 10*3/uL (ref 4.0–10.5)

## 2016-05-21 LAB — GLUCOSE, CAPILLARY
GLUCOSE-CAPILLARY: 121 mg/dL — AB (ref 65–99)
GLUCOSE-CAPILLARY: 126 mg/dL — AB (ref 65–99)
GLUCOSE-CAPILLARY: 127 mg/dL — AB (ref 65–99)
GLUCOSE-CAPILLARY: 128 mg/dL — AB (ref 65–99)
Glucose-Capillary: 110 mg/dL — ABNORMAL HIGH (ref 65–99)
Glucose-Capillary: 114 mg/dL — ABNORMAL HIGH (ref 65–99)

## 2016-05-21 LAB — PREPARE RBC (CROSSMATCH)

## 2016-05-21 LAB — PROTIME-INR
INR: 1.36
Prothrombin Time: 16.9 seconds — ABNORMAL HIGH (ref 11.4–15.2)

## 2016-05-21 LAB — MAGNESIUM: MAGNESIUM: 2.2 mg/dL (ref 1.7–2.4)

## 2016-05-21 MED ORDER — VITAL HIGH PROTEIN PO LIQD
1000.0000 mL | ORAL | Status: DC
  Administered 2016-05-22 – 2016-05-30 (×7): 1000 mL
  Filled 2016-05-21 (×3): qty 1000

## 2016-05-21 MED ORDER — SODIUM CHLORIDE 0.9 % IV SOLN: Freq: Once | INTRAVENOUS | Status: DC

## 2016-05-21 MED ORDER — SODIUM CHLORIDE 0.9 % IV SOLN
0.5000 mg/h | INTRAVENOUS | Status: DC
  Administered 2016-05-21: 1.5 mg/h via INTRAVENOUS
  Administered 2016-05-21: 1 mg/h via INTRAVENOUS
  Administered 2016-05-22: 2 mg/h via INTRAVENOUS
  Administered 2016-05-23 – 2016-05-24 (×4): 4 mg/h via INTRAVENOUS
  Filled 2016-05-21 (×7): qty 5

## 2016-05-21 MED ORDER — HYDROMORPHONE HCL 1 MG/ML IJ SOLN
0.5000 mg | INTRAMUSCULAR | Status: DC | PRN
  Administered 2016-05-27: 2 mg via INTRAVENOUS
  Administered 2016-05-29: 1 mg via INTRAVENOUS
  Filled 2016-05-21: qty 1
  Filled 2016-05-21: qty 2

## 2016-05-21 NOTE — Progress Notes (Signed)
    Recent Labs Lab 05/19/16 1537 05/20/16 0405 05/21/16 0929  HGB 8.0* 7.9* 6.8*  HCT 25.7* 25.1* 21.9*  WBC 17.8* 16.3* 11.1*  PLT 72* 65* 74*    Hgb < 7. No active bleed. Profile suggests anemia of critical illness  Plan 1 unit prbc and recheck cbc  Dr. Kalman ShanMurali Axcel Horsch, M.D., [facility].C.P Pulmonary and Critical Care Medicine Staff Physician [facility] [place] Pulmonary and Critical Care Pager: [phone], If no answer or between  15:00h - 7:00h: call [phone]  05/21/2016 12:00 PM

## 2016-05-21 NOTE — Progress Notes (Signed)
Patient ID: Omar [name], male   DOB: 1945/11/21, 71 y.o.   MRN: [id number]  [facility] Progress Note   Assessment/ Plan:   1. Acute kidney injury: Likely ATN from hemorrhagic shock. He remains anuric and without any evidence of renal recovery. Significantly volume overloaded on physical exam and will continue current CRRT prescription with ultrafiltration at net -50 mL per hour (continue effort to increase to -100 mL per hour if possible). 2. Hemorrhagic shock from acute retroperitoneal bleed: Remains on pressors with limited ability to wean off. 3. Acute blood loss anemia secondary to retroperitoneal hemorrhage: Hemoglobin/hematocrit stable overnight without need for supplemental PRBC transfusions. 4. Elevated liver enzymes: Secondary to what appears to be most consistent with shock liver from hemorrhagic shock- transaminases trending down.  Subjective:   Attempted decreasing sedation overnight however, restarted again after patient agitated.    Objective:   BP 91/60   Pulse 81   Temp (!) 96.8 F (36 C)   Resp 20   Ht '5\' 6"'  (1.676 m)   Wt (!) 167 kg (368 lb 2.7 oz)   SpO2 100%   BMI 59.42 kg/m   Intake/Output Summary (Last 24 hours) at 05/21/16 0739 Last data filed at 05/21/16 0700  Gross per 24 hour  Intake           2382.4 ml  Output             3423 ml  Net          -1040.6 ml   Weight change: 2.344 kg (5 lb 2.7 oz)  Physical Exam: [id number], sedated, morbidly obese man CVS: Pulse regular rhythm, Normal rate, S1 and S2 normal Resp: Diminished breath sounds bilaterally-no distinct rales or rhonchi Abd: Soft, obese, nontender Ext: 2-3+ upper and lower extremity edema, scrotal edema noted  Imaging: Ct Abdomen Pelvis Wo Contrast  Result Date: 05/20/2016 CLINICAL DATA:  Retroperitoneal bleeding. EXAM: CT ABDOMEN AND PELVIS WITHOUT CONTRAST TECHNIQUE: Multidetector CT imaging of the abdomen and pelvis was performed following the standard protocol  without IV contrast. COMPARISON:  CT abdomen pelvis - 05/13/2016 FINDINGS: The lack of intravenous contrast limits the ability to evaluate solid abdominal organs. Lower chest: Evaluation of lower thorax is degraded secondary to patient respiratory artifact. Limited visualization of lower thorax demonstrates small bilateral effusions with associated bibasilar consolidative opacities and air bronchograms, right greater than left. Cardiomegaly.  No pericardial effusion. Hepatobiliary: Normal hepatic contour. Interval development of ill-defined hypoattenuating lesion within the medial aspect of the dome of the right lobe of the liver measuring approximately 5.0 x 3.3 x 4.9 cm (axial image 13, series 3, coronal image 86, series 4), incompletely evaluated on this noncontrast examination, though new compared to the 05/13/2016 examination and [place] worrisome for an area of developing infection. Cholelithiasis without evidence of cholecystitis on this noncontrast examination. Trace amount of intra-abdominal ascites is noted about the caudal aspect the right lobe of the liver. Pancreas: Normal noncontrast appearance of the pancreas Spleen: Normal noncontrast appearance of the spleen Adrenals/Urinary Tract: Bilateral hypoattenuating renal lesions the largest right-sided lesion measuring approximately 2.7 cm in diameter (image 39, series 3), incompletely characterize though favored to represent renal cysts. No renal stones. No renal stones are seen along expected course of either ureter or the urinary bladder. The urinary bladder is decompressed with a Foley catheter. Normal noncontrast appearance of the bilateral adrenal glands. Stomach/Bowel: Moderate colonic stool burden without evidence of enteric obstruction. The bowel is normal in course and caliber without  wall thickening. No pneumoperitoneum, pneumatosis or portal venous gas. Vascular/Lymphatic: Minimal amount of atherosclerotic plaque within a normal caliber abdominal  aorta. 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 Reproductive: Trace amount of free fluid in the pelvic cul-de-sac. Other: Re- demonstrated known large right-sided retroperitoneal hemorrhage. The hemorrhage again is noted to extends to the level of the right hemipelvis (image 72, series 3). The hemorrhage is noted to again displace the right kidney medially. Exact measurements are difficult secondary to the irregular shape of the retroperitoneal hemorrhage however dominant component of the hemorrhage appears grossly unchanged measuring approximately 18.4 x 25.6 cm (axial image 55, series 3; coronal image 73, series 8)), previously, 17.4 x 20.2 cm. Hyperattenuating blood products are again seen scattered throughout scattered components of the complex retroperitoneal hemorrhage (representative images 37 and 54, series 3) likely indicative of areas of more recent hemorrhage. No definitive intra peritoneal extension. Large amount of body wall edema. Musculoskeletal: No acute or aggressive osseous abnormalities. 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 IMPRESSION: 1. Grossly unchanged size of large mixed attenuating right-sided retroperitoneal hemorrhage with dominant component measuring approximately 25.6 cm in diameter. No definitive intraperitoneal extension. 2. Interval development of ill-defined hypoattenuating 5 cm lesion within the medial aspect of the dome of the right lobe of the liver - while incompletely evaluated on this noncontrast examination, given rapid development since the 05/2008/2018 examination, this area is worrisome for a hepatic abscess or an intraparenchymal hepatic hemorrhage (unusual unless there is an underlying hepatic lesion which was  not demonstrated on prior noncontrast examination). 3. Cholelithiasis without evidence cholecystitis on this noncontrast examination. 4. Similar findings of cardiomegaly, small bilateral effusions and associated bibasilar consolidative opacities worrisome for multifocal infection. 5. Extensive body wall edema. Critical Value/emergent results were called by telephone at the time of interpretation on 05/20/2016 at 11:32 am to Dr. Brand Males , who verbally acknowledged these results. Electronically Signed   By: Sandi Mariscal M.D.   On: 05/20/2016 11:48   Dg Chest Port 1 View  Result Date: 05/21/2016 CLINICAL DATA:  Retroperitoneal bleeding. EXAM: PORTABLE CHEST 1 VIEW COMPARISON:  Yesterday. FINDINGS: Endotracheal tube in satisfactory position. Bilateral jugular catheter tips at the origin of the superior vena cava. Nasogastric tube extending into the distal stomach. Stable enlarged cardiac silhouette. Decreased bilateral airspace opacity. Stable small right pleural effusion. IMPRESSION: Improving changes of congestive heart failure with stable cardiomegaly. Electronically Signed   By: Claudie Revering M.D.   On: 05/21/2016 07:23   Dg Chest Port 1 View  Result Date: 05/20/2016 CLINICAL DATA:  Endotracheal tube placement. EXAM: PORTABLE CHEST 1 VIEW COMPARISON:  05/19/2016 FINDINGS: Endotracheal tube has tip 3.8 cm above the carina. Left IJ central venous catheter and right IJ central venous catheters are unchanged. Enteric tube courses into the region of the stomach and off the inferior portion of the film as tip is not visualized. Lungs are adequately inflated with continued evidence of hazy bilateral perihilar and bibasilar opacification likely mild interstitial edema and possible small bilateral pleural effusions. Stable cardiomegaly. Remainder of the exam is unchanged. IMPRESSION: Stable findings likely representing mild interstitial edema with small bilateral pleural effusions. Mild cardiomegaly. Tubes and  lines as described. Electronically Signed   By: Marin Olp M.D.   On:  05/20/2016 07:10    Labs: BMET  Recent Labs Lab 05/17/16 1627 05/18/16 0350 05/18/16 1555 05/19/16 0330 05/19/16 1515 05/20/16 0405 05/20/16 1600 05/21/16 0400  NA 138 136 137 137 135 135 134* 135  K 4.1 3.9 3.7 4.0 4.1 4.0 4.0 4.3  CL 104 102 101 101 103 104 103 103  CO2 '26 28 28 28 27 26 26 28  ' GLUCOSE 144* 132* 162* 138* 163* 121* 132* 125*  BUN 33* 33* 35* 35* 33* 34* 29* 27*  CREATININE 2.02* 1.86* 1.82* 1.71* 1.60* 1.71* 1.65* 1.54*  CALCIUM 7.9* 8.0* 7.9* 8.0* 7.9* 8.1* 8.1* 8.2*  PHOS 1.8* 1.6* 2.1*  --  1.8* 2.4* 2.6 2.4*   CBC  Recent Labs Lab 05/16/16 0359  05/17/16 0352  05/18/16 0350 05/18/16 1555 05/19/16 0330 05/19/16 1537 05/20/16 0405  WBC 12.6*  < > 11.8*  < > 14.5* 18.7* 18.1* 17.8* 16.3*  NEUTROABS 11.4*  --  10.5*  --  12.6*  --  14.8*  --   --   HGB 7.0*  < > 7.2*  < > 7.7* 7.9* 8.0* 8.0* 7.9*  HCT 21.4*  < > 22.0*  < > 23.9* 25.5* 25.2* 25.7* 25.1*  MCV 94.7  < > 92.4  < > 95.6 95.9 97.3 98.1 98.8  PLT 97*  < > 96*  < > 78* 90* 76* 72* 65*  < > = values in this interval not displayed.  Medications:    . chlorhexidine gluconate (MEDLINE KIT)  15 mL Mouth Rinse BID  . Chlorhexidine Gluconate Cloth  6 each Topical Daily  . feeding supplement (PRO-STAT SUGAR FREE 64)  30 mL Per Tube QID  . hydrocortisone sod succinate (SOLU-CORTEF) inj  50 mg Intravenous Q8H  . insulin aspart  0-9 Units Subcutaneous Q4H  . levothyroxine  125 mcg Oral QAC breakfast  . mouth rinse  15 mL Mouth Rinse QID  . pantoprazole sodium  40 mg Per Tube Daily  . polyethylene glycol  17 g Oral Daily   Elmarie Shiley, MD 05/21/2016, 7:39 AM

## 2016-05-21 NOTE — Progress Notes (Signed)
dw daughter - agreed to trach  Dr. Kalman ShanMurali Lawernce Earll, M.D., [facility].C.P Pulmonary and Critical Care Medicine Staff Physician [facility] Pulmonary and Critical Care Pager: [phone], If no answer or between  15:00h - 7:00h: call [phone]  05/21/2016 9:57 AM

## 2016-05-21 NOTE — Progress Notes (Signed)
CRITICAL VALUE ALERT  Critical value received: hgb 6.8   Date of notification: 05/21/16  Time of notification: 1159  Critical value read back:yes  Nurse who received alert:  Hermelinda MedicusIrekia Jarret Torre RN  MD notified [phone]

## 2016-05-21 NOTE — Progress Notes (Signed)
PCCM Progress Note  Admission date: 05/29/2016 Referring provider: Dr. Clarene [facility]Manus, [facility]  CC: altered mental status  HPI: 71 y/o male from [facility] admitted 3/5 with altered mental status with aspiration, sepsis, hypoxic/hypercapnic respiratory failure.     has a past medical history of Asthma; Atrial fibrillation (HCC); Gout; Hypertension; Morbid obesity (HCC); Multinodular goiter; Osteoarthrosis and allied disorders; Other and unspecified hyperlipidemia; Stroke [facility](HCC); Thrombocytopenia (HCC); and Vitamin D deficiency.    Antibiotics: Zosyn 3/06 >> 3/08 Rocephin (H.Flu PNA) 3/08 >>  Cultures: Urine 3/05 >> negative Blood 3/06 >>   Lines/tubes: ETT 3/05 >> 3/8 Lt IJ CVL 3/07 >>   Events: [facility]05  [facility] >> Haemophilus influenzae (beta lactamase positive) Echo 3/06 >> EF 65 to 70%, mod LVH, mod TR, PAS 39 mmHg CT head 3/7 >> no acute abnormality   3/08  extubated off pressors. No longer meeting sirs criteria  3/10  Reintubated, concern for bleed with drop in Hgb on heparin gtt, resp distress, AF w RVR.   CT ABD/Pelvis 3/10 >> large right retroperitoneal hematoma, small bilateral pleural effusions with extensive consolidation/atelectasis 3/11  Received 4 FFP overnight, 2 units PRBC  3/11  Remains on high dose vasopressors, CT c/w retroperitoneal bleed  RN reports hypotension on levo + neo.  FFP infusing.   3/12 - max pressors. Shock liver. Deeply sedated. S/p 4 units prbc and 4 unit ffp as of last night -> got 1 unit prbc, platlelet   And FFP x 5 -> HD cath placed . Famiily very angry at outcome of RP hemorrhage. Reports he is ssensitive to heparin based on 2013 experience. CCS -> no ro9le for surgery. Heme consult ->  Done. CRRT started  3/13 - events from yesterday noted. Down on vasopressor need. Down to levophed 18, neo 50, and vasopresson 0.03. Ongoing CRRT. On 200 fentantyl mcg and RN reports responsiveness on left. Shock liver better. Renal rec dc bic gtt. S/p  PRBC, FFP and platlets  3/14 - off neo, still on levophed 14 and shock dose vasop. Improved ur op but still in oliguric bleed. On LawyerBair hugger  . Shock liver better. Daughte at bedside: still very upset that patient was given IV heparin gtt  3/15 - still on levophed 18 and shock vaso. On even CRRT. 50% fio2. Bair hugger +. Oliguria +. Ur Op dropping. Tolerating TF. SEdated with fent gtt. Moves left per RN  3/16 - inreased levoiphed need to 24mcg, on vaso. Seems to corelated with hydrocort dc. CBC holding.  Anuric - makigg 30cc urine per shift. Renal planning to incrase volume removeal. Singificant 3rd spacing +++/. Tolerating TF. Lorre NickWWUAH - moves left side and bites tube. Off bair hugger.    3/17 - improved levophed afte steroids started. Agitated on left during WUA. Anuric; on CRRT.    SUBJECTIVE/OVERNIGHT/INTERVAL HX 3/18 - getting intolerant to fent gtt with agiation on left side. CT abd - new hypoattenuating lesion Rt lobe liver (new sinc 3/10). Rt side RP Hge ? Same v slightly big - 18 x 25cm (on 3/10 17 x 20cm). Overall down on levophed to 12mcg.   Vital signs: BP 125/77   Pulse (!) 125   Temp (!) 96.8 F (36 C)   Resp 20   Ht 5\' 6"  (1.676 m)   Wt (!) 167 kg (368 lb 2.7 oz)   SpO2 100%   BMI 59.42 kg/m   Intake/output:  Intake/Output Summary (Last 24 hours) at 05/21/16 [id number] Last data filed  at 05/21/16 0900  Gross per 24 hour  Intake           2386.8 ml  Output             3469 ml  Net          -1082.2 ml   I/O last 3 completed shifts: In: 3461.8 [I.V.:3421.8; NG/GT:40] Out: 8006 [Urine:37; Emesis/NG output:3500; Other:4469]  EXAM  General Appearance:    Looks criticall ill OBESE - yes  Head:    Normocephalic, without obvious abnormality, atraumatic  Eyes:    PERRL - yes, conjunctiva/corneas - clear      Ears:    Normal external ear canals, both ears  Nose:   NG tube - no  Throat:  ETT TUBE - yes , OG tube - yes  Neck:   Supple,  No enlargement/tenderness/nodules      Lungs:     Clear to auscultation bilaterally, Ventilator   Synchrony - no with agitatio  Chest wall:    No deformity  Heart:    S1 and S2 normal, no murmur, CVP - no.  Pressors - yes levophed 12, vasopression 0.03  Abdomen:     Soft, no masses, no organomegaly  Genitalia:    Not done  Rectal:   not done  Extremities:   Extremities- edema     Skin:   Intact in exposed areas .      Neurologic:   Sedation - fent gtt -> RASS - +2 . Moves all 4s - left side only. CAM-ICU - positive delirium . Orientation - not oriented       LABS  PULMONARY  Recent Labs Lab 05/14/16 1044 05/15/16 0333 05/15/16 1522 05/16/16 0515 05/16/16 1500 05/17/16 0251  PHART 7.252* 7.283* 7.317* 7.292* 7.373 7.476*  PCO2ART 44.1 40.6 44.0 60.8* 49.0* 36.4  PO2ART 84.0 72.0* 77.2* 71.7* 72.2* 57.0*  HCO3 19.3* 19.3* 21.9 28.5* 27.8 27.0  TCO2 21 21  --   --   --  28  O2SAT 94.0 93.0 94.4 92.8 94.9 92.0    CBC  Recent Labs Lab 05/19/16 0330 05/19/16 1537 05/20/16 0405  HGB 8.0* 8.0* 7.9*  HCT 25.2* 25.7* 25.1*  WBC 18.1* 17.8* 16.3*  PLT 76* 72* 65*    COAGULATION  Recent Labs Lab 05/17/16 0352 05/18/16 0350 05/19/16 0330 05/20/16 0405 05/21/16 0400  INR 1.78 1.52 1.40 1.41 1.36    CARDIAC    Recent Labs Lab 05/15/16 1030 05/15/16 2012 05/16/16 0230  TROPONINI 0.83* 0.81* 0.48*   No results for input(s): PROBNP in the last 168 hours.   CHEMISTRY  Recent Labs Lab 05/17/16 0352  05/18/16 0350 05/18/16 1555 05/19/16 0330 05/19/16 1515 05/20/16 0405 05/20/16 1600 05/21/16 0400  NA  --   < > 136 137 137 135 135 134* 135  K  --   < > 3.9 3.7 4.0 4.1 4.0 4.0 4.3  CL  --   < > 102 101 101 103 104 103 103  CO2  --   < > 28 28 28 27 26 26 28   GLUCOSE  --   < > 132* 162* 138* 163* 121* 132* 125*  BUN  --   < > 33* 35* 35* 33* 34* 29* 27*  CREATININE  --   < > 1.86* 1.82* 1.71* 1.60* 1.71* 1.65* 1.54*  CALCIUM  --   < > 8.0* 7.9* 8.0* 7.9* 8.1* 8.1* 8.2*  MG 2.1  --   2.2  --  2.3  --  2.2  --  2.2  PHOS  --   < > 1.6* 2.1*  --  1.8* 2.4* 2.6 2.4*  < > = values in this interval not displayed. Estimated Creatinine Clearance: 64.5 mL/min (A) (by C-G formula based on SCr of 1.54 mg/dL (H)).   LIVER  Recent Labs Lab 05/15/16 0340  05/16/16 0359  05/17/16 0352  05/18/16 0350  05/19/16 0330 05/19/16 1515 05/20/16 0405 05/20/16 1600 05/21/16 0400  AST 3,667*  --  1,884*  --  1,041*  --   --   --  367*  --  163*  --   --   ALT 2,019*  --  1,414*  --  1,106*  --   --   --  769*  --  515*  --   --   ALKPHOS 104  --  118  --  133*  --   --   --  160*  --  141*  --   --   BILITOT 5.0*  --  5.5*  --  5.8*  --   --   --  5.2*  --  5.6*  --   --   PROT 6.9  --  7.4  --  6.6  --   --   --  6.7  --  6.7  --   --   ALBUMIN 2.9*  --  3.2*  3.1*  < > 2.8*  < > 2.7*  < > 2.6* 2.5* 2.5*  2.5* 2.6* 2.3*  INR 3.93  < > 2.21  --  1.78  --  1.52  --  1.40  --  1.41  --  1.36  < > = values in this interval not displayed.   INFECTIOUS  Recent Labs Lab 05/16/16 0400 05/16/16 2329 05/19/16 0951  LATICACIDVEN 2.0* 1.9 1.4     ENDOCRINE CBG (last 3)   Recent Labs  05/21/16 0020 05/21/16 0420 05/21/16 0758  GLUCAP 127* 110* 128*         IMAGING x48h  - image(s) personally visualized  -   highlighted in bold Ct Abdomen Pelvis Wo Contrast  Result Date: 05/20/2016 CLINICAL DATA:  Retroperitoneal bleeding. EXAM: CT ABDOMEN AND PELVIS WITHOUT CONTRAST TECHNIQUE: Multidetector CT imaging of the abdomen and pelvis was performed following the standard protocol without IV contrast. COMPARISON:  CT abdomen pelvis - 05/13/2016 FINDINGS: The lack of intravenous contrast limits the ability to evaluate solid abdominal organs. Lower chest: Evaluation of lower thorax is degraded secondary to patient respiratory artifact. Limited visualization of lower thorax demonstrates small bilateral effusions with associated bibasilar consolidative opacities and air bronchograms,  right greater than left. Cardiomegaly.  No pericardial effusion. Hepatobiliary: Normal hepatic contour. Interval development of ill-defined hypoattenuating lesion within the medial aspect of the dome of the right lobe of the liver measuring approximately 5.0 x 3.3 x 4.9 cm (axial image 13, series 3, coronal image 86, series 4), incompletely evaluated on this noncontrast examination, though new compared to the 05/13/2016 examination and [place] worrisome for an area of developing infection. Cholelithiasis without evidence of cholecystitis on this noncontrast examination. Trace amount of intra-abdominal ascites is noted about the caudal aspect the right lobe of the liver. Pancreas: Normal noncontrast appearance of the pancreas Spleen: Normal noncontrast appearance of the spleen Adrenals/Urinary Tract: Bilateral hypoattenuating renal lesions the largest right-sided lesion measuring approximately 2.7 cm in diameter (image 39, series 3), incompletely characterize though favored to represent renal cysts. No renal stones. No renal stones are seen  along expected course of either ureter or the urinary bladder. The urinary bladder is decompressed with a Foley catheter. Normal noncontrast appearance of the bilateral adrenal glands. Stomach/Bowel: Moderate colonic stool burden without evidence of enteric obstruction. The bowel is normal in course and caliber without wall thickening. No pneumoperitoneum, pneumatosis or portal venous gas. Vascular/Lymphatic: Minimal amount of atherosclerotic plaque within a normal caliber abdominal aorta. 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 Reproductive: Trace amount of free fluid in the pelvic cul-de-sac. Other: Re- demonstrated known large right-sided retroperitoneal hemorrhage. The hemorrhage again is noted to extends to the level of the  right hemipelvis (image 72, series 3). The hemorrhage is noted to again displace the right kidney medially. Exact measurements are difficult secondary to the irregular shape of the retroperitoneal hemorrhage however dominant component of the hemorrhage appears grossly unchanged measuring approximately 18.4 x 25.6 cm (axial image 55, series 3; coronal image 73, series 8)), previously, 17.4 x 20.2 cm. Hyperattenuating blood products are again seen scattered throughout scattered components of the complex retroperitoneal hemorrhage (representative images 37 and 54, series 3) likely indicative of areas of more recent hemorrhage. No definitive intra peritoneal extension. Large amount of body wall edema. Musculoskeletal: No acute or aggressive osseous abnormalities. 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 IMPRESSION: 1. Grossly unchanged size of large mixed attenuating right-sided retroperitoneal hemorrhage with dominant component measuring approximately 25.6 cm in diameter. No definitive intraperitoneal extension. 2. Interval development of ill-defined hypoattenuating 5 cm lesion within the medial aspect of the dome of the right lobe of the liver - while incompletely evaluated on this noncontrast examination, given rapid development since the 05/2008/2018 examination, this area is worrisome for a hepatic abscess or an intraparenchymal hepatic hemorrhage (unusual unless there is an underlying hepatic lesion which was not demonstrated on prior noncontrast examination). 3. Cholelithiasis without evidence cholecystitis on this noncontrast examination. 4. Similar findings of cardiomegaly, small bilateral effusions and associated bibasilar consolidative opacities worrisome for multifocal infection. 5. Extensive body wall edema. Critical Value/emergent results were called by telephone at the time of interpretation on 05/20/2016  at 11:32 am to Dr. Kalman Shan , who verbally acknowledged these results. Electronically Signed   By: Simonne Come M.D.   On: 05/20/2016 11:48   Dg Chest Port 1 View  Result Date: 05/21/2016 CLINICAL DATA:  Retroperitoneal bleeding. EXAM: PORTABLE CHEST 1 VIEW COMPARISON:  Yesterday. FINDINGS: Endotracheal tube in satisfactory position. Bilateral jugular catheter tips at the origin of the superior vena cava. Nasogastric tube extending into the distal stomach. Stable enlarged cardiac silhouette. Decreased bilateral airspace opacity. Stable small right pleural effusion. IMPRESSION: Improving changes of congestive heart failure with stable cardiomegaly. Electronically Signed   By: Beckie Salts M.D.   On: 05/21/2016 07:23   Dg Chest Port 1 View  Result Date: 05/20/2016 CLINICAL DATA:  Endotracheal tube placement. EXAM: PORTABLE CHEST 1 VIEW COMPARISON:  05/19/2016 FINDINGS: Endotracheal tube has tip 3.8 cm above the carina. Left IJ central venous catheter and right IJ central venous catheters are unchanged. Enteric tube courses into the region of the stomach and off the inferior portion of the film as tip is not visualized. Lungs are adequately inflated with continued evidence of hazy bilateral perihilar and bibasilar opacification likely mild interstitial edema and  possible small bilateral pleural effusions. Stable cardiomegaly. Remainder of the exam is unchanged. IMPRESSION: Stable findings likely representing mild interstitial edema with small bilateral pleural effusions. Mild cardiomegaly. Tubes and lines as described. Electronically Signed   By: Elberta Fortis M.D.   On: 05/20/2016 07:10       ASSESSMENT and PLAN  Acute respiratory failure (HCC) Still does not meett SBT criteria 05/21/2016 LOS 13 as of 05/21/2016 though cxr better. Agitation and shock barrier to extubatino/wean    Plan Full vent support Needs trach given goals of care  Shock circulatory (HCC) Due to massive severe RP  hemorrhage on 05/13/16 No evidence of active bleed Worsening shock 05/19/2016 correlates with dc hydrocort CT abd 3/17 - RP hge same size v slightly bigger since 05/13/16 - new liver lesion+ Improved pressor 05/21/2016 after hydrocort restarted 05/19/16    Plan Pressor support for MAP > 65 Continue hydrocort since 05/19/2016  Consider giaprezza if getting worse pressors investigage liver lesion 05/20/16 when more stable  Retroperitoneal hemorrhage Large on Rt RP hge CT 05/13/16  No role for surgery per CCS 05/15/16 Heme consult noted 05/15/16 - their findings note On  05/20/2016- no evidence of active bleed clinically. CT with similar size RP hge v slightly large . New lesion Rt lobe liver On 05/21/2016 - no active bleeding   Plan Support with blood products as needed Cbc  Wean to q24h Monitor clinically No heparin ever per family    Altered mental status RASS -3 with fentanyl gtt + prn. Can move left side on wua (baseline right weakness) Did nod appropriately 05/18/2016. Some agitation 05/19/2016  On 05/21/2016 - > aigitated on WUA    Plan fent gtt + sedation -.> change to dilaudid gtt + dilaudid prn 05/21/2016   Hemiparesis affecting right side as late effect of cerebrovascular accident [facility]) Old baseline issue  On enteral nutrition Restarted  tube feeds 05/16/16 and tolerating it well as of 05/21/2016  Started miralax 05/18/2016    Goals of care, counseling/discussion Full code. Family very upset at outcome - see 05/14/16 and 05/15/16 High mortality situation and defnitely high morbidity situation  On 05/17/16 - daughter at bedside: very upset that Iv Heparin gtt was given. Says that in 2013 for A Fib stroke admit got IV heparin gtt and bled around heart. Upset to heart hat this is not in record because [facility]. She says other anticoagulants including coumadin fine but not IV heparing ttt. Reports that she informed both at [facility] and [facility] to staff that patient  should never get IV heparin gttt (she did this a priori per her). Upset that we did not take this into account and list in allergy list and still gave IV heparin gtt. REports that staff used words like "Trial and Error".    On 05/19/2016 and 05/20/2016  And 05/21/2016- none at bedside but RN Updated them  Plan Ongoing goals chaplain consult to be offered to family if they are interested Full code  Acute renal failure (ARF) (HCC) Oliguric/anuric 05/15/2016; started CRRT after HD cath placement 05/15/16 (Right IJ) Now 05/19/16 anuric with siginificant anasarca On 05/21/2016 - still anuric  Plan crrt per renal; keeping negative since 05/19/2016    Electrolyte imbalance Per renal  Shock liver Since RP bleed. Improving 05/20/2016  Has new liver lesion 05/20/16 - Might be infarct v infection; too sick to asess  Plan Monitor Check LFT 05/22/16 Consider re-ct ad in a week or so from 05/21/2016  Hypothermia New 05/17/2016 after CRRT Improved with CRRT warmer 05/19/2016  Plan Off bair hugger as oof 05/20/16 CRRT warmer  Paroxysmal atrial fibrillation (HCC) In and out of slow A Fib 05/17/2016  HR 120 05/21/2016 with wua and agitration  Plan monitor  Hypothyroidism contnue synthroid; changed to po 05/17/2016   Current use of proton pump inhibitor cotnnue for sup as of 05/21/2016   Contraindication to deep vein thrombosis (DVT) prophylaxis No heparin or anticoag due to RP bleed Never any heeparin due to family reported hx of unique heparin sensitivity  Plan scd      FAMILY  - Updates: 05/21/2016 --> none at bedside  - Inter-disciplinary family meet or Palliative Care meeting due by:  DAy 7. Current LOS is LOS 13 days  CODE STATUS    Code Status Orders        Start     Ordered   05/09/16 0131  Full code  Continuous     05/09/16 0133    Code Status History    Date Active Date Inactive Code Status Order ID Comments User Context   This patient has a current code  status but no historical code status.        DISPO Keep in ICU       The patient is critically ill with multiple organ systems failure and requires high complexity decision making for assessment and support, frequent evaluation and titration of therapies, application of advanced monitoring technologies and extensive interpretation of multiple databases.   Critical Care Time devoted to patient care services described in this note is  30  Minutes. This time reflects time of care of this signee Dr Kalman Shan. This critical care time does not reflect procedure time, or teaching time or supervisory time of PA/NP/Med student/Med Resident etc but could involve care discussion time    Dr. Kalman Shan, M.D., [facility].C.P Pulmonary and Critical Care Medicine Staff Physician [facility] [place] [facility] Pager: [phone], If no answer or between  15:00h - 7:00h: call [phone]  05/21/2016 9:42 AM

## 2016-05-22 ENCOUNTER — Inpatient Hospital Stay (HOSPITAL_COMMUNITY): Payer: Medicare Other

## 2016-05-22 DIAGNOSIS — K7200 Acute and subacute hepatic failure without coma: Secondary | ICD-10-CM

## 2016-05-22 DIAGNOSIS — K769 Liver disease, unspecified: Secondary | ICD-10-CM

## 2016-05-22 LAB — RENAL FUNCTION PANEL
ALBUMIN: 2.7 g/dL — AB (ref 3.5–5.0)
ANION GAP: 8 (ref 5–15)
Albumin: 2.7 g/dL — ABNORMAL LOW (ref 3.5–5.0)
Anion gap: 8 (ref 5–15)
BUN: 20 mg/dL (ref 6–20)
BUN: 20 mg/dL (ref 6–20)
CALCIUM: 8.5 mg/dL — AB (ref 8.9–10.3)
CALCIUM: 8.5 mg/dL — AB (ref 8.9–10.3)
CHLORIDE: 101 mmol/L (ref 101–111)
CO2: 27 mmol/L (ref 22–32)
CO2: 26 mmol/L (ref 22–32)
CREATININE: 1.42 mg/dL — AB (ref 0.61–1.24)
Chloride: 101 mmol/L (ref 101–111)
Creatinine, Ser: 1.48 mg/dL — ABNORMAL HIGH (ref 0.61–1.24)
GFR calc Af Amer: 53 mL/min — ABNORMAL LOW (ref >60)
GFR calc non Af Amer: 48 mL/min — ABNORMAL LOW (ref >60)
GFR, EST AFRICAN AMERICAN: 55 mL/min — AB (ref >60)
GFR, EST NON AFRICAN AMERICAN: 46 mL/min — AB (ref >60)
GLUCOSE: 125 mg/dL — AB (ref 65–99)
Glucose, Bld: 121 mg/dL — ABNORMAL HIGH (ref 65–99)
PHOSPHORUS: 2.8 mg/dL (ref 2.5–4.6)
PHOSPHORUS: 2.9 mg/dL (ref 2.5–4.6)
POTASSIUM: 3.9 mmol/L (ref 3.5–5.1)
Potassium: 4 mmol/L (ref 3.5–5.1)
SODIUM: 135 mmol/L (ref 135–145)
Sodium: 136 mmol/L (ref 135–145)

## 2016-05-22 LAB — CBC WITH DIFFERENTIAL/PLATELET
BASOS ABS: 0 10*3/uL (ref 0.0–0.1)
Basophils Relative: 0 %
Eosinophils Absolute: 0 10*3/uL (ref 0.0–0.7)
Eosinophils Relative: 0 %
HEMATOCRIT: 25.1 % — AB (ref 39.0–52.0)
Hemoglobin: 8.1 g/dL — ABNORMAL LOW (ref 13.0–17.0)
LYMPHS ABS: 0.5 10*3/uL — AB (ref 0.7–4.0)
Lymphocytes Relative: 6 %
MCH: 32 pg (ref 26.0–34.0)
MCHC: 32.3 g/dL (ref 30.0–36.0)
MCV: 99.2 fL (ref 78.0–100.0)
MONOS PCT: 16 %
Monocytes Absolute: 1.4 10*3/uL — ABNORMAL HIGH (ref 0.1–1.0)
NEUTROS ABS: 6.9 10*3/uL (ref 1.7–7.7)
Neutrophils Relative %: 78 %
PLATELETS: 67 10*3/uL — AB (ref 150–400)
RBC: 2.53 MIL/uL — AB (ref 4.22–5.81)
RDW: 21.4 % — AB (ref 11.5–15.5)
WBC: 8.8 10*3/uL (ref 4.0–10.5)

## 2016-05-22 LAB — GLUCOSE, CAPILLARY
GLUCOSE-CAPILLARY: 106 mg/dL — AB (ref 65–99)
GLUCOSE-CAPILLARY: 111 mg/dL — AB (ref 65–99)
GLUCOSE-CAPILLARY: 115 mg/dL — AB (ref 65–99)
GLUCOSE-CAPILLARY: 120 mg/dL — AB (ref 65–99)
Glucose-Capillary: 111 mg/dL — ABNORMAL HIGH (ref 65–99)
Glucose-Capillary: 132 mg/dL — ABNORMAL HIGH (ref 65–99)
Glucose-Capillary: 132 mg/dL — ABNORMAL HIGH (ref 65–99)

## 2016-05-22 LAB — MAGNESIUM: Magnesium: 2.3 mg/dL (ref 1.7–2.4)

## 2016-05-22 LAB — BASIC METABOLIC PANEL WITH GFR
Anion gap: 7 (ref 5–15)
BUN: 22 mg/dL — ABNORMAL HIGH (ref 6–20)
CO2: 27 mmol/L (ref 22–32)
Calcium: 8.3 mg/dL — ABNORMAL LOW (ref 8.9–10.3)
Chloride: 101 mmol/L (ref 101–111)
Creatinine, Ser: 1.43 mg/dL — ABNORMAL HIGH (ref 0.61–1.24)
GFR calc Af Amer: 55 mL/min — ABNORMAL LOW
GFR calc non Af Amer: 47 mL/min — ABNORMAL LOW
Glucose, Bld: 123 mg/dL — ABNORMAL HIGH (ref 65–99)
Potassium: 4.3 mmol/L (ref 3.5–5.1)
Sodium: 135 mmol/L (ref 135–145)

## 2016-05-22 LAB — HEPATIC FUNCTION PANEL
ALBUMIN: 2.7 g/dL — AB (ref 3.5–5.0)
ALT: 276 U/L — ABNORMAL HIGH (ref 17–63)
AST: 69 U/L — AB (ref 15–41)
Alkaline Phosphatase: 129 U/L — ABNORMAL HIGH (ref 38–126)
BILIRUBIN TOTAL: 6.2 mg/dL — AB (ref 0.3–1.2)
Bilirubin, Direct: 3.5 mg/dL — ABNORMAL HIGH (ref 0.1–0.5)
Indirect Bilirubin: 2.7 mg/dL — ABNORMAL HIGH (ref 0.3–0.9)
Total Protein: 7 g/dL (ref 6.5–8.1)

## 2016-05-22 LAB — TYPE AND SCREEN
ABO/RH(D): B POS
Antibody Screen: NEGATIVE
UNIT DIVISION: 0

## 2016-05-22 LAB — BPAM RBC
BLOOD PRODUCT EXPIRATION DATE: 201804102359
ISSUE DATE / TIME: 201803181438
UNIT TYPE AND RH: 7300

## 2016-05-22 LAB — PROTIME-INR
INR: 1.28
Prothrombin Time: 16.1 seconds — ABNORMAL HIGH (ref 11.4–15.2)

## 2016-05-22 MED ORDER — DARBEPOETIN ALFA 150 MCG/0.3ML IJ SOSY
150.0000 ug | PREFILLED_SYRINGE | INTRAMUSCULAR | Status: DC
  Administered 2016-05-22 – 2016-06-05 (×3): 150 ug via SUBCUTANEOUS
  Filled 2016-05-22 (×3): qty 0.3

## 2016-05-22 MED ORDER — LEVOTHYROXINE SODIUM 25 MCG PO TABS
125.0000 ug | ORAL_TABLET | Freq: Every day | ORAL | Status: DC
  Administered 2016-05-23 – 2016-06-20 (×27): 125 ug
  Filled 2016-05-22 (×31): qty 1

## 2016-05-22 MED ORDER — HYDROCORTISONE NA SUCCINATE PF 100 MG IJ SOLR
50.0000 mg | Freq: Four times a day (QID) | INTRAMUSCULAR | Status: DC
  Administered 2016-05-22 – 2016-05-30 (×31): 50 mg via INTRAVENOUS
  Filled 2016-05-22 (×4): qty 1
  Filled 2016-05-22: qty 2
  Filled 2016-05-22 (×2): qty 1
  Filled 2016-05-22: qty 2
  Filled 2016-05-22 (×26): qty 1

## 2016-05-22 NOTE — Assessment & Plan Note (Signed)
5cm lesion dome of liver noted on CT scan 3/18. Significance unclear but could represent blood or infxn. May need to consider GI consult, IR consult for possible sampling. Believe he is too unstable at this time to sample it. Will follow, if pressor needs not resolving off abx may need to consider empiric coverage for hepatic abscess.

## 2016-05-22 NOTE — Progress Notes (Signed)
Nutrition Follow-up  DOCUMENTATION CODES:   Morbid obesity  INTERVENTION:    Continue trickle TF with Vital High Protein at 20 ml/h and Pro-stat 30 ml QID  When able to advance, goal is Vital High Protein at 50 ml/h (1200 ml per day) with Pro-stat 30 ml QID  NUTRITION DIAGNOSIS:   Inadequate oral intake related to inability to eat as evidenced by NPO status.  Ongoing  GOAL:   Provide needs based on ASPEN/SCCM guidelines  Progressing  MONITOR:   Vent status, TF tolerance, I & O's, Labs  ASSESSMENT:   71 y/o male from [facility] admitted 3/5 with altered mental status with aspiration, sepsis, hypoxic/hypercapnic respiratory failure.    Discussed patient in ICU rounds and with RN today. Retroperitoneal bleed is stabilizing. Extubated 3/8, re-intubated 3/10. May have trach placed later this week by ENT. Remains on CRRT. TF held on 3/16 due to increased OGT output (2200 ml, 600 ml). Trickle TF has been resumed; currently receiving Vital High Protein at 20 ml/h with Prostat 30 ml QID providing 880 kcal, 102 gm protein, 401 ml free water daily. Patient is currently intubated on ventilator support Temp (24hrs), Avg:96.7 F (35.9 C), Min:93.6 F (34.2 C), Max:98.3 F (36.8 C)  Propofol: none Labs reviewed. Medications reviewed.  Diet Order:  Diet NPO time specified  Skin:  Reviewed, no issues  Last BM:  3/11  Height:   Ht Readings from Last 1 Encounters:  05/09/16 5\' 6"  (1.676 m)    Weight:   Wt Readings from Last 1 Encounters:  05/22/16 (!) 360 lb (163.3 kg)    Ideal Body Weight:  64.5 kg  BMI:  Body mass index is 58.11 kg/m.  Estimated Nutritional Needs:   Kcal:  [id number]  Protein:  161 gm  Fluid:  2-2.5 L  EDUCATION NEEDS:   No education needs identified at this time  Joaquin CourtsKimberly Harris, RD, LDN, CNSC Pager [phone] After Hours Pager [phone]

## 2016-05-22 NOTE — Progress Notes (Signed)
Patient ID: Omar [name], male   DOB: 09-01-1945, 71 y.o.   MRN: [id number]  [facility] Progress Note   Assessment/ Plan:   1. Acute kidney injury: Likely ATN from hemorrhagic shock. He remains anuric and without any evidence of renal recovery- on CRRT since 3/12.  Significantly volume overloaded on physical exam and will continue current CRRT prescription with ultrafiltration at net -50 mL per hour- continue effort to increase to -100 mL per hour if possible- not yet due to hypotension and FI02 of 40.  Baseline kidney function relatively normal 2. Hemorrhagic shock from acute retroperitoneal bleed: Remains on pressors with limited ability to wean off.  3. Acute blood loss anemia secondary to retroperitoneal hemorrhage: Hemoglobin/hematocrit down yest s/p  Transfusion of one unit. Will add aranesp.  No systemic heparin with CRRT  4. Elevated liver enzymes: Secondary to what appears to be most consistent with shock liver from hemorrhagic shock- transaminases trending down. 5. [place]- stable on all 4 K dialysate  Subjective:   No significant change overnight- remains hypotensive on pressors- got one unit of blood yesterday- no mechanical issues with machine   Objective:   BP 106/68   Pulse 77   Temp (!) 96.5 F (35.8 C) (Axillary)   Resp 20   Ht '5\' 6"'  (1.676 m)   Wt (!) 163.3 kg (360 lb)   SpO2 98%   BMI 58.11 kg/m   Intake/Output Summary (Last 24 hours) at 05/22/16 0734 Last data filed at 05/22/16 0700  Gross per 24 hour  Intake          2423.93 ml  Output             5466 ml  Net         -3042.07 ml   Weight change: -3.705 kg (-8 lb 2.7 oz)  Physical Exam: JHE:[id number], sedated, morbidly obese man.  Right IJ vascath placed 3/12  CVS: Pulse regular rhythm, Normal rate, S1 and S2 normal Resp: Diminished breath sounds bilaterally-no distinct rales or rhonchi Abd: Soft, obese, nontender Ext: 2-3+ upper and lower extremity edema, scrotal edema  noted  Imaging: Ct Abdomen Pelvis Wo Contrast  Result Date: 05/20/2016 CLINICAL DATA:  Retroperitoneal bleeding. EXAM: CT ABDOMEN AND PELVIS WITHOUT CONTRAST TECHNIQUE: Multidetector CT imaging of the abdomen and pelvis was performed following the standard protocol without IV contrast. COMPARISON:  CT abdomen pelvis - 05/13/2016 FINDINGS: The lack of intravenous contrast limits the ability to evaluate solid abdominal organs. Lower chest: Evaluation of lower thorax is degraded secondary to patient respiratory artifact. Limited visualization of lower thorax demonstrates small bilateral effusions with associated bibasilar consolidative opacities and air bronchograms, right greater than left. Cardiomegaly.  No pericardial effusion. Hepatobiliary: Normal hepatic contour. Interval development of ill-defined hypoattenuating lesion within the medial aspect of the dome of the right lobe of the liver measuring approximately 5.0 x 3.3 x 4.9 cm (axial image 13, series 3, coronal image 86, series 4), incompletely evaluated on this noncontrast examination, though new compared to the 05/13/2016 examination and [place] worrisome for an area of developing infection. Cholelithiasis without evidence of cholecystitis on this noncontrast examination. Trace amount of intra-abdominal ascites is noted about the caudal aspect the right lobe of the liver. Pancreas: Normal noncontrast appearance of the pancreas Spleen: Normal noncontrast appearance of the spleen Adrenals/Urinary Tract: Bilateral hypoattenuating renal lesions the largest right-sided lesion measuring approximately 2.7 cm in diameter (image 39, series 3), incompletely characterize though favored to represent renal cysts. No renal stones.  No renal stones are seen along expected course of either ureter or the urinary bladder. The urinary bladder is decompressed with a Foley catheter. Normal noncontrast appearance of the bilateral adrenal glands. Stomach/Bowel: Moderate colonic  stool burden without evidence of enteric obstruction. The bowel is normal in course and caliber without wall thickening. No pneumoperitoneum, pneumatosis or portal venous gas. Vascular/Lymphatic: Minimal amount of atherosclerotic plaque within a normal caliber abdominal aorta. 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 Reproductive: Trace amount of free fluid in the pelvic cul-de-sac. Other: Re- demonstrated known large right-sided retroperitoneal hemorrhage. The hemorrhage again is noted to extends to the level of the right hemipelvis (image 72, series 3). The hemorrhage is noted to again displace the right kidney medially. Exact measurements are difficult secondary to the irregular shape of the retroperitoneal hemorrhage however dominant component of the hemorrhage appears grossly unchanged measuring approximately 18.4 x 25.6 cm (axial image 55, series 3; coronal image 73, series 8)), previously, 17.4 x 20.2 cm. Hyperattenuating blood products are again seen scattered throughout scattered components of the complex retroperitoneal hemorrhage (representative images 37 and 54, series 3) likely indicative of areas of more recent hemorrhage. No definitive intra peritoneal extension. Large amount of body wall edema. Musculoskeletal: No acute or aggressive osseous abnormalities. 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 IMPRESSION: 1. Grossly unchanged size of large mixed attenuating right-sided retroperitoneal hemorrhage with dominant component measuring approximately 25.6 cm in diameter. No definitive intraperitoneal extension. 2. Interval development of ill-defined hypoattenuating 5 cm lesion within the medial aspect of the dome of the right lobe of the liver -  while incompletely evaluated on this noncontrast examination, given rapid development since the 05/2008/2018 examination, this area is worrisome for a hepatic abscess or an intraparenchymal hepatic hemorrhage (unusual unless there is an underlying hepatic lesion which was not demonstrated on prior noncontrast examination). 3. Cholelithiasis without evidence cholecystitis on this noncontrast examination. 4. Similar findings of cardiomegaly, small bilateral effusions and associated bibasilar consolidative opacities worrisome for multifocal infection. 5. Extensive body wall edema. Critical Value/emergent results were called by telephone at the time of interpretation on 05/20/2016 at 11:32 am to Dr. Brand Males , who verbally acknowledged these results. Electronically Signed   By: Sandi Mariscal M.D.   On: 05/20/2016 11:48   Dg Chest Port 1 View  Result Date: 05/22/2016 CLINICAL DATA:  Intubation. EXAM: PORTABLE CHEST 1 VIEW COMPARISON:  05/21/2016. FINDINGS: Endotracheal tube, bilateral IJ lines, NG tube in stable position. Cardiomegaly with diffuse bilateral pulmonary infiltrates consistent pulmonary edema. Small bilateral pleural effusions. No pneumothorax . IMPRESSION: 1. Lines and tubes in stable position. 2. Cardiomegaly with diffuse bilateral pulmonary infiltrates and small pleural effusions consistent CHF . Electronically Signed   By: Marcello Moores  Register   On: 05/22/2016 06:49   Dg Chest Port 1 View  Result Date: 05/21/2016 CLINICAL DATA:  Retroperitoneal bleeding. EXAM: PORTABLE CHEST 1 VIEW COMPARISON:  Yesterday. FINDINGS: Endotracheal tube in satisfactory position. Bilateral jugular catheter tips at the origin of the superior vena cava. Nasogastric tube extending into the distal stomach. Stable enlarged cardiac silhouette. Decreased bilateral airspace opacity. Stable small right pleural effusion. IMPRESSION: Improving changes of congestive heart failure with stable cardiomegaly. Electronically Signed    By: Claudie Revering M.D.   On:  05/21/2016 07:23    Labs: BMET  Recent Labs Lab 05/18/16 1555  05/19/16 1515 05/20/16 0405 05/20/16 1600 05/21/16 0400 05/21/16 1901 05/22/16 0001 05/22/16 0430  NA 137  < > 135 135 134* 135 133* 135 136  K 3.7  < > 4.1 4.0 4.0 4.3 4.1 4.3 4.0  CL 101  < > 103 104 103 103 99* 101 101  CO2 28  < > '27 26 26 28 25 27 27  ' GLUCOSE 162*  < > 163* 121* 132* 125* 125* 123* 121*  BUN 35*  < > 33* 34* 29* 27* 24* 22* 20  CREATININE 1.82*  < > 1.60* 1.71* 1.65* 1.54* 1.51* 1.43* 1.42*  CALCIUM 7.9*  < > 7.9* 8.1* 8.1* 8.2* 8.4* 8.3* 8.5*  PHOS 2.1*  --  1.8* 2.4* 2.6 2.4* 2.8  --  2.8  < > = values in this interval not displayed. CBC  Recent Labs Lab 05/17/16 0352  05/18/16 0350  05/19/16 0330  05/20/16 0405 05/21/16 0929 05/21/16 1938 05/22/16 0430  WBC 11.8*  < > 14.5*  < > 18.1*  < > 16.3* 11.1* 10.0 8.8  NEUTROABS 10.5*  --  12.6*  --  14.8*  --   --   --   --  6.9  HGB 7.2*  < > 7.7*  < > 8.0*  < > 7.9* 6.8* 8.0* 8.1*  HCT 22.0*  < > 23.9*  < > 25.2*  < > 25.1* 21.9* 25.1* 25.1*  MCV 92.4  < > 95.6  < > 97.3  < > 98.8 99.5 98.8 99.2  PLT 96*  < > 78*  < > 76*  < > 65* 74* 72* 67*  < > = values in this interval not displayed.  Medications:    . sodium chloride   Intravenous Once  . chlorhexidine gluconate (MEDLINE KIT)  15 mL Mouth Rinse BID  . Chlorhexidine Gluconate Cloth  6 each Topical Daily  . feeding supplement (PRO-STAT SUGAR FREE 64)  30 mL Per Tube QID  . hydrocortisone sod succinate (SOLU-CORTEF) inj  50 mg Intravenous Q8H  . insulin aspart  0-9 Units Subcutaneous Q4H  . levothyroxine  125 mcg Oral QAC breakfast  . mouth rinse  15 mL Mouth Rinse QID  . pantoprazole sodium  40 mg Per Tube Daily  . polyethylene glycol  17 g Oral Daily   Jacynda Brunke A  05/22/2016, 7:34 AM

## 2016-05-22 NOTE — Progress Notes (Signed)
PCCM Progress Note  Admission date: 05/07/2016 Referring provider: Dr. Clarene Duke, [facility]  CC: altered mental status  HPI: 71 y/o male from [facility] admitted 3/5 with altered mental status with aspiration, sepsis, hypoxic/hypercapnic respiratory failure. Course c/b retroperitoneal bleed, shock, acute renal failure   has a past medical history of Asthma; Atrial fibrillation (HCC); Gout; Hypertension; Morbid obesity (HCC); Multinodular goiter; Osteoarthrosis and allied disorders; Other and unspecified hyperlipidemia; Stroke [facility]); Thrombocytopenia (HCC); and Vitamin D deficiency.   Antibiotics: Zosyn 3/06 >> 3/08 Rocephin (H.Flu PNA) 3/08 >> 3/14  Cultures: Urine 3/05 >> negative Blood 3/06 >> negative Resp 3/6 >> H flu (b-lactamase +)   Lines/tubes: ETT 3/05 >> 3/8 ETT 3/10 >>  Lt IJ CVL 3/07 >>   Events: 3/05  Transfer to [facility] Sputum 3/06 >> Haemophilus influenzae (beta lactamase positive) Echo 3/06 >> EF 65 to 70%, mod LVH, mod TR, PAS 39 mmHg CT head 3/7 >> no acute abnormality   3/08  extubated off pressors. No longer meeting sirs criteria  3/10  Reintubated, concern for bleed with drop in Hgb on heparin gtt, resp distress, AF w RVR.   CT ABD/Pelvis 3/10 >> large right retroperitoneal hematoma, small bilateral pleural effusions with extensive consolidation/atelectasis 3/11  Received 4 FFP overnight, 2 units PRBC  3/11  Remains on high dose vasopressors, CT c/w retroperitoneal bleed  RN reports hypotension on levo + neo.  FFP infusing.   3/12 - max pressors. Shock liver. Deeply sedated. S/p 4 units prbc and 4 unit ffp as of last night -> got 1 unit prbc, platlelet   And FFP x 5 -> HD cath placed . Famiily very angry at outcome of RP hemorrhage. Reports he is ssensitive to heparin based on 2013 experience. CCS -> no ro9le for surgery. Heme consult ->  Done. CRRT started  3/13 - events from yesterday noted. Down on vasopressor need. Down to levophed 18, neo 50, and vasopresson 0.03.  Ongoing CRRT. On 200 fentantyl mcg and RN reports responsiveness on left. Shock liver better. Renal rec dc bic gtt. S/p PRBC, FFP and platlets  3/14 - off neo, still on levophed 14 and shock dose vasop. Improved ur op but still in oliguric bleed. On Lawyer  . Shock liver better. Daughte at bedside: still very upset that patient was given IV heparin gtt  3/15 - still on levophed 18 and shock vaso. On even CRRT. 50% fio2. Bair hugger +. Oliguria +. Ur Op dropping. Tolerating TF. SEdated with fent gtt. Moves left per RN  3/16 - inreased levoiphed need to , on vaso. Seems to corelated with hydrocort dc. CBC holding.  Anuric - makigg 30cc urine per shift. Renal planning to incrase volume removeal. Singificant 3rd spacing +++/. Tolerating TF. Lorre Nick - moves left side and bites tube. Off bair hugger.   3/17 - improved levophed afte steroids started. Agitated on left during WUA. Anuric; on CRRT.    SUBJECTIVE/OVERNIGHT/INTERVAL HX Changed to dilaudid 3/18 Pressor needs improving Tolerating volume removal   Vital signs: BP 103/78   Pulse (!) 115   Temp (!) 96.5 F (35.8 C) (Axillary)   Resp (!) 22   Ht  (1.676 m)   Wt (!) 163.3 kg (360 lb)   SpO2 97%   BMI 58.11 kg/m   Intake/output:  Intake/Output Summary (Last 24 hours) at 05/22/16 1031 Last data filed at 05/22/16 1000  Gross per 24 hour  Intake          2293.83 ml  Output             5316 ml  Net         -3022.17 ml   I/O last 3 completed shifts: In: 3661.5 [I.V.:2956.5; Blood:605; NG/GT:100] Out: 7853 [Urine:75; Emesis/NG output:1250; Other:6528]  EXAM Gen: obese man, ill appearing, on MV, CVVHD  ENT: ETT in place, facial and eye edema, Op clear,   Neck: large, JVD unable to assess,   Lungs: coarse bilaterally, distant bases.   Cardiovascular: regular, tachy, distant,   Abdomen: obese, soft, hypoactive BS  Musculoskeletal: No deformities, trace pretibial edema, SCD in place  Neuro: sedated, tries to  open eyes to stim, moving spontaneously but non purposeful  Skin: Warm, no rash seen     LABS  PULMONARY  Recent Labs Lab 05/15/16 1522 05/16/16 0515 05/16/16 1500 05/17/16 0251  PHART 7.317* 7.292* 7.373 7.476*  PCO2ART 44.0 60.8* 49.0* 36.4  PO2ART 77.2* 71.7* 72.2* 57.0*  HCO3 21.9 28.5* 27.8 27.0  TCO2  --   --   --  28  O2SAT 94.4 92.8 94.9 92.0    CBC  Recent Labs Lab 05/21/16 0929 05/21/16 1938 05/22/16 0430  HGB 6.8* 8.0* 8.1*  HCT 21.9* 25.1* 25.1*  WBC 11.1* 10.0 8.8  PLT 74* 72* 67*    COAGULATION  Recent Labs Lab 05/18/16 0350 05/19/16 0330 05/20/16 0405 05/21/16 0400 05/22/16 0430  INR 1.52 1.40 1.41 1.36 1.28    CARDIAC    Recent Labs Lab 05/15/16 2012 05/16/16 0230  TROPONINI 0.81* 0.48*   No results for input(s): PROBNP in the last 168 hours.   CHEMISTRY  Recent Labs Lab 05/18/16 0350  05/19/16 0330  05/20/16 0405 05/20/16 1600 05/21/16 0400 05/21/16 1901 05/22/16 0001 05/22/16 0430  NA 136  < > 137  < > 135 134* 135 133* 135 136  K 3.9  < > 4.0  < > 4.0 4.0 4.3 4.1 4.3 4.0  CL 102  < > 101  < > 104 103 103 99* 101 101  CO2 28  < > 28  < > 26 26 28 25 27 27   GLUCOSE 132*  < > 138*  < > 121* 132* 125* 125* 123* 121*  BUN 33*  < > 35*  < > 34* 29* 27* 24* 22* 20  CREATININE 1.86*  < > 1.71*  < > 1.71* 1.65* 1.54* 1.51* 1.43* 1.42*  CALCIUM 8.0*  < > 8.0*  < > 8.1* 8.1* 8.2* 8.4* 8.3* 8.5*  MG 2.2  --  2.3  --  2.2  --  2.2  --   --  2.3  PHOS 1.6*  < >  --   < > 2.4* 2.6 2.4* 2.8  --  2.8  < > = values in this interval not displayed. Estimated Creatinine Clearance: 68.9 mL/min (A) (by C-G formula based on SCr of 1.42 mg/dL (H)).   LIVER  Recent Labs Lab 05/16/16 0359  05/17/16 0352  05/18/16 0350  05/19/16 0330  05/20/16 0405 05/20/16 1600 05/21/16 0400 05/21/16 1901 05/22/16 0430  AST 1,884*  --  1,041*  --   --   --  367*  --  163*  --   --   --  69*  ALT 1,414*  --  1,106*  --   --   --  769*  --   515*  --   --   --  276*  ALKPHOS 118  --  133*  --   --   --  160*  --  141*  --   --   --  129*  BILITOT 5.5*  --  5.8*  --   --   --  5.2*  --  5.6*  --   --   --  6.2*  PROT 7.4  --  6.6  --   --   --  6.7  --  6.7  --   --   --  7.0  ALBUMIN 3.2*  3.1*  < > 2.8*  < > 2.7*  < > 2.6*  < > 2.5*  2.5* 2.6* 2.3* 2.7* 2.7*  2.7*  INR 2.21  --  1.78  --  1.52  --  1.40  --  1.41  --  1.36  --  1.28  < > = values in this interval not displayed.   INFECTIOUS  Recent Labs Lab 05/16/16 0400 05/16/16 2329 05/19/16 0951  LATICACIDVEN 2.0* 1.9 1.4     ENDOCRINE CBG (last 3)   Recent Labs  05/22/16 0014 05/22/16 0428 05/22/16 0720  GLUCAP 111* 115* 111*         IMAGING x48h  - image(s) personally visualized  -   highlighted in bold Ct Abdomen Pelvis Wo Contrast  Result Date: 05/20/2016 CLINICAL DATA:  Retroperitoneal bleeding. EXAM: CT ABDOMEN AND PELVIS WITHOUT CONTRAST TECHNIQUE: Multidetector CT imaging of the abdomen and pelvis was performed following the standard protocol without IV contrast. COMPARISON:  CT abdomen pelvis - 05/13/2016 FINDINGS: The lack of intravenous contrast limits the ability to evaluate solid abdominal organs. Lower chest: Evaluation of lower thorax is degraded secondary to patient respiratory artifact. Limited visualization of lower thorax demonstrates small bilateral effusions with associated bibasilar consolidative opacities and air bronchograms, right greater than left. Cardiomegaly.  No pericardial effusion. Hepatobiliary: Normal hepatic contour. Interval development of ill-defined hypoattenuating lesion within the medial aspect of the dome of the right lobe of the liver measuring approximately 5.0 x 3.3 x 4.9 cm (axial image 13, series 3, coronal image 86, series 4), incompletely evaluated on this noncontrast examination, though new compared to the 05/13/2016 examination and [place] worrisome for an area of developing infection. Cholelithiasis without  evidence of cholecystitis on this noncontrast examination. Trace amount of intra-abdominal ascites is noted about the caudal aspect the right lobe of the liver. Pancreas: Normal noncontrast appearance of the pancreas Spleen: Normal noncontrast appearance of the spleen Adrenals/Urinary Tract: Bilateral hypoattenuating renal lesions the largest right-sided lesion measuring approximately 2.7 cm in diameter (image 39, series 3), incompletely characterize though favored to represent renal cysts. No renal stones. No renal stones are seen along expected course of either ureter or the urinary bladder. The urinary bladder is decompressed with a Foley catheter. Normal noncontrast appearance of the bilateral adrenal glands. Stomach/Bowel: Moderate colonic stool burden without evidence of enteric obstruction. The bowel is normal in course and caliber without wall thickening. No pneumoperitoneum, pneumatosis or portal venous gas. Vascular/Lymphatic: Minimal amount of atherosclerotic plaque within a normal caliber abdominal aorta. 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 Reproductive: Trace amount of free fluid in the pelvic cul-de-sac. Other: Re- demonstrated known large right-sided retroperitoneal hemorrhage. The hemorrhage again is noted to extends to the level of the right hemipelvis (image 72, series 3). The hemorrhage is noted to again displace the right kidney medially. Exact measurements are difficult secondary to the  irregular shape of the retroperitoneal hemorrhage however dominant component of the hemorrhage appears grossly unchanged measuring approximately 18.4 x 25.6 cm (axial image 55, series 3; coronal image 73, series 8)), previously, 17.4 x 20.2 cm. Hyperattenuating blood products are again seen scattered throughout scattered components of the complex  retroperitoneal hemorrhage (representative images 37 and 54, series 3) likely indicative of areas of more recent hemorrhage. No definitive intra peritoneal extension. Large amount of body wall edema. Musculoskeletal: No acute or aggressive osseous abnormalities. 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 IMPRESSION: 1. Grossly unchanged size of large mixed attenuating right-sided retroperitoneal hemorrhage with dominant component measuring approximately 25.6 cm in diameter. No definitive intraperitoneal extension. 2. Interval development of ill-defined hypoattenuating 5 cm lesion within the medial aspect of the dome of the right lobe of the liver - while incompletely evaluated on this noncontrast examination, given rapid development since the 05/2008/2018 examination, this area is worrisome for a hepatic abscess or an intraparenchymal hepatic hemorrhage (unusual unless there is an underlying hepatic lesion which was not demonstrated on prior noncontrast examination). 3. Cholelithiasis without evidence cholecystitis on this noncontrast examination. 4. Similar findings of cardiomegaly, small bilateral effusions and associated bibasilar consolidative opacities worrisome for multifocal infection. 5. Extensive body wall edema. Critical Value/emergent results were called by telephone at the time of interpretation on 05/20/2016 at 11:32 am to Dr. Kalman Shan , who verbally acknowledged these results. Electronically Signed   By: Simonne Come M.D.   On: 05/20/2016 11:48   Dg Chest Port 1 View  Result Date: 05/22/2016 CLINICAL DATA:  Intubation. EXAM: PORTABLE CHEST 1 VIEW COMPARISON:  05/21/2016. FINDINGS: Endotracheal tube, bilateral IJ lines, NG tube in stable position. Cardiomegaly with diffuse bilateral pulmonary infiltrates consistent pulmonary edema. Small bilateral pleural effusions. No pneumothorax .  IMPRESSION: 1. Lines and tubes in stable position. 2. Cardiomegaly with diffuse bilateral pulmonary infiltrates and small pleural effusions consistent CHF . Electronically Signed   By: Maisie Fus  Register   On: 05/22/2016 06:49   Dg Chest Port 1 View  Result Date: 05/21/2016 CLINICAL DATA:  Retroperitoneal bleeding. EXAM: PORTABLE CHEST 1 VIEW COMPARISON:  Yesterday. FINDINGS: Endotracheal tube in satisfactory position. Bilateral jugular catheter tips at the origin of the superior vena cava. Nasogastric tube extending into the distal stomach. Stable enlarged cardiac silhouette. Decreased bilateral airspace opacity. Stable small right pleural effusion. IMPRESSION: Improving changes of congestive heart failure with stable cardiomegaly. Electronically Signed   By: Beckie Salts M.D.   On: 05/21/2016 07:23       ASSESSMENT and PLAN  Acute respiratory failure (HCC) Due PNA and then acute decompensation in setting hemorrhagic shock.  Push PSv as he can tolerate. MS precludes extubation at this time Discussions have been undertaken with his family - would want trach. Will consult ENT to perform   Shock circulatory [facility]) Large retroperitoneal bleed, note slight enlargement CT 3/18 ? Component adrenal insufficiency Continue current pressors and wean as able goal MAP > 65  Retroperitoneal hemorrhage Large RP bleed noted, slightly larger on serial Ct scan 3/18 but appears to be stabilizing based on CBC. Note pt has apparent hypersensitivity to heparin - avoid. Follow CBC   Altered mental status RASS -3 currently on dilaudid.  Continue current sedation / pain control.   Hemiparesis affecting right side as late effect of cerebrovascular accident [facility]) Hx CVA. Will likely influence ability to wean  On enteral nutrition High  residuals, continue trickle TF without advancing for now.    Goals of care, counseling/discussion Full code. Family very upset at outcome - see 05/14/16 and 05/15/16 High  mortality situation and defnitely high morbidity situation  On 05/17/16 - daughter at bedside: very upset that Iv Heparin gtt was given. Says that in 2013 for A Fib stroke admit got IV heparin gtt and bled around heart. Upset to heart hat this is not in record because [facility] was [facility]. She says other anticoagulants including coumadin fine but not IV heparing ttt. Reports that she informed both at [facility] and [facility] to staff that patient should never get IV heparin gttt (she did this a priori per her). Upset that we did not take this into account and list in allergy list and still gave IV heparin gtt. REports that staff used words like "Trial and Error".    On 05/19/2016 and 05/20/2016  And 05/21/2016- none at bedside but RN Updated them  Plan Will continue to update and support family.   Acute renal failure (ARF) (HCC) Presumed due to ATN, hemorrhagic shock. Starting to make some amber urine. On CVVHD with 100cc/h removed.    Electrolyte imbalance Replete electrolytes as indicated.   Shock liver Following LFt, normalizing.  ? 5cm liver lesion due to infarct / blood   Hypothermia New 05/17/2016 after CRRT Improved with CRRT warmer 05/19/2016  Plan Off bair hugger as oof 05/20/16 CRRT warmer  Paroxysmal atrial fibrillation (HCC) Atrial fibrillation, HR 120-130's 3/19 May need to consider rate control, amiodarone. Suspect discomfort playing a role here.  Will need to address anticoagulation once stable from RP bleed  Hypothyroidism Synthroid per tube  Current use of proton pump inhibitor For acid prophylaxis  Contraindication to deep vein thrombosis (DVT) prophylaxis No heparin or anticoag due to RP bleed Never any heeparin due to family reported hx of unique heparin sensitivity Continue SCD's  Hepatic lesion 5cm lesion dome of liver noted on CT scan 3/18. Significance unclear but could represent blood or infxn. May need to consider GI consult, IR consult for possible  sampling. Believe he is too unstable at this time to sample it. Will follow, if pressor needs not resolving off abx may need to consider empiric coverage for hepatic abscess.       FAMILY  - Updates: no family at bedside 3/19  - Inter-disciplinary family meet or Palliative Care meeting due by:  Ongoing   CODE STATUS   DISPO Keep in ICU    Independent CC time 40 minutes  Levy Pupa, MD, PhD 05/22/2016, 10:32 AM [facility] [phone] or if no answer [phone]

## 2016-05-22 NOTE — Care Management Note (Addendum)
Case Management Note  Patient Details  Name: Ignacia FellingWilliam R Holdman MRN: [id number] Date of Birth: [birth year]  Subjective/Objective: Pt admitted for acute resp failure - on ventilator                   Action/Plan:  PTA from home with daughter and grandkids.  Pt is wheelchair bound at home.  CM will continue to follow for discharge needs   Expected Discharge Date:                  Expected Discharge Plan:  Home/Self Care  In-House Referral:     Discharge planning Services  CM Consult  Post Acute Care Choice:    Choice offered to:     DME Arranged:    DME Agency:     HH Arranged:    HH Agency:     Status of Service:  In process, will continue to follow  If discussed at Long Length of Stay Meetings, dates discussed:    Additional Comments: 05/22/2016  Pt remains ventilated - not weaning well, remains on CRRT.  Plan is for possible trach this week   05/18/16 Discussed in LOS 05/18/16 - remains appropriate for continued stay.  Remains on ventilator Cherylann ParrClaxton, Elayah Klooster S, RN 05/22/2016, 2:15 PM

## 2016-05-23 ENCOUNTER — Inpatient Hospital Stay (HOSPITAL_COMMUNITY): Payer: Medicare Other

## 2016-05-23 ENCOUNTER — Encounter (HOSPITAL_COMMUNITY): Payer: Self-pay | Admitting: Emergency Medicine

## 2016-05-23 DIAGNOSIS — I480 Paroxysmal atrial fibrillation: Secondary | ICD-10-CM

## 2016-05-23 LAB — RENAL FUNCTION PANEL
ANION GAP: 7 (ref 5–15)
Albumin: 2.8 g/dL — ABNORMAL LOW (ref 3.5–5.0)
Albumin: 2.8 g/dL — ABNORMAL LOW (ref 3.5–5.0)
Anion gap: 7 (ref 5–15)
BUN: 18 mg/dL (ref 6–20)
BUN: 16 mg/dL (ref 6–20)
CO2: 27 mmol/L (ref 22–32)
CO2: 26 mmol/L (ref 22–32)
Calcium: 8.6 mg/dL — ABNORMAL LOW (ref 8.9–10.3)
Calcium: 8.5 mg/dL — ABNORMAL LOW (ref 8.9–10.3)
Chloride: 100 mmol/L — ABNORMAL LOW (ref 101–111)
Chloride: 102 mmol/L (ref 101–111)
Creatinine, Ser: 1.4 mg/dL — ABNORMAL HIGH (ref 0.61–1.24)
Creatinine, Ser: 1.36 mg/dL — ABNORMAL HIGH (ref 0.61–1.24)
GFR calc Af Amer: 56 mL/min — ABNORMAL LOW (ref >60)
GFR calc Af Amer: 58 mL/min — ABNORMAL LOW (ref >60)
GFR calc non Af Amer: 49 mL/min — ABNORMAL LOW (ref >60)
GFR calc non Af Amer: 50 mL/min — ABNORMAL LOW (ref >60)
Glucose, Bld: 125 mg/dL — ABNORMAL HIGH (ref 65–99)
Glucose, Bld: 128 mg/dL — ABNORMAL HIGH (ref 65–99)
POTASSIUM: 3.9 mmol/L (ref 3.5–5.1)
Phosphorus: 2.8 mg/dL (ref 2.5–4.6)
Phosphorus: 2.4 mg/dL — ABNORMAL LOW (ref 2.5–4.6)
Potassium: 3.9 mmol/L (ref 3.5–5.1)
Sodium: 134 mmol/L — ABNORMAL LOW (ref 135–145)
Sodium: 135 mmol/L (ref 135–145)

## 2016-05-23 LAB — CBC WITH DIFFERENTIAL/PLATELET
BASOS PCT: 0 %
Basophils Absolute: 0 10*3/uL (ref 0.0–0.1)
EOS PCT: 0 %
Eosinophils Absolute: 0 10*3/uL (ref 0.0–0.7)
HEMATOCRIT: 25.4 % — AB (ref 39.0–52.0)
Hemoglobin: 8 g/dL — ABNORMAL LOW (ref 13.0–17.0)
LYMPHS ABS: 0.3 10*3/uL — AB (ref 0.7–4.0)
Lymphocytes Relative: 3 %
MCH: 31.5 pg (ref 26.0–34.0)
MCHC: 31.5 g/dL (ref 30.0–36.0)
MCV: 100 fL (ref 78.0–100.0)
MONO ABS: 1.4 10*3/uL — AB (ref 0.1–1.0)
MONOS PCT: 17 %
NEUTROS ABS: 6.7 10*3/uL (ref 1.7–7.7)
Neutrophils Relative %: 80 %
Platelets: 69 10*3/uL — ABNORMAL LOW (ref 150–400)
RBC: 2.54 MIL/uL — AB (ref 4.22–5.81)
RDW: 22.7 % — AB (ref 11.5–15.5)
WBC: 8.4 10*3/uL (ref 4.0–10.5)

## 2016-05-23 LAB — GLUCOSE, CAPILLARY
GLUCOSE-CAPILLARY: 116 mg/dL — AB (ref 65–99)
GLUCOSE-CAPILLARY: 119 mg/dL — AB (ref 65–99)
GLUCOSE-CAPILLARY: 127 mg/dL — AB (ref 65–99)
GLUCOSE-CAPILLARY: 142 mg/dL — AB (ref 65–99)
Glucose-Capillary: 117 mg/dL — ABNORMAL HIGH (ref 65–99)
Glucose-Capillary: 119 mg/dL — ABNORMAL HIGH (ref 65–99)

## 2016-05-23 LAB — HEPATIC FUNCTION PANEL
ALT: 225 U/L — ABNORMAL HIGH (ref 17–63)
AST: 56 U/L — ABNORMAL HIGH (ref 15–41)
Albumin: 2.8 g/dL — ABNORMAL LOW (ref 3.5–5.0)
Alkaline Phosphatase: 121 U/L (ref 38–126)
Bilirubin, Direct: 3.3 mg/dL — ABNORMAL HIGH (ref 0.1–0.5)
Indirect Bilirubin: 2.9 mg/dL — ABNORMAL HIGH (ref 0.3–0.9)
Total Bilirubin: 6.2 mg/dL — ABNORMAL HIGH (ref 0.3–1.2)
Total Protein: 7.1 g/dL (ref 6.5–8.1)

## 2016-05-23 LAB — MAGNESIUM: Magnesium: 2.3 mg/dL (ref 1.7–2.4)

## 2016-05-23 LAB — PROTIME-INR
INR: 1.37
Prothrombin Time: 16.9 seconds — ABNORMAL HIGH (ref 11.4–15.2)

## 2016-05-23 NOTE — Consult Note (Signed)
Reason for Consult: Respiratory failure Referring Physician: CCM  CLAYVON PARLETT is an 71 y.o. male.  HPI: 71 year old male with multiple medical problems was admitted 3/6 due to altered mental status and was found to be hypoxic and hypercapnic and was intubated.  He required pressors for hypotension initially and was treated for sepsis.  He was anticoagulated with heparin for atrial fibrillation.  He was able to be extubated 3/8 and treated with BiPAP but required reintubation on 3/10 due to worsening hypercapnia and was found to have a large retroperitoneal hemorrhage requiring transfusions and resumption of pressors.  He had to be started on dialysis.  Since that time, he has stabilized but has been unable to wean from the ventilator.  Tracheostomy is requested.  Past Medical History:  Diagnosis Date  . Asthma   . Atrial fibrillation ([place])   . Gout   . Hypertension   . Morbid obesity ([place])   . Multinodular goiter   . Osteoarthrosis and allied disorders   . Other and unspecified hyperlipidemia   . Stroke [facility])    right sided hemiparesis  . Thrombocytopenia ([place])   . Vitamin D deficiency     Past Surgical History:  Procedure Laterality Date  . THYROID SURGERY      Family History  Problem Relation Age of Onset  . Coronary artery disease Brother     Social History:  reports that he has quit smoking. He has never used smokeless tobacco. He reports that he does not drink alcohol or use drugs.  Allergies:  Allergies  Allergen Reactions  . Anacin Af [Acetaminophen]   . Heparin     Retroperitoneal bleed in 2018, previous bleed from heparin in 2013  . Niaspan [Niacin Er]     Medications: I have reviewed the patient's current medications.  Results for orders placed or performed during the hospital encounter of 06/02/2016 (from the past 48 hour(s))  Renal function panel (daily at 1600)     Status: Abnormal   Collection Time: 05/21/16  7:01 PM  Result Value Ref Range   Sodium 133  (L) 135 - 145 mmol/L   Potassium 4.1 3.5 - 5.1 mmol/L   Chloride 99 (L) 101 - 111 mmol/L   CO2 25 22 - 32 mmol/L   Glucose, Bld 125 (H) 65 - 99 mg/dL   BUN 24 (H) 6 - 20 mg/dL   Creatinine, Ser 1.51 (H) 0.61 - 1.24 mg/dL   Calcium 8.4 (L) 8.9 - 10.3 mg/dL   Phosphorus 2.8 2.5 - 4.6 mg/dL   Albumin 2.7 (L) 3.5 - 5.0 g/dL   GFR calc non Af Amer 44 (L) >60 mL/min   GFR calc Af Amer 51 (L) >60 mL/min    Comment: (NOTE) The eGFR has been calculated using the CKD EPI equation. This calculation has not been validated in all clinical situations. eGFR's persistently <60 mL/min signify possible Chronic Kidney Disease.    Anion gap 9 5 - 15  CBC     Status: Abnormal   Collection Time: 05/21/16  7:38 PM  Result Value Ref Range   WBC 10.0 4.0 - 10.5 K/uL   RBC 2.54 (L) 4.22 - 5.81 MIL/uL   Hemoglobin 8.0 (L) 13.0 - 17.0 g/dL   HCT 25.1 (L) 39.0 - 52.0 %   MCV 98.8 78.0 - 100.0 fL   MCH 31.5 26.0 - 34.0 pg   MCHC 31.9 30.0 - 36.0 g/dL   RDW 20.4 (H) 11.5 - 15.5 %  Platelets 72 (L) 150 - 400 K/uL    Comment: CONSISTENT WITH PREVIOUS RESULT  Glucose, capillary     Status: Abnormal   Collection Time: 05/21/16  8:08 PM  Result Value Ref Range   Glucose-Capillary 121 (H) 65 - 99 mg/dL   Comment 1 Notify RN   Basic metabolic panel     Status: Abnormal   Collection Time: 05/22/16 12:01 AM  Result Value Ref Range   Sodium 135 135 - 145 mmol/L   Potassium 4.3 3.5 - 5.1 mmol/L   Chloride 101 101 - 111 mmol/L   CO2 27 22 - 32 mmol/L   Glucose, Bld 123 (H) 65 - 99 mg/dL   BUN 22 (H) 6 - 20 mg/dL   Creatinine, Ser 1.43 (H) 0.61 - 1.24 mg/dL   Calcium 8.3 (L) 8.9 - 10.3 mg/dL   GFR calc non Af Amer 47 (L) >60 mL/min   GFR calc Af Amer 55 (L) >60 mL/min    Comment: (NOTE) The eGFR has been calculated using the CKD EPI equation. This calculation has not been validated in all clinical situations. eGFR's persistently <60 mL/min signify possible Chronic Kidney Disease.    Anion gap 7 5 - 15   Glucose, capillary     Status: Abnormal   Collection Time: 05/22/16 12:14 AM  Result Value Ref Range   Glucose-Capillary 111 (H) 65 - 99 mg/dL   Comment 1 Notify RN   Glucose, capillary     Status: Abnormal   Collection Time: 05/22/16  4:28 AM  Result Value Ref Range   Glucose-Capillary 115 (H) 65 - 99 mg/dL   Comment 1 Document in Chart   Protime-INR     Status: Abnormal   Collection Time: 05/22/16  4:30 AM  Result Value Ref Range   Prothrombin Time 16.1 (H) 11.4 - 15.2 seconds   INR 1.28   Renal function panel (daily at 0500)     Status: Abnormal   Collection Time: 05/22/16  4:30 AM  Result Value Ref Range   Sodium 136 135 - 145 mmol/L   Potassium 4.0 3.5 - 5.1 mmol/L   Chloride 101 101 - 111 mmol/L   CO2 27 22 - 32 mmol/L   Glucose, Bld 121 (H) 65 - 99 mg/dL   BUN 20 6 - 20 mg/dL   Creatinine, Ser 1.42 (H) 0.61 - 1.24 mg/dL   Calcium 8.5 (L) 8.9 - 10.3 mg/dL   Phosphorus 2.8 2.5 - 4.6 mg/dL   Albumin 2.7 (L) 3.5 - 5.0 g/dL   GFR calc non Af Amer 48 (L) >60 mL/min   GFR calc Af Amer 55 (L) >60 mL/min    Comment: (NOTE) The eGFR has been calculated using the CKD EPI equation. This calculation has not been validated in all clinical situations. eGFR's persistently <60 mL/min signify possible Chronic Kidney Disease.    Anion gap 8 5 - 15  Magnesium     Status: None   Collection Time: 05/22/16  4:30 AM  Result Value Ref Range   Magnesium 2.3 1.7 - 2.4 mg/dL  CBC with Differential/Platelet     Status: Abnormal   Collection Time: 05/22/16  4:30 AM  Result Value Ref Range   WBC 8.8 4.0 - 10.5 K/uL   RBC 2.53 (L) 4.22 - 5.81 MIL/uL   Hemoglobin 8.1 (L) 13.0 - 17.0 g/dL   HCT 25.1 (L) 39.0 - 52.0 %   MCV 99.2 78.0 - 100.0 fL   MCH 32.0 26.0 - 34.0  pg   MCHC 32.3 30.0 - 36.0 g/dL   RDW 21.4 (H) 11.5 - 15.5 %   Platelets 67 (L) 150 - 400 K/uL    Comment: CONSISTENT WITH PREVIOUS RESULT   Neutrophils Relative % 78 %   Lymphocytes Relative 6 %   Monocytes Relative 16 %    Eosinophils Relative 0 %   Basophils Relative 0 %   Neutro Abs 6.9 1.7 - 7.7 K/uL   Lymphs Abs 0.5 (L) 0.7 - 4.0 K/uL   Monocytes Absolute 1.4 (H) 0.1 - 1.0 K/uL   Eosinophils Absolute 0.0 0.0 - 0.7 K/uL   Basophils Absolute 0.0 0.0 - 0.1 K/uL   RBC Morphology POLYCHROMASIA PRESENT     Comment: RARE NRBCs  Hepatic function panel     Status: Abnormal   Collection Time: 05/22/16  4:30 AM  Result Value Ref Range   Total Protein 7.0 6.5 - 8.1 g/dL   Albumin 2.7 (L) 3.5 - 5.0 g/dL   AST 69 (H) 15 - 41 U/L   ALT 276 (H) 17 - 63 U/L   Alkaline Phosphatase 129 (H) 38 - 126 U/L   Total Bilirubin 6.2 (H) 0.3 - 1.2 mg/dL   Bilirubin, Direct 3.5 (H) 0.1 - 0.5 mg/dL   Indirect Bilirubin 2.7 (H) 0.3 - 0.9 mg/dL  Glucose, capillary     Status: Abnormal   Collection Time: 05/22/16  7:20 AM  Result Value Ref Range   Glucose-Capillary 111 (H) 65 - 99 mg/dL   Comment 1 Notify RN    Comment 2 Document in Chart   Glucose, capillary     Status: Abnormal   Collection Time: 05/22/16 11:20 AM  Result Value Ref Range   Glucose-Capillary 106 (H) 65 - 99 mg/dL   Comment 1 Notify RN    Comment 2 Document in Chart   Glucose, capillary     Status: Abnormal   Collection Time: 05/22/16  3:00 PM  Result Value Ref Range   Glucose-Capillary 120 (H) 65 - 99 mg/dL   Comment 1 Notify RN    Comment 2 Document in Chart   Renal function panel (daily at 1600)     Status: Abnormal   Collection Time: 05/22/16  4:00 PM  Result Value Ref Range   Sodium 135 135 - 145 mmol/L   Potassium 3.9 3.5 - 5.1 mmol/L   Chloride 101 101 - 111 mmol/L   CO2 26 22 - 32 mmol/L   Glucose, Bld 125 (H) 65 - 99 mg/dL   BUN 20 6 - 20 mg/dL   Creatinine, Ser 1.48 (H) 0.61 - 1.24 mg/dL   Calcium 8.5 (L) 8.9 - 10.3 mg/dL   Phosphorus 2.9 2.5 - 4.6 mg/dL   Albumin 2.7 (L) 3.5 - 5.0 g/dL   GFR calc non Af Amer 46 (L) >60 mL/min   GFR calc Af Amer 53 (L) >60 mL/min    Comment: (NOTE) The eGFR has been calculated using the CKD EPI  equation. This calculation has not been validated in all clinical situations. eGFR's persistently <60 mL/min signify possible Chronic Kidney Disease.    Anion gap 8 5 - 15  Glucose, capillary     Status: Abnormal   Collection Time: 05/22/16  7:34 PM  Result Value Ref Range   Glucose-Capillary 132 (H) 65 - 99 mg/dL   Comment 1 Notify RN    Comment 2 Document in Chart   Glucose, capillary     Status: Abnormal   Collection Time: 05/22/16  11:19 PM  Result Value Ref Range   Glucose-Capillary 132 (H) 65 - 99 mg/dL   Comment 1 Notify RN    Comment 2 Document in Chart   Glucose, capillary     Status: Abnormal   Collection Time: 05/23/16  3:05 AM  Result Value Ref Range   Glucose-Capillary 127 (H) 65 - 99 mg/dL   Comment 1 Notify RN    Comment 2 Document in Chart   Protime-INR     Status: Abnormal   Collection Time: 05/23/16  4:15 AM  Result Value Ref Range   Prothrombin Time 16.9 (H) 11.4 - 15.2 seconds   INR 1.37   Magnesium     Status: None   Collection Time: 05/23/16  4:15 AM  Result Value Ref Range   Magnesium 2.3 1.7 - 2.4 mg/dL  CBC with Differential/Platelet     Status: Abnormal   Collection Time: 05/23/16  4:15 AM  Result Value Ref Range   WBC 8.4 4.0 - 10.5 K/uL   RBC 2.54 (L) 4.22 - 5.81 MIL/uL   Hemoglobin 8.0 (L) 13.0 - 17.0 g/dL   HCT 25.4 (L) 39.0 - 52.0 %   MCV 100.0 78.0 - 100.0 fL   MCH 31.5 26.0 - 34.0 pg   MCHC 31.5 30.0 - 36.0 g/dL   RDW 22.7 (H) 11.5 - 15.5 %   Platelets 69 (L) 150 - 400 K/uL    Comment: REPEATED TO VERIFY CONSISTENT WITH PREVIOUS RESULT    Neutrophils Relative % 80 %   Lymphocytes Relative 3 %   Monocytes Relative 17 %   Eosinophils Relative 0 %   Basophils Relative 0 %   Neutro Abs 6.7 1.7 - 7.7 K/uL   Lymphs Abs 0.3 (L) 0.7 - 4.0 K/uL   Monocytes Absolute 1.4 (H) 0.1 - 1.0 K/uL   Eosinophils Absolute 0.0 0.0 - 0.7 K/uL   Basophils Absolute 0.0 0.0 - 0.1 K/uL   RBC Morphology RARE NRBCs     Comment: POLYCHROMASIA  PRESENT BASOPHILIC STIPPLING   Hepatic function panel     Status: Abnormal   Collection Time: 05/23/16  4:15 AM  Result Value Ref Range   Total Protein 7.1 6.5 - 8.1 g/dL   Albumin 2.8 (L) 3.5 - 5.0 g/dL   AST 56 (H) 15 - 41 U/L   ALT 225 (H) 17 - 63 U/L   Alkaline Phosphatase 121 38 - 126 U/L   Total Bilirubin 6.2 (H) 0.3 - 1.2 mg/dL   Bilirubin, Direct 3.3 (H) 0.1 - 0.5 mg/dL   Indirect Bilirubin 2.9 (H) 0.3 - 0.9 mg/dL  Renal function panel (daily at 0500)     Status: Abnormal   Collection Time: 05/23/16  4:16 AM  Result Value Ref Range   Sodium 134 (L) 135 - 145 mmol/L   Potassium 3.9 3.5 - 5.1 mmol/L   Chloride 100 (L) 101 - 111 mmol/L   CO2 27 22 - 32 mmol/L   Glucose, Bld 125 (H) 65 - 99 mg/dL   BUN 18 6 - 20 mg/dL   Creatinine, Ser 1.40 (H) 0.61 - 1.24 mg/dL   Calcium 8.6 (L) 8.9 - 10.3 mg/dL   Phosphorus 2.8 2.5 - 4.6 mg/dL   Albumin 2.8 (L) 3.5 - 5.0 g/dL   GFR calc non Af Amer 49 (L) >60 mL/min   GFR calc Af Amer 56 (L) >60 mL/min    Comment: (NOTE) The eGFR has been calculated using the CKD EPI equation. This calculation has  not been validated in all clinical situations. eGFR's persistently <60 mL/min signify possible Chronic Kidney Disease.    Anion gap 7 5 - 15  Glucose, capillary     Status: Abnormal   Collection Time: 05/23/16  7:34 AM  Result Value Ref Range   Glucose-Capillary 116 (H) 65 - 99 mg/dL   Comment 1 Notify RN   Glucose, capillary     Status: Abnormal   Collection Time: 05/23/16 11:49 AM  Result Value Ref Range   Glucose-Capillary 119 (H) 65 - 99 mg/dL   Comment 1 Document in Chart   Renal function panel (daily at 1600)     Status: Abnormal   Collection Time: 05/23/16  3:15 PM  Result Value Ref Range   Sodium 135 135 - 145 mmol/L   Potassium 3.9 3.5 - 5.1 mmol/L   Chloride 102 101 - 111 mmol/L   CO2 26 22 - 32 mmol/L   Glucose, Bld 128 (H) 65 - 99 mg/dL   BUN 16 6 - 20 mg/dL   Creatinine, Ser 1.36 (H) 0.61 - 1.24 mg/dL   Calcium 8.5  (L) 8.9 - 10.3 mg/dL   Phosphorus 2.4 (L) 2.5 - 4.6 mg/dL   Albumin 2.8 (L) 3.5 - 5.0 g/dL   GFR calc non Af Amer 50 (L) >60 mL/min   GFR calc Af Amer 58 (L) >60 mL/min    Comment: (NOTE) The eGFR has been calculated using the CKD EPI equation. This calculation has not been validated in all clinical situations. eGFR's persistently <60 mL/min signify possible Chronic Kidney Disease.    Anion gap 7 5 - 15  Glucose, capillary     Status: Abnormal   Collection Time: 05/23/16  3:16 PM  Result Value Ref Range   Glucose-Capillary 117 (H) 65 - 99 mg/dL   Comment 1 Document in Chart     Dg Chest Port 1 View  Result Date: 05/23/2016 CLINICAL DATA:  Acute respiratory failure. EXAM: PORTABLE CHEST 1 VIEW COMPARISON:  Radiograph of May 22, 2016. FINDINGS: Endotracheal and nasogastric tubes are unchanged in position. Stable position of bilateral jugular catheters. No pneumothorax is noted. Mildly increased perihilar and basilar opacities are noted concerning for worsening edema or infiltrates. Bony thorax is unremarkable. IMPRESSION: Stable support apparatus. Mildly increased bilateral lung opacities as described above. Electronically Signed   By: Marijo Conception, M.D.   On: 05/23/2016 07:41   Dg Chest Port 1 View  Result Date: 05/22/2016 CLINICAL DATA:  Intubation. EXAM: PORTABLE CHEST 1 VIEW COMPARISON:  05/21/2016. FINDINGS: Endotracheal tube, bilateral IJ lines, NG tube in stable position. Cardiomegaly with diffuse bilateral pulmonary infiltrates consistent pulmonary edema. Small bilateral pleural effusions. No pneumothorax . IMPRESSION: 1. Lines and tubes in stable position. 2. Cardiomegaly with diffuse bilateral pulmonary infiltrates and small pleural effusions consistent CHF . Electronically Signed   By: Marcello Moores  Register   On: 05/22/2016 06:49    Review of Systems  Unable to perform ROS: Intubated   Blood pressure (!) 118/50, pulse (!) 114, temperature 98 F (36.7 C), temperature source  Axillary, resp. rate 16, height '5\' 6"'  (1.676 m), weight (!) 357 lb (161.9 kg), SpO2 100 %. Physical Exam  Constitutional: He appears well-developed and well-nourished.  Intubated and sedated.  Obese.  HENT:  Head: Normocephalic and atraumatic.  Right Ear: External ear normal.  Left Ear: External ear normal.  Nose: Nose normal.  Neck:  Normal landmarks.  Neck not obese.  Cardiovascular: Normal rate.   Respiratory:  Mechanically  ventilated.  Neurological:  Sedated.  Skin: Skin is warm and dry.  Psychiatric:  Sedated.    Assessment/Plan: Respiratory failure Will plan tracheostomy.  Tentatively planned for tomorrow pending anesthesia assessment.  He remains on pressors.  I discussed surgery with his family including risks, benefits, and alternatives.  Daphine Loch 05/23/2016, 4:26 PM

## 2016-05-23 NOTE — Care Management Note (Signed)
Case Management Note  Patient Details  Name: Omar FellingWilliam R [name] MRN: [id number] Date of Birth: [birth year]  Subjective/Objective: Pt admitted for acute resp failure - on ventilator                   Action/Plan:  PTA from home with daughter and grandkids.  Pt is wheelchair bound at home.  CM will continue to follow for discharge needs   Expected Discharge Date:                  Expected Discharge Plan:  Home/Self Care  In-House Referral:     Discharge planning Services  CM Consult  Post Acute Care Choice:    Choice offered to:     DME Arranged:    DME Agency:     HH Arranged:    HH Agency:     Status of Service:  In process, will continue to follow  If discussed at Long Length of Stay Meetings, dates discussed:    Additional Comments: 05/23/2016  Discussed in LOS 05/23/16 - remains appropriate for continued stay   05/22/16  Pt remains ventilated - not weaning well, remains on CRRT.  Plan is for possible trach this week  05/18/16 Discussed in LOS 05/18/16 - remains appropriate for continued stay.  Remains on ventilator Cherylann ParrClaxton, Pearlie Nies S, RN 05/23/2016, 2:34 PM

## 2016-05-23 NOTE — Progress Notes (Signed)
Patient ID: Omar [name], male   DOB: January 23, 1946, 71 y.o.   MRN: [id number]  [facility] Progress Note   Assessment/ Plan:   1. Acute kidney injury: Likely ATN from hemorrhagic shock. He remains anuric and without any evidence of renal recovery- on CRRT since 3/12.  Significantly volume overloaded on physical exam and will continue current CRRT prescription with ultrafiltration at net -50 mL per hour- continue effort to increase to -100 mL per hour if possible- not yet due to hypotension and FI02 of 40.  Baseline kidney function relatively normal 2. Hemorrhagic shock from acute retroperitoneal bleed: Remains on pressors with limited ability to wean off but making progress 3. Acute blood loss anemia secondary to retroperitoneal hemorrhage: Hemoglobin/hematocrit stable.  Transfusion of one unit 3/19. Have added aranesp.  No systemic heparin with CRRT  4. Elevated liver enzymes: Secondary to what appears to be most consistent with shock liver from hemorrhagic shock- transaminases trending down. 5. Elytes- stable on all 4 K dialysate  Subjective:   No significant change overnight- remains hypotensive on pressors-  no mechanical issues with machine- no UOP- 2 liters negative with CRRT    Objective:   BP (!) 120/45   Pulse (!) 109   Temp 97.6 F (36.4 C) (Oral)   Resp (!) 26   Ht '5\' 6"'  (1.676 m)   Wt (!) 161.9 kg (357 lb)   SpO2 97%   BMI 57.62 kg/m   Intake/Output Summary (Last 24 hours) at 05/23/16 0801 Last data filed at 05/23/16 0700  Gross per 24 hour  Intake            864.2 ml  Output             2858 ml  Net          -1993.8 ml   Weight change: -1.361 kg (-3 lb)  Physical Exam: [id number], sedated, morbidly obese man.  Right IJ vascath placed 3/12  CVS: Pulse regular rhythm, Normal rate, S1 and S2 normal Resp: Diminished breath sounds bilaterally-no distinct rales or rhonchi Abd: Soft, obese, nontender Ext: 2-3+ upper and lower extremity edema, scrotal  edema noted  Imaging: Dg Chest Port 1 View  Result Date: 05/23/2016 CLINICAL DATA:  Acute respiratory failure. EXAM: PORTABLE CHEST 1 VIEW COMPARISON:  Radiograph of May 22, 2016. FINDINGS: Endotracheal and nasogastric tubes are unchanged in position. Stable position of bilateral jugular catheters. No pneumothorax is noted. Mildly increased perihilar and basilar opacities are noted concerning for worsening edema or infiltrates. Bony thorax is unremarkable. IMPRESSION: Stable support apparatus. Mildly increased bilateral lung opacities as described above. Electronically Signed   By: Marijo Conception, M.D.   On: 05/23/2016 07:41   Dg Chest Port 1 View  Result Date: 05/22/2016 CLINICAL DATA:  Intubation. EXAM: PORTABLE CHEST 1 VIEW COMPARISON:  05/21/2016. FINDINGS: Endotracheal tube, bilateral IJ lines, NG tube in stable position. Cardiomegaly with diffuse bilateral pulmonary infiltrates consistent pulmonary edema. Small bilateral pleural effusions. No pneumothorax . IMPRESSION: 1. Lines and tubes in stable position. 2. Cardiomegaly with diffuse bilateral pulmonary infiltrates and small pleural effusions consistent CHF . Electronically Signed   By: Marcello Moores  Register   On: 05/22/2016 06:49    Labs: BMET  Recent Labs Lab 05/20/16 0405 05/20/16 1600 05/21/16 0400 05/21/16 1901 05/22/16 0001 05/22/16 0430 05/22/16 1600 05/23/16 0416  NA 135 134* 135 133* 135 136 135 134*  K 4.0 4.0 4.3 4.1 4.3 4.0 3.9 3.9  CL 104 103 103 99*  101 101 101 100*  CO2 '26 26 28 25 27 27 26 27  ' GLUCOSE 121* 132* 125* 125* 123* 121* 125* 125*  BUN 34* 29* 27* 24* 22* '20 20 18  ' CREATININE 1.71* 1.65* 1.54* 1.51* 1.43* 1.42* 1.48* 1.40*  CALCIUM 8.1* 8.1* 8.2* 8.4* 8.3* 8.5* 8.5* 8.6*  PHOS 2.4* 2.6 2.4* 2.8  --  2.8 2.9 2.8   CBC  Recent Labs Lab 05/18/16 0350  05/19/16 0330  05/21/16 0929 05/21/16 1938 05/22/16 0430 05/23/16 0415  WBC 14.5*  < > 18.1*  < > 11.1* 10.0 8.8 8.4  NEUTROABS 12.6*  --   14.8*  --   --   --  6.9 6.7  HGB 7.7*  < > 8.0*  < > 6.8* 8.0* 8.1* 8.0*  HCT 23.9*  < > 25.2*  < > 21.9* 25.1* 25.1* 25.4*  MCV 95.6  < > 97.3  < > 99.5 98.8 99.2 100.0  PLT 78*  < > 76*  < > 74* 72* 67* 69*  < > = values in this interval not displayed.  Medications:    . sodium chloride   Intravenous Once  . chlorhexidine gluconate (MEDLINE KIT)  15 mL Mouth Rinse BID  . Chlorhexidine Gluconate Cloth  6 each Topical Daily  . darbepoetin (ARANESP) injection - NON-DIALYSIS  150 mcg Subcutaneous Q Mon-1800  . feeding supplement (PRO-STAT SUGAR FREE 64)  30 mL Per Tube QID  . hydrocortisone sod succinate (SOLU-CORTEF) inj  50 mg Intravenous Q6H  . insulin aspart  0-9 Units Subcutaneous Q4H  . levothyroxine  125 mcg Per Tube QAC breakfast  . mouth rinse  15 mL Mouth Rinse QID  . pantoprazole sodium  40 mg Per Tube Daily  . polyethylene glycol  17 g Oral Daily   Devaris Quirk A  05/23/2016, 8:01 AM

## 2016-05-23 NOTE — Progress Notes (Signed)
PCCM Progress Note  Admission date: 2016/05/21 Referring provider: Dr. Clarene Duke, [facility] ER  CC: altered mental status  HPI: 71 y/o male from [facility] admitted 3/5 with altered mental status with aspiration, sepsis, hypoxic/hypercapnic respiratory failure. Course c/b retroperitoneal bleed, shock, acute renal failure   has a past medical history of Asthma; Atrial fibrillation (HCC); Gout; Hypertension; Morbid obesity (HCC); Multinodular goiter; Osteoarthrosis and allied disorders; Other and unspecified hyperlipidemia; Stroke [facility]); Thrombocytopenia (HCC); and Vitamin D deficiency.   Antibiotics: Zosyn 3/06 >> 3/08 Rocephin (H.Flu PNA) 3/08 >> 3/14  Cultures: Urine 3/05 >> negative Blood 3/06 >> negative Resp 3/6 >> H flu (b-lactamase +)   Lines/tubes: ETT 3/05 >> 3/8 ETT 3/10 >>  Lt IJ CVL 3/07 >>   Events: 3/05  Transfer to [facility] Sputum 3/06 >> Haemophilus influenzae (beta lactamase positive) Echo 3/06 >> EF 65 to 70%, mod LVH, mod TR, PAS 39 mmHg CT head 3/7 >> no acute abnormality   3/08  extubated off pressors. No longer meeting sirs criteria  3/10  Reintubated, concern for bleed with drop in Hgb on heparin gtt, resp distress, AF w RVR.   CT ABD/Pelvis 3/10 >> large right retroperitoneal hematoma, small bilateral pleural effusions with extensive consolidation/atelectasis 3/11  Received 4 FFP overnight, 2 units PRBC  3/11  Remains on high dose vasopressors, CT c/w retroperitoneal bleed  RN reports hypotension on levo + neo.  FFP infusing.   3/12 - max pressors. Shock liver. Deeply sedated. S/p 4 units prbc and 4 unit ffp as of last night -> got 1 unit prbc, platlelet   And FFP x 5 -> HD cath placed . Famiily very angry at outcome of RP hemorrhage. Reports he is ssensitive to heparin based on 2013 experience. CCS -> no ro9le for surgery. Heme consult ->  Done. CRRT started  3/13 - events from yesterday noted. Down on vasopressor need. Down to levophed 18, neo 50, and vasopresson 0.03.  Ongoing CRRT. On 200 fentantyl mcg and RN reports responsiveness on left. Shock liver better. Renal rec dc bic gtt. S/p PRBC, FFP and platlets  3/14 - off neo, still on levophed 14 and shock dose vasop. Improved ur op but still in oliguric bleed. On Lawyer  . Shock liver better. Daughte at bedside: still very upset that patient was given IV heparin gtt  3/15 - still on levophed 18 and shock vaso. On even CRRT. 50% fio2. Bair hugger +. Oliguria +. Ur Op dropping. Tolerating TF. SEdated with fent gtt. Moves left per RN  3/16 - inreased levoiphed need to , on vaso. Seems to corelated with hydrocort dc. CBC holding.  Anuric - makigg 30cc urine per shift. Renal planning to incrase volume removeal. Singificant 3rd spacing +++/. Tolerating TF. Lorre Nick - moves left side and bites tube. Off bair hugger.   3/17 - improved levophed afte steroids started. Agitated on left during WUA. Anuric; on CRRT.    SUBJECTIVE/OVERNIGHT/INTERVAL HX Changed to dilaudid 3/18 Pressor needs improving Tolerating volume removal   Vital signs: BP (!) 127/57 (BP Location: Right Arm)   Pulse (!) 101   Temp 98.4 F (36.9 C) (Oral)   Resp 20   Ht 5\' 6"  (1.676 m)   Wt (!) 161.9 kg (357 lb)   SpO2 97%   BMI 57.62 kg/m   Intake/output:  Intake/Output Summary (Last 24 hours) at 05/23/16 1013 Last data filed at 05/23/16 0900  Gross per 24 hour  Intake  868.6 ml  Output             2819 ml  Net          -1950.4 ml   I/O last 3 completed shifts: In: 1873 [I.V.:1473; NG/GT:400] Out: 6012 [Urine:20; Emesis/NG output:750; Other:5242]  EXAM Gen: obese man, ill appearing, on MV, CVVHD  ENT: ETT in place, facial and eye edema, Op clear,   Neck: large, JVD unable to assess,   Lungs: coarse bilaterally, distant bases.   Cardiovascular: regular, tachy, distant,   Abdomen: obese, soft, hypoactive BS  Musculoskeletal: No deformities, trace pretibial edema, SCD in place  Neuro: sedated, tries to  open eyes to stim, moving spontaneously but non purposeful  Skin: Warm, no rash seen     LABS  PULMONARY  Recent Labs Lab 05/16/16 1500 05/17/16 0251  PHART 7.373 7.476*  PCO2ART 49.0* 36.4  PO2ART 72.2* 57.0*  HCO3 27.8 27.0  TCO2  --  28  O2SAT 94.9 92.0    CBC  Recent Labs Lab 05/21/16 1938 05/22/16 0430 05/23/16 0415  HGB 8.0* 8.1* 8.0*  HCT 25.1* 25.1* 25.4*  WBC 10.0 8.8 8.4  PLT 72* 67* 69*    COAGULATION  Recent Labs Lab 05/19/16 0330 05/20/16 0405 05/21/16 0400 05/22/16 0430 05/23/16 0415  INR 1.40 1.41 1.36 1.28 1.37    CARDIAC   No results for input(s): TROPONINI in the last 168 hours. No results for input(s): PROBNP in the last 168 hours.   CHEMISTRY  Recent Labs Lab 05/19/16 0330  05/20/16 0405  05/21/16 0400 05/21/16 1901 05/22/16 0001 05/22/16 0430 05/22/16 1600 05/23/16 0415 05/23/16 0416  NA 137  < > 135  < > 135 133* 135 136 135  --  134*  K 4.0  < > 4.0  < > 4.3 4.1 4.3 4.0 3.9  --  3.9  CL 101  < > 104  < > 103 99* 101 101 101  --  100*  CO2 28  < > 26  < > 28 25 27 27 26   --  27  GLUCOSE 138*  < > 121*  < > 125* 125* 123* 121* 125*  --  125*  BUN 35*  < > 34*  < > 27* 24* 22* 20 20  --  18  CREATININE 1.71*  < > 1.71*  < > 1.54* 1.51* 1.43* 1.42* 1.48*  --  1.40*  CALCIUM 8.0*  < > 8.1*  < > 8.2* 8.4* 8.3* 8.5* 8.5*  --  8.6*  MG 2.3  --  2.2  --  2.2  --   --  2.3  --  2.3  --   PHOS  --   < > 2.4*  < > 2.4* 2.8  --  2.8 2.9  --  2.8  < > = values in this interval not displayed. Estimated Creatinine Clearance: 69.5 mL/min (A) (by C-G formula based on SCr of 1.4 mg/dL (H)).   LIVER  Recent Labs Lab 05/17/16 0352  05/19/16 0330  05/20/16 0405  05/21/16 0400 05/21/16 1901 05/22/16 0430 05/22/16 1600 05/23/16 0415 05/23/16 0416  AST 1,041*  --  367*  --  163*  --   --   --  69*  --  56*  --   ALT 1,106*  --  769*  --  515*  --   --   --  276*  --  225*  --   ALKPHOS 133*  --  160*  --  141*  --   --   --   129*  --  121  --   BILITOT 5.8*  --  5.2*  --  5.6*  --   --   --  6.2*  --  6.2*  --   PROT 6.6  --  6.7  --  6.7  --   --   --  7.0  --  7.1  --   ALBUMIN 2.8*  < > 2.6*  < > 2.5*  2.5*  < > 2.3* 2.7* 2.7*  2.7* 2.7* 2.8* 2.8*  INR 1.78  < > 1.40  --  1.41  --  1.36  --  1.28  --  1.37  --   < > = values in this interval not displayed.   INFECTIOUS  Recent Labs Lab 05/16/16 2329 05/19/16 0951  LATICACIDVEN 1.9 1.4     ENDOCRINE CBG (last 3)   Recent Labs  05/22/16 2319 05/23/16 0305 05/23/16 0734  GLUCAP 132* 127* 116*         IMAGING x48h  - image(s) personally visualized  -   highlighted in bold Dg Chest Port 1 View  Result Date: 05/23/2016 CLINICAL DATA:  Acute respiratory failure. EXAM: PORTABLE CHEST 1 VIEW COMPARISON:  Radiograph of May 22, 2016. FINDINGS: Endotracheal and nasogastric tubes are unchanged in position. Stable position of bilateral jugular catheters. No pneumothorax is noted. Mildly increased perihilar and basilar opacities are noted concerning for worsening edema or infiltrates. Bony thorax is unremarkable. IMPRESSION: Stable support apparatus. Mildly increased bilateral lung opacities as described above. Electronically Signed   By: Lupita RaiderJames  Green Jr, M.D.   On: 05/23/2016 07:41   Dg Chest Port 1 View  Result Date: 05/22/2016 CLINICAL DATA:  Intubation. EXAM: PORTABLE CHEST 1 VIEW COMPARISON:  05/21/2016. FINDINGS: Endotracheal tube, bilateral IJ lines, NG tube in stable position. Cardiomegaly with diffuse bilateral pulmonary infiltrates consistent pulmonary edema. Small bilateral pleural effusions. No pneumothorax . IMPRESSION: 1. Lines and tubes in stable position. 2. Cardiomegaly with diffuse bilateral pulmonary infiltrates and small pleural effusions consistent CHF . Electronically Signed   By: Maisie Fushomas  Register   On: 05/22/2016 06:49    DISCUSSION: 71 year old man, history of fibrillation, obesity, admitted with respiratory failure and septic  shock in the setting of H flu pneumonia. Subsequent course complicated by retroperitoneal bleeding on anticoagulation, hemorrhagic shock, acute respiratory failure and acute renal failure. Currently mechanically ventilated, on CVVHD. Pressor needs improving slowly. Will need trach in order to progress  ASSESSMENT / PLAN:  PULMONARY A: Acute respiratory failure Pneumonia, H influenza (beta lactamase positive) Presumed OSA/OHS History of asthma P:   Continue efforts at daily with assessment, pressure support ventilation as he can tolerate. Mental status precludes extubation at this time I have consulted ENT for evaluation for tracheostomy placement with the expectation of a prolonged ventilator weaning time Albuterol when necessary  CARDIOVASCULAR A:  Shock, principally hemorrhagic. Component of volume shifts given continuous dialysis  Paroxysmal atrial fibrillation History of hypertension P:  Currently on Levothroid, vasopressin. Attempting to wean. May need to slow down volume removal, has been tolerating -50 mL per hour fairly well Follow hemoglobin, currently stable Empiric hydrocortisone for relative adrenal insufficiency Defer anticoagulation for now as below  RENAL A:   Acute renal failure, presumed due to ATN in the setting of hemorrhagic shock P:   Appreciate nephrology assistance Continue CVVHD with planned volume removal 50-100 mL per hour. May need to adjust this based on his continued  pressor needs  GASTROINTESTINAL A:   Large retroperitoneal bleed noted, some slight increased 3/18 CT scan, hemoglobin currently stable Shock liver, transaminitis Hepatic lesion; noted on CT scan of the abdomen 3/18. 5 cm lesion of the dome of the liver, could represent blood or infection or infarct.  Nutrition P:   Follow CBC, LFT Transfusion goal hemoglobin greater 7.0 PPI as ordered  Do not believe the patient is stable to pursue sampling of the liver lesion at this time. If he  decompensates in any way we would need to consider empiric coverage for possible hepatic abscess, reimaging, even possible drainage. Continue current tube feeds, consider advancing to goal rate when residuals have improved  HEMATOLOGIC A:   Acute blood loss anemia, improved Severe heparin sensitivity, associated with hemorrhage P:  Avoid all heparin products We will need to decide on anticoagulation use given his atrial fibrillation, would defer for now given life-threatening bleeding Continue SCD  INFECTIOUS A:   H. influenzae pneumonia Septic shock P:   Abx completed 05/17/16 Follow fever curve, white blood cell count  ENDOCRINE A:   Hyperglycemia, on steroids Hypothyroidism P:   Sliding scale insulin coverage Continue Synthroid  NEUROLOGIC A:   Toxic metabolic encephalopathy History of CVA with residual deficits P:   RASS goal: -2 to -3 Continues dilaudid for comfort, midazolam when necessary; plan to attempt to wean daily for formal wake up assessment   FAMILY DISCUSSIONS:  Full code. Family very upset at outcome, bleeding - see 05/14/16 and 05/15/16 High mortality situation and defnitely high morbidity situation  On 05/17/16 - daughter at bedside: very upset that Iv Heparin gtt was given. Says that in 2013 for A Fib stroke admit got IV heparin gtt and bled around heart. Upset to heart hat this is not in record because Rx was [facility]. She says other anticoagulants including coumadin fine but not IV heparing ttt. Reports that she informed both at [facility] and [facility] to staff that patient should never get IV heparin gttt (she did this a priori per her). Upset that we did not take this into account and list in allergy list and still gave IV heparin gtt. REports that staff used words like "Trial and Error".   Updated by RN by phone 3/16-3/20  Planning for a formal family mtg with them on 3/21 to update in full.   - Inter-disciplinary family meet or Palliative Care  meeting due by:  Ongoing   CODE STATUS Full Code  DISPO Keep in ICU    Independent CC time 35 minutes  Levy Pupa, MD, PhD 05/23/2016, 10:13 AM [facility] [phone] or if no answer [phone]

## 2016-05-24 ENCOUNTER — Inpatient Hospital Stay (HOSPITAL_COMMUNITY): Payer: Medicare Other | Admitting: Certified Registered"

## 2016-05-24 ENCOUNTER — Encounter (HOSPITAL_COMMUNITY): Admission: EM | Disposition: E | Payer: Self-pay | Source: Home / Self Care | Attending: Internal Medicine

## 2016-05-24 HISTORY — PX: TRACHEOSTOMY TUBE PLACEMENT: SHX814

## 2016-05-24 LAB — CBC WITH DIFFERENTIAL/PLATELET
BASOS PCT: 0 %
Basophils Absolute: 0 10*3/uL (ref 0.0–0.1)
EOS PCT: 0 %
Eosinophils Absolute: 0 10*3/uL (ref 0.0–0.7)
HEMATOCRIT: 26.3 % — AB (ref 39.0–52.0)
HEMOGLOBIN: 8.2 g/dL — AB (ref 13.0–17.0)
Lymphocytes Relative: 6 %
Lymphs Abs: 0.6 10*3/uL — ABNORMAL LOW (ref 0.7–4.0)
MCH: 32.3 pg (ref 26.0–34.0)
MCHC: 31.2 g/dL (ref 30.0–36.0)
MCV: 103.5 fL — AB (ref 78.0–100.0)
MONOS PCT: 14 %
Monocytes Absolute: 1.4 10*3/uL — ABNORMAL HIGH (ref 0.1–1.0)
NEUTROS PCT: 80 %
Neutro Abs: 8.2 10*3/uL — ABNORMAL HIGH (ref 1.7–7.7)
Platelets: 65 10*3/uL — ABNORMAL LOW (ref 150–400)
RBC: 2.54 MIL/uL — AB (ref 4.22–5.81)
RDW: 24.4 % — ABNORMAL HIGH (ref 11.5–15.5)
WBC: 10.2 10*3/uL (ref 4.0–10.5)

## 2016-05-24 LAB — GLUCOSE, CAPILLARY
GLUCOSE-CAPILLARY: 115 mg/dL — AB (ref 65–99)
GLUCOSE-CAPILLARY: 125 mg/dL — AB (ref 65–99)
GLUCOSE-CAPILLARY: 111 mg/dL — AB (ref 65–99)
GLUCOSE-CAPILLARY: 115 mg/dL — AB (ref 65–99)
Glucose-Capillary: 118 mg/dL — ABNORMAL HIGH (ref 65–99)
Glucose-Capillary: 112 mg/dL — ABNORMAL HIGH (ref 65–99)

## 2016-05-24 LAB — RENAL FUNCTION PANEL
ALBUMIN: 2.8 g/dL — AB (ref 3.5–5.0)
ANION GAP: 7 (ref 5–15)
BUN: 18 mg/dL (ref 6–20)
CO2: 28 mmol/L (ref 22–32)
Calcium: 8.7 mg/dL — ABNORMAL LOW (ref 8.9–10.3)
Chloride: 101 mmol/L (ref 101–111)
Creatinine, Ser: 1.41 mg/dL — ABNORMAL HIGH (ref 0.61–1.24)
GFR calc Af Amer: 56 mL/min — ABNORMAL LOW (ref >60)
GFR, EST NON AFRICAN AMERICAN: 48 mL/min — AB (ref >60)
Glucose, Bld: 130 mg/dL — ABNORMAL HIGH (ref 65–99)
PHOSPHORUS: 2.3 mg/dL — AB (ref 2.5–4.6)
POTASSIUM: 4 mmol/L (ref 3.5–5.1)
Sodium: 136 mmol/L (ref 135–145)

## 2016-05-24 LAB — PROTIME-INR
INR: 1.35
Prothrombin Time: 16.8 seconds — ABNORMAL HIGH (ref 11.4–15.2)

## 2016-05-24 LAB — MAGNESIUM: Magnesium: 2.4 mg/dL (ref 1.7–2.4)

## 2016-05-24 SURGERY — CREATION, TRACHEOSTOMY
Anesthesia: General | Site: Neck

## 2016-05-24 MED ORDER — MIDAZOLAM HCL 2 MG/2ML IJ SOLN
INTRAMUSCULAR | Status: DC | PRN
  Administered 2016-05-24: 2 mg via INTRAVENOUS

## 2016-05-24 MED ORDER — LIDOCAINE-EPINEPHRINE 1 %-1:100000 IJ SOLN
INTRAMUSCULAR | Status: DC | PRN
  Administered 2016-05-24: 20 mL

## 2016-05-24 MED ORDER — ROCURONIUM 10MG/ML (10ML) SYRINGE FOR MEDFUSION PUMP - OPTIME
INTRAVENOUS | Status: DC | PRN
  Administered 2016-05-24: 50 mg via INTRAVENOUS

## 2016-05-24 MED ORDER — 0.9 % SODIUM CHLORIDE (POUR BTL) OPTIME
TOPICAL | Status: DC | PRN
  Administered 2016-05-24: 1000 mL

## 2016-05-24 MED ORDER — FUROSEMIDE 10 MG/ML IJ SOLN
160.0000 mg | Freq: Once | INTRAMUSCULAR | Status: AC
  Administered 2016-05-24: 160 mg via INTRAVENOUS
  Filled 2016-05-24: qty 16

## 2016-05-24 MED ORDER — MIDAZOLAM HCL 2 MG/2ML IJ SOLN
INTRAMUSCULAR | Status: AC
  Filled 2016-05-24: qty 2

## 2016-05-24 MED ORDER — SODIUM CHLORIDE 0.9 % IV SOLN
INTRAVENOUS | Status: DC | PRN
  Administered 2016-05-24: 13:00:00 via INTRAVENOUS

## 2016-05-24 MED ORDER — FENTANYL CITRATE (PF) 100 MCG/2ML IJ SOLN
INTRAMUSCULAR | Status: DC | PRN
Start: 2016-05-24 — End: 2016-05-24
  Administered 2016-05-24: 100 ug via INTRAVENOUS

## 2016-05-24 MED ORDER — FENTANYL CITRATE (PF) 100 MCG/2ML IJ SOLN
INTRAMUSCULAR | Status: AC
  Filled 2016-05-24: qty 2

## 2016-05-24 MED ORDER — PANTOPRAZOLE SODIUM 40 MG IV SOLR
40.0000 mg | INTRAVENOUS | Status: DC
Start: 2016-05-24 — End: 2016-05-25
  Administered 2016-05-24 – 2016-05-25 (×2): 40 mg via INTRAVENOUS
  Filled 2016-05-24 (×2): qty 40

## 2016-05-24 MED ORDER — LIDOCAINE-EPINEPHRINE 2 %-1:100000 IJ SOLN
INTRAMUSCULAR | Status: AC
  Filled 2016-05-24: qty 1

## 2016-05-24 SURGICAL SUPPLY — 47 items
BLADE SURG 11 STRL SS (BLADE) ×2 IMPLANT
BLADE SURG 15 STRL LF DISP TIS (BLADE) IMPLANT
BLADE SURG 15 STRL SS (BLADE) ×3
CANISTER SUCT 3000ML PPV (MISCELLANEOUS) ×3 IMPLANT
CLEANER TIP ELECTROSURG 2X2 (MISCELLANEOUS) ×3 IMPLANT
COVER SURGICAL LIGHT HANDLE (MISCELLANEOUS) ×3 IMPLANT
CRADLE DONUT ADULT HEAD (MISCELLANEOUS) IMPLANT
DECANTER SPIKE VIAL GLASS SM (MISCELLANEOUS) ×3 IMPLANT
DRAPE PROXIMA HALF (DRAPES) IMPLANT
ELECT COATED BLADE 2.86 ST (ELECTRODE) ×3 IMPLANT
ELECT REM PT RETURN 9FT ADLT (ELECTROSURGICAL) ×3
ELECTRODE REM PT RTRN 9FT ADLT (ELECTROSURGICAL) ×1 IMPLANT
GAUZE PETROLATUM 1 X8 (GAUZE/BANDAGES/DRESSINGS) ×8 IMPLANT
GAUZE SPONGE 4X4 16PLY XRAY LF (GAUZE/BANDAGES/DRESSINGS) ×3 IMPLANT
GEL ULTRASOUND 20GR AQUASONIC (MISCELLANEOUS) ×2 IMPLANT
GLOVE BIO SURGEON STRL SZ7.5 (GLOVE) ×5 IMPLANT
GLOVE BIOGEL PI IND STRL 6.5 (GLOVE) ×3 IMPLANT
GLOVE BIOGEL PI IND STRL 7.5 (GLOVE) IMPLANT
GLOVE BIOGEL PI INDICATOR 6.5 (GLOVE) ×6
GLOVE BIOGEL PI INDICATOR 7.5 (GLOVE) ×4
GOWN STRL REUS W/ TWL LRG LVL3 (GOWN DISPOSABLE) ×2 IMPLANT
GOWN STRL REUS W/TWL LRG LVL3 (GOWN DISPOSABLE) ×6
HOLDER TRACH TUBE VELCRO 19.5 (MISCELLANEOUS) ×5 IMPLANT
KIT BASIN OR (CUSTOM PROCEDURE TRAY) ×3 IMPLANT
KIT ROOM TURNOVER OR (KITS) ×3 IMPLANT
KIT SUCTION CATH 14FR (SUCTIONS) IMPLANT
NEEDLE HYPO 25GX1X1/2 BEV (NEEDLE) ×3 IMPLANT
NS IRRIG 1000ML POUR BTL (IV SOLUTION) ×3 IMPLANT
PACK EENT II TURBAN DRAPE (CUSTOM PROCEDURE TRAY) ×3 IMPLANT
PAD ARMBOARD 7.5X6 YLW CONV (MISCELLANEOUS) ×6 IMPLANT
PENCIL BUTTON HOLSTER BLD 10FT (ELECTRODE) ×3 IMPLANT
SPONGE DRAIN TRACH 4X4 STRL 2S (GAUZE/BANDAGES/DRESSINGS) ×3 IMPLANT
SPONGE INTESTINAL PEANUT (DISPOSABLE) IMPLANT
SUT MON AB 3-0 SH 27 (SUTURE) ×3
SUT MON AB 3-0 SH27 (SUTURE) ×1 IMPLANT
SUT SILK 0 FSL (SUTURE) ×3 IMPLANT
SUT SILK 2 0 REEL (SUTURE) ×3 IMPLANT
SUT SILK 2 0 SH CR/8 (SUTURE) ×3 IMPLANT
SUT VIC AB 2-0 FS1 27 (SUTURE) IMPLANT
SYR 20ML ECCENTRIC (SYRINGE) ×3 IMPLANT
SYR BULB 3OZ (MISCELLANEOUS) ×3 IMPLANT
SYR CONTROL 10ML LL (SYRINGE) ×2 IMPLANT
TOWEL OR 17X24 6PK STRL BLUE (TOWEL DISPOSABLE) ×3 IMPLANT
TUBE CONNECTING 12'X1/4 (SUCTIONS) ×1
TUBE CONNECTING 12X1/4 (SUCTIONS) ×2 IMPLANT
TUBE TRACH SHILEY 8 DIST CUF (TUBING) ×2 IMPLANT
WATER STERILE IRR 1000ML POUR (IV SOLUTION) ×3 IMPLANT

## 2016-05-24 NOTE — Op Note (Signed)
NAME:  Omar [name], Omar [name]                ACCOUNT NO.:  [id number]  MEDICAL RECORD NO.:  [id number]  LOCATION:  2M03C                        FACILITY:  [facility]  PHYSICIAN:  Antony Contraswight D Fabiha Rougeau, MD     DATE OF BIRTH:  Mar 17, 1945  DATE OF PROCEDURE:  05/14/2016 DATE OF DISCHARGE:                              OPERATIVE REPORT   PREOPERATIVE DIAGNOSIS:  Respiratory failure.  POSTOPERATIVE DIAGNOSIS:  Respiratory failure.  PROCEDURE:  Tracheostomy.  SURGEON:  Antony Contraswight D Jemya Depierro, MD.  ANESTHESIA:  General endotracheal anesthesia.  COMPLICATIONS:  None.  INDICATION:  The patient is a 71 year old male who was admitted to the hospital in early March with respiratory failure and has remained intubated through most of that hospitalization with inability to successfully wean from the ventilator.  Tracheostomy is requested.  FINDINGS:  The anatomy of the neck was normal.  DESCRIPTION OF PROCEDURE:  The patient was identified in the intensive care unit and informed consent having been obtained from his power of attorney, including discussion of risks, benefits, alternatives, the patient was brought to the operative suite and put on the operative table in supine position.  Anesthesia was maintained via endotracheal anesthesia.  A shoulder roll was placed and the patient was secured in place.  The anterior neck was prepped and draped in sterile fashion.  A midline vertical incision was marked with a marking pen and injected with 1% lidocaine with 1:100,000 epinephrine.  Incision through the skin was made with a 15 blade scalpel and subcutaneous fat was removed using Bovie electrocautery.  Dissection was then performed down through the midline raphae of the strap muscles to the cricoid.  The soft tissues were then divided off the cricoid and anterior trachea using Bovie electrocautery.  Some bleeding was encountered, but controlled with Bovie electrocautery.  Soft tissues were retracted either side and  a cricoid hook was placed under the cricoid and elevated.  The anterior tracheal wall was examined and an incision was made between rings 2 and 3 of the anterior tracheal wall using 15 blade scalpel.  Scissors were then used to extend the incision to either side and stay sutures of 2-0 silk suture were placed around the ring above and the ring below the trach site.  The endotracheal tube cuff was deflated and the tube was pulled back to above the trach site.  A #8 cuffed Shiley trach tube was then placed in the trach site and the cuff inflated.  The inner cannula was placed and the anesthesia circuit was hooked up to the trach tube and the patient is successfully ventilated with no drop in oxygen saturation.  The stay sutures were tied with 1 knot below and 2 knots above the trach site.  The flange of the trach tube was then secured to the anterior neck skin using 2-0 silk suture in 4 quadrants.  The patient was then cleaned off.  There was some bleeding in the trach from the trach stoma, so Vaseline gauze was then packed into the stoma consisting of 4 separate pieces with each piece having a tag extending out from the stoma.  The trach dressing and trach tie were then added.  The patient  was then returned to Anesthesia for transport and was moved to the intensive care unit in stable condition.     Antony Contras, MD     DDB/MEDQ  D:  05/11/2016  T:  05/20/2016  Job:  [id number]

## 2016-05-24 NOTE — Transfer of Care (Signed)
Immediate Anesthesia Transfer of Care Note  Patient: Omar FellingWilliam R Messler  Procedure(s) Performed: Procedure(s): TRACHEOSTOMY (N/A)  Patient Location: ICU  Anesthesia Type:General  Level of Consciousness: sedated, patient cooperative and Patient remains intubated per anesthesia plan  Airway & Oxygen Therapy: Patient placed on Ventilator (see vital sign flow sheet for setting)  Post-op Assessment: Report given to RN and Post -op Vital signs reviewed and stable  Post vital signs: Reviewed and stable  Last Vitals:  Vitals:   05/13/2016 1215 05/22/2016 1230  BP: 103/64 107/60  Pulse: (!) 102 (!) 116  Resp: 18 15  Temp:      Last Pain:  Vitals:   05/23/2016 1201  TempSrc: Axillary  PainSc:          Complications: No apparent anesthesia complications

## 2016-05-24 NOTE — Progress Notes (Signed)
Patient ID: Omar [name], male   DOB: [birth year], 71 y.o.   MRN: [id number]  [facility] Progress Note   Assessment/ Plan:   1. Acute kidney injury: Likely ATN from hemorrhagic shock. He remains anuric and without any evidence of renal recovery- on CRRT since 3/12.  Significantly volume overloaded on physical exam but also hypoalbuminemic likely third spacing. Have been successful with ultrafiltration at net -50  -100 mL per hour for [name] while- now with hypotension and FI02 of 40 as well as CVP of 10 but still with edema up to the abdominal wall.  Baseline kidney function relatively normal.  Getting trach today - will keep off CRRT for rest of day and night- see if UOP picks up and/or able to achieve liberation from pressors.   2. Hemorrhagic shock from acute retroperitoneal bleed: Remains on pressors with limited ability to wean off - stable  3. Acute blood loss anemia secondary to retroperitoneal hemorrhage: Hemoglobin/hematocrit stable.  Transfusion of one unit 3/19. Have added aranesp.  No systemic heparin with CRRT  4. Elytes- stable on all 4 K dialysate 5. VDRF- for trach today - will be off CRRT during that time 6. BP/VOL- hypotensive- CVP 10- 100%sat on 40 FIO2 but significant edema on exam- albumin 2.8.  Successful with UF on machine but will hold today and give lasix see what UOP does - can steroids be weaned  Subjective:   No significant change overnight- remains hypotensive on pressors-  no mechanical issues with machine- just [name] tiny bit of urine- 2 liters negative with CRRT - CVP around 10   Objective:   BP 123/79   Pulse (!) 106   Temp 97.9 F (36.6 C) (Oral)   Resp 20   Ht '5\' 6"'  (1.676 m)   Wt (!) 161.9 kg (357 lb)   SpO2 100%   BMI 57.62 kg/m   Intake/Output Summary (Last 24 hours) at 05/10/2016 0744 Last data filed at 05/17/2016 0700  Gross per 24 hour  Intake          1521.39 ml  Output             3777 ml  Net         -2255.61 ml   Weight change: 0 kg  (0 lb)  Physical Exam: HWK:[id number], sedated, morbidly obese man.  Right IJ vascath placed 3/12  CVS: Pulse regular rhythm, Normal rate, S1 and S2 normal Resp: Diminished breath sounds bilaterally-no distinct rales or rhonchi Abd: Soft, obese, nontender Ext: 2-3+ upper and lower extremity edema, scrotal edema noted  Imaging: Dg Chest Port 1 View  Result Date: 05/23/2016 CLINICAL DATA:  Acute respiratory failure. EXAM: PORTABLE CHEST 1 VIEW COMPARISON:  Radiograph of May 22, 2016. FINDINGS: Endotracheal and nasogastric tubes are unchanged in position. Stable position of bilateral jugular catheters. No pneumothorax is noted. Mildly increased perihilar and basilar opacities are noted concerning for worsening edema or infiltrates. Bony thorax is unremarkable. IMPRESSION: Stable support apparatus. Mildly increased bilateral lung opacities as described above. Electronically Signed   By: Marijo Conception, M.D.   On: 05/23/2016 07:41    Labs: BMET  Recent Labs Lab 05/21/16 0400 05/21/16 1901 05/22/16 0001 05/22/16 0430 05/22/16 1600 05/23/16 0416 05/23/16 1515 05/12/2016 0400  NA 135 133* 135 136 135 134* 135 136  K 4.3 4.1 4.3 4.0 3.9 3.9 3.9 4.0  CL 103 99* 101 101 101 100* 102 101  CO2 '28 25 27 27 26 27 26 ' 28  GLUCOSE 125* 125* 123* 121* 125* 125* 128* 130*  BUN 27* 24* 22* '20 20 18 16 18  ' CREATININE 1.54* 1.51* 1.43* 1.42* 1.48* 1.40* 1.36* 1.41*  CALCIUM 8.2* 8.4* 8.3* 8.5* 8.5* 8.6* 8.5* 8.7*  PHOS 2.4* 2.8  --  2.8 2.9 2.8 2.4* 2.3*   CBC  Recent Labs Lab 05/19/16 0330  05/21/16 1938 05/22/16 0430 05/23/16 0415 05/26/2016 0400  WBC 18.1*  < > 10.0 8.8 8.4 10.2  NEUTROABS 14.8*  --   --  6.9 6.7 8.2*  HGB 8.0*  < > 8.0* 8.1* 8.0* 8.2*  HCT 25.2*  < > 25.1* 25.1* 25.4* 26.3*  MCV 97.3  < > 98.8 99.2 100.0 103.5*  PLT 76*  < > 72* 67* 69* 65*  < > = values in this interval not displayed.  Medications:    . sodium chloride   Intravenous Once  . chlorhexidine  gluconate (MEDLINE KIT)  15 mL Mouth Rinse BID  . Chlorhexidine Gluconate Cloth  6 each Topical Daily  . darbepoetin (ARANESP) injection - NON-DIALYSIS  150 mcg Subcutaneous Q Mon-1800  . feeding supplement (PRO-STAT SUGAR FREE 64)  30 mL Per Tube QID  . hydrocortisone sod succinate (SOLU-CORTEF) inj  50 mg Intravenous Q6H  . insulin aspart  0-9 Units Subcutaneous Q4H  . levothyroxine  125 mcg Per Tube QAC breakfast  . mouth rinse  15 mL Mouth Rinse QID  . pantoprazole sodium  40 mg Per Tube Daily  . polyethylene glycol  17 g Oral Daily   Omar [name]  05/05/2016, 7:44 AM

## 2016-05-24 NOTE — Anesthesia Procedure Notes (Signed)
Date/Time: 05/17/2016 1:04 PM Performed by: Rosiland OzMEYERS, Haig Gerardo Pre-anesthesia Checklist: Patient identified, Emergency Drugs available, Suction available, Patient being monitored and Timeout performed Patient Re-evaluated:Patient Re-evaluated prior to inductionOxygen Delivery Method: Circle system utilized Preoxygenation: Pre-oxygenation with 100% oxygen Intubation Type: Inhalational induction Comments: Indwelling 8.0 subglottic tube maintaned

## 2016-05-24 NOTE — Progress Notes (Signed)
PCCM Progress Note  Admission date: 05/23/2016 Referring provider: Dr. Clarene Duke, [facility]  CC: altered mental status  HPI: 71 y/o male from [facility] admitted 3/5 with altered mental status with aspiration, sepsis, hypoxic/hypercapnic respiratory failure. Course c/b retroperitoneal bleed, shock, acute renal failure   has a past medical history of Asthma; Atrial fibrillation (HCC); Gout; Hypertension; Morbid obesity (HCC); Multinodular goiter; Osteoarthrosis and allied disorders; Other and unspecified hyperlipidemia; Stroke [place]); Thrombocytopenia (HCC); and Vitamin D deficiency.   Antibiotics: Zosyn 3/06 >> 3/08 Rocephin (H.Flu PNA) 3/08 >> 3/14  Cultures: Urine 3/05 >> negative Blood 3/06 >> negative Resp 3/6 >> H flu (b-lactamase +)   Lines/tubes: ETT 3/05 >> 3/8 ETT 3/10 >>  Lt IJ CVL 3/07 >>   Events: 3/05  Transfer to [facility] Sputum 3/06 >> Haemophilus influenzae (beta lactamase positive) Echo 3/06 >> EF 65 to 70%, mod LVH, mod TR, PAS 39 mmHg CT head 3/7 >> no acute abnormality   3/08  extubated off pressors. No longer meeting sirs criteria  3/10  Reintubated, concern for bleed with drop in Hgb on heparin gtt, resp distress, AF w RVR.   CT ABD/Pelvis 3/10 >> large right retroperitoneal hematoma, small bilateral pleural effusions with extensive consolidation/atelectasis 3/11  Received 4 FFP overnight, 2 units PRBC  3/11  Remains on high dose vasopressors, CT c/w retroperitoneal bleed  RN reports hypotension on levo + neo.  FFP infusing.   3/12 - max pressors. Shock liver. Deeply sedated. S/p 4 units prbc and 4 unit ffp as of last night -> got 1 unit prbc, platlelet   And FFP x 5 -> HD cath placed . Famiily very angry at outcome of RP hemorrhage. Reports he is ssensitive to heparin based on 2013 experience. CCS -> no ro9le for surgery. Heme consult ->  Done. CRRT started  3/13 - events from yesterday noted. Down on vasopressor need. Down to levophed 18, neo 50, and vasopresson 0.03.  Ongoing CRRT. On 200 fentantyl mcg and RN reports responsiveness on left. Shock liver better. Renal rec dc bic gtt. S/p PRBC, FFP and platlets  3/14 - off neo, still on levophed 14 and shock dose vasop. Improved ur op but still in oliguric bleed. On Lawyer  . Shock liver better. Daughte at bedside: still very upset that patient was given IV heparin gtt  3/15 - still on levophed 18 and shock vaso. On even CRRT. 50% fio2. Bair hugger +. Oliguria +. Ur Op dropping. Tolerating TF. SEdated with fent gtt. Moves left per RN  3/16 - inreased levoiphed need to , on vaso. Seems to corelated with hydrocort dc. CBC holding.  Anuric - makigg 30cc urine per shift. Renal planning to incrase volume removeal. Singificant 3rd spacing +++/. Tolerating TF. Lorre Nick - moves left side and bites tube. Off bair hugger.   3/17 - improved levophed afte steroids started. Agitated on left during WUA. Anuric; on CRRT.    SUBJECTIVE/OVERNIGHT/INTERVAL HX Dilaudid to 4 norepi at 6 + vasopressin Tolerating volume removal, now only 1.6L positive for hospitalization.   Vital signs: BP 114/81   Pulse (!) 121   Temp 97.9 F (36.6 C) (Oral)   Resp 17   Ht 5\' 6"  (1.676 m)   Wt (!) 161.9 kg (357 lb)   SpO2 100%   BMI 57.62 kg/m   Intake/output:  Intake/Output Summary (Last 24 hours) at 22-Jun-2016 1104 Last data filed at 06-22-16 0906  Gross per 24 hour  Intake  1270.29 ml  Output             3383 ml  Net         -2112.71 ml   I/O last 3 completed shifts: In: 2047.5 [I.V.:1147.5; Other:150; NG/GT:750] Out: 5361 [Urine:37; Other:5324]  EXAM Gen: obese man, on MV  ENT: ETT in place, facial and orbital edema  Neck: large, JVD unable to assess,   Lungs: distant, coarse B   Cardiovascular: tachy, regular, no M  Abdomen: soft, obese, + BS  Musculoskeletal: no deformities  Neuro: moving spontaneously by non-purposeful. Opens eyes, does not track  Skin: no rash    LABS  PULMONARY No  results for input(s): PHART, PCO2ART, PO2ART, HCO3, TCO2, O2SAT in the last 168 hours.  Invalid input(s): PCO2, PO2  CBC  Recent Labs Lab 05/22/16 0430 05/23/16 0415 05/15/2016 0400  HGB 8.1* 8.0* 8.2*  HCT 25.1* 25.4* 26.3*  WBC 8.8 8.4 10.2  PLT 67* 69* 65*    COAGULATION  Recent Labs Lab 05/20/16 0405 05/21/16 0400 05/22/16 0430 05/23/16 0415 05/07/2016 0400  INR 1.41 1.36 1.28 1.37 1.35    CARDIAC   No results for input(s): TROPONINI in the last 168 hours. No results for input(s): PROBNP in the last 168 hours.   CHEMISTRY  Recent Labs Lab 05/20/16 0405  05/21/16 0400  05/22/16 0430 05/22/16 1600 05/23/16 0415 05/23/16 0416 05/23/16 1515 05/19/2016 0400  NA 135  < > 135  < > 136 135  --  134* 135 136  K 4.0  < > 4.3  < > 4.0 3.9  --  3.9 3.9 4.0  CL 104  < > 103  < > 101 101  --  100* 102 101  CO2 26  < > 28  < > 27 26  --  27 26 28   GLUCOSE 121*  < > 125*  < > 121* 125*  --  125* 128* 130*  BUN 34*  < > 27*  < > 20 20  --  18 16 18   CREATININE 1.71*  < > 1.54*  < > 1.42* 1.48*  --  1.40* 1.36* 1.41*  CALCIUM 8.1*  < > 8.2*  < > 8.5* 8.5*  --  8.6* 8.5* 8.7*  MG 2.2  --  2.2  --  2.3  --  2.3  --   --  2.4  PHOS 2.4*  < > 2.4*  < > 2.8 2.9  --  2.8 2.4* 2.3*  < > = values in this interval not displayed. Estimated Creatinine Clearance: 69 mL/min (A) (by C-G formula based on SCr of 1.41 mg/dL (H)).   LIVER  Recent Labs Lab 05/19/16 0330  05/20/16 0405  05/21/16 0400  05/22/16 0430 05/22/16 1600 05/23/16 0415 05/23/16 0416 05/23/16 1515 05/31/2016 0400  AST 367*  --  163*  --   --   --  69*  --  56*  --   --   --   ALT 769*  --  515*  --   --   --  276*  --  225*  --   --   --   ALKPHOS 160*  --  141*  --   --   --  129*  --  121  --   --   --   BILITOT 5.2*  --  5.6*  --   --   --  6.2*  --  6.2*  --   --   --  PROT 6.7  --  6.7  --   --   --  7.0  --  7.1  --   --   --   ALBUMIN 2.6*  < > 2.5*  2.5*  < > 2.3*  < > 2.7*  2.7* 2.7* 2.8* 2.8*  2.8* 2.8*  INR 1.40  --  1.41  --  1.36  --  1.28  --  1.37  --   --  1.35  < > = values in this interval not displayed.   INFECTIOUS  Recent Labs Lab 05/19/16 0951  LATICACIDVEN 1.4     ENDOCRINE CBG (last 3)   Recent Labs  05/23/16 2341 Mar 16, 2016 0415 Mar 16, 2016 0711  GLUCAP 142* 115* 125*     IMAGING x48h  - image(s) personally visualized  -   highlighted in bold Dg Chest Port 1 View  Result Date: 05/23/2016 CLINICAL DATA:  Acute respiratory failure. EXAM: PORTABLE CHEST 1 VIEW COMPARISON:  Radiograph of May 22, 2016. FINDINGS: Endotracheal and nasogastric tubes are unchanged in position. Stable position of bilateral jugular catheters. No pneumothorax is noted. Mildly increased perihilar and basilar opacities are noted concerning for worsening edema or infiltrates. Bony thorax is unremarkable. IMPRESSION: Stable support apparatus. Mildly increased bilateral lung opacities as described above. Electronically Signed   By: Lupita RaiderJames  Green Jr, M.D.   On: 05/23/2016 07:41    DISCUSSION: 71 year old man, history of fibrillation, obesity, admitted with respiratory failure and septic shock in the setting of H flu pneumonia. Subsequent course complicated by retroperitoneal bleeding on anticoagulation, hemorrhagic shock, acute respiratory failure and acute renal failure. Currently mechanically ventilated, on CVVHD. Pressor needs improving slowly. ENT arranging for trach  ASSESSMENT / PLAN:  PULMONARY A: Acute respiratory failure Pneumonia, H influenza (beta lactamase positive) Presumed OSA/OHS History of asthma P:   Goal push weaning efforts given his volume removal, planned trach Albuterol when necessary  CARDIOVASCULAR A:  Shock, principally hemorrhagic. Component of volume shifts given continuous dialysis  Paroxysmal atrial fibrillation History of hypertension P:  Continue to wean norepi and vasopressin Tolerating volume removal, goal get to even Follow Hgb, has been  stable last several days continue hydrocort  for relative adrenal insufficiency Will need to address anticoagulation once we are comfortable that bleeding stable, after trach placement.   RENAL A:   Acute renal failure, presumed due to ATN in the setting of hemorrhagic shock P:   Appreciate nephrology assistance Planning for break from CVVHD today, assess UOP and renal fxn while off.   GASTROINTESTINAL A:   Large retroperitoneal bleed noted, some slight increased 3/18 CT scan, hemoglobin currently stable Shock liver, transaminitis Hepatic lesion; noted on CT scan of the abdomen 3/18. 5 cm lesion of the dome of the liver, could represent blood or infection or infarct.  Nutrition P:   Follow CBC, LFT Transfusion goal Hgb > 7 continue PPI No intervention for liver lesion at this time, following clinically.  If he decompensates in any way we would need to consider empiric coverage for possible hepatic abscess, reimaging, even possible drainage. TF on hold for trach 3/21  HEMATOLOGIC A:   Acute blood loss anemia, improved Severe heparin sensitivity, associated with hemorrhage P:  No more heparin products  Assess for possible restart anticoagulation over next several days.  SCD in place  INFECTIOUS A:   H. influenzae pneumonia Septic shock P:   Abx completed  Follow temp, WBC  ENDOCRINE A:   Hyperglycemia, on steroids Hypothyroidism P:   SSI  coverage  continue synthroid   NEUROLOGIC A:   Toxic metabolic encephalopathy History of CVA with residual deficits P:   RASS goal: will adjust to -1 after trach placed Dilaudid, prn versed.    FAMILY DISCUSSIONS:  Full code. Family very upset at outcome, bleeding - see 05/14/16 and 05/15/16 High mortality situation and defnitely high morbidity situation  On 05/17/16 - daughter at bedside: very upset that Iv Heparin gtt was given. Says that in 2013 for A Fib stroke admit got IV heparin gtt and bled around heart. Upset to  heart hat this is not in record because [facility]. She says other anticoagulants including coumadin fine but not IV heparing ttt. Reports that she informed both at [facility] and [facility] to staff that patient should never get IV heparin gttt (she did this a priori per her). Upset that we did not take this into account and list in allergy list and still gave IV heparin gtt. REports that staff used words like "Trial and Error".   Updated by RN by phone 3/16-3/20  Planning for a formal family mtg with them on 3/21 to update in full.   - Inter-disciplinary family meet or Palliative Care meeting due by:  Ongoing   CODE STATUS Full Code  DISPO Keep in ICU    Independent CC time 33 minutes  Levy Pupa, MD, PhD 05/06/2016, 11:04 AM [facility] [phone] or if no answer [phone]

## 2016-05-24 NOTE — Plan of Care (Signed)
Problem: Fluid Volume: Goal: Ability to maintain a balanced intake and output will improve Patient on CRRT

## 2016-05-24 NOTE — OR Nursing (Signed)
Obturator bagged and tagged with patient sticker, taped to head of patient bed at end of case.

## 2016-05-24 NOTE — Brief Op Note (Signed)
05/24/2016 - 05/26/2016  1:50 PM  PATIENT:  Omar FellingWilliam R [name]  71 y.o. male  PRE-OPERATIVE DIAGNOSIS:  RESPRITORY FAILURE  POST-OPERATIVE DIAGNOSIS:  RESPRITORY FAILURE  PROCEDURE:  Procedure(s): TRACHEOSTOMY (N/A)  SURGEON:  Surgeon(s) and Role:    * Christia Readingwight Marshall Roehrich, MD - Primary  PHYSICIAN ASSISTANT:   ASSISTANTS: none   ANESTHESIA:   general  EBL:  Total I/O In: 272.4 [I.V.:176.4; NG/GT:30; IV Piggyback:66] Out: 729 [Other:729]  BLOOD ADMINISTERED:none  DRAINS: none   LOCAL MEDICATIONS USED:  LIDOCAINE   SPECIMEN:  No Specimen  DISPOSITION OF SPECIMEN:  N/A  COUNTS:  YES  TOURNIQUET:  * No tourniquets in log *  DICTATION: .Other Dictation: Dictation Number [id number]  PLAN OF CARE: Return to ICU  PATIENT DISPOSITION:  ICU - intubated and hemodynamically stable.   Delay start of Pharmacological VTE agent (>24hrs) due to surgical blood loss or risk of bleeding: no

## 2016-05-24 NOTE — Progress Notes (Signed)
Patient's Daughter at bedside upset that patient was going down for a trach procedure. Patient's daughter also was verbally aggressive to a visitor that came to visit her father. Patient's daughter was using harsh profanity toward this visitor as well as staff. She was able to be talked down in regards to her behavior. She was told that she would be asked to leave. Patient's granddaughter (POA) is aware of her Mom's behavior and said "this is not going to be tolerated". Will continue to monitor situation and of course patient.

## 2016-05-24 NOTE — Anesthesia Preprocedure Evaluation (Addendum)
Anesthesia Evaluation  Patient identified by MRN, date of birth, ID band Patient unresponsive    Reviewed: Allergy & Precautions, NPO status , Patient's Chart, lab work & pertinent test results  Airway Mallampati: Intubated       Dental  (+) Poor Dentition   Pulmonary former smoker,       + intubated    Cardiovascular hypertension, Pt. on medications and Pt. on home beta blockers  Rhythm:Regular Rate:Tachycardia     Neuro/Psych negative psych ROS   GI/Hepatic   Endo/Other  Morbid obesity  Renal/GU      Musculoskeletal   Abdominal (+) + obese,   Peds  Hematology   Anesthesia Other Findings   Reproductive/Obstetrics                            Anesthesia Physical Anesthesia Plan  ASA: III  Anesthesia Plan: General   Post-op Pain Management:    Induction: Inhalational  Airway Management Planned: Tracheostomy  Additional Equipment:   Intra-op Plan:   Post-operative Plan: Post-operative intubation/ventilation  Informed Consent: I have reviewed the patients History and Physical, chart, labs and discussed the procedure including the risks, benefits and alternatives for the proposed anesthesia with the patient or authorized representative who has indicated his/her understanding and acceptance.   History available from chart only  Plan Discussed with: CRNA and Surgeon  Anesthesia Plan Comments:         Anesthesia Quick Evaluation

## 2016-05-24 NOTE — Care Management Note (Signed)
Case Management Note  Patient Details  Name: Omar [name] MRN: [id number] Date of Birth: 24-Jun-1945  Subjective/Objective: Pt admitted for acute resp failure - on ventilator                   Action/Plan:  PTA from home with daughter and grandkids.  Pt is wheelchair bound at home.  CM will continue to follow for discharge needs   Expected Discharge Date:                  Expected Discharge Plan:  Home/Self Care  In-House Referral:     Discharge planning Services  CM Consult  Post Acute Care Choice:    Choice offered to:     DME Arranged:    DME Agency:     HH Arranged:    HH Agency:     Status of Service:  In process, will continue to follow  If discussed at Long Length of Stay Meetings, dates discussed:    Additional Comments: 06/02/2016  Pt going for trach today.  Pt will have 24 hour CRRT holiday starting today and will resume tomorrow  05/23/16 Discussed in LOS 05/23/16 - remains appropriate for continued stay   05/22/16  Pt remains ventilated - not weaning well, remains on CRRT.  Plan is for possible trach this week  05/18/16 Discussed in LOS 05/18/16 - remains appropriate for continued stay.  Remains on ventilator Omar [name], Omar Pruiett S, RN 05/06/2016, 2:22 PM

## 2016-05-24 NOTE — Progress Notes (Signed)
RT note- Patient taken to OR by anesthesia, Ambu bag set up with peep valve.

## 2016-05-25 ENCOUNTER — Encounter (HOSPITAL_COMMUNITY): Payer: Self-pay | Admitting: Otolaryngology

## 2016-05-25 ENCOUNTER — Inpatient Hospital Stay (HOSPITAL_COMMUNITY): Payer: Medicare Other

## 2016-05-25 LAB — RENAL FUNCTION PANEL
Albumin: 2.4 g/dL — ABNORMAL LOW (ref 3.5–5.0)
Anion gap: 6 (ref 5–15)
BUN: 30 mg/dL — ABNORMAL HIGH (ref 6–20)
CHLORIDE: 103 mmol/L (ref 101–111)
CO2: 27 mmol/L (ref 22–32)
CREATININE: 2.27 mg/dL — AB (ref 0.61–1.24)
Calcium: 8.7 mg/dL — ABNORMAL LOW (ref 8.9–10.3)
GFR calc non Af Amer: 27 mL/min — ABNORMAL LOW (ref >60)
GFR, EST AFRICAN AMERICAN: 31 mL/min — AB (ref >60)
Glucose, Bld: 121 mg/dL — ABNORMAL HIGH (ref 65–99)
Phosphorus: 2.7 mg/dL (ref 2.5–4.6)
Potassium: 3.9 mmol/L (ref 3.5–5.1)
Sodium: 136 mmol/L (ref 135–145)

## 2016-05-25 LAB — GLUCOSE, CAPILLARY
Glucose-Capillary: 117 mg/dL — ABNORMAL HIGH (ref 65–99)
Glucose-Capillary: 118 mg/dL — ABNORMAL HIGH (ref 65–99)
Glucose-Capillary: 126 mg/dL — ABNORMAL HIGH (ref 65–99)
Glucose-Capillary: 129 mg/dL — ABNORMAL HIGH (ref 65–99)
Glucose-Capillary: 130 mg/dL — ABNORMAL HIGH (ref 65–99)
Glucose-Capillary: 144 mg/dL — ABNORMAL HIGH (ref 65–99)

## 2016-05-25 LAB — CBC WITH DIFFERENTIAL/PLATELET
BASOS PCT: 0 %
Basophils Absolute: 0 10*3/uL (ref 0.0–0.1)
EOS ABS: 0 10*3/uL (ref 0.0–0.7)
Eosinophils Relative: 0 %
HCT: 23.2 % — ABNORMAL LOW (ref 39.0–52.0)
Hemoglobin: 7.3 g/dL — ABNORMAL LOW (ref 13.0–17.0)
LYMPHS ABS: 0.6 10*3/uL — AB (ref 0.7–4.0)
Lymphocytes Relative: 5 %
MCH: 32.4 pg (ref 26.0–34.0)
MCHC: 31.5 g/dL (ref 30.0–36.0)
MCV: 103.1 fL — ABNORMAL HIGH (ref 78.0–100.0)
MONO ABS: 0.9 10*3/uL (ref 0.1–1.0)
Monocytes Relative: 8 %
NEUTROS ABS: 10.3 10*3/uL — AB (ref 1.7–7.7)
NEUTROS PCT: 87 %
PLATELETS: 67 10*3/uL — AB (ref 150–400)
RBC: 2.25 MIL/uL — ABNORMAL LOW (ref 4.22–5.81)
WBC: 11.8 10*3/uL — ABNORMAL HIGH (ref 4.0–10.5)

## 2016-05-25 LAB — PROTIME-INR
INR: 1.45
PROTHROMBIN TIME: 17.8 s — AB (ref 11.4–15.2)

## 2016-05-25 LAB — MAGNESIUM: Magnesium: 2.4 mg/dL (ref 1.7–2.4)

## 2016-05-25 MED ORDER — DEXMEDETOMIDINE HCL IN NACL 200 MCG/50ML IV SOLN
0.4000 ug/kg/h | INTRAVENOUS | Status: DC
  Administered 2016-05-25 (×2): 0.5 ug/kg/h via INTRAVENOUS
  Administered 2016-05-25: 0.3 ug/kg/h via INTRAVENOUS
  Administered 2016-05-25: 0.4 ug/kg/h via INTRAVENOUS
  Administered 2016-05-26: 0.3 ug/kg/h via INTRAVENOUS
  Administered 2016-05-26 (×3): 0.4 ug/kg/h via INTRAVENOUS
  Administered 2016-05-26 (×2): 0.3 ug/kg/h via INTRAVENOUS
  Administered 2016-05-26 – 2016-05-27 (×2): 0.4 ug/kg/h via INTRAVENOUS
  Filled 2016-05-25 (×7): qty 50
  Filled 2016-05-25 (×3): qty 100

## 2016-05-25 MED ORDER — FUROSEMIDE 10 MG/ML IJ SOLN
160.0000 mg | Freq: Two times a day (BID) | INTRAVENOUS | Status: AC
  Administered 2016-05-25 (×2): 160 mg via INTRAVENOUS
  Filled 2016-05-25 (×2): qty 16

## 2016-05-25 MED ORDER — PANTOPRAZOLE SODIUM 40 MG PO PACK
40.0000 mg | PACK | Freq: Every day | ORAL | Status: DC
  Administered 2016-05-26 – 2016-06-20 (×26): 40 mg
  Filled 2016-05-25 (×25): qty 20

## 2016-05-25 NOTE — Progress Notes (Addendum)
PCCM Progress Note  Admission date: 05/12/2016 Referring provider: Dr. Clarene Duke, [facility]  CC: altered mental status  HPI: 71 y/o male from [facility] admitted 3/5 with altered mental status with aspiration, sepsis, hypoxic/hypercapnic respiratory failure. Course c/b retroperitoneal bleed, shock, acute renal failure   has a past medical history of Asthma; Atrial fibrillation (HCC); Gout; Hypertension; Morbid obesity (HCC); Multinodular goiter; Osteoarthrosis and allied disorders; Other and unspecified hyperlipidemia; Stroke [facility]); Thrombocytopenia (HCC); and Vitamin D deficiency.   Antibiotics: Zosyn 3/06 >> 3/08 Rocephin (H.Flu PNA) 3/08 >> 3/14  Cultures: Urine 3/05 >> negative Blood 3/06 >> negative Resp 3/6 >> H flu (b-lactamase +)   Lines/tubes: ETT 3/05 >> 3/8 ETT 3/10 >> 3/21 Trach (Dr Jenne Pane) 3/21 >>  Lt IJ CVL 3/07 >>   Events: 3/05  Transfer to [facility] Sputum 3/06 >> Haemophilus influenzae (beta lactamase positive) Echo 3/06 >> EF 65 to 70%, mod LVH, mod TR, PAS 39 mmHg CT head 3/7 >> no acute abnormality   3/08  extubated off pressors. No longer meeting sirs criteria  3/10  Reintubated, concern for bleed with drop in Hgb on heparin gtt, resp distress, AF w RVR.   CT ABD/Pelvis 3/10 >> large right retroperitoneal hematoma, small bilateral pleural effusions with extensive consolidation/atelectasis 3/11  Received 4 FFP overnight, 2 units PRBC  3/11  Remains on high dose vasopressors, CT c/w retroperitoneal bleed  RN reports hypotension on levo + neo.  FFP infusing.   3/12 - max pressors. Shock liver. Deeply sedated. S/p 4 units prbc and 4 unit ffp as of last night -> got 1 unit prbc, platlelet   And FFP x 5 -> HD cath placed . Famiily very angry at outcome of RP hemorrhage. Reports he is ssensitive to heparin based on 2013 experience. CCS -> no ro9le for surgery. Heme consult ->  Done. CRRT started  3/13 - events from yesterday noted. Down on vasopressor need. Down to levophed 18,  neo 50, and vasopresson 0.03. Ongoing CRRT. On 200 fentantyl mcg and RN reports responsiveness on left. Shock liver better. Renal rec dc bic gtt. S/p PRBC, FFP and platlets  3/14 - off neo, still on levophed 14 and shock dose vasop. Improved ur op but still in oliguric bleed. On Lawyer  . Shock liver better. Daughte at bedside: still very upset that patient was given IV heparin gtt  3/15 - still on levophed 18 and shock vaso. On even CRRT. 50% fio2. Bair hugger +. Oliguria +. Ur Op dropping. Tolerating TF. SEdated with fent gtt. Moves left per RN  3/16 - inreased levoiphed need to , on vaso. Seems to corelated with hydrocort dc. CBC holding.  Anuric - makigg 30cc urine per shift. Renal planning to incrase volume removeal. Singificant 3rd spacing +++/. Tolerating TF. Lorre Nick - moves left side and bites tube. Off bair hugger.   3/17 - improved levophed afte steroids started. Agitated on left during WUA. Anuric; on CRRT.    SUBJECTIVE/OVERNIGHT/INTERVAL HX Dilaudid at 3.5 Remains on norepi and vasopressin CVVHD was stopped 3/21, some UOP recorded > 120 last 24h  Vital signs: BP 114/71   Pulse 72   Temp 98.3 F (36.8 C) (Oral)   Resp 20   Ht 5\' 6"  (1.676 m)   Wt (!) 161.9 kg (356 lb 14.8 oz)   SpO2 97%   BMI 57.61 kg/m   Intake/output:  Intake/Output Summary (Last 24 hours) at 05/25/16 0916 Last data filed at 05/25/16 0800  Gross per 24 hour  Intake           660.04 ml  Output              651 ml  Net             9.04 ml   I/O last 3 completed shifts: In: 1427.9 [I.V.:1171.9; NG/GT:190; IV Piggyback:66] Out: 2568 [Urine:157; ZOXWR:6045Other:2401; Blood:10]  EXAM Gen: obese man, on MV, some mild discomfort / agitation   ENT: ETT in place, no lesions  Neck: large, ?? JVP  Lungs: decreased at bases, B mild insp crackles.   Cardiovascular:regular, tachy, distant, No M, 1+ edema  Abdomen: soft, obese, + BS  Musculoskeletal: no deformities  Neuro: moves spont, nothing  purposeful.   Skin: no rash   LABS  PULMONARY No results for input(s): PHART, PCO2ART, PO2ART, HCO3, TCO2, O2SAT in the last 168 hours.  Invalid input(s): PCO2, PO2  CBC  Recent Labs Lab 05/23/16 0415 05/18/2016 0400 05/25/16 0430  HGB 8.0* 8.2* 7.3*  HCT 25.4* 26.3* 23.2*  WBC 8.4 10.2 11.8*  PLT 69* 65* 67*    COAGULATION  Recent Labs Lab 05/21/16 0400 05/22/16 0430 05/23/16 0415 05/20/2016 0400 05/25/16 0430  INR 1.36 1.28 1.37 1.35 1.45    CARDIAC   No results for input(s): TROPONINI in the last 168 hours. No results for input(s): PROBNP in the last 168 hours.   CHEMISTRY  Recent Labs Lab 05/21/16 0400  05/22/16 0430 05/22/16 1600 05/23/16 0415 05/23/16 0416 05/23/16 1515 06/01/2016 0400 05/25/16 0430  NA 135  < > 136 135  --  134* 135 136 136  K 4.3  < > 4.0 3.9  --  3.9 3.9 4.0 3.9  CL 103  < > 101 101  --  100* 102 101 103  CO2 28  < > 27 26  --  27 26 28 27   GLUCOSE 125*  < > 121* 125*  --  125* 128* 130* 121*  BUN 27*  < > 20 20  --  18 16 18  30*  CREATININE 1.54*  < > 1.42* 1.48*  --  1.40* 1.36* 1.41* 2.27*  CALCIUM 8.2*  < > 8.5* 8.5*  --  8.6* 8.5* 8.7* 8.7*  MG 2.2  --  2.3  --  2.3  --   --  2.4 2.4  PHOS 2.4*  < > 2.8 2.9  --  2.8 2.4* 2.3* 2.7  < > = values in this interval not displayed. Estimated Creatinine Clearance: 42.9 mL/min (A) (by C-G formula based on SCr of 2.27 mg/dL (H)).   LIVER  Recent Labs Lab 05/19/16 0330  05/20/16 0405  05/21/16 0400  05/22/16 0430  05/23/16 0415 05/23/16 0416 05/23/16 1515 06/02/2016 0400 05/25/16 0430  AST 367*  --  163*  --   --   --  69*  --  56*  --   --   --   --   ALT 769*  --  515*  --   --   --  276*  --  225*  --   --   --   --   ALKPHOS 160*  --  141*  --   --   --  129*  --  121  --   --   --   --   BILITOT 5.2*  --  5.6*  --   --   --  6.2*  --  6.2*  --   --   --   --  PROT 6.7  --  6.7  --   --   --  7.0  --  7.1  --   --   --   --   ALBUMIN 2.6*  < > 2.5*  2.5*  < >  2.3*  < > 2.7*  2.7*  < > 2.8* 2.8* 2.8* 2.8* 2.4*  INR 1.40  --  1.41  --  1.36  --  1.28  --  1.37  --   --  1.35 1.45  < > = values in this interval not displayed.   INFECTIOUS  Recent Labs Lab 05/19/16 0951  LATICACIDVEN 1.4     ENDOCRINE CBG (last 3)   Recent Labs  06/03/2016 2312 05/25/16 0352 05/25/16 0722  GLUCAP 115* 129* 118*     IMAGING x48h  - image(s) personally visualized  -   highlighted in bold Dg Chest Port 1 View  Result Date: 05/25/2016 CLINICAL DATA:  Respiratory failure. EXAM: PORTABLE CHEST 1 VIEW COMPARISON:  05/23/2016. FINDINGS: Surgical clips right neck. Interim removal of endotracheal tube. Tracheostomy tube noted in good anatomic position. Interim removal of NG tube . Bilateral IJ lines in stable position. Cardiomegaly with diffuse bilateral pulmonary infiltrates consistent pulmonary edema again noted. No interim change. Small bilateral pleural effusions. No pneumothorax . IMPRESSION: 1. Interim removal of endotracheal tube. Tracheostomy tube noted in good anatomic position. Interim removal of NG tube. Bilateral IJ lines in stable position. 2. Cardiomegaly with diffuse bilateral pulmonary infiltrates consistent pulmonary edema. Bilateral pneumonia cannot be excluded. Small bilateral pleural effusions. Similar findings noted on prior exam . Electronically Signed   By: Maisie Fus  Register   On: 05/25/2016 07:06    DISCUSSION: 71 year old man, history of fibrillation, obesity, admitted with respiratory failure and septic shock in the setting of H flu pneumonia. Subsequent course complicated by retroperitoneal bleeding on anticoagulation, hemorrhagic shock, acute respiratory failure and acute renal failure. Currently mechanically ventilated, on CVVHD. Pressor needs improving slowly. s/p trach   ASSESSMENT / PLAN:  PULMONARY A: Acute respiratory failure Pneumonia, H influenza (beta lactamase positive) Presumed OSA/OHS History of asthma Bilateral pulm  infiltrates / effusions, consistent w volume overload P:   Will attempt to adjust sedation as below Start to push SBT / PSV when we can have him more awake Albuterol prn.   CARDIOVASCULAR A:  Shock, principally hemorrhagic. Component of volume shifts given continuous dialysis  Paroxysmal atrial fibrillation History of hypertension P:  Work on weaning norepi and vasopressin, especially if we can decrease sedation.  Goal I=O, may require restart CVVHD as below Following Hgb continue hydrocort while on pressors.  Holding off on any anti-coag for now given life threatening bleed, will ned to address as we go forward.   RENAL A:   Acute renal failure, presumed due to ATN in the setting of hemorrhagic shock P:   Appreciate nephrology assistance Continue off CVVHD for now, has tolerated volume off. Likely need to restart 3/23 unless significant improvement next 24h.  Line change 3/22  GASTROINTESTINAL A:   Large retroperitoneal bleed noted, some slight increased 3/18 CT scan, hemoglobin currently stable Shock liver, transaminitis, improving Hepatic lesion; noted on CT scan of the abdomen 3/18. 5 cm lesion of the dome of the liver, could represent blood or infection or infarct.  Nutrition P:   Follow CBC, LFT intermittently  Transfusion goal Hgb > 7 Continue PPI Following lesion dome of liver. May need to re-image, consider empiric rx abx especially if he is unable to  come off pressors today.  Restart TF 3/22, will place coretrak to facilitate  HEMATOLOGIC A:   Acute blood loss anemia, improved Severe heparin sensitivity, associated with hemorrhage P:  No more heparin products at all  Continue to hold anticoag for now.  SCD  INFECTIOUS A:   H. influenzae pneumonia, treated Septic shock ? Clinical significance hepatic lesion, ? Possible abscess vs blood P:   Abx for PNA completed Consider restart empiric abx based on continued shock state Follow WBC,  fever  ENDOCRINE A:   Hyperglycemia, on steroids Hypothyroidism P:   CBG and SSi Synthroid  NEUROLOGIC A:   Toxic metabolic encephalopathy History of CVA with residual deficits P:   RASS goal: -1 to 0 Will need to transition away from current dose dilaudid Add precedex 3/23 and decrease the dilaudid If no wake up when transitioning sedation then consider brain imaging.    FAMILY DISCUSSIONS:  Full code. Family very upset at outcome, bleeding - see 05/14/16 and 05/15/16 High mortality situation and defnitely high morbidity situation  On 05/17/16 - daughter at bedside: very upset that Iv Heparin gtt was given. Says that in 2013 for A Fib stroke admit got IV heparin gtt and bled around heart. Upset to heart hat this is not in record because [facility] was [facility]. She says other anticoagulants including coumadin fine but not IV heparing ttt. Reports that she informed both at [facility] and [facility] to staff that patient should never get IV heparin gttt (she did this a priori per her). Upset that we did not take this into account and list in allergy list and still gave IV heparin gtt. REports that staff used words like "Trial and Error".   Updated by RN by phone 3/16-3/20  06/03/2016 - Daughter, Granddaughter and extended family updated by Dr Delton Coombes. Current issues and plans outlined. All questions answered. They are hopeful that resp failure and hemodynamics are reversible problems, and I agree that this may be possible. I have cautioned that I am less sure that he will have a renal recovery. Will continue to update regularly.   - Inter-disciplinary family meet or Palliative Care meeting due by:  Ongoing   CODE STATUS Full Code  DISPO Keep in ICU    Independent CC time 34 minutes  Levy Pupa, MD, PhD 05/25/2016, 9:16 AM [facility] [phone] or if no answer [phone]

## 2016-05-25 NOTE — Progress Notes (Signed)
Patient ID: Omar [name], male   DOB: 1945-05-24, 71 y.o.   MRN: [id number]   [facility] Progress Note   Assessment/ Plan:   1. Acute kidney injury: Likely ATN from hemorrhagic shock. He remains anuric and without any evidence of renal recovery- on CRRT since 3/12- held on 3/21.  Significantly volume overloaded on physical exam but also hypoalbuminemic likely third spacing. Had been successful with ultrafiltration at net -50  -100 mL per hour for a while- now with hypotension and FI02 of 40 as well as CVP of 10 but still with edema up to the abdominal wall.  Baseline kidney function relatively normal.  BUN and creatinine up some not surprisingly but no urgent needs for HD-  Will keep off today-give lasix ?  also line is 30 days old today- will talk to CCM about changing- if no improvements in UOP or hemodynamics will need resumption of CRRT tomorrow- if BP better maybe IHD 2. Hemorrhagic shock from acute retroperitoneal bleed: Remains on pressors with limited ability to wean off - stable  3. Acute blood loss anemia secondary to retroperitoneal hemorrhage: Hemoglobin/hematocrit  Down again.  Transfusion of one unit 3/19. Have added aranesp.  No systemic heparin with CRRT - may need another transfusion 4. Elytes- stable  5. VDRF- s/p trach 3/21 -  6. BP/VOL- hypotensive- CVP 10- 100%sat on 40 FIO2 but significant edema on exam- albumin 2.8.  Successful with UF on machine but will continue to hold today and see what UOP does - can steroids be weaned ?  Subjective:   CRRT held from mid day yesterday due to trach and I wanted to see what UOP/BP would do off of it.  Remains oliguric and hypotensive- actually pressors up :(    Objective:   BP 114/71   Pulse 72   Temp 98.3 F (36.8 C) (Oral)   Resp 20   Ht '5\' 6"'  (1.676 m)   Wt (!) 161.9 kg (356 lb 14.8 oz)   SpO2 96%   BMI 57.61 kg/m   Intake/Output Summary (Last 24 hours) at 05/25/16 0729 Last data filed at 05/25/16 0600  Gross per 24 hour  Intake           827.74 ml  Output              859 ml  Net           -31.26 ml   Weight change: -0.034 kg (-1.2 oz)  Physical Exam: RWP:[id number], sedated, morbidly obese man.  Right IJ vascath placed 3/12  CVS: Pulse regular rhythm, Normal rate, S1 and S2 normal Resp: Diminished breath sounds bilaterally-no distinct rales or rhonchi Abd: Soft, obese, nontender Ext: 2-3+ upper and lower extremity edema, scrotal edema noted  Imaging: Dg Chest Port 1 View  Result Date: 05/25/2016 CLINICAL DATA:  Respiratory failure. EXAM: PORTABLE CHEST 1 VIEW COMPARISON:  05/23/2016. FINDINGS: Surgical clips right neck. Interim removal of endotracheal tube. Tracheostomy tube noted in good anatomic position. Interim removal of NG tube . Bilateral IJ lines in stable position. Cardiomegaly with diffuse bilateral pulmonary infiltrates consistent pulmonary edema again noted. No interim change. Small bilateral pleural effusions. No pneumothorax . IMPRESSION: 1. Interim removal of endotracheal tube. Tracheostomy tube noted in good anatomic position. Interim removal of NG tube. Bilateral IJ lines in stable position. 2. Cardiomegaly with diffuse bilateral pulmonary infiltrates consistent pulmonary edema. Bilateral pneumonia cannot be excluded. Small bilateral pleural effusions. Similar findings noted on prior exam . Electronically Signed  By: [place]   On: 05/25/2016 07:06    Labs: BMET  Recent Labs Lab 05/21/16 1901 05/22/16 0001 05/22/16 0430 05/22/16 1600 05/23/16 0416 05/23/16 1515 05/04/2016 0400 05/25/16 0430  NA 133* 135 136 135 134* 135 136 136  K 4.1 4.3 4.0 3.9 3.9 3.9 4.0 3.9  CL 99* 101 101 101 100* 102 101 103  CO2 '25 27 27 26 27 26 28 27  ' GLUCOSE 125* 123* 121* 125* 125* 128* 130* 121*  BUN 24* 22* '20 20 18 16 18 ' 30*  CREATININE 1.51* 1.43* 1.42* 1.48* 1.40* 1.36* 1.41* 2.27*  CALCIUM 8.4* 8.3* 8.5* 8.5* 8.6* 8.5* 8.7* 8.7*  PHOS 2.8  --  2.8 2.9 2.8 2.4* 2.3*  2.7   CBC  Recent Labs Lab 05/22/16 0430 05/23/16 0415 05/19/2016 0400 05/25/16 0430  WBC 8.8 8.4 10.2 11.8*  NEUTROABS 6.9 6.7 8.2* 10.3*  HGB 8.1* 8.0* 8.2* 7.3*  HCT 25.1* 25.4* 26.3* 23.2*  MCV 99.2 100.0 103.5* 103.1*  PLT 67* 69* 65* 67*    Medications:    . sodium chloride   Intravenous Once  . chlorhexidine gluconate (MEDLINE KIT)  15 mL Mouth Rinse BID  . Chlorhexidine Gluconate Cloth  6 each Topical Daily  . darbepoetin (ARANESP) injection - NON-DIALYSIS  150 mcg Subcutaneous Q Mon-1800  . feeding supplement (PRO-STAT SUGAR FREE 64)  30 mL Per Tube QID  . hydrocortisone sod succinate (SOLU-CORTEF) inj  50 mg Intravenous Q6H  . insulin aspart  0-9 Units Subcutaneous Q4H  . levothyroxine  125 mcg Per Tube QAC breakfast  . mouth rinse  15 mL Mouth Rinse QID  . pantoprazole (PROTONIX) IV  40 mg Intravenous Q24H  . polyethylene glycol  17 g Oral Daily   Jaylyne Breese A  05/25/2016, 7:29 AM

## 2016-05-25 NOTE — Anesthesia Postprocedure Evaluation (Addendum)
Anesthesia Post Note  Patient: Omar [name]  Procedure(s) Performed: Procedure(s) (LRB): TRACHEOSTOMY (N/A)  Patient location during evaluation: PACU Anesthesia Type: General Level of consciousness: patient remains intubated per anesthesia plan Pain management: pain level controlled Vital Signs Assessment: post-procedure vital signs reviewed and stable Respiratory status: patient remains intubated per anesthesia plan Cardiovascular status: stable Postop Assessment: no signs of nausea or vomiting Anesthetic complications: no        Last Vitals:  Vitals:   05/25/16 1000 05/25/16 1015  BP: (!) 85/57 118/87  Pulse: 85 (!) 124  Resp: 20 19  Temp:      Last Pain:  Vitals:   05/25/16 0726  TempSrc: Oral  PainSc:    Pain Goal:                 Omar [name],Omar [name]

## 2016-05-26 ENCOUNTER — Inpatient Hospital Stay (HOSPITAL_COMMUNITY): Payer: Medicare Other

## 2016-05-26 LAB — GLUCOSE, CAPILLARY
GLUCOSE-CAPILLARY: 135 mg/dL — AB (ref 65–99)
Glucose-Capillary: 143 mg/dL — ABNORMAL HIGH (ref 65–99)
Glucose-Capillary: 135 mg/dL — ABNORMAL HIGH (ref 65–99)
Glucose-Capillary: 188 mg/dL — ABNORMAL HIGH (ref 65–99)
Glucose-Capillary: 136 mg/dL — ABNORMAL HIGH (ref 65–99)
Glucose-Capillary: 147 mg/dL — ABNORMAL HIGH (ref 65–99)

## 2016-05-26 LAB — RENAL FUNCTION PANEL
Albumin: 2.5 g/dL — ABNORMAL LOW (ref 3.5–5.0)
Anion gap: 9 (ref 5–15)
BUN: 51 mg/dL — AB (ref 6–20)
CHLORIDE: 101 mmol/L (ref 101–111)
CO2: 25 mmol/L (ref 22–32)
CREATININE: 3.02 mg/dL — AB (ref 0.61–1.24)
Calcium: 8.8 mg/dL — ABNORMAL LOW (ref 8.9–10.3)
GFR calc Af Amer: 22 mL/min — ABNORMAL LOW (ref >60)
GFR, EST NON AFRICAN AMERICAN: 19 mL/min — AB (ref >60)
Glucose, Bld: 141 mg/dL — ABNORMAL HIGH (ref 65–99)
POTASSIUM: 3.6 mmol/L (ref 3.5–5.1)
Phosphorus: 3.3 mg/dL (ref 2.5–4.6)
Sodium: 135 mmol/L (ref 135–145)

## 2016-05-26 LAB — CBC
HCT: 24.9 % — ABNORMAL LOW (ref 39.0–52.0)
HEMOGLOBIN: 8.2 g/dL — AB (ref 13.0–17.0)
MCH: 34.3 pg — ABNORMAL HIGH (ref 26.0–34.0)
MCHC: 32.9 g/dL (ref 30.0–36.0)
MCV: 104.2 fL — AB (ref 78.0–100.0)
Platelets: 80 10*3/uL — ABNORMAL LOW (ref 150–400)
RBC: 2.39 MIL/uL — ABNORMAL LOW (ref 4.22–5.81)
WBC: 10.6 10*3/uL — AB (ref 4.0–10.5)

## 2016-05-26 LAB — MAGNESIUM: MAGNESIUM: 2.4 mg/dL (ref 1.7–2.4)

## 2016-05-26 LAB — LACTIC ACID, PLASMA: Lactic Acid, Venous: 1.6 mmol/L (ref 0.5–1.9)

## 2016-05-26 MED ORDER — MIDAZOLAM HCL 2 MG/2ML IJ SOLN
2.0000 mg | Freq: Once | INTRAMUSCULAR | Status: AC
  Filled 2016-05-26: qty 2

## 2016-05-26 MED ORDER — DEXTROSE 5 % IV SOLN
1.0000 g | INTRAVENOUS | Status: DC
  Administered 2016-05-26: 1 g via INTRAVENOUS
  Filled 2016-05-26 (×2): qty 1

## 2016-05-26 MED ORDER — MIDAZOLAM HCL 2 MG/2ML IJ SOLN
2.0000 mg | Freq: Once | INTRAMUSCULAR | Status: AC
  Administered 2016-05-26: 2 mg via INTRAVENOUS

## 2016-05-26 MED ORDER — VANCOMYCIN HCL 10 G IV SOLR
2500.0000 mg | Freq: Once | INTRAVENOUS | Status: AC
  Administered 2016-05-26: 2500 mg via INTRAVENOUS
  Filled 2016-05-26: qty 2500

## 2016-05-26 MED ORDER — FUROSEMIDE 10 MG/ML IJ SOLN
160.0000 mg | Freq: Two times a day (BID) | INTRAVENOUS | Status: DC
  Administered 2016-05-26 – 2016-05-30 (×9): 160 mg via INTRAVENOUS
  Filled 2016-05-26 (×11): qty 16

## 2016-05-26 MED ORDER — POTASSIUM CHLORIDE 20 MEQ/15ML (10%) PO SOLN
40.0000 meq | Freq: Once | ORAL | Status: AC
  Administered 2016-05-26: 40 meq
  Filled 2016-05-26: qty 30

## 2016-05-26 NOTE — Progress Notes (Signed)
Nutrition Follow-up  DOCUMENTATION CODES:   Morbid obesity  INTERVENTION:    Continue Vital High Protein at 50 ml/h (1200 ml per day)  Pro-stat 30 ml QID  Provides 1600kcal, 165gm protein, 126m free water daily  NUTRITION DIAGNOSIS:   Inadequate oral intake related to inability to eat as evidenced by NPO status.  Ongoing  GOAL:   Provide needs based on ASPEN/SCCM guidelines  Met  MONITOR:   Vent status, TF tolerance, I & O's, Labs  ASSESSMENT:   71y/o male from [facility] admitted 3/5 with altered mental status with aspiration, sepsis, hypoxic/hypercapnic respiratory failure.    Discussed patient with RN today. S/P tracheostomy 3/21. CRRT off since 3/21. HD catheter placed today. Nephrology following for dialysis needs. Cortrak tube placed 3/22; tip in the stomach.  Currently receiving Vital High Protein via Cortrak tube at 50 ml/h (1200 ml/day) with Prostat 30 ml QID to provide 1600 kcals, 165 gm protein, 1204 ml free water daily.  Patient remains on ventilator support Temp (24hrs), Avg:98.1 F (36.7 C), Min:97.4 F (36.3 C), Max:99.2 F (37.3 C)  Propofol: none Labs reviewed. CBG's: 135-188 Medications reviewed and include Lasix.  Diet Order:  Diet NPO time specified  Skin:  Reviewed, no issues  Last BM:  3/23 (rectal tube)  Height:   Ht Readings from Last 1 Encounters:  05/09/16 '5\' 6"'  (1.676 m)    Weight:   Wt Readings from Last 1 Encounters:  05/26/16 (!) 346 lb (156.9 kg)    Ideal Body Weight:  64.5 kg  BMI:  Body mass index is 55.85 kg/m.  Estimated Nutritional Needs:   Kcal:  [address] Protein:  161 gm  Fluid:  2-2.5 L  EDUCATION NEEDS:   No education needs identified at this time  KMolli Barrows RLuverne LLas Piedras CJeffersontownPager [phone] Hours Pager [phone]

## 2016-05-26 NOTE — Progress Notes (Signed)
PCCM Progress Note  Admission date: 05/18/2016 Referring provider: Dr[facility] Claren[facility]Manus[facility]  CC: altered mental status  HPI: 71 y/o male from [facility] admitted 3/5 with altered mental status with aspiration, sepsis, hypoxic/hypercapnic respiratory failure. Course c/b retroperitoneal bleed, shock, acute renal failure   has a past medical history of Asthma; Atrial fibrillation (HCC); Gout; Hypertension; Morbid obesity (HCC); Multinodular goiter; Osteoarthrosis and allied disorders; Other and unspecified hyperlipidemia; Stroke [facility](HCC); Thrombocytopenia (HCC); and Vitamin D deficiency.   Antibiotics: Zosyn 3/06 >> 3/08 Rocephin (H.Flu PNA) 3/08 >> 3/14  Cultures: Urine 3/05 >> negative Blood 3/06 >> negative Resp 3/6 >> H flu (b-lactamase +)   Lines/tubes: ETT 3/05 >> 3/8 ETT 3/10 >> 3/21 Trach (Dr Jenne PaneBates) 3/21 >>  Lt IJ CVL 3/07 >>  R HD catheter  >> 3/22  Events: 3/05  [facility] S[facility]um 3/06 >> Haemophilus influenzae (beta lactamase positive) Echo 3/06 >> EF 65 to 70%, mod LVH, mod TR, PAS 39 mmHg CT head 3/7 >> no acute abnormality    SUBJECTIVE/OVERNIGHT/INTERVAL HX Precedex added, mental status may be clearing slightly Urine output for the last 24 hours approximate 1 L  Vital signs: BP 108/66   Pulse 83   Temp 97.9 F (36.6 C) (Axillary)   Resp 16   Ht 5\' 6"  (1.676 m)   Wt (!) 156.9 kg (346 lb)   SpO2 95%   BMI 55.85 kg/m   Intake/output:  Intake/Output Summary (Last 24 hours) at 05/26/16 1029 Last data filed at 05/26/16 1000  Gross per 24 hour  Intake          2772.76 ml  Output             1099 ml  Net          1673.76 ml   I/O last 3 completed shifts: In: 2793.7 [I.V.:1188.4; Other:20; NG/GT:1453.3; IV Piggyback:132] Out: 1106 [Urine:1106]  EXAM Gen: Obese man, some agitation and spontaneous nonpurposeful movement  ENT: Tracheostomy in place, no oral lesions, orbital edema  Neck: Unable to assess JVD, trach site clean dry and intact  Lungs: Difficult  exam, clear anteriorly decreased at bases  Cardiovascular: Regular, tachycardic  Abdomen: Soft, obese, hypoactive bowel sounds  Musculoskeletal: No deformity  Neuro: Spontaneous movement of left upper extremity, may have turned head to voice today, does not track or follow commands  Skin: Per RN some evidence of skin breakdown and perineal area, rectal tube inserted 3/23   LABS  PULMONARY No results for input(s): PHART, PCO2ART, PO2ART, HCO3, TCO2, O2SAT in the last 168 hours.  Invalid input(s): PCO2, PO2  CBC  Recent Labs Lab 05/29/2016 0400 05/25/16 0430 05/26/16 0411  HGB 8.2* 7.3* 8.2*  HCT 26.3* 23.2* 24.9*  WBC 10.2 11.8* 10.6*  PLT 65* 67* 80*    COAGULATION  Recent Labs Lab 05/21/16 0400 05/22/16 0430 05/23/16 0415 05/17/2016 0400 05/25/16 0430  INR 1.36 1.28 1.37 1.35 1.45    CARDIAC   No results for input(s): TROPONINI in the last 168 hours. No results for input(s): PROBNP in the last 168 hours.   CHEMISTRY  Recent Labs Lab 05/22/16 0430  05/23/16 0415 05/23/16 0416 05/23/16 1515 05/31/2016 0400 05/25/16 0430 05/26/16 0411 05/26/16 0412  NA 136  < >  --  134* 135 136 136  --  135  K 4.0  < >  --  3.9 3.9 4.0 3.9  --  3.6  CL 101  < >  --  100* 102 101 103  --  101  CO2 27  < >  --  27 26 28 27   --  25  GLUCOSE 121*  < >  --  125* 128* 130* 121*  --  141*  BUN 20  < >  --  18 16 18  30*  --  51*  CREATININE 1.42*  < >  --  1.40* 1.36* 1.41* 2.27*  --  3.02*  CALCIUM 8.5*  < >  --  8.6* 8.5* 8.7* 8.7*  --  8.8*  MG 2.3  --  2.3  --   --  2.4 2.4 2.4  --   PHOS 2.8  < >  --  2.8 2.4* 2.3* 2.7  --  3.3  < > = values in this interval not displayed. Estimated Creatinine Clearance: 31.6 mL/min (A) (by C-G formula based on SCr of 3.02 mg/dL (H)).   LIVER  Recent Labs Lab 05/20/16 0405  05/21/16 0400  05/22/16 0430  05/23/16 0415 05/23/16 0416 05/23/16 1515 05/21/2016 0400 05/25/16 0430 05/26/16 0412  AST 163*  --   --   --  69*  --   56*  --   --   --   --   --   ALT 515*  --   --   --  276*  --  225*  --   --   --   --   --   ALKPHOS 141*  --   --   --  129*  --  121  --   --   --   --   --   BILITOT 5.6*  --   --   --  6.2*  --  6.2*  --   --   --   --   --   PROT 6.7  --   --   --  7.0  --  7.1  --   --   --   --   --   ALBUMIN 2.5*  2.5*  < > 2.3*  < > 2.7*  2.7*  < > 2.8* 2.8* 2.8* 2.8* 2.4* 2.5*  INR 1.41  --  1.36  --  1.28  --  1.37  --   --  1.35 1.45  --   < > = values in this interval not displayed.   INFECTIOUS  Recent Labs Lab 05/26/16 0413  LATICACIDVEN 1.6     ENDOCRINE CBG (last 3)   Recent Labs  05/25/16 2355 05/26/16 0333 05/26/16 0746  GLUCAP 144* 143* 135*     IMAGING x48h  - image(s) personally visualized  -   highlighted in bold Dg Chest Port 1 View  Result Date: 05/25/2016 CLINICAL DATA:  Respiratory failure. EXAM: PORTABLE CHEST 1 VIEW COMPARISON:  05/23/2016. FINDINGS: Surgical clips right neck. Interim removal of endotracheal tube. Tracheostomy tube noted in good anatomic position. Interim removal of NG tube . Bilateral IJ lines in stable position. Cardiomegaly with diffuse bilateral pulmonary infiltrates consistent pulmonary edema again noted. No interim change. Small bilateral pleural effusions. No pneumothorax . IMPRESSION: 1. Interim removal of endotracheal tube. Tracheostomy tube noted in good anatomic position. Interim removal of NG tube. Bilateral IJ lines in stable position. 2. Cardiomegaly with diffuse bilateral pulmonary infiltrates consistent pulmonary edema. Bilateral pneumonia cannot be excluded. Small bilateral pleural effusions. Similar findings noted on prior exam . Electronically Signed   By: Maisie Fus  Register   On: 05/25/2016 07:06   Dg Abd Portable 1v  Result Date: 05/25/2016 CLINICAL DATA:  Feeding tube placement EXAM: PORTABLE ABDOMEN - 1 VIEW COMPARISON:  None. FINDINGS: Feeding tube coils in the fundus of the stomach with the tip in the mid portion of the  stomach. IMPRESSION: Feeding tube coils in the fundus of the stomach. Electronically Signed   By: Charlett Nose M.D.   On: 05/25/2016 10:45    DISCUSSION: 71 year old man, history of fibrillation, obesity, admitted with respiratory failure and septic shock in the setting of H flu pneumonia. Subsequent course complicated by retroperitoneal bleeding on anticoagulation, hemorrhagic shock, acute respiratory failure and acute renal failure. Currently mechanically ventilated, on CVVHD. Pressor needs improving slowly. s/p trach   ASSESSMENT / PLAN:  PULMONARY A: Acute respiratory failure Pneumonia, H influenza (beta lactamase positive) Presumed OSA/OHS History of asthma Bilateral pulm infiltrates / effusions, consistent w volume overload P:   Decreasing sedation to hopefully work towards spontaneous breathing Albuterol when necessary  CARDIOVASCULAR A:  Shock, principally hemorrhagic. Component of volume shifts given continuous dialysis  Paroxysmal atrial fibrillation History of hypertension P:  Continue to wean norepi, stop vasopressin off today Goal will be to continue to have volume removal Follow hemoglobin Continue stress dose steroids until we are able to get off pressors Continue to hold anticoagulation given life-threatening bleeding. We will need to address restarting at some point in the future  RENAL A:   Acute renal failure, presumed due to ATN in the setting of hemorrhagic shock P:   Appreciate nephrology assistance Continue off CVVHD for now. He had good urine output last 24 hours with Lasix. Hopefully his kidney function will continue to rebound. We will replace an HD catheter today in the expectation that he may need some additional dialysis treatments  GASTROINTESTINAL A:   Large retroperitoneal bleed noted, some slight increased 3/18 CT scan, hemoglobin currently stable Shock liver, transaminitis, improving Hepatic lesion; noted on CT scan of the abdomen 3/18. 5 cm  lesion of the dome of the liver, could represent blood or infection or infarct.  Nutrition P:   Follow LFTs, CBC Transfusion goal hemoglobin greater than 7 Continue Protonix Following lesion dome of liver. May need to re-image, consider empiric rx abx especially if he is unable to come off pressors.  Continue tube feeding Continue rectal tube for now  HEMATOLOGIC A:   Acute blood loss anemia, improved Severe heparin sensitivity, associated with hemorrhage P:  No more heparin products, placed on allergy list Holding anticoagulation as outlined above SCD for DVT prophylaxis  INFECTIOUS A:   H. influenzae pneumonia, treated Septic shock ? Clinical significance hepatic lesion, ? Possible abscess vs blood P:   Antibiotics for pneumonia have been completed Continue to Consider restart empiric abx based on continued shock state, avoid at this time.  Follow WBC, fever, clinical status  ENDOCRINE A:   Hyperglycemia, on steroids Hypothyroidism P:   Continue sliding scale insulin, CBG Continue Synthroid  NEUROLOGIC A:   Toxic metabolic encephalopathy History of CVA with residual deficits P:   RASS goal: -1 to 0 Continue to decrease narcotics, continue Precedex, may be able to wean this as well If he fails to clear his mental status over the next few days then I will repeat his brain imaging   FAMILY DISCUSSIONS:  Full code. Family very upset at outcome, bleeding - see 05/14/16 and 05/15/16 High mortality situation and defnitely high morbidity situation  On 05/17/16 - daughter at bedside: very upset that Iv Heparin gtt was given. Says that in 2013 for A Fib stroke admit got  IV heparin gtt and bled around heart. Upset to heart hat this is not in record because [facility] was [facility]. She says other anticoagulants including coumadin fine but not IV heparing ttt. Reports that she informed both at [facility] and [facility] to staff that patient should never get IV heparin gttt (she did this a  priori per her). Upset that we did not take this into account and list in allergy list and still gave IV heparin gtt. REports that staff used words like "Trial and Error".   Updated by RN by phone 3/16-3/20  05/08/2016 - Daughter, Granddaughter and extended family updated by Dr Delton Coombes. Current issues and plans outlined. All questions answered. They are hopeful that resp failure and hemodynamics are reversible problems, and I agree that this may be possible. I have cautioned that I am less sure that he will have a renal recovery. Will continue to update regularly.   - Inter-disciplinary family meet or Palliative Care meeting due by:  Ongoing   CODE STATUS Full Code  DISPO Keep in ICU    Independent CC time 33 minutes  Levy Pupa, MD, PhD 05/26/2016, 10:29 AM [facility] [phone] or if no answer [phone]

## 2016-05-26 NOTE — Progress Notes (Signed)
   Subjective:    Patient ID: Omar FellingWilliam R Garraway, male    DOB: 1945/10/09, 71 y.o.   MRN: [id number]  HPI Remains on ventilator.  Review of Systems     Objective:   Physical Exam Sedated on mechanical ventilator. #8 cuffed Shiley in place with sutures in place.  Four strips of Vaseline gauze removed.  Malodorous.     Assessment & Plan:  Respiratory failure s/p tracheostomy  Gauze packing removed.  Continue trach care.  Will remove sutures early next week.

## 2016-05-26 NOTE — Progress Notes (Signed)
Patient ID: Omar [name], male   DOB: 11/13/1945, 71 y.o.   MRN: [id number]  [facility] Progress Note   Assessment/ Plan:   1. Acute kidney injury: Likely ATN from hemorrhagic shock. He was anuric - on CRRT from 3/12-  3/21.  Significantly volume overloaded on physical exam but also hypoalbuminemic likely third spacing. CRRT stopped when more hypotensive and FI02 of 40 as well as CVP of 10 but still with edema up to the abdominal wall.  Baseline kidney function relatively normal.  BUN and creatinine up some not surprisingly but no urgent needs for HD-  Will keep off today-continue lasix  - vascath removed (was 70 days old) I want to give him another day to see what he does 2. Hemorrhagic shock from acute retroperitoneal bleed: Remains on pressors with limited ability to wean off - stable to imporved 3. Acute blood loss anemia secondary to retroperitoneal hemorrhage: Hemoglobin/hematocrit  Down again.  Transfusion of one unit 3/18. Have added aranesp.   4. Elytes- stable  5. VDRF- s/p trach 3/21 -  6. BP/VOL- hypotensive- CVP 10- 100%sat on 40 FIO2 but significant edema on exam- albumin 2.8.  Making urine- continue to hold dialysis  and see what UOP does   Subjective:   CRRT held from 3/21 due to trach and I wanted to see what UOP/BP would do off of it.  He made a liter of urine ! But BUN and creatinine up some   Objective:   BP 100/74 (BP Location: Left Arm)   Pulse 89   Temp 97.9 F (36.6 C) (Axillary)   Resp 20   Ht '5\' 6"'  (1.676 m)   Wt (!) 156.9 kg (346 lb)   SpO2 96%   BMI 55.85 kg/m   Intake/Output Summary (Last 24 hours) at 05/26/16 0835 Last data filed at 05/26/16 0800  Gross per 24 hour  Intake          2520.94 ml  Output             1009 ml  Net          1511.94 ml   Weight change: -4.956 kg (-10 lb 14.8 oz)  Physical Exam: [id number], sedated, morbidly obese man.  Right IJ vascath placed 3/12 - removed CVS: Pulse regular rhythm, Normal rate, S1  and S2 normal Resp: Diminished breath sounds bilaterally-no distinct rales or rhonchi Abd: Soft, obese, nontender Ext: 2-3+ upper and lower extremity edema, scrotal edema noted  Imaging: Dg Chest Port 1 View  Result Date: 05/25/2016 CLINICAL DATA:  Respiratory failure. EXAM: PORTABLE CHEST 1 VIEW COMPARISON:  05/23/2016. FINDINGS: Surgical clips right neck. Interim removal of endotracheal tube. Tracheostomy tube noted in good anatomic position. Interim removal of NG tube . Bilateral IJ lines in stable position. Cardiomegaly with diffuse bilateral pulmonary infiltrates consistent pulmonary edema again noted. No interim change. Small bilateral pleural effusions. No pneumothorax . IMPRESSION: 1. Interim removal of endotracheal tube. Tracheostomy tube noted in good anatomic position. Interim removal of NG tube. Bilateral IJ lines in stable position. 2. Cardiomegaly with diffuse bilateral pulmonary infiltrates consistent pulmonary edema. Bilateral pneumonia cannot be excluded. Small bilateral pleural effusions. Similar findings noted on prior exam . Electronically Signed   By: [place]   On: 05/25/2016 07:06   Dg Abd Portable 1v  Result Date: 05/25/2016 CLINICAL DATA:  Feeding tube placement EXAM: PORTABLE ABDOMEN - 1 VIEW COMPARISON:  None. FINDINGS: Feeding tube coils in the fundus of the stomach with  the tip in the mid portion of the stomach. IMPRESSION: Feeding tube coils in the fundus of the stomach. Electronically Signed   By: Rolm Baptise M.D.   On: 05/25/2016 10:45    Labs: BMET  Recent Labs Lab 05/22/16 0430 05/22/16 1600 05/23/16 0416 05/23/16 1515 05/20/2016 0400 05/25/16 0430 05/26/16 0412  NA 136 135 134* 135 136 136 135  K 4.0 3.9 3.9 3.9 4.0 3.9 3.6  CL 101 101 100* 102 101 103 101  CO2 '27 26 27 26 28 27 25  ' GLUCOSE 121* 125* 125* 128* 130* 121* 141*  BUN '20 20 18 16 18 ' 30* 51*  CREATININE 1.42* 1.48* 1.40* 1.36* 1.41* 2.27* 3.02*  CALCIUM 8.5* 8.5* 8.6* 8.5* 8.7*  8.7* 8.8*  PHOS 2.8 2.9 2.8 2.4* 2.3* 2.7 3.3   CBC  Recent Labs Lab 05/22/16 0430 05/23/16 0415 05/21/2016 0400 05/25/16 0430 05/26/16 0411  WBC 8.8 8.4 10.2 11.8* 10.6*  NEUTROABS 6.9 6.7 8.2* 10.3*  --   HGB 8.1* 8.0* 8.2* 7.3* 8.2*  HCT 25.1* 25.4* 26.3* 23.2* 24.9*  MCV 99.2 100.0 103.5* 103.1* 104.2*  PLT 67* 69* 65* 67* 80*    Medications:    . sodium chloride   Intravenous Once  . chlorhexidine gluconate (MEDLINE KIT)  15 mL Mouth Rinse BID  . Chlorhexidine Gluconate Cloth  6 each Topical Daily  . darbepoetin (ARANESP) injection - NON-DIALYSIS  150 mcg Subcutaneous Q Mon-1800  . feeding supplement (PRO-STAT SUGAR FREE 64)  30 mL Per Tube QID  . hydrocortisone sod succinate (SOLU-CORTEF) inj  50 mg Intravenous Q6H  . insulin aspart  0-9 Units Subcutaneous Q4H  . levothyroxine  125 mcg Per Tube QAC breakfast  . mouth rinse  15 mL Mouth Rinse QID  . pantoprazole sodium  40 mg Per Tube Daily  . polyethylene glycol  17 g Oral Daily   Baldomero Mirarchi A  05/26/2016, 8:35 AM

## 2016-05-26 NOTE — Progress Notes (Signed)
eLink Physician-Brief Progress Note Patient Name: Omar FellingWilliam R Gillian DOB: 1945/10/05 MRN: [id number]   Date of Service  05/26/2016  HPI/Events of Note  Notified by bedside nurse the patient is beginning to have significant skin breakdown in his perineum due to frequent, loose stools. Patient's weight makes it difficult to clean him frequently and effectively. Platelet count 80,000.   eICU Interventions  Verbal order given for rectal tube.      Intervention Category Intermediate Interventions: Other:  Lawanda CousinsJennings Seung Nidiffer 05/26/2016, 5:18 AM

## 2016-05-26 NOTE — Procedures (Signed)
Hemodialysis Catheter Insertion Procedure Note Ignacia FellingWilliam R Urschel [id number] 1946-03-02  Procedure: Insertion of Hemodialysis Catheter Indications: CRRT  Procedure Details Consent: Risks of procedure as well as the alternatives and risks of each were explained to the (patient/caregiver).  Consent for procedure obtained. Time Out: Verified patient identification, verified procedure, site/side was marked, verified correct patient position, special equipment/implants available, medications/allergies/relevent history reviewed, required imaging and test results available.  Performed  Maximum sterile technique was used including antiseptics, cap, gloves, gown, hand hygiene, mask and sheet. Skin prep: Chlorhexidine; local anesthetic administered A antimicrobial bonded/coated triple lumen catheter was placed in the right internal jugular vein using the Seldinger technique.  Evaluation Blood flow good Complications: No apparent complications Patient did tolerate procedure well. Chest X-ray ordered to verify placement.  CXR: pending.  Procedure performed under direct ultrasound guidance for real time vessel cannulation.      Rutherford Guysahul Carlyn Lemke, [place] - C [place] [facility] Pgr: [phone]  or [phone] 05/26/2016, 11:42 AM

## 2016-05-26 NOTE — Progress Notes (Signed)
Pharmacy Antibiotic Note  Omar [name] is a 71 y.o. male admitted on 05/24/2016 with pneumonia.    Plan: Add vanc 2500 mg x 1 (further dosing based on if he goes back on CRRT Sat) Cefepime 1 g q24h  Height: 5\' 6"  (167.6 cm) Weight: (!) 346 lb (156.9 kg) IBW/kg (Calculated) : 63.8  Temp (24hrs), Avg:98.1 F (36.7 C), Min:97.4 F (36.3 C), Max:99.2 F (37.3 C)   Recent Labs Lab 05/22/16 0430  05/23/16 0415 05/23/16 0416 05/23/16 1515 05/18/2016 0400 05/25/16 0430 05/26/16 0411 05/26/16 0412 05/26/16 0413  WBC 8.8  --  8.4  --   --  10.2 11.8* 10.6*  --   --   CREATININE 1.42*  < >  --  1.40* 1.36* 1.41* 2.27*  --  3.02*  --   LATICACIDVEN  --   --   --   --   --   --   --   --   --  1.6  < > = values in this interval not displayed.  Estimated Creatinine Clearance: 31.6 mL/min (A) (by C-G formula based on SCr of 3.02 mg/dL (H)).    Allergies  Allergen Reactions  . Anacin Af [Acetaminophen]   . Heparin     Retroperitoneal bleed in 2018, previous bleed from heparin in 2013  . Niaspan [Niacin Er]    Omar [name], PharmD, BCPS, BCCCP Clinical Pharmacist Clinical phone for 05/26/2016 from 7a-3:30p: [phone] If after 3:30p, please call main pharmacy at: [phone] 05/26/2016 12:23 PM

## 2016-05-27 LAB — RENAL FUNCTION PANEL
ANION GAP: 8 (ref 5–15)
ANION GAP: 8 (ref 5–15)
Albumin: 2.2 g/dL — ABNORMAL LOW (ref 3.5–5.0)
Albumin: 2.5 g/dL — ABNORMAL LOW (ref 3.5–5.0)
BUN: 82 mg/dL — AB (ref 6–20)
BUN: 80 mg/dL — AB (ref 6–20)
CALCIUM: 8.7 mg/dL — AB (ref 8.9–10.3)
CHLORIDE: 103 mmol/L (ref 101–111)
CHLORIDE: 102 mmol/L (ref 101–111)
CO2: 25 mmol/L (ref 22–32)
CO2: 25 mmol/L (ref 22–32)
CREATININE: 3.45 mg/dL — AB (ref 0.61–1.24)
Calcium: 8.5 mg/dL — ABNORMAL LOW (ref 8.9–10.3)
Creatinine, Ser: 2.87 mg/dL — ABNORMAL HIGH (ref 0.61–1.24)
GFR calc Af Amer: 24 mL/min — ABNORMAL LOW (ref >60)
GFR calc non Af Amer: 20 mL/min — ABNORMAL LOW (ref >60)
GFR, EST AFRICAN AMERICAN: 19 mL/min — AB (ref >60)
GFR, EST NON AFRICAN AMERICAN: 16 mL/min — AB (ref >60)
GLUCOSE: 151 mg/dL — AB (ref 65–99)
Glucose, Bld: 152 mg/dL — ABNORMAL HIGH (ref 65–99)
POTASSIUM: 3.7 mmol/L (ref 3.5–5.1)
Phosphorus: 3 mg/dL (ref 2.5–4.6)
Phosphorus: 2.5 mg/dL (ref 2.5–4.6)
Potassium: 3.7 mmol/L (ref 3.5–5.1)
Sodium: 136 mmol/L (ref 135–145)
Sodium: 135 mmol/L (ref 135–145)

## 2016-05-27 LAB — CBC
HEMATOCRIT: 26 % — AB (ref 39.0–52.0)
Hemoglobin: 8.2 g/dL — ABNORMAL LOW (ref 13.0–17.0)
MCH: 32.8 pg (ref 26.0–34.0)
MCHC: 31.5 g/dL (ref 30.0–36.0)
MCV: 104 fL — AB (ref 78.0–100.0)
Platelets: 96 10*3/uL — ABNORMAL LOW (ref 150–400)
RBC: 2.5 MIL/uL — ABNORMAL LOW (ref 4.22–5.81)
WBC: 20.9 10*3/uL — ABNORMAL HIGH (ref 4.0–10.5)

## 2016-05-27 LAB — GLUCOSE, CAPILLARY
GLUCOSE-CAPILLARY: 129 mg/dL — AB (ref 65–99)
Glucose-Capillary: 130 mg/dL — ABNORMAL HIGH (ref 65–99)
Glucose-Capillary: 140 mg/dL — ABNORMAL HIGH (ref 65–99)
Glucose-Capillary: 144 mg/dL — ABNORMAL HIGH (ref 65–99)
Glucose-Capillary: 147 mg/dL — ABNORMAL HIGH (ref 65–99)
Glucose-Capillary: 152 mg/dL — ABNORMAL HIGH (ref 65–99)

## 2016-05-27 LAB — MAGNESIUM: MAGNESIUM: 2.1 mg/dL (ref 1.7–2.4)

## 2016-05-27 MED ORDER — PRISMASOL BGK 4/2.5 32-4-2.5 MEQ/L IV SOLN
INTRAVENOUS | Status: DC
  Administered 2016-05-27 – 2016-05-30 (×5): via INTRAVENOUS_CENTRAL
  Filled 2016-05-27 (×8): qty 5000

## 2016-05-27 MED ORDER — PRISMASOL BGK 4/2.5 32-4-2.5 MEQ/L IV SOLN
INTRAVENOUS | Status: DC
  Administered 2016-05-27 – 2016-05-30 (×14): via INTRAVENOUS_CENTRAL
  Filled 2016-05-27 (×33): qty 5000

## 2016-05-27 MED ORDER — DEXTROSE 5 % IV SOLN
2.0000 g | Freq: Two times a day (BID) | INTRAVENOUS | Status: DC
  Administered 2016-05-27 – 2016-05-30 (×6): 2 g via INTRAVENOUS
  Filled 2016-05-27 (×7): qty 2

## 2016-05-27 MED ORDER — VANCOMYCIN HCL 10 G IV SOLR
1500.0000 mg | INTRAVENOUS | Status: DC
  Administered 2016-05-27 – 2016-05-29 (×3): 1500 mg via INTRAVENOUS
  Filled 2016-05-27 (×4): qty 1500

## 2016-05-27 MED ORDER — SODIUM CHLORIDE 0.9 % FOR CRRT
INTRAVENOUS_CENTRAL | Status: DC | PRN
  Administered 2016-05-27 – 2016-05-28 (×3): via INTRAVENOUS_CENTRAL
  Filled 2016-05-27 (×4): qty 1000

## 2016-05-27 MED ORDER — SODIUM CHLORIDE 0.9 % IV SOLN
0.4000 ug/kg/h | INTRAVENOUS | Status: DC
  Administered 2016-05-27 (×4): 0.701 ug/kg/h via INTRAVENOUS
  Administered 2016-05-27: 0.4 ug/kg/h via INTRAVENOUS
  Administered 2016-05-27 – 2016-05-28 (×2): 0.7 ug/kg/h via INTRAVENOUS
  Administered 2016-05-28 (×2): 0.599 ug/kg/h via INTRAVENOUS
  Administered 2016-05-28: 0.6 ug/kg/h via INTRAVENOUS
  Administered 2016-05-29: 0.5 ug/kg/h via INTRAVENOUS
  Filled 2016-05-27 (×12): qty 4

## 2016-05-27 NOTE — Progress Notes (Signed)
Pharmacy Antibiotic Note  Omar FellingWilliam R [name] is a 71 y.o. male now with possible stomatitis - Restarting CRRT this am   Plan: Vancomycin 1500 mg q24h - next dose 1400 Cefepime 2 g q12h - next dose 1400 Adjust abx when off CRRT  Height: 5\' 6"  (167.6 cm) Weight: (!) 343 lb (155.6 kg) IBW/kg (Calculated) : 63.8  Temp (24hrs), Avg:98.7 F (37.1 C), Min:98.1 F (36.7 C), Max:99.5 F (37.5 C)   Recent Labs Lab 05/23/16 0415  05/23/16 1515 05/16/2016 0400 05/25/16 0430 05/26/16 0411 05/26/16 0412 05/26/16 0413 05/27/16 0359  WBC 8.4  --   --  10.2 11.8* 10.6*  --   --  20.9*  CREATININE  --   < > 1.36* 1.41* 2.27*  --  3.02*  --  3.45*  LATICACIDVEN  --   --   --   --   --   --   --  1.6  --   < > = values in this interval not displayed.  Estimated Creatinine Clearance: 27.5 mL/min (A) (by C-G formula based on SCr of 3.45 mg/dL (H)).    Allergies  Allergen Reactions  . Anacin Af [Acetaminophen]   . Heparin     Retroperitoneal bleed in 2018, previous bleed from heparin in 2013  . Niaspan [Niacin Er]    Omar BlissMichael Reighlynn [name], PharmD, BCPS, BCCCP Clinical Pharmacist Clinical phone for 05/27/2016 from 7a-3:30p: [phone] If after 3:30p, please call main pharmacy at: [phone] 05/27/2016 10:14 AM

## 2016-05-27 NOTE — Progress Notes (Signed)
Patient ID: Omar [name], male   DOB: 05/05/1945, 71 y.o.   MRN: [id number]  [facility] Progress Note   Assessment/ Plan:   1. Acute kidney injury: Likely ATN from hemorrhagic shock. He was anuric - on CRRT from 3/12-  3/21.  Significantly volume overloaded on physical exam but also hypoalbuminemic likely third spacing. CRRT stopped when more hypotensive and FI02 of 40 as well as CVP of 10 but still with edema up to the abdominal wall.  Baseline kidney function relatively normal.  He made reasonable urine 1300 but still positive fluid balance and not clearing- crt rising quickly while holding CRRT- will need to resume today - vascath has been replaced 2. Hemorrhagic shock from acute retroperitoneal bleed: Remains on pressors with limited ability to wean off - stable to improved 3. Acute blood loss anemia secondary to retroperitoneal hemorrhage: Hemoglobin/hematocrit  Down again.  Transfusion of one unit 3/18. Have added aranesp.   4. Elytes- stable  5. VDRF- s/p trach 3/21 -  6. BP/VOL- overall overloaded with significant edema on exam- albumin 2.8.  Making urine but positive fluid balance- to reinitiate CRRT today - keep lasix going  Subjective:   CRRT held from 3/21 due to trach and I wanted to see what UOP/BP would do off of it.  He made over a liter of urine  But BUN and creatinine up again and overall positive fluid balance- also WBC climbing and still o npressors   Objective:   BP 106/61   Pulse 91   Temp 99.5 F (37.5 C) (Axillary)   Resp 20   Ht 5' 6" (1.676 m)   Wt (!) 155.6 kg (343 lb)   SpO2 96%   BMI 55.36 kg/m   Intake/Output Summary (Last 24 hours) at 05/27/16 0639 Last data filed at 05/27/16 0600  Gross per 24 hour  Intake          2949.68 ml  Output             1525 ml  Net          1424.68 ml   Weight change: -1.361 kg (-3 lb)  Physical Exam: Gen:Intubated, sedated, morbidly obese man.  Right IJ vascath replaced 3/23 CVS: Pulse regular rhythm,  Normal rate, S1 and S2 normal Resp: Diminished breath sounds bilaterally-no distinct rales or rhonchi Abd: Soft, obese, nontender Ext: 2-3+ upper and lower extremity edema, scrotal edema noted  Imaging: Dg Chest Port 1 View  Result Date: 05/26/2016 CLINICAL DATA:  Status post dialysis catheter placement EXAM: PORTABLE CHEST 1 VIEW COMPARISON:  05/25/2016 FINDINGS: Right and left jugular central venous catheters in unchanged position. Interval placement of a feeding tube coursing below the diaphragm. Tracheostomy tube in satisfactory position. Bilateral mild interstitial thickening. No pleural effusion or pneumothorax. Stable cardiomegaly. No acute osseous abnormality. IMPRESSION: 1. Support lines and tubing in unchanged position. 2. Nasogastric tube coursing below the diaphragm. 3. Cardiomegaly with mild pulmonary vascular congestion which has improved compared with the prior exam. Electronically Signed   By: Hetal  Patel   On: 05/26/2016 12:09   Dg Abd Portable 1v  Result Date: 05/25/2016 CLINICAL DATA:  Feeding tube placement EXAM: PORTABLE ABDOMEN - 1 VIEW COMPARISON:  None. FINDINGS: Feeding tube coils in the fundus of the stomach with the tip in the mid portion of the stomach. IMPRESSION: Feeding tube coils in the fundus of the stomach. Electronically Signed   By: Kevin  Dover M.D.   On: 05/25/2016 10:45      Labs: BMET  Recent Labs Lab 05/22/16 1600 05/23/16 0416 05/23/16 1515 05/12/2016 0400 05/25/16 0430 05/26/16 0412 05/27/16 0359  NA 135 134* 135 136 136 135 136  K 3.9 3.9 3.9 4.0 3.9 3.6 3.7  CL 101 100* 102 101 103 101 103  CO2 _0 GLUCOSE 125* 125* 128* 130* 121* 141* 152*  BUN _1 30* 51* 82*  CREATININE 1.48* 1.40* 1.36* 1.41* 2.27* 3.02* 3.45*  CALCIUM 8.5* 8.6* 8.5* 8.7* 8.7* 8.8* 8.7*  PHOS 2.9 2.8 2.4* 2.3* 2.7 3.3 3.0   CBC  Recent Labs Lab 05/22/16 0430 05/23/16 0415 05/26/2016 0400 05/25/16 0430 05/26/16 0411 05/27/16 0359  WBC  8.8 8.4 10.2 11.8* 10.6* 20.9*  NEUTROABS 6.9 6.7 8.2* 10.3*  --   --   HGB 8.1* 8.0* 8.2* 7.3* 8.2* 8.2*  HCT 25.1* 25.4* 26.3* 23.2* 24.9* 26.0*  MCV 99.2 100.0 103.5* 103.1* 104.2* 104.0*  PLT 67* 69* 65* 67* 80* 96*    Medications:    . sodium chloride   Intravenous Once  . ceFEPime (MAXIPIME) IV  1 g Intravenous Q24H  . chlorhexidine gluconate (MEDLINE KIT)  15 mL Mouth Rinse BID  . Chlorhexidine Gluconate Cloth  6 each Topical Daily  . darbepoetin (ARANESP) injection - NON-DIALYSIS  150 mcg Subcutaneous Q Mon-1800  . feeding supplement (PRO-STAT SUGAR FREE 64)  30 mL Per Tube QID  . furosemide  160 mg Intravenous Q12H  . hydrocortisone sod succinate (SOLU-CORTEF) inj  50 mg Intravenous Q6H  . insulin aspart  0-9 Units Subcutaneous Q4H  . levothyroxine  125 mcg Per Tube QAC breakfast  . mouth rinse  15 mL Mouth Rinse QID  . pantoprazole sodium  40 mg Per Tube Daily  . polyethylene glycol  17 g Oral Daily   Korrie Hofbauer A  05/27/2016, 6:39 AM

## 2016-05-27 NOTE — Progress Notes (Signed)
PCCM Progress Note  Admission date: 2016-05-25 Referring provider: Dr. Clarene Duke, [facility]  CC: altered mental status  HPI: 71 y/o male from [facility] admitted 3/5 with altered mental status with aspiration, sepsis, hypoxic/hypercapnic respiratory failure. Course c/b retroperitoneal bleed, shock, acute renal failure   has a past medical history of Asthma; Atrial fibrillation (HCC); Gout; Hypertension; Morbid obesity (HCC); Multinodular goiter; Osteoarthrosis and allied disorders; Other and unspecified hyperlipidemia; Stroke [facility]); Thrombocytopenia (HCC); and Vitamin D deficiency.   Antibiotics: Zosyn 3/06 >> 3/08 Rocephin (H.Flu PNA) 3/08 >> 3/14 Vancomycin 3/23 >>  Cefepime 3/23 >>   Cultures: Urine 3/05 >> negative Blood 3/06 >> negative Resp 3/6 >> H flu (b-lactamase +) Trach stoma site 3/23 >> GNR, GPC, GPR >>    Lines/tubes: ETT 3/05 >> 3/8 ETT 3/10 >> 3/21 Trach (Dr Jenne Pane) 3/21 >>  Lt IJ CVL 3/07 >>  R HD catheter  >> 3/22 R IJ HD catheter 3/23 >>   Events: 3/05  Transfer to [facility] Sputum 3/06 >> Haemophilus influenzae (beta lactamase positive) Echo 3/06 >> EF 65 to 70%, mod LVH, mod TR, PAS 39 mmHg CT head 3/7 >> no acute abnormality    SUBJECTIVE/OVERNIGHT/INTERVAL HX Mental status slightly better Urine output has continued to pick up Remains on norepinephrine, Precedex  Vital signs: BP 121/66   Pulse (!) 111   Temp 99 F (37.2 C) (Oral)   Resp (!) 24   Ht 5\' 6"  (1.676 m)   Wt (!) 155.6 kg (343 lb)   SpO2 92%   BMI 55.36 kg/m   Intake/output:  Intake/Output Summary (Last 24 hours) at 05/27/16 0908 Last data filed at 05/27/16 0700  Gross per 24 hour  Intake          2738.45 ml  Output             1660 ml  Net          1078.45 ml   I/O last 3 completed shifts: In: 4158.5 [I.V.:1210.5; Other:20; NG/GT:2180; IV Piggyback:748] Out: 2267 [Urine:2067; Stool:200]  EXAM Gen: Obese man debilitated, some agitation and spontaneous movements  ENT: Tracheostomy in  place, no oral secretions, some purulent material around the stoma  Neck: Unable to assess JVP, ? Evidence stomatitis as above  Lungs: Clear anteriorly some lateral crackles on inspiration  Cardiovascular: Tachycardic, regular, distant  Abdomen: Soft, obese, hypoactive bowel sounds, rectal tube  Musculoskeletal: No deformities  Neuro: Nodded to question today, has had some spontaneous movements opened eyes to voice not able to track effectively  Skin: Significant scrotal edema, pad in place to avoid skin breakdown   LABS  PULMONARY No results for input(s): PHART, PCO2ART, PO2ART, HCO3, TCO2, O2SAT in the last 168 hours.  Invalid input(s): PCO2, PO2  CBC  Recent Labs Lab 05/25/16 0430 05/26/16 0411 05/27/16 0359  HGB 7.3* 8.2* 8.2*  HCT 23.2* 24.9* 26.0*  WBC 11.8* 10.6* 20.9*  PLT 67* 80* 96*    COAGULATION  Recent Labs Lab 05/21/16 0400 05/22/16 0430 05/23/16 0415 05/17/2016 0400 05/25/16 0430  INR 1.36 1.28 1.37 1.35 1.45    CARDIAC   No results for input(s): TROPONINI in the last 168 hours. No results for input(s): PROBNP in the last 168 hours.   CHEMISTRY  Recent Labs Lab 05/23/16 0415  05/23/16 1515 05/09/2016 0400 05/25/16 0430 05/26/16 0411 05/26/16 0412 05/27/16 0359  NA  --   < > 135 136 136  --  135 136  K  --   < >  3.9 4.0 3.9  --  3.6 3.7  CL  --   < > 102 101 103  --  101 103  CO2  --   < > 26 28 27   --  25 25  GLUCOSE  --   < > 128* 130* 121*  --  141* 152*  BUN  --   < > 16 18 30*  --  51* 82*  CREATININE  --   < > 1.36* 1.41* 2.27*  --  3.02* 3.45*  CALCIUM  --   < > 8.5* 8.7* 8.7*  --  8.8* 8.7*  MG 2.3  --   --  2.4 2.4 2.4  --  2.1  PHOS  --   < > 2.4* 2.3* 2.7  --  3.3 3.0  < > = values in this interval not displayed. Estimated Creatinine Clearance: 27.5 mL/min (A) (by C-G formula based on SCr of 3.45 mg/dL (H)).   LIVER  Recent Labs Lab 05/21/16 0400  05/22/16 0430  05/23/16 0415  05/23/16 1515 05/04/2016 0400  05/25/16 0430 05/26/16 0412 05/27/16 0359  AST  --   --  69*  --  56*  --   --   --   --   --   --   ALT  --   --  276*  --  225*  --   --   --   --   --   --   ALKPHOS  --   --  129*  --  121  --   --   --   --   --   --   BILITOT  --   --  6.2*  --  6.2*  --   --   --   --   --   --   PROT  --   --  7.0  --  7.1  --   --   --   --   --   --   ALBUMIN 2.3*  < > 2.7*  2.7*  < > 2.8*  < > 2.8* 2.8* 2.4* 2.5* 2.2*  INR 1.36  --  1.28  --  1.37  --   --  1.35 1.45  --   --   < > = values in this interval not displayed.   INFECTIOUS  Recent Labs Lab 05/26/16 0413  LATICACIDVEN 1.6     ENDOCRINE CBG (last 3)   Recent Labs  05/26/16 2320 05/27/16 0321 05/27/16 0742  GLUCAP 135* 144* 130*     IMAGING x48h  - image(s) personally visualized  -   highlighted in bold Dg Chest Port 1 View  Result Date: 05/26/2016 CLINICAL DATA:  Status post dialysis catheter placement EXAM: PORTABLE CHEST 1 VIEW COMPARISON:  05/25/2016 FINDINGS: Right and left jugular central venous catheters in unchanged position. Interval placement of a feeding tube coursing below the diaphragm. Tracheostomy tube in satisfactory position. Bilateral mild interstitial thickening. No pleural effusion or pneumothorax. Stable cardiomegaly. No acute osseous abnormality. IMPRESSION: 1. Support lines and tubing in unchanged position. 2. Nasogastric tube coursing below the diaphragm. 3. Cardiomegaly with mild pulmonary vascular congestion which has improved compared with the prior exam. Electronically Signed   By: Elige Ko   On: 05/26/2016 12:09   Dg Abd Portable 1v  Result Date: 05/25/2016 CLINICAL DATA:  Feeding tube placement EXAM: PORTABLE ABDOMEN - 1 VIEW COMPARISON:  None. FINDINGS: Feeding tube coils in the fundus of the stomach with  the tip in the mid portion of the stomach. IMPRESSION: Feeding tube coils in the fundus of the stomach. Electronically Signed   By: Charlett Nose M.D.   On: 05/25/2016 10:45     DISCUSSION: 71 year old man, history of fibrillation, obesity, admitted with respiratory failure and septic shock in the setting of H flu pneumonia. Subsequent course complicated by retroperitoneal bleeding on anticoagulation, hemorrhagic shock, acute respiratory failure and acute renal failure. Currently mechanically ventilated, on CVVHD. Pressor needs improving slowly. s/p trach   ASSESSMENT / PLAN:  PULMONARY A: Acute respiratory failure Pneumonia, H influenza (beta lactamase positive) Presumed OSA/OHS History of asthma Bilateral pulm infiltrates / effusions, consistent w volume overload P:   Continue to minimize sedation, work towards spontaneous breathing trials Continue bronchodilators as needed  CARDIOVASCULAR A:  Shock, principally hemorrhagic. Component of volume shifts given continuous dialysis  Paroxysmal atrial fibrillation History of hypertension P:  On norepinephrine, vasopressin has been stopped Likely restart hemodialysis today to facilitate volume removal Follow hemoglobin is below Continue stress dose steroids while requiring pressors No plans to restart anticoagulation at this time. We will need to reassess as he stabilizes.  RENAL A:   Acute renal failure, presumed due to ATN in the setting of hemorrhagic shock P:   Appreciate nephrology assistance His urine output has been good, has continued to improve but his serum creatinine and BUN have risen. These are good signs but he will need to restart hemodialysis at least temporarily. Planning to do this today  GASTROINTESTINAL A:   Large retroperitoneal bleed noted, some slight increased 3/18 CT scan, hemoglobin currently stable Shock liver, transaminitis, improving Hepatic lesion; noted on CT scan of the abdomen 3/18. 5 cm lesion of the dome of the liver, could represent blood or infection or infarct.  Nutrition P:   Follow CBC Goal hemoglobin greater than 7 Continue PPI Following lesion dome of  liver. May need to re-image, consider empiric rx abx especially if he is unable to come off pressors.  Continue to feeding Continue rectal tube for now, assess daily for our ability to hopefully remove. Scrotal edema and skin breakdown may preclude this  HEMATOLOGIC A:   Acute blood loss anemia, improved Severe heparin sensitivity, associated with hemorrhage P:  No more heparin, on allergy list Continue to hold anticoagulation as outlined above SCD for DVT prophylaxis  INFECTIOUS A:   H. influenzae pneumonia, treated Septic shock ? Clinical significance hepatic lesion, ? Possible abscess vs blood Possible stomatitis P:   Antibiotics for pneumonia were completed Restarted vancomycin and, cefepime on 3/23 given concern for stomatitis Follow culture data  ENDOCRINE A:   Hyperglycemia, on steroids Hypothyroidism P:   Continue SSI, CBG Continue Synthroid  NEUROLOGIC A:   Toxic metabolic encephalopathy History of CVA with residual deficits P:   RASS goal: -1 to 0 Continue Precedex, minimize narcotics as able. His mental status does appear to be improving Consider repeat brain imaging if his mental status does not clear completely   FAMILY DISCUSSIONS:  Full code. Family very upset at outcome, bleeding - see 05/14/16 and 05/15/16 High mortality situation and defnitely high morbidity situation  On 05/17/16 - daughter at bedside: very upset that Iv Heparin gtt was given. Says that in 2013 for A Fib stroke admit got IV heparin gtt and bled around heart. Upset to heart hat this is not in record because Rx was [facility]. She says other anticoagulants including coumadin fine but not IV heparing ttt. [facility] to staff that patient should never get IV heparin gttt (she did this a priori per her). Upset that we did not take this into account and list in allergy list and still gave IV heparin gtt. REports that staff used words like "Trial and  Error".   Updated by RN by phone 3/16-3/20  05/18/2016 - Daughter, Granddaughter and extended family updated by Dr Delton CoombesByrum. Current issues and plans outlined. All questions answered. They are hopeful that resp failure and hemodynamics are reversible problems, and I agree that this may be possible. I have cautioned that I am less sure that he will have a renal recovery. Will continue to update regularly.   - Inter-disciplinary family meet or Palliative Care meeting due by:  Ongoing   CODE STATUS Full Code  DISPO Keep in ICU    Independent CC time 34 minutes  Levy Pupaobert Neema Fluegge, MD, PhD 05/27/2016, 9:08 AM [facility] [phone] or if no answer [phone]

## 2016-05-28 LAB — GLUCOSE, CAPILLARY
GLUCOSE-CAPILLARY: 118 mg/dL — AB (ref 65–99)
GLUCOSE-CAPILLARY: 137 mg/dL — AB (ref 65–99)
GLUCOSE-CAPILLARY: 146 mg/dL — AB (ref 65–99)
Glucose-Capillary: 135 mg/dL — ABNORMAL HIGH (ref 65–99)
Glucose-Capillary: 116 mg/dL — ABNORMAL HIGH (ref 65–99)
Glucose-Capillary: 136 mg/dL — ABNORMAL HIGH (ref 65–99)

## 2016-05-28 LAB — RENAL FUNCTION PANEL
ALBUMIN: 2.3 g/dL — AB (ref 3.5–5.0)
ALBUMIN: 2.4 g/dL — AB (ref 3.5–5.0)
ANION GAP: 9 (ref 5–15)
Anion gap: 11 (ref 5–15)
BUN: 83 mg/dL — ABNORMAL HIGH (ref 6–20)
BUN: 66 mg/dL — ABNORMAL HIGH (ref 6–20)
CALCIUM: 8.4 mg/dL — AB (ref 8.9–10.3)
CHLORIDE: 99 mmol/L — AB (ref 101–111)
CO2: 25 mmol/L (ref 22–32)
CO2: 27 mmol/L (ref 22–32)
Calcium: 8.4 mg/dL — ABNORMAL LOW (ref 8.9–10.3)
Chloride: 100 mmol/L — ABNORMAL LOW (ref 101–111)
Creatinine, Ser: 2.51 mg/dL — ABNORMAL HIGH (ref 0.61–1.24)
Creatinine, Ser: 1.94 mg/dL — ABNORMAL HIGH (ref 0.61–1.24)
GFR calc Af Amer: 28 mL/min — ABNORMAL LOW (ref >60)
GFR calc Af Amer: 38 mL/min — ABNORMAL LOW (ref >60)
GFR calc non Af Amer: 24 mL/min — ABNORMAL LOW (ref >60)
GFR calc non Af Amer: 33 mL/min — ABNORMAL LOW (ref >60)
GLUCOSE: 156 mg/dL — AB (ref 65–99)
Glucose, Bld: 151 mg/dL — ABNORMAL HIGH (ref 65–99)
PHOSPHORUS: 2.5 mg/dL (ref 2.5–4.6)
PHOSPHORUS: 3.1 mg/dL (ref 2.5–4.6)
POTASSIUM: 3.5 mmol/L (ref 3.5–5.1)
Potassium: 3.4 mmol/L — ABNORMAL LOW (ref 3.5–5.1)
SODIUM: 136 mmol/L (ref 135–145)
Sodium: 135 mmol/L (ref 135–145)

## 2016-05-28 LAB — CBC
HCT: 26.8 % — ABNORMAL LOW (ref 39.0–52.0)
HEMOGLOBIN: 8.5 g/dL — AB (ref 13.0–17.0)
MCH: 32.6 pg (ref 26.0–34.0)
MCHC: 31.7 g/dL (ref 30.0–36.0)
MCV: 102.7 fL — ABNORMAL HIGH (ref 78.0–100.0)
Platelets: 72 10*3/uL — ABNORMAL LOW (ref 150–400)
RBC: 2.61 MIL/uL — AB (ref 4.22–5.81)
WBC: 14.9 10*3/uL — ABNORMAL HIGH (ref 4.0–10.5)

## 2016-05-28 LAB — CULTURE, RESPIRATORY

## 2016-05-28 LAB — MAGNESIUM: Magnesium: 1.9 mg/dL (ref 1.7–2.4)

## 2016-05-28 MED ORDER — ALTEPLASE 2 MG IJ SOLR
2.0000 mg | Freq: Once | INTRAMUSCULAR | Status: AC
  Administered 2016-05-28: 1.2 mg
  Filled 2016-05-28: qty 2

## 2016-05-28 MED ORDER — SODIUM PHOSPHATES 45 MMOLE/15ML IV SOLN
20.0000 mmol | Freq: Once | INTRAVENOUS | Status: AC
  Administered 2016-05-28: 20 mmol via INTRAVENOUS
  Filled 2016-05-28: qty 6.67

## 2016-05-28 NOTE — Progress Notes (Signed)
CRRT stopped due to high access pressures and catheter clotting. Initial high pressure alarms began approximately 23:30; when assessing catheter ports I first disconnected red access port, sluggish blood return was noted, and then flushed; upon disconnecting blue return port a large clot was pulled from port, return port flushed, but was unable to get blood return. Unable to return blood from filter due to clotting. Additional filter attempted, however, immediately high access pressures alarmed, blood returned, and IV consult for tPa catheter ports ordered.

## 2016-05-28 NOTE — Progress Notes (Signed)
PCCM Progress Note  Admission date: 2016/06/02 Referring provider: Dr. Clarene Duke, [facility] ER  CC: altered mental status  HPI: 71 y/o male from [facility] admitted 3/5 with altered mental status with aspiration, sepsis, hypoxic/hypercapnic respiratory failure. Course c/b retroperitoneal bleed, shock, acute renal failure   has a past medical history of Asthma; Atrial fibrillation (HCC); Gout; Hypertension; Morbid obesity (HCC); Multinodular goiter; Osteoarthrosis and allied disorders; Other and unspecified hyperlipidemia; Stroke [facility]); Thrombocytopenia (HCC); and Vitamin D deficiency.   Antibiotics: Zosyn 3/06 >> 3/08 Rocephin (H.Flu PNA) 3/08 >> 3/14 Vancomycin 3/23 >>  Cefepime 3/23 >>   Cultures: Urine 3/05 >> negative Blood 3/06 >> negative Resp 3/6 >> H flu (b-lactamase +) Trach stoma site 3/23 >> GNR, GPC, GPR >>    Lines/tubes: ETT 3/05 >> 3/8 ETT 3/10 >> 3/21 Trach (Dr Jenne Pane) 3/21 >>  Lt IJ CVL 3/07 >>  R HD catheter  >> 3/22 R IJ HD catheter 3/23 >>   Events: 3/05  Transfer to [facility] Sputum 3/06 >> Haemophilus influenzae (beta lactamase positive) Echo 3/06 >> EF 65 to 70%, mod LVH, mod TR, PAS 39 mmHg CT head 3/7 >> no acute abnormality    SUBJECTIVE/OVERNIGHT/INTERVAL HX Mental status continues to improve!! Levothroid dose currently 6  Vital signs: BP 118/77   Pulse 84   Temp 97.3 F (36.3 C) (Oral)   Resp 14   Ht 5\' 6"  (1.676 m)   Wt (!) 157.9 kg (348 lb)   SpO2 97%   BMI 56.17 kg/m   Intake/output:  Intake/Output Summary (Last 24 hours) at 05/28/16 0830 Last data filed at 05/28/16 0700  Gross per 24 hour  Intake          3111.53 ml  Output             6464 ml  Net         -3352.47 ml   I/O last 3 completed shifts: In: 4378.7 [I.V.:1410.7; Other:150; NG/GT:2020; IV Piggyback:798] Out: 7599 [Urine:4360; Other:3239]  EXAM Gen: Obese man debilitated, More calm and more awake  ENT: Tracheostomy in place, minimal oral secretions, less exudate around his  stoma  Neck: Unable to assess JVP, trach site looks cleaner, less odor  Lungs: Difficult exam, clear anteriorly  Cardiovascular: Regular, distant  Abdomen: Soft, obese, normal bowel sounds, rectal tube in place  Musculoskeletal: No deformity  Neuro: He is much more awake today. He turned his head to voice, nodded to question, tried to mouth words to answer my question, followed commands. He is moving his left upper extremity but not his right  Skin: Continues to have significant lower extremity and scrotal edema, pad in place to avoid skin breakdown   LABS  PULMONARY No results for input(s): PHART, PCO2ART, PO2ART, HCO3, TCO2, O2SAT in the last 168 hours.  Invalid input(s): PCO2, PO2  CBC  Recent Labs Lab 05/26/16 0411 05/27/16 0359 05/28/16 0409  HGB 8.2* 8.2* 8.5*  HCT 24.9* 26.0* 26.8*  WBC 10.6* 20.9* 14.9*  PLT 80* 96* 72*    COAGULATION  Recent Labs Lab 05/22/16 0430 05/23/16 0415 06/01/2016 0400 05/25/16 0430  INR 1.28 1.37 1.35 1.45    CARDIAC   No results for input(s): TROPONINI in the last 168 hours. No results for input(s): PROBNP in the last 168 hours.   CHEMISTRY  Recent Labs Lab 05/11/2016 0400 05/25/16 0430 05/26/16 0411 05/26/16 0412 05/27/16 0359 05/27/16 1653 05/28/16 0409  NA 136 136  --  135 136 135 135  K 4.0 3.9  --  3.6 3.7 3.7 3.5  CL 101 103  --  101 103 102 99*  CO2 28 27  --  25 25 25 25   GLUCOSE 130* 121*  --  141* 152* 151* 156*  BUN 18 30*  --  51* 82* 80* 83*  CREATININE 1.41* 2.27*  --  3.02* 3.45* 2.87* 2.51*  CALCIUM 8.7* 8.7*  --  8.8* 8.7* 8.5* 8.4*  MG 2.4 2.4 2.4  --  2.1  --  1.9  PHOS 2.3* 2.7  --  3.3 3.0 2.5 2.5   Estimated Creatinine Clearance: 38.2 mL/min (A) (by C-G formula based on SCr of 2.51 mg/dL (H)).   LIVER  Recent Labs Lab 05/22/16 0430  05/23/16 0415  05/08/2016 0400 05/25/16 0430 05/26/16 0412 05/27/16 0359 05/27/16 1653 05/28/16 0409  AST 69*  --  56*  --   --   --   --   --    --   --   ALT 276*  --  225*  --   --   --   --   --   --   --   ALKPHOS 129*  --  121  --   --   --   --   --   --   --   BILITOT 6.2*  --  6.2*  --   --   --   --   --   --   --   PROT 7.0  --  7.1  --   --   --   --   --   --   --   ALBUMIN 2.7*  2.7*  < > 2.8*  < > 2.8* 2.4* 2.5* 2.2* 2.5* 2.3*  INR 1.28  --  1.37  --  1.35 1.45  --   --   --   --   < > = values in this interval not displayed.   INFECTIOUS  Recent Labs Lab 05/26/16 0413  LATICACIDVEN 1.6     ENDOCRINE CBG (last 3)   Recent Labs  05/27/16 2332 05/28/16 0415 05/28/16 0739  GLUCAP 140* 135* 146*     IMAGING x48h  - image(s) personally visualized  -   highlighted in bold Dg Chest Port 1 View  Result Date: 05/26/2016 CLINICAL DATA:  Status post dialysis catheter placement EXAM: PORTABLE CHEST 1 VIEW COMPARISON:  05/25/2016 FINDINGS: Right and left jugular central venous catheters in unchanged position. Interval placement of a feeding tube coursing below the diaphragm. Tracheostomy tube in satisfactory position. Bilateral mild interstitial thickening. No pleural effusion or pneumothorax. Stable cardiomegaly. No acute osseous abnormality. IMPRESSION: 1. Support lines and tubing in unchanged position. 2. Nasogastric tube coursing below the diaphragm. 3. Cardiomegaly with mild pulmonary vascular congestion which has improved compared with the prior exam. Electronically Signed   By: Elige Ko   On: 05/26/2016 12:09    DISCUSSION: 71 year old man, history of fibrillation, obesity, admitted with respiratory failure and septic shock in the setting of H flu pneumonia. Subsequent course complicated by retroperitoneal bleeding on anticoagulation, hemorrhagic shock, acute respiratory failure and acute renal failure. Currently mechanically ventilated, on CVVHD. Pressor needs improving slowly. s/p trach   ASSESSMENT / PLAN:  PULMONARY A: Acute respiratory failure Pneumonia, H influenza (beta lactamase  positive) Presumed OSA/OHS History of asthma Bilateral pulm infiltrates / effusions, consistent w volume overload P:   Continue to use Precedex, minimize sedation, he is likely ready for pressure support Continue bronchodilators when necessary Volume removal  as per renal section, he is now net negative  CARDIOVASCULAR A:  Shock, principally hemorrhagic. Component of volume shifts given continuous dialysis  Paroxysmal atrial fibrillation History of hypertension P:  Continue to wean norepinephrine, goal to off soon Volume removal Hemoglobin has been stable for several days since his retroperitoneal bleed Continue stress dose steroids while he is requiring pressors No plan to restart anticoagulation at this time. This will ultimately need to be reassessed and restarted  RENAL A:   Acute renal failure, presumed due to ATN in the setting of hemorrhagic shock P:   Appreciate nephrology assistance Started back on CVVHD on 3/24. His urine output was actually good during dialysis holiday. I believe this is a good sign that he will probably achieve renal recovery.  Volume removal, he is now net negative. Continue volume removal as his blood pressure will tolerate to help with ventilator weaning  GASTROINTESTINAL A:   Large retroperitoneal bleed noted, some slight increased 3/18 CT scan, hemoglobin currently stable Shock liver, transaminitis, improving Hepatic lesion; noted on CT scan of the abdomen 3/18. 5 cm lesion of the dome of the liver, could represent blood or infection or infarct.  Nutrition P:   Follow CBC, hemoglobin has been stable for several days since his life-threatening retroperitoneal bleed Goal hemoglobin greater than 7.0 Continue PPI Following lesion noted on CT scan of the abdomen on the dome of his liver. May need to reimage in order to determine whether this needs to be drained or just followed. Consider a possible infectious focus given his continued pressor needs   Continue tube feeds Assess daily for ability to remove rectal tube. His scrotal edema and skin breakdown may preclude this currently  HEMATOLOGIC A:   Acute blood loss anemia, improved Severe heparin sensitivity, associated with hemorrhage P:  No more heparin, significant sensitivity to this, it is always allergy this Continued hold anticoagulation as outlined above SCD for DVT prophylaxis  INFECTIOUS A:   H. influenzae pneumonia, treated Septic shock ? Clinical significance hepatic lesion, ? Possible abscess vs blood Possible stomatitis P:   Antibiotics for pneumonia were completed Continue vancomycin and cefepime, started 3/23, given concern for stomatitis Follow culture data  ENDOCRINE A:   Hyperglycemia, on steroids Hypothyroidism P:   Continue SSI, CBG Continue Synthroid  NEUROLOGIC A:   Toxic metabolic encephalopathy, starting to clear his mental status History of CVA with residual deficits P:   RASS goal: 0 Decrease Precedex as we are able. His mental status is significantly improving Defer repeat brain imaging for now given his improvement   FAMILY DISCUSSIONS:  Full code. Family very upset at outcome, bleeding - see 05/14/16 and 05/15/16 High mortality situation and defnitely high morbidity situation  On 05/17/16 - daughter at bedside: very upset that Iv Heparin gtt was given. Says that in 2013 for A Fib stroke admit got IV heparin gtt and bled around heart. Upset to heart hat this is not in record because [facility] was [facility]. She says other anticoagulants including coumadin fine but not IV heparing ttt. Reports that she informed both at [facility] and [facility] to staff that patient should never get IV heparin gttt (she did this a priori per her). Upset that we did not take this into account and list in allergy list and still gave IV heparin gtt. REports that staff used words like "Trial and Error".   05/23/2016 - Daughter, Granddaughter and extended family updated by  Dr Delton Coombes. Current issues and plans  outlined. All questions answered. They are hopeful that resp failure and hemodynamics are reversible problems, and I agree that this may be possible. I have cautioned that I am less sure that he will have a renal recovery. Will continue to update regularly.   3/24 -- updated patient's daughter at bedside on 3/24  - Inter-disciplinary family meet or Palliative Care meeting due by:  Ongoing   CODE STATUS Full Code  DISPO Keep in ICU   Independent critical care time 33 minutes  Levy Pupaobert Byrum, MD, PhD 05/28/2016, 8:30 AM [facility] [phone] or if no answer [phone]

## 2016-05-28 NOTE — Progress Notes (Signed)
Patient ID: Omar [name], male   DOB: 08-Jan-1946, 71 y.o.   MRN: [id number]  [facility] Progress Note   Assessment/ Plan:   1. Acute kidney injury: Likely ATN from hemorrhagic shock. He was anuric - on CRRT from 3/12-  3/21, then resumed 3/23.  Significantly volume overloaded on physical exam but also hypoalbuminemic likely third spacing. CRRT stopped when more hypotensive and FI02 of 40 as well as CVP of 10 but still with edema up to the abdominal wall.  Baseline kidney function relatively normal.  He made good urine 3000 but still positive fluid balance and not clearing- crt rising quickly while holding CRRT-  resumed yesterday - vascath had been replaced- numbers unchanged just because didn't get great HD overnight 2. Hemorrhagic shock from acute retroperitoneal bleed: Remains on pressors with limited ability to wean off - stable  3. Acute blood loss anemia secondary to retroperitoneal hemorrhage: Hemoglobin/hematocrit  stable but down.  Transfusion of one unit 3/18. Have added aranesp.   4. Elytes- stable  5. VDRF- s/p trach 3/21 -  6. BP/VOL- overall overloaded with significant edema on exam- albumin 2.8.  Making urine but positive fluid balance - keep lasix going but also CRRT   Subjective:   Some technical difficulties with CRRT (sounded like catheter)  Got TPA and is running this AM   Objective:   BP 93/61   Pulse 79   Temp 98.1 F (36.7 C) (Oral)   Resp (!) 24   Ht _0  (1.676 m)   Wt (!) 157.9 kg (348 lb)   SpO2 97%   BMI 56.17 kg/m   Intake/Output Summary (Last 24 hours) at 05/28/16 0700 Last data filed at 05/28/16 0600  Gross per 24 hour  Intake          3109.53 ml  Output             6212 ml  Net         -3102.47 ml   Weight change: 2.268 kg (5 lb)  Physical Exam: NIO:[id number], sedated, morbidly obese man.  Right IJ vascath replaced 3/23 CVS: Pulse regular rhythm, Normal rate, S1 and S2 normal Resp: Diminished breath sounds bilaterally-no  distinct rales or rhonchi Abd: Soft, obese, nontender Ext: 2-3+ upper and lower extremity edema, scrotal edema noted  Imaging: Dg Chest Port 1 View  Result Date: 05/26/2016 CLINICAL DATA:  Status post dialysis catheter placement EXAM: PORTABLE CHEST 1 VIEW COMPARISON:  05/25/2016 FINDINGS: Right and left jugular central venous catheters in unchanged position. Interval placement of a feeding tube coursing below the diaphragm. Tracheostomy tube in satisfactory position. Bilateral mild interstitial thickening. No pleural effusion or pneumothorax. Stable cardiomegaly. No acute osseous abnormality. IMPRESSION: 1. Support lines and tubing in unchanged position. 2. Nasogastric tube coursing below the diaphragm. 3. Cardiomegaly with mild pulmonary vascular congestion which has improved compared with the prior exam. Electronically Signed   By: Kathreen Devoid   On: 05/26/2016 12:09    Labs: BMET  Recent Labs Lab 05/23/16 1515 05/11/2016 0400 05/25/16 0430 05/26/16 0412 05/27/16 0359 05/27/16 1653 05/28/16 0409  NA 135 136 136 135 136 135 135  K 3.9 4.0 3.9 3.6 3.7 3.7 3.5  CL 102 101 103 101 103 102 99*  CO2 _1 GLUCOSE 128* 130* 121* 141* 152* 151* 156*  BUN 16 18 30* 51* 82* 80* 83*  CREATININE 1.36* 1.41* 2.27* 3.02* 3.45* 2.87* 2.51*  CALCIUM 8.5* 8.7* 8.7*  8.8* 8.7* 8.5* 8.4*  PHOS 2.4* 2.3* 2.7 3.3 3.0 2.5 2.5   CBC  Recent Labs Lab 05/22/16 0430 05/23/16 0415 05/05/2016 0400 05/25/16 0430 05/26/16 0411 05/27/16 0359 05/28/16 0409  WBC 8.8 8.4 10.2 11.8* 10.6* 20.9* 14.9*  NEUTROABS 6.9 6.7 8.2* 10.3*  --   --   --   HGB 8.1* 8.0* 8.2* 7.3* 8.2* 8.2* 8.5*  HCT 25.1* 25.4* 26.3* 23.2* 24.9* 26.0* 26.8*  MCV 99.2 100.0 103.5* 103.1* 104.2* 104.0* 102.7*  PLT 67* 69* 65* 67* 80* 96* 72*    Medications:    . sodium chloride   Intravenous Once  . ceFEPime (MAXIPIME) IV  2 g Intravenous Q12H  . chlorhexidine gluconate (MEDLINE KIT)  15 mL Mouth Rinse BID  .  Chlorhexidine Gluconate Cloth  6 each Topical Daily  . darbepoetin (ARANESP) injection - NON-DIALYSIS  150 mcg Subcutaneous Q Mon-1800  . feeding supplement (PRO-STAT SUGAR FREE 64)  30 mL Per Tube QID  . furosemide  160 mg Intravenous Q12H  . hydrocortisone sod succinate (SOLU-CORTEF) inj  50 mg Intravenous Q6H  . insulin aspart  0-9 Units Subcutaneous Q4H  . levothyroxine  125 mcg Per Tube QAC breakfast  . mouth rinse  15 mL Mouth Rinse QID  . pantoprazole sodium  40 mg Per Tube Daily  . polyethylene glycol  17 g Oral Daily  . vancomycin  1,500 mg Intravenous Q24H   Lillianna Sabel A  05/28/2016, 7:00 AM

## 2016-05-29 ENCOUNTER — Inpatient Hospital Stay (HOSPITAL_COMMUNITY): Payer: Medicare Other

## 2016-05-29 DIAGNOSIS — Z930 Tracheostomy status: Secondary | ICD-10-CM

## 2016-05-29 LAB — CBC
HCT: 28 % — ABNORMAL LOW (ref 39.0–52.0)
Hemoglobin: 8.9 g/dL — ABNORMAL LOW (ref 13.0–17.0)
MCH: 33 pg (ref 26.0–34.0)
MCHC: 31.8 g/dL (ref 30.0–36.0)
MCV: 103.7 fL — AB (ref 78.0–100.0)
PLATELETS: 64 10*3/uL — AB (ref 150–400)
RBC: 2.7 MIL/uL — ABNORMAL LOW (ref 4.22–5.81)
WBC: 14.9 10*3/uL — AB (ref 4.0–10.5)

## 2016-05-29 LAB — RENAL FUNCTION PANEL
ALBUMIN: 2.4 g/dL — AB (ref 3.5–5.0)
ALBUMIN: 2.4 g/dL — AB (ref 3.5–5.0)
Anion gap: 8 (ref 5–15)
Anion gap: 9 (ref 5–15)
BUN: 60 mg/dL — AB (ref 6–20)
BUN: 54 mg/dL — AB (ref 6–20)
CHLORIDE: 101 mmol/L (ref 101–111)
CHLORIDE: 101 mmol/L (ref 101–111)
CO2: 28 mmol/L (ref 22–32)
CO2: 28 mmol/L (ref 22–32)
CREATININE: 1.61 mg/dL — AB (ref 0.61–1.24)
CREATININE: 1.44 mg/dL — AB (ref 0.61–1.24)
Calcium: 8.4 mg/dL — ABNORMAL LOW (ref 8.9–10.3)
Calcium: 8.4 mg/dL — ABNORMAL LOW (ref 8.9–10.3)
GFR calc Af Amer: 48 mL/min — ABNORMAL LOW (ref >60)
GFR calc Af Amer: 55 mL/min — ABNORMAL LOW (ref >60)
GFR calc non Af Amer: 47 mL/min — ABNORMAL LOW (ref >60)
GFR, EST NON AFRICAN AMERICAN: 41 mL/min — AB (ref >60)
GLUCOSE: 172 mg/dL — AB (ref 65–99)
GLUCOSE: 118 mg/dL — AB (ref 65–99)
POTASSIUM: 3.6 mmol/L (ref 3.5–5.1)
Phosphorus: 2.8 mg/dL (ref 2.5–4.6)
Phosphorus: 2.6 mg/dL (ref 2.5–4.6)
Potassium: 3.4 mmol/L — ABNORMAL LOW (ref 3.5–5.1)
Sodium: 137 mmol/L (ref 135–145)
Sodium: 138 mmol/L (ref 135–145)

## 2016-05-29 LAB — GLUCOSE, CAPILLARY
Glucose-Capillary: 162 mg/dL — ABNORMAL HIGH (ref 65–99)
Glucose-Capillary: 130 mg/dL — ABNORMAL HIGH (ref 65–99)
Glucose-Capillary: 101 mg/dL — ABNORMAL HIGH (ref 65–99)
Glucose-Capillary: 115 mg/dL — ABNORMAL HIGH (ref 65–99)
Glucose-Capillary: 93 mg/dL (ref 65–99)
Glucose-Capillary: 82 mg/dL (ref 65–99)

## 2016-05-29 LAB — MAGNESIUM: MAGNESIUM: 2 mg/dL (ref 1.7–2.4)

## 2016-05-29 MED ORDER — POTASSIUM CHLORIDE 20 MEQ/15ML (10%) PO SOLN
40.0000 meq | Freq: Once | ORAL | Status: AC
  Administered 2016-05-29: 40 meq
  Filled 2016-05-29: qty 30

## 2016-05-29 NOTE — Progress Notes (Signed)
RT placed patient on 40% ATC. No complications. Vital signs stable at this time. Patient is tolerating well. RN aware. RT will continue to monitor.

## 2016-05-29 NOTE — Telephone Encounter (Signed)
I have been following his condition when I get sent information and periodically when I have specifically looked at his record to see how he is doing

## 2016-05-29 NOTE — Telephone Encounter (Signed)
Family aware  

## 2016-05-29 NOTE — Progress Notes (Signed)
PCCM Progress Note  Admission date: 05/10/2016 Referring provider: Dr. Clarene Duke, [facility] ER  CC: altered mental status  HPI: 71 y/o male from [facility] admitted 3/5 with altered mental status with aspiration, sepsis, hypoxic/hypercapnic respiratory failure. Course c/b retroperitoneal bleed, shock, acute renal failure & prolonged ventilation requiring tracheostomy    has a past medical history of Asthma; Atrial fibrillation (HCC); Gout; Hypertension; Morbid obesity (HCC); Multinodular goiter; Osteoarthrosis and allied disorders; Other and unspecified hyperlipidemia; Stroke [facility]); Thrombocytopenia (HCC); and Vitamin D deficiency.   Antibiotics: Zosyn 3/06 >> 3/08 Rocephin (H.Flu PNA) 3/08 >> 3/14 Vancomycin 3/23 >>  Cefepime 3/23 >>   Cultures: Urine 3/05 >> negative Blood 3/06 >> negative Resp 3/6 >> H flu (b-lactamase +) Trach stoma site 3/23 >> diphtheroids   Lines/tubes: ETT 3/05 >> 3/8 ETT 3/10 >> 3/21 Trach (Dr Jenne Pane) 3/21 >>  Lt IJ CVL 3/07 >> 3/26 R HD catheter  >> 3/22 R IJ HD catheter 3/23 >>   Events: 3/05  Transfer to [facility] Dt Sputum 3/06 >> Haemophilus influenzae (beta lactamase positive) Echo 3/06 >> EF 65 to 70%, mod LVH, mod TR, PAS 39 mmHg CT head 3/7 >> no acute abnormality    SUBJECTIVE/OVERNIGHT/INTERVAL HX Mental status much improve remains critically il, on pressors & CRRT, vent c/o RUE pain  Vital signs: BP 95/62   Pulse 69   Temp (!) 96.8 F (36 C) (Oral)   Resp 18   Ht 5\' 6"  (1.676 m)   Wt (!) 339 lb 15.2 oz (154.2 kg)   SpO2 100%   BMI 54.87 kg/m   Intake/output:  Intake/Output Summary (Last 24 hours) at 05/29/16 0921 Last data filed at 05/29/16 0700  Gross per 24 hour  Intake          2954.42 ml  Output             5875 ml  Net         -2920.58 ml   I/O last 3 completed shifts: In: 4643.1 [I.V.:1395.1; Other:150; NG/GT:1970; IV Piggyback:1128] Out: 9307 [Urine:4860; ZOXWR:6045; Stool:200]  EXAM Gen: Obese man debilitated, More calm and  more awake  ENT: Tracheostomy in place, mouths words  Neck: no JVD, trach site looks cleaner, less odor  Lungs: Difficult exam, clear anteriorly  Cardiovascular: Regular, distant  Abdomen: Soft, obese, normal bowel sounds, rectal tube in place  Musculoskeletal: No deformity  Neuro:follows commands, mobility limited RUE  Skin: Continues to have significant lower extremity and scrotal edema, pad in place to avoid skin breakdown   LABS  PULMONARY No results for input(s): PHART, PCO2ART, PO2ART, HCO3, TCO2, O2SAT in the last 168 hours.  Invalid input(s): PCO2, PO2  CBC  Recent Labs Lab 05/27/16 0359 05/28/16 0409 05/29/16 0432  HGB 8.2* 8.5* 8.9*  HCT 26.0* 26.8* 28.0*  WBC 20.9* 14.9* 14.9*  PLT 96* 72* 64*    COAGULATION  Recent Labs Lab 05/23/16 0415 May 29, 2016 0400 05/25/16 0430  INR 1.37 1.35 1.45    CARDIAC   No results for input(s): TROPONINI in the last 168 hours. No results for input(s): PROBNP in the last 168 hours.   CHEMISTRY  Recent Labs Lab 05/25/16 0430 05/26/16 0411  05/27/16 0359 05/27/16 1653 05/28/16 0409 05/28/16 1701 05/29/16 0432  NA 136  --   < > 136 135 135 136 137  K 3.9  --   < > 3.7 3.7 3.5 3.4* 3.4*  CL 103  --   < > 103 102 99* 100* 101  CO2 27  --   < >  25 25 25 27 28   GLUCOSE 121*  --   < > 152* 151* 156* 151* 172*  BUN 30*  --   < > 82* 80* 83* 66* 60*  CREATININE 2.27*  --   < > 3.45* 2.87* 2.51* 1.94* 1.61*  CALCIUM 8.7*  --   < > 8.7* 8.5* 8.4* 8.4* 8.4*  MG 2.4 2.4  --  2.1  --  1.9  --  2.0  PHOS 2.7  --   < > 3.0 2.5 2.5 3.1 2.8  < > = values in this interval not displayed. Estimated Creatinine Clearance: 58.7 mL/min (A) (by C-G formula based on SCr of 1.61 mg/dL (H)).   LIVER  Recent Labs Lab 05/23/16 0415  05/31/2016 0400 05/25/16 0430  05/27/16 0359 05/27/16 1653 05/28/16 0409 05/28/16 1701 05/29/16 0432  AST 56*  --   --   --   --   --   --   --   --   --   ALT 225*  --   --   --   --   --    --   --   --   --   ALKPHOS 121  --   --   --   --   --   --   --   --   --   BILITOT 6.2*  --   --   --   --   --   --   --   --   --   PROT 7.1  --   --   --   --   --   --   --   --   --   ALBUMIN 2.8*  < > 2.8* 2.4*  < > 2.2* 2.5* 2.3* 2.4* 2.4*  INR 1.37  --  1.35 1.45  --   --   --   --   --   --   < > = values in this interval not displayed.   INFECTIOUS  Recent Labs Lab 05/26/16 0413  LATICACIDVEN 1.6     ENDOCRINE CBG (last 3)   Recent Labs  05/28/16 2330 05/29/16 0436 05/29/16 0824  GLUCAP 137* 162* 130*     IMAGING x48h  - image(s) personally visualized  -   highlighted in bold Dg Chest Port 1 View  Result Date: 05/29/2016 CLINICAL DATA:  Respiratory failure.  Shortness of breath. EXAM: PORTABLE CHEST 1 VIEW COMPARISON:  05/26/2016 . FINDINGS: Tracheostomy tube, feeding tube, bilateral IJ lines in stable position. Cardiomegaly with diffuse bilateral pulmonary infiltrates consistent pulmonary edema. Bilateral pleural effusions. Surgical clips right neck. IMPRESSION: 1. Lines and tubes in stable position. 2. Cardiomegaly with diffuse bilateral pulmonary infiltrates and bilateral pleural effusions consistent with CHF. Electronically Signed   By: Maisie Fushomas  Register   On: 05/29/2016 06:43    DISCUSSION: 71 year old man, history of fibrillation, obesity, admitted with respiratory failure and septic shock in the setting of H flu pneumonia. Subsequent course complicated by retroperitoneal bleeding on anticoagulation, hemorrhagic shock, acute respiratory failure and acute renal failure. Currently mechanically ventilated, on CVVHD. Pressor needs improving slowly. s/p trach   ASSESSMENT / PLAN:  PULMONARY A: Acute respiratory failure Pneumonia, H influenza (beta lactamase positive) Presumed OSA/OHS History of asthma Bilateral pulm infiltrates / effusions, consistent w volume overload P:   PS wean with goal ATC daytime Continue bronchodilators when necessary Speech  eval for PMV eventual  CARDIOVASCULAR A:  Shock, principally hemorrhagic.  Paroxysmal atrial  fibrillation History of hypertension P:  Continue to wean norepinephrine, goal to off soon Continue stress dose steroids while on pressors No plan to restart anticoagulation at this time.   RENAL A:   Acute renal failure, presumed due to ATN in the setting of hemorrhagic shock P:   Appreciate nephrology assistance Started back on CVVHD on 3/24. His urine output  Much improved  Goal neg 150/h  GASTROINTESTINAL A:   Large retroperitoneal bleed noted, some slight increased 3/18 CT scan, hemoglobin currently stable Shock liver, transaminitis, improving Hepatic lesion; noted on CT scan of the abdomen 3/18. 5 cm lesion of the dome of the liver, could represent blood or infection or infarct.  Protein calorie malnutrition P:   Continue PPI Following lesion noted on CT scan of the abdomen on the dome of his liver. May need to reimage in order to determine whether this needs to be drained or just followed. Consider a possible infectious focus given his continued pressor needs  Continue tube feeds dc rectal tube soon. His scrotal edema and skin breakdown may preclude this currently  HEMATOLOGIC A:   Acute blood loss anemia, improved Severe heparin sensitivity, associated with hemorrhage Thrombocytopenia, acute on chronic -came in low at 140k P:  Follow CBC, Goal hemoglobin greater than 7.0 No heparin Continued hold anticoagulation as outlined above SCD for DVT prophylaxis  INFECTIOUS A:   H. influenzae pneumonia, treated Septic shock ? Clinical significance hepatic lesion, ? Possible abscess vs blood Possible stomatitis P:   Antibiotics for pneumonia were completed Continue vancomycin and cefepime, started 3/23,  for stomatitis Dc CVL  ENDOCRINE A:   Hyperglycemia, on steroids Hypothyroidism P:   Continue SSI, CBG Continue Synthroid  NEUROLOGIC A:   Toxic metabolic  encephalopathy, starting to clear his mental status History of CVA with residual deficits P:   RASS goal: 0 Dc Precedex PT consult   FAMILY DISCUSSIONS:  Full code. Family very upset at outcome, bleeding - see prior notes, NO HEPARIN per their prior experience  - Inter-disciplinary family meet or Palliative Care meeting due by:  done  CODE STATUS Full Code  DISPO Keep in ICU while on CRRT   Independent critical care time  Cyril Mourning MD. [facility]  Pulmonary & Critical care Pager [phone] If no response call [phone]    05/29/2016, 9:21 AM

## 2016-05-29 NOTE — Progress Notes (Signed)
Patient ID: Omar [name], male   DOB: 11/24/1945, 71 y.o.   MRN: [id number]  [facility] Progress Note   Subjective:    CRRT resumed 3/24 Net weight loss since then 3+ kg down UOP 3/28 liters/24 hours on Q12H lasix  (has been making good urine since 3/22 just not clearing) CXR still looks wet    Objective:   BP 92/65   Pulse 61   Temp 97.6 F (36.4 C) (Oral)   Resp 20   Ht 5' 6" (1.676 m)   Wt (!) 154.2 kg (339 lb 15.2 oz)   SpO2 100%   BMI 54.87 kg/m   Intake/Output Summary (Last 24 hours) at 05/29/16 0724 Last data filed at 05/29/16 0700  Gross per 24 hour  Intake          3149.62 ml  Output             6554 ml  Net         -3404.38 ml   Weight change: -3.652 kg (-8 lb 0.8 oz)  Physical Exam: VS as noted Last recorded CVP 5  Trach/L central line/foley  Right IJ vascath replaced 3/23 Awake, responds to questions, smiling Pulse regular rhythm, Normal rate, S1 and S2 normal Diminished breath sounds bilaterally-no distinct rales or rhonchi Soft, obese, nontender 2-3+ upper and lower extremity edema, scrotal edema, pitting edema abdominal wall  Imaging: Dg Chest Port 1 View  Result Date: 05/29/2016 CLINICAL DATA:  Respiratory failure.  Shortness of breath. EXAM: PORTABLE CHEST 1 VIEW COMPARISON:  05/26/2016 . FINDINGS: Tracheostomy tube, feeding tube, bilateral IJ lines in stable position. Cardiomegaly with diffuse bilateral pulmonary infiltrates consistent pulmonary edema. Bilateral pleural effusions. Surgical clips right neck. IMPRESSION: 1. Lines and tubes in stable position. 2. Cardiomegaly with diffuse bilateral pulmonary infiltrates and bilateral pleural effusions consistent with CHF. Electronically Signed   By: Thomas  Register   On: 05/29/2016 06:43    Labs:  Recent Labs Lab 05/25/16 0430 05/26/16 0412 05/27/16 0359 05/27/16 1653 05/28/16 0409 05/28/16 1701 05/29/16 0432  NA 136 135 136 135 135 136 137  K 3.9 3.6 3.7 3.7 3.5 3.4*  3.4*  CL 103 101 103 102 99* 100* 101  CO2 27 25 25 25 25 27 28  GLUCOSE 121* 141* 152* 151* 156* 151* 172*  BUN 30* 51* 82* 80* 83* 66* 60*  CREATININE 2.27* 3.02* 3.45* 2.87* 2.51* 1.94* 1.61*  CALCIUM 8.7* 8.8* 8.7* 8.5* 8.4* 8.4* 8.4*  PHOS 2.7 3.3 3.0 2.5 2.5 3.1 2.8     Recent Labs Lab 05/23/16 0415 06/03/2016 0400 05/25/16 0430 05/26/16 0411 05/27/16 0359 05/28/16 0409 05/29/16 0432  WBC 8.4 10.2 11.8* 10.6* 20.9* 14.9* 14.9*  NEUTROABS 6.7 8.2* 10.3*  --   --   --   --   HGB 8.0* 8.2* 7.3* 8.2* 8.2* 8.5* 8.9*  HCT 25.4* 26.3* 23.2* 24.9* 26.0* 26.8* 28.0*  MCV 100.0 103.5* 103.1* 104.2* 104.0* 102.7* 103.7*  PLT 69* 65* 67* 80* 96* 72* 64*    Medications:    . sodium chloride   Intravenous Once  . ceFEPime (MAXIPIME) IV  2 g Intravenous Q12H  . chlorhexidine gluconate (MEDLINE KIT)  15 mL Mouth Rinse BID  . Chlorhexidine Gluconate Cloth  6 each Topical Daily  . darbepoetin (ARANESP) injection - NON-DIALYSIS  150 mcg Subcutaneous Q Mon-1800  . feeding supplement (PRO-STAT SUGAR FREE 64)  30 mL Per Tube QID  . furosemide  160 mg Intravenous Q12H  .   hydrocortisone sod succinate (SOLU-CORTEF) inj  50 mg Intravenous Q6H  . insulin aspart  0-9 Units Subcutaneous Q4H  . levothyroxine  125 mcg Per Tube QAC breakfast  . mouth rinse  15 mL Mouth Rinse QID  . pantoprazole sodium  40 mg Per Tube Daily  . polyethylene glycol  17 g Oral Daily  . vancomycin  1,500 mg Intravenous Q24H   . sodium chloride 10 mL/hr at 05/28/16 0400  . dexmedetomidine (PRECEDEX) IV infusion 0.5 mcg/kg/hr (05/29/16 0700)  . feeding supplement (VITAL HIGH PROTEIN) 1,000 mL (05/29/16 0300)  . norepinephrine (LEVOPHED) 72m / 2574minfusion 8 mcg/min (05/29/16 0700)  . dialysis replacement fluid (prismasate) 350 mL/hr at 05/28/16 1946  . dialysis replacement fluid (prismasate) 350 mL/hr at 05/28/16 1932  . dialysate (PRISMASATE) 1,500 mL/hr at 05/29/16 0450  . vasopressin (PITRESSIN) infusion - *FOR  SHOCK* Stopped (05/26/16 0815)    Assessment/ Plan:    HTN, MO, h/o CVA, PAF, chronic anticoagulation, transferred from [facility] 3/5 w/AMS, aspiration, HFlu PNA, sepsis, hypoxic/hypercarbic resp failure (trach), hemorrhagic shock 2/2 RP hematoma, consequent anuric AKI w/mass effect on R kidney displaced medially by hematoma. CRRT 3/12-3/21, 3/23 to present. Max weight prior to CRRT 171 kg 3/13.    1. Acute kidney injury: Baseline kidney function 1.1-1.3.  ATN (initially anuric) from hemorrhagic shock/sepsis. CRRT from 3/12-  3/21.   Significantly volume overloaded on exam but also hypoalbuminemic/third spacing. CRRT stopped when more hypotensive and FI02 of 40 as well as CVP of 10 but still with edema up to the abdominal wall.    Making good urine but was not clearing and volume overload still massive. CRRT resumed 3/23 and weigtht down about 3 kg since. Making good urine, nurses having hard time with the neg 50/hour removal when accounting for variable hourly UOP (3L/24 hours)  - losing more like 100-150 average which I think fine (still MASSIVE edema/anasarca/CXR wet looking 2. Hemorrhagic shock from acute retroperitoneal bleed:  1 of 2 pressors weaned - now just on norepi  3. Acute blood loss anemia secondary to retroperitoneal hemorrhage: Total 7 units (+ FFP/plts) since adm last 3/18. Aranesp added (150 QMonday)  4. Elytes- K low - 40K via tube. Phos fine.  5. VDRF- s/p trach 3/21  6. HFlu PNA -  7. BP/VOL- overall overloaded with significant edema on exam- albumin 2.4.  Making urine but positive fluid balance - keep lasix (160 BID)  going but also CRRT   CyJamal MaesMD [facility] [phone] 05/29/2016, 8:05 AM

## 2016-05-29 NOTE — Telephone Encounter (Signed)
FYI

## 2016-05-29 NOTE — Progress Notes (Signed)
   Subjective:    Patient ID: Omar FellingWilliam R [name], male    DOB: [birth year], 71 y.o.   MRN: [id number]  HPI Doing well.  Off ventilator currently.  Review of Systems     Objective:   Physical Exam Alert.  NAD. Trach site with soupy secretions.  Sutures removed.     Assessment & Plan:  Respiratory failure s/p tracheostomy.  Removed sutures.  Continue routine trach care.  Call with questions.

## 2016-05-29 NOTE — Progress Notes (Signed)
Pt placed on Full Support at this time at his request.  RR was 27-30bpm and pt had some diaphoresis and RT felt it best to place him back on ventilated support at this time.  RT will monitor.

## 2016-05-30 ENCOUNTER — Inpatient Hospital Stay (HOSPITAL_COMMUNITY): Payer: Medicare Other

## 2016-05-30 LAB — GLUCOSE, CAPILLARY
GLUCOSE-CAPILLARY: 106 mg/dL — AB (ref 65–99)
Glucose-Capillary: 96 mg/dL (ref 65–99)
Glucose-Capillary: 86 mg/dL (ref 65–99)
Glucose-Capillary: 80 mg/dL (ref 65–99)
Glucose-Capillary: 85 mg/dL (ref 65–99)

## 2016-05-30 LAB — RENAL FUNCTION PANEL
Albumin: 2.5 g/dL — ABNORMAL LOW (ref 3.5–5.0)
Albumin: 2.3 g/dL — ABNORMAL LOW (ref 3.5–5.0)
Anion gap: 9 (ref 5–15)
Anion gap: 10 (ref 5–15)
BUN: 50 mg/dL — AB (ref 6–20)
BUN: 61 mg/dL — ABNORMAL HIGH (ref 6–20)
CALCIUM: 8.9 mg/dL (ref 8.9–10.3)
CHLORIDE: 100 mmol/L — AB (ref 101–111)
CO2: 27 mmol/L (ref 22–32)
CO2: 26 mmol/L (ref 22–32)
CREATININE: 1.49 mg/dL — AB (ref 0.61–1.24)
CREATININE: 1.84 mg/dL — AB (ref 0.61–1.24)
Calcium: 8.7 mg/dL — ABNORMAL LOW (ref 8.9–10.3)
Chloride: 99 mmol/L — ABNORMAL LOW (ref 101–111)
GFR calc Af Amer: 52 mL/min — ABNORMAL LOW (ref >60)
GFR, EST AFRICAN AMERICAN: 41 mL/min — AB (ref >60)
GFR, EST NON AFRICAN AMERICAN: 45 mL/min — AB (ref >60)
GFR, EST NON AFRICAN AMERICAN: 35 mL/min — AB (ref >60)
GLUCOSE: 113 mg/dL — AB (ref 65–99)
Glucose, Bld: 136 mg/dL — ABNORMAL HIGH (ref 65–99)
POTASSIUM: 3.9 mmol/L (ref 3.5–5.1)
POTASSIUM: 4.3 mmol/L (ref 3.5–5.1)
Phosphorus: 2.5 mg/dL (ref 2.5–4.6)
Phosphorus: 2.9 mg/dL (ref 2.5–4.6)
Sodium: 136 mmol/L (ref 135–145)
Sodium: 135 mmol/L (ref 135–145)

## 2016-05-30 LAB — CBC
HCT: 29.5 % — ABNORMAL LOW (ref 39.0–52.0)
Hemoglobin: 9.5 g/dL — ABNORMAL LOW (ref 13.0–17.0)
MCH: 34.3 pg — AB (ref 26.0–34.0)
MCHC: 32.2 g/dL (ref 30.0–36.0)
MCV: 106.5 fL — ABNORMAL HIGH (ref 78.0–100.0)
PLATELETS: 62 10*3/uL — AB (ref 150–400)
RBC: 2.77 MIL/uL — ABNORMAL LOW (ref 4.22–5.81)
WBC: 13.9 10*3/uL — AB (ref 4.0–10.5)

## 2016-05-30 LAB — MAGNESIUM: Magnesium: 2.2 mg/dL (ref 1.7–2.4)

## 2016-05-30 LAB — IRON AND TIBC
Iron: 98 ug/dL (ref 45–182)
SATURATION RATIOS: 51 % — AB (ref 17.9–39.5)
TIBC: 190 ug/dL — AB (ref 250–450)
UIBC: 92 ug/dL

## 2016-05-30 MED ORDER — HYDROCORTISONE NA SUCCINATE PF 100 MG IJ SOLR
50.0000 mg | Freq: Every day | INTRAMUSCULAR | Status: DC
  Administered 2016-05-31: 50 mg via INTRAVENOUS
  Filled 2016-05-30: qty 1

## 2016-05-30 MED ORDER — DEXTROSE 5 % IV SOLN
1.0000 g | INTRAVENOUS | Status: DC
  Administered 2016-05-30 – 2016-06-03 (×5): 1 g via INTRAVENOUS
  Filled 2016-05-30 (×5): qty 1

## 2016-05-30 MED ORDER — METOPROLOL TARTRATE 5 MG/5ML IV SOLN
2.5000 mg | INTRAVENOUS | Status: DC | PRN
Start: 2016-05-30 — End: 2016-06-21
  Administered 2016-05-30: 5 mg via INTRAVENOUS
  Administered 2016-05-30: 2.5 mg via INTRAVENOUS
  Administered 2016-05-31 – 2016-06-02 (×2): 5 mg via INTRAVENOUS
  Filled 2016-05-30 (×4): qty 5

## 2016-05-30 NOTE — Progress Notes (Signed)
[facility]  [facility], last BP 96/50.  Holding scheduled 2200 lasix infusion.

## 2016-05-30 NOTE — Progress Notes (Signed)
SLP Cancellation Note  Patient Details Name: Omar FellingWilliam R [name] MRN: [id number] DOB: December 20, 1945   Cancelled treatment:       Reason Eval/Treat Not Completed: Copious secretions per RN; agreed to defer til next date.  Blenda MountsCouture, Icel Castles [name] 05/30/2016, 3:44 PM

## 2016-05-30 NOTE — Care Management Note (Signed)
Case Management Note Previous CM note initiated by Omar Parrlaxton, Omar S, RN 05/28/2016, 2:22 PM   Patient Details  Name: Omar [name] MRN: [id number] Date of Birth: 05-Nov-1945  Subjective/Objective: Pt admitted for acute resp failure - on ventilator                   Action/Plan:  PTA from home with daughter and grandkids.  Pt is wheelchair bound at home.  CM will continue to follow for discharge needs   Expected Discharge Date:                  Expected Discharge Plan:  Long Term Acute Care (LTAC)  In-House Referral:  Clinical Social Work  Discharge planning Services  CM Consult  Post Acute Care Choice:    Choice offered to:     DME Arranged:    DME Agency:     HH Arranged:    HH Agency:     Status of Service:  In process, will continue to follow  If discussed at Long Length of Stay Meetings, dates discussed:  3/27  Additional Comments:  05/30/16- 0830- Donn PieriniKristi Silvina Hackleman RN, CM- discussed case with DR. A- ok for referral to both LTACHs- [name]/p trach- pt weaning vent- on TC during day, restarted CVVHD on 3/24, continued TF, IV lasix, weaning Levophed. Reported off to unit CM- Omar [name] [facility]  Remains appropriate for continued stay.    Omar [name], Omar S, RN 05/09/2016, 2:22 PM--Pt going for trach today.  Pt will have 24 hour CRRT holiday starting today and will resume tomorrow  05/23/16 Discussed in LOS 05/23/16 - remains appropriate for continued stay   05/22/16  Pt remains ventilated - not weaning well, remains on CRRT.  Plan is for possible trach this week  05/18/16 Discussed in LOS 05/18/16 - remains appropriate for continued stay.  Remains on ventilator   Omar [name], Omar Myung Hall, RN 05/30/2016, 10:46 AM [phone]

## 2016-05-30 NOTE — Evaluation (Signed)
Physical Therapy Evaluation Patient Details Name: Omar [name] MRN: [id number] DOB: 1945/04/07 Today's Date: 05/30/2016   History of Present Illness  71 yo admitted with respiratory failure, AMS, aspiration, sepsis, retroperitoneal bleed. ETT3/5-3/8, 3/10-3/21, trach 3/21, on CRRT. PMHx: CVA with Right hemiplegia, Asthma, Afib, gout, HTN, obesity, OA  Clinical Impression  Pt pleasant on trach collar, off pressors and currently off CRRT due to filter clot. Pt with limited ability to provide PLOF due to trach but [facility] and [facility] limited distances. Pt currently with decreased strength, balance, function, mobility and inability to perform increased transfers due to cardiopulmonary status. Pt will benefit from acute therapy to maximize function and activity to decrease burden of care. Encouraged pt to assist with daily bed mobility as well as extremity activity to increase function.   HR 110 at rest, increase to 144 EOB with return to 126 supine BP 107/60 supine (76) 54/35 (43) sitting EOB with return to supine 97/64 (73) after return to supine sats 98% on trach collar    Follow Up Recommendations LTACH;Supervision/Assistance - 24 hour    Equipment Recommendations  Other (comment) (defer to next venue)    Recommendations for Other Services OT consult     Precautions / Restrictions Precautions Precautions: Fall Precaution Comments: panda, CRRT (currently on hold), trach, flexiseal, right hemiplegia Restrictions Weight Bearing Restrictions: No      Mobility  Bed Mobility Overal bed mobility: Needs Assistance Bed Mobility: Supine to Sit;Sit to Supine     Supine to sit: Max assist;HOB elevated;+2 for physical assistance;+2 for safety/equipment Sit to supine: Max assist;+2 for physical assistance   General bed mobility comments: cues for sequence with pt attempting to reach with LUE to rail to assist with rolling but required max assist to move bil LE  to EOB as well as max assist to elevate trunk from surface. Assist for bil LE to return to surface and control trunk back to bed. Pt able to maintain sitting balance EOB 5 min with and without use of LUE on foot rail . Total assist x3 to [facility]  Transfers                 General transfer comment: unable to attempt at this time  Ambulation/Gait                Stairs            Wheelchair Mobility    Modified Rankin (Stroke Patients Only)       Balance Overall balance assessment: Needs assistance   Sitting balance-Leahy Scale: Fair Sitting balance - Comments: 5 min EOB with and without LUE support with cues for midline position and anterior lean at times                                     Pertinent Vitals/Pain Pain Assessment: Faces Pain Score: 4  Pain Location: right flank Pain Intervention(s): Limited activity within patient's tolerance;Repositioned;Monitored during session    Home Living Family/patient expects to be discharged to:: Private residence Living Arrangements: Spouse/significant other             Home Equipment: Wheelchair - manual      Prior Function Level of Independence: Needs assistance   Gait / Transfers Assistance Needed: pt reports [facility] and walks limited distance in home with assist  Hand Dominance        Extremity/Trunk Assessment   Upper Extremity Assessment Upper Extremity Assessment: RUE deficits/detail;LUE deficits/detail RUE Deficits / Details: pt with history of right hemiplegia LUE Deficits / Details: decreased strength grossly 2/5    Lower Extremity Assessment Lower Extremity Assessment: RLE deficits/detail;LLE deficits/detail RLE Deficits / Details: history of right hemiplegia LLE Deficits / Details: decreased strength grossly 2-/5, pt unable to bend knee more than 45 degrees against gravity    Cervical / Trunk Assessment Cervical /  Trunk Assessment: Kyphotic  Communication   Communication: Tracheostomy  Cognition Arousal/Alertness: Awake/alert Behavior During Therapy: WFL for tasks assessed/performed Overall Cognitive Status: Difficult to assess                                        General Comments      Exercises     Assessment/Plan    PT Assessment Patient needs continued PT services  PT Problem List Decreased strength;Decreased mobility;Decreased safety awareness;Decreased activity tolerance;Cardiopulmonary status limiting activity;Decreased balance;Decreased knowledge of use of DME       PT Treatment Interventions DME instruction;Therapeutic activities;Gait training;Therapeutic exercise;Patient/family education;Functional mobility training;Balance training;Neuromuscular re-education    PT Goals (Current goals can be found in the Care Plan section)  Acute Rehab PT Goals Patient Stated Goal: be able to get up PT Goal Formulation: With patient Time For Goal Achievement: 06/13/16 Potential to Achieve Goals: Fair    Frequency Min 2X/week   Barriers to discharge Decreased caregiver support      Co-evaluation               End of Session Equipment Utilized During Treatment: Oxygen Activity Tolerance: Patient tolerated treatment well Patient left: in bed;with call bell/phone within reach;with nursing/sitter in room Nurse Communication: Mobility status;Precautions PT Visit Diagnosis: Muscle weakness (generalized) (M62.81);Hemiplegia and hemiparesis Hemiplegia - Right/Left: Right Hemiplegia - dominant/non-dominant: Non-dominant Hemiplegia - caused by: Other cerebrovascular disease    Time: 0841-0903 PT Time Calculation (min) (ACUTE ONLY): 22 min   Charges:   PT Evaluation $PT Eval High Complexity: 1 Procedure     PT G Codes:        Delaney Meigs, PT [phone]   Mariateresa Batra B Juri Dinning 05/30/2016, 12:13 PM

## 2016-05-30 NOTE — Progress Notes (Signed)
He started complaining of right upper extremity pain to me on rounds 3/26 When pain persisted 3/27, I ordered imaging studies which showed right proximal humerus fracture I have asked orthopedics to advise. When I updated daughter about this, she was verbally abusive to staff and to other family member -she had to be escorted out by security. Patient himself was quite appreciative of the care rendered and I explained to him orthopedic recommendation of sling. Not clear as to etiology and when this might have happened at this time. My review of his record does not show any history of fall or injury. His chest x-rays are difficult to review since they do not all include the right humerus  Oretha MilchALVA,Omar [name] V. MD

## 2016-05-30 NOTE — Progress Notes (Signed)
Patient ID: JONG RICKMAN, male   DOB: [birth year], 71 y.o.   MRN: [id number]  [facility] Progress Note   Subjective:    CRRT resumed 3/24 Net weight loss since then 4+ kg down UOP has dropped off Off pressors    Objective:   BP 97/64   Pulse (!) 141   Temp 97.6 F (36.4 C) (Oral)   Resp (!) 25   Ht 5' 6" (1.676 m)   Wt (!) 153.8 kg (339 lb 1.1 oz)   SpO2 100%   BMI 54.73 kg/m   Intake/Output Summary (Last 24 hours) at 05/30/16 7989 Last data filed at 05/30/16 0900  Gross per 24 hour  Intake           2143.3 ml  Output             5249 ml  Net          -3105.7 ml   Weight change: -0.4 kg (-14.1 oz)  Physical Exam: VS as noted Last recorded CVP 5 (no setup for another CVP at this time) Trach/L central line/foley  Right IJ vascath replaced 3/23 Awake, responds to questions, smiling Pulse regular rhythm, Normal rate, S1 and S2 normal Diminished breath sounds bilaterally-no distinct rales or rhonchi Soft, obese, nontender 2-3+ lower extremity edema, persistent scrotal edema, pitting edema abdominal wall Urine in foley brown  Imaging: Dg Chest Port 1 View  Result Date: 05/29/2016 CLINICAL DATA:  Respiratory failure.  Shortness of breath. EXAM: PORTABLE CHEST 1 VIEW COMPARISON:  05/26/2016 . FINDINGS: Tracheostomy tube, feeding tube, bilateral IJ lines in stable position. Cardiomegaly with diffuse bilateral pulmonary infiltrates consistent pulmonary edema. Bilateral pleural effusions. Surgical clips right neck. IMPRESSION: 1. Lines and tubes in stable position. 2. Cardiomegaly with diffuse bilateral pulmonary infiltrates and bilateral pleural effusions consistent with CHF. Electronically Signed   ByMarcello Moores  Register   On: 05/29/2016 06:43    Labs:  Recent Labs Lab 05/27/16 0359 05/27/16 1653 05/28/16 0409 05/28/16 1701 05/29/16 0432 05/29/16 1600 05/30/16 0517  NA 136 135 135 136 137 138 136  K 3.7 3.7 3.5 3.4* 3.4* 3.6 3.9  CL 103 102 99*  100* 101 101 100*  CO2 _0 GLUCOSE 152* 151* 156* 151* 172* 118* 136*  BUN 82* 80* 83* 66* 60* 54* 50*  CREATININE 3.45* 2.87* 2.51* 1.94* 1.61* 1.44* 1.49*  CALCIUM 8.7* 8.5* 8.4* 8.4* 8.4* 8.4* 8.7*  PHOS 3.0 2.5 2.5 3.1 2.8 2.6 2.5     Recent Labs Lab 06/01/2016 0400 05/25/16 0430 05/26/16 0411 05/27/16 0359 05/28/16 0409 05/29/16 0432  WBC 10.2 11.8* 10.6* 20.9* 14.9* 14.9*  NEUTROABS 8.2* 10.3*  --   --   --   --   HGB 8.2* 7.3* 8.2* 8.2* 8.5* 8.9*  HCT 26.3* 23.2* 24.9* 26.0* 26.8* 28.0*  MCV 103.5* 103.1* 104.2* 104.0* 102.7* 103.7*  PLT 65* 67* 80* 96* 72* 64*    Medications:    . sodium chloride   Intravenous Once  . ceFEPime (MAXIPIME) IV  2 g Intravenous Q12H  . chlorhexidine gluconate (MEDLINE KIT)  15 mL Mouth Rinse BID  . Chlorhexidine Gluconate Cloth  6 each Topical Daily  . darbepoetin (ARANESP) injection - NON-DIALYSIS  150 mcg Subcutaneous Q Mon-1800  . feeding supplement (PRO-STAT SUGAR FREE 64)  30 mL Per Tube QID  . furosemide  160 mg Intravenous Q12H  . [START ON 05/31/2016] hydrocortisone sod succinate (SOLU-CORTEF) inj  50 mg  Intravenous Daily  . insulin aspart  0-9 Units Subcutaneous Q4H  . levothyroxine  125 mcg Per Tube QAC breakfast  . mouth rinse  15 mL Mouth Rinse QID  . pantoprazole sodium  40 mg Per Tube Daily  . polyethylene glycol  17 g Oral Daily  . vancomycin  1,500 mg Intravenous Q24H   . sodium chloride 10 mL/hr at 05/30/16 0900  . feeding supplement (VITAL HIGH PROTEIN) 1,000 mL (05/30/16 0900)  . dialysis replacement fluid (prismasate) 350 mL/hr at 05/30/16 0119  . dialysis replacement fluid (prismasate) 350 mL/hr at 05/30/16 0119  . dialysate (PRISMASATE) 1,500 mL/hr at 05/30/16 8676    Assessment/ Plan:    HTN, MO, h/o CVA, PAF, chronic anticoagulation, transferred from [facility] 3/5 w/AMS, aspiration, HFlu PNA, sepsis, hypoxic/hypercarbic resp failure (trach), hemorrhagic shock 2/2 RP hematoma, consequent anuric  AKI w/mass effect on R kidney displaced medially by hematoma. CRRT 3/12-3/21, 3/23 to present. Max weight prior to CRRT 171 kg 3/13 and down all told frim 171-> 153.8 as of 3/27, but still with volume overload. CRRT held again 3/27.   1. Acute kidney injury: Baseline kidney function 1.1-1.3.   1. ATN (initially anuric) from hemorrhagic shock/sepsis. CRRT from 3/12-  3/21.    2. Significantly volume overloaded on exam but also hypoalbuminemic/third spacing.  3. CRRT stopped when more hypotensive and FI02 of 40 as well as CVP of 10 but still with edema up to the abdominal wall.     4. WAS making good urine but was not clearing and volume overload still massive. CRRT resumed 3/23 and weight down about 4 kg since.  5. UOP has dropped off - currently off CRRT (for bathing) - will leave off for now, see how he does with UOP/GFR.  6. If additional dialysis required, could "possibly" transition to IHD but with soft BP's might not tolerate. 7. On lasix 160 Q12H   2. Hemorrhagic shock from acute retroperitoneal bleed:  All pressors off currrently  3. Acute blood loss anemia secondary to retroperitoneal hemorrhage:  1. Total 7 units (+ FFP/plts) since adm last 3/18.  2. Aranesp added (150 QMonday) 3. Check Fe studies (ordered)  4. Elytes- K today fine, phos OK (on no standing replacement) 5. VDRF- s/p trach 3/21. Per CCM  6. HFlu PNA - ATB's per CCM. Pharmacy aware to reassess vanco dosing off the CRRT 7. Thrombocytopenia "acute on chronic" but persistent. Not getting any heparin.   Jamal Maes, MD [facility] [phone] Pager 05/30/2016, 9:49 AM

## 2016-05-30 NOTE — Care Management Note (Signed)
Case Management Note Previous CM note initiated by Donn PieriniKristi Webster 05/30/16 Previous CM note initiated by Cherylann Parrlaxton, Savi Lastinger S, RN 05/27/2016, 2:22 PM   Patient Details  Name: Omar [name] MRN: [id number] Date of Birth: January 05, 1946  Subjective/Objective: Pt admitted for acute resp failure - on ventilator                   Action/Plan:  PTA from home with daughter and grandkids.  Pt is wheelchair bound at home.  CM will continue to follow for discharge needs   Expected Discharge Date:                  Expected Discharge Plan:  Long Term Acute Care (LTAC)  In-House Referral:  Clinical Social Work  Discharge planning Services  CM Consult  Post Acute Care Choice:    Choice offered to:     DME Arranged:    DME Agency:     HH Arranged:    HH Agency:     Status of Service:  In process, will continue to follow  If discussed at Long Length of Stay Meetings, dates discussed:  3/27  Additional Comments: 05/30/2016 11:46 Raynald BlendSamantha Dahna Hattabaugh, RN, BSN [phone] [facility].  LTACH referral given to both agencies - CM awaiting response.  Pt is now off CRRT and renal will assess daily for IHD.    05/30/16- 0830- Donn PieriniKristi Webster RN, CM- discussed case with DR. A- ok for referral to both LTACHs- s/p trach- pt weaning vent- on TC during day, restarted CVVHD on 3/24, continued TF, IV lasix, weaning Levophed. Reported off to unit CM- Raynald BlendSamantha Dereke Neumann [facility].  Remains appropriate for continued stay.    Cherylann ParrClaxton, Mkayla Steele S, RN 05/04/2016, 2:22 PM--Pt going for trach today.  Pt will have 24 hour CRRT holiday starting today and will resume tomorrow  05/23/16 Discussed in LOS 05/23/16 - remains appropriate for continued stay   05/22/16  Pt remains ventilated - not weaning well, remains on CRRT.  Plan is for possible trach this week  05/18/16 Discussed in LOS 05/18/16 - remains appropriate for continued stay.  Remains on ventilator   Cherylann ParrClaxton,  Jaeson Molstad S, RN 05/30/2016, 11:45 AM [phone]

## 2016-05-30 NOTE — Consult Note (Signed)
Reason for Consult:Right humerus fx Referring Physician: R. Kalee [name] is an 71 y.o. male.  HPI: Omar [name] was admitted to the hospital with sepsis 2/2 H flu PNA on 3/5. He has had a complex course. 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 The patient began noticing right upper arm pain early last week. He notes that there was no precipitating event, it just starting hurting out of the blue. Primary medical team was made aware last last week and, when it didn't resolve, an x-ray showed a right proximal humerus fx.  Past Medical History:  Diagnosis Date  . Asthma   . Atrial fibrillation ([place])   . Gout   . Hypertension   . Morbid obesity ([place])   . Multinodular goiter   . Osteoarthrosis and allied disorders   . Other and unspecified hyperlipidemia   . Stroke [facility])    right sided hemiparesis  . Thrombocytopenia ([place])   . Vitamin D deficiency     Past Surgical History:  Procedure Laterality Date  . THYROID SURGERY    . TRACHEOSTOMY TUBE PLACEMENT N/A 05/28/2016   Procedure: TRACHEOSTOMY;  Surgeon: Omar Quitter, MD;  Location: [facility] OR;  Service: ENT;  Laterality: N/A;    Family History  Problem Relation Age of Onset  . Coronary artery disease Brother     Social History:  reports that he has quit smoking. He has never used smokeless tobacco. He reports that he does not drink alcohol or use drugs.  Allergies:  Allergies  Allergen Reactions  . Anacin Af [Acetaminophen]   . Heparin     Retroperitoneal bleed in 2018, previous bleed from heparin in 2013  . Niaspan [Niacin Er]     Medications: I have reviewed the patient's current medications.  Results for orders placed or performed during the hospital encounter of 05/11/2016 (from the past 48 hour(s))  Glucose, capillary     Status: Abnormal   Collection Time: 05/28/16  4:13 PM  Result Value Ref Range   Glucose-Capillary 136 (H) 65 - 99 mg/dL   Comment 1 Notify RN    Comment 2 Document in Chart   Renal  function panel (daily at 1600)     Status: Abnormal   Collection Time: 05/28/16  5:01 PM  Result Value Ref Range   Sodium 136 135 - 145 mmol/L   Potassium 3.4 (L) 3.5 - 5.1 mmol/L   Chloride 100 (L) 101 - 111 mmol/L   CO2 27 22 - 32 mmol/L   Glucose, Bld 151 (H) 65 - 99 mg/dL   BUN 66 (H) 6 - 20 mg/dL   Creatinine, Ser 1.94 (H) 0.61 - 1.24 mg/dL   Calcium 8.4 (L) 8.9 - 10.3 mg/dL   Phosphorus 3.1 2.5 - 4.6 mg/dL   Albumin 2.4 (L) 3.5 - 5.0 g/dL   GFR calc non Af Amer 33 (L) >60 mL/min   GFR calc Af Amer 38 (L) >60 mL/min    Comment: (NOTE) The eGFR has been calculated using the CKD EPI equation. This calculation has not been validated in all clinical situations. eGFR's persistently <60 mL/min signify possible Chronic Kidney Disease.    Anion gap 9 5 - 15  Glucose, capillary     Status: Abnormal   Collection Time: 05/28/16  8:08 PM  Result Value Ref Range   Glucose-Capillary 118 (H) 65 - 99 mg/dL   Comment 1 Notify RN   Glucose, capillary     Status: Abnormal  Collection Time: 05/28/16 11:30 PM  Result Value Ref Range   Glucose-Capillary 137 (H) 65 - 99 mg/dL   Comment 1 Notify RN   Magnesium     Status: None   Collection Time: 05/29/16  4:32 AM  Result Value Ref Range   Magnesium 2.0 1.7 - 2.4 mg/dL  CBC     Status: Abnormal   Collection Time: 05/29/16  4:32 AM  Result Value Ref Range   WBC 14.9 (H) 4.0 - 10.5 K/uL    Comment: REPEATED TO VERIFY   RBC 2.70 (L) 4.22 - 5.81 MIL/uL   Hemoglobin 8.9 (L) 13.0 - 17.0 g/dL    Comment: CONSISTENT WITH PREVIOUS RESULT   HCT 28.0 (L) 39.0 - 52.0 %   MCV 103.7 (H) 78.0 - 100.0 fL   MCH 33.0 26.0 - 34.0 pg   MCHC 31.8 30.0 - 36.0 g/dL   RDW NOT CALCULATED 11.5 - 15.5 %   Platelets 64 (L) 150 - 400 K/uL    Comment: CONSISTENT WITH PREVIOUS RESULT  Renal function panel     Status: Abnormal   Collection Time: 05/29/16  4:32 AM  Result Value Ref Range   Sodium 137 135 - 145 mmol/L   Potassium 3.4 (L) 3.5 - 5.1 mmol/L    Chloride 101 101 - 111 mmol/L   CO2 28 22 - 32 mmol/L   Glucose, Bld 172 (H) 65 - 99 mg/dL   BUN 60 (H) 6 - 20 mg/dL   Creatinine, Ser 1.61 (H) 0.61 - 1.24 mg/dL   Calcium 8.4 (L) 8.9 - 10.3 mg/dL   Phosphorus 2.8 2.5 - 4.6 mg/dL   Albumin 2.4 (L) 3.5 - 5.0 g/dL   GFR calc non Af Amer 41 (L) >60 mL/min   GFR calc Af Amer 48 (L) >60 mL/min    Comment: (NOTE) The eGFR has been calculated using the CKD EPI equation. This calculation has not been validated in all clinical situations. eGFR's persistently <60 mL/min signify possible Chronic Kidney Disease.    Anion gap 8 5 - 15  Glucose, capillary     Status: Abnormal   Collection Time: 05/29/16  4:36 AM  Result Value Ref Range   Glucose-Capillary 162 (H) 65 - 99 mg/dL   Comment 1 Document in Chart   Glucose, capillary     Status: Abnormal   Collection Time: 05/29/16  8:24 AM  Result Value Ref Range   Glucose-Capillary 130 (H) 65 - 99 mg/dL   Comment 1 Notify RN    Comment 2 Document in Chart   Glucose, capillary     Status: Abnormal   Collection Time: 05/29/16 11:19 AM  Result Value Ref Range   Glucose-Capillary 101 (H) 65 - 99 mg/dL   Comment 1 Notify RN    Comment 2 Document in Chart   Renal function panel (daily at 1600)     Status: Abnormal   Collection Time: 05/29/16  4:00 PM  Result Value Ref Range   Sodium 138 135 - 145 mmol/L   Potassium 3.6 3.5 - 5.1 mmol/L   Chloride 101 101 - 111 mmol/L   CO2 28 22 - 32 mmol/L   Glucose, Bld 118 (H) 65 - 99 mg/dL   BUN 54 (H) 6 - 20 mg/dL   Creatinine, Ser 1.44 (H) 0.61 - 1.24 mg/dL   Calcium 8.4 (L) 8.9 - 10.3 mg/dL   Phosphorus 2.6 2.5 - 4.6 mg/dL   Albumin 2.4 (L) 3.5 - 5.0 g/dL   GFR  calc non Af Amer 47 (L) >60 mL/min   GFR calc Af Amer 55 (L) >60 mL/min    Comment: (NOTE) The eGFR has been calculated using the CKD EPI equation. This calculation has not been validated in all clinical situations. eGFR's persistently <60 mL/min signify possible Chronic Kidney Disease.     Anion gap 9 5 - 15  Glucose, capillary     Status: Abnormal   Collection Time: 05/29/16  4:12 PM  Result Value Ref Range   Glucose-Capillary 115 (H) 65 - 99 mg/dL   Comment 1 Document in Chart   Glucose, capillary     Status: None   Collection Time: 05/29/16  7:56 PM  Result Value Ref Range   Glucose-Capillary 93 65 - 99 mg/dL   Comment 1 Notify RN    Comment 2 Document in Chart   Glucose, capillary     Status: None   Collection Time: 05/29/16 11:08 PM  Result Value Ref Range   Glucose-Capillary 82 65 - 99 mg/dL   Comment 1 Notify RN    Comment 2 Document in Chart   Glucose, capillary     Status: None   Collection Time: 05/30/16  3:01 AM  Result Value Ref Range   Glucose-Capillary 96 65 - 99 mg/dL   Comment 1 Notify RN    Comment 2 Document in Chart   Renal function panel (daily at 0500)     Status: Abnormal   Collection Time: 05/30/16  5:17 AM  Result Value Ref Range   Sodium 136 135 - 145 mmol/L   Potassium 3.9 3.5 - 5.1 mmol/L   Chloride 100 (L) 101 - 111 mmol/L   CO2 27 22 - 32 mmol/L   Glucose, Bld 136 (H) 65 - 99 mg/dL   BUN 50 (H) 6 - 20 mg/dL   Creatinine, Ser 1.49 (H) 0.61 - 1.24 mg/dL   Calcium 8.7 (L) 8.9 - 10.3 mg/dL   Phosphorus 2.5 2.5 - 4.6 mg/dL   Albumin 2.5 (L) 3.5 - 5.0 g/dL   GFR calc non Af Amer 45 (L) >60 mL/min   GFR calc Af Amer 52 (L) >60 mL/min    Comment: (NOTE) The eGFR has been calculated using the CKD EPI equation. This calculation has not been validated in all clinical situations. eGFR's persistently <60 mL/min signify possible Chronic Kidney Disease.    Anion gap 9 5 - 15  Magnesium     Status: None   Collection Time: 05/30/16  5:17 AM  Result Value Ref Range   Magnesium 2.2 1.7 - 2.4 mg/dL  CBC     Status: Abnormal   Collection Time: 05/30/16  5:19 AM  Result Value Ref Range   WBC 13.9 (H) 4.0 - 10.5 K/uL   RBC 2.77 (L) 4.22 - 5.81 MIL/uL    Comment: REPEATED TO VERIFY   Hemoglobin 9.5 (L) 13.0 - 17.0 g/dL   HCT 29.5 (L) 39.0 -  52.0 %    Comment: REPEATED TO VERIFY   MCV 106.5 (H) 78.0 - 100.0 fL    Comment: REPEATED TO VERIFY   MCH 34.3 (H) 26.0 - 34.0 pg   MCHC 32.2 30.0 - 36.0 g/dL   RDW NOT CALCULATED 11.5 - 15.5 %   Platelets 62 (L) 150 - 400 K/uL    Comment: REPEATED TO VERIFY CONSISTENT WITH PREVIOUS RESULT   Glucose, capillary     Status: None   Collection Time: 05/30/16  7:33 AM  Result Value Ref Range  Glucose-Capillary 86 65 - 99 mg/dL   Comment 1 Notify RN    Comment 2 Document in Chart   Glucose, capillary     Status: None   Collection Time: 05/30/16 11:39 AM  Result Value Ref Range   Glucose-Capillary 80 65 - 99 mg/dL   Comment 1 Notify RN    Comment 2 Document in Chart     Dg Forearm Right  Result Date: 05/30/2016 CLINICAL DATA:  Right arm pain. EXAM: RIGHT FOREARM - 2 VIEW COMPARISON:  No recent. FINDINGS: No acute bony or joint abnormality identified. No evidence of fracture or dislocation. IMPRESSION: No acute or focal abnormality. Electronically Signed   By: Marcello Moores  Register   On: 05/30/2016 11:16   Dg Chest Port 1 View  Result Date: 05/29/2016 CLINICAL DATA:  Respiratory failure.  Shortness of breath. EXAM: PORTABLE CHEST 1 VIEW COMPARISON:  05/26/2016 . FINDINGS: Tracheostomy tube, feeding tube, bilateral IJ lines in stable position. Cardiomegaly with diffuse bilateral pulmonary infiltrates consistent pulmonary edema. Bilateral pleural effusions. Surgical clips right neck. IMPRESSION: 1. Lines and tubes in stable position. 2. Cardiomegaly with diffuse bilateral pulmonary infiltrates and bilateral pleural effusions consistent with CHF. Electronically Signed   By: Marcello Moores  Register   On: 05/29/2016 06:43   Dg Humerus Right  Result Date: 05/30/2016 CLINICAL DATA:  Pain. EXAM: RIGHT HUMERUS - 2+ VIEW COMPARISON:  Chest x-ray 05/29/2016 . FINDINGS: Fracture is noted at the proximal right humeral diaphysis. Slight displacement is present. No other focal abnormality identified. IMPRESSION:  Fractures of the proximal right humeral diaphysis. Slight displacement. Electronically Signed   By: Marcello Moores  Register   On: 05/30/2016 11:18    Review of Systems  Unable to perform ROS: Patient nonverbal  Musculoskeletal: Positive for myalgias (Right upper arm).  Neurological: Positive for weakness.   Blood pressure 96/78, pulse (!) 134, temperature 98.4 F (36.9 C), temperature source Oral, resp. rate (!) 23, height _0  (1.676 m), weight (!) 153.8 kg (339 lb 1.1 oz), SpO2 98 %. Physical Exam  Constitutional: He appears well-developed and well-nourished. No distress.  HENT:  Head: Normocephalic and atraumatic.  Eyes: Conjunctivae are normal. Right eye exhibits no discharge. Left eye exhibits no discharge. No scleral icterus.  Cardiovascular: Regular rhythm.  Tachycardia present.   Respiratory: Effort normal. No respiratory distress.  GI: Soft.  Musculoskeletal:  Left shoulder, elbow, wrist, digits- no skin wounds, nontender, no instability, no blocks to motion  Sens  Ax/R/M/U intact  Mot   Ax/ R/ PIN/ M/ AIN/ U intact  Rad 2+  Right shoulder, elbow, wrist, digits- no skin wounds, paretic, TTP upper arm, pain with PROM  Sens  Ax/R/M/U intact but paresthetic  Mot   Absent  Rad 2+  Pelvis--no traumatic wounds or rash, no ecchymosis, stable to manual stress, nontender  LLE No traumatic wounds, ecchymosis, or rash  Nontender  No effusions  Knee stable to varus/ valgus and anterior/posterior stress  Sens DPN, SPN, TN intact  Motor EHL, ext, flex, evers 5/5  DP 2+, No significant edema   RLE No traumatic wounds, ecchymosis, or rash  Nontender  No effusions  Knee stable to varus/ valgus and anterior/posterior stress  Sens DPN, SPN, TN intact but paresthetic  Motor EHL, ext, flex, evers 3/5  DP 2+, No significant edema      Lymphadenopathy:    He has no cervical adenopathy.  Neurological: He is alert.  Skin: Skin is warm and dry. He is not diaphoretic.  Psychiatric: He  has  a normal mood and affect.    Assessment/Plan: Right proximal humerus fx -- Unsure of mechanism. CXR on 3/22 includes enough of humerus that you should see fx so it seems it happened since then. Should heal well in a sling, especially in a non-functional limb. He may come out of the sling for personal care if needed but should keep it on at all times to help remind people of the fracture.     Lisette Abu, PA-C Orthopedic Surgery [phone] 05/30/2016, 12:34 PM

## 2016-05-30 NOTE — Progress Notes (Signed)
PCCM Progress Note  Admission date: 05/14/2016 Referring provider: Dr. Clarene DukeMcManus, [facility]  CC: altered mental status  HPI: 71 y/o male from [facility] admitted 3/5 with altered mental status with aspiration, sepsis, hypoxic/hypercapnic respiratory failure. Course c/b retroperitoneal bleed, shock, acute renal failure & prolonged ventilation requiring tracheostomy    has a past medical history of Asthma; Atrial fibrillation (HCC); Gout; Hypertension; Morbid obesity (HCC); Multinodular goiter; Osteoarthrosis and allied disorders; Other and unspecified hyperlipidemia; Stroke [facility](HCC); Thrombocytopenia (HCC); and Vitamin D deficiency.   Antibiotics: Zosyn 3/06 >> 3/08 Rocephin (H.Flu PNA) 3/08 >> 3/14 Vancomycin 3/23 >>  Cefepime 3/23 >>   Cultures: Urine 3/05 >> negative Blood 3/06 >> negative Resp 3/6 >> H flu (b-lactamase +) Trach stoma site 3/23 >> diphtheroids   Lines/tubes: ETT 3/05 >> 3/8 ETT 3/10 >> 3/21 Trach (Dr Jenne PaneBates) 3/21 >>  Lt IJ CVL 3/07 >> 3/27 R HD catheter  >> 3/22 R IJ HD catheter 3/23 >>   Events: [facility]05  [facility] >> Haemophilus influenzae (beta lactamase positive) Echo 3/06 >> EF 65 to 70%, mod LVH, mod TR, PAS 39 mmHg CT head 3/7 >> no acute abnormality    SUBJECTIVE/OVERNIGHT/INTERVAL HX remains critically ill, on pressors & CRRT, toelrating some ATC c/o RUE pain persistent  Liquid stool  Vital signs: BP 118/74 (BP Location: Left Wrist)   Pulse (!) 119   Temp 97.6 F (36.4 C) (Oral)   Resp 19   Ht 5\' 6"  (1.676 m)   Wt (!) 339 lb 1.1 oz (153.8 kg)   SpO2 100%   BMI 54.73 kg/m   Intake/output:  Intake/Output Summary (Last 24 hours) at 05/30/16 0925 Last data filed at 05/30/16 0800  Gross per 24 hour  Intake           2083.3 ml  Output             5249 ml  Net          -3165.7 ml   I/O last 3 completed shifts: In: 3416.9 [I.V.:708.9; NG/GT:1860; IV Piggyback:848] Out: 1610 [[id number]:[id number] [Urine:1985; JXBJY:7829Other:6339; Stool:200]  EXAM Gen: Obese  man debilitated, calm   ENT: Tracheostomy in place, mouths words  Neck: no JVD, trach site looks cleaner, less odor  Lungs: decreased BL  Cardiovascular: Regular, distant  Abdomen: Soft, obese, normal bowel sounds, rectal tube in place  Musculoskeletal: No deformity  Neuro:follows commands, mobility limited RUE  Skin: Continues to have significant lower extremity and scrotal edema, pad in place to avoid skin breakdown   LABS  PULMONARY No results for input(s): PHART, PCO2ART, PO2ART, HCO3, TCO2, O2SAT in the last 168 hours.  Invalid input(s): PCO2, PO2  CBC  Recent Labs Lab 05/27/16 0359 05/28/16 0409 05/29/16 0432  HGB 8.2* 8.5* 8.9*  HCT 26.0* 26.8* 28.0*  WBC 20.9* 14.9* 14.9*  PLT 96* 72* 64*    COAGULATION  Recent Labs Lab 05/21/2016 0400 05/25/16 0430  INR 1.35 1.45    CARDIAC   No results for input(s): TROPONINI in the last 168 hours. No results for input(s): PROBNP in the last 168 hours.   CHEMISTRY  Recent Labs Lab 05/26/16 0411  05/27/16 0359  05/28/16 0409 05/28/16 1701 05/29/16 0432 05/29/16 1600 05/30/16 0517  NA  --   < > 136  < > 135 136 137 138 136  K  --   < > 3.7  < > 3.5 3.4* 3.4* 3.6 3.9  CL  --   < > 103  < >  99* 100* 101 101 100*  CO2  --   < > 25  < > 25 27 28 28 27   GLUCOSE  --   < > 152*  < > 156* 151* 172* 118* 136*  BUN  --   < > 82*  < > 83* 66* 60* 54* 50*  CREATININE  --   < > 3.45*  < > 2.51* 1.94* 1.61* 1.44* 1.49*  CALCIUM  --   < > 8.7*  < > 8.4* 8.4* 8.4* 8.4* 8.7*  MG 2.4  --  2.1  --  1.9  --  2.0  --  2.2  PHOS  --   < > 3.0  < > 2.5 3.1 2.8 2.6 2.5  < > = values in this interval not displayed. Estimated Creatinine Clearance: 63.3 mL/min (A) (by C-G formula based on SCr of 1.49 mg/dL (H)).   LIVER  Recent Labs Lab 2016/06/22 0400 05/25/16 0430  05/28/16 0409 05/28/16 1701 05/29/16 0432 05/29/16 1600 05/30/16 0517  ALBUMIN 2.8* 2.4*  < > 2.3* 2.4* 2.4* 2.4* 2.5*  INR 1.35 1.45  --   --   --    --   --   --   < > = values in this interval not displayed.   INFECTIOUS  Recent Labs Lab 05/26/16 0413  LATICACIDVEN 1.6     ENDOCRINE CBG (last 3)   Recent Labs  05/29/16 2308 05/30/16 0301 05/30/16 0733  GLUCAP 82 96 86     IMAGING x48h  - image(s) personally visualized  -   highlighted in bold Dg Chest Port 1 View  Result Date: 05/29/2016 CLINICAL DATA:  Respiratory failure.  Shortness of breath. EXAM: PORTABLE CHEST 1 VIEW COMPARISON:  05/26/2016 . FINDINGS: Tracheostomy tube, feeding tube, bilateral IJ lines in stable position. Cardiomegaly with diffuse bilateral pulmonary infiltrates consistent pulmonary edema. Bilateral pleural effusions. Surgical clips right neck. IMPRESSION: 1. Lines and tubes in stable position. 2. Cardiomegaly with diffuse bilateral pulmonary infiltrates and bilateral pleural effusions consistent with CHF. Electronically Signed   By: Maisie Fus  Register   On: 05/29/2016 06:43    DISCUSSION: 71 year old man, history of fibrillation, obesity, admitted with respiratory failure and septic shock in the setting of H flu pneumonia. Subsequent course complicated by retroperitoneal bleeding on anticoagulation, hemorrhagic shock, acute respiratory failure and acute renal failure. s/p trach , tolerating some ATC daytime, on CVVHD. Pressors off   ASSESSMENT / PLAN:  PULMONARY A: Acute respiratory failure Pneumonia, H influenza (beta lactamase positive) Presumed OSA/OHS History of asthma Bilateral pulm infiltrates / effusions, consistent w volume overload P:   - wean with goal ATC daytime Continue bronchodilators when necessary Speech eval for PMV   CARDIOVASCULAR A:  Shock, principally hemorrhagic.  Paroxysmal atrial fibrillation History of hypertension P:  Norepinephrine off  Taper stress dose steroids No plan to restart anticoagulation at this time.   RENAL A:   Acute renal failure, presumed due to ATN in the setting of hemorrhagic shock P:    Appreciate nephrology assistance Can come off CVVHD in my opinion Goal neg balance  GASTROINTESTINAL A:   Large retroperitoneal bleed noted, some slight increased 3/18 CT scan, hemoglobin currently stable Shock liver, transaminitis, improving Hepatic lesion; noted on CT scan of the abdomen 3/18. 5 cm lesion of the dome of the liver, could represent blood or infection or infarct.  Protein calorie malnutrition P:   Continue PPI Reimage with [place]  -lesion noted on CT scan of the abdomen on  the dome of his liver. Continue tube feeds Ct  rectal tube . His scrotal edema and skin breakdown of concern  HEMATOLOGIC A:   Acute blood loss anemia, improved Severe heparin sensitivity, associated with hemorrhage Thrombocytopenia, acute on chronic -came in low at 140k P:  Follow CBC, Goal hemoglobin greater than 7.0 No heparin hold anticoagulation  SCD for DVT prophylaxis  INFECTIOUS A:   H. influenzae pneumonia, treated Septic shock ? Clinical significance hepatic lesion, ? Possible abscess vs blood Possible stomatitis P:   Antibiotics for pneumonia were completed Continue vancomycin and cefepime, started 3/23,  for stomatitis - ct x 5ds Dc CVL, not done for some reason on 3/26  ENDOCRINE A:   Hyperglycemia, on steroids Hypothyroidism P:   Continue SSI, CBG Continue Synthroid  NEUROLOGIC A:   Toxic metabolic encephalopathy, starting to clear his mental status History of CVA with residual deficits RUE pain - normally has no sensation on right side since CVA P:   RASS goal: 0 Off  Precedex PT consult XR RUE   FAMILY DISCUSSIONS:  Full code. Family very upset at outcome, bleeding - see prior notes, NO HEPARIN per their prior experience  - Inter-disciplinary family meet or Palliative Care meeting due by:  done  CODE STATUS Full Code  DISPO Keep in ICU while on CRRT   Independent critical care time 34 minutes  Cyril Mourning MD. [facility].  Pulmonary & Critical  care Pager [phone] If no response call [phone]    05/30/2016, 9:25 AM

## 2016-05-30 NOTE — Progress Notes (Signed)
Pharmacy Antibiotic Note  Omar [name] is a 71 y.o. male now with possible stomatitis, already completed course of antibiotics for pneumonia. On day #5 of vanc/cefepime, planning to dc vanc tomorrow after central line change. Remains on CRRT.    Plan: Vancomycin 1500 mg q24h Cefepime 2 g q12h Hold off on VR as planning to discontinue vancomycin tomorrow Monitor CRRT, cultures   Height: 5\' 6"  (167.6 cm) Weight: (!) 339 lb 1.1 oz (153.8 kg) IBW/kg (Calculated) : 63.8  Temp (24hrs), Avg:97.5 F (36.4 C), Min:97.3 F (36.3 C), Max:98 F (36.7 C)   Recent Labs Lab 05/25/16 0430 05/26/16 0411  05/26/16 0413 05/27/16 0359  05/28/16 0409 05/28/16 1701 05/29/16 0432 05/29/16 1600 05/30/16 0517  WBC 11.8* 10.6*  --   --  20.9*  --  14.9*  --  14.9*  --   --   CREATININE 2.27*  --   < >  --  3.45*  < > 2.51* 1.94* 1.61* 1.44* 1.49*  LATICACIDVEN  --   --   --  1.6  --   --   --   --   --   --   --   < > = values in this interval not displayed.  Estimated Creatinine Clearance: 63.3 mL/min (A) (by C-G formula based on SCr of 1.49 mg/dL (H)).    Allergies  Allergen Reactions  . Anacin Af [Acetaminophen]   . Heparin     Retroperitoneal bleed in 2018, previous bleed from heparin in 2013  . Niaspan [Niacin Er]     3/6 MRSA pcr negative 3/6 Blood >> negative 3/5 Urine >> negative 3/6 Resp >> H. Influenzae, beta lactamase positive 3/23 resp cx: corynebacterium   Zosyn 3/6 >> 3/8 CTX 3/8 >> 3/14 Cefepime 3/23 >> Vancomycin 3/23 >>   Agapito GamesAlison Gisselle Galvis, PharmD, BCPS Clinical Pharmacist 05/30/2016 9:23 AM

## 2016-05-31 ENCOUNTER — Telehealth: Payer: Self-pay | Admitting: Pharmacist

## 2016-05-31 LAB — RENAL FUNCTION PANEL
ANION GAP: 9 (ref 5–15)
ANION GAP: 9 (ref 5–15)
Albumin: 2 g/dL — ABNORMAL LOW (ref 3.5–5.0)
Albumin: 2.1 g/dL — ABNORMAL LOW (ref 3.5–5.0)
BUN: 81 mg/dL — AB (ref 6–20)
BUN: 95 mg/dL — ABNORMAL HIGH (ref 6–20)
CALCIUM: 8.4 mg/dL — AB (ref 8.9–10.3)
CHLORIDE: 101 mmol/L (ref 101–111)
CO2: 26 mmol/L (ref 22–32)
CO2: 26 mmol/L (ref 22–32)
CREATININE: 2.54 mg/dL — AB (ref 0.61–1.24)
CREATININE: 2.9 mg/dL — AB (ref 0.61–1.24)
Calcium: 8.5 mg/dL — ABNORMAL LOW (ref 8.9–10.3)
Chloride: 100 mmol/L — ABNORMAL LOW (ref 101–111)
GFR calc Af Amer: 27 mL/min — ABNORMAL LOW (ref >60)
GFR calc non Af Amer: 24 mL/min — ABNORMAL LOW (ref >60)
GFR calc non Af Amer: 20 mL/min — ABNORMAL LOW (ref >60)
GFR, EST AFRICAN AMERICAN: 23 mL/min — AB (ref >60)
Glucose, Bld: 121 mg/dL — ABNORMAL HIGH (ref 65–99)
Glucose, Bld: 138 mg/dL — ABNORMAL HIGH (ref 65–99)
Phosphorus: 2.9 mg/dL (ref 2.5–4.6)
Phosphorus: 3.2 mg/dL (ref 2.5–4.6)
Potassium: 4 mmol/L (ref 3.5–5.1)
Potassium: 4.2 mmol/L (ref 3.5–5.1)
SODIUM: 135 mmol/L (ref 135–145)
Sodium: 136 mmol/L (ref 135–145)

## 2016-05-31 LAB — VANCOMYCIN, RANDOM: Vancomycin Rm: 37

## 2016-05-31 LAB — CBC
HCT: 25 % — ABNORMAL LOW (ref 39.0–52.0)
Hemoglobin: 7.9 g/dL — ABNORMAL LOW (ref 13.0–17.0)
MCH: 33.8 pg (ref 26.0–34.0)
MCHC: 31.6 g/dL (ref 30.0–36.0)
MCV: 106.8 fL — AB (ref 78.0–100.0)
PLATELETS: 71 10*3/uL — AB (ref 150–400)
RBC: 2.34 MIL/uL — AB (ref 4.22–5.81)
WBC: 10.3 10*3/uL (ref 4.0–10.5)

## 2016-05-31 LAB — BASIC METABOLIC PANEL
Anion gap: 8 (ref 5–15)
BUN: 80 mg/dL — ABNORMAL HIGH (ref 6–20)
CO2: 27 mmol/L (ref 22–32)
CREATININE: 2.52 mg/dL — AB (ref 0.61–1.24)
Calcium: 8.4 mg/dL — ABNORMAL LOW (ref 8.9–10.3)
Chloride: 101 mmol/L (ref 101–111)
GFR calc non Af Amer: 24 mL/min — ABNORMAL LOW (ref >60)
GFR, EST AFRICAN AMERICAN: 28 mL/min — AB (ref >60)
Glucose, Bld: 127 mg/dL — ABNORMAL HIGH (ref 65–99)
Potassium: 4 mmol/L (ref 3.5–5.1)
Sodium: 136 mmol/L (ref 135–145)

## 2016-05-31 LAB — GLUCOSE, CAPILLARY
GLUCOSE-CAPILLARY: 106 mg/dL — AB (ref 65–99)
GLUCOSE-CAPILLARY: 115 mg/dL — AB (ref 65–99)
Glucose-Capillary: 112 mg/dL — ABNORMAL HIGH (ref 65–99)
Glucose-Capillary: 116 mg/dL — ABNORMAL HIGH (ref 65–99)
Glucose-Capillary: 129 mg/dL — ABNORMAL HIGH (ref 65–99)
Glucose-Capillary: 130 mg/dL — ABNORMAL HIGH (ref 65–99)

## 2016-05-31 LAB — MAGNESIUM: Magnesium: 2 mg/dL (ref 1.7–2.4)

## 2016-05-31 MED ORDER — PRO-STAT SUGAR FREE PO LIQD
30.0000 mL | Freq: Two times a day (BID) | ORAL | Status: DC
  Administered 2016-05-31 – 2016-06-06 (×12): 30 mL
  Filled 2016-05-31 (×12): qty 30

## 2016-05-31 MED ORDER — VITAL AF 1.2 CAL PO LIQD
1000.0000 mL | ORAL | Status: DC
  Administered 2016-05-31 – 2016-06-06 (×9): 1000 mL

## 2016-05-31 NOTE — Telephone Encounter (Signed)
Called patient's home to get update on Mr. Omar [name].  Male answered phone and when asked for Omar [name] they hung up.

## 2016-05-31 NOTE — Progress Notes (Signed)
Patient ID: Omar [name], male   DOB: 07/15/1945, 70 y.o.   MRN: [id number]  [facility] Progress Note   Subjective:    CRRT resumed 3/24 and then stopped again yesterday Making small amt urine CVP was low at 4 - last dose of lasix held BP's soft overall Found to have a R humerus fracture yesterday - unclear when/how    Objective:   BP 103/64 (BP Location: Left Wrist)   Pulse (!) 111   Temp 99.1 F (37.3 C) (Oral)   Resp (!) 23   Ht 5' 6" (1.676 m)   Wt (!) 147.9 kg (326 lb)   SpO2 100%   BMI 52.62 kg/m   Intake/Output Summary (Last 24 hours) at 05/31/16 0842 Last data filed at 05/31/16 0800  Gross per 24 hour  Intake           470.17 ml  Output              155 ml  Net           315.17 ml   Weight change: -5.927 kg (-13 lb 1.1 oz)  Physical Exam: VS as noted Last recorded CVP 4 Trach/L central line/foley  Right IJ vascath replaced 3/23 Awake, responds to questions, smiling Pulse regular rhythm, Normal rate, S1 and S2 normal Diminished breath sounds bilaterally-no distinct rales or rhonchi Soft, obese, nontender 2-3+ lower extremity edema, persistent scrotal edema, pitting edema abdominal wall Urine in foley brown  Imaging: Dg Forearm Right  Result Date: 05/30/2016 CLINICAL DATA:  Right arm pain. EXAM: RIGHT FOREARM - 2 VIEW COMPARISON:  No recent. FINDINGS: No acute bony or joint abnormality identified. No evidence of fracture or dislocation. IMPRESSION: No acute or focal abnormality. Electronically Signed   By: Thomas  Register   On: 05/30/2016 11:16   Dg Humerus Right  Result Date: 05/30/2016 CLINICAL DATA:  Pain. EXAM: RIGHT HUMERUS - 2+ VIEW COMPARISON:  Chest x-ray 05/29/2016 . FINDINGS: Fracture is noted at the proximal right humeral diaphysis. Slight displacement is present. No other focal abnormality identified. IMPRESSION: Fractures of the proximal right humeral diaphysis. Slight displacement. Electronically Signed   By: Thomas   Register   On: 05/30/2016 11:18   Us Abdomen Limited Ruq  Result Date: 05/30/2016 CLINICAL DATA:  Request for evaluation of the liver abnormality seen on CT. EXAM: US ABDOMEN LIMITED - RIGHT UPPER QUADRANT COMPARISON:  CT 10 days ago. FINDINGS: Because of the patient's body habitus, there is virtually no useful information. The technologist could not comparable E evaluate the liver parenchyma, gallbladder or ductal system because of very limited penetration. . IMPRESSION: Non useful ultrasound because of body habitus.  No useful data. Electronically Signed   By: Mark  Shogry M.D.   On: 05/30/2016 16:53    Labs:  Recent Labs Lab 05/28/16 0409 05/28/16 1701 05/29/16 0432 05/29/16 1600 05/30/16 0517 05/30/16 1525 05/31/16 0447 05/31/16 0455  NA 135 136 137 138 136 135 136 135  K 3.5 3.4* 3.4* 3.6 3.9 4.3 4.0 4.0  CL 99* 100* 101 101 100* 99* 101 100*  CO2 25 27 28 28 27 26 27 26  GLUCOSE 156* 151* 172* 118* 136* 113* 127* 121*  BUN 83* 66* 60* 54* 50* 61* 80* 81*  CREATININE 2.51* 1.94* 1.61* 1.44* 1.49* 1.84* 2.52* 2.54*  CALCIUM 8.4* 8.4* 8.4* 8.4* 8.7* 8.9 8.4* 8.4*  PHOS 2.5 3.1 2.8 2.6 2.5 2.9  --  2.9     Recent Labs Lab 05/25/16   0430  05/28/16 0409 05/29/16 0432 05/30/16 0519 05/31/16 0447  WBC 11.8*  < > 14.9* 14.9* 13.9* 10.3  NEUTROABS 10.3*  --   --   --   --   --   HGB 7.3*  < > 8.5* 8.9* 9.5* 7.9*  HCT 23.2*  < > 26.8* 28.0* 29.5* 25.0*  MCV 103.1*  < > 102.7* 103.7* 106.5* 106.8*  PLT 67*  < > 72* 64* 62* 71*  < > = values in this interval not displayed.  Medications:    . sodium chloride   Intravenous Once  . ceFEPime (MAXIPIME) IV  1 g Intravenous Q24H  . chlorhexidine gluconate (MEDLINE KIT)  15 mL Mouth Rinse BID  . Chlorhexidine Gluconate Cloth  6 each Topical Daily  . darbepoetin (ARANESP) injection - NON-DIALYSIS  150 mcg Subcutaneous Q Mon-1800  . feeding supplement (PRO-STAT SUGAR FREE 64)  30 mL Per Tube QID  . furosemide  160 mg Intravenous  Q12H  . hydrocortisone sod succinate (SOLU-CORTEF) inj  50 mg Intravenous Daily  . insulin aspart  0-9 Units Subcutaneous Q4H  . levothyroxine  125 mcg Per Tube QAC breakfast  . mouth rinse  15 mL Mouth Rinse QID  . pantoprazole sodium  40 mg Per Tube Daily  . polyethylene glycol  17 g Oral Daily   . sodium chloride 10 mL/hr at 05/30/16 1800  . feeding supplement (VITAL HIGH PROTEIN) 0 mL (05/30/16 1005)    Assessment/ Plan:    HTN, MO, h/o CVA, PAF, chronic anticoagulation, transferred from [facility] 3/5 w/AMS, aspiration, HFlu PNA, sepsis, hypoxic/hypercarbic resp failure (trach), hemorrhagic shock 2/2 RP hematoma, consequent anuric AKI w/mass effect on R kidney displaced medially by hematoma. CRRT 3/12-3/21, 3/23 to present. Max weight prior to CRRT 171 kg 3/13 and down all told frim 171-> 153.8 as of 3/27, but still with volume overload. CRRT held again 3/27.   1. Acute kidney injury: Baseline kidney function 1.1-1.3.   1. ATN from hemorrhagic shock/sepsis.  2. CRRT from 3/12-3/21; 3/23-3/27    3. Weight down from max of 171 kg to current weight of 147.9  4. Still ECFV overload but low intravascular volume (last CVP 4) and BP's soft 5. Oliguric, but hold lasix w/low CVP's/ BP's (Dr/ Alva does not want to give any volume back) 6. If additional dialysis required, could "possibly" transition to IHD but with soft BP's might not tolerate (no emerging indications today).   2. Hemorrhagic shock from acute retroperitoneal bleed:   1. All pressors off currrently  3. Acute blood loss anemia secondary to retroperitoneal hemorrhage:  1. Total 7 units (+ FFP/plts) since adm   2. Aranesp added (150 QMonday) 3. Iron stores satisfactory w/tsat of 51  4. Elytes- K today fine, phos OK (on no standing replacement)  5. VDRF- s/p trach 3/21. Per CCM   6. HFlu PNA 1. ATB's per CCM.  2. Pharmacy aware to reassess vanco dosing off the CRRT 3. Need vanco level  7. Thrombocytopenia "acute on chronic"  but persistent. Not getting any heparin.    , MD [facility] [phone] Pager 05/31/2016, 8:42 AM     

## 2016-05-31 NOTE — Progress Notes (Signed)
PCCM Progress Note  Admission date: 06/06/2016 Referring provider: Dr. Clarene Duke, [facility]  CC: altered mental status  HPI: 71 y/o male from [facility] admitted 3/5 with altered mental status with aspiration, sepsis, hypoxic/hypercapnic respiratory failure. Course c/b retroperitoneal bleed, shock, acute renal failure & prolonged ventilation requiring tracheostomy    has a past medical history of Asthma; Atrial fibrillation (HCC); Gout; Hypertension; Morbid obesity (HCC); Multinodular goiter; Osteoarthrosis and allied disorders; Other and unspecified hyperlipidemia; Stroke [facility]); Thrombocytopenia (HCC); and Vitamin D deficiency.   Antibiotics: Zosyn 3/06 >> 3/08 Rocephin (H.Flu PNA) 3/08 >> 3/14 Vancomycin 3/23 >>  Cefepime 3/23 >>   Cultures: Urine 3/05 >> negative Blood 3/06 >> negative Resp 3/6 >> H flu (b-lactamase +) Trach stoma site 3/23 >> diphtheroids resp cx 3/28 >>   Lines/tubes: ETT 3/05 >> 3/8 ETT 3/10 >> 3/21 Trach (Dr Jenne Pane) 3/21 >>  Lt IJ CVL 3/07 >> 3/27 R HD catheter  >> 3/22 R IJ HD catheter 3/23 >>   Events: 3/05  Transfer to [facility] Sputum 3/06 >> Haemophilus influenzae (beta lactamase positive) Echo 3/06 >> EF 65 to 70%, mod LVH, mod TR, PAS 39 mmHg CT head 3/7 >> no acute abnormality  3/27 rt humerus fracture identified - ortho consult 3/27 RUQ [place] .Marland Kitchen Not helpful due to body habitus   SUBJECTIVE/OVERNIGHT/INTERVAL HX remains critically ill, off pressors & CRRT On ATC with copious secretions c/o RUE pain decreased Liquid stool  Vital signs: BP 103/64 (BP Location: Left Wrist)   Pulse (!) 111   Temp 99.1 F (37.3 C) (Oral)   Resp (!) 23   Ht 5\' 6"  (1.676 m)   Wt (!) 326 lb (147.9 kg)   SpO2 100%   BMI 52.62 kg/m   Intake/output:  Intake/Output Summary (Last 24 hours) at 05/31/16 0955 Last data filed at 05/31/16 0800  Gross per 24 hour  Intake           410.17 ml  Output              155 ml  Net           255.17 ml   I/O last 3 completed  shifts: In: 1366.2 [I.V.:260; Other:20; NG/GT:904.2; IV Piggyback:182] Out: 3211 [Urine:235; Other:2976]  EXAM Gen: Obese man debilitated, calm   ENT: Tracheostomy in place, copious yellow secretions  Neck: no JVD, trach site looks cleaner, less odor  Lungs: decreased BL  Cardiovascular: Regular, distant  Abdomen: Soft, obese, normal bowel sounds, rectal tube in place  Musculoskeletal: No deformity, RUE sling  Neuro:follows commands,  Rt hemiparesis  Skin: 2+ lower extremity and scrotal edema, pad in place    LABS  PULMONARY No results for input(s): PHART, PCO2ART, PO2ART, HCO3, TCO2, O2SAT in the last 168 hours.  Invalid input(s): PCO2, PO2  CBC  Recent Labs Lab 05/29/16 0432 05/30/16 0519 05/31/16 0447  HGB 8.9* 9.5* 7.9*  HCT 28.0* 29.5* 25.0*  WBC 14.9* 13.9* 10.3  PLT 64* 62* 71*    COAGULATION  Recent Labs Lab 05/25/16 0430  INR 1.45    CARDIAC   No results for input(s): TROPONINI in the last 168 hours. No results for input(s): PROBNP in the last 168 hours.   CHEMISTRY  Recent Labs Lab 05/27/16 0359  05/28/16 0409  05/29/16 0432 05/29/16 1600 05/30/16 0517 05/30/16 1525 05/31/16 0447 05/31/16 0455  NA 136  < > 135  < > 137 138 136 135 136 135  K 3.7  < > 3.5  < >  3.4* 3.6 3.9 4.3 4.0 4.0  CL 103  < > 99*  < > 101 101 100* 99* 101 100*  CO2 25  < > 25  < > 28 28 27 26 27 26   GLUCOSE 152*  < > 156*  < > 172* 118* 136* 113* 127* 121*  BUN 82*  < > 83*  < > 60* 54* 50* 61* 80* 81*  CREATININE 3.45*  < > 2.51*  < > 1.61* 1.44* 1.49* 1.84* 2.52* 2.54*  CALCIUM 8.7*  < > 8.4*  < > 8.4* 8.4* 8.7* 8.9 8.4* 8.4*  MG 2.1  --  1.9  --  2.0  --  2.2  --  2.0  --   PHOS 3.0  < > 2.5  < > 2.8 2.6 2.5 2.9  --  2.9  < > = values in this interval not displayed. Estimated Creatinine Clearance: 36.2 mL/min (A) (by C-G formula based on SCr of 2.54 mg/dL (H)).   LIVER  Recent Labs Lab 05/25/16 0430  05/29/16 0432 05/29/16 1600 05/30/16 0517  05/30/16 1525 05/31/16 0455  ALBUMIN 2.4*  < > 2.4* 2.4* 2.5* 2.3* 2.0*  INR 1.45  --   --   --   --   --   --   < > = values in this interval not displayed.   INFECTIOUS  Recent Labs Lab 05/26/16 0413  LATICACIDVEN 1.6     ENDOCRINE CBG (last 3)   Recent Labs  05/31/16 0031 05/31/16 0428 05/31/16 0723  GLUCAP 116* 106* 115*     IMAGING x48h  - image(s) personally visualized  -   highlighted in bold Dg Forearm Right  Result Date: 05/30/2016 CLINICAL DATA:  Right arm pain. EXAM: RIGHT FOREARM - 2 VIEW COMPARISON:  No recent. FINDINGS: No acute bony or joint abnormality identified. No evidence of fracture or dislocation. IMPRESSION: No acute or focal abnormality. Electronically Signed   By: Maisie Fushomas  Register   On: 05/30/2016 11:16   Dg Humerus Right  Result Date: 05/30/2016 CLINICAL DATA:  Pain. EXAM: RIGHT HUMERUS - 2+ VIEW COMPARISON:  Chest x-ray 05/29/2016 . FINDINGS: Fracture is noted at the proximal right humeral diaphysis. Slight displacement is present. No other focal abnormality identified. IMPRESSION: Fractures of the proximal right humeral diaphysis. Slight displacement. Electronically Signed   By: Maisie Fushomas  Register   On: 05/30/2016 11:18   [place] Abdomen Limited Ruq  Result Date: 05/30/2016 CLINICAL DATA:  Request for evaluation of the liver abnormality seen on CT. EXAM: US ABDOMEN LIMITED - RIGHT UPPER QUADRANT COMPARISON:  CT 10 days ago. FINDINGS: Because of the patient's body habitus, there is virtually no useful information. The technologist could not comparable E evaluate the liver parenchyma, gallbladder or ductal system because of very limited penetration. . IMPRESSION: Non useful ultrasound because of body habitus.  No useful data. Electronically Signed   By: Paulina FusiMark  Shogry M.D.   On: 05/30/2016 16:53    DISCUSSION: 71 year old man, history of fibrillation, obesity, admitted with respiratory failure and septic shock in the setting of H flu pneumonia. Subsequent  course complicated by retroperitoneal bleeding on anticoagulation, hemorrhagic shock, acute respiratory failure and acute renal failure. s/p trach , tolerating ATC daytime, off CVVHD.  Pressors off  Unfortunately has rt humerus fracture -conservative mx  ASSESSMENT / PLAN:  PULMONARY A: Acute respiratory failure Pneumonia, H influenza (beta lactamase positive) Presumed OSA/OHS History of asthma Bilateral pulm infiltrates / effusions, consistent w volume overload P:   - wean  with goal ATC as tolerated Continue bronchodilators when necessary Speech eval for PMV once secretions decrease  CARDIOVASCULAR A:  Shock, principally hemorrhagic.  Paroxysmal atrial fibrillation History of hypertension P:  Norepinephrine off  dc stress dose steroids No plan to restart anticoagulation at this time.   RENAL A:   Acute renal failure, presumed due to ATN in the setting of hemorrhagic shock P:   Appreciate nephrology assistance  off CVVHD -monitor cr & UO, may need iHD Goal neg balance  GASTROINTESTINAL A:   Large retroperitoneal bleed noted, some slight increased 3/18 CT scan, hemoglobin currently stable Shock liver, transaminitis, improving Hepatic lesion; noted on CT scan of the abdomen 3/18. 5 cm lesion of the dome of the liver, could represent blood or infection or infarct.  Protein calorie malnutrition P:   Continue PPI [place] not helpful , will need rpt CT to reimage lesion noted on CT scan of the abdomen on the dome of his liver. Continue tube feeds Ct  rectal tube . His scrotal edema and skin breakdown of concern  HEMATOLOGIC A:   Acute blood loss anemia, improved Severe heparin sensitivity, associated with hemorrhage Thrombocytopenia, acute on chronic -came in low at 140k P:  Follow CBC, Goal hemoglobin greater than 7.0 No heparin hold anticoagulation  SCD for DVT prophylaxis  INFECTIOUS A:   H. influenzae pneumonia, treated Septic shock ? Clinical significance  hepatic lesion, ? Possible abscess   P:   Antibiotics for pneumonia were completed Dc vancomycin and ct cefepime, started 3/23,  for cellulitis around stoma & secretions   ENDOCRINE A:   Hyperglycemia, on steroids Hypothyroidism P:   Continue SSI, CBG Continue Synthroid  NEUROLOGIC A:   Toxic metabolic encephalopathy, starting to clear his mental status History of CVA with residual deficits Rt humerus fracture - P:   PT consult - LTAC recommended RUE sling    FAMILY DISCUSSIONS:  Full code. Family very upset at outcome, bleeding - see prior notes, NO HEPARIN per their prior experience  - Inter-disciplinary family meet or Palliative Care meeting due by:  done  CODE STATUS Full Code  DISPO Keep in ICU given above issues  Independent critical care time 31 minutes  Cyril Mourning MD. [facility]. [place] [facility] Pager [phone] If no response call [phone]    05/31/2016, 9:55 AM

## 2016-05-31 NOTE — Progress Notes (Signed)
Sputum Sample sent to lab

## 2016-05-31 NOTE — Evaluation (Signed)
Passy-Muir Speaking Valve - Evaluation Patient Details  Name: Omar FellingWilliam R Cowger MRN: [id number] Date of Birth: 12-18-1945  Today's Date: 05/31/2016 Time: 1040-1109 SLP Time Calculation (min) (ACUTE ONLY): 29 min  Past Medical History:  Past Medical History:  Diagnosis Date  . Asthma   . Atrial fibrillation (HCC)   . Gout   . Hypertension   . Morbid obesity (HCC)   . Multinodular goiter   . Osteoarthrosis and allied disorders   . Other and unspecified hyperlipidemia   . Stroke [facility](HCC)    right sided hemiparesis  . Thrombocytopenia (HCC)   . Vitamin D deficiency    Past Surgical History:  Past Surgical History:  Procedure Laterality Date  . THYROID SURGERY    . TRACHEOSTOMY TUBE PLACEMENT N/A Jun 06, 2016   Procedure: TRACHEOSTOMY;  Surgeon: Christia Readingwight Bates, MD;  [facility];  Service: ENT;  Laterality: N/A;   HPI:  71 y/o male from [facility] admitted 3/5 with altered mental status with aspiration, sepsis, hypoxic/hypercapnic respiratory failure. Course c/b retroperitoneal bleed, shock, acute renal failure & prolonged ventilation requiring tracheostomy.  ETT 3/05-3/08; 3/10 - trach 3/21. CRRT.   Assessment / Plan / Recommendation Clinical Impression  Pt with marginal success using PMV during assessment.  Now on ATC for 24 hours or more, VS stable during cuff deflation, suctioning, and use of PMV.  Pt with intermittent air trapping given size of trach (#8), presence of deflated cuff, and secretions.  Quality of phonation poor - breathy, low volume.  Pt fatigued quickly.  Recommend trials PMV with SLP only; will monitor for readiness for FEES.  D/W RN.  SLP Visit Diagnosis: Aphonia (R49.1)    SLP Assessment  Patient needs continued Speech Lanaguage Pathology Services    Follow Up Recommendations   (tba)    Frequency and Duration min 3x week  2 weeks    PMSV Trial PMSV was placed for: 3 minute intervals Able to redirect subglottic air through upper airway:  (intermittently) Able to  Attain Phonation: No Voice Quality: Breathy;Low vocal intensity Able to Expectorate Secretions: No attempts Level of Secretion Expectoration with PMSV: Not observed Breath Support for Phonation: Inadequate Intelligibility: Intelligibility reduced Phrase: 25-49% accurate Sentence: 25-49% accurate Respirations During Trial: 22 SpO2 During Trial: 95 % Pulse During Trial: 106 Behavior: Alert;Confused   Tracheostomy Tube  Additional Tracheostomy Tube Assessment Fenestrated: No    Vent Dependency  Vent Dependent: No FiO2 (%): (S) 30 %    Cuff Deflation Trial  GO Tolerated Cuff Deflation: Yes Length of Time for Cuff Deflation Trial: 30 min Behavior: Alert        Blenda MountsCouture, Celenia Hruska [name] 05/31/2016, 11:13 AM Marchelle FolksAmanda L. Samson Fredericouture, [place]MA CCC/SLP Pager [phone]

## 2016-05-31 NOTE — Care Management Note (Signed)
Case Management Note Previous CM note initiated by Donn PieriniKristi Webster 05/30/16 Previous CM note initiated by Cherylann Parrlaxton, Edel Rivero S, RN Sep 06, 2016, 2:22 PM   Patient Details  Name: Omar FellingWilliam R Lesesne MRN: [id number] Date of Birth: 03-09-1945  Subjective/Objective: Pt admitted for acute resp failure - on ventilator                   Action/Plan:  PTA from home with daughter and grandkids.  Pt is wheelchair bound at home.  CM will continue to follow for discharge needs   Expected Discharge Date:                  Expected Discharge Plan:  Long Term Acute Care (LTAC)  In-House Referral:  Clinical Social Work  Discharge planning Services  CM Consult  Post Acute Care Choice:    Choice offered to:     DME Arranged:    DME Agency:     HH Arranged:    HH Agency:     Status of Service:  In process, will continue to follow  If discussed at Long Length of Stay Meetings, dates discussed:  3/27  Additional Comments: 05/31/2016 11:46 CM spoke with POA and daughter regarding recommendation of LTACH discharge.  CM provided choice of facilities - family will discuss recommendation of discharge plan.  Both facilites will offer bed pending bed availability - also the lack of a definite HD plan is a barrier to discharge  05/30/16 Raynald BlendSamantha Jemimah Cressy, RN, BSN [phone] [facility].  LTACH referral given to both agencies - CM awaiting response.  Pt is now off CRRT and renal will assess daily for IHD.    05/30/16- 0830- Donn PieriniKristi Webster RN, CM- discussed case with DR. A- ok for referral to both LTACHs- s/p trach- pt weaning vent- on TC during day, restarted CVVHD on 3/24, continued TF, IV lasix, weaning Levophed. Reported off to unit CM- Raynald BlendSamantha Benoit Meech [facility].  Remains appropriate for continued stay.    Cherylann ParrClaxton, Chadwick Reiswig S, RN Sep 06, 2016, 2:22 PM--Pt going for trach today.  Pt will have 24 hour CRRT holiday starting today and will resume  tomorrow  05/23/16 Discussed in LOS 05/23/16 - remains appropriate for continued stay   05/22/16  Pt remains ventilated - not weaning well, remains on CRRT.  Plan is for possible trach this week  05/18/16 Discussed in LOS 05/18/16 - remains appropriate for continued stay.  Remains on ventilator   Cherylann ParrClaxton, Denette Hass S, RN 05/31/2016, 2:20 PM [phone]

## 2016-05-31 NOTE — Progress Notes (Signed)
Pharmacy Antibiotic Note  Omar [name] is a 71 y.o. male now with possible stomatitis, already completed course of antibiotics for pneumonia. Today is day #6 of current antibiotic course. Was on CRRT from 3/12-3/21; 3/23-3/27. WBC down 10.3, afebrile.  Last dose of vancomycin was on 3/26. 3/28 vanc level supratherapeutic at 37.   Plan: D/c vancomycin Continue cefepime 1 g IV q24h F/u length of therapy Monitor renal function/ plans for further CRRT vs IHD   Height: 5\' 6"  (167.6 cm) Weight: (!) 326 lb (147.9 kg) IBW/kg (Calculated) : 63.8  Temp (24hrs), Avg:99.1 F (37.3 C), Min:98.6 F (37 C), Max:99.4 F (37.4 C)   Recent Labs Lab 05/26/16 0413 05/27/16 0359  05/28/16 0409  05/29/16 0432 05/29/16 1600 05/30/16 0517 05/30/16 0519 05/30/16 1525 05/31/16 0447 05/31/16 0455 05/31/16 0920  WBC  --  20.9*  --  14.9*  --  14.9*  --   --  13.9*  --  10.3  --   --   CREATININE  --  3.45*  < > 2.51*  < > 1.61* 1.44* 1.49*  --  1.84* 2.52* 2.54*  --   LATICACIDVEN 1.6  --   --   --   --   --   --   --   --   --   --   --   --   VANCORANDOM  --   --   --   --   --   --   --   --   --   --   --   --  37  < > = values in this interval not displayed.  Estimated Creatinine Clearance: 36.2 mL/min (A) (by C-G formula based on SCr of 2.54 mg/dL (H)).     Antimicrobials this admission:  Zosyn 3/6 >> 3/8 CTX 3/8 >> 3/14 Vancomycin 3/23 >>3/28 Cefepime 3/23 >>  Microbiology results:  3/6 MRSA PCR >> negative 3/6 Blood >> negative 3/5 Urine >> negative 3/6 Resp >> H. Influenzae, beta lactamase positive 3/23 Resp >> Diptheroids 3/26 Resp >> sent   Mackie Paienee Melanie Pellot, PharmD PGY1 Pharmacy Resident Pager: [phone] 05/31/2016 1:21 PM

## 2016-05-31 NOTE — Progress Notes (Signed)
Nutrition Follow-up  DOCUMENTATION CODES:   Morbid obesity  INTERVENTION:   To better meet re-estimated nutrition needs:   Change TF to Vital AF 1.2 at 70 ml/h (1680 ml per day)  Decrease Pro-stat to 30 ml BID  Provides 2216kcal, 156gm protein, 1362ml free water daily  NUTRITION DIAGNOSIS:   Inadequate oral intake related to inability to eat as evidenced by NPO status.  Ongoing  GOAL:   Provide needs based on ASPEN/SCCM guidelines  Met  MONITOR:   Vent status, TF tolerance, I & O's, Labs  ASSESSMENT:   70 y/o male from [facility] admitted 3/5 with altered mental status with aspiration, sepsis, hypoxic/hypercapnic respiratory failure.    Discussed patient in ICU rounds and with RN today. S/P tracheostomy 3/21. Currently on trach collar.  SLP following for PMV use. Cortrak tube placed 3/22; tip in the stomach.  Currently receiving Vital High Protein via Cortrak tube at 50 ml/h (1200 ml/day) with Prostat 30 ml QID to provide 1600 kcals, 165 gm protein, 1204 ml free water daily.  Labs reviewed. CBG's: 115-112 Medications reviewed.  Diet Order:  Diet NPO time specified  Skin:  Reviewed, no issues  Last BM:  3/28 (rectal tube)  Height:   Ht Readings from Last 1 Encounters:  05/09/16 5' 6" (1.676 m)    Weight:   Wt Readings from Last 1 Encounters:  05/31/16 (!) 326 lb (147.9 kg)    Ideal Body Weight:  64.5 kg  BMI:  Body mass index is 52.62 kg/m.  Estimated Nutritional Needs:   Kcal:  2175  Protein:  150 gm  Fluid:  2.2-2.4 L  EDUCATION NEEDS:   No education needs identified at this time  Kimberly Harris, RD, LDN, CNSC Pager [phone] After Hours Pager [phone]  

## 2016-06-01 ENCOUNTER — Inpatient Hospital Stay (HOSPITAL_COMMUNITY): Payer: Medicare Other

## 2016-06-01 LAB — RENAL FUNCTION PANEL
Albumin: 2 g/dL — ABNORMAL LOW (ref 3.5–5.0)
Anion gap: 9 (ref 5–15)
BUN: 110 mg/dL — AB (ref 6–20)
CALCIUM: 8.5 mg/dL — AB (ref 8.9–10.3)
CHLORIDE: 101 mmol/L (ref 101–111)
CO2: 26 mmol/L (ref 22–32)
CREATININE: 3.15 mg/dL — AB (ref 0.61–1.24)
GFR calc Af Amer: 21 mL/min — ABNORMAL LOW (ref >60)
GFR, EST NON AFRICAN AMERICAN: 18 mL/min — AB (ref >60)
Glucose, Bld: 138 mg/dL — ABNORMAL HIGH (ref 65–99)
Phosphorus: 3.6 mg/dL (ref 2.5–4.6)
Potassium: 3.8 mmol/L (ref 3.5–5.1)
SODIUM: 136 mmol/L (ref 135–145)

## 2016-06-01 LAB — CBC WITH DIFFERENTIAL/PLATELET
BASOS PCT: 0 %
Basophils Absolute: 0 10*3/uL (ref 0.0–0.1)
EOS PCT: 2 %
Eosinophils Absolute: 0.1 10*3/uL (ref 0.0–0.7)
HEMATOCRIT: 24.7 % — AB (ref 39.0–52.0)
Hemoglobin: 7.7 g/dL — ABNORMAL LOW (ref 13.0–17.0)
LYMPHS ABS: 0.5 10*3/uL — AB (ref 0.7–4.0)
Lymphocytes Relative: 9 %
MCH: 33.3 pg (ref 26.0–34.0)
MCHC: 31.2 g/dL (ref 30.0–36.0)
MCV: 106.9 fL — AB (ref 78.0–100.0)
MONOS PCT: 8 %
Monocytes Absolute: 0.4 10*3/uL (ref 0.1–1.0)
NEUTROS ABS: 4.4 10*3/uL (ref 1.7–7.7)
Neutrophils Relative %: 81 %
Platelets: 74 10*3/uL — ABNORMAL LOW (ref 150–400)
RBC: 2.31 MIL/uL — ABNORMAL LOW (ref 4.22–5.81)
WBC: 5.4 10*3/uL (ref 4.0–10.5)

## 2016-06-01 LAB — GLUCOSE, CAPILLARY
GLUCOSE-CAPILLARY: 122 mg/dL — AB (ref 65–99)
Glucose-Capillary: 136 mg/dL — ABNORMAL HIGH (ref 65–99)
Glucose-Capillary: 124 mg/dL — ABNORMAL HIGH (ref 65–99)
Glucose-Capillary: 118 mg/dL — ABNORMAL HIGH (ref 65–99)
Glucose-Capillary: 114 mg/dL — ABNORMAL HIGH (ref 65–99)
Glucose-Capillary: 121 mg/dL — ABNORMAL HIGH (ref 65–99)
Glucose-Capillary: 114 mg/dL — ABNORMAL HIGH (ref 65–99)

## 2016-06-01 LAB — CBC
HCT: 25 % — ABNORMAL LOW (ref 39.0–52.0)
Hemoglobin: 8.1 g/dL — ABNORMAL LOW (ref 13.0–17.0)
MCH: 34.2 pg — AB (ref 26.0–34.0)
MCHC: 32.4 g/dL (ref 30.0–36.0)
MCV: 105.5 fL — AB (ref 78.0–100.0)
PLATELETS: 74 10*3/uL — AB (ref 150–400)
RBC: 2.37 MIL/uL — ABNORMAL LOW (ref 4.22–5.81)
WBC: 7.2 10*3/uL (ref 4.0–10.5)

## 2016-06-01 LAB — MAGNESIUM: Magnesium: 2.1 mg/dL (ref 1.7–2.4)

## 2016-06-01 LAB — APTT: aPTT: 32 seconds (ref 24–36)

## 2016-06-01 LAB — PROTIME-INR
INR: 1.32
Prothrombin Time: 16.5 seconds — ABNORMAL HIGH (ref 11.4–15.2)

## 2016-06-01 MED ORDER — "THROMBI-PAD 3""X3"" EX PADS"
1.0000 | MEDICATED_PAD | Freq: Once | CUTANEOUS | Status: AC
  Administered 2016-06-01: 1 via TOPICAL
  Filled 2016-06-01: qty 1

## 2016-06-01 MED ORDER — MORPHINE SULFATE (PF) 2 MG/ML IV SOLN
2.0000 mg | INTRAVENOUS | Status: DC | PRN
  Administered 2016-06-02 – 2016-06-20 (×8): 2 mg via INTRAVENOUS
  Filled 2016-06-01 (×8): qty 1

## 2016-06-01 NOTE — Progress Notes (Signed)
  Speech Language Pathology Treatment: Omar [name] Speaking valve  Patient Details Name: Omar [name] MRN: [id number] DOB: [birth year] Today's Date: 06/01/2016 Time: 1450-1510 SLP Time Calculation (min) (ACUTE ONLY): 20 min  Assessment / Plan / Recommendation Clinical Impression  Valve placed during PMSV treatment. Patient continues to present with evidence of air trapping during brief (1-2 minute intervals) as evidenced by increased use of accessory muscles, audible expiratory wheeze, and burst of air from trach hub with valve removal. Suspect a combination of large trach size (#8), presence of deflated cuff, secretions, and possible upper airway edema. Minimal phonation achieved, low in intensity, breathy. Recommend continued po trials with SLP only.   HPI HPI: 71 y/o male from [facility] admitted 3/5 with altered mental status with aspiration, sepsis, hypoxic/hypercapnic respiratory failure. Course c/b retroperitoneal bleed, shock, acute renal failure & prolonged ventilation requiring tracheostomy.  ETT 3/05-3/08; 3/10 - trach 3/21. CRRT.      SLP Plan  Continue with current plan of care       Recommendations         Patient may use Passy-[name] Speech Valve: with SLP only PMSV Supervision: Full MD: Please consider changing trach tube to : Smaller size         SLP Visit Diagnosis: Aphonia (R49.1) Plan: Continue with current plan of care       GO             Omar LangoLeah Dimitrios Balestrieri MA, CCC-SLP [phone]    Omar [name] Omar [name] 06/01/2016, 3:26 PM

## 2016-06-01 NOTE — Care Management Note (Signed)
Case Management Note Previous CM note initiated by Omar [name] 05/30/16 Previous CM note initiated by Omar [name], Omar Spurling S, RN 05/17/2016, 2:22 PM   Patient Details  Name: Omar [name] MRN: [id number] Date of Birth: Jul 06, 1945  Subjective/Objective: Pt admitted for acute resp failure - on ventilator                   Action/Plan:  PTA from home with daughter and grandkids.  Pt is wheelchair bound at home.  CM will continue to follow for discharge needs   Expected Discharge Date:                  Expected Discharge Plan:  Long Term Acute Care (LTAC)  In-House Referral:  Clinical Social Work  Discharge planning Services  CM Consult  Post Acute Care Choice:    Choice offered to:     DME Arranged:    DME Agency:     HH Arranged:    HH Agency:     Status of Service:  In process, will continue to follow  If discussed at Long Length of Stay Meetings, dates discussed:  3/27  Additional Comments: 06/01/2016  CM discussed case with physician advisor including the barrier listed below- pt remains appropriate for continued stay.  POA has stated that [facility] able to move home - however would like to tour [facility] soon "just in case".  CM informed [facility] liason  05/31/16 CM spoke with POA and daughter regarding recommendation of LTACH discharge.  CM provided choice of facilities - family will discuss recommendation of discharge plan.  Both facilites will offer bed pending bed availability - also the lack of a definite HD plan is a barrier to discharge  05/30/16 Omar BlendSamantha Samella Lucchetti, RN, BSN [phone] [facility]TACH referral agreed to by attending.  LTACH referral given to both agencies - CM awaiting response.  Pt is now off CRRT and renal will assess daily for IHD.    05/30/16- 0830- Omar PieriniKristi Webster RN, CM- discussed case with DR. A- ok for referral to both LTACHs- [name]/p trach- pt weaning vent- on TC during day, restarted CVVHD on 3/24, continued TF, IV lasix, weaning  Levophed. Reported off to unit CM- Omar [name].  Remains appropriate for continued stay.    Omar ParrClaxton, Omar Toft S, RN 05/05/2016, 2:22 PM--Pt going for trach today.  Pt will have 24 hour CRRT holiday starting today and will resume tomorrow  05/23/16 Discussed in LOS 05/23/16 - remains appropriate for continued stay   05/22/16  Pt remains ventilated - not weaning well, remains on CRRT.  Plan is for possible trach this week  05/18/16 Discussed in LOS 05/18/16 - remains appropriate for continued stay.  Remains on ventilator   Omar ParrClaxton, Omar Verdell S, RN 06/01/2016, 11:38 AM [phone]

## 2016-06-01 NOTE — Progress Notes (Signed)
After suctioning pt's trach, noted moderate amount of oozing blood coming from under trach site. RT came to bedside. Dr. Arsenio LoaderSommer made aware and new orders carried out.

## 2016-06-01 NOTE — Progress Notes (Signed)
eLink Physician-Brief Progress Note Patient Name: Omar FellingWilliam R [name] DOB: [birth year] MRN: [id number]   Date of Service  06/01/2016  HPI/Events of Note  Bleeding from tracheostomy site - has slowed down quite a bit.   eICU Interventions  Will order: 1. Check CBC with Platelets, PT/INR. PTT STAT. 2. Dress tracheostomy site with Thrombi-pad.       Intervention Category Major Interventions: Hemorrhage - evaluation and management  Tonette Koehne Eugene 06/01/2016, 6:02 PM

## 2016-06-01 NOTE — Progress Notes (Signed)
This note also relates to the following rows which could not be included: SpO2 - Cannot attach notes to unvalidated device data  Titrated to 6L, 28%. Sat 100%. RN notified.

## 2016-06-01 NOTE — Progress Notes (Signed)
PCCM Progress Note  Admission date: 05/15/2016 Referring provider: Dr. Clarene Duke, [facility] ER  CC: altered mental status  HPI: 71 y/o male from [facility] admitted 3/5 with altered mental status with aspiration, sepsis, hypoxic/hypercapnic respiratory failure. Course c/b retroperitoneal bleed on heparin , shock, acute renal failure & prolonged ventilation requiring tracheostomy    has a past medical history of Asthma; Atrial fibrillation (HCC); Gout; Hypertension; Morbid obesity (HCC); Multinodular goiter; Osteoarthrosis and allied disorders; Other and unspecified hyperlipidemia; Stroke [facility]); Thrombocytopenia (HCC); and Vitamin D deficiency.   Antibiotics: Zosyn 3/06 >> 3/08 Rocephin (H.Flu PNA) 3/08 >> 3/14 Vancomycin 3/23 >> 3/28 Cefepime 3/23 >>   Cultures: Urine 3/05 >> negative Blood 3/06 >> negative Resp 3/6 >> H flu (b-lactamase +) Trach stoma site 3/23 >> diphtheroids resp cx 3/28 >>   Lines/tubes: ETT 3/05 >> 3/8 ETT 3/10 >> 3/21 Trach (Dr Jenne Pane) 3/21 >>  Lt IJ CVL 3/07 >> 3/27 R HD catheter  >> 3/22 R IJ HD catheter 3/23 >>   Events: 3/05  Transfer [facility] Sputum 3/06 >> Haemophilus influenzae (beta lactamase positive) Echo 3/06 >> EF 65 to 70%, mod LVH, mod TR, PAS 39 mmHg CT head 3/7 >> no acute abnormality  3/27 rt humerus fracture identified - ortho consult 3/27 RUQ [place] .Marland Kitchen Not helpful due to body habitus   SUBJECTIVE/OVERNIGHT/INTERVAL HX Chronic  critically ill On ATC since 3/27  with copious secretions c/o RUE pain decreased Liquid stool  Vital signs: BP 105/70 (BP Location: Left Wrist)   Pulse (!) 120   Temp 97.9 F (36.6 C) (Oral)   Resp (!) 22   Ht 5\' 6"  (1.676 m)   Wt (!) 324 lb (147 kg)   SpO2 100%   BMI 52.29 kg/m   Intake/output:  Intake/Output Summary (Last 24 hours) at 06/01/16 0855 Last data filed at 06/01/16 0800  Gross per 24 hour  Intake          1948.67 ml  Output             1490 ml  Net           458.67 ml   I/O last 3 completed  shifts: In: 2088.7 [I.V.:360; WU/JW:1191.4; IV Piggyback:50] Out: 1475 [Urine:1475]  EXAM Gen: Obese man debilitated, calm   ENT: Tracheostomy in place, copious yellow secretions  Neck: no JVD, trach site looks cleaner, less odor  Lungs: decreased BL  Cardiovascular: Regular, distant  Abdomen: Soft, obese, normal bowel sounds, rectal tube in place  Musculoskeletal: No deformity, RUE sling  Neuro:follows commands,  Rt hemiparesis  Skin: 2+ lower extremity and scrotal edema, pad in place    LABS  PULMONARY No results for input(s): PHART, PCO2ART, PO2ART, HCO3, TCO2, O2SAT in the last 168 hours.  Invalid input(s): PCO2, PO2  CBC  Recent Labs Lab 05/30/16 0519 05/31/16 0447 06/01/16 0350  HGB 9.5* 7.9* 8.1*  HCT 29.5* 25.0* 25.0*  WBC 13.9* 10.3 7.2  PLT 62* 71* 74*    COAGULATION No results for input(s): INR in the last 168 hours.  CARDIAC   No results for input(s): TROPONINI in the last 168 hours. No results for input(s): PROBNP in the last 168 hours.   CHEMISTRY  Recent Labs Lab 05/28/16 0409  05/29/16 0432  05/30/16 0517 05/30/16 1525 05/31/16 0447 05/31/16 0455 05/31/16 1512 06/01/16 0350  NA 135  < > 137  < > 136 135 136 135 136 136  K 3.5  < > 3.4*  < > 3.9  4.3 4.0 4.0 4.2 3.8  CL 99*  < > 101  < > 100* 99* 101 100* 101 101  CO2 25  < > 28  < > 27 26 27 26 26 26   GLUCOSE 156*  < > 172*  < > 136* 113* 127* 121* 138* 138*  BUN 83*  < > 60*  < > 50* 61* 80* 81* 95* 110*  CREATININE 2.51*  < > 1.61*  < > 1.49* 1.84* 2.52* 2.54* 2.90* 3.15*  CALCIUM 8.4*  < > 8.4*  < > 8.7* 8.9 8.4* 8.4* 8.5* 8.5*  MG 1.9  --  2.0  --  2.2  --  2.0  --   --  2.1  PHOS 2.5  < > 2.8  < > 2.5 2.9  --  2.9 3.2 3.6  < > = values in this interval not displayed. Estimated Creatinine Clearance: 29.1 mL/min (A) (by C-G formula based on SCr of 3.15 mg/dL (H)).   LIVER  Recent Labs Lab 05/30/16 0517 05/30/16 1525 05/31/16 0455 05/31/16 1512 06/01/16 0350   ALBUMIN 2.5* 2.3* 2.0* 2.1* 2.0*     INFECTIOUS  Recent Labs Lab 05/26/16 0413  LATICACIDVEN 1.6     ENDOCRINE CBG (last 3)   Recent Labs  05/31/16 2357 06/01/16 0341 06/01/16 0733  GLUCAP 136* 124* 118*     IMAGING x48h  - image(s) personally visualized  -   highlighted in bold Dg Forearm Right  Result Date: 05/30/2016 CLINICAL DATA:  Right arm pain. EXAM: RIGHT FOREARM - 2 VIEW COMPARISON:  No recent. FINDINGS: No acute bony or joint abnormality identified. No evidence of fracture or dislocation. IMPRESSION: No acute or focal abnormality. Electronically Signed   By: Maisie Fus  Register   On: 05/30/2016 11:16   Dg Chest Port 1 View  Result Date: 06/01/2016 CLINICAL DATA:  Respiratory failure. EXAM: PORTABLE CHEST 1 VIEW COMPARISON:  05/29/2016 . FINDINGS: Surgical clips right neck. Tracheostomy tube, feeding tube, right IJ line stable position. Cardiomegaly with diffuse bilateral pulmonary infiltrates and small right pleural effusion again noted consistent with CHF. No significant change. Bilateral pneumonia cannot be excluded. Low lung volumes. IMPRESSION: 1.  Tracheostomy tube, feeding tube, right IJ line stable position. 2. Cardiomegaly with pulmonary venous congestion, persistent bilateral pulmonary infiltrates/ edema, and small right pleural effusion. Findings consistent with persistent CHF. No significant change. 3. Low lung volumes . Electronically Signed   By: Maisie Fus  Register   On: 06/01/2016 07:34   Dg Humerus Right  Result Date: 05/30/2016 CLINICAL DATA:  Pain. EXAM: RIGHT HUMERUS - 2+ VIEW COMPARISON:  Chest x-ray 05/29/2016 . FINDINGS: Fracture is noted at the proximal right humeral diaphysis. Slight displacement is present. No other focal abnormality identified. IMPRESSION: Fractures of the proximal right humeral diaphysis. Slight displacement. Electronically Signed   By: Maisie Fus  Register   On: 05/30/2016 11:18   US Abdomen Limited Ruq  Result Date:  05/30/2016 CLINICAL DATA:  Request for evaluation of the liver abnormality seen on CT. EXAM: US ABDOMEN LIMITED - RIGHT UPPER QUADRANT COMPARISON:  CT 10 days ago. FINDINGS: Because of the patient's body habitus, there is virtually no useful information. The technologist could not comparable E evaluate the liver parenchyma, gallbladder or ductal system because of very limited penetration. . IMPRESSION: Non useful ultrasound because of body habitus.  No useful data. Electronically Signed   By: Paulina Fusi M.D.   On: 05/30/2016 16:53    DISCUSSION: 71 year old man, history of fibrillation, obesity, admitted with  respiratory failure and septic shock in the setting of H flu pneumonia. Subsequent course complicated by retroperitoneal bleeding on anticoagulation, hemorrhagic shock, acute respiratory failure and acute renal failure. s/p trach , tolerating ATC daytime, off CVVHD.  Pressors off  Unfortunately has rt humerus fracture -conservative mx  ASSESSMENT / PLAN:  PULMONARY A: Acute respiratory failure Pneumonia, H influenza (beta lactamase positive) Presumed OSA/OHS History of asthma Bilateral pulm infiltrates / effusions, consistent w volume overload P:   - ATC  tolerated, since 3/27 Continue bronchodilators when necessary Speech eval for PMV once secretions decrease  CARDIOVASCULAR A:  Shock, principally hemorrhagic.  Paroxysmal atrial fibrillation History of hypertension P:  Norepinephrine off  Off  stress dose steroids No plan to restart anticoagulation at this time.  Lopresso rprn for rate control  RENAL A:   Acute renal failure, presumed due to ATN in the setting of hemorrhagic shock P:   Appreciate nephrology assistance off CVVHD -monitor cr & UO, making urine, hopeful for renal recovery, may yet need iHD Goal neg balance  GASTROINTESTINAL A:   Large retroperitoneal bleed noted, some slight increased 3/18 CT scan, hemoglobin currently stable Shock liver,  transaminitis, improving Hepatic lesion; noted on CT scan of the abdomen 3/18. 5 cm lesion of the dome of the liver, could represent blood or infection or infarct.  Protein calorie malnutrition P:   Continue PPI US not helpful , will need rpt CT to reimage liver lesion  At some point Continue tube feeds Ct  rectal tube . His scrotal edema and skin breakdown of concern  HEMATOLOGIC A:   Acute blood loss anemia, improved Severe heparin sensitivity, associated with hemorrhage Thrombocytopenia, acute on chronic -came in low at 140k P:  Follow CBC, Goal hemoglobin greater than 7.0 No heparin hold anticoagulation  SCD for DVT prophylaxis  INFECTIOUS A:   H. influenzae pneumonia, treated HCAP Septic shock ? Clinical significance hepatic lesion, ? Possible abscess   P:   Antibiotics for pneumonia were completed ct cefepime, started 3/23,  for cellulitis around stoma & HCAP /secretions - await rpt resp cx, may need 10-14 ds   ENDOCRINE A:   Hyperglycemia, on steroids Hypothyroidism P:   Continue SSI, CBG Continue Synthroid  NEUROLOGIC A:   Toxic metabolic encephalopathy, starting to clear his mental status History of CVA with residual deficits Rt humerus fracture - P:   PT consult - LTAC recommended RUE sling Morphine prn pain   FAMILY DISCUSSIONS:  Full code. Family very upset at outcome, bleeding - see prior notes, NO HEPARIN per their prior experience D/w granddaughter 3/28 POA, does not want LTAC, [facility]  - Inter-disciplinary family meet or Palliative Care meeting due by:  done  CODE STATUS Full Code  DISPO Keep in ICU given above issues  Independent critical care time 31 minutes  Cyril Mourningakesh Ketura Sirek MD. [facility] LPFCCP. [facility] Pager [phone] If no response call [phone]    06/01/2016, 8:55 AM

## 2016-06-01 NOTE — Progress Notes (Signed)
Patient ID: Omar [name], male   DOB: 21-Oct-1945, 71 y.o.   MRN: [id number]  [facility] Progress Note   Subjective:    Off CRRT since 3/27 Making more urine (no lasix) Creatinine 2.9->3.1 past 24 hours so may be leveling off BUN>100 Nods no to pain except for R arm (fx)     Objective:   BP 104/60   Pulse (!) 107   Temp 98.4 F (36.9 C) (Oral)   Resp 20   Ht 5' 6" (1.676 m)   Wt (!) 147 kg (324 lb)   SpO2 100%   BMI 52.29 kg/m   Intake/Output Summary (Last 24 hours) at 06/01/16 9432 Last data filed at 06/01/16 0600  Gross per 24 hour  Intake          1748.67 ml  Output             1370 ml  Net           378.67 ml   Weight change: -0.907 kg (-2 lb)  Physical Exam: VS as noted Last recorded CVP 4 (yesterday) Trach/L central line/foley  Right IJ vascath (replaced 3/23) Awake, responds to questions, smiling Pulse regular rhythm, Normal rate, S1 and S2 normal Diminished breath sounds bilaterally-no distinct rales or rhonchi Soft, obese, nontender 2-3+ lower extremity edema, persistent scrotal edema, pitting edema abdominal wall Urine in foley is clearer and yellow (brown yesterday)  Imaging: Dg Forearm Right  Result Date: 05/30/2016 CLINICAL DATA:  Right arm pain. EXAM: RIGHT FOREARM - 2 VIEW COMPARISON:  No recent. FINDINGS: No acute bony or joint abnormality identified. No evidence of fracture or dislocation. IMPRESSION: No acute or focal abnormality. Electronically Signed   By: Marcello Moores  Register   On: 05/30/2016 11:16   Dg Humerus Right  Result Date: 05/30/2016 CLINICAL DATA:  Pain. EXAM: RIGHT HUMERUS - 2+ VIEW COMPARISON:  Chest x-ray 05/29/2016 . FINDINGS: Fracture is noted at the proximal right humeral diaphysis. Slight displacement is present. No other focal abnormality identified. IMPRESSION: Fractures of the proximal right humeral diaphysis. Slight displacement. Electronically Signed   By: Marcello Moores  Register   On: 05/30/2016 11:18   US  Abdomen Limited Ruq  Result Date: 05/30/2016 CLINICAL DATA:  Request for evaluation of the liver abnormality seen on CT. EXAM: US ABDOMEN LIMITED - RIGHT UPPER QUADRANT COMPARISON:  CT 10 days ago. FINDINGS: Because of the patient's body habitus, there is virtually no useful information. The technologist could not comparable E evaluate the liver parenchyma, gallbladder or ductal system because of very limited penetration. . IMPRESSION: Non useful ultrasound because of body habitus.  No useful data. Electronically Signed   By: Nelson Chimes M.D.   On: 05/30/2016 16:53    Labs:  Recent Labs Lab 05/29/16 0432 05/29/16 1600 05/30/16 0517 05/30/16 1525 05/31/16 0447 05/31/16 0455 05/31/16 1512 06/01/16 0350  NA 137 138 136 135 136 135 136 136  K 3.4* 3.6 3.9 4.3 4.0 4.0 4.2 3.8  CL 101 101 100* 99* 101 100* 101 101  CO2 _0 GLUCOSE 172* 118* 136* 113* 127* 121* 138* 138*  BUN 60* 54* 50* 61* 80* 81* 95* 110*  CREATININE 1.61* 1.44* 1.49* 1.84* 2.52* 2.54* 2.90* 3.15*  CALCIUM 8.4* 8.4* 8.7* 8.9 8.4* 8.4* 8.5* 8.5*  PHOS 2.8 2.6 2.5 2.9  --  2.9 3.2 3.6     Recent Labs Lab 05/29/16 0432 05/30/16 0519 05/31/16 0447 06/01/16 0350  WBC 14.9*  13.9* 10.3 7.2  HGB 8.9* 9.5* 7.9* 8.1*  HCT 28.0* 29.5* 25.0* 25.0*  MCV 103.7* 106.5* 106.8* 105.5*  PLT 64* 62* 71* 74*    Medications:    . sodium chloride   Intravenous Once  . ceFEPime (MAXIPIME) IV  1 g Intravenous Q24H  . chlorhexidine gluconate (MEDLINE KIT)  15 mL Mouth Rinse BID  . Chlorhexidine Gluconate Cloth  6 each Topical Daily  . darbepoetin (ARANESP) injection - NON-DIALYSIS  150 mcg Subcutaneous Q Mon-1800  . feeding supplement (PRO-STAT SUGAR FREE 64)  30 mL Per Tube BID  . insulin aspart  0-9 Units Subcutaneous Q4H  . levothyroxine  125 mcg Per Tube QAC breakfast  . mouth rinse  15 mL Mouth Rinse QID  . pantoprazole sodium  40 mg Per Tube Daily  . polyethylene glycol  17 g Oral Daily   .  sodium chloride 10 mL/hr at 05/30/16 1800  . feeding supplement (VITAL AF 1.2 CAL) 1,000 mL (06/01/16 0607)    Assessment/ Plan:    HTN, MO, h/o CVA, PAF, chronic anticoagulation, transferred from [facility] 3/5 w/AMS, aspiration, HFlu PNA, sepsis, hypoxic/hypercarbic resp failure (trach), hemorrhagic shock 2/2 RP hematoma, consequent anuric AKI w/mass effect on R kidney displaced medially by hematoma. CRRT 3/12-3/21, 3/23 to present. Max weight prior to CRRT 171 kg 3/13 and down all told frim 171-> 153.8 as of 3/27, but still with volume overload. CRRT held again 3/27.   1. Acute kidney injury: Baseline kidney function 1.1-1.3.   1. ATN from hemorrhagic shock/sepsis.  2. CRRT from 3/12-3/21; 3/23-3/27    3. Weight down from max of 171 kg to current weight of 147 (stable for 2 days off CRRT)  4. Still ECFV overload but low intravascular volume (last CVP 4) and BP's soft 5. Non-oliguric now with about 1.3 liters out yesterday 6. Additional dialysis may be required but no urgent indications at this time. (could "possibly" transition to IHD but with soft BP's might not tolerate).   2. Hemorrhagic shock from acute retroperitoneal bleed:   1. All pressors off currrently  3. Acute blood loss anemia secondary to retroperitoneal hemorrhage:  1. Total 7 units (+ FFP/plts) since adm   2. Aranesp added (150 QMonday) 3. Iron stores satisfactory w/tsat of 51  4. Elytes- K today fine, phos OK (on no standing replacement)  5. VDRF- s/p trach 3/21. Per CCM   6. HFlu PNA 1. ATB's per CCM.  2. Pharmacy aware to reassess vanco dosing off the CRRT 3. Random vancomycin yesterday was 37 -- at risk for additional renal toxicity from vanco - has been discontinued (but will likely hang around for a while. 4. Remains on cefipime  7. Thrombocytopenia "acute on chronic" but persistent. Not getting any heparin.    , MD  [facility] [phone] Pager 06/01/2016, 7:17 AM     

## 2016-06-02 LAB — GLUCOSE, CAPILLARY
GLUCOSE-CAPILLARY: 134 mg/dL — AB (ref 65–99)
GLUCOSE-CAPILLARY: 134 mg/dL — AB (ref 65–99)
GLUCOSE-CAPILLARY: 123 mg/dL — AB (ref 65–99)
GLUCOSE-CAPILLARY: 122 mg/dL — AB (ref 65–99)
Glucose-Capillary: 111 mg/dL — ABNORMAL HIGH (ref 65–99)
Glucose-Capillary: 119 mg/dL — ABNORMAL HIGH (ref 65–99)

## 2016-06-02 LAB — RENAL FUNCTION PANEL
ALBUMIN: 2 g/dL — AB (ref 3.5–5.0)
ANION GAP: 11 (ref 5–15)
BUN: 139 mg/dL — ABNORMAL HIGH (ref 6–20)
CALCIUM: 8.3 mg/dL — AB (ref 8.9–10.3)
CHLORIDE: 101 mmol/L (ref 101–111)
CO2: 26 mmol/L (ref 22–32)
Creatinine, Ser: 3.73 mg/dL — ABNORMAL HIGH (ref 0.61–1.24)
GFR, EST AFRICAN AMERICAN: 17 mL/min — AB (ref >60)
GFR, EST NON AFRICAN AMERICAN: 15 mL/min — AB (ref >60)
GLUCOSE: 127 mg/dL — AB (ref 65–99)
PHOSPHORUS: 3.8 mg/dL (ref 2.5–4.6)
POTASSIUM: 4.1 mmol/L (ref 3.5–5.1)
Sodium: 138 mmol/L (ref 135–145)

## 2016-06-02 LAB — CULTURE, RESPIRATORY W GRAM STAIN: Culture: NORMAL

## 2016-06-02 LAB — MAGNESIUM: MAGNESIUM: 1.9 mg/dL (ref 1.7–2.4)

## 2016-06-02 LAB — CULTURE, RESPIRATORY

## 2016-06-02 NOTE — Progress Notes (Signed)
qPhysical Therapy Treatment Patient Details Name: Omar [name] MRN: [id number] DOB: [birth year] Today's Date: 06/02/2016    History of Present Illness 71 yo admitted with respiratory failure, AMS, aspiration, sepsis, retroperitoneal bleed. ETT3/5-3/8, 3/10-3/21, trach 3/21, 3/27 pt found to have proximal humerus fx without precipitating event. PMHx: CVA with Right hemiplegia, Asthma, Afib, gout, HTN, obesity, OA    PT Comments    Pt remains pleasant but with decreased ability to transition to sitting today and maintain sitting balance. Pt reporting generalized RLE pain with all movement today. RUE in sling throughout session. Pt able to tolerate OOB to chair via maxisky. Will continue to follow to maximize strength and mobility.  VSS on trach collar throughout   Follow Up Recommendations  LTACH;Supervision/Assistance - 24 hour     Equipment Recommendations       Recommendations for Other Services       Precautions / Restrictions Precautions Precautions: Fall Precaution Comments: panda, trach, flexiseal, right hemiplegia Required Braces or Orthoses: Sling Restrictions Weight Bearing Restrictions: No RUE Weight Bearing: Non weight bearing RLE Weight Bearing: Non weight bearing    Mobility  Bed Mobility Overal bed mobility: Needs Assistance Bed Mobility: Supine to Sit     Supine to sit: Max assist;+2 for physical assistance;+2 for safety/equipment;HOB elevated     General bed mobility comments: max cues and assist to pivot legs to EOB and elevate trunk. pt initially resistant to trunk elevation but able to assist after 4 attempts to achieve sitting with 2 person assist and use of pad to fully pivot to EOB  Transfers Overall transfer level: Needs assistance               General transfer comment: Positioned pad EOB and lifted pt bed to chair with maxisky  Ambulation/Gait                 Stairs            Wheelchair Mobility    Modified Rankin  (Stroke Patients Only)       Balance Overall balance assessment: Needs assistance Sitting-balance support: Feet unsupported;Single extremity supported Sitting balance-Leahy Scale: Poor Sitting balance - Comments: EOB 6 min with LUE support on foot rail with continued min assist for balance today with cues for posture and positioning                                    Cognition Arousal/Alertness: Awake/alert Behavior During Therapy: WFL for tasks assessed/performed Overall Cognitive Status: Difficult to assess                                        Exercises      General Comments        Pertinent Vitals/Pain Pain Assessment: Faces Faces Pain Scale: Hurts little more Pain Location: RUE and RLE Pain Descriptors / Indicators: Guarding;Grimacing Pain Intervention(s): Limited activity within patient's tolerance;Repositioned;Monitored during session    Home Living                      Prior Function            PT Goals (current goals can now be found in the care plan section) Progress towards PT goals: Progressing toward goals    Frequency    Min 2X/week  PT Plan Current plan remains appropriate    Co-evaluation             End of Session Equipment Utilized During Treatment: Oxygen Activity Tolerance: Patient tolerated treatment well Patient left: in chair;with call bell/phone within reach;with nursing/sitter in room Nurse Communication: Mobility status;Need for lift equipment;Precautions;Weight bearing status PT Visit Diagnosis: Muscle weakness (generalized) (M62.81);Hemiplegia and hemiparesis Hemiplegia - Right/Left: Right Hemiplegia - dominant/non-dominant: Non-dominant Hemiplegia - caused by: Other cerebrovascular disease     Time: 0813-0841 PT Time Calculation (min) (ACUTE ONLY): 28 min  Charges:  $Therapeutic Activity: 23-37 mins                    G Codes:       Delaney Meigs,  PT [phone]    Otilia Kareem B Manuel Lawhead 06/02/2016, 10:32 AM

## 2016-06-02 NOTE — Progress Notes (Signed)
Patient ID: Omar [name], male   DOB: 24-Jun-1945, 71 y.o.   MRN: [id number]  [facility] Progress Note   Subjective:    Off CRRT since 3/27 Making more urine (no lasix) 1.6 liters yesterday Creatinine 2.9->3.1-> 3.7, BUN 139 so has not leveled off yet as I had hoped Up in the chair today - alert - denies SOB or nausea   Objective:   BP 104/61   Pulse (!) 119   Temp 98.5 F (36.9 C) (Oral)   Resp (!) 26   Ht _0  (1.676 m)   Wt (!) 150.6 kg (332 lb)   SpO2 100%   BMI 53.59 kg/m   Intake/Output Summary (Last 24 hours) at 06/02/16 0814 Last data filed at 06/02/16 0900  Gross per 24 hour  Intake             2070 ml  Output             1750 ml  Net              320 ml   Weight change: 3.629 kg (8 lb)  Physical Exam: VS as noted Last recorded CVP 4 on the 27th Trach/L central line/foley  Right IJ vascath (replaced 3/23) Awake, responds to questions, smiling, up in the chair Pulse regular rhythm, HR 110, S1 and S2 normal Diminished breath sounds bilaterally-no distinct rales or rhonchi Soft, obese, nontender 2-3+ lower extremity woody edema, persistent scrotal edema, pitting edema abdominal wall all about same Woody and chronic skin changes legs Urine in foley is clearer and yellow   Imaging: Dg Chest Port 1 View  Result Date: 06/01/2016 CLINICAL DATA:  Respiratory failure. EXAM: PORTABLE CHEST 1 VIEW COMPARISON:  05/29/2016 . FINDINGS: Surgical clips right neck. Tracheostomy tube, feeding tube, right IJ line stable position. Cardiomegaly with diffuse bilateral pulmonary infiltrates and small right pleural effusion again noted consistent with CHF. No significant change. Bilateral pneumonia cannot be excluded. Low lung volumes. IMPRESSION: 1.  Tracheostomy tube, feeding tube, right IJ line stable position. 2. Cardiomegaly with pulmonary venous congestion, persistent bilateral pulmonary infiltrates/ edema, and small right pleural effusion. Findings consistent  with persistent CHF. No significant change. 3. Low lung volumes . Electronically Signed   ByMarcello Moores  Register   On: 06/01/2016 07:34    Labs:  Recent Labs Lab 05/29/16 1600 05/30/16 0517 05/30/16 1525 05/31/16 0447 05/31/16 0455 05/31/16 1512 06/01/16 0350 06/02/16 0409  NA 138 136 135 136 135 136 136 138  K 3.6 3.9 4.3 4.0 4.0 4.2 3.8 4.1  CL 101 100* 99* 101 100* 101 101 101  CO2 _1 GLUCOSE 118* 136* 113* 127* 121* 138* 138* 127*  BUN 54* 50* 61* 80* 81* 95* 110* 139*  CREATININE 1.44* 1.49* 1.84* 2.52* 2.54* 2.90* 3.15* 3.73*  CALCIUM 8.4* 8.7* 8.9 8.4* 8.4* 8.5* 8.5* 8.3*  PHOS 2.6 2.5 2.9  --  2.9 3.2 3.6 3.8     Recent Labs Lab 05/30/16 0519 05/31/16 0447 06/01/16 0350 06/01/16 1801  WBC 13.9* 10.3 7.2 5.4  NEUTROABS  --   --   --  4.4  HGB 9.5* 7.9* 8.1* 7.7*  HCT 29.5* 25.0* 25.0* 24.7*  MCV 106.5* 106.8* 105.5* 106.9*  PLT 62* 71* 74* 74*    Medications:    . sodium chloride   Intravenous Once  . ceFEPime (MAXIPIME) IV  1 g Intravenous Q24H  . chlorhexidine gluconate (MEDLINE KIT)  15  mL Mouth Rinse BID  . Chlorhexidine Gluconate Cloth  6 each Topical Daily  . darbepoetin (ARANESP) injection - NON-DIALYSIS  150 mcg Subcutaneous Q Mon-1800  . feeding supplement (PRO-STAT SUGAR FREE 64)  30 mL Per Tube BID  . insulin aspart  0-9 Units Subcutaneous Q4H  . levothyroxine  125 mcg Per Tube QAC breakfast  . mouth rinse  15 mL Mouth Rinse QID  . pantoprazole sodium  40 mg Per Tube Daily  . polyethylene glycol  17 g Oral Daily   . sodium chloride 10 mL/hr at 06/02/16 0900  . feeding supplement (VITAL AF 1.2 CAL) 1,000 mL (06/02/16 0900)    Assessment/ Plan:    HTN, MO, h/o CVA, PAF, chronic anticoagulation, transferred from [facility] 3/5 w/AMS, aspiration, HFlu PNA, sepsis, hypoxic/hypercarbic resp failure (trach), hemorrhagic shock 2/2 RP hematoma, consequent anuric AKI w/mass effect on R kidney displaced medially by hematoma. CRRT  3/12-3/21, 3/23 to present. Max weight prior to CRRT 171 kg 3/13 and down all told frim 171-> 153.8 as of 3/27, but still with volume overload. CRRT held again 3/27.   1. Acute kidney injury: Baseline kidney function 1.1-1.3.   1. ATN from hemorrhagic shock/sepsis.  2. CRRT from 3/12-3/21; 3/23-3/27    3. Weight down from max of 171 kg to nadir weight of 147 yesterday, 150.6 today (?) 4. Still ECFV overload but low intravascular volume (last CVP 4) and BP's soft 5. Non-oliguric now  6. Additional dialysis may be required but no urgent indications for today . May need tomorrow for clearance purposes. No obvious uremic symptoms or electrolyte abnls necessitating TMT today.    2. Hemorrhagic shock from acute retroperitoneal bleed:   1. All pressors off currrently  3. Acute blood loss anemia secondary to retroperitoneal hemorrhage:  1. Total 7 units (+ FFP/plts) since adm   2. Aranesp added (150 QMonday) 3. Iron stores satisfactory w/tsat of 51  4. Elytes- K today fine, phos OK (on no standing replacement)  5. VDRF- s/p trach 3/21. Per CCM   6. HFlu PNA 1. ATB's per CCM.  2. Pharmacy aware to reassess vanco dosing off the CRRT 3. Random vancomycin yesterday was 37 -- at risk for additional renal toxicity from vanco - has been discontinued (but will likely hang around for a while. 4. Remains on cefipime  7. Thrombocytopenia "acute on chronic" but persistent. Not getting any heparin.   Jamal Maes, MD [facility] [phone] Pager 06/02/2016, 9:04 AM

## 2016-06-02 NOTE — Progress Notes (Signed)
PCCM Progress Note  Admission date: 05/14/2016 Referring provider: Dr. Clarene Duke, [facility]  CC: altered mental status  HPI: 71 y/o male from [facility] admitted 3/5 with altered mental status with aspiration, sepsis, hypoxic/hypercapnic respiratory failure. Course c/b retroperitoneal bleed on heparin , shock, acute renal failure & prolonged ventilation requiring tracheostomy    has a past medical history of Asthma; Atrial fibrillation (HCC); Gout; Hypertension; Morbid obesity (HCC); Multinodular goiter; Osteoarthrosis and allied disorders; Other and unspecified hyperlipidemia; Stroke [facility]); Thrombocytopenia (HCC); and Vitamin D deficiency.   Antibiotics: Zosyn 3/06 >> 3/08 Rocephin (H.Flu PNA) 3/08 >> 3/14 Vancomycin 3/23 >> 3/28 Cefepime 3/23 >>   Cultures: Urine 3/05 >> negative Blood 3/06 >> negative Resp 3/6 >> H flu (b-lactamase +) Trach stoma site 3/23 >> diphtheroids resp cx 3/28 >>   Lines/tubes: ETT 3/05 >> 3/8 ETT 3/10 >> 3/21 Trach (Dr Jenne Pane) 3/21 >>  Lt IJ CVL 3/07 >> 3/27 R HD catheter  >> 3/22 R IJ HD catheter 3/23 >>   Events: 3/05  Transfer to [facility] Sputum 3/06 >> Haemophilus influenzae (beta lactamase positive) Echo 3/06 >> EF 65 to 70%, mod LVH, mod TR, PAS 39 mmHg CT head 3/7 >> no acute abnormality  3/27 rt humerus fracture identified - ortho consult 3/27 RUQ [place] .Marland Kitchen Not helpful due to body habitus 3/29 - Chronic  critically ill On ATC since 3/27  with copious secretions c/o RUE pain decreased Liquid stool   SUBJECTIVE/OVERNIGHT/INTERVAL HX 3/30 - doing ATC x 24-48h per RN. Rt shoulder pain only when touched per RN. Making urine. Off CRRT. Not on pressors. Family not fully on board about LTAC per RN but looking at [facility]  Vital signs: BP 114/69   Pulse (!) 121   Temp 98.5 F (36.9 C) (Oral)   Resp (!) 25   Ht  (1.676 m)   Wt (!) 150.6 kg (332 lb)   SpO2 100%   BMI 53.59 kg/m   Intake/output:  Intake/Output Summary (Last 24 hours) at 06/02/16  0756 Last data filed at 06/02/16 0600  Gross per 24 hour  Intake             1990 ml  Output             1910 ml  Net               80 ml   I/O last 3 completed shifts: In: 3090 [I.V.:350; Other:100; NG/GT:2540; IV Piggyback:100] Out: 2605 [Urine:2355; Stool:250]  EXAM  General Appearance:    Looks chronic criticall ill OBESE - YES  Head:    Normocephalic, without obvious abnormality, atraumatic  Eyes:    PERRL - yes, conjunctiva/corneas - ues      Ears:    Normal external ear canals, both ears  Nose:   NG tube - CORTRAK +  Throat:  ETT TUBE - No but HAS TRACH + , OG tube - no  Neck:   Supple,  No enlargement/tenderness/nodules     Lungs:     Clear to auscultation bilaterally, On ATC  Chest wall:    No deformity  Heart:    S1 and S2 normal, no murmur, CVP - no.  Pressors - no  Abdomen:     Soft, no masses, no organomegaly  Genitalia:    Not done  Rectal:   not done  Extremities:   Extremities- obese with chronic edema. Rt shoulder in sling     Skin:   Intact in exposed areas .  Sacral area - no decuib reported per RN     Neurologic:   Sedation - none -> RASS - +1 . Moves all 4s - yes. CAM-ICU - neg . Orientation - seems appropriate       LABS  PULMONARY No results for input(s): PHART, PCO2ART, PO2ART, HCO3, TCO2, O2SAT in the last 168 hours.  Invalid input(s): PCO2, PO2  CBC  Recent Labs Lab 05/31/16 0447 06/01/16 0350 06/01/16 1801  HGB 7.9* 8.1* 7.7*  HCT 25.0* 25.0* 24.7*  WBC 10.3 7.2 5.4  PLT 71* 74* 74*    COAGULATION  Recent Labs Lab 06/01/16 1801  INR 1.32    CARDIAC  No results for input(s): TROPONINI in the last 168 hours. No results for input(s): PROBNP in the last 168 hours.   CHEMISTRY  Recent Labs Lab 05/29/16 0432  05/30/16 0517 05/30/16 1525 05/31/16 0447 05/31/16 0455 05/31/16 1512 06/01/16 0350 06/02/16 0409  NA 137  < > 136 135 136 135 136 136 138  K 3.4*  < > 3.9 4.3 4.0 4.0 4.2 3.8 4.1  CL 101  < > 100* 99* 101  100* 101 101 101  CO2 28  < > GLUCOSE 172*  < > 136* 113* 127* 121* 138* 138* 127*  BUN 60*  < > 50* 61* 80* 81* 95* 110* 139*  CREATININE 1.61*  < > 1.49* 1.84* 2.52* 2.54* 2.90* 3.15* 3.73*  CALCIUM 8.4*  < > 8.7* 8.9 8.4* 8.4* 8.5* 8.5* 8.3*  MG 2.0  --  2.2  --  2.0  --   --  2.1 1.9  PHOS 2.8  < > 2.5 2.9  --  2.9 3.2 3.6 3.8  < > = values in this interval not displayed. Estimated Creatinine Clearance: 24.9 mL/min (A) (by C-G formula based on SCr of 3.73 mg/dL (H)).   LIVER  Recent Labs Lab 05/30/16 1525 05/31/16 0455 05/31/16 1512 06/01/16 0350 06/01/16 1801 06/02/16 0409  ALBUMIN 2.3* 2.0* 2.1* 2.0*  --  2.0*  INR  --   --   --   --  1.32  --      INFECTIOUS No results for input(s): LATICACIDVEN, PROCALCITON in the last 168 hours.   ENDOCRINE CBG (last 3)   Recent Labs  06/01/16 2331 06/02/16 0319 06/02/16 0729  GLUCAP 114* 111* 134*         IMAGING x48h  - image(s) personally visualized  -   highlighted in bold Dg Chest Port 1 View  Result Date: 06/01/2016 CLINICAL DATA:  Respiratory failure. EXAM: PORTABLE CHEST 1 VIEW COMPARISON:  05/29/2016 . FINDINGS: Surgical clips right neck. Tracheostomy tube, feeding tube, right IJ line stable position. Cardiomegaly with diffuse bilateral pulmonary infiltrates and small right pleural effusion again noted consistent with CHF. No significant change. Bilateral pneumonia cannot be excluded. Low lung volumes. IMPRESSION: 1.  Tracheostomy tube, feeding tube, right IJ line stable position. 2. Cardiomegaly with pulmonary venous congestion, persistent bilateral pulmonary infiltrates/ edema, and small right pleural effusion. Findings consistent with persistent CHF. No significant change. 3. Low lung volumes . Electronically Signed   By: Maisie Fus  Register   On: 06/01/2016 07:34      DISCUSSION: 71 year old man, history of fibrillation, obesity, admitted with respiratory failure and septic shock in the  setting of H flu pneumonia. Subsequent course complicated by retroperitoneal bleeding on anticoagulation, hemorrhagic shock, acute respiratory failure and acute renal failure. s/p trach , tolerating ATC daytime, off  CVVHD.  Pressors off  Unfortunately has rt humerus fracture -conservative mx  ASSESSMENT / PLAN:  PULMONARY A: Acute respiratory failure Pneumonia, H influenza (beta lactamase positive) Presumed OSA/OHS History of asthma Bilateral pulm infiltrates / effusions, consistent w volume overload  3/30 - On ATC since 05/30/16. Some scretions aound his trach - not blood  P:   - ATC  tolerated, since 3/27 Continue bronchodilators when necessary Speech eval for PMV once secretions decrease  CARDIOVASCULAR A:  Shock, principally hemorrhagic.  Paroxysmal atrial fibrillation History of hypertension  3/30 -   Norepinephrine off  Off  stress dose steroids  PLAN No plan to restart anticoagulation at this time.  Lopresso rprn for rate control  RENAL I/O last 3 completed shifts: In: 3090 [I.V.:350; Other:100; NG/GT:2540; IV Piggyback:100] Out: 2605 [Urine:2355; Stool:250]  A:   Acute renal failure, presumed due to ATN in the setting of hemorrhagic shock  3/30 - creat rising but making urine P:   Appreciate nephrology assistance off CVVHD -monitor cr & UO, making urine, hopeful for renal recovery, may yet need iHD Goal neg balance  GASTROINTESTINAL A:   Large retroperitoneal bleed noted, some slight increased 3/18 CT scan, hemoglobin currently stable Shock liver, transaminitis, improving Hepatic lesion; noted on CT scan of the abdomen 3/18. 5 cm lesion of the dome of the liver, could represent blood or infection or infarct.  Protein calorie malnutrition   - no evidence of ative bleed P:   Continue PPI [place] not helpful , will need rpt CT to reimage liver lesion  At some point Continue tube feeds Ct  rectal tube . His scrotal edema and skin breakdown of  concern  HEMATOLOGIC  Recent Labs Lab 05/31/16 0447 06/01/16 0350 06/01/16 1801  HGB 7.9* 8.1* 7.7*  HCT 25.0* 25.0* 24.7*  WBC 10.3 7.2 5.4  PLT 71* 74* 74*    A:   Acute blood loss anemia, improved Severe heparin sensitivity, associated with hemorrhage Thrombocytopenia, acute on chronic -came in low at 140k P:  Follow CBC, Goal hemoglobin greater than 7.0 No heparin ever hold anticoagulation  SCD for DVT prophylaxis  INFECTIOUS A:   H. influenzae pneumonia, treated HCAP Septic shock ? Clinical significance hepatic lesion, ? Possible abscess   3/30 - on abx. Not on perssors  P:   Antibiotics for pneumonia were completed ct cefepime, started 3/23,  for cellulitis around stoma & HCAP /secretions - await rpt resp cx, may need 10-14 ds   ENDOCRINE A:   Hyperglycemia, on steroids Hypothyroidism P:   Continue SSI, CBG Continue Synthroid  NEUROLOGIC A:   Toxic metabolic encephalopathy, starting to clear his mental status History of CVA with residual deficits Rt humerus fracture -   3/30 - much improved mental status; communicative and seems orented P:   PT consult - LTAC recommended RUE sling Morphine prn pain   FAMILY DISCUSSIONS:  Full code. Family very upset at outcome, bleeding - see prior notes, NO HEPARIN per their prior experience D/w granddaughter 3/28 POA, does not want LTAC, prefers that he stay at [facility]. No family at bedisde 06/02/2016   - Inter-disciplinary family meet or Palliative Care meeting due by:  done  CODE STATUS Full Code  DISPO Keep in ICU given above issues   The patient is critically ill with multiple organ systems failure and requires high complexity decision making for assessment and support, frequent evaluation and titration of therapies, application of advanced monitoring technologies and extensive interpretation of multiple databases.  Critical Care Time devoted to patient care services described in this note is  30   Minutes. This time reflects time of care of this signee Dr Kalman Shan. This critical care time does not reflect procedure time, or teaching time or supervisory time of PA/NP/Med student/Med Resident etc but could involve care discussion time    Dr. Kalman Shan, M.D., [facility].C.P Pulmonary and Critical Care Medicine Staff Physician [facility] Pager: [phone], If no answer or between  15:00h - 7:00h: call [phone]  06/02/2016 7:57 AM

## 2016-06-03 LAB — GLUCOSE, CAPILLARY
Glucose-Capillary: 111 mg/dL — ABNORMAL HIGH (ref 65–99)
Glucose-Capillary: 120 mg/dL — ABNORMAL HIGH (ref 65–99)
Glucose-Capillary: 113 mg/dL — ABNORMAL HIGH (ref 65–99)
Glucose-Capillary: 128 mg/dL — ABNORMAL HIGH (ref 65–99)
Glucose-Capillary: 118 mg/dL — ABNORMAL HIGH (ref 65–99)

## 2016-06-03 LAB — RENAL FUNCTION PANEL
Albumin: 1.9 g/dL — ABNORMAL LOW (ref 3.5–5.0)
Anion gap: 11 (ref 5–15)
BUN: 158 mg/dL — ABNORMAL HIGH (ref 6–20)
CHLORIDE: 101 mmol/L (ref 101–111)
CO2: 27 mmol/L (ref 22–32)
Calcium: 8.4 mg/dL — ABNORMAL LOW (ref 8.9–10.3)
Creatinine, Ser: 3.96 mg/dL — ABNORMAL HIGH (ref 0.61–1.24)
GFR calc Af Amer: 16 mL/min — ABNORMAL LOW (ref >60)
GFR, EST NON AFRICAN AMERICAN: 14 mL/min — AB (ref >60)
Glucose, Bld: 126 mg/dL — ABNORMAL HIGH (ref 65–99)
POTASSIUM: 4.5 mmol/L (ref 3.5–5.1)
Phosphorus: 4.6 mg/dL (ref 2.5–4.6)
Sodium: 139 mmol/L (ref 135–145)

## 2016-06-03 LAB — CBC
HEMATOCRIT: 23.9 % — AB (ref 39.0–52.0)
Hemoglobin: 7.7 g/dL — ABNORMAL LOW (ref 13.0–17.0)
MCH: 34.5 pg — ABNORMAL HIGH (ref 26.0–34.0)
MCHC: 32.2 g/dL (ref 30.0–36.0)
MCV: 107.2 fL — ABNORMAL HIGH (ref 78.0–100.0)
Platelets: 76 10*3/uL — ABNORMAL LOW (ref 150–400)
RBC: 2.23 MIL/uL — ABNORMAL LOW (ref 4.22–5.81)
WBC: 5.2 10*3/uL (ref 4.0–10.5)

## 2016-06-03 LAB — MAGNESIUM: MAGNESIUM: 2 mg/dL (ref 1.7–2.4)

## 2016-06-03 MED ORDER — SODIUM CHLORIDE 0.9 % IV SOLN
INTRAVENOUS | Status: DC
  Administered 2016-06-05: 09:00:00 via INTRAVENOUS
  Administered 2016-06-20: 10 mL/h via INTRAVENOUS

## 2016-06-03 NOTE — Progress Notes (Signed)
Titrated to 6L, FIO2 28%, sat 100%.

## 2016-06-03 NOTE — Progress Notes (Signed)
Patient ID: Omar [name], male   DOB: Oct 14, 1945, 71 y.o.   MRN: [id number]  [facility] Progress Note   Subjective:    Off CRRT since 3/27 Making more urine (no lasix) 1.6 liters past 24 hours Creatinine 2.9->3.1-> 3.7->3.9 , BUN 158 so has not leveled off yet as I had hoped Up in the chair today - alert - denies SOB or nausea   Objective:   BP (!) 110/57   Pulse (!) 119   Temp 99.5 F (37.5 C) (Oral)   Resp (!) 24   Ht '5\' 6"'  (1.676 m)   Wt (!) 148.8 kg (328 lb)   SpO2 98%   BMI 52.94 kg/m   Intake/Output Summary (Last 24 hours) at 06/03/16 1017 Last data filed at 06/03/16 0800  Gross per 24 hour  Intake             1810 ml  Output             1650 ml  Net              160 ml   Weight change: -1.814 kg (-4 lb)  Physical Exam: VS as noted CVP 8-9 Trach/L central line/foley  Right IJ vascath (replaced 3/23) Mentally seems a lot flatter today, responds to questions but not as animated as yesterday Pulse regular rhythm, HR 110, S1 and S2 normal Diminished breath sounds bilaterally-no distinct rales or rhonchi Junky secretions - yellow - from trach Soft, obese, nontender 2-3+ lower extremity woody edema, persistent scrotal edema, pitting edema abdominal wall all about same Woody and chronic skin changes legs Urine in foley still clear  Labs:  Recent Labs Lab 05/30/16 0517 05/30/16 1525 05/31/16 0447 05/31/16 0455 05/31/16 1512 06/01/16 0350 06/02/16 0409 06/03/16 0305  NA 136 135 136 135 136 136 138 139  K 3.9 4.3 4.0 4.0 4.2 3.8 4.1 4.5  CL 100* 99* 101 100* 101 101 101 101  CO2 '27 26 27 26 26 26 26 27  ' GLUCOSE 136* 113* 127* 121* 138* 138* 127* 126*  BUN 50* 61* 80* 81* 95* 110* 139* 158*  CREATININE 1.49* 1.84* 2.52* 2.54* 2.90* 3.15* 3.73* 3.96*  CALCIUM 8.7* 8.9 8.4* 8.4* 8.5* 8.5* 8.3* 8.4*  PHOS 2.5 2.9  --  2.9 3.2 3.6 3.8 4.6     Recent Labs Lab 05/31/16 0447 06/01/16 0350 06/01/16 1801 06/03/16 0305  WBC 10.3 7.2 5.4  5.2  NEUTROABS  --   --  4.4  --   HGB 7.9* 8.1* 7.7* 7.7*  HCT 25.0* 25.0* 24.7* 23.9*  MCV 106.8* 105.5* 106.9* 107.2*  PLT 71* 74* 74* 76*    Medications:    . sodium chloride   Intravenous Once  . ceFEPime (MAXIPIME) IV  1 g Intravenous Q24H  . chlorhexidine gluconate (MEDLINE KIT)  15 mL Mouth Rinse BID  . Chlorhexidine Gluconate Cloth  6 each Topical Daily  . darbepoetin (ARANESP) injection - NON-DIALYSIS  150 mcg Subcutaneous Q Mon-1800  . feeding supplement (PRO-STAT SUGAR FREE 64)  30 mL Per Tube BID  . insulin aspart  0-9 Units Subcutaneous Q4H  . levothyroxine  125 mcg Per Tube QAC breakfast  . mouth rinse  15 mL Mouth Rinse QID  . pantoprazole sodium  40 mg Per Tube Daily  . polyethylene glycol  17 g Oral Daily   . sodium chloride 10 mL/hr at 06/03/16 0600  . feeding supplement (VITAL AF 1.2 CAL) 1,000 mL (06/03/16 0600)  Assessment/ Plan:    HTN, MO, h/o CVA, PAF, chronic anticoagulation, transferred from [facility] 3/5 w/AMS, aspiration, HFlu PNA, sepsis, hypoxic/hypercarbic resp failure (trach), hemorrhagic shock 2/2 RP hematoma, consequent anuric AKI w/mass effect on R kidney displaced medially by hematoma. CRRT 3/12-3/21, 3/23 to present. Max weight prior to CRRT 171 kg 3/13 and down all told frim 171-> 153.8 as of 3/27, but still with volume overload. CRRT held again 3/27.   1. Acute kidney injury: Baseline kidney function 1.1-1.3.   1. ATN from hemorrhagic shock/sepsis.  2. CRRT from 3/12-3/21; 3/23-3/27    3. Weight down from max of 171 kg to nadir weight of 147. Today 148.8  4. Still ECFV overload  5. Non-oliguric 6. Needs dialysis today for azotemia/clearance purposes. I think may be a little uremic.    2. Hemorrhagic shock from acute retroperitoneal bleed:  All pressors off currrently. Hb stable 7's  3. Acute blood loss anemia secondary to retroperitoneal hemorrhage: Total 7 units (+ FFP/plts) since adm . Aranesp added (150 QMonday. Iron stores satisfactory  w/tsat of 51  4. Elytes- K today fine, phos OK   5. VDRF- s/p trach 3/21. Per CCM   6. HFlu PNA. ATB's per CCM.  Of note: Random vancomycin before vanco stopped was 37 -- so at risk for additional renal toxicity from vanco -  discontinued but will likely hang around for a while. Remains on cefipime  7. Thrombocytopenia "acute on chronic" but persistent. Not getting any heparin. Plts stable 70's.   Jamal Maes, MD [facility] [phone] Pager 06/03/2016, 10:17 AM

## 2016-06-03 NOTE — Progress Notes (Signed)
PCCM Progress Note  Admission date: 05/07/2016 Referring provider: Dr. Clarene Duke, [facility] ER  CC: altered mental status  HPI: 71 y/o male from [facility] admitted 3/5 with altered mental status with aspiration, sepsis, hypoxic/hypercapnic respiratory failure. Course c/b retroperitoneal bleed on heparin , shock, acute renal failure & prolonged ventilation requiring tracheostomy    has a past medical history of Asthma; Atrial fibrillation (HCC); Gout; Hypertension; Morbid obesity (HCC); Multinodular goiter; Osteoarthrosis and allied disorders; Other and unspecified hyperlipidemia; Stroke [facility]); Thrombocytopenia (HCC); and Vitamin D deficiency.   Antibiotics: Zosyn 3/06 >> 3/08 Rocephin (H.Flu PNA) 3/08 >> 3/14 Vancomycin 3/23 >> 3/28 Cefepime 3/23 >>   Cultures: Urine 3/05 >> negative Blood 3/06 >> negative Resp 3/6 >> H flu (b-lactamase +) Trach stoma site 3/23 >> diphtheroids resp cx 3/28 >>nl flora   Lines/tubes: ETT 3/05 >> 3/8 ETT 3/10 >> 3/21 Trach (Dr Jenne Pane) 3/21 >>  Lt IJ CVL 3/07 >> 3/27 R HD catheter  >> 3/22 R IJ HD catheter 3/23 >>   Events: 3/05  Transfer to [facility] Sputum 3/06 >> Haemophilus influenzae (beta lactamase positive) Echo 3/06 >> EF 65 to 70%, mod LVH, mod TR, PAS 39 mmHg CT head 3/7 >> no acute abnormality  3/27 rt humerus fracture identified - ortho consult 3/27 RUQ [place] .Marland Kitchen Not helpful due to body habitus 3/29 - Chronic  critically ill On ATC since 3/27  with copious secretions c/o RUE pain decreased Liquid stool   SUBJECTIVE/OVERNIGHT/INTERVAL HX 3/30 - doing ATC x 24-48h per RN. Rt shoulder pain only when touched per RN. Making urine. Off CRRT. Not on pressors. Family not fully on board about LTAC per RN but looking at [facility]. Needs LTAC.  Vital signs: BP 112/64   Pulse (!) 115   Temp 97.6 F (36.4 C) (Oral)   Resp (!) 24   Ht  (1.676 m)   Wt (!) 148.8 kg (328 lb)   SpO2 100%   BMI 52.94 kg/m   Intake/output:  Intake/Output Summary (Last 24  hours) at 06/03/16 1711 Last data filed at 06/03/16 1600  Gross per 24 hour  Intake             1890 ml  Output             1700 ml  Net              190 ml   I/O last 3 completed shifts: In: 3260 [I.V.:360; Other:100; NG/GT:2700; IV Piggyback:100] Out: 2965 [Urine:2565; Other:150; Stool:250]  EXAM  General Appearance:    Looks chronic criticall ill OBESE - YES  Head:    Normocephalic, without obvious abnormality, atraumatic  Eyes:    PERRL - yes, conjunctiva/corneas - ues      Ears:    Normal external ear canals, both ears  Nose:   NG tube - CORTRAK +  Throat:  ETT TUBE - No but HAS TRACH + , OG tube - no  Neck:   Supple,  No enlargement/tenderness/nodules, no neck     Lungs:     Clear to auscultation bilaterally, On ATC cuff down on trach  Chest wall:    No deformity  Heart:    S1 and S2 normal, no murmur, CVP - no.  Pressors - no  Abdomen:     Soft, no masses, no organomegaly  Genitalia:    Not done  Rectal:   not done  Extremities:   Extremities- obese with chronic edema. Rt shoulder in sling  Skin:   Intact in exposed areas . Sacral area - no decuib reported per RN     Neurologic:   Sedation - none -> RASS - +1 . Moves all 4s - yes. CAM-ICU - neg . Orientation - seems appropriate       LABS  PULMONARY No results for input(s): PHART, PCO2ART, PO2ART, HCO3, TCO2, O2SAT in the last 168 hours.  Invalid input(s): PCO2, PO2  CBC  Recent Labs Lab 06/01/16 0350 06/01/16 1801 06/03/16 0305  HGB 8.1* 7.7* 7.7*  HCT 25.0* 24.7* 23.9*  WBC 7.2 5.4 5.2  PLT 74* 74* 76*    COAGULATION  Recent Labs Lab 06/01/16 1801  INR 1.32    CARDIAC  No results for input(s): TROPONINI in the last 168 hours. No results for input(s): PROBNP in the last 168 hours.   CHEMISTRY  Recent Labs Lab 05/30/16 0517  05/31/16 0447 05/31/16 0455 05/31/16 1512 06/01/16 0350 06/02/16 0409 06/03/16 0305  NA 136  < > 136 135 136 136 138 139  K 3.9  < > 4.0 4.0 4.2 3.8 4.1  4.5  CL 100*  < > 101 100* 101 101 101 101  CO2 27  < > GLUCOSE 136*  < > 127* 121* 138* 138* 127* 126*  BUN 50*  < > 80* 81* 95* 110* 139* 158*  CREATININE 1.49*  < > 2.52* 2.54* 2.90* 3.15* 3.73* 3.96*  CALCIUM 8.7*  < > 8.4* 8.4* 8.5* 8.5* 8.3* 8.4*  MG 2.2  --  2.0  --   --  2.1 1.9 2.0  PHOS 2.5  < >  --  2.9 3.2 3.6 3.8 4.6  < > = values in this interval not displayed. Estimated Creatinine Clearance: 23.3 mL/min (A) (by C-G formula based on SCr of 3.96 mg/dL (H)).   LIVER  Recent Labs Lab 05/31/16 0455 05/31/16 1512 06/01/16 0350 06/01/16 1801 06/02/16 0409 06/03/16 0305  ALBUMIN 2.0* 2.1* 2.0*  --  2.0* 1.9*  INR  --   --   --  1.32  --   --      INFECTIOUS No results for input(s): LATICACIDVEN, PROCALCITON in the last 168 hours.   ENDOCRINE CBG (last 3)   Recent Labs  06/03/16 0734 06/03/16 1141 06/03/16 1545  GLUCAP 120* 113* 128*         IMAGING x48h  - image(s) personally visualized  -   highlighted in bold No results found.    DISCUSSION: 71 year old man, history of fibrillation, obesity, admitted with respiratory failure and septic shock in the setting of H flu pneumonia. Subsequent course complicated by retroperitoneal bleeding on anticoagulation, hemorrhagic shock, acute respiratory failure and acute renal failure. s/p trach , tolerating ATC daytime, off CVVHD.  Pressors off  Unfortunately has rt humerus fracture -conservative mx  ASSESSMENT / PLAN:  PULMONARY A: Acute respiratory failure Pneumonia, H influenza (beta lactamase positive) Presumed OSA/OHS History of asthma Bilateral pulm infiltrates / effusions, consistent w volume overload  3/30 - On ATC since 05/30/16. Some scretions aound his trach - not blood  P:   - ATC  tolerated, since 3/27 Continue bronchodilators when necessary Speech eval for PMV once secretions decrease  CARDIOVASCULAR A:  Shock, principally hemorrhagic.  Paroxysmal atrial  fibrillation History of hypertension  3/30 -   Norepinephrine off  Off  stress dose steroids  PLAN No plan to restart anticoagulation at this time.  Lopresso rprn for rate control  RENAL  I/O last 3 completed shifts: In: 3260 [I.V.:360; Other:100; NG/GT:2700; IV Piggyback:100] Out: 2965 [Urine:2565; Other:150; Stool:250] Lab Results  Component Value Date   CREATININE 3.96 (H) 06/03/2016   CREATININE 3.73 (H) 06/02/2016   CREATININE 3.15 (H) 06/01/2016   CREATININE 0.80 07/16/2012    A:   Acute renal failure, presumed due to ATN in the setting of hemorrhagic shock  3/30 - creat rising but making urine P:   Appreciate nephrology assistance off CVVHD -monitor cr & UO, making urine, hopeful for renal recovery, may yet need iHD Goal neg balance  GASTROINTESTINAL A:   Large retroperitoneal bleed noted, some slight increased 3/18 CT scan, hemoglobin currently stable Shock liver, transaminitis, improving Hepatic lesion; noted on CT scan of the abdomen 3/18. 5 cm lesion of the dome of the liver, could represent blood or infection or infarct.  Protein calorie malnutrition   - no evidence of ative bleed P:   Continue PPI [place] not helpful , will need rpt CT to reimage liver lesion  At some point Continue tube feeds Ct  rectal tube . His scrotal edema and skin breakdown of concern  HEMATOLOGIC  Recent Labs Lab 06/01/16 0350 06/01/16 1801 06/03/16 0305  HGB 8.1* 7.7* 7.7*  HCT 25.0* 24.7* 23.9*  WBC 7.2 5.4 5.2  PLT 74* 74* 76*    A:   Acute blood loss anemia, improved Severe heparin sensitivity, associated with hemorrhage Thrombocytopenia, acute on chronic -came in low at 140k P:  Follow CBC, Goal hemoglobin greater than 7.0 No heparin ever hold anticoagulation  SCD for DVT prophylaxis  INFECTIOUS A:   H. influenzae pneumonia, treated HCAP Septic shock ? Clinical significance hepatic lesion, ? Possible abscess   3/30 - on abx. Not on pressors  P:    Antibiotics for pneumonia were completed ct cefepime, started 3/23,  for cellulitis around stoma & HCAP /secretions - resp culture nl flora cx, may need 10-14 ds   ENDOCRINE CBG (last 3)   Recent Labs  06/03/16 0734 06/03/16 1141 06/03/16 1545  GLUCAP 120* 113* 128*     A:   Hyperglycemia, on steroids Hypothyroidism P:   Continue SSI, CBG Continue Synthroid  NEUROLOGIC A:   Toxic metabolic encephalopathy, starting to clear his mental status History of CVA with residual deficits Rt humerus fracture -   3/31 - follows comands P:   PT consult - LTAC recommended RUE sling Morphine prn pain   FAMILY DISCUSSIONS:  Full code. Family very upset at outcome, bleeding - see prior notes, NO HEPARIN per their prior experience D/w granddaughter 3/28 POA, does not want LTAC, prefers that he stay at [facility]. No family at bedisde 06/03/2016. Needs LTAC   - Inter-disciplinary family meet or Palliative Care meeting due by:  done  CODE STATUS Full Code  DISPO Keep in ICU given above issues   App cct 30 min   Brett Canales Deagan Sevin ACNP [facility] PCCM Pager [phone] till 3 pm If no answer page [phone] 06/03/2016, 5:11 PM

## 2016-06-04 ENCOUNTER — Inpatient Hospital Stay (HOSPITAL_COMMUNITY): Payer: Medicare Other

## 2016-06-04 LAB — BLOOD GAS, ARTERIAL
Acid-Base Excess: 2.6 mmol/L — ABNORMAL HIGH (ref 0.0–2.0)
Bicarbonate: 26.9 mmol/L (ref 20.0–28.0)
Drawn by: 441371
FIO2: 0.4
LHR: 12 {breaths}/min
MECHVT: 530 mL
O2 SAT: 96.5 %
PCO2 ART: 46.5 mmHg (ref 32.0–48.0)
PEEP/CPAP: 5 cmH2O
PO2 ART: 89.2 mmHg (ref 83.0–108.0)
Patient temperature: 100.8
pH, Arterial: 7.387 (ref 7.350–7.450)

## 2016-06-04 LAB — GLUCOSE, CAPILLARY
GLUCOSE-CAPILLARY: 102 mg/dL — AB (ref 65–99)
GLUCOSE-CAPILLARY: 119 mg/dL — AB (ref 65–99)
GLUCOSE-CAPILLARY: 127 mg/dL — AB (ref 65–99)
Glucose-Capillary: 129 mg/dL — ABNORMAL HIGH (ref 65–99)
Glucose-Capillary: 123 mg/dL — ABNORMAL HIGH (ref 65–99)
Glucose-Capillary: 103 mg/dL — ABNORMAL HIGH (ref 65–99)
Glucose-Capillary: 126 mg/dL — ABNORMAL HIGH (ref 65–99)

## 2016-06-04 LAB — BASIC METABOLIC PANEL
Anion gap: 12 (ref 5–15)
BUN: 121 mg/dL — AB (ref 6–20)
CHLORIDE: 98 mmol/L — AB (ref 101–111)
CO2: 26 mmol/L (ref 22–32)
Calcium: 8.2 mg/dL — ABNORMAL LOW (ref 8.9–10.3)
Creatinine, Ser: 3.65 mg/dL — ABNORMAL HIGH (ref 0.61–1.24)
GFR calc Af Amer: 18 mL/min — ABNORMAL LOW (ref >60)
GFR calc non Af Amer: 15 mL/min — ABNORMAL LOW (ref >60)
GLUCOSE: 146 mg/dL — AB (ref 65–99)
Potassium: 4.4 mmol/L (ref 3.5–5.1)
SODIUM: 136 mmol/L (ref 135–145)

## 2016-06-04 LAB — CBC WITH DIFFERENTIAL/PLATELET
Basophils Absolute: 0 10*3/uL (ref 0.0–0.1)
Basophils Relative: 0 %
EOS ABS: 0.3 10*3/uL (ref 0.0–0.7)
EOS PCT: 4 %
HCT: 24.6 % — ABNORMAL LOW (ref 39.0–52.0)
Hemoglobin: 7.9 g/dL — ABNORMAL LOW (ref 13.0–17.0)
LYMPHS ABS: 0.5 10*3/uL — AB (ref 0.7–4.0)
LYMPHS PCT: 8 %
MCH: 33.6 pg (ref 26.0–34.0)
MCHC: 32.1 g/dL (ref 30.0–36.0)
MCV: 104.7 fL — AB (ref 78.0–100.0)
MONO ABS: 0.8 10*3/uL (ref 0.1–1.0)
MONOS PCT: 12 %
Neutro Abs: 5.1 10*3/uL (ref 1.7–7.7)
Neutrophils Relative %: 77 %
PLATELETS: 76 10*3/uL — AB (ref 150–400)
RBC: 2.35 MIL/uL — ABNORMAL LOW (ref 4.22–5.81)
WBC: 6.6 10*3/uL (ref 4.0–10.5)

## 2016-06-04 LAB — RENAL FUNCTION PANEL
ALBUMIN: 1.9 g/dL — AB (ref 3.5–5.0)
Anion gap: 9 (ref 5–15)
BUN: 96 mg/dL — AB (ref 6–20)
CALCIUM: 7.8 mg/dL — AB (ref 8.9–10.3)
CO2: 29 mmol/L (ref 22–32)
CREATININE: 2.77 mg/dL — AB (ref 0.61–1.24)
Chloride: 97 mmol/L — ABNORMAL LOW (ref 101–111)
GFR calc Af Amer: 25 mL/min — ABNORMAL LOW (ref >60)
GFR calc non Af Amer: 21 mL/min — ABNORMAL LOW (ref >60)
GLUCOSE: 126 mg/dL — AB (ref 65–99)
PHOSPHORUS: 3.4 mg/dL (ref 2.5–4.6)
Potassium: 3.6 mmol/L (ref 3.5–5.1)
SODIUM: 135 mmol/L (ref 135–145)

## 2016-06-04 LAB — PROCALCITONIN: Procalcitonin: 6.13 ng/mL

## 2016-06-04 LAB — CBC
HCT: 22.4 % — ABNORMAL LOW (ref 39.0–52.0)
HCT: 21.2 % — ABNORMAL LOW (ref 39.0–52.0)
Hemoglobin: 7.2 g/dL — ABNORMAL LOW (ref 13.0–17.0)
Hemoglobin: 6.7 g/dL — CL (ref 13.0–17.0)
MCH: 34.8 pg — AB (ref 26.0–34.0)
MCH: 34.2 pg — ABNORMAL HIGH (ref 26.0–34.0)
MCHC: 32.1 g/dL (ref 30.0–36.0)
MCHC: 31.6 g/dL (ref 30.0–36.0)
MCV: 108.2 fL — ABNORMAL HIGH (ref 78.0–100.0)
MCV: 108.2 fL — ABNORMAL HIGH (ref 78.0–100.0)
PLATELETS: 64 10*3/uL — AB (ref 150–400)
Platelets: 64 10*3/uL — ABNORMAL LOW (ref 150–400)
RBC: 2.07 MIL/uL — ABNORMAL LOW (ref 4.22–5.81)
RBC: 1.96 MIL/uL — ABNORMAL LOW (ref 4.22–5.81)
WBC: 5.2 10*3/uL (ref 4.0–10.5)
WBC: 5 10*3/uL (ref 4.0–10.5)

## 2016-06-04 LAB — MAGNESIUM: MAGNESIUM: 1.8 mg/dL (ref 1.7–2.4)

## 2016-06-04 LAB — PREPARE RBC (CROSSMATCH)

## 2016-06-04 LAB — LACTIC ACID, PLASMA: Lactic Acid, Venous: 1.3 mmol/L (ref 0.5–1.9)

## 2016-06-04 MED ORDER — VANCOMYCIN HCL 10 G IV SOLR
2000.0000 mg | Freq: Once | INTRAVENOUS | Status: AC
  Administered 2016-06-04: 2000 mg via INTRAVENOUS
  Filled 2016-06-04: qty 2000

## 2016-06-04 MED ORDER — SODIUM CHLORIDE 0.9 % IV SOLN: Freq: Once | INTRAVENOUS | Status: AC

## 2016-06-04 MED ORDER — SODIUM CHLORIDE 0.9 % IV BOLUS (SEPSIS)
1000.0000 mL | Freq: Once | INTRAVENOUS | Status: AC
  Administered 2016-06-04: 1000 mL via INTRAVENOUS

## 2016-06-04 MED ORDER — SODIUM CHLORIDE 0.9 % IV SOLN
500.0000 mg | INTRAVENOUS | Status: DC
  Administered 2016-06-04: 500 mg via INTRAVENOUS
  Filled 2016-06-04 (×2): qty 0.5

## 2016-06-04 MED ORDER — ANTICOAGULANT SODIUM CITRATE 4% (200MG/5ML) IV SOLN
5.0000 mL | Freq: Once | Status: AC
  Administered 2016-06-04: 5 mL via INTRAVENOUS
  Filled 2016-06-04: qty 250

## 2016-06-04 MED ORDER — MAGNESIUM SULFATE 2 GM/50ML IV SOLN
2.0000 g | Freq: Once | INTRAVENOUS | Status: AC
  Administered 2016-06-04: 2 g via INTRAVENOUS
  Filled 2016-06-04: qty 50

## 2016-06-04 MED ORDER — IPRATROPIUM-ALBUTEROL 0.5-2.5 (3) MG/3ML IN SOLN
3.0000 mL | Freq: Four times a day (QID) | RESPIRATORY_TRACT | Status: DC
Start: 2016-06-04 — End: 2016-06-09
  Administered 2016-06-04 – 2016-06-09 (×21): 3 mL via RESPIRATORY_TRACT
  Filled 2016-06-04 (×21): qty 3

## 2016-06-04 MED ORDER — NALOXONE HCL 0.4 MG/ML IJ SOLN: 0.4000 mg | INTRAMUSCULAR | Status: DC | PRN

## 2016-06-04 MED ORDER — NOREPINEPHRINE BITARTRATE 1 MG/ML IV SOLN
0.0000 ug/min | INTRAVENOUS | Status: DC
  Administered 2016-06-04: 12 ug/min via INTRAVENOUS
  Administered 2016-06-04: 18 ug/min via INTRAVENOUS
  Administered 2016-06-04: 2 ug/min via INTRAVENOUS
  Administered 2016-06-05: 12 ug/min via INTRAVENOUS
  Filled 2016-06-04 (×3): qty 4

## 2016-06-04 NOTE — Progress Notes (Signed)
Patient taken to CT.  Patient back on the unit.  Daughter called and updated.

## 2016-06-04 NOTE — Progress Notes (Signed)
RN notified of pt temp of 100.8 F orally.  

## 2016-06-04 NOTE — Progress Notes (Addendum)
Patient ID: Omar [name], male   DOB: [birth year], 71 y.o.   MRN: [id number]  [facility] Progress Note   Subjective:    Off CRRT since 3/27 Had HD yesterday for clearance Making some urine Renal recovery uncertain   Objective:   BP (!) 97/48   Pulse (!) 110   Temp 100 F (37.8 C) (Oral)   Resp (!) 23   Ht _0  (1.676 m)   Wt (!) 146.5 kg (323 lb)   SpO2 94%   BMI 52.13 kg/m   Intake/Output Summary (Last 24 hours) at 06/04/16 1016 Last data filed at 06/04/16 0700  Gross per 24 hour  Intake             1835 ml  Output              701 ml  Net             1134 ml   Weight change: -2.268 kg (-5 lb)  Physical Exam: VS as noted Trach/L central line/foley  Right IJ vascath (replaced 3/23) Sleepy this AM but appropriate response to questions Cardiac regular rhythm, HR 110, S1 and S2 normal Coarse breath sounds, trach secretions junky  Soft, obese, nontender 2-3+ lower extremity woody edema, persistent scrotal edema, pitting edema abdominal wall all about same Woody and chronic skin changes legs Foley with dark yellow urine   Recent Labs Lab 05/30/16 1525 05/31/16 0447 05/31/16 0455 05/31/16 1512 06/01/16 0350 06/02/16 0409 06/03/16 0305 06/04/16 0442  NA 135 136 135 136 136 138 139 135  K 4.3 4.0 4.0 4.2 3.8 4.1 4.5 3.6  CL 99* 101 100* 101 101 101 101 97*  CO2 _1 GLUCOSE 113* 127* 121* 138* 138* 127* 126* 126*  BUN 61* 80* 81* 95* 110* 139* 158* 96*  CREATININE 1.84* 2.52* 2.54* 2.90* 3.15* 3.73* 3.96* 2.77*  CALCIUM 8.9 8.4* 8.4* 8.5* 8.5* 8.3* 8.4* 7.8*  PHOS 2.9  --  2.9 3.2 3.6 3.8 4.6 3.4     Recent Labs Lab 06/01/16 0350 06/01/16 1801 06/03/16 0305 06/04/16 0442  WBC 7.2 5.4 5.2 5.2  NEUTROABS  --  4.4  --   --   HGB 8.1* 7.7* 7.7* 7.2*  HCT 25.0* 24.7* 23.9* 22.4*  MCV 105.5* 106.9* 107.2* 108.2*  PLT 74* 74* 76* 64*    Medications:    . ceFEPime (MAXIPIME) IV  1 g Intravenous Q24H  .  chlorhexidine gluconate (MEDLINE KIT)  15 mL Mouth Rinse BID  . Chlorhexidine Gluconate Cloth  6 each Topical Daily  . darbepoetin (ARANESP) injection - NON-DIALYSIS  150 mcg Subcutaneous Q Mon-1800  . feeding supplement (PRO-STAT SUGAR FREE 64)  30 mL Per Tube BID  . insulin aspart  0-9 Units Subcutaneous Q4H  . ipratropium-albuterol  3 mL Nebulization Q6H  . levothyroxine  125 mcg Per Tube QAC breakfast  . mouth rinse  15 mL Mouth Rinse QID  . pantoprazole sodium  40 mg Per Tube Daily  . polyethylene glycol  17 g Oral Daily   . sodium chloride 10 mL/hr at 06/03/16 2000  . sodium chloride 10 mL/hr at 06/04/16 0400  . feeding supplement (VITAL AF 1.2 CAL) 1,000 mL (06/04/16 0400)    Assessment/ Plan:    Summary: HTN, MO, h/o CVA, PAF, chronic anticoagulation, transferred from [facility] 3/5 w/AMS, aspiration, HFlu PNA, sepsis, hypoxic/hypercarbic resp failure (trach), hemorrhagic shock 2/2 RP hematoma, consequent anuric AKI w/mass  effect on R kidney displaced medially by hematoma. CRRT 3/12-3/21, 3/27. Max weight prior to CRRT 171 kg 3/13 and down all told frim 171-> 153.8 as of 3/27, but still with volume overload. CRRT held again 3/27.   1. Acute kidney injury: Baseline kidney function 1.1-1.3.  (1.04 on admission 05/09/17) with ATN from hemorrhagic shock/sepsis.  1. CRRT from 3/12-3/21; 3/23-3/27. HD 3/31.  2. Weight down from max of 171 kg to nadir weight of 147. Today 148.8  3. Renal recovery prognosis uncertain - could end up chronic  4. Reassess for HD AM  2. Hemorrhagic shock from acute retroperitoneal bleed:  All pressors off currrently. Hb stable 7's  3. Acute blood loss anemia secondary to retroperitoneal hemorrhage: Total 7 units (+ FFP/plts) since adm . Aranesp added (150 QMonday. Iron stores satisfactory w/tsat of 51  4. Elytes- K today fine, phos OK   5. VDRF- s/p trach 3/21. Per CCM   6. HFlu PNA. ATB's per CCM.  Of note: Random vancomycin before vanco stopped was 37 -- so  at risk for additional renal toxicity from vanco -  discontinued but will likely hang around for a while. Remains on cefipime  7. Thrombocytopenia "acute on chronic" but persistent. Not getting any heparin. Plts stable 70's.   Jamal Maes, MD [place] Pager 06/04/2016, 10:16 AM

## 2016-06-04 NOTE — Progress Notes (Signed)
Patient seems to be having worsening mental status and right sided gaze which seems to be new per nursing staff. He is less responsive compared to last few nights. He has not been on anticoag due to recent RP bleed. We are concerned about a CVA with his hx of Afib and this new worsening Mental status and right sided gaze. To note, he does have hx of old CVA with right sided weakness from that previous CVA but the rightward gaze is new.  - will order stat head CT to rule out hemorrhage or ischemia.  - The AMS could also be from a new infection, he is on broad spectrum abx to cover.

## 2016-06-04 NOTE — Progress Notes (Signed)
SLP Cancellation Note  Patient Details Name: Omar [name] MRN: [id number] DOB: 05-Dec-1945   Cancelled treatment:  Pt back on vent due to sepsis syndrome.  Was having difficulty tolerating PMV last week due to secretions, size of trach.  Will continue to follow.          Blenda Mounts [name] 06/04/2016, 3:52 PM

## 2016-06-04 NOTE — Progress Notes (Signed)
PCCM Progress Note  Admission date: 05/30/2016 Referring provider: Dr. Clarene Duke, [facility] ER  CC: altered mental status  HPI: 71 y/o male from [facility] admitted 3/5 with altered mental status with aspiration, sepsis, hypoxic/hypercapnic respiratory failure. Course c/b retroperitoneal bleed on heparin , shock, acute renal failure & prolonged ventilation requiring tracheostomy    has a past medical history of Asthma; Atrial fibrillation (HCC); Gout; Hypertension; Morbid obesity (HCC); Multinodular goiter; Osteoarthrosis and allied disorders; Other and unspecified hyperlipidemia; Stroke [facility]); Thrombocytopenia (HCC); and Vitamin D deficiency.   Antibiotics: Zosyn 3/06 >> 3/08 Rocephin (H.Flu PNA) 3/08 >> 3/14 Vancomycin 3/23 >> 3/28 Cefepime 3/23 >>   Cultures: Urine 3/05 >> negative Blood 3/06 >> negative Resp 3/6 >> H flu (b-lactamase +) Trach stoma site 3/23 >> diphtheroids resp cx 3/28 >>nl flora   Lines/tubes: ETT 3/05 >> 3/8 ETT 3/10 >> 3/21 Trach (Dr Jenne Pane) 3/21 >>  Lt IJ CVL 3/07 >> 3/27 R HD catheter  >> 3/22 R IJ HD catheter 3/23 >>   Events: 3/05  Transfer to [facility] Sputum 3/06 >> Haemophilus influenzae (beta lactamase positive) Echo 3/06 >> EF 65 to 70%, mod LVH, mod TR, PAS 39 mmHg CT head 3/7 >> no acute abnormality  3/27 rt humerus fracture identified - ortho consult 3/27 RUQ [place] .Marland Kitchen Not helpful due to body habitus 3/29 - Chronic  critically ill On ATC since 3/27  with copious secretions c/o RUE pain decreased Liquid stool  3/30 - doing ATC x 24-48h per RN. Rt shoulder pain only when touched per RN. Making urine. Off CRRT. Not on pressors. Family not fully on board about LTAC per RN but looking at [facility]. Needs LTAC.    SUBJECTIVE/OVERNIGHT/INTERVAL HX 4/1-18 - RN and RT c/ow febrile more tired. Had HD yesterday. FEver 101F. Increased et tube scretions with change in color  Vital signs: BP (!) 97/48   Pulse (!) 110   Temp (!) 100.8 F (38.2 C) (Oral)   Resp (!) 23    Ht  (1.676 m)   Wt (!) 146.5 kg (323 lb)   SpO2 94%   BMI 52.13 kg/m   Intake/output:  Intake/Output Summary (Last 24 hours) at 06/04/16 1227 Last data filed at 06/04/16 0700  Gross per 24 hour  Intake             1675 ml  Output              551 ml  Net             1124 ml   I/O last 3 completed shifts: In: 3085 [I.V.:355; NG/GT:2630; IV Piggyback:100] Out: 1901 [Urine:1601; Other:150; Stool:150]  EXAM  General Appearance:    Looks criticall ill OBESE - yes  Head:    Normocephalic, without obvious abnormality, atraumatic  Eyes:    PERRL - yes, conjunctiva/corneas - clear      Ears:    Normal external ear canals, both ears  Nose:   NG tube - yes  Throat:  ETT TUBE - TRACH +  , OG tube - no  Neck:   Supple,  No enlargement/tenderness/nodules     Lungs:     Junky soounding lungs  Chest wall:    No deformity  Heart:    S1 and S2 normal, no murmur, CVP - no.  Pressors - no  Abdomen:     Soft, no masses, no organomegaly  Genitalia:    Not done  Rectal:   not done  Extremities:  Extremities- mild edema     Skin:   Intact in exposed areas . Sacral area - no reportes of decub     Neurologic:   Sedation - none -> RASS - -2 equivalent and drowsy        LABS  PULMONARY No results for input(s): PHART, PCO2ART, PO2ART, HCO3, TCO2, O2SAT in the last 168 hours.  Invalid input(s): PCO2, PO2  CBC  Recent Labs Lab 06/01/16 1801 06/03/16 0305 06/04/16 0442  HGB 7.7* 7.7* 7.2*  HCT 24.7* 23.9* 22.4*  WBC 5.4 5.2 5.2  PLT 74* 76* 64*    COAGULATION  Recent Labs Lab 06/01/16 1801  INR 1.32    CARDIAC  No results for input(s): TROPONINI in the last 168 hours. No results for input(s): PROBNP in the last 168 hours.   CHEMISTRY  Recent Labs Lab 05/31/16 0447  05/31/16 1512 06/01/16 0350 06/02/16 0409 06/03/16 0305 06/04/16 0442  NA 136  < > 136 136 138 139 135  K 4.0  < > 4.2 3.8 4.1 4.5 3.6  CL 101  < > 101 101 101 101 97*  CO2 27  < > GLUCOSE 127*  < > 138* 138* 127* 126* 126*  BUN 80*  < > 95* 110* 139* 158* 96*  CREATININE 2.52*  < > 2.90* 3.15* 3.73* 3.96* 2.77*  CALCIUM 8.4*  < > 8.5* 8.5* 8.3* 8.4* 7.8*  MG 2.0  --   --  2.1 1.9 2.0 1.8  PHOS  --   < > 3.2 3.6 3.8 4.6 3.4  < > = values in this interval not displayed. Estimated Creatinine Clearance: 33 mL/min (A) (by C-G formula based on SCr of 2.77 mg/dL (H)).   LIVER  Recent Labs Lab 05/31/16 1512 06/01/16 0350 06/01/16 1801 06/02/16 0409 06/03/16 0305 06/04/16 0442  ALBUMIN 2.1* 2.0*  --  2.0* 1.9* 1.9*  INR  --   --  1.32  --   --   --      INFECTIOUS No results for input(s): LATICACIDVEN, PROCALCITON in the last 168 hours.   ENDOCRINE CBG (last 3)   Recent Labs  06/04/16 0430 06/04/16 0725 06/04/16 1145  GLUCAP 119* 129* 123*         IMAGING x48h  - image(s) personally visualized  -   highlighted in bold No results found.    DISCUSSION: 71 year old man, history of fibrillation, obesity, admitted with respiratory failure and septic shock in the setting of H flu pneumonia. Subsequent course complicated by retroperitoneal bleeding on anticoagulation, hemorrhagic shock, acute respiratory failure and acute renal failure. s/p trach , tolerating ATC daytime, off CVVHD.  Pressors off  Unfortunately has rt humerus fracture -conservative mx  ASSESSMENT / PLAN:  PULMONARY A: Acute respiratory failure with chronoic resp failure - ATC since 05/30/16 Pneumonia, H influenza (beta lactamase positive) Presumed OSA/OHS History of asthma Bilateral pulm infiltrates / effusions, consistent w volume overload  06/04/16   - needs to go back on vent due to sepsis yndrome , likely HCAP  P:   Back on full vent support  CARDIOVASCULAR A:  Shock, principally hemorrhagic.  Paroxysmal atrial fibrillation History of hypertension  3/30 -   Norepinephrine off  Off  stress dose steroids  06/04/16 - hypotnesion +  PLAN Fluid bolus 1L  and presors if needed No plan to restart anticoagulation at this time.  Lopresso rprn for rate control  RENAL I/O last 3 completed shifts: In: 3085 [  I.V.:355; NG/GT:2630; IV Piggyback:100] Out: 1901 [Urine:1601; Other:150; Stool:150] Lab Results  Component Value Date   CREATININE 2.77 (H) 06/04/2016   CREATININE 3.96 (H) 06/03/2016   CREATININE 3.73 (H) 06/02/2016   CREATININE 0.80 07/16/2012    A:   Acute renal failure, presumed due to ATN in the setting of hemorrhagic shock  3/30 - creat rising but making urine 3/31 - needs HD  06/04/16 - look ddru  P:   1L fluid bolus   GASTROINTESTINAL A:   Large retroperitoneal bleed noted, some slight increased 3/18 CT scan, hemoglobin currently stable Shock liver, transaminitis, improving Hepatic lesion; noted on CT scan of the abdomen 3/18. 5 cm lesion of the dome of the liver, could represent blood or infection or infarct.  Protein calorie malnutrition   - no evidence of ative bleed P:   Continue PPI [place] not helpful , will need rpt CT to reimage liver lesion  At some point Continue tube feeds Ct  rectal tube . His scrotal edema and skin breakdown of concern  HEMATOLOGIC  Recent Labs Lab 06/01/16 1801 06/03/16 0305 06/04/16 0442  HGB 7.7* 7.7* 7.2*  HCT 24.7* 23.9* 22.4*  WBC 5.4 5.2 5.2  PLT 74* 76* 64*    A:   Acute blood loss anemia, improved Severe heparin sensitivity, associated with hemorrhage Thrombocytopenia, acute on chronic -came in low at 140k  - likely sepsis mediated platelet drop  P:  Follow CBC, Goal hemoglobin greater than 7.0 No heparin ever hold anticoagulation  SCD for DVT prophylaxis  INFECTIOUS A:   H. influenzae pneumonia, treated HCAP Septic shock ? Clinical significance hepatic lesion, ? Possible abscess   3/30 - on abx. Not on pressors 06/04/16 - likely HCAP  P:   Antibiotics for pneumonia were completed  cefepime, started 3/23, Changed to Vanc and Merrem 06/04/16  Pan  culture SEpsis biomarker check   ENDOCRINE CBG (last 3)   Recent Labs  06/04/16 0430 06/04/16 0725 06/04/16 1145  GLUCAP 119* 129* 123*     A:   Hyperglycemia, on steroids Hypothyroidism P:   Continue SSI, CBG Continue Synthroid  NEUROLOGIC A:   Toxic metabolic encephalopathy, starting to clear his mental status History of CVA with residual deficits Rt humerus fracture -   3/31 - follows comands 4/1 - more tired due to sepsis P:   PT consult - LTAC recommended RUE sling Morphine prn pain  MSK A Rt shoulder fracture during hospital stay P sling   FAMILY DISCUSSIONS:  Full code. Family very upset at outcome, bleeding - see prior notes, NO HEPARIN per their prior experience D/w granddaughter 3/28 POA, does not want LTAC, prefers that he stay at [facility]. Updated DPOA 06/03/16 No family at bedisde 06/04/2016. Needs LTAC   - Inter-disciplinary family meet or Palliative Care meeting due by:  done  CODE STATUS Full Code  DISPO Keep in ICU given above issues     The patient is critically ill with multiple organ systems failure and requires high complexity decision making for assessment and support, frequent evaluation and titration of therapies, application of advanced monitoring technologies and extensive interpretation of multiple databases.   Critical Care Time devoted to patient care services described in this note is  30  Minutes. This time reflects time of care of this signee Dr Kalman Shan. This critical care time does not reflect procedure time, or teaching time or supervisory time of PA/NP/Med student/Med Resident etc but could involve care discussion time  Dr. Kalman Shan, M.D., [facility].C.P Pulmonary and Critical Care Medicine Staff Physician [place] [facility]  Pulmonary and Critical Care Pager: [phone], If no answer or between  15:00h - 7:00h: call [phone]  06/04/2016 12:39 PM

## 2016-06-04 NOTE — Progress Notes (Signed)
lAbs below   - anemia of crticial illness - PCT > 5 - c/w sepsis - lactate normal  Plan 1 unit prbc  - agree with abx   Dr. Kalman Shan, M.D., [facility].C.P Pulmonary and Critical Care Medicine Staff Physician [facility] Pager: [phone], If no answer or between  15:00h - 7:00h: call [phone]  06/04/2016 3:00 PM       PULMONARY No results for input(s): PHART, PCO2ART, PO2ART, HCO3, TCO2, O2SAT in the last 168 hours.  Invalid input(s): PCO2, PO2  CBC  Recent Labs Lab 06/03/16 0305 06/04/16 0442 06/04/16 1350  HGB 7.7* 7.2* 6.7*  HCT 23.9* 22.4* 21.2*  WBC 5.2 5.2 5.0  PLT 76* 64* 64*    COAGULATION  Recent Labs Lab 06/01/16 1801  INR 1.32    CARDIAC  No results for input(s): TROPONINI in the last 168 hours. No results for input(s): PROBNP in the last 168 hours.   CHEMISTRY  Recent Labs Lab 05/31/16 0447  05/31/16 1512 06/01/16 0350 06/02/16 0409 06/03/16 0305 06/04/16 0442  NA 136  < > 136 136 138 139 135  K 4.0  < > 4.2 3.8 4.1 4.5 3.6  CL 101  < > 101 101 101 101 97*  CO2 27  < > GLUCOSE 127*  < > 138* 138* 127* 126* 126*  BUN 80*  < > 95* 110* 139* 158* 96*  CREATININE 2.52*  < > 2.90* 3.15* 3.73* 3.96* 2.77*  CALCIUM 8.4*  < > 8.5* 8.5* 8.3* 8.4* 7.8*  MG 2.0  --   --  2.1 1.9 2.0 1.8  PHOS  --   < > 3.2 3.6 3.8 4.6 3.4  < > = values in this interval not displayed. Estimated Creatinine Clearance: 33 mL/min (A) (by C-G formula based on SCr of 2.77 mg/dL (H)).   LIVER  Recent Labs Lab 05/31/16 1512 06/01/16 0350 06/01/16 1801 06/02/16 0409 06/03/16 0305 06/04/16 0442  ALBUMIN 2.1* 2.0*  --  2.0* 1.9* 1.9*  INR  --   --  1.32  --   --   --      INFECTIOUS  Recent Labs Lab 06/04/16 1350  LATICACIDVEN 1.3  PROCALCITON 6.13     ENDOCRINE CBG (last 3)   Recent Labs  06/04/16 0430 06/04/16 0725 06/04/16 1145  GLUCAP 119* 129* 123*          IMAGING x48h  - image(s) personally visualized  -   highlighted in bold No results found.

## 2016-06-04 NOTE — Progress Notes (Signed)
Granddaughter Oneida Alar, requests that patient be transferred to [facility].  Request communicated to [facility] Dr. Dellie Catholic and Dr. Zonia Kief.  Awaiting further instruction.

## 2016-06-04 NOTE — Progress Notes (Signed)
Pharmacy Antibiotic Note  Omar [name] is a 71 y.o. male s/p multiple rounds of abx for pneumonia, trach stomatitis and now re-broadening abx today due to new VAP. Still having copious secretions, low grade fever, pan-cultured today. Off CRRT, on iHD but restarting pressors right now.    Plan: -Meropenem 500 mg IV q24h -Vancomycin 2 g IV x1, maintenance vanc based off of HD schedule -F/u HD schedule + tolerance, will need to enter  -F/u repeat cultures   Height:  (167.6 cm) Weight: (!) 323 lb (146.5 kg) IBW/kg (Calculated) : 63.8  Temp (24hrs), Avg:99 F (37.2 C), Min:97.6 F (36.4 C), Max:100.8 F (38.2 C)   Recent Labs Lab 05/31/16 0447  05/31/16 0920 05/31/16 1512 06/01/16 0350 06/01/16 1801 06/02/16 0409 06/03/16 0305 06/04/16 0442  WBC 10.3  --   --   --  7.2 5.4  --  5.2 5.2  CREATININE 2.52*  < >  --  2.90* 3.15*  --  3.73* 3.96* 2.77*  VANCORANDOM  --   --  37  --   --   --   --   --   --   < > = values in this interval not displayed.  Estimated Creatinine Clearance: 33 mL/min (A) (by C-G formula based on SCr of 2.77 mg/dL (H)).    Allergies  Allergen Reactions  . Anacin Af [Acetaminophen]   . Heparin     Retroperitoneal bleed in 2018, previous bleed from heparin in 2013  . Niaspan [Niacin Er]     Antimicrobials this admission: Zosyn 3/6 >> 3/8 CTX 3/8 >> 3/14 Cefepime 3/23 >> 4/1 Vancomycin 3/23 >> 3/28, 4/1 > Meropenem 4/1 >   Microbiology results: 3/6 MRSA PCR >>negative 3/6 Blood >> negative 3/5 Urine >> negative 3/6 Resp >> H. Influenzae, beta lactamase positive 3/23 Resp >> Diptheroids 3/28 Resp >> NF 4/1 blood cx:  4/1 resp cx:   Baldemar Friday 06/04/2016 12:39 PM

## 2016-06-04 DEATH — deceased

## 2016-06-05 ENCOUNTER — Inpatient Hospital Stay (HOSPITAL_COMMUNITY): Payer: Medicare Other

## 2016-06-05 DIAGNOSIS — R6521 Severe sepsis with septic shock: Secondary | ICD-10-CM

## 2016-06-05 DIAGNOSIS — A419 Sepsis, unspecified organism: Principal | ICD-10-CM

## 2016-06-05 LAB — CBC WITH DIFFERENTIAL/PLATELET
BASOS ABS: 0 10*3/uL (ref 0.0–0.1)
Basophils Relative: 0 %
EOS ABS: 0.2 10*3/uL (ref 0.0–0.7)
Eosinophils Relative: 3 %
HEMATOCRIT: 24 % — AB (ref 39.0–52.0)
Hemoglobin: 7.8 g/dL — ABNORMAL LOW (ref 13.0–17.0)
LYMPHS ABS: 0.6 10*3/uL — AB (ref 0.7–4.0)
Lymphocytes Relative: 11 %
MCH: 34.1 pg — ABNORMAL HIGH (ref 26.0–34.0)
MCHC: 32.5 g/dL (ref 30.0–36.0)
MCV: 104.8 fL — ABNORMAL HIGH (ref 78.0–100.0)
Monocytes Absolute: 0.5 10*3/uL (ref 0.1–1.0)
Monocytes Relative: 9 %
NEUTROS ABS: 4.5 10*3/uL (ref 1.7–7.7)
Neutrophils Relative %: 77 %
PLATELETS: 70 10*3/uL — AB (ref 150–400)
RBC: 2.29 MIL/uL — AB (ref 4.22–5.81)
WBC: 5.8 10*3/uL (ref 4.0–10.5)

## 2016-06-05 LAB — GLUCOSE, CAPILLARY
GLUCOSE-CAPILLARY: 125 mg/dL — AB (ref 65–99)
GLUCOSE-CAPILLARY: 131 mg/dL — AB (ref 65–99)
Glucose-Capillary: 146 mg/dL — ABNORMAL HIGH (ref 65–99)
Glucose-Capillary: 117 mg/dL — ABNORMAL HIGH (ref 65–99)
Glucose-Capillary: 106 mg/dL — ABNORMAL HIGH (ref 65–99)

## 2016-06-05 LAB — TYPE AND SCREEN
ABO/RH(D): B POS
ANTIBODY SCREEN: NEGATIVE
UNIT DIVISION: 0

## 2016-06-05 LAB — RENAL FUNCTION PANEL
ALBUMIN: 1.9 g/dL — AB (ref 3.5–5.0)
ALBUMIN: 2 g/dL — AB (ref 3.5–5.0)
ANION GAP: 9 (ref 5–15)
Anion gap: 11 (ref 5–15)
BUN: 131 mg/dL — ABNORMAL HIGH (ref 6–20)
BUN: 96 mg/dL — AB (ref 6–20)
CO2: 25 mmol/L (ref 22–32)
CO2: 26 mmol/L (ref 22–32)
CREATININE: 3.96 mg/dL — AB (ref 0.61–1.24)
Calcium: 8.3 mg/dL — ABNORMAL LOW (ref 8.9–10.3)
Calcium: 8.1 mg/dL — ABNORMAL LOW (ref 8.9–10.3)
Chloride: 98 mmol/L — ABNORMAL LOW (ref 101–111)
Chloride: 100 mmol/L — ABNORMAL LOW (ref 101–111)
Creatinine, Ser: 3.01 mg/dL — ABNORMAL HIGH (ref 0.61–1.24)
GFR calc Af Amer: 22 mL/min — ABNORMAL LOW (ref >60)
GFR calc non Af Amer: 19 mL/min — ABNORMAL LOW (ref >60)
GFR, EST AFRICAN AMERICAN: 16 mL/min — AB (ref >60)
GFR, EST NON AFRICAN AMERICAN: 14 mL/min — AB (ref >60)
GLUCOSE: 134 mg/dL — AB (ref 65–99)
Glucose, Bld: 125 mg/dL — ABNORMAL HIGH (ref 65–99)
PHOSPHORUS: 5 mg/dL — AB (ref 2.5–4.6)
POTASSIUM: 4.6 mmol/L (ref 3.5–5.1)
Phosphorus: 4.4 mg/dL (ref 2.5–4.6)
Potassium: 4.5 mmol/L (ref 3.5–5.1)
SODIUM: 135 mmol/L (ref 135–145)
Sodium: 134 mmol/L — ABNORMAL LOW (ref 135–145)

## 2016-06-05 LAB — BPAM RBC
BLOOD PRODUCT EXPIRATION DATE: 201804202359
ISSUE DATE / TIME: 201804011737
UNIT TYPE AND RH: 7300

## 2016-06-05 LAB — CORTISOL: Cortisol, Plasma: 42.6 ug/dL

## 2016-06-05 LAB — PROCALCITONIN: PROCALCITONIN: 6.68 ng/mL

## 2016-06-05 LAB — MAGNESIUM: MAGNESIUM: 2 mg/dL (ref 1.7–2.4)

## 2016-06-05 MED ORDER — HYDROCORTISONE NA SUCCINATE PF 100 MG IJ SOLR
50.0000 mg | Freq: Four times a day (QID) | INTRAMUSCULAR | Status: DC
  Administered 2016-06-05: 50 mg via INTRAVENOUS
  Filled 2016-06-05: qty 2
  Filled 2016-06-05: qty 1

## 2016-06-05 MED ORDER — PRISMASOL BGK 4/2.5 32-4-2.5 MEQ/L IV SOLN
INTRAVENOUS | Status: DC
Start: 2016-06-05 — End: 2016-06-08
  Administered 2016-06-05 – 2016-06-07 (×8): via INTRAVENOUS_CENTRAL
  Filled 2016-06-05 (×14): qty 5000

## 2016-06-05 MED ORDER — SODIUM CHLORIDE 0.9 % FOR CRRT
INTRAVENOUS_CENTRAL | Status: DC | PRN
  Administered 2016-06-05 – 2016-06-06 (×3): via INTRAVENOUS_CENTRAL
  Filled 2016-06-05 (×4): qty 1000

## 2016-06-05 MED ORDER — SODIUM CHLORIDE 0.9 % IV SOLN
1.0000 g | Freq: Two times a day (BID) | INTRAVENOUS | Status: DC
  Administered 2016-06-05 – 2016-06-07 (×5): 1 g via INTRAVENOUS
  Filled 2016-06-05 (×5): qty 1

## 2016-06-05 MED ORDER — PRISMASOL BGK 4/2.5 32-4-2.5 MEQ/L IV SOLN
INTRAVENOUS | Status: DC
  Administered 2016-06-05 – 2016-06-07 (×7): via INTRAVENOUS_CENTRAL
  Filled 2016-06-05 (×12): qty 5000

## 2016-06-05 MED ORDER — PRISMASOL BGK 4/2.5 32-4-2.5 MEQ/L IV SOLN
INTRAVENOUS | Status: DC
  Administered 2016-06-05 – 2016-06-07 (×18): via INTRAVENOUS_CENTRAL
  Filled 2016-06-05 (×36): qty 5000

## 2016-06-05 MED ORDER — HEPARIN SODIUM (PORCINE) 1000 UNIT/ML DIALYSIS
1000.0000 [IU] | INTRAMUSCULAR | Status: DC | PRN
  Filled 2016-06-05: qty 6

## 2016-06-05 NOTE — Progress Notes (Addendum)
PCCM Progress Note  Admission date: 05/20/2016 Referring provider: Dr. Clarene Duke, [facility] ER  CC: altered mental status  HPI: 71 y/o male from [facility] admitted 3/5 with altered mental status + H.flu PNA with aspiration, sepsis, hypoxic/hypercapnic respiratory failure. Course c/b retroperitoneal bleed while on heparin with subsequent shock, acute renal failure & prolonged ventilation requiring tracheostomy.   has a past medical history of Asthma; Atrial fibrillation (HCC); Gout; Hypertension; Morbid obesity (HCC); Multinodular goiter; Osteoarthrosis and allied disorders; Other and unspecified hyperlipidemia; Stroke [facility]); Thrombocytopenia (HCC); and Vitamin D deficiency.  Antibiotics: Zosyn 3/06 >> 3/08 Rocephin (H.Flu PNA) 3/08 >> 3/14 Vancomycin 3/23 >> 3/28 Cefepime 3/23 >>   Cultures: Urine 3/05 >> negative Blood 3/06 >> negative Resp 3/6 >> H flu (b-lactamase +) Trach stoma site 3/23 >> diphtheroids Resp cx 3/28 >>nl flora  Lines/tubes: ETT 3/05 >> 3/8 ETT 3/10 >> 3/21 Trach (Dr Jenne Pane) 3/21 >>  Lt IJ CVL 3/07 >> 3/27 R HD catheter  >> 3/22 R IJ HD catheter 3/23 >>   Events: 3/05  Transfer to [facility] Sputum 3/06 >> Haemophilus influenzae (beta lactamase positive) Echo 3/06 >> EF 65 to 70%, mod LVH, mod TR, PAS 39 mmHg CT head 3/7 >> no acute abnormality  3/10 - Large retroperitoneal bleed noted on CT after c/o back pain/leg pain 3/27 rt humerus fracture identified - ortho consult 3/27 RUQ [place] .Marland Kitchen Not helpful due to body habitus 3/29 - Chronic  critically ill On ATC since 3/27  with copious secretions c/o RUE pain decreased Liquid stool  3/27 - end CRRT  3/30 - doing ATC x 24-48h per RN. Rt shoulder pain only when touched per RN. Making urine. Off CRRT. Not on pressors. Family not fully on board about LTAC per RN but looking at [facility]. Needs LTAC.   SUBJECTIVE/OVERNIGHT/INTERVAL HX 06/05/16: worsening AMS + new right-sided gaze which seemed to be new per RN. STAT CT head without  acute process. Also, required vent ON + now on pressors + CRRT.   Vital signs: BP 111/66   Pulse (!) 119   Temp 98.7 F (37.1 C) (Oral)   Resp (!) 22   Ht  (1.676 m)   Wt (!) 336 lb (152.4 kg)   SpO2 99%   BMI 54.23 kg/m   Intake/output:  Intake/Output Summary (Last 24 hours) at 06/05/16 0756 Last data filed at 06/05/16 0700  Gross per 24 hour  Intake          3275.83 ml  Output              450 ml  Net          2825.83 ml   I/O last 3 completed shifts: In: 4390.8 [I.V.:1301.7; Blood:359.2; NG/GT:2630; IV Piggyback:100] Out: 626 [Urine:626]  EXAM General: [name]. Obese HENT: [place]/AT. No obv abnormality. EOMI. +TRACH Lungs: Coarse breath sounds throughout Chest wall: not tender to palp Heart: RRR. On pressors Abdomen: soft, non tender, not distended. +bs Extremities: 1+edema BL Neuro: Alert and interactive. Moves all extremities.   LABS  PULMONARY  Recent Labs Lab 06/04/16 1550  PHART 7.387  PCO2ART 46.5  PO2ART 89.2  HCO3 26.9  O2SAT 96.5    CBC  Recent Labs Lab 06/04/16 1350 06/04/16 2240 06/05/16 0549  HGB 6.7* 7.9* 7.8*  HCT 21.2* 24.6* PENDING  WBC 5.0 6.6 5.8  PLT 64* 76* 70*    COAGULATION  Recent Labs Lab 06/01/16 1801  INR 1.32    CARDIAC  No results  for input(s): TROPONINI in the last 168 hours. No results for input(s): PROBNP in the last 168 hours.   CHEMISTRY  Recent Labs Lab 06/01/16 0350 06/02/16 0409 06/03/16 0305 06/04/16 0442 06/04/16 2040 06/05/16 0549  NA 136 138 139 135 136 134*  K 3.8 4.1 4.5 3.6 4.4 4.5  CL 101 101 101 97* 98* 98*  CO2 GLUCOSE 138* 127* 126* 126* 146* 125*  BUN 110* 139* 158* 96* 121* 131*  CREATININE 3.15* 3.73* 3.96* 2.77* 3.65* 3.96*  CALCIUM 8.5* 8.3* 8.4* 7.8* 8.2* 8.3*  MG 2.1 1.9 2.0 1.8  --  2.0  PHOS 3.6 3.8 4.6 3.4  --  5.0*   Estimated Creatinine Clearance: 23.7 mL/min (A) (by C-G formula based on SCr of 3.96 mg/dL  (H)).  LIVER  Recent Labs Lab 06/01/16 0350 06/01/16 1801 06/02/16 0409 06/03/16 0305 06/04/16 0442 06/05/16 0549  ALBUMIN 2.0*  --  2.0* 1.9* 1.9* 1.9*  INR  --  1.32  --   --   --   --    INFECTIOUS  Recent Labs Lab 06/04/16 1350 06/05/16 0549  LATICACIDVEN 1.3  --   PROCALCITON 6.13 6.68     ENDOCRINE CBG (last 3)   Recent Labs  06/04/16 2353 06/05/16 0435 06/05/16 0734  GLUCAP 127* 125* 146*    IMAGING x48h  - image(s) personally visualized  -   highlighted in bold Ct Head Wo Contrast  Result Date: 06/04/2016 CLINICAL DATA:  Altered mental status EXAM: CT HEAD WITHOUT CONTRAST TECHNIQUE: Contiguous axial images were obtained from the base of the skull through the vertex without intravenous contrast. COMPARISON:  05/10/2016, 05/23/2016. FINDINGS: Brain: There is no intracranial hemorrhage, mass or evidence of acute infarction. There is moderate generalized atrophy. There is mild chronic microvascular ischemic change. There is no significant extra-axial fluid collection. No acute intracranial findings are evident. Vascular: No hyperdense vessel or unexpected calcification. Skull: Normal. Negative for fracture or focal lesion. Sinuses/Orbits: Paranasal sinuses are clear. Unchanged venous varices of the orbits. Other: None. IMPRESSION: No acute intracranial findings. There is moderate generalized atrophy and chronic appearing white matter hypodensities which likely represent small vessel ischemic disease. Electronically Signed   By: Ellery Plunk M.D.   On: 06/04/2016 22:29   Dg Shoulder Right Port  Result Date: 06/05/2016 CLINICAL DATA:  Proximal humeral fracture. EXAM: PORTABLE RIGHT SHOULDER COMPARISON:  Chest x-ray 06/01/2016.  Humerus series 3 09/22/2016. FINDINGS: Fracture of the proximal right humerus again noted without interim change. Minimal displacement on single portable view. IMPRESSION: Stable appearing fracture of the proximal right humerus. Electronically  Signed   By: Maisie Fus  Register   On: 06/05/2016 07:00    DISCUSSION: 71 year old man, history of fibrillation, obesity, admitted with respiratory failure and septic shock in the setting of H flu pneumonia. Subsequent course complicated by retroperitoneal bleeding on anticoagulation, hemorrhagic shock, acute respiratory failure and acute renal failure. s/p trach. Currently back on vent and CRRT.  Unfortunately has rt humerus fracture -conservative mx  ASSESSMENT / PLAN:  PULMONARY A: Acute respiratory failure with chronoic resp failure - ATC since 05/30/16 Pneumonia, H influenza (beta lactamase positive) Presumed OSA/OHS History of asthma Bilateral pulm infiltrates / effusions, consistent w volume overload  06/04/16 Back on vent due to sepsis syndrome, likely HCAP  P:  Full vent support 4/1.   CARDIOVASCULAR A:  Shock, principally hemorrhagic however perhaps component of septic Paroxysmal atrial fibrillation History of hypertension  3/30 -  Norepinephrine off  Off  stress dose steroids  06/04/16 - hypotnesion  PLAN S/p 1L bolus and now on pressors. Wean as tolerated. Cortisol level now then restarting stress-dose steroids.  D/c anti-htn  RENAL I/O last 3 completed shifts: In: 4390.8 [I.V.:1301.7; Blood:359.2; NG/GT:2630; IV Piggyback:100] Out: 626 [Urine:626] Lab Results  Component Value Date   CREATININE 3.96 (H) 06/05/2016   CREATININE 3.65 (H) 06/04/2016   CREATININE 2.77 (H) 06/04/2016   CREATININE 0.80 07/16/2012   A:   Acute renal failure, presumed due to ATN in the setting of hemorrhagic shock  3/30 - creat rising but making urine 3/31 - needs HD  06/04/16 - look dry  P:  ?Start CRRT.  KVO IVF  GASTROINTESTINAL A:   Large retroperitoneal bleed noted, some slight increased 3/18 CT scan, hemoglobin currently stable Shock liver, transaminitis, improving Hepatic lesion; noted on CT scan of the abdomen 3/18. 5 cm lesion of the dome of the liver, could represent  blood or infection or infarct.  Protein calorie malnutrition   - no evidence of active bleed P:   Continue PPI Continue tube feeds Granddaughter considering PEG tube placement.  HEMATOLOGIC  Recent Labs Lab 06/04/16 1350 06/04/16 2240 06/05/16 0549  HGB 6.7* 7.9* 7.8*  HCT 21.2* 24.6* PENDING  WBC 5.0 6.6 5.8  PLT 64* 76* 70*    A:   Acute blood loss anemia, improved Severe heparin sensitivity, associated with hemorrhage Thrombocytopenia, acute on chronic -came in low at 140k  P:  Follow CBC, Goal hemoglobin greater than 7.0 No heparin ever Hold anticoagulation  SCD for DVT prophylaxis  INFECTIOUS A:   H. influenzae pneumonia, treated HCAP Septic shock ? Clinical significance hepatic lesion, ? Possible abscess   3/30 - on abx. Not on pressors 06/04/16 - likely HCAP, started back on pressors   P: Antibiotics for pneumonia were completed  cefepime, started 3/23, changed to Vanc and Merrem 06/04/16  Pct 6.68, no leukocytosis Pan culture: Tracheal asp with few GNR, GP cocci in pairs and clusters  ENDOCRINE CBG (last 3)   Recent Labs  06/04/16 2353 06/05/16 0435 06/05/16 0734  GLUCAP 127* 125* 146*   A:   Hyperglycemia, on steroids Hypothyroidism P:   Continue SSI, CBG Continue Synthroid  NEUROLOGIC A:   Toxic metabolic encephalopathy, starting to clear his mental status History of CVA with residual deficits Rt humerus fracture   3/31 - follows comands 4/1 - more tired due to sepsis  P:   PT consult - LTAC recommended RUE sling Morphine prn pain   MSK A Rt shoulder fracture during hospital stay  P: sling + conserv management  FAMILY DISCUSSIONS: Case discussed with daughter via phone - demanding transfer and acusing team of 'trying to kill her father' and using extremely inappropriate language throughout. Was again informed that we would be happy to transfer, but she needs to arrange.  LTAC recommended, granddaughter requests staying at [facility]  vs [facility]  Inter-disciplinary family meet or Palliative Care meeting due by:  done  CODE STATUS Full Code  DISPO Keep in ICU given above issues  The patient is critically ill with multiple organ systems failure and requires high complexity decision making for assessment and support, frequent evaluation and titration of therapies, application of advanced monitoring technologies and extensive interpretation of multiple databases.   Kailen Name, D.O. Internal Medicine Resident PGY1 Pager [phone]

## 2016-06-05 NOTE — Progress Notes (Signed)
SLP Cancellation Note  Patient Details Name: Omar [name] MRN: [id number] DOB: [birth year]   Cancelled treatment:       Reason Eval/Treat Not Completed: Medical issues which prohibited therapy- pt transitioned today to ATC, but MD requests cuff remain inflated.  SLP continuing to follow along for PMV and swallow eval when able.   Blenda Mounts [name] 06/05/2016, 3:22 PM

## 2016-06-05 NOTE — Progress Notes (Signed)
Pharmacy Antibiotic Note  UNIQUE SEARFOSS is a 71 y.o. male s/p multiple rounds of abx for pneumonia, trach stomatitis and now re-broadening abx on 4/1 due to new VAP. Still having copious secretions, low grade fever, pan-cultured today. CRRT has been restarted today at 0840. Will adjust antibiotics for CRRT dosing.   Plan: -Meropenem 1 g IV q12h -Vancomycin random level with tomorrow AM labs to enter appropriate dose given fluctuating renal function -F/u repeat cultures   Height:  (167.6 cm) Weight: (!) 336 lb (152.4 kg) IBW/kg (Calculated) : 63.8  Temp (24hrs), Avg:99.9 F (37.7 C), Min:98.6 F (37 C), Max:100.8 F (38.2 C)   Recent Labs Lab 05/31/16 0920  06/02/16 0409 06/03/16 0305 06/04/16 0442 06/04/16 1350 06/04/16 2040 06/04/16 2240 06/05/16 0549  WBC  --   < >  --  5.2 5.2 5.0  --  6.6 5.8  CREATININE  --   < > 3.73* 3.96* 2.77*  --  3.65*  --  3.96*  LATICACIDVEN  --   --   --   --   --  1.3  --   --   --   VANCORANDOM 37  --   --   --   --   --   --   --   --   < > = values in this interval not displayed.  Estimated Creatinine Clearance: 23.7 mL/min (A) (by C-G formula based on SCr of 3.96 mg/dL (H)).    Allergies  Allergen Reactions  . Anacin Af [Acetaminophen]   . Heparin     Retroperitoneal bleed in 2018, previous bleed from heparin in 2013  . Niaspan [Niacin Er]     Antimicrobials this admission: Zosyn 3/6 >> 3/8 CTX 3/8 >> 3/14 Cefepime 3/23 >> 4/1 Vancomycin 3/23 >> 3/28, 4/1 > Meropenem 4/1 >  Dose adjustments this admission:  3/28 Vanc random = 37  Microbiology results: 3/6 MRSA PCR >>negative 3/6 Blood >> negative 3/5 Urine >> negative 3/6 Resp >> H. Influenzae, beta lactamase positive 3/23 Resp >> Diptheroids 3/28 Resp >> NF 4/1 Blood >> 4/1 Resp >> 4/1 Urine >>  Allie Bossier, PharmD PGY1 Pharmacy Resident [phone] (Pager) 06/05/2016 9:35 AM

## 2016-06-05 NOTE — Progress Notes (Signed)
This note also relates to the following rows which could not be included: Pulse Rate - Cannot attach notes to unvalidated device data SpO2 - Cannot attach notes to unvalidated device data  Titrated FIO2 to 28%, 6 L, sat 100%.  RN notified.

## 2016-06-05 NOTE — Progress Notes (Signed)
Subjective: Interval History: has no complaint , awake coop but not consistent.  Objective: Vital signs in last 24 hours: Temp:  [98.6 F (37 C)-100.8 F (38.2 C)] 98.6 F (37 C) (04/02 0440) Pulse Rate:  [90-148] 107 (04/02 0600) Resp:  [12-29] 18 (04/02 0600) BP: (66-129)/(38-73) 116/59 (04/02 0600) SpO2:  [88 %-100 %] 100 % (04/02 0600) FiO2 (%):  [30 %-50 %] 30 % (04/02 0351) Weight:  [152.4 kg (336 lb)] 152.4 kg (336 lb) (04/02 0500) Weight change: 5.897 kg (13 lb)  Intake/Output from previous day: 04/01 0701 - 04/02 0700 In: 3150.8 [I.V.:1131.7; Blood:359.2; NG/GT:1610; IV Piggyback:50] Out: 450 [Urine:450] Intake/Output this shift: Total I/O In: 1878.6 [I.V.:749.4; Blood:359.2; NG/GT:770] Out: 200 [Urine:200]  General appearance: morbidly obese, pale and slowed mentation Neck: trach Resp: rales bilaterally and rhonchi bilaterally Cardio: irregularly irregular rhythm GI: obese pos bs, liver down 6 cm Extremities: obese, pos bs, soft, liver down 6 cm  Lab Results:  Recent Labs  06/04/16 1350 06/04/16 2240  WBC 5.0 6.6  HGB 6.7* 7.9*  HCT 21.2* 24.6*  PLT 64* 76*   BMET:  Recent Labs  06/04/16 0442 06/04/16 2040  NA 135 136  K 3.6 4.4  CL 97* 98*  CO2 29 26  GLUCOSE 126* 146*  BUN 96* 121*  CREATININE 2.77* 3.65*  CALCIUM 7.8* 8.2*   No results for input(s): PTH in the last 72 hours. Iron Studies: No results for input(s): IRON, TIBC, TRANSFERRIN, FERRITIN in the last 72 hours.  Studies/Results: Ct Head Wo Contrast  Result Date: 06/04/2016 CLINICAL DATA:  Altered mental status EXAM: CT HEAD WITHOUT CONTRAST TECHNIQUE: Contiguous axial images were obtained from the base of the skull through the vertex without intravenous contrast. COMPARISON:  05/10/2016, 05/21/2016. FINDINGS: Brain: There is no intracranial hemorrhage, mass or evidence of acute infarction. There is moderate generalized atrophy. There is mild chronic microvascular ischemic change.  There is no significant extra-axial fluid collection. No acute intracranial findings are evident. Vascular: No hyperdense vessel or unexpected calcification. Skull: Normal. Negative for fracture or focal lesion. Sinuses/Orbits: Paranasal sinuses are clear. Unchanged venous varices of the orbits. Other: None. IMPRESSION: No acute intracranial findings. There is moderate generalized atrophy and chronic appearing white matter hypodensities which likely represent small vessel ischemic disease. Electronically Signed   By: Ellery Plunk M.D.   On: 06/04/2016 22:29    I have reviewed the patient's current medications.  Assessment/Plan: 1 AKI oliguric.  Related to ischemia assumed.  Vol xs , ^ solute.  Needs RRT, lower solute ,vol 2 Vol xs 3 Anemia esa/Fe 4 Resp failure acute and chronic 5 OSA 6 CVA 7 Massive obesity 8 VAP worsened P AB, start CRRT for  Vol, solute   LOS: 28 days   Omar [name] L 06/05/2016,7:00 AM

## 2016-06-05 NOTE — Progress Notes (Signed)
PCCM Progress Note  Admission date: 05/12/2016 Referring provider: Dr. Clarene Duke, [facility]  CC: altered mental status  HPI: 71 y/o male from [facility] admitted 3/5 with altered mental status with aspiration, sepsis, hypoxic/hypercapnic respiratory failure. Course c/b retroperitoneal bleed on heparin , shock, acute renal failure & prolonged ventilation requiring tracheostomy    has a past medical history of Asthma; Atrial fibrillation (HCC); Gout; Hypertension; Morbid obesity (HCC); Multinodular goiter; Osteoarthrosis and allied disorders; Other and unspecified hyperlipidemia; Stroke [facility]); Thrombocytopenia (HCC); and Vitamin D deficiency.   Antibiotics: Zosyn 3/06 >> 3/08 Rocephin (H.Flu PNA) 3/08 >> 3/14 Vancomycin 3/23>>>3/28>>>4/1>>> Cefepime 3/23>>>4/1 Merrem 4/1>>>  Cultures: Urine 3/05 >> negative Blood 3/06 >> negative Resp 3/6 >> H flu (b-lactamase +) Trach stoma site 3/23 >> diphtheroids resp cx 3/28 >>nl flora  Lines/tubes: ETT 3/05 >> 3/8 ETT 3/10 >> 3/21 Trach (Dr Jenne Pane) 3/21 >>  Lt IJ CVL 3/07 >> 3/27 R HD catheter  >> 3/22 R IJ HD catheter 3/23 >>   Events: 3/05  Transfer to [facility] Sputum 3/06 >> Haemophilus influenzae (beta lactamase positive) Echo 3/06 >> EF 65 to 70%, mod LVH, mod TR, PAS 39 mmHg CT head 3/7 >> no acute abnormality  3/27 rt humerus fracture identified - ortho consult 3/27 RUQ [place] .Marland Kitchen Not helpful due to body habitus 3/29 - Chronic  critically ill On ATC since 3/27  with copious secretions c/o RUE pain decreased Liquid stool  3/30 - doing ATC x 24-48h per RN. Rt shoulder pain only when touched per RN. Making urine. Off CRRT. Not on pressors. Family not fully on board about LTAC per RN but looking at [facility]. Needs LTAC.   SUBJECTIVE/OVERNIGHT/INTERVAL HX Required ventilator overnight and back on pressors and CRRT  Vital signs: BP (!) 114/58   Pulse (!) 123   Temp 98.7 F (37.1 C) (Oral)   Resp (!) 23   Ht  (1.676 m)   Wt (!) 152.4 kg (336  lb)   SpO2 98%   BMI 54.23 kg/m   Intake/output:  Intake/Output Summary (Last 24 hours) at 06/05/16 0906 Last data filed at 06/05/16 0900  Gross per 24 hour  Intake          3350.71 ml  Output              523 ml  Net          2827.71 ml   I/O last 3 completed shifts: In: 4390.8 [I.V.:1301.7; Blood:359.2; NG/GT:2630; IV Piggyback:100] Out: 626 [Urine:626]  EXAM  General Appearance:    Acute on chronically ill appearing obese male  Head:    [place]/AT  Eyes:    PERRL, EOM-I    Ears:    Normal external ear canals, both ears  Nose:   NG tube  Throat:  Trach in place  Neck:   Supple,  No enlargement/tenderness/nodules     Lungs:     Coarse BS diffusely  Chest wall:    No deformity  Heart:    RRR, Nl S1/S2, -M/R/G.  Abdomen:     Soft, NT, ND and +BS        Extremities:   1+ edema     Skin:   Intact     Neurologic:   Alert and interactive, moving all ext to commands   LABS  PULMONARY  Recent Labs Lab 06/04/16 1550  PHART 7.387  PCO2ART 46.5  PO2ART 89.2  HCO3 26.9  O2SAT 96.5   CBC  Recent Labs Lab 06/04/16  1350 06/04/16 2240 06/05/16 0549  HGB 6.7* 7.9* 7.8*  HCT 21.2* 24.6* 24.0*  WBC 5.0 6.6 5.8  PLT 64* 76* 70*   COAGULATION  Recent Labs Lab 06/01/16 1801  INR 1.32   CARDIAC  No results for input(s): TROPONINI in the last 168 hours. No results for input(s): PROBNP in the last 168 hours.  CHEMISTRY  Recent Labs Lab 06/01/16 0350 06/02/16 0409 06/03/16 0305 06/04/16 0442 06/04/16 2040 06/05/16 0549  NA 136 138 139 135 136 134*  K 3.8 4.1 4.5 3.6 4.4 4.5  CL 101 101 101 97* 98* 98*  CO2 GLUCOSE 138* 127* 126* 126* 146* 125*  BUN 110* 139* 158* 96* 121* 131*  CREATININE 3.15* 3.73* 3.96* 2.77* 3.65* 3.96*  CALCIUM 8.5* 8.3* 8.4* 7.8* 8.2* 8.3*  MG 2.1 1.9 2.0 1.8  --  2.0  PHOS 3.6 3.8 4.6 3.4  --  5.0*   Estimated Creatinine Clearance: 23.7 mL/min (A) (by C-G formula based on SCr of 3.96 mg/dL  (H)).  LIVER  Recent Labs Lab 06/01/16 0350 06/01/16 1801 06/02/16 0409 06/03/16 0305 06/04/16 0442 06/05/16 0549  ALBUMIN 2.0*  --  2.0* 1.9* 1.9* 1.9*  INR  --  1.32  --   --   --   --    INFECTIOUS  Recent Labs Lab 06/04/16 1350 06/05/16 0549  LATICACIDVEN 1.3  --   PROCALCITON 6.13 6.68   ENDOCRINE CBG (last 3)   Recent Labs  06/04/16 2353 06/05/16 0435 06/05/16 0734  GLUCAP 127* 125* 146*   IMAGING x48h  - image(s) personally visualized  -   highlighted in bold Ct Head Wo Contrast  Result Date: 06/04/2016 CLINICAL DATA:  Altered mental status EXAM: CT HEAD WITHOUT CONTRAST TECHNIQUE: Contiguous axial images were obtained from the base of the skull through the vertex without intravenous contrast. COMPARISON:  05/10/2016, 06/02/2016. FINDINGS: Brain: There is no intracranial hemorrhage, mass or evidence of acute infarction. There is moderate generalized atrophy. There is mild chronic microvascular ischemic change. There is no significant extra-axial fluid collection. No acute intracranial findings are evident. Vascular: No hyperdense vessel or unexpected calcification. Skull: Normal. Negative for fracture or focal lesion. Sinuses/Orbits: Paranasal sinuses are clear. Unchanged venous varices of the orbits. Other: None. IMPRESSION: No acute intracranial findings. There is moderate generalized atrophy and chronic appearing white matter hypodensities which likely represent small vessel ischemic disease. Electronically Signed   By: Ellery Plunk M.D.   On: 06/04/2016 22:29   Dg Shoulder Right Port  Result Date: 06/05/2016 CLINICAL DATA:  Proximal humeral fracture. EXAM: PORTABLE RIGHT SHOULDER COMPARISON:  Chest x-ray 06/01/2016.  Humerus series 3 09/22/2016. FINDINGS: Fracture of the proximal right humerus again noted without interim change. Minimal displacement on single portable view. IMPRESSION: Stable appearing fracture of the proximal right humerus. Electronically  Signed   By: Maisie Fus  Register   On: 06/05/2016 07:00   DISCUSSION: 71 year old man, history of fibrillation, obesity, admitted with respiratory failure and septic shock in the setting of H flu pneumonia. Subsequent course complicated by retroperitoneal bleeding on anticoagulation, hemorrhagic shock, acute respiratory failure and acute renal failure. s/p trach , tolerating ATC daytime, off CVVHD.  Pressors off  Unfortunately has rt humerus fracture -conservative mx  ASSESSMENT / PLAN:  PULMONARY A: Acute respiratory failure with chronoic resp failure - ATC since 05/30/16 Pneumonia, H influenza (beta lactamase positive) Presumed OSA/OHS History of asthma Bilateral pulm infiltrates / effusions, consistent w volume overload  P:   - PS and hope to advance to TC today  CARDIOVASCULAR A:  Shock, principally hemorrhagic.  Paroxysmal atrial fibrillation History of hypertension  PLAN - D/C anti-HTN - Wean levophed as able - Cortisol level now - Restart stress dose steroids  RENAL I/O last 3 completed shifts: In: 4390.8 [I.V.:1301.7; Blood:359.2; NG/GT:2630; IV Piggyback:100] Out: 626 [Urine:626] Lab Results  Component Value Date   CREATININE 3.96 (H) 06/05/2016   CREATININE 3.65 (H) 06/04/2016   CREATININE 2.77 (H) 06/04/2016   CREATININE 0.80 07/16/2012   A:   Acute renal failure, presumed due to ATN in the setting of hemorrhagic shock  3/30 - creat rising but making urine 3/31 - needs HD  06/04/16 - look ddru  P:   - KVO IVF - Replace electrolytes as indicated - BMET in AM  GASTROINTESTINAL A:   Large retroperitoneal bleed noted, some slight increased 3/18 CT scan, hemoglobin currently stable Shock liver, transaminitis, improving Hepatic lesion; noted on CT scan of the abdomen 3/18. 5 cm lesion of the dome of the liver, could represent blood or infection or infarct.  Protein calorie malnutrition  P:   - Continue PPI - Continue tube feeds - Need PEG placement,  grand daughter thinking about it.  HEMATOLOGIC  Recent Labs Lab 06/04/16 1350 06/04/16 2240 06/05/16 0549  HGB 6.7* 7.9* 7.8*  HCT 21.2* 24.6* 24.0*  WBC 5.0 6.6 5.8  PLT 64* 76* 70*   A:   Acute blood loss anemia, improved Severe heparin sensitivity, associated with hemorrhage Thrombocytopenia, acute on chronic -came in low at 140k  P:  - No heparin - SCD for DVT prophylaxis - CBC in AM - Transfuse per ICU protocol  INFECTIOUS A:   H. influenzae pneumonia, treated HCAP Septic shock ? Clinical significance hepatic lesion, ? Possible abscess   P:   - Antibiotics for pneumonia were completed - Changed to Vanc and Merrem 06/04/16  - Pan culture - Sepsis biomarker check  ENDOCRINE CBG (last 3)   Recent Labs  06/04/16 2353 06/05/16 0435 06/05/16 0734  GLUCAP 127* 125* 146*   A:   Hyperglycemia, on steroids Hypothyroidism P:   - Continue SSI, CBG - Continue Synthroid  NEUROLOGIC A:   Toxic metabolic encephalopathy, starting to clear his mental status History of CVA with residual deficits Rt humerus fracture -   3/31 - follows comands 4/1 - more tired due to sepsis P:   - PT consult - LTAC recommended - RUE sling - Morphine prn pain  MSK A Rt shoulder fracture during hospital stay  P sling   FAMILY DISCUSSIONS:  Full code. Updated grand daughter bedside, informed that in order to transfer will need for family to arrange since it is a family request.  Daughter called and I spoke with her over the phone, she accused me of trying to kill her father, used extremely inappropriate language and demanded transfer or she will call a Clinical research associate.  I informed her that she will need to arrange transfer and I will be more than happy to do the discharge summary.  She continued to use inappropriate language and the conversation was terminated at that point.  - Inter-disciplinary family meet or Palliative Care meeting due by:  done  CODE STATUS Full Code  The  patient is critically ill with multiple organ systems failure and requires high complexity decision making for assessment and support, frequent evaluation and titration of therapies, application of advanced monitoring technologies and extensive interpretation of multiple databases.  Critical Care Time devoted to patient care services described in this note is  35  Minutes. This time reflects time of care of this signee Dr Koren Bound. This critical care time does not reflect procedure time, or teaching time or supervisory time of PA/NP/Med student/Med Resident etc but could involve care discussion time.  Alyson Reedy, M.D. [facility] Pulmonary/Critical Care Medicine. Pager: [phone]. After hours pager: [phone].   06/05/2016 9:06 AM

## 2016-06-06 ENCOUNTER — Inpatient Hospital Stay (HOSPITAL_COMMUNITY): Payer: Medicare Other

## 2016-06-06 DIAGNOSIS — J810 Acute pulmonary edema: Secondary | ICD-10-CM

## 2016-06-06 DIAGNOSIS — I69351 Hemiplegia and hemiparesis following cerebral infarction affecting right dominant side: Secondary | ICD-10-CM

## 2016-06-06 LAB — CBC WITH DIFFERENTIAL/PLATELET
BASOS PCT: 0 %
Basophils Absolute: 0 10*3/uL (ref 0.0–0.1)
EOS ABS: 0.2 10*3/uL (ref 0.0–0.7)
Eosinophils Relative: 4 %
HCT: 25.2 % — ABNORMAL LOW (ref 39.0–52.0)
Hemoglobin: 8.1 g/dL — ABNORMAL LOW (ref 13.0–17.0)
Lymphocytes Relative: 11 %
Lymphs Abs: 0.6 10*3/uL — ABNORMAL LOW (ref 0.7–4.0)
MCH: 33.8 pg (ref 26.0–34.0)
MCHC: 32.1 g/dL (ref 30.0–36.0)
MCV: 105 fL — ABNORMAL HIGH (ref 78.0–100.0)
MONO ABS: 0.4 10*3/uL (ref 0.1–1.0)
Monocytes Relative: 7 %
NEUTROS PCT: 78 %
Neutro Abs: 3.8 10*3/uL (ref 1.7–7.7)
Platelets: 50 10*3/uL — ABNORMAL LOW (ref 150–400)
RBC: 2.4 MIL/uL — ABNORMAL LOW (ref 4.22–5.81)
WBC: 5 10*3/uL (ref 4.0–10.5)

## 2016-06-06 LAB — GLUCOSE, CAPILLARY
GLUCOSE-CAPILLARY: 110 mg/dL — AB (ref 65–99)
GLUCOSE-CAPILLARY: 109 mg/dL — AB (ref 65–99)
GLUCOSE-CAPILLARY: 96 mg/dL (ref 65–99)
Glucose-Capillary: 105 mg/dL — ABNORMAL HIGH (ref 65–99)
Glucose-Capillary: 107 mg/dL — ABNORMAL HIGH (ref 65–99)
Glucose-Capillary: 87 mg/dL (ref 65–99)

## 2016-06-06 LAB — URINE CULTURE: CULTURE: NO GROWTH

## 2016-06-06 LAB — RENAL FUNCTION PANEL
ANION GAP: 5 (ref 5–15)
Albumin: 2 g/dL — ABNORMAL LOW (ref 3.5–5.0)
Albumin: 1.9 g/dL — ABNORMAL LOW (ref 3.5–5.0)
Anion gap: 7 (ref 5–15)
BUN: 64 mg/dL — ABNORMAL HIGH (ref 6–20)
BUN: 46 mg/dL — ABNORMAL HIGH (ref 6–20)
CO2: 26 mmol/L (ref 22–32)
CO2: 26 mmol/L (ref 22–32)
Calcium: 8.1 mg/dL — ABNORMAL LOW (ref 8.9–10.3)
Calcium: 8 mg/dL — ABNORMAL LOW (ref 8.9–10.3)
Chloride: 101 mmol/L (ref 101–111)
Chloride: 104 mmol/L (ref 101–111)
Creatinine, Ser: 2.13 mg/dL — ABNORMAL HIGH (ref 0.61–1.24)
Creatinine, Ser: 1.86 mg/dL — ABNORMAL HIGH (ref 0.61–1.24)
GFR calc Af Amer: 34 mL/min — ABNORMAL LOW
GFR calc Af Amer: 40 mL/min — ABNORMAL LOW (ref >60)
GFR calc non Af Amer: 29 mL/min — ABNORMAL LOW
GFR calc non Af Amer: 35 mL/min — ABNORMAL LOW (ref >60)
GLUCOSE: 116 mg/dL — AB (ref 65–99)
Glucose, Bld: 112 mg/dL — ABNORMAL HIGH (ref 65–99)
Phosphorus: 3.3 mg/dL (ref 2.5–4.6)
Phosphorus: 2.9 mg/dL (ref 2.5–4.6)
Potassium: 4.1 mmol/L (ref 3.5–5.1)
Potassium: 4.2 mmol/L (ref 3.5–5.1)
SODIUM: 135 mmol/L (ref 135–145)
Sodium: 134 mmol/L — ABNORMAL LOW (ref 135–145)

## 2016-06-06 LAB — VANCOMYCIN, RANDOM: Vancomycin Rm: 23

## 2016-06-06 LAB — MAGNESIUM: Magnesium: 2.2 mg/dL (ref 1.7–2.4)

## 2016-06-06 LAB — PROCALCITONIN: Procalcitonin: 4.22 ng/mL

## 2016-06-06 MED ORDER — FUROSEMIDE 10 MG/ML PO SOLN
160.0000 mg | Freq: Three times a day (TID) | ORAL | Status: DC
  Administered 2016-06-06 – 2016-06-08 (×7): 160 mg
  Filled 2016-06-06 (×7): qty 16

## 2016-06-06 MED ORDER — ANTICOAGULANT SODIUM CITRATE 4% (200MG/5ML) IV SOLN
5.0000 mL | Status: DC | PRN
  Administered 2016-06-06 – 2016-06-07 (×2): 2.4 mL via INTRAVENOUS_CENTRAL
  Filled 2016-06-06 (×4): qty 250

## 2016-06-06 MED ORDER — PRO-STAT SUGAR FREE PO LIQD
60.0000 mL | Freq: Three times a day (TID) | ORAL | Status: DC
  Administered 2016-06-06 – 2016-06-20 (×39): 60 mL
  Filled 2016-06-06 (×39): qty 60

## 2016-06-06 MED ORDER — VANCOMYCIN HCL 10 G IV SOLR
1500.0000 mg | INTRAVENOUS | Status: DC
  Administered 2016-06-06: 1500 mg via INTRAVENOUS
  Filled 2016-06-06 (×2): qty 1500

## 2016-06-06 MED ORDER — OSMOLITE 1.5 CAL PO LIQD
1000.0000 mL | ORAL | Status: DC
  Administered 2016-06-06 – 2016-06-19 (×15): 1000 mL
  Filled 2016-06-06 (×18): qty 1000

## 2016-06-06 NOTE — Progress Notes (Signed)
PCCM Progress Note  Admission date: 05/23/2016 Referring provider: Dr. Clarene Duke, [facility] ER  CC: altered mental status  HPI: 71 y/o male from [facility] admitted 3/5 with altered mental status with aspiration, sepsis, hypoxic/hypercapnic respiratory failure. Course c/b retroperitoneal bleed on heparin , shock, acute renal failure & prolonged ventilation requiring tracheostomy    has a past medical history of Asthma; Atrial fibrillation (HCC); Gout; Hypertension; Morbid obesity (HCC); Multinodular goiter; Osteoarthrosis and allied disorders; Other and unspecified hyperlipidemia; Stroke [facility]); Thrombocytopenia (HCC); and Vitamin D deficiency.   Antibiotics: Zosyn 3/06 >> 3/08 Rocephin (H.Flu PNA) 3/08 >> 3/14 Vancomycin 3/23>>>3/28>>>4/1>>> Cefepime 3/23>>>4/1 Merrem 4/1>>>  Cultures: Urine 3/05 >> negative Blood 3/06 >> negative Resp 3/6 >> H flu (b-lactamase +) Trach stoma site 3/23 >> diphtheroids resp cx 3/28 >>nl flora  Lines/tubes: ETT 3/05 >> 3/8 ETT 3/10 >> 3/21 Trach (Dr Jenne Pane) 3/21 >>  Lt IJ CVL 3/07 >> 3/27 R HD catheter  >> 3/22 R IJ HD catheter 3/23 >>   Events: 3/05  Transfer to [facility] Sputum 3/06 >> Haemophilus influenzae (beta lactamase positive) Echo 3/06 >> EF 65 to 70%, mod LVH, mod TR, PAS 39 mmHg CT head 3/7 >> no acute abnormality  3/27 rt humerus fracture identified - ortho consult 3/27 RUQ [place] .Marland Kitchen Not helpful due to body habitus 3/29 - Chronic  critically ill On ATC since 3/27  with copious secretions c/o RUE pain decreased Liquid stool  3/30 - doing ATC x 24-48h per RN. Rt shoulder pain only when touched per RN. Making urine. Off CRRT. Not on pressors. Family not fully on board about LTAC per RN but looking at [facility]. Needs LTAC.   SUBJECTIVE/OVERNIGHT/INTERVAL HX Tolerating TC this AM but BP too soft for HD, continuing with CRRT  Vital signs: BP (!) 91/56   Pulse (!) 106   Temp 97.7 F (36.5 C) (Oral)   Resp 18   Ht  (1.676 m)   Wt (!) 151.5 kg  (334 lb)   SpO2 100%   BMI 53.91 kg/m   Intake/output:  Intake/Output Summary (Last 24 hours) at 06/06/16 0752 Last data filed at 06/06/16 0700  Gross per 24 hour  Intake          2206.01 ml  Output             4237 ml  Net         -2030.99 ml   I/O last 3 completed shifts: In: 4209.6 [I.V.:1130.4; Blood:359.2; NG/GT:2520; IV Piggyback:200] Out: 4437 [Urine:455; Other:3982]  EXAM General: acute and chronically ill appearing male, sedate but interactive. HEENT: [place]/AT, PERRL, EOM-I and MMM Heart: RRR, Nl S1/S2, -M/R/G. Lung: Coarse BS diffusely Abdomen: Soft, NT, ND and +BS. Ext: 1+ edema and -tenderness Neuro: awake and interactive  LABS  PULMONARY  Recent Labs Lab 06/04/16 1550  PHART 7.387  PCO2ART 46.5  PO2ART 89.2  HCO3 26.9  O2SAT 96.5   CBC  Recent Labs Lab 06/04/16 2240 06/05/16 0549 06/06/16 0420  HGB 7.9* 7.8* 8.1*  HCT 24.6* 24.0* 25.2*  WBC 6.6 5.8 5.0  PLT 76* 70* 50*   COAGULATION  Recent Labs Lab 06/01/16 1801  INR 1.32   CARDIAC  No results for input(s): TROPONINI in the last 168 hours. No results for input(s): PROBNP in the last 168 hours.  CHEMISTRY  Recent Labs Lab 06/02/16 0409 06/03/16 0305 06/04/16 0442 06/04/16 2040 06/05/16 0549 06/05/16 1535 06/06/16 0500  NA 138 139 135 136 134* 135 134*  K 4.1  4.5 3.6 4.4 4.5 4.6 4.1  CL 101 101 97* 98* 98* 100* 101  CO2 GLUCOSE 127* 126* 126* 146* 125* 134* 112*  BUN 139* 158* 96* 121* 131* 96* 64*  CREATININE 3.73* 3.96* 2.77* 3.65* 3.96* 3.01* 2.13*  CALCIUM 8.3* 8.4* 7.8* 8.2* 8.3* 8.1* 8.1*  MG 1.9 2.0 1.8  --  2.0  --  2.2  PHOS 3.8 4.6 3.4  --  5.0* 4.4 3.3   Estimated Creatinine Clearance: 43.9 mL/min (A) (by C-G formula based on SCr of 2.13 mg/dL (H)).  LIVER  Recent Labs Lab 06/01/16 1801  06/03/16 0305 06/04/16 0442 06/05/16 0549 06/05/16 1535 06/06/16 0500  ALBUMIN  --   < > 1.9* 1.9* 1.9* 2.0* 2.0*  INR 1.32  --   --   --   --    --   --   < > = values in this interval not displayed. INFECTIOUS  Recent Labs Lab 06/04/16 1350 06/05/16 0549 06/06/16 0500  LATICACIDVEN 1.3  --   --   PROCALCITON 6.13 6.68 4.22   ENDOCRINE CBG (last 3)   Recent Labs  06/05/16 1914 06/06/16 0333 06/06/16 0731  GLUCAP 106* 110* 105*   IMAGING x48h  - image(s) personally visualized  -   highlighted in bold Ct Head Wo Contrast  Result Date: 06/04/2016 CLINICAL DATA:  Altered mental status EXAM: CT HEAD WITHOUT CONTRAST TECHNIQUE: Contiguous axial images were obtained from the base of the skull through the vertex without intravenous contrast. COMPARISON:  05/10/2016, 05/09/2016. FINDINGS: Brain: There is no intracranial hemorrhage, mass or evidence of acute infarction. There is moderate generalized atrophy. There is mild chronic microvascular ischemic change. There is no significant extra-axial fluid collection. No acute intracranial findings are evident. Vascular: No hyperdense vessel or unexpected calcification. Skull: Normal. Negative for fracture or focal lesion. Sinuses/Orbits: Paranasal sinuses are clear. Unchanged venous varices of the orbits. Other: None. IMPRESSION: No acute intracranial findings. There is moderate generalized atrophy and chronic appearing white matter hypodensities which likely represent small vessel ischemic disease. Electronically Signed   By: Ellery Plunk M.D.   On: 06/04/2016 22:29   Dg Shoulder Right Port  Result Date: 06/05/2016 CLINICAL DATA:  Proximal humeral fracture. EXAM: PORTABLE RIGHT SHOULDER COMPARISON:  Chest x-ray 06/01/2016.  Humerus series 3 09/22/2016. FINDINGS: Fracture of the proximal right humerus again noted without interim change. Minimal displacement on single portable view. IMPRESSION: Stable appearing fracture of the proximal right humerus. Electronically Signed   By: Maisie Fus  Register   On: 06/05/2016 07:00   DISCUSSION: 71 year old man, history of fibrillation, obesity,  admitted with respiratory failure and septic shock in the setting of H flu pneumonia. Subsequent course complicated by retroperitoneal bleeding on anticoagulation, hemorrhagic shock, acute respiratory failure and acute renal failure. s/p trach , tolerating ATC daytime, off CVVHD.  Pressors off  Unfortunately has rt humerus fracture -conservative mx  ASSESSMENT / PLAN:  PULMONARY A: Acute respiratory failure with chronoic resp failure - ATC since 05/30/16 Pneumonia, H influenza (beta lactamase positive) Presumed OSA/OHS History of asthma Bilateral pulm infiltrates / effusions, consistent w volume overload  P:   - TC as tolerated - Titrate O2 for sat of 88-92%  CARDIOVASCULAR A:  Shock, principally hemorrhagic.  Paroxysmal atrial fibrillation History of hypertension  PLAN - D/C anti-HTN - D/C levophed - Cortisol level 42.6 - D/C stress dose steroids  RENAL I/O last 3 completed shifts: In: 4209.6 [I.V.:1130.4; Blood:359.2; NG/GT:2520; IV  Piggyback:200] Out: 4437 [Urine:455; Other:3982] Lab Results  Component Value Date   CREATININE 2.13 (H) 06/06/2016   CREATININE 3.01 (H) 06/05/2016   CREATININE 3.96 (H) 06/05/2016   CREATININE 0.80 07/16/2012   A:   Acute renal failure, presumed due to ATN in the setting of hemorrhagic shock  3/30 - creat rising but making urine 3/31 - needs HD  06/04/16 - look ddru  P:   - KVO IVF - Replace electrolytes as indicated - BMET in AM - CRRT negative 100 ml/hr - Minimize intake as able.  GASTROINTESTINAL A:   Large retroperitoneal bleed noted, some slight increased 3/18 CT scan, hemoglobin currently stable Shock liver, transaminitis, improving Hepatic lesion; noted on CT scan of the abdomen 3/18. 5 cm lesion of the dome of the liver, could represent blood or infection or infarct.  Protein calorie malnutrition  P:   - Continue PPI - Continue tube feeds - Consent for G-tube signed, will ask IR to place G-tube if  able.  HEMATOLOGIC  Recent Labs Lab 06/04/16 2240 06/05/16 0549 06/06/16 0420  HGB 7.9* 7.8* 8.1*  HCT 24.6* 24.0* 25.2*  WBC 6.6 5.8 5.0  PLT 76* 70* 50*   A:   Acute blood loss anemia, improved Severe heparin sensitivity, associated with hemorrhage Thrombocytopenia, acute on chronic -came in low at 140k  P:  - No heparin - SCD for DVT prophylaxis - CBC in AM - Transfuse per ICU protocol  INFECTIOUS A:   H. influenzae pneumonia, treated HCAP Septic shock ? Clinical significance hepatic lesion, ? Possible abscess   P:   - Antibiotics for pneumonia were completed - Changed to Vanc and Merrem 06/04/16, will continue for now - Pan culture - Procal 4.22  ENDOCRINE CBG (last 3)   Recent Labs  06/05/16 1914 06/06/16 0333 06/06/16 0731  GLUCAP 106* 110* 105*   A:   Hyperglycemia, on steroids Hypothyroidism P:   - Continue SSI, CBG - Continue Synthroid  NEUROLOGIC A:   Toxic metabolic encephalopathy, starting to clear his mental status History of CVA with residual deficits Rt humerus fracture -   3/31 - follows comands 4/1 - more tired due to sepsis P:   - PT consult - LTAC recommended and family refused - RUE sling - Morphine prn pain  MSK A Rt shoulder fracture during hospital stay P Sling Ortho saw   FAMILY DISCUSSIONS: have been very hostile with daughter but very pleasant with patient and granddaughter who is DPOA.  No further attempts at communications were made, please see previous notes.  - Inter-disciplinary family meet or Palliative Care meeting due by:  done  CODE STATUS Full Code  The patient is critically ill with multiple organ systems failure and requires high complexity decision making for assessment and support, frequent evaluation and titration of therapies, application of advanced monitoring technologies and extensive interpretation of multiple databases.   Critical Care Time devoted to patient care services described in  this note is  35  Minutes. This time reflects time of care of this signee Dr Koren Bound. This critical care time does not reflect procedure time, or teaching time or supervisory time of PA/NP/Med student/Med Resident etc but could involve care discussion time.  Alyson Reedy, M.D. [facility] Pulmonary/Critical Care Medicine. Pager: [phone]. After hours pager: [phone].   06/06/2016 7:52 AM

## 2016-06-06 NOTE — Progress Notes (Signed)
Nutrition Follow-up  DOCUMENTATION CODES:   Morbid obesity  INTERVENTION:    Change TF to Osmolite 1.5 at 40 ml/h (93m per day)  Increase Pro-stat to 60 ml TID  Provides 2040kcal, 150gm protein, 7346mfree water daily  NUTRITION DIAGNOSIS:   Inadequate oral intake related to inability to eat as evidenced by NPO status.  Ongoing  GOAL:   Provide needs based on ASPEN/SCCM guidelines  Met  MONITOR:   Vent status, TF tolerance, I & O's, Labs  ASSESSMENT:   [age]/o male from [facility] admitted 3/5 with altered mental status with aspiration, sepsis, hypoxic/hypercapnic respiratory failure.    Discussed patient with RN today. MD requested decrease in TF rate to decrease overall volume intake. CRRT restarted 4/2; did not tolerate IHD over the weekend. 4/1 I/O balance + 4.1 L 4/2 I/O balance + 3.8 L S/P tracheostomy 3/21. Currently on trach collar.  SLP following for PMV use. Cortrak tube placed 3/22; tip in the stomach.  Currently receiving Vital AF 1.2 at 70 ml/h (1680 ml per day) with Prostat 30 ml BID to provide 2216kcal, 156gm protein, 136267mree water daily.  Labs reviewed. Sodium 134 (L), potassium & phosphorus WNL CBG's: 110-105-87 Medications reviewed.  Diet Order:  Diet NPO time specified  Skin:  Reviewed, no issues  Last BM:  4/3 (rectal tube)  Height:   Ht Readings from Last 1 Encounters:  05/09/16 '5\' 6"'  (1.676 m)    Weight:   Wt Readings from Last 1 Encounters:  06/06/16 (!) 334 lb (151.5 kg)    Ideal Body Weight:  64.5 kg  BMI:  Body mass index is 53.91 kg/m.  Estimated Nutritional Needs:   Kcal:  2175  Protein:  150 gm  Fluid:  2.2-2.4 L  EDUCATION NEEDS:   No education needs identified at this time  KimMolli BarrowsD,[place] [phone] Hours Pager [phone]

## 2016-06-06 NOTE — Progress Notes (Signed)
Pharmacy Antibiotic Note  Omar [name] is a 71 y.o. male s/p multiple rounds of abx for pneumonia, trach stomatitis and now re-broadening abx on 4/1 due to new VAP. Patient is afebrile with stable wbc wnl. CRRT was restarted yesterday without interruptions. Vancomycin random level this morning (0500) was 23 (supratherapeutic). Estimated vancomycin half-life is 23.7 hours.   Plan: -Meropenem 1 g IV q12h -Vancomycin 1500 mg IV q24h (starting at 1200 today) -F/u repeat cultures   Height:  (167.6 cm) Weight: (!) 334 lb (151.5 kg) IBW/kg (Calculated) : 63.8  Temp (24hrs), Avg:97.6 F (36.4 C), Min:97.2 F (36.2 C), Max:97.9 F (36.6 C)   Recent Labs Lab 05/31/16 0920  06/04/16 0442 06/04/16 1350 06/04/16 2040 06/04/16 2240 06/05/16 0549 06/05/16 1535 06/06/16 0420 06/06/16 0500  WBC  --   < > 5.2 5.0  --  6.6 5.8  --  5.0  --   CREATININE  --   < > 2.77*  --  3.65*  --  3.96* 3.01*  --  2.13*  LATICACIDVEN  --   --   --  1.3  --   --   --   --   --   --   VANCORANDOM 37  --   --   --   --   --   --   --  23  --   < > = values in this interval not displayed.  Estimated Creatinine Clearance: 43.9 mL/min (A) (by C-G formula based on SCr of 2.13 mg/dL (H)).    Allergies  Allergen Reactions  . Anacin Af [Acetaminophen]   . Heparin     Retroperitoneal bleed in 2018, previous bleed from heparin in 2013  . Niaspan [Niacin Er]     Antimicrobials this admission: Zosyn 3/6 >> 3/8 CTX 3/8 >> 3/14 Cefepime 3/23 >> 4/1 Vancomycin 3/23 >> 3/28, 4/1 > Meropenem 4/1 >  Dose adjustments this admission:  3/28 Vanc random = 37 4/2 Vanc random = 23  Microbiology results: 3/6 MRSA PCR >>negative 3/6 Blood >> negative 3/5 Urine >> negative 3/6 Resp >> H. Influenzae, beta lactamase positive 3/23 Resp >> Diptheroids 3/28 Resp >> NF 4/1 Blood >> ngtd 4/1 Resp >> 4/1 Urine >>  Allie Bossier, PharmD PGY1 Pharmacy Resident [phone] (Pager) 06/06/2016 8:49 AM

## 2016-06-06 NOTE — Progress Notes (Signed)
PCCM Progress Note  Admission date: 05/27/2016 Referring provider: Dr. Clarene Duke, [facility] ER  CC: altered mental status  HPI: 71 y/o male from [facility] admitted 3/5 with altered mental status + H.flu PNA with aspiration, sepsis, hypoxic/hypercapnic respiratory failure. Course c/b retroperitoneal bleed while on heparin with subsequent shock, acute renal failure & prolonged ventilation requiring tracheostomy.   has a past medical history of Asthma; Atrial fibrillation (HCC); Gout; Hypertension; Morbid obesity (HCC); Multinodular goiter; Osteoarthrosis and allied disorders; Other and unspecified hyperlipidemia; Stroke [facility]); Thrombocytopenia (HCC); and Vitamin D deficiency.  Antibiotics: Zosyn 3/06 >> 3/08 Rocephin (H.Flu PNA) 3/08 >> 3/14 Vancomycin 3/23 >> 3/28 Cefepime 3/23 >>   Cultures: Urine 3/05 >> negative Blood 3/06 >> negative Resp 3/6 >> H flu (b-lactamase +) Trach stoma site 3/23 >> diphtheroids Resp cx 3/28 >>nl flora  Lines/tubes: ETT 3/05 >> 3/8 ETT 3/10 >> 3/21 Trach (Dr Jenne Pane) 3/21 >>  Lt IJ CVL 3/07 >> 3/27 R HD catheter  >> 3/22 R IJ HD catheter 3/23 >>   Events: 3/05  Transfer to [facility] Sputum 3/06 >> Haemophilus influenzae (beta lactamase positive) Echo 3/06 >> EF 65 to 70%, mod LVH, mod TR, PAS 39 mmHg CT head 3/7 >> no acute abnormality  3/10 - Large retroperitoneal bleed noted on CT after c/o back pain/leg pain 3/27 rt humerus fracture identified - ortho consult 3/27 RUQ [place] .Marland Kitchen Not helpful due to body habitus 3/29 - Chronic  critically ill On ATC since 3/27  with copious secretions c/o RUE pain decreased Liquid stool  3/27 - end CRRT  3/30 - doing ATC x 24-48h per RN. Rt shoulder pain only when touched per RN. Making urine. Off CRRT. Not on pressors. Family not fully on board about LTAC per RN but looking at [facility]. Needs LTAC.   SUBJECTIVE/OVERNIGHT/INTERVAL HX Did not work with SLP yest Cortisol 42  Off pressors Volume up On trach collar  Vital  signs: BP (!) 92/55   Pulse (!) 107   Temp 97.7 F (36.5 C) (Oral)   Resp 17   Ht  (1.676 m)   Wt (!) 334 lb (151.5 kg)   SpO2 100%   BMI 53.91 kg/m   Intake/output:  Intake/Output Summary (Last 24 hours) at 06/06/16 0740 Last data filed at 06/06/16 0700  Gross per 24 hour  Intake          2206.01 ml  Output             4237 ml  Net         -2030.99 ml   I/O last 3 completed shifts: In: 4209.6 [I.V.:1130.4; Blood:359.2; NG/GT:2520; IV Piggyback:200] Out: 4437 [Urine:455; Other:3982]  EXAM General: [name]. Obese HENT: [place]/AT. No obv abnormality. EOMI. +TRACH Lungs: Coarse breath sounds throughout Chest wall: not tender to palp Heart: RRR. On pressors Abdomen: soft, non tender, not distended. +bs Extremities: 3+ edema BL Neuro: Alert and interactive. Moves all extremities.   LABS  PULMONARY  Recent Labs Lab 06/04/16 1550  PHART 7.387  PCO2ART 46.5  PO2ART 89.2  HCO3 26.9  O2SAT 96.5    CBC  Recent Labs Lab 06/04/16 2240 06/05/16 0549 06/06/16 0420  HGB 7.9* 7.8* 8.1*  HCT 24.6* 24.0* 25.2*  WBC 6.6 5.8 5.0  PLT 76* 70* 50*    COAGULATION  Recent Labs Lab 06/01/16 1801  INR 1.32    CARDIAC  No results for input(s): TROPONINI in the last 168 hours. No results for input(s): PROBNP in the  last 168 hours.   CHEMISTRY  Recent Labs Lab 06/02/16 0409 06/03/16 0305 06/04/16 0442 06/04/16 2040 06/05/16 0549 06/05/16 1535 06/06/16 0500  NA 138 139 135 136 134* 135 134*  K 4.1 4.5 3.6 4.4 4.5 4.6 4.1  CL 101 101 97* 98* 98* 100* 101  CO2 GLUCOSE 127* 126* 126* 146* 125* 134* 112*  BUN 139* 158* 96* 121* 131* 96* 64*  CREATININE 3.73* 3.96* 2.77* 3.65* 3.96* 3.01* 2.13*  CALCIUM 8.3* 8.4* 7.8* 8.2* 8.3* 8.1* 8.1*  MG 1.9 2.0 1.8  --  2.0  --  2.2  PHOS 3.8 4.6 3.4  --  5.0* 4.4 3.3   Estimated Creatinine Clearance: 43.9 mL/min (A) (by C-G formula based on SCr of 2.13 mg/dL  (H)).  LIVER  Recent Labs Lab 06/01/16 1801  06/03/16 0305 06/04/16 0442 06/05/16 0549 06/05/16 1535 06/06/16 0500  ALBUMIN  --   < > 1.9* 1.9* 1.9* 2.0* 2.0*  INR 1.32  --   --   --   --   --   --   < > = values in this interval not displayed. INFECTIOUS  Recent Labs Lab 06/04/16 1350 06/05/16 0549 06/06/16 0500  LATICACIDVEN 1.3  --   --   PROCALCITON 6.13 6.68 4.22     ENDOCRINE CBG (last 3)   Recent Labs  06/05/16 1914 06/06/16 0333 06/06/16 0731  GLUCAP 106* 110* 105*    IMAGING x48h  - image(s) personally visualized  -   highlighted in bold Ct Head Wo Contrast  Result Date: 06/04/2016 CLINICAL DATA:  Altered mental status EXAM: CT HEAD WITHOUT CONTRAST TECHNIQUE: Contiguous axial images were obtained from the base of the skull through the vertex without intravenous contrast. COMPARISON:  05/10/2016, 05/14/2016. FINDINGS: Brain: There is no intracranial hemorrhage, mass or evidence of acute infarction. There is moderate generalized atrophy. There is mild chronic microvascular ischemic change. There is no significant extra-axial fluid collection. No acute intracranial findings are evident. Vascular: No hyperdense vessel or unexpected calcification. Skull: Normal. Negative for fracture or focal lesion. Sinuses/Orbits: Paranasal sinuses are clear. Unchanged venous varices of the orbits. Other: None. IMPRESSION: No acute intracranial findings. There is moderate generalized atrophy and chronic appearing white matter hypodensities which likely represent small vessel ischemic disease. Electronically Signed   By: Ellery Plunk M.D.   On: 06/04/2016 22:29   Dg Shoulder Right Port  Result Date: 06/05/2016 CLINICAL DATA:  Proximal humeral fracture. EXAM: PORTABLE RIGHT SHOULDER COMPARISON:  Chest x-ray 06/01/2016.  Humerus series 3 09/22/2016. FINDINGS: Fracture of the proximal right humerus again noted without interim change. Minimal displacement on single portable view.  IMPRESSION: Stable appearing fracture of the proximal right humerus. Electronically Signed   By: Maisie Fus  Register   On: 06/05/2016 07:00    DISCUSSION: 71 year old man, history of fibrillation, obesity, admitted with respiratory failure and septic shock in the setting of H flu pneumonia. Subsequent course complicated by retroperitoneal bleeding on anticoagulation, hemorrhagic shock, acute respiratory failure and acute renal failure. s/p trach. Currently back on vent and CRRT.  Unfortunately has rt humerus fracture -conservative mx  ASSESSMENT / PLAN:  PULMONARY A: Acute respiratory failure with chronoic resp failure - ATC since 05/30/16 Pneumonia, H influenza (beta lactamase positive) Presumed OSA/OHS History of asthma Bilateral pulm infiltrates / effusions, consistent w volume overload  06/04/16 Back on vent due to sepsis syndrome, likely HCAP  P:  Full vent support 4/1.   CARDIOVASCULAR A:  Shock, principally hemorrhagic however perhaps component of septic Paroxysmal atrial fibrillation History of hypertension  3/30 -   Norepinephrine off  Off  stress dose steroids 06/04/16 - hypotension 4/2 cortisol 42.6  PLAN S/p 1L bolus and now on pressors. Wean as tolerated. Cortisol level now then restarting stress-dose steroids.  D/c anti-htn  RENAL I/O last 3 completed shifts: In: 4209.6 [I.V.:1130.4; Blood:359.2; NG/GT:2520; IV Piggyback:200] Out: 4437 [Urine:455; Other:3982] Lab Results  Component Value Date   CREATININE 2.13 (H) 06/06/2016   CREATININE 3.01 (H) 06/05/2016   CREATININE 3.96 (H) 06/05/2016   CREATININE 0.80 07/16/2012   A:   Acute renal failure, presumed due to ATN in the setting of hemorrhagic shock 3/30 - creat rising but making urine 3/31 - needs HD  06/04/16 - look dry 4/3 - volume up  P: CRRT KVO IVF IV Lasix 160 TID per nephro recs Will try to reduce volume input  GASTROINTESTINAL A:   Large retroperitoneal bleed noted, some slight increased  3/18 CT scan, hemoglobin currently stable Shock liver, transaminitis, improving Hepatic lesion; noted on CT scan of the abdomen 3/18. 5 cm lesion of the dome of the liver, could represent blood or infection or infarct.  Protein calorie malnutrition - no evidence of active bleed  P:   Continue PPI Continue tube feeds Granddaughter considering PEG tube placement.  HEMATOLOGIC  Recent Labs Lab 06/04/16 2240 06/05/16 0549 06/06/16 0420  HGB 7.9* 7.8* 8.1*  HCT 24.6* 24.0* 25.2*  WBC 6.6 5.8 5.0  PLT 76* 70* 50*   A:   Acute blood loss anemia, improved Severe heparin sensitivity, associated with hemorrhage Thrombocytopenia, acute on chronic -came in low at 140k Aranesp 4/2 4/3 Hb 8.1  P:  Follow CBC, Goal hemoglobin greater than 7.0.  No heparin ever Hold anticoagulation  SCD for DVT prophylaxis  INFECTIOUS A:   H. influenzae pneumonia, treated HCAP Septic shock ? Clinical significance hepatic lesion, ? Possible abscess  3/30 - on abx. Not on pressors 06/04/16 - likely HCAP, started back on pressors  4/3 off pressors   P: Antibiotics for initial pneumonia were completed Cefepime, started 3/23, changed to Merrem 06/04/16 --> Pct4.2, no leukocytosis Pan culture: Tracheal asp with few GNR, GP cocci in pairs and clusters -- culture still pending  ENDOCRINE CBG (last 3)   Recent Labs  06/05/16 1914 06/06/16 0333 06/06/16 0731  GLUCAP 106* 110* 105*   A:   Hyperglycemia, on steroids Hypothyroidism P:   Continue SSI, CBG Continue Synthroid  NEUROLOGIC A:   Toxic metabolic encephalopathy, starting to clear his mental status History of CVA with residual deficits Rt humerus fracture  3/31 - follows comands 4/1 - more tired due to sepsis  P:   PT consult - LTAC recommended RUE sling Morphine prn pain   MSK A Rt shoulder fracture during hospital stay  P: sling + conserv management  FAMILY DISCUSSIONS: Case discussed with daughter via phone - demanding  transfer and acusing team of 'trying to kill her father' and using extremely inappropriate language throughout. Was again informed that we would be happy to transfer, but she needs to arrange.  LTAC recommended, granddaughter requests staying at [facility]  Inter-disciplinary family meet or Palliative Care meeting due by:  done  CODE STATUS Full Code  DISPO Keep in ICU given above issues  The patient is critically ill with multiple organ systems failure and requires high complexity decision making for assessment and support, frequent evaluation and titration of  therapies, application of advanced monitoring technologies and extensive interpretation of multiple databases.   Omar [name], D.O. Internal Medicine Resident PGY1 Pager [phone]

## 2016-06-06 NOTE — Progress Notes (Signed)
Subjective: Interval History: has no complaint, on trach collar.  Objective: Vital signs in last 24 hours: Temp:  [97.2 F (36.2 C)-98.7 F (37.1 C)] 97.9 F (36.6 C) (04/03 0334) Pulse Rate:  [94-130] 119 (04/03 0637) Resp:  [15-31] 17 (04/03 0637) BP: (79-118)/(51-73) 93/57 (04/03 0637) SpO2:  [95 %-100 %] 100 % (04/03 0637) FiO2 (%):  [28 %-30 %] 28 % (04/03 0308) Weight:  [151.5 kg (334 lb)] 151.5 kg (334 lb) (04/03 0358) Weight change: -0.907 kg (-2 lb)  Intake/Output from previous day: 04/02 0701 - 04/03 0700 In: 2126 [I.V.:316; NG/GT:1610; IV Piggyback:200] Out: 4072 [Urine:255] Intake/Output this shift: Total I/O In: 980 [I.V.:110; NG/GT:770; IV Piggyback:100] Out: 1988 [Urine:130; Other:1858]  General appearance: alert, cooperative, no distress and morbidly obese Neck: Trach , RIJ cath Resp: trach, RIJ cath. Cardio: irregularly irregular rhythm and systolic murmur: holosystolic 2/6, blowing at apex GI: massive, pos bs, soft, liver down 5 cm Extremities: edema 3-4+ Resp diffuse rhonchi Lab Results:  Recent Labs  06/05/16 0549 06/06/16 0420  WBC 5.8 5.0  HGB 7.8* 8.1*  HCT 24.0* 25.2*  PLT 70* 50*   BMET:  Recent Labs  06/05/16 1535 06/06/16 0500  NA 135 134*  K 4.6 4.1  CL 100* 101  CO2 26 26  GLUCOSE 134* 112*  BUN 96* 64*  CREATININE 3.01* 2.13*  CALCIUM 8.1* 8.1*   No results for input(s): PTH in the last 72 hours. Iron Studies: No results for input(s): IRON, TIBC, TRANSFERRIN, FERRITIN in the last 72 hours.  Studies/Results: Ct Head Wo Contrast  Result Date: 06/04/2016 CLINICAL DATA:  Altered mental status EXAM: CT HEAD WITHOUT CONTRAST TECHNIQUE: Contiguous axial images were obtained from the base of the skull through the vertex without intravenous contrast. COMPARISON:  05/10/2016, 05-17-2016. FINDINGS: Brain: There is no intracranial hemorrhage, mass or evidence of acute infarction. There is moderate generalized atrophy. There is mild  chronic microvascular ischemic change. There is no significant extra-axial fluid collection. No acute intracranial findings are evident. Vascular: No hyperdense vessel or unexpected calcification. Skull: Normal. Negative for fracture or focal lesion. Sinuses/Orbits: Paranasal sinuses are clear. Unchanged venous varices of the orbits. Other: None. IMPRESSION: No acute intracranial findings. There is moderate generalized atrophy and chronic appearing white matter hypodensities which likely represent small vessel ischemic disease. Electronically Signed   By: Ellery Plunk M.D.   On: 06/04/2016 22:29   Dg Shoulder Right Port  Result Date: 06/05/2016 CLINICAL DATA:  Proximal humeral fracture. EXAM: PORTABLE RIGHT SHOULDER COMPARISON:  Chest x-ray 06/01/2016.  Humerus series 3 09/22/2016. FINDINGS: Fracture of the proximal right humerus again noted without interim change. Minimal displacement on single portable view. IMPRESSION: Stable appearing fracture of the proximal right humerus. Electronically Signed   By: Maisie Fus  Register   On: 06/05/2016 07:00    I have reviewed the patient's current medications.  Assessment/Plan: 1 AKI/CKD  Vol xs yet. tol vol off. Receiving over 100cc/h .Need to limit intake. Acid/base/k/solute ok.  . Now making some urine..will see if can stimulate.  2 Anemia stable, esa 3 Resp off vent, trach now.  Asp on Merum. 4 massive obesity 5 DM controlled 6 OSA  7 Gout 8 thrombocytopenia 9 hypthyroid 10 CVA P Lasix, minimize vol, will need pc soon. Cont Merum, Lower vol TF and IVs   LOS: 29 days   Francy Mcilvaine L 06/06/2016,6:53 AM

## 2016-06-06 NOTE — Progress Notes (Addendum)
Patient ID: Omar [name], male   DOB: Dec 15, 1945, 71 y.o.   MRN: [id number]  Request for Percutabneous gastric tube received  Dr Lowella Dandy has reviewed imaging and chart Will need new CT to fully re evaluate anatomy--- Possible ascites and window for procedure Colon is directly over stomach in CT 05/30/16  CT ordered

## 2016-06-07 LAB — GLUCOSE, CAPILLARY
GLUCOSE-CAPILLARY: 90 mg/dL (ref 65–99)
Glucose-Capillary: 106 mg/dL — ABNORMAL HIGH (ref 65–99)
Glucose-Capillary: 107 mg/dL — ABNORMAL HIGH (ref 65–99)
Glucose-Capillary: 93 mg/dL (ref 65–99)
Glucose-Capillary: 93 mg/dL (ref 65–99)
Glucose-Capillary: 97 mg/dL (ref 65–99)

## 2016-06-07 LAB — RENAL FUNCTION PANEL
ALBUMIN: 1.8 g/dL — AB (ref 3.5–5.0)
ALBUMIN: 1.7 g/dL — AB (ref 3.5–5.0)
ANION GAP: 5 (ref 5–15)
Anion gap: 5 (ref 5–15)
BUN: 36 mg/dL — ABNORMAL HIGH (ref 6–20)
BUN: 41 mg/dL — ABNORMAL HIGH (ref 6–20)
CALCIUM: 7.8 mg/dL — AB (ref 8.9–10.3)
CHLORIDE: 103 mmol/L (ref 101–111)
CO2: 28 mmol/L (ref 22–32)
CO2: 27 mmol/L (ref 22–32)
CREATININE: 1.45 mg/dL — AB (ref 0.61–1.24)
CREATININE: 1.83 mg/dL — AB (ref 0.61–1.24)
Calcium: 7.9 mg/dL — ABNORMAL LOW (ref 8.9–10.3)
Chloride: 103 mmol/L (ref 101–111)
GFR calc non Af Amer: 35 mL/min — ABNORMAL LOW (ref >60)
GFR, EST AFRICAN AMERICAN: 54 mL/min — AB (ref >60)
GFR, EST AFRICAN AMERICAN: 41 mL/min — AB (ref >60)
GFR, EST NON AFRICAN AMERICAN: 47 mL/min — AB (ref >60)
GLUCOSE: 104 mg/dL — AB (ref 65–99)
Glucose, Bld: 114 mg/dL — ABNORMAL HIGH (ref 65–99)
PHOSPHORUS: 2.7 mg/dL (ref 2.5–4.6)
Phosphorus: 2.8 mg/dL (ref 2.5–4.6)
Potassium: 4 mmol/L (ref 3.5–5.1)
Potassium: 4.3 mmol/L (ref 3.5–5.1)
SODIUM: 135 mmol/L (ref 135–145)
Sodium: 136 mmol/L (ref 135–145)

## 2016-06-07 LAB — CBC WITH DIFFERENTIAL/PLATELET
Basophils Absolute: 0 10*3/uL (ref 0.0–0.1)
Basophils Relative: 0 %
EOS PCT: 5 %
Eosinophils Absolute: 0.2 10*3/uL (ref 0.0–0.7)
HEMATOCRIT: 25 % — AB (ref 39.0–52.0)
HEMOGLOBIN: 7.7 g/dL — AB (ref 13.0–17.0)
Lymphocytes Relative: 14 %
Lymphs Abs: 0.6 10*3/uL — ABNORMAL LOW (ref 0.7–4.0)
MCH: 33.5 pg (ref 26.0–34.0)
MCHC: 30.8 g/dL (ref 30.0–36.0)
MCV: 108.7 fL — ABNORMAL HIGH (ref 78.0–100.0)
MONOS PCT: 7 %
Monocytes Absolute: 0.3 10*3/uL (ref 0.1–1.0)
NEUTROS PCT: 74 %
Neutro Abs: 3 10*3/uL (ref 1.7–7.7)
Platelets: 45 10*3/uL — ABNORMAL LOW (ref 150–400)
RBC: 2.3 MIL/uL — AB (ref 4.22–5.81)
WBC: 4.1 10*3/uL (ref 4.0–10.5)

## 2016-06-07 LAB — CULTURE, RESPIRATORY W GRAM STAIN

## 2016-06-07 LAB — CULTURE, RESPIRATORY: CULTURE: NORMAL

## 2016-06-07 LAB — MAGNESIUM: Magnesium: 2.1 mg/dL (ref 1.7–2.4)

## 2016-06-07 LAB — PREALBUMIN: Prealbumin: 8.6 mg/dL — ABNORMAL LOW (ref 18–38)

## 2016-06-07 MED ORDER — CHLORHEXIDINE GLUCONATE CLOTH 2 % EX PADS
6.0000 | MEDICATED_PAD | Freq: Once | CUTANEOUS | Status: AC
  Administered 2016-06-07: 6 via TOPICAL

## 2016-06-07 MED ORDER — DARBEPOETIN ALFA 200 MCG/0.4ML IJ SOSY: 200.0000 ug | PREFILLED_SYRINGE | INTRAMUSCULAR | Status: DC

## 2016-06-07 MED ORDER — CEFOTETAN DISODIUM 2 G IJ SOLR
2.0000 g | INTRAMUSCULAR | Status: DC
  Filled 2016-06-07 (×2): qty 2

## 2016-06-07 MED ORDER — SODIUM CHLORIDE 0.9 % IV SOLN
500.0000 mg | INTRAVENOUS | Status: AC
  Administered 2016-06-08 – 2016-06-11 (×4): 500 mg via INTRAVENOUS
  Filled 2016-06-07 (×5): qty 0.5

## 2016-06-07 NOTE — Progress Notes (Signed)
Patient ID: Omar [name], male   DOB: October 31, 1945, 71 y.o.   MRN: [id number]  New imaging performed last pm CT 4/3: IMPRESSION: High positioned stomach beneath the left subcostal margin and close proximity to the transverse colon. Similar to the 05/20/2016 exam. See above comment. Recommend surgical referral for gastrostomy insertion. Bibasilar mild atelectasis/ consolidation and trace pleural effusions. Cholelithiasis Stable large right retroperitoneal hematoma Aortoiliac atherosclerosis  IR unable to safely access stomach percutaneously Pleas seek Surgical referral. Thank You

## 2016-06-07 NOTE — Progress Notes (Signed)
Subjective: Interval History: has no complaint .  Objective: Vital signs in last 24 hours: Temp:  [97.6 F (36.4 C)-99.5 F (37.5 C)] 99 F (37.2 C) (04/04 0406) Pulse Rate:  [25-132] 111 (04/04 0600) Resp:  [16-38] 20 (04/04 0600) BP: (76-113)/(45-64) 103/51 (04/04 0600) SpO2:  [92 %-100 %] 100 % (04/04 0600) FiO2 (%):  [28 %] 28 % (04/04 0335) Weight:  [154.2 kg (340 lb)] 154.2 kg (340 lb) (04/04 0415) Weight change: 2.722 kg (6 lb)  Intake/Output from previous day: 04/03 0701 - 04/04 0700 In: 2090 [NG/GT:1390; IV Piggyback:700] Out: 4624 [Urine:30; Stool:1150] Intake/Output this shift: Total I/O In: 540 [NG/GT:440; IV Piggyback:100] Out: 1849 [Urine:10; UEAVW:0981; Stool:550]  General appearance: cooperative, no distress and morbidly obese Neck: trach, R IJ cath Resp: diminished breath sounds bilaterally, rales bibasilar and rhonchi bibasilar Cardio: irregularly irregular rhythm and systolic murmur: holosystolic 2/6, blowing at apex GI: massive, pos bs,  Extremities: edema 3+  Lab Results:  Recent Labs  06/06/16 0420 06/07/16 0435  WBC 5.0 4.1  HGB 8.1* 7.7*  HCT 25.2* 25.0*  PLT 50* 45*   BMET:  Recent Labs  06/06/16 1600 06/07/16 0435  NA 135 136  K 4.2 4.0  CL 104 103  CO2 26 28  GLUCOSE 116* 114*  BUN 46* 36*  CREATININE 1.86* 1.45*  CALCIUM 8.0* 7.9*   No results for input(s): PTH in the last 72 hours. Iron Studies: No results for input(s): IRON, TIBC, TRANSFERRIN, FERRITIN in the last 72 hours.  Studies/Results: Ct Abdomen Wo Contrast  Result Date: 06/06/2016 CLINICAL DATA:  Dysphagia, assess stomach anatomy for possible percutaneous gastrostomy placement. EXAM: CT ABDOMEN WITHOUT CONTRAST TECHNIQUE: Multidetector CT imaging of the abdomen was performed following the standard protocol without IV contrast. COMPARISON:  05/20/2016 FINDINGS: Lower chest: Limited with motion artifact. Bibasilar atelectasis and mild consolidation, worse on the right  with trace pleural effusions. Heart is mildly enlarged. No pericardial effusion. No hiatal hernia. Hepatobiliary: No significant focal hepatic abnormality. Atrophy noted of the left hepatic lobe chronically. No biliary obstruction. Gallstones layer within the mildly distended gallbladder. Common bile duct is nondilated. Pancreas: Limited assessment.  No pancreatic ductal dilatation. Spleen: Normal in size without focal abnormality. Adrenals/Urinary Tract: Normal adrenal glands. No renal obstruction or hydronephrosis. Stable probable hypodense bilateral renal cyst. Stomach/Bowel: Feeding tube is coiled within the stomach. Stomach is highly positioned within the epigastric region but beneath the left subcostal margin as before and just superior to the transverse colon. Because the high position, the overlying costal margin and the close proximity to the transverse colon percutaneous gastric access is high risk for complication. Considered surgical referral for gastrostomy insertion. Vascular/Lymphatic: Tortuosity atherosclerosis noted of the aortoiliac vessels. No gross adenopathy. Large heterogeneous right retroperitoneal hematoma again noted. No significant interval enlargement or change. Other: No new fluid collection or abscess. Negative for ascites. Diffuse body anasarca noted. Musculoskeletal: Degenerative changes noted of the spine. IMPRESSION: High positioned stomach beneath the left subcostal margin and close proximity to the transverse colon. Similar to the 05/20/2016 exam. See above comment. Recommend surgical referral for gastrostomy insertion. Bibasilar mild atelectasis/ consolidation and trace pleural effusions. Cholelithiasis Stable large right retroperitoneal hematoma Aortoiliac atherosclerosis Electronically Signed   By: Judie Petit.  Shick M.D.   On: 06/06/2016 17:25    I have reviewed the patient's current medications.  Assessment/Plan: 1 AKI/CKd vol xs, good solute/acid/base/k  Needs UF. Will try IHD  but vol xs and bp may limit.  Not candidate for long term  therapy at this time. Will need PC soon. Little to no urine 2 Asp pneu per CCM 3 DM controlled 4 Chronic resp failure 4 RP bleed 6 low platelets. Cause ?? 7 Massive obesity 8 Arm fx.  9 CVA 10 Anemia esa/Fe P HD, esa, , TF, stop CRRT   LOS: 30 days   Nilani Hugill L 06/07/2016,6:58 AM

## 2016-06-07 NOTE — Progress Notes (Signed)
Pt's SBP 80s with MAPs 55-60. CVP 7. Pt's mentation is good. Dr. Molli Knock made aware and ok.

## 2016-06-07 NOTE — Progress Notes (Signed)
qPhysical Therapy Treatment Patient Details Name: Omar [name] MRN: [id number] DOB: 1945/07/09 Today's Date: 06/07/2016    History of Present Illness 71 yo admitted with respiratory failure, AMS, aspiration, sepsis, retroperitoneal bleed. ETT3/5-3/8, 3/10-3/21, trach 3/21, 3/27 pt found to have proximal humerus fx without precipitating event. PMHx: CVA with Right hemiplegia, Asthma, Afib, gout, HTN, obesity, OA    PT Comments    Pt progressing well toward goals, however, continues to be limited by R hemiplegia, weakness and diminished activity tolerance. Pt tolerated sitting EOB x 10 min to address postural control and for PROM to BLE. Current d/c recommendation remains appropriate. PT will continue to follow.    Follow Up Recommendations  LTACH;Supervision/Assistance - 24 hour     Equipment Recommendations  None recommended by PT    Recommendations for Other Services       Precautions / Restrictions Precautions Precautions: Fall Precaution Comments: panda, trach, flexiseal, right hemiplegia Required Braces or Orthoses: Sling (R UE) Restrictions Weight Bearing Restrictions: Yes RUE Weight Bearing: Non weight bearing RLE Weight Bearing: Non weight bearing    Mobility  Bed Mobility Overal bed mobility: Needs Assistance Bed Mobility: Supine to Sit;Sit to Supine     Supine to sit: Max assist;+2 for physical assistance;+2 for safety/equipment;HOB elevated Sit to supine: Max assist;+2 for physical assistance;+2 for safety/equipment   General bed mobility comments: max cues to move LE to EOB. Pt able to assist with LLE, however, requires max physical assist to move RLE. Pt requires maxA to elevate trunk from bed. Max physical assist to return trunk and BLE to bed. Pt tolerated sitting EOB x 10 min for trunk/postural control.   Transfers                 General transfer comment: unable to attempt at this time  Ambulation/Gait                 Stairs            Wheelchair Mobility    Modified Rankin (Stroke Patients Only)       Balance Overall balance assessment: Needs assistance Sitting-balance support: Feet supported;Single extremity supported Sitting balance-Leahy Scale: Poor Sitting balance - Comments: pt tolerated EOB x 10 min with LUE support and multimodal cues for midline posture and anterior lean. Pt able to progress from maxA for trunk control to minA, however, is unable to sustain for greater than 10 seconds before fatiguing Postural control: Right lateral lean;Posterior lean                                  Cognition Arousal/Alertness: Awake/alert Behavior During Therapy: Anxious Overall Cognitive Status: Difficult to assess                                 General Comments: Pt nonverbal so hard to assess cognitive status, however, able to follow simple commands with increased time and requires verbal cues for sequencing. Pt anxious with movement due to pain      Exercises General Exercises - Lower Extremity Ankle Circles/Pumps: AROM;Both;10 reps;Supine Heel Slides: Left;5 reps;Supine;AAROM    General Comments General comments (skin integrity, edema, etc.): With pt seated EOB, PROM of BLE performed within minimal available range of motion. Pt able to perform active plantarflexion with max multimodal cues.       Pertinent Vitals/Pain Pain Assessment: Faces Faces  Pain Scale: Hurts even more Pain Location: RUE, RLE, L foot Pain Descriptors / Indicators: Guarding;Grimacing Pain Intervention(s): Limited activity within patient's tolerance;Monitored during session    Home Living                      Prior Function            PT Goals (current goals can now be found in the care plan section) Acute Rehab PT Goals Patient Stated Goal: none stated Progress towards PT goals: Progressing toward goals    Frequency    Min 2X/week      PT Plan Current plan remains  appropriate    Co-evaluation             End of Session Equipment Utilized During Treatment: Oxygen Activity Tolerance: Patient tolerated treatment well Patient left: in bed;with call bell/phone within reach Nurse Communication: Mobility status;Need for lift equipment PT Visit Diagnosis: Muscle weakness (generalized) (M62.81);Hemiplegia and hemiparesis;Pain;Other abnormalities of gait and mobility (R26.89) Hemiplegia - Right/Left: Right Hemiplegia - dominant/non-dominant: Non-dominant Hemiplegia - caused by: Other cerebrovascular disease Pain - Right/Left: Right Pain - part of body: Shoulder;Leg;Ankle and joints of foot (R shoulder, RLE, L foot)     Time: 1610-9604 PT Time Calculation (min) (ACUTE ONLY): 33 min  Charges:  $Therapeutic Exercise: 8-22 mins $Therapeutic Activity: 8-22 mins                    G Codes:       Lane Hacker, SPT Acute Rehab SPT [phone]     Lane Hacker 06/07/2016, 3:51 PM

## 2016-06-07 NOTE — Progress Notes (Addendum)
PCCM Progress Note  Admission date: 05/31/2016 Referring provider: Dr. Clarene Duke, [facility] ER  CC: altered mental status  HPI: 71 y/o male from [facility] admitted 3/5 with altered mental status + H.flu PNA with aspiration, sepsis, hypoxic/hypercapnic respiratory failure. Course c/b retroperitoneal bleed while on heparin with subsequent shock, acute renal failure & prolonged ventilation requiring tracheostomy. On CRRT intermittently through hosp.    has a past medical history of Asthma; Atrial fibrillation (HCC); Gout; Hypertension; Morbid obesity (HCC); Multinodular goiter; Osteoarthrosis and allied disorders; Other and unspecified hyperlipidemia; Stroke [facility]); Thrombocytopenia (HCC); and Vitamin D deficiency.  Antibiotics: Zosyn 3/06 >> 3/08 Rocephin (H.Flu PNA) 3/08 >> 3/14 Vancomycin 3/23 >> 3/28 Cefepime 3/23 >> 4/1 Meropenem 4/1 >> Vancomycin 4/3 >>  Cultures: Urine 3/05 >> negative Blood 3/06 >> negative Resp 3/6 >> H flu (b-lactamase +) Trach stoma site 3/23 >> diphtheroids Resp cx 3/28 >>nl flora Blood 4/1 NGTD  Lines/tubes: ETT 3/05 >> 3/8 ETT 3/10 >> 3/21 Trach (Dr Jenne Pane) 3/21 >>  Lt IJ CVL 3/07 >> 3/27 R HD catheter  >> 3/22 R IJ HD catheter 3/23 >>   Events: 3/05  Transfer to [facility] Sputum 3/06 >> Haemophilus influenzae (beta lactamase positive) Echo 3/06 >> EF 65 to 70%, mod LVH, mod TR, PAS 39 mmHg CT head 3/7 >> no acute abnormality  3/10 - Large retroperitoneal bleed noted on CT after c/o back pain/leg pain 3/27 rt humerus fracture identified - ortho consult 3/27 RUQ [place] .Marland Kitchen Not helpful due to body habitus. On ATC with copious secrtions.  3/29 - Chronic  critically ill 3/27 - end CRRT 3/30 - doing ATC x 24-48h per RN. Rt shoulder pain only when touched per RN. Making urine. Off CRRT. Not on pressors. Family not fully on board about LTAC per RN but looking at [facility]. Needs LTAC.  4/2 - resumed CRRT 4/3 - Family agreeable to PEG tube 4/4 - intermittent  HD  SUBJECTIVE/OVERNIGHT/INTERVAL HX IR: recs surgical referral for gastrostomy as stomach positioned high under left subcostal margin and in close proximity to the transverse colon Remains off pressors  Vital signs: BP (!) 93/53 (BP Location: Left Arm)   Pulse (!) 121   Temp 99 F (37.2 C) (Oral)   Resp (!) 26   Ht  (1.676 m)   Wt (!) 340 lb (154.2 kg) Comment: I feel this weight may be inaccurate  SpO2 100%   BMI 54.88 kg/m   Intake/output:  Intake/Output Summary (Last 24 hours) at 06/07/16 0709 Last data filed at 06/07/16 0700  Gross per 24 hour  Intake             2130 ml  Output             4742 ml  Net            -2612 ml   I/O last 3 completed shifts: In: 3190 [I.V.:120; NG/GT:2270; IV Piggyback:800] Out: 6895 [Urine:170; ZOXWR:6045; Stool:1150]  EXAM General: [name]. Obese HENT: [place]/AT. No obv abnormality. EOMI. +TRACH, +NG Lungs: Coarse breath sounds throughout Chest wall: not tender to palp Heart: RRR. On pressors Abdomen: soft, non tender, not distended. +bs Extremities: 3+ edema BL Neuro: Alert and interactive.   LABS  PULMONARY  Recent Labs Lab 06/04/16 1550  PHART 7.387  PCO2ART 46.5  PO2ART 89.2  HCO3 26.9  O2SAT 96.5    CBC  Recent Labs Lab 06/05/16 0549 06/06/16 0420 06/07/16 0435  HGB 7.8* 8.1* 7.7*  HCT 24.0* 25.2* 25.0*  WBC 5.8 5.0 4.1  PLT 70* 50* 45*    COAGULATION  Recent Labs Lab 06/01/16 1801  INR 1.32    CARDIAC  No results for input(s): TROPONINI in the last 168 hours. No results for input(s): PROBNP in the last 168 hours.   CHEMISTRY  Recent Labs Lab 06/03/16 0305 06/04/16 0442  06/05/16 0549 06/05/16 1535 06/06/16 0500 06/06/16 1600 06/07/16 0435  NA 139 135  < > 134* 135 134* 135 136  K 4.5 3.6  < > 4.5 4.6 4.1 4.2 4.0  CL 101 97*  < > 98* 100* 101 104 103  CO2 27 29  < > GLUCOSE 126* 126*  < > 125* 134* 112* 116* 114*  BUN 158* 96*  < > 131* 96*  64* 46* 36*  CREATININE 3.96* 2.77*  < > 3.96* 3.01* 2.13* 1.86* 1.45*  CALCIUM 8.4* 7.8*  < > 8.3* 8.1* 8.1* 8.0* 7.9*  MG 2.0 1.8  --  2.0  --  2.2  --  2.1  PHOS 4.6 3.4  --  5.0* 4.4 3.3 2.9 2.8  < > = values in this interval not displayed. Estimated Creatinine Clearance: 65.1 mL/min (A) (by C-G formula based on SCr of 1.45 mg/dL (H)).  LIVER  Recent Labs Lab 06/01/16 1801  06/05/16 0549 06/05/16 1535 06/06/16 0500 06/06/16 1600 06/07/16 0435  ALBUMIN  --   < > 1.9* 2.0* 2.0* 1.9* 1.8*  INR 1.32  --   --   --   --   --   --   < > = values in this interval not displayed. INFECTIOUS  Recent Labs Lab 06/04/16 1350 06/05/16 0549 06/06/16 0500  LATICACIDVEN 1.3  --   --   PROCALCITON 6.13 6.68 4.22     ENDOCRINE CBG (last 3)   Recent Labs  06/06/16 2029 06/07/16 0010 06/07/16 0405  GLUCAP 96 106* 90    IMAGING x48h  - image(s) personally visualized  -   highlighted in bold Ct Abdomen Wo Contrast  Result Date: 06/06/2016 CLINICAL DATA:  Dysphagia, assess stomach anatomy for possible percutaneous gastrostomy placement. EXAM: CT ABDOMEN WITHOUT CONTRAST TECHNIQUE: Multidetector CT imaging of the abdomen was performed following the standard protocol without IV contrast. COMPARISON:  05/20/2016 FINDINGS: Lower chest: Limited with motion artifact. Bibasilar atelectasis and mild consolidation, worse on the right with trace pleural effusions. Heart is mildly enlarged. No pericardial effusion. No hiatal hernia. Hepatobiliary: No significant focal hepatic abnormality. Atrophy noted of the left hepatic lobe chronically. No biliary obstruction. Gallstones layer within the mildly distended gallbladder. Common bile duct is nondilated. Pancreas: Limited assessment.  No pancreatic ductal dilatation. Spleen: Normal in size without focal abnormality. Adrenals/Urinary Tract: Normal adrenal glands. No renal obstruction or hydronephrosis. Stable probable hypodense bilateral renal cyst.  Stomach/Bowel: Feeding tube is coiled within the stomach. Stomach is highly positioned within the epigastric region but beneath the left subcostal margin as before and just superior to the transverse colon. Because the high position, the overlying costal margin and the close proximity to the transverse colon percutaneous gastric access is high risk for complication. Considered surgical referral for gastrostomy insertion. Vascular/Lymphatic: Tortuosity atherosclerosis noted of the aortoiliac vessels. No gross adenopathy. Large heterogeneous right retroperitoneal hematoma again noted. No significant interval enlargement or change. Other: No new fluid collection or abscess. Negative for ascites. Diffuse body anasarca noted. Musculoskeletal: Degenerative changes noted of the spine. IMPRESSION: High positioned stomach beneath the left subcostal margin and  close proximity to the transverse colon. Similar to the 05/20/2016 exam. See above comment. Recommend surgical referral for gastrostomy insertion. Bibasilar mild atelectasis/ consolidation and trace pleural effusions. Cholelithiasis Stable large right retroperitoneal hematoma Aortoiliac atherosclerosis Electronically Signed   By: Judie Petit.  Shick M.D.   On: 06/06/2016 17:25    DISCUSSION: 71 year old man, history of fibrillation, obesity, admitted with respiratory failure and septic shock in the setting of H flu pneumonia. Subsequent course complicated by retroperitoneal bleeding on anticoagulation, hemorrhagic shock, acute respiratory failure and acute renal failure. s/p trach. Currently back on vent and CRRT vs HD.  Unfortunately has rt humerus fracture -conservative mx  ASSESSMENT / PLAN:  PULMONARY A: Acute respiratory failure with chronoic resp failure - ATC since 05/30/16 Pneumonia, H influenza (beta lactamase positive) Presumed OSA/OHS History of asthma Bilateral pulm infiltrates / effusions, consistent w volume overload 06/04/16 Back on vent due to sepsis  syndrome, likely HCAP  P:  Vent/trach as tol Cont IV Meropenem + Vanc  CARDIOVASCULAR A:  Shock, principally hemorrhagic however perhaps component of septic Paroxysmal atrial fibrillation History of hypertension 3/31-off pressors 06/04/16 - hypotension, back on pressors 4/2 cortisol 42.6  PLAN Off stress dose steroids, continue to hold anti-hypertensives. Req pressors 4/1, currently off but may require re initiation  RENAL I/O last 3 completed shifts: In: 3190 [I.V.:120; NG/GT:2270; IV Piggyback:800] Out: 6895 [Urine:170; ZOXWR:6045; Stool:1150] Lab Results  Component Value Date   CREATININE 1.45 (H) 06/07/2016   CREATININE 1.86 (H) 06/06/2016   CREATININE 2.13 (H) 06/06/2016   CREATININE 0.80 07/16/2012   A:   Acute renal failure, presumed due to ATN in the setting of hemorrhagic shock 3/30 - creat rising but making urine 3/31 - needs HD  06/04/16 - look dry 4/3 - volume up  P: nephro recs stop CRRT, P HD. May be limited due to pressures  GASTROINTESTINAL A:   Large retroperitoneal bleed noted, some slight increased 3/18 CT scan, hemoglobin currently stable Shock liver, transaminitis, improving Hepatic lesion; noted on CT scan of the abdomen 3/18. 5 cm lesion of the dome of the liver, could represent blood or infection or infarct.  Protein calorie malnutrition - no evidence of active bleed Family OK PEG tube - IR unable to place due to superior location and proximity of colon, rec surgical placement  P:   Continue PPI Continue tube feeds Discuss w/ family surgical procedure, consult gen surg if OK  HEMATOLOGIC  Recent Labs Lab 06/05/16 0549 06/06/16 0420 06/07/16 0435  HGB 7.8* 8.1* 7.7*  HCT 24.0* 25.2* 25.0*  WBC 5.8 5.0 4.1  PLT 70* 50* 45*   A:   Acute blood loss anemia, improved Severe heparin sensitivity, associated with hemorrhage Thrombocytopenia, acute on chronic -came in low at 140k Aranesp 4/2  P:  Follow CBC, Goal hemoglobin greater than  7.0. Currently 7.7 No heparin ever Hold anticoagulation  SCD for DVT prophylaxis  INFECTIOUS A:   H. influenzae pneumonia, treated HCAP Septic shock ? Clinical significance hepatic lesion, ? Possible abscess  3/30 - on abx. Not on pressors 06/04/16 - likely HCAP, started back on pressors  4/3 off pressors   P: Antibiotics for initial pneumonia were completed Cefepime, started 3/23, changed to Merrem 06/04/16 --> and Vanc 4/3 >> No leukocytosis Pan culture: Tracheal asp with few GNR, GP cocci in pairs and clusters -- culture still pending  ENDOCRINE CBG (last 3)   Recent Labs  06/06/16 2029 06/07/16 0010 06/07/16 0405  GLUCAP 96 106* 90   A:  Hyperglycemia, on steroids Hypothyroidism P:   Continue SSI, CBG Continue Synthroid  NEUROLOGIC A:   Toxic metabolic encephalopathy, mental status improving History of CVA with residual deficits Rt humerus fracture  3/31 - follows comands 4/1 - more tired due to sepsis  P:   PT consult - LTAC recommended, family unsure.  RUE sling Morphine prn pain   MSK A Rt shoulder fracture during hospital stay  P: sling + conserv management  FAMILY DISCUSSIONS: Family OK with PEG tube as of 4/3. Case discussed with daughter via phone - demanding transfer and acusing team of 'trying to kill her father' and using extremely inappropriate language throughout. Was again informed that we would be happy to transfer, but she needs to arrange. LTAC recommended, granddaughter requests staying at [facility] vs transfer to [facility].   Inter-disciplinary family meet or Palliative Care meeting due by:  done  CODE STATUS Full Code  DISPO Keep in ICU given above issues  The patient is critically ill with multiple organ systems failure and requires high complexity decision making for assessment and support, frequent evaluation and titration of therapies, application of advanced monitoring technologies and extensive interpretation of multiple databases.    Ramesses Crampton, D.O. Internal Medicine Resident PGY1 Pager [phone]

## 2016-06-07 NOTE — Consult Note (Signed)
[facility] Surgery Consult Note  Omar [name] [birth year]  [id number].    Requesting MD: Molt DO Chief Complaint/Reason for Consult: Surgical G-tube  HPI:  LASEAN GORNIAK is a 71yo male admitted to [facility] 05/06/2016 with altered mental status and positive H flu PNA with aspiration, sepsis, hypoxic/hypercapnic respiratory failure. Course complicated by retroperitoneal bleeding on anticoagulation, hemorrhagic shock, acute respiratory failure and acute renal failure. S/p trach 3/21. Currently back on vent, CRRT stopped again today. He did not tolerate HD over the weekend due to hypotension. IR consulted for PEG tube but due to high positioned stomach beneath the left subcostal margin and close proximity to the transverse colon, unable to be performed in IR. General surgery consulted for consideration of surgical G-tube. Patient is awake and alert this morning. He denies any abdominal pain. Tolerating TF via Cortrak, but he does not like the NG tube. Rectal tube in place and he is having regular bowel movements.   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   Eyes: Negative.   Respiratory: Positive for shortness of breath.   Cardiovascular: Positive for leg swelling. Negative for chest pain.  Gastrointestinal: Negative.   Genitourinary: Negative.   Musculoskeletal: Positive for joint pain (right shoulder).  Skin: Negative.   Neurological: Positive for focal weakness (right hemiplegia) and weakness.  All systems reviewed and otherwise negative except for as above  Family History  Problem Relation Age of Onset  . Coronary artery disease Brother     Past Medical History:  Diagnosis Date  . Asthma   . Atrial fibrillation ([place])   . Gout   . Hypertension   . Morbid  obesity ([place])   . Multinodular goiter   . Osteoarthrosis and allied disorders   . Other and unspecified hyperlipidemia   . Stroke [facility])    right sided hemiparesis  . Thrombocytopenia ([place])   . Vitamin D deficiency     Past Surgical History:  Procedure Laterality Date  . THYROID SURGERY    . TRACHEOSTOMY TUBE PLACEMENT N/A 05/22/2016   Procedure: TRACHEOSTOMY;  Surgeon: Melida Quitter, MD;  Location: [place];  Service: ENT;  Laterality: N/A;    Social History:  reports that he has quit smoking. He has never used smokeless tobacco. He reports that he does not drink alcohol or use drugs.  Allergies:  Allergies  Allergen Reactions  . Anacin Af [Acetaminophen]   . Heparin     Retroperitoneal bleed in 2018, previous bleed from heparin in 2013  . Niaspan [Niacin Er]     Medications Prior to Admission  Medication Sig Dispense Refill  . albuterol (PROVENTIL HFA;VENTOLIN HFA) 108 (90 Base) MCG/ACT inhaler Inhale 2 puffs into the lungs every 6 (six) hours as needed for wheezing or shortness of breath. 1 Inhaler 6  . allopurinol (ZYLOPRIM) 300 MG tablet TAKE 1 TABLET DAILY 30 tablet 2  . amLODipine (NORVASC) 5 MG tablet TAKE 1.5 TABLETS (7.5 MG TOTAL) BY MOUTH DAILY. 135 tablet 1  . atorvastatin (LIPITOR) 20 MG tablet TAKE 1 TABLET (20 MG TOTAL) BY MOUTH DAILY AT 6 PM. 30 tablet 2  . fluticasone (FLONASE) 50 MCG/ACT nasal spray Place 1 spray into both nostrils 2 (two) times daily as needed for allergies or rhinitis. 16 g 6  . furosemide (LASIX) 20 MG tablet Take  1 tablet (20 mg total) by mouth daily. 30 tablet 3  . gabapentin (NEURONTIN) 300 MG capsule TAKE 1 CAPSULE AT 8 AM 1 AT 6 PM AND 2 AT BEDTIME 120 capsule 2  . KLOR-CON M20 20 MEQ tablet TAKE 1 TABLET BY MOUTH EVERY DAY 30 tablet 2  . levothyroxine (SYNTHROID, LEVOTHROID) 125 MCG tablet TAKE 1 TABLET EVERY DAY 30 tablet 6  . losartan (COZAAR) 100 MG tablet TAKE 1 TABLET (100 MG TOTAL) BY MOUTH DAILY. 90 tablet 0  . metoprolol  (LOPRESSOR) 50 MG tablet TAKE 1/2 TABLET 2 TIMES A DAY 90 tablet 0  . Vitamin D, Ergocalciferol, (DRISDOL) 50000 units CAPS capsule Take 1 capsule (50,000 Units total) by mouth every 7 (seven) days. 12 capsule 3  . warfarin (COUMADIN) 7.5 MG tablet TAKE 1 TABLET DAILY AT 6PM 30 tablet 2    Prior to Admission medications   Medication Sig Start Date End Date Taking? Authorizing Provider  albuterol (PROVENTIL HFA;VENTOLIN HFA) 108 (90 Base) MCG/ACT inhaler Inhale 2 puffs into the lungs every 6 (six) hours as needed for wheezing or shortness of breath. 12/09/15  Yes Chipper Herb, MD  allopurinol (ZYLOPRIM) 300 MG tablet TAKE 1 TABLET DAILY 02/09/16  Yes Chipper Herb, MD  amLODipine (NORVASC) 5 MG tablet TAKE 1.5 TABLETS (7.5 MG TOTAL) BY MOUTH DAILY. 03/22/16  Yes Chipper Herb, MD  atorvastatin (LIPITOR) 20 MG tablet TAKE 1 TABLET (20 MG TOTAL) BY MOUTH DAILY AT 6 PM. 04/17/16  Yes Chipper Herb, MD  fluticasone [facility]) 50 MCG/ACT nasal spray Place 1 spray into both nostrils 2 (two) times daily as needed for allergies or rhinitis. 04/14/16  Yes Fransisca Kaufmann Dettinger, MD  furosemide (LASIX) 20 MG tablet Take 1 tablet (20 mg total) by mouth daily. 05/05/16  Yes Eustaquio Maize, MD  gabapentin (NEURONTIN) 300 MG capsule TAKE 1 CAPSULE AT 8 AM 1 AT 6 PM AND 2 AT BEDTIME 04/17/16  Yes Chipper Herb, MD  KLOR-CON M20 20 MEQ tablet TAKE 1 TABLET BY MOUTH EVERY DAY 04/17/16  Yes Chipper Herb, MD  levothyroxine (SYNTHROID, LEVOTHROID) 125 MCG tablet TAKE 1 TABLET EVERY DAY 04/24/16  Yes Chipper Herb, MD  losartan (COZAAR) 100 MG tablet TAKE 1 TABLET (100 MG TOTAL) BY MOUTH DAILY. 03/21/16  Yes Chipper Herb, MD  metoprolol (LOPRESSOR) 50 MG tablet TAKE 1/2 TABLET 2 TIMES A DAY 03/21/16  Yes Chipper Herb, MD  Vitamin D, Ergocalciferol, (DRISDOL) 50000 units CAPS capsule Take 1 capsule (50,000 Units total) by mouth every 7 (seven) days. 03/07/16  Yes Chipper Herb, MD  warfarin (COUMADIN) 7.5 MG tablet TAKE 1  TABLET DAILY AT 6PM 04/17/16  Yes Chipper Herb, MD    Blood pressure (!) 100/52, pulse (!) 117, temperature 98.4 F (36.9 C), temperature source Oral, resp. rate (!) 24, height '5\' 6"'  (1.676 m), weight (!) 340 lb (154.2 kg), SpO2 100 %. Physical Exam: General: pleasant, critically-ill appearing AA male who is laying in bed in NAD HEENT: head is normocephalic, atraumatic.  Sclera are noninjected.  PERRL.  Ears and nose without any masses or lesions.  Mouth is dry. Cortrak right nare. Trach in place. Heart: irregularly irregular.  No obvious murmurs, gallops, or rubs noted.  Palpable pedal pulses bilaterally Lungs: course sounds bilaterally, no wheezes.  Respiratory effort nonlabored Abd: obese, no previous incisions noted, soft, NT/ND, +BS, no masses, hernias, or organomegaly MS: 2+ edema BLE R>L. Decreased sensation and  motor function RUE/RLE versus LUE/LLE. RUE in sling. Skin: warm and dry with no masses, lesions, or rashes Psych: Alert and interactive with an appropriate affect.  Neuro: CM 2-12 intact, unable to assess speech  Results for orders placed or performed during the hospital encounter of 05/05/2016 (from the past 48 hour(s))  Glucose, capillary     Status: Abnormal   Collection Time: 06/05/16 12:10 PM  Result Value Ref Range   Glucose-Capillary 117 (H) 65 - 99 mg/dL   Comment 1 Notify RN    Comment 2 Document in Chart   Renal function panel (daily at 1600)     Status: Abnormal   Collection Time: 06/05/16  3:35 PM  Result Value Ref Range   Sodium 135 135 - 145 mmol/L   Potassium 4.6 3.5 - 5.1 mmol/L   Chloride 100 (L) 101 - 111 mmol/L   CO2 26 22 - 32 mmol/L   Glucose, Bld 134 (H) 65 - 99 mg/dL   BUN 96 (H) 6 - 20 mg/dL   Creatinine, Ser 3.01 (H) 0.61 - 1.24 mg/dL   Calcium 8.1 (L) 8.9 - 10.3 mg/dL   Phosphorus 4.4 2.5 - 4.6 mg/dL   Albumin 2.0 (L) 3.5 - 5.0 g/dL   GFR calc non Af Amer 19 (L) >60 mL/min   GFR calc Af Amer 22 (L) >60 mL/min    Comment: (NOTE) The eGFR  has been calculated using the CKD EPI equation. This calculation has not been validated in all clinical situations. eGFR's persistently <60 mL/min signify possible Chronic Kidney Disease.    Anion gap 9 5 - 15  Glucose, capillary     Status: Abnormal   Collection Time: 06/05/16  3:35 PM  Result Value Ref Range   Glucose-Capillary 131 (H) 65 - 99 mg/dL   Comment 1 Document in Chart   Glucose, capillary     Status: Abnormal   Collection Time: 06/05/16  7:14 PM  Result Value Ref Range   Glucose-Capillary 106 (H) 65 - 99 mg/dL   Comment 1 Notify RN    Comment 2 Document in Chart   Glucose, capillary     Status: Abnormal   Collection Time: 06/05/16 11:40 PM  Result Value Ref Range   Glucose-Capillary 107 (H) 65 - 99 mg/dL   Comment 1 QC Due    Comment 2 Notify RN    Comment 3 Document in Chart   Glucose, capillary     Status: Abnormal   Collection Time: 06/06/16  3:33 AM  Result Value Ref Range   Glucose-Capillary 110 (H) 65 - 99 mg/dL   Comment 1 Notify RN    Comment 2 Document in Chart   CBC with Differential/Platelet     Status: Abnormal   Collection Time: 06/06/16  4:20 AM  Result Value Ref Range   WBC 5.0 4.0 - 10.5 K/uL   RBC 2.40 (L) 4.22 - 5.81 MIL/uL   Hemoglobin 8.1 (L) 13.0 - 17.0 g/dL   HCT 25.2 (L) 39.0 - 52.0 %   MCV 105.0 (H) 78.0 - 100.0 fL   MCH 33.8 26.0 - 34.0 pg   MCHC 32.1 30.0 - 36.0 g/dL   RDW NOT CALCULATED 11.5 - 15.5 %   Platelets 50 (L) 150 - 400 K/uL    Comment: CONSISTENT WITH PREVIOUS RESULT   Neutrophils Relative % 78 %   Lymphocytes Relative 11 %   Monocytes Relative 7 %   Eosinophils Relative 4 %   Basophils Relative 0 %  Neutro Abs 3.8 1.7 - 7.7 K/uL   Lymphs Abs 0.6 (L) 0.7 - 4.0 K/uL   Monocytes Absolute 0.4 0.1 - 1.0 K/uL   Eosinophils Absolute 0.2 0.0 - 0.7 K/uL   Basophils Absolute 0.0 0.0 - 0.1 K/uL   RBC Morphology POLYCHROMASIA PRESENT     Comment: TARGET CELLS  Vancomycin, random     Status: None   Collection Time:  06/06/16  4:20 AM  Result Value Ref Range   Vancomycin Rm 23     Comment:        Random Vancomycin therapeutic range is dependent on dosage and time of specimen collection. A peak range is 20.0-40.0 ug/mL A trough range is 5.0-15.0 ug/mL          Renal function panel (daily at 0500)     Status: Abnormal   Collection Time: 06/06/16  5:00 AM  Result Value Ref Range   Sodium 134 (L) 135 - 145 mmol/L   Potassium 4.1 3.5 - 5.1 mmol/L   Chloride 101 101 - 111 mmol/L   CO2 26 22 - 32 mmol/L   Glucose, Bld 112 (H) 65 - 99 mg/dL   BUN 64 (H) 6 - 20 mg/dL   Creatinine, Ser 2.13 (H) 0.61 - 1.24 mg/dL   Calcium 8.1 (L) 8.9 - 10.3 mg/dL   Phosphorus 3.3 2.5 - 4.6 mg/dL   Albumin 2.0 (L) 3.5 - 5.0 g/dL   GFR calc non Af Amer 29 (L) >60 mL/min   GFR calc Af Amer 34 (L) >60 mL/min    Comment: (NOTE) The eGFR has been calculated using the CKD EPI equation. This calculation has not been validated in all clinical situations. eGFR's persistently <60 mL/min signify possible Chronic Kidney Disease.    Anion gap 7 5 - 15  Magnesium     Status: None   Collection Time: 06/06/16  5:00 AM  Result Value Ref Range   Magnesium 2.2 1.7 - 2.4 mg/dL  Procalcitonin     Status: None   Collection Time: 06/06/16  5:00 AM  Result Value Ref Range   Procalcitonin 4.22 ng/mL    Comment:        Interpretation: PCT > 2 ng/mL: Systemic infection (sepsis) is likely, unless other causes are known. (NOTE)         ICU PCT Algorithm               Non ICU PCT Algorithm    ----------------------------     ------------------------------         PCT < 0.25 ng/mL                 PCT < 0.1 ng/mL     Stopping of antibiotics            Stopping of antibiotics       strongly encouraged.               strongly encouraged.    ----------------------------     ------------------------------       PCT level decrease by               PCT < 0.25 ng/mL       >= 80% from peak PCT       OR PCT 0.25 - 0.5 ng/mL          Stopping  of antibiotics  encouraged.     Stopping of antibiotics           encouraged.    ----------------------------     ------------------------------       PCT level decrease by              PCT >= 0.25 ng/mL       < 80% from peak PCT        AND PCT >= 0.5 ng/mL            Continuing antibiotics                                               encouraged.       Continuing antibiotics            encouraged.    ----------------------------     ------------------------------     PCT level increase compared          PCT > 0.5 ng/mL         with peak PCT AND          PCT >= 0.5 ng/mL             Escalation of antibiotics                                          strongly encouraged.      Escalation of antibiotics        strongly encouraged.   Glucose, capillary     Status: Abnormal   Collection Time: 06/06/16  7:31 AM  Result Value Ref Range   Glucose-Capillary 105 (H) 65 - 99 mg/dL   Comment 1 Notify RN    Comment 2 Document in Chart   Glucose, capillary     Status: None   Collection Time: 06/06/16 11:17 AM  Result Value Ref Range   Glucose-Capillary 87 65 - 99 mg/dL   Comment 1 Notify RN    Comment 2 Document in Chart   Renal function panel (daily at 1600)     Status: Abnormal   Collection Time: 06/06/16  4:00 PM  Result Value Ref Range   Sodium 135 135 - 145 mmol/L   Potassium 4.2 3.5 - 5.1 mmol/L   Chloride 104 101 - 111 mmol/L   CO2 26 22 - 32 mmol/L   Glucose, Bld 116 (H) 65 - 99 mg/dL   BUN 46 (H) 6 - 20 mg/dL   Creatinine, Ser 1.86 (H) 0.61 - 1.24 mg/dL   Calcium 8.0 (L) 8.9 - 10.3 mg/dL   Phosphorus 2.9 2.5 - 4.6 mg/dL   Albumin 1.9 (L) 3.5 - 5.0 g/dL   GFR calc non Af Amer 35 (L) >60 mL/min   GFR calc Af Amer 40 (L) >60 mL/min    Comment: (NOTE) The eGFR has been calculated using the CKD EPI equation. This calculation has not been validated in all clinical situations. eGFR's persistently <60 mL/min signify possible Chronic  Kidney Disease.    Anion gap 5 5 - 15  Glucose, capillary     Status: Abnormal   Collection Time: 06/06/16  4:26 PM  Result Value Ref Range   Glucose-Capillary 109 (H) 65 - 99 mg/dL   Comment 1 Notify RN    Comment 2 Document  in Chart   Glucose, capillary     Status: None   Collection Time: 06/06/16  8:29 PM  Result Value Ref Range   Glucose-Capillary 96 65 - 99 mg/dL   Comment 1 Notify RN   Glucose, capillary     Status: Abnormal   Collection Time: 06/07/16 12:10 AM  Result Value Ref Range   Glucose-Capillary 106 (H) 65 - 99 mg/dL   Comment 1 Document in Chart   Glucose, capillary     Status: None   Collection Time: 06/07/16  4:05 AM  Result Value Ref Range   Glucose-Capillary 90 65 - 99 mg/dL   Comment 1 Notify RN   Renal function panel (daily at 0500)     Status: Abnormal   Collection Time: 06/07/16  4:35 AM  Result Value Ref Range   Sodium 136 135 - 145 mmol/L   Potassium 4.0 3.5 - 5.1 mmol/L   Chloride 103 101 - 111 mmol/L   CO2 28 22 - 32 mmol/L   Glucose, Bld 114 (H) 65 - 99 mg/dL   BUN 36 (H) 6 - 20 mg/dL   Creatinine, Ser 1.45 (H) 0.61 - 1.24 mg/dL   Calcium 7.9 (L) 8.9 - 10.3 mg/dL   Phosphorus 2.8 2.5 - 4.6 mg/dL   Albumin 1.8 (L) 3.5 - 5.0 g/dL   GFR calc non Af Amer 47 (L) >60 mL/min   GFR calc Af Amer 54 (L) >60 mL/min    Comment: (NOTE) The eGFR has been calculated using the CKD EPI equation. This calculation has not been validated in all clinical situations. eGFR's persistently <60 mL/min signify possible Chronic Kidney Disease.    Anion gap 5 5 - 15  CBC with Differential/Platelet     Status: Abnormal   Collection Time: 06/07/16  4:35 AM  Result Value Ref Range   WBC 4.1 4.0 - 10.5 K/uL   RBC 2.30 (L) 4.22 - 5.81 MIL/uL   Hemoglobin 7.7 (L) 13.0 - 17.0 g/dL   HCT 25.0 (L) 39.0 - 52.0 %   MCV 108.7 (H) 78.0 - 100.0 fL   MCH 33.5 26.0 - 34.0 pg   MCHC 30.8 30.0 - 36.0 g/dL   RDW NOT CALCULATED 11.5 - 15.5 %   Platelets 45 (L) 150 - 400 K/uL     Comment: CONSISTENT WITH PREVIOUS RESULT   Neutrophils Relative % 74 %   Lymphocytes Relative 14 %   Monocytes Relative 7 %   Eosinophils Relative 5 %   Basophils Relative 0 %   Neutro Abs 3.0 1.7 - 7.7 K/uL   Lymphs Abs 0.6 (L) 0.7 - 4.0 K/uL   Monocytes Absolute 0.3 0.1 - 1.0 K/uL   Eosinophils Absolute 0.2 0.0 - 0.7 K/uL   Basophils Absolute 0.0 0.0 - 0.1 K/uL   RBC Morphology POLYCHROMASIA PRESENT    WBC Morphology TOXIC GRANULATION   Magnesium     Status: None   Collection Time: 06/07/16  4:35 AM  Result Value Ref Range   Magnesium 2.1 1.7 - 2.4 mg/dL  Glucose, capillary     Status: None   Collection Time: 06/07/16  7:19 AM  Result Value Ref Range   Glucose-Capillary 93 65 - 99 mg/dL   Comment 1 Notify RN    Comment 2 Document in Chart    Ct Abdomen Wo Contrast  Result Date: 06/06/2016 CLINICAL DATA:  Dysphagia, assess stomach anatomy for possible percutaneous gastrostomy placement. EXAM: CT ABDOMEN WITHOUT CONTRAST TECHNIQUE: Multidetector CT imaging of the  abdomen was performed following the standard protocol without IV contrast. COMPARISON:  05/20/2016 FINDINGS: Lower chest: Limited with motion artifact. Bibasilar atelectasis and mild consolidation, worse on the right with trace pleural effusions. Heart is mildly enlarged. No pericardial effusion. No hiatal hernia. Hepatobiliary: No significant focal hepatic abnormality. Atrophy noted of the left hepatic lobe chronically. No biliary obstruction. Gallstones layer within the mildly distended gallbladder. Common bile duct is nondilated. Pancreas: Limited assessment.  No pancreatic ductal dilatation. Spleen: Normal in size without focal abnormality. Adrenals/Urinary Tract: Normal adrenal glands. No renal obstruction or hydronephrosis. Stable probable hypodense bilateral renal cyst. Stomach/Bowel: Feeding tube is coiled within the stomach. Stomach is highly positioned within the epigastric region but beneath the left subcostal margin as  before and just superior to the transverse colon. Because the high position, the overlying costal margin and the close proximity to the transverse colon percutaneous gastric access is high risk for complication. Considered surgical referral for gastrostomy insertion. Vascular/Lymphatic: Tortuosity atherosclerosis noted of the aortoiliac vessels. No gross adenopathy. Large heterogeneous right retroperitoneal hematoma again noted. No significant interval enlargement or change. Other: No new fluid collection or abscess. Negative for ascites. Diffuse body anasarca noted. Musculoskeletal: Degenerative changes noted of the spine. IMPRESSION: High positioned stomach beneath the left subcostal margin and close proximity to the transverse colon. Similar to the 05/20/2016 exam. See above comment. Recommend surgical referral for gastrostomy insertion. Bibasilar mild atelectasis/ consolidation and trace pleural effusions. Cholelithiasis Stable large right retroperitoneal hematoma Aortoiliac atherosclerosis Electronically Signed   By: Jerilynn Mages.  Shick M.D.   On: 06/06/2016 17:25   Anti-infectives    Start     Dose/Rate Route Frequency Ordered Stop   06/14/2016 0900  meropenem (MERREM) 500 mg in sodium chloride 0.9 % 50 mL IVPB     500 mg 100 mL/hr over 30 Minutes Intravenous Every 24 hours 06/07/16 0955 06/11/16 2359   06/06/16 1200  vancomycin (VANCOCIN) 1,500 mg in sodium chloride 0.9 % 500 mL IVPB  Status:  Discontinued     1,500 mg 250 mL/hr over 120 Minutes Intravenous Every 24 hours 06/06/16 0818 06/07/16 0954   06/05/16 1000  meropenem (MERREM) 1 g in sodium chloride 0.9 % 100 mL IVPB  Status:  Discontinued     1 g 200 mL/hr over 30 Minutes Intravenous Every 12 hours 06/05/16 0926 06/07/16 0955   06/04/16 1400  vancomycin (VANCOCIN) 2,000 mg in sodium chloride 0.9 % 500 mL IVPB     2,000 mg 250 mL/hr over 120 Minutes Intravenous  Once 06/04/16 1234 06/04/16 1737   06/04/16 1400  meropenem (MERREM) 500 mg in sodium  chloride 0.9 % 50 mL IVPB  Status:  Discontinued     500 mg 100 mL/hr over 30 Minutes Intravenous Every 24 hours 06/04/16 1234 06/05/16 0926   05/30/16 2300  ceFEPIme (MAXIPIME) 1 g in dextrose 5 % 50 mL IVPB  Status:  Discontinued     1 g 100 mL/hr over 30 Minutes Intravenous Every 24 hours 05/30/16 1223 06/04/16 1231   05/27/16 1400  ceFEPIme (MAXIPIME) 2 g in dextrose 5 % 50 mL IVPB  Status:  Discontinued     2 g 100 mL/hr over 30 Minutes Intravenous Every 12 hours 05/27/16 1014 05/30/16 1223   05/27/16 1400  vancomycin (VANCOCIN) 1,500 mg in sodium chloride 0.9 % 500 mL IVPB  Status:  Discontinued     1,500 mg 250 mL/hr over 120 Minutes Intravenous Every 24 hours 05/27/16 1014 05/30/16 1224   05/26/16 1300  ceFEPIme (MAXIPIME) 1 g in dextrose 5 % 50 mL IVPB  Status:  Discontinued     1 g 100 mL/hr over 30 Minutes Intravenous Every 24 hours 05/26/16 1225 05/27/16 1014   05/26/16 1230  vancomycin (VANCOCIN) 2,500 mg in sodium chloride 0.9 % 500 mL IVPB     2,500 mg 250 mL/hr over 120 Minutes Intravenous  Once 05/26/16 1225 05/26/16 1530   05/16/16 1400  cefTRIAXone (ROCEPHIN) 2 g in dextrose 5 % 50 mL IVPB     2 g 100 mL/hr over 30 Minutes Intravenous Every 24 hours 05/15/16 1649 05/17/16 1458   05/11/16 1400  cefTRIAXone (ROCEPHIN) 1 g in dextrose 5 % 50 mL IVPB  Status:  Discontinued     1 g 100 mL/hr over 30 Minutes Intravenous Every 24 hours 05/11/16 0833 05/15/16 1649   05/09/16 1400  piperacillin-tazobactam (ZOSYN) IVPB 3.375 g  Status:  Discontinued     3.375 g 12.5 mL/hr over 240 Minutes Intravenous Every 8 hours 05/09/16 1114 05/11/16 0833        Assessment/Plan Prolonged NPO status Protein calorie malnutrition - on TF H. Influenzae pneumonia - treated HCAP - back on vent Shock: hemorrhagic +/- septic - off pressors Acute renal failure - now back off CRRT Acute blood loss anemia Paroxysmal atrial fibrillation H/o HTN H/o CVA with right sided  weakness Hypothyroidism Severe heparin sensitivity Thrombocytopenia Toxic metabolic encephalopathy - mental status improving Right proximal humerus fracture   ID - Zosyn 3/06 >> 3/08, Rocephin (H.Flu PNA) 3/08 >> 3/14, Vancomycin 3/23 >> 3/28, Cefepime 3/23 >> 4/1, Meropenem 4/1 >>, Vancomycin 4/3 >> VTE - SCDs FEN - NPO, TF  Plan -  Per Dr. Nelda Marseille, family has agreed to G-tube placement. I attempted to reach granddaughter but was unsuccessful. Patient is NPO and off chemical DVT prophylaxis. Will discuss OR timing with MD, but plan for surgical G-tube in the near future.  Jerrye Beavers, [facility] Surgery 06/07/2016, 10:21 AM Pager: [phone] Consults: [phone] Mon-Fri 7:00 am-4:30 pm Sat-Sun 7:00 am-11:30 am

## 2016-06-07 NOTE — Progress Notes (Signed)
PCCM Progress Note  Admission date: 05/07/2016 Referring provider: Dr. Clarene Duke, [facility] ER  CC: altered mental status  HPI: 71 y/o male from [facility] admitted 3/5 with altered mental status + H.flu PNA with aspiration, sepsis, hypoxic/hypercapnic respiratory failure. Course c/b retroperitoneal bleed while on heparin with subsequent shock, acute renal failure & prolonged ventilation requiring tracheostomy. On CRRT intermittently through hosp.    has a past medical history of Asthma; Atrial fibrillation (HCC); Gout; Hypertension; Morbid obesity (HCC); Multinodular goiter; Osteoarthrosis and allied disorders; Other and unspecified hyperlipidemia; Stroke [facility]); Thrombocytopenia (HCC); and Vitamin D deficiency.  Antibiotics: Zosyn 3/06 >> 3/08 Rocephin (H.Flu PNA) 3/08 >> 3/14 Vancomycin 3/23 >> 3/28 Cefepime 3/23 >> 4/1 Meropenem 4/1 >> Vancomycin 4/3 >>  Cultures: Urine 3/05 >> negative Blood 3/06 >> negative Resp 3/6 >> H flu (b-lactamase +) Trach stoma site 3/23 >> diphtheroids Resp cx 3/28 >>nl flora Blood 4/1 NGTD  Lines/tubes: ETT 3/05 >> 3/8 ETT 3/10 >> 3/21 Trach (Dr Jenne Pane) 3/21 >>  Lt IJ CVL 3/07 >> 3/27 R HD catheter  >> 3/22 R IJ HD catheter 3/23 >>   Events: 3/05  Transfer to [facility] Sputum 3/06 >> Haemophilus influenzae (beta lactamase positive) Echo 3/06 >> EF 65 to 70%, mod LVH, mod TR, PAS 39 mmHg CT head 3/7 >> no acute abnormality  3/10 - Large retroperitoneal bleed noted on CT after c/o back pain/leg pain 3/27 rt humerus fracture identified - ortho consult 3/27 RUQ [place] .Marland Kitchen Not helpful due to body habitus. On ATC with copious secrtions.  3/29 - Chronic  critically ill 3/27 - end CRRT 3/30 - doing ATC x 24-48h per RN. Rt shoulder pain only when touched per RN. Making urine. Off CRRT. Not on pressors. Family not fully on board about LTAC per RN but looking at [facility]. Needs LTAC.  4/2 - resumed CRRT 4/3 - Family agreeable to PEG tube 4/4 - intermittent  HD  SUBJECTIVE/OVERNIGHT/INTERVAL HX IR: recs surgical referral for gastrostomy as stomach positioned high under left subcostal margin and in close proximity to the transverse colon Remains off pressors  Vital signs: BP (!) 100/52   Pulse (!) 117   Temp 98.4 F (36.9 C) (Oral)   Resp (!) 24   Ht  (1.676 m)   Wt (!) 154.2 kg (340 lb) Comment: I feel this weight may be inaccurate  SpO2 100%   BMI 54.88 kg/m   Intake/output:  Intake/Output Summary (Last 24 hours) at 06/07/16 0951 Last data filed at 06/07/16 0902  Gross per 24 hour  Intake             2010 ml  Output             4662 ml  Net            -2652 ml   I/O last 3 completed shifts: In: 3190 [I.V.:120; NG/GT:2270; IV Piggyback:800] Out: 6895 [Urine:170; JXBJY:7829; Stool:1150]  EXAM General: [name]. Obese HENT: [place]/AT. No obv abnormality. EOMI. +TRACH, +NG Lungs: Coarse breath sounds throughout Chest wall: not tender to palp Heart: RRR. On pressors Abdomen: soft, non tender, not distended. +bs Extremities: 3+ edema BL Neuro: Alert and interactive.   LABS  PULMONARY  Recent Labs Lab 06/04/16 1550  PHART 7.387  PCO2ART 46.5  PO2ART 89.2  HCO3 26.9  O2SAT 96.5    CBC  Recent Labs Lab 06/05/16 0549 06/06/16 0420 06/07/16 0435  HGB 7.8* 8.1* 7.7*  HCT 24.0* 25.2* 25.0*  WBC 5.8 5.0  4.1  PLT 70* 50* 45*    COAGULATION  Recent Labs Lab 06/01/16 1801  INR 1.32    CARDIAC  No results for input(s): TROPONINI in the last 168 hours. No results for input(s): PROBNP in the last 168 hours.   CHEMISTRY  Recent Labs Lab 06/03/16 0305 06/04/16 0442  06/05/16 0549 06/05/16 1535 06/06/16 0500 06/06/16 1600 06/07/16 0435  NA 139 135  < > 134* 135 134* 135 136  K 4.5 3.6  < > 4.5 4.6 4.1 4.2 4.0  CL 101 97*  < > 98* 100* 101 104 103  CO2 27 29  < > GLUCOSE 126* 126*  < > 125* 134* 112* 116* 114*  BUN 158* 96*  < > 131* 96* 64* 46* 36*   CREATININE 3.96* 2.77*  < > 3.96* 3.01* 2.13* 1.86* 1.45*  CALCIUM 8.4* 7.8*  < > 8.3* 8.1* 8.1* 8.0* 7.9*  MG 2.0 1.8  --  2.0  --  2.2  --  2.1  PHOS 4.6 3.4  --  5.0* 4.4 3.3 2.9 2.8  < > = values in this interval not displayed. Estimated Creatinine Clearance: 65.1 mL/min (A) (by C-G formula based on SCr of 1.45 mg/dL (H)).  LIVER  Recent Labs Lab 06/01/16 1801  06/05/16 0549 06/05/16 1535 06/06/16 0500 06/06/16 1600 06/07/16 0435  ALBUMIN  --   < > 1.9* 2.0* 2.0* 1.9* 1.8*  INR 1.32  --   --   --   --   --   --   < > = values in this interval not displayed. INFECTIOUS  Recent Labs Lab 06/04/16 1350 06/05/16 0549 06/06/16 0500  LATICACIDVEN 1.3  --   --   PROCALCITON 6.13 6.68 4.22     ENDOCRINE CBG (last 3)   Recent Labs  06/07/16 0010 06/07/16 0405 06/07/16 0719  GLUCAP 106* 90 93    IMAGING x48h  - image(s) personally visualized  -   highlighted in bold Ct Abdomen Wo Contrast  Result Date: 06/06/2016 CLINICAL DATA:  Dysphagia, assess stomach anatomy for possible percutaneous gastrostomy placement. EXAM: CT ABDOMEN WITHOUT CONTRAST TECHNIQUE: Multidetector CT imaging of the abdomen was performed following the standard protocol without IV contrast. COMPARISON:  05/20/2016 FINDINGS: Lower chest: Limited with motion artifact. Bibasilar atelectasis and mild consolidation, worse on the right with trace pleural effusions. Heart is mildly enlarged. No pericardial effusion. No hiatal hernia. Hepatobiliary: No significant focal hepatic abnormality. Atrophy noted of the left hepatic lobe chronically. No biliary obstruction. Gallstones layer within the mildly distended gallbladder. Common bile duct is nondilated. Pancreas: Limited assessment.  No pancreatic ductal dilatation. Spleen: Normal in size without focal abnormality. Adrenals/Urinary Tract: Normal adrenal glands. No renal obstruction or hydronephrosis. Stable probable hypodense bilateral renal cyst. Stomach/Bowel:  Feeding tube is coiled within the stomach. Stomach is highly positioned within the epigastric region but beneath the left subcostal margin as before and just superior to the transverse colon. Because the high position, the overlying costal margin and the close proximity to the transverse colon percutaneous gastric access is high risk for complication. Considered surgical referral for gastrostomy insertion. Vascular/Lymphatic: Tortuosity atherosclerosis noted of the aortoiliac vessels. No gross adenopathy. Large heterogeneous right retroperitoneal hematoma again noted. No significant interval enlargement or change. Other: No new fluid collection or abscess. Negative for ascites. Diffuse body anasarca noted. Musculoskeletal: Degenerative changes noted of the spine. IMPRESSION: High positioned stomach beneath the left subcostal margin and close proximity to  the transverse colon. Similar to the 05/20/2016 exam. See above comment. Recommend surgical referral for gastrostomy insertion. Bibasilar mild atelectasis/ consolidation and trace pleural effusions. Cholelithiasis Stable large right retroperitoneal hematoma Aortoiliac atherosclerosis Electronically Signed   By: Judie Petit.  Shick M.D.   On: 06/06/2016 17:25   I reviewed CXR myself, trach in good position  DISCUSSION: 71 year old man, history of fibrillation, obesity, admitted with respiratory failure and septic shock in the setting of H flu pneumonia. Subsequent course complicated by retroperitoneal bleeding on anticoagulation, hemorrhagic shock, acute respiratory failure and acute renal failure. s/p trach. Currently back on vent and CRRT vs HD.  Unfortunately has rt humerus fracture -conservative mx  ASSESSMENT / PLAN:  PULMONARY A: Acute respiratory failure with chronoic resp failure - ATC since 05/30/16 Pneumonia, H influenza (beta lactamase positive) Presumed OSA/OHS History of asthma Bilateral pulm infiltrates / effusions, consistent w volume  overload 06/04/16 Back on vent due to sepsis syndrome, likely HCAP  P:   Maintain on TC as tolerated Cont IV Meropenem D/C vanc  CARDIOVASCULAR A:  Shock, principally hemorrhagic however perhaps component of septic Paroxysmal atrial fibrillation History of hypertension 3/31-off pressors 06/04/16 - hypotension, back on pressors 4/2 cortisol 42.6  PLAN Off stress dose steroids, continue to hold anti-hypertensives. D/C pressor.  RENAL I/O last 3 completed shifts: In: 3190 [I.V.:120; NG/GT:2270; IV Piggyback:800] Out: 6895 [Urine:170; ZOXWR:6045; Stool:1150] Lab Results  Component Value Date   CREATININE 1.45 (H) 06/07/2016   CREATININE 1.86 (H) 06/06/2016   CREATININE 2.13 (H) 06/06/2016   CREATININE 0.80 07/16/2012   A:   Acute renal failure, presumed due to ATN in the setting of hemorrhagic shock 3/30 - creat rising but making urine 3/31 - needs HD  06/04/16 - look dry 4/3 - volume up  P:  Standard HD at this point BMET in AM Replace electrolytes as indicated  GASTROINTESTINAL A:   Large retroperitoneal bleed noted, some slight increased 3/18 CT scan, hemoglobin currently stable Shock liver, transaminitis, improving Hepatic lesion; noted on CT scan of the abdomen 3/18. 5 cm lesion of the dome of the liver, could represent blood or infection or infarct.  Protein calorie malnutrition - no evidence of active bleed Family OK PEG tube - IR unable to place due to superior location and proximity of colon, rec surgical placement  P:   Continue PPI Continue tube feeds Consult surgery for PEG placement  HEMATOLOGIC  Recent Labs Lab 06/05/16 0549 06/06/16 0420 06/07/16 0435  HGB 7.8* 8.1* 7.7*  HCT 24.0* 25.2* 25.0*  WBC 5.8 5.0 4.1  PLT 70* 50* 45*   A:   Acute blood loss anemia, improved Severe heparin sensitivity, associated with hemorrhage Thrombocytopenia, acute on chronic -came in low at 140k Aranesp 4/2  P:  Follow CBC, Goal hemoglobin greater than  7.0. Currently 7.7 No heparin ever Hold anticoagulation  SCD for DVT prophylaxis  INFECTIOUS A:   H. influenzae pneumonia, treated HCAP Septic shock ? Clinical significance hepatic lesion, ? Possible abscess  3/30 - on abx. Not on pressors 06/04/16 - likely HCAP, started back on pressors  4/3 off pressors   P:  Antibiotics for initial pneumonia were completed Cefepime, started 3/23, changed to Merrem 06/04/16, place stop date at 8 days 4/8 D/C vancomycin 4/4 No leukocytosis Pan culture: Tracheal asp with few GNR, GP cocci in pairs and clusters -- culture still pending  ENDOCRINE CBG (last 3)   Recent Labs  06/07/16 0010 06/07/16 0405 06/07/16 0719  GLUCAP 106* 90 93  A:   Hyperglycemia, on steroids Hypothyroidism P:   Continue SSI, CBG Continue Synthroid  NEUROLOGIC A:   Toxic metabolic encephalopathy, mental status improving History of CVA with residual deficits Rt humerus fracture  3/31 - follows comands 4/1 - more tired due to sepsis  P:   PT consult - LTAC recommended, family refused.  RUE sling Morphine prn pain   MSK A Rt shoulder fracture during hospital stay  P: sling + conserv management  FAMILY DISCUSSIONS: Family OK with PEG tube as of 4/3. Case discussed with daughter via phone - demanding transfer and acusing team of 'trying to kill her father' and using extremely inappropriate language throughout. Was again informed that we would be happy to transfer, but she needs to arrange. LTAC recommended, granddaughter requests staying [facility].   Inter-disciplinary family meet or Palliative Care meeting due by:  done  CODE STATUS Full Code  DISPO Keep in ICU given above issues  Discussed with [facility]-MD  Transfer to SDU and to [facility] service with PCCM following for trach management.  Alyson Reedy, M.D. [facility] Pulmonary/Critical Care Medicine. Pager: [phone]. After hours pager: [phone].

## 2016-06-07 NOTE — Progress Notes (Signed)
Pharmacy Antibiotic Note  Omar [name] is a 71 y.o. male s/p multiple rounds of abx for pneumonia, trach stomatitis and now re-broadening abx on 4/1 due to new VAP. Patient is afebrile with stable wbc wnl. CRRT was restarted yesterday without interruptions. Patient is being transitioned to [facility], therefore antibiotics will need to be adjusted. Patient is afebrile with a stable wbc at 5. Urine output still at 0.1 ml/kg/hr.   Plan: -Meropenem 500 mg IV q24h -Stop date for 06/11/16 entered  -Monitor HD tolerance    Height:  (167.6 cm) Weight: (!) 340 lb (154.2 kg) (I feel this weight may be inaccurate) IBW/kg (Calculated) : 63.8  Temp (24hrs), Avg:98.7 F (37.1 C), Min:97.6 F (36.4 C), Max:99.5 F (37.5 C)   Recent Labs Lab 06/04/16 1350  06/04/16 2240 06/05/16 0549 06/05/16 1535 06/06/16 0420 06/06/16 0500 06/06/16 1600 06/07/16 0435  WBC 5.0  --  6.6 5.8  --  5.0  --   --  4.1  CREATININE  --   < >  --  3.96* 3.01*  --  2.13* 1.86* 1.45*  LATICACIDVEN 1.3  --   --   --   --   --   --   --   --   VANCORANDOM  --   --   --   --   --  23  --   --   --   < > = values in this interval not displayed.  Estimated Creatinine Clearance: 65.1 mL/min (A) (by C-G formula based on SCr of 1.45 mg/dL (H)).    Allergies  Allergen Reactions  . Anacin Af [Acetaminophen]   . Heparin     Retroperitoneal bleed in 2018, previous bleed from heparin in 2013  . Niaspan [Niacin Er]     Antimicrobials this admission: Zosyn 3/6 >> 3/8 CTX 3/8 >> 3/14 Cefepime 3/23 >> 4/1 Vancomycin 3/23 >> 3/28, 4/1 > Meropenem 4/1 >  Dose adjustments this admission:  3/28 Vanc random = 37 4/2 Vanc random = 23  Microbiology results: 3/6 MRSA PCR >>negative 3/6 Blood >> negative 3/5 Urine >> negative 3/6 Resp >> H. Influenzae, beta lactamase positive 3/23 Resp >> Diptheroids 3/28 Resp >> NF 4/1 Blood >> ngtd 4/1 Resp >> 4/1 Urine >> negative  Allie Bossier, PharmD PGY1 Pharmacy  Resident [phone] (Pager) 06/07/2016 10:13 AM

## 2016-06-08 ENCOUNTER — Encounter (HOSPITAL_COMMUNITY): Payer: Self-pay | Admitting: Certified Registered Nurse Anesthetist

## 2016-06-08 ENCOUNTER — Inpatient Hospital Stay (HOSPITAL_COMMUNITY): Payer: Medicare Other | Admitting: Certified Registered Nurse Anesthetist

## 2016-06-08 ENCOUNTER — Encounter (HOSPITAL_COMMUNITY): Admission: EM | Disposition: E | Payer: Self-pay | Source: Home / Self Care | Attending: Internal Medicine

## 2016-06-08 DIAGNOSIS — I959 Hypotension, unspecified: Secondary | ICD-10-CM

## 2016-06-08 LAB — GLUCOSE, CAPILLARY
GLUCOSE-CAPILLARY: 104 mg/dL — AB (ref 65–99)
GLUCOSE-CAPILLARY: 105 mg/dL — AB (ref 65–99)
GLUCOSE-CAPILLARY: 77 mg/dL (ref 65–99)
GLUCOSE-CAPILLARY: 80 mg/dL (ref 65–99)
GLUCOSE-CAPILLARY: 89 mg/dL (ref 65–99)
Glucose-Capillary: 73 mg/dL (ref 65–99)
Glucose-Capillary: 95 mg/dL (ref 65–99)
Glucose-Capillary: 95 mg/dL (ref 65–99)

## 2016-06-08 LAB — CBC WITH DIFFERENTIAL/PLATELET
BASOS PCT: 0 %
Basophils Absolute: 0 10*3/uL (ref 0.0–0.1)
EOS ABS: 0.2 10*3/uL (ref 0.0–0.7)
Eosinophils Relative: 4 %
HEMATOCRIT: 23.3 % — AB (ref 39.0–52.0)
HEMOGLOBIN: 7.3 g/dL — AB (ref 13.0–17.0)
LYMPHS PCT: 18 %
Lymphs Abs: 0.9 10*3/uL (ref 0.7–4.0)
MCH: 34.8 pg — ABNORMAL HIGH (ref 26.0–34.0)
MCHC: 31.3 g/dL (ref 30.0–36.0)
MCV: 111 fL — ABNORMAL HIGH (ref 78.0–100.0)
Monocytes Absolute: 0.4 10*3/uL (ref 0.1–1.0)
Monocytes Relative: 8 %
NEUTROS PCT: 70 %
Neutro Abs: 3.6 10*3/uL (ref 1.7–7.7)
Platelets: 52 10*3/uL — ABNORMAL LOW (ref 150–400)
RBC: 2.1 MIL/uL — AB (ref 4.22–5.81)
WBC: 5.1 10*3/uL (ref 4.0–10.5)

## 2016-06-08 LAB — TYPE AND SCREEN
ABO/RH(D): B POS
ANTIBODY SCREEN: NEGATIVE

## 2016-06-08 LAB — RENAL FUNCTION PANEL
Albumin: 1.7 g/dL — ABNORMAL LOW (ref 3.5–5.0)
Anion gap: 6 (ref 5–15)
BUN: 59 mg/dL — ABNORMAL HIGH (ref 6–20)
CO2: 26 mmol/L (ref 22–32)
Calcium: 8 mg/dL — ABNORMAL LOW (ref 8.9–10.3)
Chloride: 102 mmol/L (ref 101–111)
Creatinine, Ser: 2.71 mg/dL — ABNORMAL HIGH (ref 0.61–1.24)
GFR calc Af Amer: 25 mL/min — ABNORMAL LOW (ref >60)
GFR calc non Af Amer: 22 mL/min — ABNORMAL LOW (ref >60)
Glucose, Bld: 91 mg/dL (ref 65–99)
Phosphorus: 3.4 mg/dL (ref 2.5–4.6)
Potassium: 4.2 mmol/L (ref 3.5–5.1)
Sodium: 134 mmol/L — ABNORMAL LOW (ref 135–145)

## 2016-06-08 LAB — PROTIME-INR
INR: 1.33
Prothrombin Time: 16.6 seconds — ABNORMAL HIGH (ref 11.4–15.2)

## 2016-06-08 LAB — MAGNESIUM: Magnesium: 2.2 mg/dL (ref 1.7–2.4)

## 2016-06-08 SURGERY — INSERTION OF GASTROSTOMY TUBE
Anesthesia: General

## 2016-06-08 MED ORDER — PROPOFOL 10 MG/ML IV BOLUS
INTRAVENOUS | Status: AC
  Filled 2016-06-08: qty 20

## 2016-06-08 MED ORDER — MIDAZOLAM HCL 2 MG/2ML IJ SOLN
INTRAMUSCULAR | Status: AC
  Filled 2016-06-08: qty 2

## 2016-06-08 MED ORDER — FENTANYL CITRATE (PF) 250 MCG/5ML IJ SOLN
INTRAMUSCULAR | Status: AC
  Filled 2016-06-08: qty 5

## 2016-06-08 MED ORDER — DEXTROSE 50 % IV SOLN
INTRAVENOUS | Status: AC
  Filled 2016-06-08: qty 50

## 2016-06-08 MED ORDER — MIDODRINE HCL 5 MG PO TABS
10.0000 mg | ORAL_TABLET | Freq: Three times a day (TID) | ORAL | Status: DC
  Administered 2016-06-08 – 2016-06-11 (×12): 10 mg
  Filled 2016-06-08 (×15): qty 2

## 2016-06-08 MED ORDER — DEXTROSE 50 % IV SOLN
25.0000 mL | Freq: Once | INTRAVENOUS | Status: AC
  Administered 2016-06-08: 25 mL via INTRAVENOUS

## 2016-06-08 NOTE — Progress Notes (Signed)
General surgery  Surgery for placement of feeding gastrostomy tube today has been canceled by anesthesia due to hemodynamic instability. It is not felt that he could tolerate the myocardial depression associated with general anesthesia and muscle relaxation. Since this is not an emergency operation, I cannot disagree. Discussed with Dr. Sampson Goon. Discussed with Dr. Molli Knock Discussed with to him nursing staff.  It is not clear to me whether he will become a candidate for general anesthesia in the future.   Omar [name]. Derrell Lolling, M.D., [facility] General and Minimally invasive Surgery Breast and Colorectal Surgery 0

## 2016-06-08 NOTE — Progress Notes (Signed)
15 Days Post-Op  Subjective: Awake.  Seems to understand that we are planning rgery and gastrostomy tube placement today.  He nods his approval of this procedure BP in the 70s to 90s, but apparently that is typical for him according to nursing staff  CT abdomen reviewed.  Anatomy of stomach and colon reviewed.  Hopefully stomach will be able to descend below costal margin to allow gastrostomy tube placement in OR today  My biggest concern is thrombocytopenia and potential for bleeding in OR. Last CBC yesterday reveals hemoglobin 7.7, platelet count 45,000 and this has been declining. CBC, PT, PTT pending this morning If platelet count less than 50,000 or any significant coagulopathy, will consider evaluation of coagulopathy prior to open surgical event.    Objective: Vital signs in last 24 hours: Temp:  [98 F (36.7 C)-98.8 F (37.1 C)] 98.8 F (37.1 C) (04/05 0319) Pulse Rate:  [104-135] 114 (04/05 0357) Resp:  [17-30] 25 (04/05 0357) BP: (77-105)/(42-63) 94/51 (04/05 0357) SpO2:  [93 %-100 %] 98 % (04/05 0357) FiO2 (%):  [28 %] 28 % (04/05 0357) Last BM Date: 06/07/16  Intake/Output from previous day: 04/04 0701 - 04/05 0700 In: 855 [NG/GT:755; IV Piggyback:100] Out: 318 [Urine:55; Stool:50] Intake/Output this shift: Total I/O In: 275 [NG/GT:275] Out: 15 [Urine:15]  General appearance: Morbidly obese.  Markedly deconditioned.  Opens eyes.  Answers questions appropriately.  Expresses some understanding about need for surgery for feeding tube and approves of this. Neck: no adenopathy, no carotid bruit, no JVD, supple, symmetrical, trachea midline, thyroid not enlarged, symmetric, no tenderness/mass/nodules and Tracheostomy in place functioning normally.  No bleeding GI: Morbidly obese.  Soft.  No scars or hernias.  No palpable mass.  Lab Results:   Recent Labs  06/06/16 0420 06/07/16 0435  WBC 5.0 4.1  HGB 8.1* 7.7*  HCT 25.2* 25.0*  PLT 50* 45*   BMET  Recent  Labs  06/07/16 0435 06/07/16 1428  NA 136 135  K 4.0 4.3  CL 103 103  CO2 28 27  GLUCOSE 114* 104*  BUN 36* 41*  CREATININE 1.45* 1.83*  CALCIUM 7.9* 7.8*   PT/INR No results for input(s): LABPROT, INR in the last 72 hours. ABG No results for input(s): PHART, HCO3 in the last 72 hours.  Invalid input(s): PCO2, PO2  Studies/Results: Ct Abdomen Wo Contrast  Result Date: 06/06/2016 CLINICAL DATA:  Dysphagia, assess stomach anatomy for possible percutaneous gastrostomy placement. EXAM: CT ABDOMEN WITHOUT CONTRAST TECHNIQUE: Multidetector CT imaging of the abdomen was performed following the standard protocol without IV contrast. COMPARISON:  05/20/2016 FINDINGS: Lower chest: Limited with motion artifact. Bibasilar atelectasis and mild consolidation, worse on the right with trace pleural effusions. Heart is mildly enlarged. No pericardial effusion. No hiatal hernia. Hepatobiliary: No significant focal hepatic abnormality. Atrophy noted of the left hepatic lobe chronically. No biliary obstruction. Gallstones layer within the mildly distended gallbladder. Common bile duct is nondilated. Pancreas: Limited assessment.  No pancreatic ductal dilatation. Spleen: Normal in size without focal abnormality. Adrenals/Urinary Tract: Normal adrenal glands. No renal obstruction or hydronephrosis. Stable probable hypodense bilateral renal cyst. Stomach/Bowel: Feeding tube is coiled within the stomach. Stomach is highly positioned within the epigastric region but beneath the left subcostal margin as before and just superior to the transverse colon. Because the high position, the overlying costal margin and the close proximity to the transverse colon percutaneous gastric access is high risk for complication. Considered surgical referral for gastrostomy insertion. Vascular/Lymphatic: Tortuosity atherosclerosis noted of the aortoiliac  vessels. No gross adenopathy. Large heterogeneous right retroperitoneal hematoma  again noted. No significant interval enlargement or change. Other: No new fluid collection or abscess. Negative for ascites. Diffuse body anasarca noted. Musculoskeletal: Degenerative changes noted of the spine. IMPRESSION: High positioned stomach beneath the left subcostal margin and close proximity to the transverse colon. Similar to the 05/20/2016 exam. See above comment. Recommend surgical referral for gastrostomy insertion. Bibasilar mild atelectasis/ consolidation and trace pleural effusions. Cholelithiasis Stable large right retroperitoneal hematoma Aortoiliac atherosclerosis Electronically Signed   By: Judie Petit.  Shick M.D.   On: 06/06/2016 17:25    Anti-infectives: Anti-infectives    Start     Dose/Rate Route Frequency Ordered Stop   06/20/2016 0900  meropenem (MERREM) 500 mg in sodium chloride 0.9 % 50 mL IVPB     500 mg 100 mL/hr over 30 Minutes Intravenous Every 24 hours 06/07/16 0955 06/11/16 2359   06/22/2016 0600  cefoTEtan (CEFOTAN) 2 g in dextrose 5 % 50 mL IVPB     2 g 100 mL/hr over 30 Minutes Intravenous To [facility]Stay [facility] 06/07/16 1354 06/09/16 0600   06/06/16 1200  vancomycin (VANCOCIN) 1,500 mg in sodium chloride 0.9 % 500 mL IVPB  Status:  Discontinued     1,500 mg 250 mL/hr over 120 Minutes Intravenous Every 24 hours 06/06/16 0818 06/07/16 0954   06/05/16 1000  meropenem (MERREM) 1 g in sodium chloride 0.9 % 100 mL IVPB  Status:  Discontinued     1 g 200 mL/hr over 30 Minutes Intravenous Every 12 hours 06/05/16 0926 06/07/16 0955   06/04/16 1400  vancomycin (VANCOCIN) 2,000 mg in sodium chloride 0.9 % 500 mL IVPB     2,000 mg 250 mL/hr over 120 Minutes Intravenous  Once 06/04/16 1234 06/04/16 1737   06/04/16 1400  meropenem (MERREM) 500 mg in sodium chloride 0.9 % 50 mL IVPB  Status:  Discontinued     500 mg 100 mL/hr over 30 Minutes Intravenous Every 24 hours 06/04/16 1234 06/05/16 0926   05/30/16 2300  ceFEPIme (MAXIPIME) 1 g in dextrose 5 % 50 mL IVPB  Status:   Discontinued     1 g 100 mL/hr over 30 Minutes Intravenous Every 24 hours 05/30/16 1223 06/04/16 1231   05/27/16 1400  ceFEPIme (MAXIPIME) 2 g in dextrose 5 % 50 mL IVPB  Status:  Discontinued     2 g 100 mL/hr over 30 Minutes Intravenous Every 12 hours 05/27/16 1014 05/30/16 1223   05/27/16 1400  vancomycin (VANCOCIN) 1,500 mg in sodium chloride 0.9 % 500 mL IVPB  Status:  Discontinued     1,500 mg 250 mL/hr over 120 Minutes Intravenous Every 24 hours 05/27/16 1014 05/30/16 1224   05/26/16 1300  ceFEPIme (MAXIPIME) 1 g in dextrose 5 % 50 mL IVPB  Status:  Discontinued     1 g 100 mL/hr over 30 Minutes Intravenous Every 24 hours 05/26/16 1225 05/27/16 1014   05/26/16 1230  vancomycin (VANCOCIN) 2,500 mg in sodium chloride 0.9 % 500 mL IVPB     2,500 mg 250 mL/hr over 120 Minutes Intravenous  Once 05/26/16 1225 05/26/16 1530   05/16/16 1400  cefTRIAXone (ROCEPHIN) 2 g in dextrose 5 % 50 mL IVPB     2 g 100 mL/hr over 30 Minutes Intravenous Every 24 hours 05/15/16 1649 05/17/16 1458   05/11/16 1400  cefTRIAXone (ROCEPHIN) 1 g in dextrose 5 % 50 mL IVPB  Status:  Discontinued     1 g 100 mL/hr over  30 Minutes Intravenous Every 24 hours 05/11/16 0833 05/15/16 1649   05/09/16 1400  piperacillin-tazobactam (ZOSYN) IVPB 3.375 g  Status:  Discontinued     3.375 g 12.5 mL/hr over 240 Minutes Intravenous Every 8 hours 05/09/16 1114 05/11/16 0833      Assessment/Plan: s/p Procedure(s): TRACHEOSTOMY  Prolonged NPO status Protein calorie malnutrition - on TF H. Influenzae pneumonia - treated HCAP - back on vent Shock: hemorrhagic +/- septic - off pressors Acute renal failure - now back off CRRT Acute blood loss anemia Paroxysmal atrial fibrillation H/o HTN H/o CVA with right sided weakness Hypothyroidism Severe heparin sensitivity Thrombocytopenia Toxic metabolic encephalopathy - mental status improving Right proximal humerus fracture   ID - Zosyn 3/06 >> 3/08, Rocephin (H.Flu  PNA) 3/08 >> 3/14, Vancomycin 3/23 >> 3/28, Cefepime 3/23 >>4/1, Meropenem 4/1 >>, Vancomycin 4/3 >> VTE - SCDs FEN - NPO, TF  Plan -   patient and granddaughter agreed to surgical gastrostomy tube placement                This is a high risk procedure secondary to obesity, comorbidities, tenuous hemodynamic status                The granddaughter is aware that this surgical procedure carries some risk, including the risk of death.  She accepts that risk.              Before proceeding with surgery today we need to make sure that his coag profile is acceptable.  Await lab tests       LOS: 31 days    Jonea Bukowski M 06/15/2016

## 2016-06-08 NOTE — Anesthesia Preprocedure Evaluation (Addendum)
Anesthesia Evaluation  Patient identified by MRN, date of birth, ID band Patient unresponsive    Reviewed: Allergy & Precautions, NPO status , Patient's Chart, lab work & pertinent test results  Airway Mallampati: Trach       Dental  (+) Poor Dentition   Pulmonary asthma , pneumonia, former smoker,  Trached for hypoxic respiratory failure.   Pulmonary exam normal        Cardiovascular hypertension, Pt. on medications and Pt. on home beta blockers  Rhythm:Regular Rate:Tachycardia  Technically difficult study. Definity contrast given. LVEF   65-70%, moderate LVH, normal wall motion, normal LA size, moderate TR, RVSP 39 mmHg + RAP.   Neuro/Psych CVA negative psych ROS   GI/Hepatic Neg liver ROS, Malnutrition   Endo/Other  Hypothyroidism Morbid obesity  Renal/GU ARFRenal disease     Musculoskeletal   Abdominal (+) + obese,   Peds  Hematology  (+) Blood dyscrasia (thrombocytopenia), anemia ,   Anesthesia Other Findings   Reproductive/Obstetrics                             Lab Results  Component Value Date   WBC 5.1 Jun 17, 2016   HGB 7.3 (L) June 17, 2016   HCT 23.3 (L) 2016/06/17   MCV 111.0 (H) 17-Jun-2016   PLT 52 (L) Jun 17, 2016   Lab Results  Component Value Date   CREATININE 2.71 (H) 06-17-16   BUN 59 (H) 2016-06-17   NA 134 (L) 06-17-16   K 4.2 June 17, 2016   CL 102 June 17, 2016   CO2 26 2016-06-17    Anesthesia Physical  Anesthesia Plan  ASA: III  Anesthesia Plan: General   Post-op Pain Management:    Induction: Inhalational  Airway Management Planned: Tracheostomy  Additional Equipment:   Intra-op Plan:   Post-operative Plan: Post-operative intubation/ventilation  Informed Consent: I have reviewed the patients History and Physical, chart, labs and discussed the procedure including the risks, benefits and alternatives for the proposed anesthesia with the patient or  authorized representative who has indicated his/her understanding and acceptance.     Plan Discussed with: CRNA and Surgeon  Anesthesia Plan Comments: (Pt with consistently low BP in pre-op (71/29mmHg). Case cancelled due to severe hypotension for elective open gastrostomy tube. Pt will need to be hemodynamically stable prior to surgery. )       Anesthesia Quick Evaluation

## 2016-06-08 NOTE — Progress Notes (Signed)
PCCM Progress Note  Admission date: May 26, 2016 Referring provider: Dr. Clarene Duke, [facility] ER  CC: altered mental status  HPI: 71 y/o male from [facility] admitted 3/5 with altered mental status + H.flu PNA with aspiration, sepsis, hypoxic/hypercapnic respiratory failure. Course c/b retroperitoneal bleed while on heparin with subsequent shock, acute renal failure & prolonged ventilation requiring tracheostomy. On CRRT intermittently through hosp.    has a past medical history of Asthma; Atrial fibrillation (HCC); Gout; Hypertension; Morbid obesity (HCC); Multinodular goiter; Osteoarthrosis and allied disorders; Other and unspecified hyperlipidemia; Stroke [facility]); Thrombocytopenia (HCC); and Vitamin D deficiency.  Antibiotics: Zosyn 3/06 >> 3/08 Rocephin (H.Flu PNA) 3/08 >> 3/14 Vancomycin 3/23 >> 3/28 Cefepime 3/23 >> 4/1 Meropenem 4/1 >> Vancomycin 4/3 >> 4/4  Cultures: Urine 3/05 >> negative Blood 3/06 >> negative Resp 3/6 >> H flu (b-lactamase +) Trach stoma site 3/23 >> diphtheroids Resp cx 3/28 >>nl flora Blood 4/1 NGTD  Lines/tubes: ETT 3/05 >> 3/8 ETT 3/10 >> 3/21 Trach (Dr Jenne Pane) 3/21 >>  Lt IJ CVL 3/07 >> 3/27 R HD catheter  >> 3/22 R IJ HD catheter 3/23 >>   Events: 3/05  Transfer to [facility] Sputum 3/06 >> Haemophilus influenzae (beta lactamase positive) Echo 3/06 >> EF 65 to 70%, mod LVH, mod TR, PAS 39 mmHg CT head 3/7 >> no acute abnormality  3/10 - Large retroperitoneal bleed noted on CT after c/o back pain/leg pain 3/27 rt humerus fracture identified - ortho consult 3/27 RUQ [place] .Marland Kitchen Not helpful due to body habitus. On ATC with copious secrtions.  3/29 - Chronic  critically ill 3/27 - end CRRT 3/30 - doing ATC x 24-48h per RN. Rt shoulder pain only when touched per RN. Making urine. Off CRRT. Not on pressors. Family not fully on board about LTAC per RN but looking at [facility]. Needs LTAC.  4/2 - resumed CRRT 4/3 - Family agreeable to PEG tube 4/5 - Gastric tube to be placed by  gen surg  SUBJECTIVE/OVERNIGHT/INTERVAL HX   Vital signs: BP (!) 94/51   Pulse (!) 114   Temp 98.2 F (36.8 C) (Oral)   Resp (!) 23   Ht  (1.676 m)   Wt (!) 340 lb (154.2 kg) Comment: I feel this weight may be inaccurate  SpO2 98%   BMI 54.88 kg/m   Intake/output:  Intake/Output Summary (Last 24 hours) at 06/24/2016 0815 Last data filed at 06/27/2016 0100  Gross per 24 hour  Intake              815 ml  Output              177 ml  Net              638 ml   I/O last 3 completed shifts: In: 1435 [NG/GT:1235; IV Piggyback:200] Out: 2285 [Urine:75; Other:1610; Stool:600]  EXAM General: [name]. Obese HENT: [place]/AT. No obv abnormality. EOMI. +TRACH, +NG Lungs: Coarse breath sounds throughout Chest wall: not tender to palp Heart: RRR. On pressors Abdomen: soft, non tender, not distended. +bs Extremities: 3+ edema BL Neuro: Alert and interactive.   LABS  PULMONARY  Recent Labs Lab 06/04/16 1550  PHART 7.387  PCO2ART 46.5  PO2ART 89.2  HCO3 26.9  O2SAT 96.5    CBC  Recent Labs Lab 06/06/16 0420 06/07/16 0435 06/20/2016 0500  HGB 8.1* 7.7* 7.3*  HCT 25.2* 25.0* PENDING  WBC 5.0 4.1 5.1  PLT 50* 45* 52*    COAGULATION  Recent Labs Lab  06/01/16 1801 06/20/2016 0550  INR 1.32 1.33    CARDIAC  No results for input(s): TROPONINI in the last 168 hours. No results for input(s): PROBNP in the last 168 hours.   CHEMISTRY  Recent Labs Lab 06/04/16 0442  06/05/16 0549  06/06/16 0500 06/06/16 1600 06/07/16 0435 06/07/16 1428 06/07/2016 0500  NA 135  < > 134*  < > 134* 135 136 135 134*  K 3.6  < > 4.5  < > 4.1 4.2 4.0 4.3 4.2  CL 97*  < > 98*  < > 101 104 103 103 102  CO2 29  < > 25  < > GLUCOSE 126*  < > 125*  < > 112* 116* 114* 104* 91  BUN 96*  < > 131*  < > 64* 46* 36* 41* 59*  CREATININE 2.77*  < > 3.96*  < > 2.13* 1.86* 1.45* 1.83* 2.71*  CALCIUM 7.8*  < > 8.3*  < > 8.1* 8.0* 7.9* 7.8* 8.0*  MG 1.8  --   2.0  --  2.2  --  2.1  --  2.2  PHOS 3.4  --  5.0*  < > 3.3 2.9 2.8 2.7 3.4  < > = values in this interval not displayed. Estimated Creatinine Clearance: 34.9 mL/min (A) (by C-G formula based on SCr of 2.71 mg/dL (H)).  LIVER  Recent Labs Lab 06/01/16 1801  06/06/16 0500 06/06/16 1600 06/07/16 0435 06/07/16 1428 06/27/2016 0500 06/06/2016 0550  ALBUMIN  --   < > 2.0* 1.9* 1.8* 1.7* 1.7*  --   INR 1.32  --   --   --   --   --   --  1.33  < > = values in this interval not displayed. INFECTIOUS  Recent Labs Lab 06/04/16 1350 06/05/16 0549 06/06/16 0500  LATICACIDVEN 1.3  --   --   PROCALCITON 6.13 6.68 4.22     ENDOCRINE CBG (last 3)   Recent Labs  06/07/16 2344 06/11/2016 0320 06/07/2016 0741  GLUCAP 95 89 80    IMAGING x48h  - image(s) personally visualized  -   highlighted in bold Ct Abdomen Wo Contrast  Result Date: 06/06/2016 CLINICAL DATA:  Dysphagia, assess stomach anatomy for possible percutaneous gastrostomy placement. EXAM: CT ABDOMEN WITHOUT CONTRAST TECHNIQUE: Multidetector CT imaging of the abdomen was performed following the standard protocol without IV contrast. COMPARISON:  05/20/2016 FINDINGS: Lower chest: Limited with motion artifact. Bibasilar atelectasis and mild consolidation, worse on the right with trace pleural effusions. Heart is mildly enlarged. No pericardial effusion. No hiatal hernia. Hepatobiliary: No significant focal hepatic abnormality. Atrophy noted of the left hepatic lobe chronically. No biliary obstruction. Gallstones layer within the mildly distended gallbladder. Common bile duct is nondilated. Pancreas: Limited assessment.  No pancreatic ductal dilatation. Spleen: Normal in size without focal abnormality. Adrenals/Urinary Tract: Normal adrenal glands. No renal obstruction or hydronephrosis. Stable probable hypodense bilateral renal cyst. Stomach/Bowel: Feeding tube is coiled within the stomach. Stomach is highly positioned within the epigastric  region but beneath the left subcostal margin as before and just superior to the transverse colon. Because the high position, the overlying costal margin and the close proximity to the transverse colon percutaneous gastric access is high risk for complication. Considered surgical referral for gastrostomy insertion. Vascular/Lymphatic: Tortuosity atherosclerosis noted of the aortoiliac vessels. No gross adenopathy. Large heterogeneous right retroperitoneal hematoma again noted. No significant interval enlargement or change. Other: No new fluid collection or abscess. Negative  for ascites. Diffuse body anasarca noted. Musculoskeletal: Degenerative changes noted of the spine. IMPRESSION: High positioned stomach beneath the left subcostal margin and close proximity to the transverse colon. Similar to the 05/20/2016 exam. See above comment. Recommend surgical referral for gastrostomy insertion. Bibasilar mild atelectasis/ consolidation and trace pleural effusions. Cholelithiasis Stable large right retroperitoneal hematoma Aortoiliac atherosclerosis Electronically Signed   By: Judie Petit.  Shick M.D.   On: 06/06/2016 17:25   DISCUSSION: 71 year old man, history of fibrillation, obesity, admitted with respiratory failure and septic shock in the setting of H flu pneumonia. Subsequent course complicated by retroperitoneal bleeding on anticoagulation, hemorrhagic shock, acute respiratory failure and acute renal failure. s/p trach. Currently back on vent and CRRT vs HD. Unfortunately has rt humerus fracture -conservative mx  ASSESSMENT / PLAN:  PULMONARY A:Acute respiratory failure with chronoic resp failure - ATC since 05/30/16 Pneumonia, H influenza (beta lactamase positive) Presumed OSA/OHS History of asthma Bilateral pulm infiltrates / effusions, consistent w volume overload 06/04/16 Back on vent due to sepsis syndrome, likely HCAP  P:  Vent/trach as tol Cont IV Meropenem  CARDIOVASCULAR A:  Shock, principally  hemorrhagic however perhaps component of septic Paroxysmal atrial fibrillation History of hypertension 3/31-off pressors 06/04/16 - hypotension, back on pressors 4/2 cortisol 42.6  PLAN Off stress dose steroids, continue to hold anti-hypertensives. Req pressors 4/1, currently off but may require re initiation  RENAL I/O last 3 completed shifts: In: 1435 [NG/GT:1235; IV Piggyback:200] Out: 2285 [Urine:75; Other:1610; Stool:600] Lab Results  Component Value Date   CREATININE 2.71 (H) 06/13/2016   CREATININE 1.83 (H) 06/07/2016   CREATININE 1.45 (H) 06/07/2016   CREATININE 0.80 07/16/2012   A:   Acute renal failure, presumed due to ATN in the setting of hemorrhagic shock 3/30 - creat rising but making urine 3/31 - needs HD  06/04/16 - look dry 4/3 - volume up --> CRRT 4/5 - Started midodrine   P: Pt not responding to lasix but nephro unable to initiate HD 2/2 hypotension. May need CRRT again. If no change in volume today, will have CRRT tomorrow. nephro recs stop CRRT, P HD. May be limited due to pressures Midodrine 10 mg TID started today per nephro for BP support  GASTROINTESTINAL A:  Large retroperitoneal bleed noted 3/10, some slight increased 3/18 CT scan, hemoglobin currently stable Shock liver, transaminitis, improving Hepatic lesion; noted on CT scan of the abdomen 3/18. 5 cm lesion of the dome of the liver, could represent blood or infection or infarct.  Protein calorie malnutrition Family OK PEG tube - IR unable to place due to superior location and proximity of colon, gen surg to place gastric tube  P:   Continue PPI Continue tube feeds Gen surg to place gastric tube 4/5  HEMATOLOGIC  Recent Labs Lab 06/06/16 0420 06/07/16 0435 06/28/2016 0500  HGB 8.1* 7.7* 7.3*  HCT 25.2* 25.0* PENDING  WBC 5.0 4.1 5.1  PLT 50* 45* 52*   A:   Acute blood loss anemia, improved Severe heparin sensitivity, associated with hemorrhage Thrombocytopenia, acute on chronic -came  in low at 140k Aranesp 4/2  P:  Follow CBC, Goal hemoglobin greater than 7.0. Currently 7.7 No heparin ever Hold anticoagulation  SCD for DVT prophylaxis Plts 52,000 today  INFECTIOUS A:   H. influenzae pneumonia, treated HCAP Septic shock ? Clinical significance hepatic lesion, ? Possible abscess  3/30 - on abx. Not on pressors 06/04/16 - likely HCAP, started back on pressors  4/3 off pressors   P: Antibiotics for  initial pneumonia were completed Cefepime, started 3/23, changed to Merrem 06/04/16 --> and Vanc 4/3 >> No leukocytosis Pan culture: Tracheal asp with few GNR, GP cocci in pairs and clusters -- culture still pending  ENDOCRINE CBG (last 3)   Recent Labs  06/07/16 2344 06/11/2016 0320 06/12/2016 0741  GLUCAP 95 89 80   A:   Hyperglycemia, on steroids Hypothyroidism P:  Continue SSI, CBG Continue Synthroid  NEUROLOGIC A: Toxic metabolic encephalopathy, mental status improving History of CVA with residual deficits Rt humerus fracture  3/31 - follows comands 4/1 - more tired due to sepsis  P:  PT consult - LTAC recommended, family unsure.  RUE sling Morphine prn pain   MSK A Rt shoulder fracture during hospital stay P: sling + conserv management  FAMILY DISCUSSIONS: Family OK with PEG tube as of 4/3. Family hostile, see prior notes. LTAC recommended, granddaughter requests staying at [facility]. Granddaughter HCPOA  Inter-disciplinary family meet or Palliative Care meeting due by: done  CODE STATUS Full Code  DISPO Keep in ICU given above issues  The patient is critically ill with multiple organ systems failure and requires high complexity decision making for assessment and support, frequent evaluation and titration of therapies, application of advanced monitoring technologies and extensive interpretation of multiple databases.   Demoni Parmar, D.O. Internal Medicine Resident PGY1 Pager [phone]

## 2016-06-08 NOTE — Progress Notes (Signed)
Subjective: Interval History: has no complaint. On trach but understands.  Objective: Vital signs in last 24 hours: Temp:  [98 F (36.7 C)-98.8 F (37.1 C)] 98.8 F (37.1 C) (04/05 0319) Pulse Rate:  [104-135] 114 (04/05 0357) Resp:  [17-30] 25 (04/05 0357) BP: (77-105)/(42-63) 94/51 (04/05 0357) SpO2:  [93 %-100 %] 98 % (04/05 0357) FiO2 (%):  [28 %] 28 % (04/05 0357) Weight change:   Intake/Output from previous day: 04/04 0701 - 04/05 0700 In: 855 [NG/GT:755; IV Piggyback:100] Out: 318 [Urine:55; Stool:50] Intake/Output this shift: Total I/O In: 275 [NG/GT:275] Out: 15 [Urine:15]  General appearance: alert, cooperative, no distress and morbidly obese Neck: trach, RIJ cath Resp: rhonchi bilaterally and wheezes bilaterally Cardio: irregularly irregular rhythm GI: obese, pos bs,.  2-3+. R arm slingremities: edema 2-3+. R arm sling and none  Lab Results:  Recent Labs  06/06/16 0420 06/07/16 0435  WBC 5.0 4.1  HGB 8.1* 7.7*  HCT 25.2* 25.0*  PLT 50* 45*   BMET:  Recent Labs  06/07/16 0435 06/07/16 1428  NA 136 135  K 4.0 4.3  CL 103 103  CO2 28 27  GLUCOSE 114* 104*  BUN 36* 41*  CREATININE 1.45* 1.83*  CALCIUM 7.9* 7.8*   No results for input(s): PTH in the last 72 hours. Iron Studies: No results for input(s): IRON, TIBC, TRANSFERRIN, FERRITIN in the last 72 hours.  Studies/Results: Ct Abdomen Wo Contrast  Result Date: 06/06/2016 CLINICAL DATA:  Dysphagia, assess stomach anatomy for possible percutaneous gastrostomy placement. EXAM: CT ABDOMEN WITHOUT CONTRAST TECHNIQUE: Multidetector CT imaging of the abdomen was performed following the standard protocol without IV contrast. COMPARISON:  05/20/2016 FINDINGS: Lower chest: Limited with motion artifact. Bibasilar atelectasis and mild consolidation, worse on the right with trace pleural effusions. Heart is mildly enlarged. No pericardial effusion. No hiatal hernia. Hepatobiliary: No significant focal hepatic  abnormality. Atrophy noted of the left hepatic lobe chronically. No biliary obstruction. Gallstones layer within the mildly distended gallbladder. Common bile duct is nondilated. Pancreas: Limited assessment.  No pancreatic ductal dilatation. Spleen: Normal in size without focal abnormality. Adrenals/Urinary Tract: Normal adrenal glands. No renal obstruction or hydronephrosis. Stable probable hypodense bilateral renal cyst. Stomach/Bowel: Feeding tube is coiled within the stomach. Stomach is highly positioned within the epigastric region but beneath the left subcostal margin as before and just superior to the transverse colon. Because the high position, the overlying costal margin and the close proximity to the transverse colon percutaneous gastric access is high risk for complication. Considered surgical referral for gastrostomy insertion. Vascular/Lymphatic: Tortuosity atherosclerosis noted of the aortoiliac vessels. No gross adenopathy. Large heterogeneous right retroperitoneal hematoma again noted. No significant interval enlargement or change. Other: No new fluid collection or abscess. Negative for ascites. Diffuse body anasarca noted. Musculoskeletal: Degenerative changes noted of the spine. IMPRESSION: High positioned stomach beneath the left subcostal margin and close proximity to the transverse colon. Similar to the 05/20/2016 exam. See above comment. Recommend surgical referral for gastrostomy insertion. Bibasilar mild atelectasis/ consolidation and trace pleural effusions. Cholelithiasis Stable large right retroperitoneal hematoma Aortoiliac atherosclerosis Electronically Signed   By: Judie Petit.  Shick M.D.   On: 06/06/2016 17:25    I have reviewed the patient's current medications.  Assessment/Plan: 1 AKI oliguric, no response to Lasix,stop.  Solute/acid/base/K ok. Vol xs.  BP low, if not better will not be candidate for IHD, will need resume CRRT.  Not candidate at this time for long term unless  improves. 2 Anemia esa/Fe 3 Low Ptlt  4 Nutrition ?surg PEG 5 DM 6 Massive obesity 7 Resp  Trach, tenuous 8 Arm fx 9 RP bleed 10 low bps start Mido P follow chem, vol today , if no change CRRT tomorrow.  ? PEG, Start Mido   LOS: 31 days   Omar [name] L 06/23/2016,6:54 AM

## 2016-06-08 NOTE — Progress Notes (Signed)
G tube placement canceled due to hypotension, brought the pt back into ICU in bed.  BP 88/52, HR 97. Not in any distress. Will keep close monitoring.   Danne [place]

## 2016-06-08 NOTE — Progress Notes (Signed)
  Speech Language Pathology Treatment: Omar [name] Speaking valve  Patient Details Name: Omar [name] MRN: [id number] DOB: 05-17-1945 Today's Date: 06/09/16 Time: 0922-0943 SLP Time Calculation (min) (ACUTE ONLY): 21 min  Assessment / Plan / Recommendation Clinical Impression  SLP followed up for PMSV trials. Pt on trach collar on 28% and semi alert with hx of fluctuation in mentation. RN suctioned prior to PMV placement with mild to moderate frothy secretions. PMV placed with stable HR, RR, and O2. Pt primarily aphonic with significantly reduced vocal intensity resulting in poor speech intelligibility. With agitation pt with occasional audible phonatory output, breathy and hoarse in nature. Pt inconsistently following commands. Pt utilized PMV for 20 minutes, removed intermittently x2 with mild air trapping noted, however vitals remained stable throughout usage. Consulted with MD for possible trach downsize with hopes of improved airway patency for phonation. SLP to continue to monitor.       HPI HPI: 71 y/o male from [facility] admitted 3/5 with altered mental status with aspiration, sepsis, hypoxic/hypercapnic respiratory failure. Course c/b retroperitoneal bleed, shock, acute renal failure & prolonged ventilation requiring tracheostomy.  ETT 3/05-3/08; 3/10 - trach 3/21. CRRT.      SLP Plan  Continue with current plan of care       Recommendations         Patient may use Passy-Muir Speech Valve: with SLP only PMSV Supervision: Full MD: Please consider changing trach tube to : Smaller size         Oral Care Recommendations: Oral care QID SLP Visit Diagnosis: Aphonia (R49.1) Plan: Continue with current plan of care       GO               Omar [name], CCC-SLP Acute Care Speech Language Pathologist    Omar [name] 06/09/16, 10:24 AM

## 2016-06-08 NOTE — Progress Notes (Signed)
PCCM Progress Note  Admission date: 05/20/2016 Referring provider: Dr. Clarene Duke, [facility] ER  CC: altered mental status  HPI: 71 y/o male from [facility] admitted 3/5 with altered mental status + H.flu PNA with aspiration, sepsis, hypoxic/hypercapnic respiratory failure. Course c/b retroperitoneal bleed while on heparin with subsequent shock, acute renal failure & prolonged ventilation requiring tracheostomy. On CRRT intermittently through hosp.    has a past medical history of Asthma; Atrial fibrillation (HCC); Gout; Hypertension; Morbid obesity (HCC); Multinodular goiter; Osteoarthrosis and allied disorders; Other and unspecified hyperlipidemia; Stroke [facility]); Thrombocytopenia (HCC); and Vitamin D deficiency.  Antibiotics: Zosyn 3/06 >> 3/08 Rocephin (H.Flu PNA) 3/08 >> 3/14 Vancomycin 3/23 >> 3/28 Cefepime 3/23 >> 4/1 Meropenem 4/1 >> Vancomycin 4/3 >> 4/4  Cultures: Urine 3/05 >> negative Blood 3/06 >> negative Resp 3/6 >> H flu (b-lactamase +) Trach stoma site 3/23 >> diphtheroids Resp cx 3/28 >>nl flora Blood 4/1 NGTD  Lines/tubes: ETT 3/05 >> 3/8 ETT 3/10 >> 3/21 Trach (Dr Jenne Pane) 3/21 >>  Lt IJ CVL 3/07 >> 3/27 R HD catheter  >> 3/22 R IJ HD catheter 3/23 >>   Events: 3/05  Transfer to [facility] Sputum 3/06 >> Haemophilus influenzae (beta lactamase positive) Echo 3/06 >> EF 65 to 70%, mod LVH, mod TR, PAS 39 mmHg CT head 3/7 >> no acute abnormality  3/10 - Large retroperitoneal bleed noted on CT after c/o back pain/leg pain 3/27 rt humerus fracture identified - ortho consult 3/27 RUQ [place] .Marland Kitchen Not helpful due to body habitus. On ATC with copious secrtions.  3/29 - Chronic  critically ill 3/27 - end CRRT 3/30 - doing ATC x 24-48h per RN. Rt shoulder pain only when touched per RN. Making urine. Off CRRT. Not on pressors. Family not fully on board about LTAC per RN but looking at [facility]. Needs LTAC.  4/2 - resumed CRRT 4/3 - Family agreeable to PEG tube 4/5 - Gastric tube to be placed by  gen surg  SUBJECTIVE/OVERNIGHT/INTERVAL HX No events overnight  Vital signs: BP (!) 89/49 (BP Location: Left Arm)   Pulse (!) 114   Temp 98.2 F (36.8 C) (Oral)   Resp (!) 25   Ht  (1.676 m)   Wt (!) 154.2 kg (340 lb) Comment: I feel this weight may be inaccurate  SpO2 99%   BMI 54.88 kg/m   Intake/output:  Intake/Output Summary (Last 24 hours) at 06/05/2016 0848 Last data filed at 06/04/2016 0100  Gross per 24 hour  Intake              815 ml  Output              177 ml  Net              638 ml   I/O last 3 completed shifts: In: 1435 [NG/GT:1235; IV Piggyback:200] Out: 2285 [Urine:75; Other:1610; Stool:600]  EXAM General: [name]. Obese HENT: [place]/AT. No obv abnormality. EOMI. +TRACH, +NG Lungs: Coarse breath sounds throughout Chest: not tender to palp Heart: RRR. On pressors Abdomen: soft, non tender, not distended. +bs Extremities: 3+ edema BL Neuro: Alert and interactive.   LABS  PULMONARY  Recent Labs Lab 06/04/16 1550  PHART 7.387  PCO2ART 46.5  PO2ART 89.2  HCO3 26.9  O2SAT 96.5   CBC  Recent Labs Lab 06/06/16 0420 06/07/16 0435 06/23/2016 0500  HGB 8.1* 7.7* 7.3*  HCT 25.2* 25.0* PENDING  WBC 5.0 4.1 5.1  PLT 50* 45* 52*   COAGULATION  Recent Labs Lab 06/01/16 1801 06/16/2016 0550  INR 1.32 1.33   CARDIAC  No results for input(s): TROPONINI in the last 168 hours. No results for input(s): PROBNP in the last 168 hours.  CHEMISTRY  Recent Labs Lab 06/04/16 0442  06/05/16 0549  06/06/16 0500 06/06/16 1600 06/07/16 0435 06/07/16 1428 06/09/2016 0500  NA 135  < > 134*  < > 134* 135 136 135 134*  K 3.6  < > 4.5  < > 4.1 4.2 4.0 4.3 4.2  CL 97*  < > 98*  < > 101 104 103 103 102  CO2 29  < > 25  < > GLUCOSE 126*  < > 125*  < > 112* 116* 114* 104* 91  BUN 96*  < > 131*  < > 64* 46* 36* 41* 59*  CREATININE 2.77*  < > 3.96*  < > 2.13* 1.86* 1.45* 1.83* 2.71*  CALCIUM 7.8*  < > 8.3*  < > 8.1* 8.0*  7.9* 7.8* 8.0*  MG 1.8  --  2.0  --  2.2  --  2.1  --  2.2  PHOS 3.4  --  5.0*  < > 3.3 2.9 2.8 2.7 3.4  < > = values in this interval not displayed. Estimated Creatinine Clearance: 34.9 mL/min (A) (by C-G formula based on SCr of 2.71 mg/dL (H)).  LIVER  Recent Labs Lab 06/01/16 1801  06/06/16 0500 06/06/16 1600 06/07/16 0435 06/07/16 1428 06/23/2016 0500 06/19/2016 0550  ALBUMIN  --   < > 2.0* 1.9* 1.8* 1.7* 1.7*  --   INR 1.32  --   --   --   --   --   --  1.33  < > = values in this interval not displayed. INFECTIOUS  Recent Labs Lab 06/04/16 1350 06/05/16 0549 06/06/16 0500  LATICACIDVEN 1.3  --   --   PROCALCITON 6.13 6.68 4.22   ENDOCRINE CBG (last 3)   Recent Labs  06/07/16 2344 07/02/2016 0320 07/03/2016 0741  GLUCAP 95 89 80   IMAGING x48h  - image(s) personally visualized  -   highlighted in bold Ct Abdomen Wo Contrast  Result Date: 06/06/2016 CLINICAL DATA:  Dysphagia, assess stomach anatomy for possible percutaneous gastrostomy placement. EXAM: CT ABDOMEN WITHOUT CONTRAST TECHNIQUE: Multidetector CT imaging of the abdomen was performed following the standard protocol without IV contrast. COMPARISON:  05/20/2016 FINDINGS: Lower chest: Limited with motion artifact. Bibasilar atelectasis and mild consolidation, worse on the right with trace pleural effusions. Heart is mildly enlarged. No pericardial effusion. No hiatal hernia. Hepatobiliary: No significant focal hepatic abnormality. Atrophy noted of the left hepatic lobe chronically. No biliary obstruction. Gallstones layer within the mildly distended gallbladder. Common bile duct is nondilated. Pancreas: Limited assessment.  No pancreatic ductal dilatation. Spleen: Normal in size without focal abnormality. Adrenals/Urinary Tract: Normal adrenal glands. No renal obstruction or hydronephrosis. Stable probable hypodense bilateral renal cyst. Stomach/Bowel: Feeding tube is coiled within the stomach. Stomach is highly positioned  within the epigastric region but beneath the left subcostal margin as before and just superior to the transverse colon. Because the high position, the overlying costal margin and the close proximity to the transverse colon percutaneous gastric access is high risk for complication. Considered surgical referral for gastrostomy insertion. Vascular/Lymphatic: Tortuosity atherosclerosis noted of the aortoiliac vessels. No gross adenopathy. Large heterogeneous right retroperitoneal hematoma again noted. No significant interval enlargement or change. Other: No new fluid collection or abscess. Negative for ascites.  Diffuse body anasarca noted. Musculoskeletal: Degenerative changes noted of the spine. IMPRESSION: High positioned stomach beneath the left subcostal margin and close proximity to the transverse colon. Similar to the 05/20/2016 exam. See above comment. Recommend surgical referral for gastrostomy insertion. Bibasilar mild atelectasis/ consolidation and trace pleural effusions. Cholelithiasis Stable large right retroperitoneal hematoma Aortoiliac atherosclerosis Electronically Signed   By: Judie Petit.  Shick M.D.   On: 06/06/2016 17:25   DISCUSSION: 71 year old man, history of fibrillation, obesity, admitted with respiratory failure and septic shock in the setting of H flu pneumonia. Subsequent course complicated by retroperitoneal bleeding on anticoagulation, hemorrhagic shock, acute respiratory failure and acute renal failure. s/p trach. Currently back on vent and CRRT vs HD. Unfortunately has rt humerus fracture.  ASSESSMENT / PLAN:  PULMONARY A:Acute respiratory failure with chronoic resp failure - ATC since 05/30/16 Pneumonia, H influenza (beta lactamase positive) Presumed OSA/OHS History of asthma Bilateral pulm infiltrates / effusions, consistent w volume overload 06/04/16 Back on vent due to sepsis syndrome, likely HCAP  P:   - TC for now but will come out of the OR on vent. - Cont IV Meropenem  4/8  CARDIOVASCULAR A:  Shock, principally hemorrhagic however perhaps component of septic Paroxysmal atrial fibrillation History of hypertension 3/31-off pressors 06/04/16 - hypotension, back on pressors 4/2 cortisol 42.6  PLAN - Off stress dose steroids, continue to hold anti-hypertensives. - Req pressors 4/1, currently off but may require re initiation  RENAL I/O last 3 completed shifts: In: 1435 [NG/GT:1235; IV Piggyback:200] Out: 2285 [Urine:75; Other:1610; Stool:600] Lab Results  Component Value Date   CREATININE 2.71 (H) 06/15/16   CREATININE 1.83 (H) 06/07/2016   CREATININE 1.45 (H) 06/07/2016   CREATININE 0.80 07/16/2012   A:   Acute renal failure, presumed due to ATN in the setting of hemorrhagic shock 3/30 - creat rising but making urine 3/31 - needs HD  06/04/16 - look dry 4/3 - volume up --> CRRT 4/5 - Started midodrine   P:  - Pt not responding to lasix but nephro unable to initiate HD 2/2 hypotension. May need CRRT again. If no change in volume today, will have CRRT tomorrow. nephro recs stop CRRT, P HD. May be limited due to pressures Midodrine 10 mg TID started today per nephro for BP support - Will likely require CRRT again rather than HD given hypotension  GASTROINTESTINAL A:  Large retroperitoneal bleed noted 3/10, some slight increased 3/18 CT scan, hemoglobin currently stable Shock liver, transaminitis, improving Hepatic lesion; noted on CT scan of the abdomen 3/18. 5 cm lesion of the dome of the liver, could represent blood or infection or infarct.  Protein calorie malnutrition Family OK PEG tube - IR unable to place due to superior location and proximity of colon, gen surg to place gastric tube  P:   - Continue PPI - Continue tube feeds - Gen surg to place gastric tube 4/5  HEMATOLOGIC  Recent Labs Lab 06/06/16 0420 06/07/16 0435 15-Jun-2016 0500  HGB 8.1* 7.7* 7.3*  HCT 25.2* 25.0* PENDING  WBC 5.0 4.1 5.1  PLT 50* 45* 52*   A:    Acute blood loss anemia, improved Severe heparin sensitivity, associated with hemorrhage Thrombocytopenia, acute on chronic -came in low at 140k Aranesp 4/2  P:  - Follow CBC, Goal hemoglobin greater than 7.0. Currently 7.7 - No heparin ever - Hold anticoagulation  - SCD for DVT prophylaxis - Plts 52,000 today, continue to monitor  INFECTIOUS A:   H. influenzae pneumonia, treated HCAP  Septic shock ? Clinical significance hepatic lesion, ? Possible abscess  3/30 - on abx. Not on pressors 06/04/16 - likely HCAP, started back on pressors  4/3 off pressors   P:  - Antibiotics for initial pneumonia were completed - Cefepime, started 3/23, changed to Merrem 06/04/16 --> and Vanc 4/3 >>4/4 - No leukocytosis - Pan culture: Tracheal asp with few GNR, GP cocci in pairs and clusters -- culture still pending  ENDOCRINE CBG (last 3)   Recent Labs  06/07/16 2344 06/07/2016 0320 06/09/2016 0741  GLUCAP 95 89 80   A:   Hyperglycemia, on steroids Hypothyroidism P:  Continue SSI, CBG Continue Synthroid  NEUROLOGIC A: Toxic metabolic encephalopathy, mental status improving History of CVA with residual deficits Rt humerus fracture  3/31 - follows comands 4/1 - more tired due to sepsis  P:   - PT consult - LTAC recommended, family unsure.  - RUE sling - Morphine prn pain   MSK A Rt shoulder fracture during hospital stay P: sling + conserv management  FAMILY DISCUSSIONS: Family OK with PEG tube as of 4/3. Family hostile, see prior notes. LTAC recommended, granddaughter requests staying at [facility]. Granddaughter HCPOA  Inter-disciplinary family meet or Palliative Care meeting due by: done  CODE STATUS Full Code  DISPO Keep in ICU given above issues, if off vent post OR will reconsider.  The patient is critically ill with multiple organ systems failure and requires high complexity decision making for assessment and support, frequent evaluation and titration of  therapies, application of advanced monitoring technologies and extensive interpretation of multiple databases.   Critical Care Time devoted to patient care services described in this note is  35  Minutes. This time reflects time of care of this signee Dr Koren Bound. This critical care time does not reflect procedure time, or teaching time or supervisory time of PA/NP/Med student/Med Resident etc but could involve care discussion time.  Alyson Reedy, M.D. [facility] Pulmonary/Critical Care Medicine. Pager: [phone]. After hours pager: [phone].

## 2016-06-08 NOTE — Progress Notes (Signed)
Dr Derrell Lolling in department, informed MD pt platlet count 45,000 on 07/07/16, will check PT INR per md.

## 2016-06-08 NOTE — Evaluation (Signed)
Clinical/Bedside Swallow Evaluation Patient Details  Name: Omar [name] MRN: [id number] Date of Birth: 1945-09-29  Today's Date: 06/14/2016 Time: SLP Start Time (ACUTE ONLY): 0944 SLP Stop Time (ACUTE ONLY): 0957 SLP Time Calculation (min) (ACUTE ONLY): 13 min  Past Medical History:  Past Medical History:  Diagnosis Date  . Asthma   . Atrial fibrillation (HCC)   . Gout   . Hypertension   . Morbid obesity (HCC)   . Multinodular goiter   . Osteoarthrosis and allied disorders   . Other and unspecified hyperlipidemia   . Stroke [facility])    right sided hemiparesis  . Thrombocytopenia (HCC)   . Vitamin D deficiency    Past Surgical History:  Past Surgical History:  Procedure Laterality Date  . THYROID SURGERY    . TRACHEOSTOMY TUBE PLACEMENT N/A 05/06/2016   Procedure: TRACHEOSTOMY;  Surgeon: Christia Reading, MD;  Location: [facility] OR;  Service: ENT;  Laterality: N/A;   HPI:  71 y/o male from [facility] admitted 3/5 with altered mental status with aspiration, sepsis, hypoxic/hypercapnic respiratory failure. Course c/b retroperitoneal bleed, shock, acute renal failure & prolonged ventilation requiring tracheostomy.  ETT 3/05-3/08; 3/10 - trach 3/21. CRRT.   Assessment / Plan / Recommendation Clinical Impression   SLP initiated PO trials this date with use of PMSV. Pt high risk for aspiration given respiratory compromise s/p tracheostomy 3/21, fluctuation in mentation, and immobility. Pt with generalized oral weakness. Oral care provided prior to ice chip trials. Pt with bolus manipulation though displayed prolonged oral transit with suspected delay in swallow initiation. Vital signs remained stable however as previously mentioned pt with primary aphonia during PMSV usage and SLP unable to assess vocal quality with PO. Recommend FEES objective swallow study to determine readiness for PO. MD reports plans for PEG placment for primary nutrition means. SLP in agreement that although pt ready for FEES, pt may  not be ready for full PO diet based on fluctuations in medical conditions including repiratory status and mentation. SLP to attempt FEES next date.    SLP Visit Diagnosis: Aphonia (R49.1);Dysphagia, unspecified (R13.10)    Aspiration Risk  Severe aspiration risk;Risk for inadequate nutrition/hydration    Diet Recommendation     Medication Administration: Via alternative means    Other  Recommendations Oral Care Recommendations: Oral care QID   Follow up Recommendations LTACH      Frequency and Duration min 3x week          Prognosis Prognosis for Safe Diet Advancement: Fair Barriers to Reach Goals: Severity of deficits      Swallow Study   General HPI: 71 y/o male from [facility] admitted 3/5 with altered mental status with aspiration, sepsis, hypoxic/hypercapnic respiratory failure. Course c/b retroperitoneal bleed, shock, acute renal failure & prolonged ventilation requiring tracheostomy.  ETT 3/05-3/08; 3/10 - trach 3/21. CRRT. Type of Study: Bedside Swallow Evaluation Previous Swallow Assessment: none on file Diet Prior to this Study: NPO Temperature Spikes Noted: No Respiratory Status: Trach Collar Behavior/Cognition: Lethargic/Drowsy;Requires cueing;Cooperative Oral Cavity Assessment: Dry Oral Care Completed by SLP: Yes Oral Cavity - Dentition: Missing dentition Self-Feeding Abilities: Total assist Patient Positioning: Upright in bed Baseline Vocal Quality: Aphonic;Other (comment) (primarily aphonic) Volitional Cough: Cognitively unable to elicit Volitional Swallow: Unable to elicit    Oral/Motor/Sensory Function Overall Oral Motor/Sensory Function: Generalized oral weakness   Ice Chips Ice chips: Impaired Presentation: Spoon Oral Phase Impairments: Reduced lingual movement/coordination Oral Phase Functional Implications: Prolonged oral transit Pharyngeal Phase Impairments: Suspected delayed Swallow;Multiple swallows  Thin Liquid Thin Liquid: Not tested    Nectar  Thick Nectar Thick Liquid: Not tested   Honey Thick Honey Thick Liquid: Not tested   Puree Puree: Not tested   Solid   GO   Solid: Not tested       [name] MA, CCC-SLP Acute Care Speech Language Pathologist    Tristian Bouska E Sumney 06/30/2016,10:47 AM

## 2016-06-09 ENCOUNTER — Inpatient Hospital Stay (HOSPITAL_COMMUNITY): Payer: Medicare Other

## 2016-06-09 DIAGNOSIS — Z515 Encounter for palliative care: Secondary | ICD-10-CM

## 2016-06-09 DIAGNOSIS — N179 Acute kidney failure, unspecified: Secondary | ICD-10-CM

## 2016-06-09 DIAGNOSIS — J9602 Acute respiratory failure with hypercapnia: Secondary | ICD-10-CM

## 2016-06-09 DIAGNOSIS — R410 Disorientation, unspecified: Secondary | ICD-10-CM

## 2016-06-09 LAB — GLUCOSE, CAPILLARY
GLUCOSE-CAPILLARY: 103 mg/dL — AB (ref 65–99)
GLUCOSE-CAPILLARY: 100 mg/dL — AB (ref 65–99)
GLUCOSE-CAPILLARY: 100 mg/dL — AB (ref 65–99)
GLUCOSE-CAPILLARY: 118 mg/dL — AB (ref 65–99)
Glucose-Capillary: 95 mg/dL (ref 65–99)
Glucose-Capillary: 107 mg/dL — ABNORMAL HIGH (ref 65–99)

## 2016-06-09 LAB — CULTURE, BLOOD (ROUTINE X 2)
CULTURE: NO GROWTH
Culture: NO GROWTH
Special Requests: ADEQUATE
Special Requests: ADEQUATE

## 2016-06-09 LAB — BASIC METABOLIC PANEL
ANION GAP: 7 (ref 5–15)
BUN: 81 mg/dL — AB (ref 6–20)
CHLORIDE: 101 mmol/L (ref 101–111)
CO2: 26 mmol/L (ref 22–32)
Calcium: 8.1 mg/dL — ABNORMAL LOW (ref 8.9–10.3)
Creatinine, Ser: 3.84 mg/dL — ABNORMAL HIGH (ref 0.61–1.24)
GFR calc Af Amer: 17 mL/min — ABNORMAL LOW (ref >60)
GFR calc non Af Amer: 14 mL/min — ABNORMAL LOW (ref >60)
Glucose, Bld: 110 mg/dL — ABNORMAL HIGH (ref 65–99)
POTASSIUM: 4.3 mmol/L (ref 3.5–5.1)
SODIUM: 134 mmol/L — AB (ref 135–145)

## 2016-06-09 LAB — CBC
HCT: 24.1 % — ABNORMAL LOW (ref 39.0–52.0)
HEMOGLOBIN: 7.4 g/dL — AB (ref 13.0–17.0)
MCH: 33.9 pg (ref 26.0–34.0)
MCHC: 30.7 g/dL (ref 30.0–36.0)
MCV: 110.6 fL — ABNORMAL HIGH (ref 78.0–100.0)
Platelets: 77 10*3/uL — ABNORMAL LOW (ref 150–400)
RBC: 2.18 MIL/uL — AB (ref 4.22–5.81)
WBC: 5.7 10*3/uL (ref 4.0–10.5)

## 2016-06-09 MED ORDER — RENA-VITE PO TABS
1.0000 | ORAL_TABLET | Freq: Every day | ORAL | Status: DC
  Administered 2016-06-09 – 2016-06-19 (×11): 1 via ORAL
  Filled 2016-06-09 (×11): qty 1

## 2016-06-09 MED ORDER — LIDOCAINE-PRILOCAINE 2.5-2.5 % EX CREA: 1.0000 "application " | TOPICAL_CREAM | CUTANEOUS | Status: DC | PRN

## 2016-06-09 MED ORDER — SODIUM CHLORIDE 0.9 % IV SOLN
0.0000 ug/min | INTRAVENOUS | Status: DC
  Filled 2016-06-09: qty 1

## 2016-06-09 MED ORDER — IPRATROPIUM-ALBUTEROL 0.5-2.5 (3) MG/3ML IN SOLN: 3.0000 mL | Freq: Four times a day (QID) | RESPIRATORY_TRACT | Status: DC | PRN

## 2016-06-09 MED ORDER — LIDOCAINE HCL (PF) 1 % IJ SOLN: 5.0000 mL | INTRAMUSCULAR | Status: DC | PRN

## 2016-06-09 MED ORDER — HEPARIN SODIUM (PORCINE) 1000 UNIT/ML DIALYSIS
1000.0000 [IU] | INTRAMUSCULAR | Status: DC | PRN
  Filled 2016-06-09: qty 1

## 2016-06-09 MED ORDER — SODIUM CHLORIDE 0.9 % IV SOLN: 100.0000 mL | INTRAVENOUS | Status: DC | PRN

## 2016-06-09 MED ORDER — PENTAFLUOROPROP-TETRAFLUOROETH EX AERO: 1.0000 "application " | INHALATION_SPRAY | CUTANEOUS | Status: DC | PRN

## 2016-06-09 MED ORDER — ALTEPLASE 2 MG IJ SOLR: 2.0000 mg | Freq: Once | INTRAMUSCULAR | Status: DC | PRN

## 2016-06-09 MED ORDER — ANTICOAGULANT SODIUM CITRATE 4% (200MG/5ML) IV SOLN
5.0000 mL | Freq: Once | Status: AC
  Administered 2016-06-09: 5 mL via INTRAVENOUS
  Filled 2016-06-09 (×2): qty 250

## 2016-06-09 NOTE — Progress Notes (Addendum)
Pharmacy Antibiotic Note  Omar [name] is a 71 y.o. male s/p multiple rounds of abx for pneumonia, trach stomatitis and now re-broadening abx on 4/1 due to new VAP. Patient is being transitioned to [facility], but has not had any sessions due to hypotension. CRRT discontinued 4/4. Patient is afebrile with a stable wbc at 5.7. Urine output still at 0.1 ml/kg/hr.   Plan: -Meropenem 500 mg IV q24h -Stop date for 06/11/16 entered  -Monitor HD tolerance and s/sx's of toxicity given possible accumulation    Height:  (167.6 cm) Weight: (!) 340 lb (154.2 kg) (I feel this weight may be inaccurate) IBW/kg (Calculated) : 63.8  Temp (24hrs), Avg:98.7 F (37.1 C), Min:97.9 F (36.6 C), Max:99.3 F (37.4 C)   Recent Labs Lab 06/04/16 1350  06/05/16 0549  06/06/16 0420  06/06/16 1600 06/07/16 0435 06/07/16 1428 06/19/2016 0500 06/09/16 0423  WBC 5.0  < > 5.8  --  5.0  --   --  4.1  --  5.1 5.7  CREATININE  --   < > 3.96*  < >  --   < > 1.86* 1.45* 1.83* 2.71* 3.84*  LATICACIDVEN 1.3  --   --   --   --   --   --   --   --   --   --   VANCORANDOM  --   --   --   --  23  --   --   --   --   --   --   < > = values in this interval not displayed.  Estimated Creatinine Clearance: 24.6 mL/min (A) (by C-G formula based on SCr of 3.84 mg/dL (H)).    Allergies  Allergen Reactions  . Anacin Af [Acetaminophen]   . Heparin     Retroperitoneal bleed in 2018, previous bleed from heparin in 2013  . Niaspan [Niacin Er]     Antimicrobials this admission: Zosyn 3/6 >> 3/8 CTX 3/8 >> 3/14 Cefepime 3/23 >> 4/1 Vancomycin 3/23 >> 3/28, 4/1 > Meropenem 4/1 >  Dose adjustments this admission:  3/28 Vanc random = 37 4/2 Vanc random = 23  Microbiology results: 3/6 MRSA PCR >>negative 3/6 Blood >> negative 3/5 Urine >> negative 3/6 Resp >> H. Influenzae, beta lactamase positive 3/23 Resp >> Diptheroids 3/28 Resp >> NF 4/1 Blood >> ngtd 4/1 Trach >> negative 4/1 Urine >> negative  Allie Bossier, PharmD PGY1 Pharmacy Resident [phone] (Pager) 06/09/2016 8:03 AM

## 2016-06-09 NOTE — Consult Note (Signed)
Consultation Note Date: 06/09/2016   Patient Name: Omar [name]  DOB: [birth year]  MRN: [id number]  Age / Sex: 71 y.o., male  PCP: Chipper Herb, MD Referring Physician: Rigoberto Noel, MD  Reason for Consultation: Establishing goals of care and Psychosocial/spiritual support  HPI/Patient Profile: 71 y.o. male  with past medical history of Obesity (current weight 340 pounds; albumin 1.7), smile, atrial fib, gout, hypertension, multinodular goiter, osteoarthritis, hyperlipidemia, stroke, thrombocytopenia, vitamin D deficiency admitted from [facility] on  05/18/2016 . Critically ill after testing positive for the flu, pneumonia, with altered mental status. Patient developed a retroperitoneal bleed secondary to heparin. He also developed being in acute on chronic renal failure and has been on CRRT. He has undergone a trach and has been intermittently on ventilator support. He is not currently on the ventilator. He is alert shaking his head yes or no, answers appear to be relevant.   Clinical Assessment and Goals of Care: Patient is relatively alert. He held my hand. Shook his head no regarding pain or anxiety and worry. Patient having hemodialysis today versus  CRRT. Patient was going to go for placement of a PEG tube on 06/15/2016 but anesthesia felt that he was not a good anesthesia candidate and it was not placed. I have called his granddaughter who is healthcare power of attorney and we're planning to meet tomorrow at 4 PM. Her goals are clear to pursue all aggressive measures and she is still endorsing desire to be transferred to [facility]. She is aware that they were unable to place a PEG tube  Granddaughter, Omar [name] is HCPOA    SUMMARY OF RECOMMENDATIONS   Continue aggressive care Family still desiring transfer to [place] [facility] to meet with granddaughter 06/11/14 at [place]:  Full code    Symptom Management:   Pain: Patient denies pain. Continue with pain management as ordered by CCM  Anxiety: Patient handling trach collar and intermittent Vent support. Continue medical management of anxiety by CCM  Palliative Prophylaxis:   Aspiration, Bowel Regimen, Delirium Protocol, Eye Care, Frequent Pain Assessment, Oral Care and Turn Reposition  Additional Recommendations (Limitations, Scope, Preferences):  Full Scope Treatment  Psycho-social/Spiritual:   Desire for further Chaplaincy support:no  Additional Recommendations: ICU Family Guide  Prognosis:   Unable to determine  Discharge Planning: TBD. SNF vs LTAC      Primary Diagnoses: Present on Admission: . Acute respiratory failure ([place]) . Paroxysmal atrial fibrillation ([facility]) . Hypothyroidism   I have reviewed the medical record, interviewed the patient and family, and examined the patient. The following aspects are pertinent.  Past Medical History:  Diagnosis Date  . Asthma   . Atrial fibrillation ([place])   . Gout   . Hypertension   . Morbid obesity ([place])   . Multinodular goiter   . Osteoarthrosis and allied disorders   . Other and unspecified hyperlipidemia   . Stroke [facility])    right sided hemiparesis  . Thrombocytopenia ([place])   .  Vitamin D deficiency    Social History   Social History  . Marital status: Widowed    Spouse name: N/A  . Number of children: N/A  . Years of education: N/A   Social History Main Topics  . Smoking status: Former Research scientist (life sciences)  . Smokeless tobacco: Never Used     Comment: quit 12 + years ago   . Alcohol use No  . Drug use: No  . Sexual activity: No   Other Topics Concern  . None   Social History Narrative   Married, 1 child. Retired from job as Dealer at [employer].    Family History  Problem Relation Age of Onset  . Coronary artery disease Brother    Scheduled Meds: . chlorhexidine gluconate (MEDLINE KIT)  15 mL Mouth Rinse  BID  . Chlorhexidine Gluconate Cloth  6 each Topical Daily  . [START ON 06/12/2016] darbepoetin (ARANESP) injection - NON-DIALYSIS  200 mcg Subcutaneous Q Mon-1800  . feeding supplement (OSMOLITE 1.5 CAL)  1,000 mL Per Tube Q24H  . feeding supplement (PRO-STAT SUGAR FREE 64)  60 mL Per Tube TID  . insulin aspart  0-9 Units Subcutaneous Q4H  . ipratropium-albuterol  3 mL Nebulization Q6H  . levothyroxine  125 mcg Per Tube QAC breakfast  . mouth rinse  15 mL Mouth Rinse QID  . meropenem (MERREM) IV  500 mg Intravenous Q24H  . midodrine  10 mg Per Tube TID WC  . multivitamin  1 tablet Oral QHS  . pantoprazole sodium  40 mg Per Tube Daily   Continuous Infusions: . sodium chloride 10 mL/hr at 06/03/16 2000  . sodium chloride Stopped (06/06/16 0700)   PRN Meds:.Place/Maintain arterial line **AND** sodium chloride, albuterol, docusate, metoprolol, morphine injection, naLOXone (NARCAN)  injection, sodium chloride flush Medications Prior to Admission:  Prior to Admission medications   Medication Sig Start Date End Date Taking? Authorizing Provider  albuterol (PROVENTIL HFA;VENTOLIN HFA) 108 (90 Base) MCG/ACT inhaler Inhale 2 puffs into the lungs every 6 (six) hours as needed for wheezing or shortness of breath. 12/09/15  Yes Chipper Herb, MD  allopurinol (ZYLOPRIM) 300 MG tablet TAKE 1 TABLET DAILY 02/09/16  Yes Chipper Herb, MD  amLODipine (NORVASC) 5 MG tablet TAKE 1.5 TABLETS (7.5 MG TOTAL) BY MOUTH DAILY. 03/22/16  Yes Chipper Herb, MD  atorvastatin (LIPITOR) 20 MG tablet TAKE 1 TABLET (20 MG TOTAL) BY MOUTH DAILY AT 6 PM. 04/17/16  Yes Chipper Herb, MD  fluticasone [facility]) 50 MCG/ACT nasal spray Place 1 spray into both nostrils 2 (two) times daily as needed for allergies or rhinitis. 04/14/16  Yes Fransisca Kaufmann Dettinger, MD  furosemide (LASIX) 20 MG tablet Take 1 tablet (20 mg total) by mouth daily. 05/05/16  Yes Eustaquio Maize, MD  gabapentin (NEURONTIN) 300 MG capsule TAKE 1 CAPSULE AT 8 AM 1  AT 6 PM AND 2 AT BEDTIME 04/17/16  Yes Chipper Herb, MD  KLOR-CON M20 20 MEQ tablet TAKE 1 TABLET BY MOUTH EVERY DAY 04/17/16  Yes Chipper Herb, MD  levothyroxine (SYNTHROID, LEVOTHROID) 125 MCG tablet TAKE 1 TABLET EVERY DAY 04/24/16  Yes Chipper Herb, MD  losartan (COZAAR) 100 MG tablet TAKE 1 TABLET (100 MG TOTAL) BY MOUTH DAILY. 03/21/16  Yes Chipper Herb, MD  metoprolol (LOPRESSOR) 50 MG tablet TAKE 1/2 TABLET 2 TIMES A DAY 03/21/16  Yes Chipper Herb, MD  Vitamin D, Ergocalciferol, (DRISDOL) 50000 units CAPS capsule Take 1 capsule (50,000  Units total) by mouth every 7 (seven) days. 03/07/16  Yes Chipper Herb, MD  warfarin (COUMADIN) 7.5 MG tablet TAKE 1 TABLET DAILY AT [facility] 04/17/16  Yes Chipper Herb, MD   Allergies  Allergen Reactions  . Anacin Af [Acetaminophen]   . Heparin     Retroperitoneal bleed in 2018, previous bleed from heparin in 2013  . Niaspan [Niacin Er]    Review of Systems  Unable to perform ROS: Intubated    Physical Exam  Constitutional:  Obese older man . In ICU, fairly alert. Critically ill Weight 340 lbs  HENT:  Head: Normocephalic and atraumatic.  Neck:  trach  Cardiovascular:  tachy  Pulmonary/Chest:  On trach collar. Not currently on vent Moist secretions noted  Genitourinary:  Genitourinary Comments: foley  Musculoskeletal: Normal range of motion.  Neurological: He is alert.  Skin: Skin is warm and dry.  Psychiatric:  Unable to test  Nursing note and vitals reviewed.   Vital Signs: BP (!) 86/53   Pulse (!) 104   Temp 97.9 F (36.6 C) (Oral)   Resp (!) 27   Ht '5\' 6"'  (1.676 m)   Wt (!) 154.2 kg (340 lb) Comment: I feel this weight may be inaccurate  SpO2 93%   BMI 54.88 kg/m  Pain Assessment: CPOT   Pain Score: 0-No pain   SpO2: SpO2: 93 % O2 Device:SpO2: 93 % O2 Flow Rate: .O2 Flow Rate (L/min): 6 L/min  IO: Intake/output summary:  Intake/Output Summary (Last 24 hours) at 06/09/16 1041 Last data filed at 06/09/16 1000   Gross per 24 hour  Intake              790 ml  Output              602 ml  Net              188 ml    LBM: Last BM Date: 06/09/16 Baseline Weight: Weight: (!) 170.1 kg (375 lb) Most recent weight: Weight: (!) 154.2 kg (340 lb) (I feel this weight may be inaccurate)     Palliative Assessment/Data:   Flowsheet Rows     Most Recent Value  Intake Tab  Referral Department  Critical care  Unit at Time of Referral  ICU  Palliative Care Primary Diagnosis  Pulmonary  Date Notified  06/09/16  Reason for referral  Clarify Goals of Care  Date of Admission  07/02/2016  Date first seen by Palliative Care  06/09/16  # of days Palliative referral response time  0 Day(s)  # of days IP prior to Palliative referral  1  Clinical Assessment  Palliative Performance Scale Score  20%  Pain Max last 24 hours  Not able to report  Pain Min Last 24 hours  Not able to report  Dyspnea Max Last 24 Hours  Not able to report  Dyspnea Min Last 24 hours  Not able to report  Nausea Max Last 24 Hours  Not able to report  Nausea Min Last 24 Hours  Not able to report  Anxiety Max Last 24 Hours  Not able to report  Anxiety Min Last 24 Hours  Not able to report  Other Max Last 24 Hours  Not able to report  Psychosocial & Spiritual Assessment  Palliative Care Outcomes  Patient/Family meeting held?  No  Palliative Care follow-up planned  Yes, Facility      Time In: 0900 Time Out: 1010 Time Total: 70in Greater than 50%  of this time was spent counseling and coordinating care related to the above assessment and plan.  Signed by: Dory Horn, NP   Please contact Palliative Medicine Team phone at [phone] for questions and concerns.  For individual provider: See Shea Evans

## 2016-06-09 NOTE — Progress Notes (Signed)
qPhysical Therapy Treatment Patient Details Name: Omar [name] MRN: [id number] DOB: Oct 18, 1945 Today's Date: 06/09/2016    History of Present Illness 71 yo admitted with respiratory failure, AMS, aspiration, sepsis, retroperitoneal bleed. ETT3/5-3/8, 3/10-3/21, trach 3/21, 3/27 pt found to have proximal humerus fx without precipitating event. PMHx: CVA with Right hemiplegia, Asthma, Afib, gout, HTN, obesity, OA    PT Comments    Pt lethargic today with limited interaction and decreased ability to follow commands or participate today. Pt unable to assist with grasping rail, reaching for therapist hand or even initiating movement today. With pt body habitus and lack of assist unable to attempt transfers today and performed bil LE HEP as well as LUE HEP. Pt with very rigid Left knee today and only able to gain grossly 10 degrees of motion in Left knee today. Pt reports no pain but would grimace with touch to RUE. Pt not progressing at this time, will continue to follow.   BP 93/51   Follow Up Recommendations  LTACH;Supervision/Assistance - 24 hour     Equipment Recommendations  None recommended by PT    Recommendations for Other Services       Precautions / Restrictions Precautions Precautions: Fall Precaution Comments: panda, trach, flexiseal, right hemiplegia Required Braces or Orthoses: Sling Restrictions Weight Bearing Restrictions: Yes RUE Weight Bearing: Non weight bearing    Mobility  Bed Mobility               General bed mobility comments: unable to attempt bed mobility today as pt not assisting with any movement and not following commands to reach for rails or initiate transfers  Transfers                 General transfer comment: unable to attempt at this time  Ambulation/Gait                 Stairs            Wheelchair Mobility    Modified Rankin (Stroke Patients Only)       Balance                                            Cognition Arousal/Alertness: Awake/alert Behavior During Therapy: Flat affect Overall Cognitive Status: Impaired/Different from baseline Area of Impairment: Attention;Following commands                   Current Attention Level: Focused   Following Commands: Follows one step commands inconsistently       General Comments: pt not mouthing answers today, difficulty maintaining attention and arousal, pt not following commands for movement or HEP today      Exercises General Exercises - Upper Extremity Shoulder Flexion: AAROM;Left;Supine;10 reps General Exercises - Lower Extremity Heel Slides: PROM;Right;10 reps;Supine (unable to move Left knee at all to attempt ROM) Hip ABduction/ADduction: PROM;Both;Supine;10 reps Other Exercises Other Exercises: left hip internal and external rotation, PROM x 10 reps, supine    General Comments        Pertinent Vitals/Pain Pain Assessment: No/denies pain Faces Pain Scale: No hurt    Home Living                      Prior Function            PT Goals (current goals can now be found in the care  plan section) Progress towards PT goals: Not progressing toward goals - comment;Goals downgraded-see care plan (pt limited by cognition)    Frequency           PT Plan Current plan remains appropriate    Co-evaluation             End of Session Equipment Utilized During Treatment: Oxygen Activity Tolerance: Patient limited by lethargy Patient left: in bed;with call bell/phone within reach;with nursing/sitter in room Nurse Communication: Mobility status;Need for lift equipment PT Visit Diagnosis: Muscle weakness (generalized) (M62.81);Hemiplegia and hemiparesis;Other abnormalities of gait and mobility (R26.89) Hemiplegia - Right/Left: Right Hemiplegia - dominant/non-dominant: Non-dominant Hemiplegia - caused by: Other cerebrovascular disease     Time: 1610-9604 PT Time Calculation (min)  (ACUTE ONLY): 23 min  Charges:  $Therapeutic Exercise: 8-22 mins $Therapeutic Activity: 8-22 mins                    G Codes:       Delaney Meigs, PT [phone]    Niurka Benecke B Amedio Bowlby 06/09/2016, 10:31 AM

## 2016-06-09 NOTE — Progress Notes (Addendum)
PCCM Progress Note  Admission date: 05/27/2016 Referring provider: Dr. Clarene Duke, [facility]  CC: altered mental status  HPI: 71 y/o male from [facility] admitted 3/5 with altered mental status + H.flu PNA with aspiration, sepsis, hypoxic/hypercapnic respiratory failure. Course c/b retroperitoneal bleed while on heparin with subsequent shock, acute renal failure & prolonged ventilation requiring tracheostomy. On CRRT intermittently through hosp and has not tolerated HD due to hypotension. Palliative care has been consulted for goals of care discussion as pt has unreasonable expectations for recovery.    has a past medical history of Asthma; Atrial fibrillation (HCC); Gout; Hypertension; Morbid obesity (HCC); Multinodular goiter; Osteoarthrosis and allied disorders; Other and unspecified hyperlipidemia; Stroke [facility]); Thrombocytopenia (HCC); and Vitamin D deficiency.  Antibiotics: Zosyn 3/06 >> 3/08 Rocephin (H.Flu PNA) 3/08 >> 3/14 Vancomycin 3/23 >> 3/28 Cefepime 3/23 >> 4/1 Meropenem 4/1 >> Vancomycin 4/3 >> 4/4  Cultures: Urine 3/05 >> negative Blood 3/06 >> negative Resp 3/6 >> H flu (b-lactamase +) Trach stoma site 3/23 >> diphtheroids Resp cx 3/28 >>nl flora Blood 4/1 NGTD  Lines/tubes: ETT 3/05 >> 3/8 ETT 3/10 >> 3/21 Trach (Dr Jenne Pane) 3/21 >>  Lt IJ CVL 3/07 >> 3/27 R HD catheter  >> 3/22 R IJ HD catheter 3/23 >>   Events: 3/05  Transfer to [facility] Sputum 3/06 >> Haemophilus influenzae (beta lactamase positive) Echo 3/06 >> EF 65 to 70%, mod LVH, mod TR, PAS 39 mmHg CT head 3/7 >> no acute abnormality  3/10 - Large retroperitoneal bleed noted on CT after c/o back pain/leg pain 3/27 rt humerus fracture identified - ortho consult 3/27 RUQ [place] .Marland Kitchen Not helpful due to body habitus. On ATC with copious secrtions.  3/29 - Chronic  critically ill 3/27 - end CRRT 3/30 - doing ATC x 24-48h per RN. Rt shoulder pain only when touched per RN. Making urine. Off CRRT. Not on pressors. Family not  fully on board about LTAC per RN but looking at [facility]. Needs LTAC.  4/2 - resumed CRRT 4/3 - Family agreeable to PEG tube 4/5 - G-tube to be placed by gen surg however anesthesia refused 2/2 hypotension.   SUBJECTIVE/OVERNIGHT/INTERVAL HX Consulted palliative care 4/6 for GOC discussion. Family still adamant about transfer to [facility].   Vital signs: BP 99/67   Pulse (!) 112   Temp 98.7 F (37.1 C) (Oral)   Resp 20   Ht  (1.676 m)   Wt (!) 340 lb (154.2 kg) Comment: I feel this weight may be inaccurate  SpO2 95%   BMI 54.88 kg/m   Intake/output:  Intake/Output Summary (Last 24 hours) at 06/09/16 0709 Last data filed at 06/09/16 0600  Gross per 24 hour  Intake              630 ml  Output              620 ml  Net               10 ml   I/O last 3 completed shifts: In: 905 [NG/GT:855; IV Piggyback:50] Out: 635 [Urine:185; Other:200; Stool:250]  EXAM General: [name]. Obese HENT: [place]/AT. No obv abnormality. EOMI. +TRACH, +NG Lungs: Coarse breath sounds throughout Chest wall: not tender to palp Heart: IRIR. Holosystolic murmur 2/6.  Abdomen: soft obese abdomen, non tender, not distended. +bs Extremities: 3+ edema BL Neuro: Alert and interactive.   LABS  PULMONARY  Recent Labs Lab 06/04/16 1550  PHART 7.387  PCO2ART 46.5  PO2ART 89.2  HCO3 26.9  O2SAT 96.5    CBC  Recent Labs Lab 06/07/16 0435 06/15/2016 0500 06/09/16 0423  HGB 7.7* 7.3* 7.4*  HCT 25.0* 23.3* 24.1*  WBC 4.1 5.1 5.7  PLT 45* 52* 77*    COAGULATION  Recent Labs Lab 06/18/2016 0550  INR 1.33    CARDIAC  No results for input(s): TROPONINI in the last 168 hours. No results for input(s): PROBNP in the last 168 hours.   CHEMISTRY  Recent Labs Lab 06/04/16 0442  06/05/16 0549  06/06/16 0500 06/06/16 1600 06/07/16 0435 06/07/16 1428 06/07/2016 0500 06/09/16 0423  NA 135  < > 134*  < > 134* 135 136 135 134* 134*  K 3.6  < > 4.5  < > 4.1 4.2 4.0 4.3 4.2 4.3   CL 97*  < > 98*  < > 101 104 103 103 102 101  CO2 29  < > 25  < > GLUCOSE 126*  < > 125*  < > 112* 116* 114* 104* 91 110*  BUN 96*  < > 131*  < > 64* 46* 36* 41* 59* 81*  CREATININE 2.77*  < > 3.96*  < > 2.13* 1.86* 1.45* 1.83* 2.71* 3.84*  CALCIUM 7.8*  < > 8.3*  < > 8.1* 8.0* 7.9* 7.8* 8.0* 8.1*  MG 1.8  --  2.0  --  2.2  --  2.1  --  2.2  --   PHOS 3.4  --  5.0*  < > 3.3 2.9 2.8 2.7 3.4  --   < > = values in this interval not displayed. Estimated Creatinine Clearance: 24.6 mL/min (A) (by C-G formula based on SCr of 3.84 mg/dL (H)).  LIVER  Recent Labs Lab 06/06/16 0500 06/06/16 1600 06/07/16 0435 06/07/16 1428 06/14/2016 0500 06/04/2016 0550  ALBUMIN 2.0* 1.9* 1.8* 1.7* 1.7*  --   INR  --   --   --   --   --  1.33   INFECTIOUS  Recent Labs Lab 06/04/16 1350 06/05/16 0549 06/06/16 0500  LATICACIDVEN 1.3  --   --   PROCALCITON 6.13 6.68 4.22    ENDOCRINE CBG (last 3)   Recent Labs  06/22/2016 1951 06/12/2016 2333 06/09/16 0346  GLUCAP 104* 105* 95    IMAGING x48h  - image(s) personally visualized  -   highlighted in bold No results found. DISCUSSION: 71 year old man, history of fibrillation, obesity, admitted with respiratory failure and septic shock in the setting of H flu pneumonia. Subsequent course complicated by retroperitoneal bleeding on anticoagulation, hemorrhagic shock, acute respiratory failure and acute renal failure. s/p trach. Currently back on vent/trach and CRRT vs HD. Unable to tolerate placement of PEG or G-tube 2/2 anatomy and hypotension. Unfortunately has rt humerus fracture -conservative mx  ASSESSMENT / PLAN:  PULMONARY A:Acute respiratory failure with chronoic resp failure - ATC since 05/30/16 Pneumonia, H influenza (beta lactamase positive) Presumed OSA/OHS History of asthma Bilateral pulm infiltrates / effusions, consistent w volume overload 06/04/16 Back on vent due to sepsis syndrome, likely HCAP Attempting transfer to  [facility], but no ICU beds avail.   P:  Vent/trach as tol Cont IV Meropenem, stop date 4/8  CARDIOVASCULAR A:  Shock, principally hemorrhagic however perhaps component of septic Paroxysmal atrial fibrillation History of hypertension 3/31-off pressors 06/04/16 - hypotension, back on pressors 4/2 cortisol 42.6 4/5 midodrine added  PLAN Off stress dose steroids, continue to hold anti-hypertensives. Req pressors 4/1, currently off but may require  re initiation  RENAL I/O last 3 completed shifts: In: 905 [NG/GT:855; IV Piggyback:50] Out: 635 [Urine:185; Other:200; Stool:250] Lab Results  Component Value Date   CREATININE 3.84 (H) 06/09/2016   CREATININE 2.71 (H) 07/01/2016   CREATININE 1.83 (H) 06/07/2016   CREATININE 0.80 07/16/2012   A:  Acute renal failure, presumed due to ATN in the setting of hemorrhagic shock. Pt intermittently on CRRT and unable to tolerate HD 2/2 hypotension.  3/30 - creat rising but making urine 3/31 - needs HD  06/04/16 - look dry 4/3 - volume up --> CRRT 4/5 - Started midodrine  4/6 Will try HD again  P: Pt not responding to lasix and still remains oliguric. Nephro will try again to initiate HD however was unable to proceed earlier this week 2/2 hypotension. May need CRRT again but pt cannot remain on CRRT indefinitely. Palliative care on board to assist with difficult family situation.  Midodrine 10 mg TID started yest per nephro for BP support with minimal improvement.   GASTROINTESTINAL A:  Large retroperitoneal bleed noted 3/10, some slight increased 3/18 CT scan, hemoglobin currently stable at 7.4.  Shock liver improving.  Hepatic lesion; noted on CT scan of the abdomen 3/18. 5 cm lesion of the dome of the liver, could represent blood or infection or infarct.  Protein calorie malnutrition Family OK PEG tube however IR unable to place 2/2 complicated anatomy. Gen surg unable to place yesterday 2/2 hypotension.  P:   Continue PPI Continue tube  feeds PEG/G tube unable to be placed  HEMATOLOGIC  Recent Labs Lab 06/07/16 0435 2016-07-01 0500 06/09/16 0423  HGB 7.7* 7.3* 7.4*  HCT 25.0* 23.3* 24.1*  WBC 4.1 5.1 5.7  PLT 45* 52* 77*   A:  Acute blood loss anemia, improved Severe heparin sensitivity, associated with hemorrhage Thrombocytopenia, acute on chronic -came in low at 140k Aranesp 4/2  P:  Follow CBC, Goal hemoglobin greater than 7.0. Currently 7.4 No heparin ever Hold anticoagulation  SCD for DVT prophylaxis Plts 77,000 today  INFECTIOUS A:  H. influenzae pneumonia, treated HCAP Septic shock ? Clinical significance hepatic lesion 3/30 - on abx. Not on pressors 06/04/16 - likely HCAP, started back on pressors  4/3 off pressors but is getting supp with midodrine currently  P: Antibiotics for initial pneumonia were completed Cefepime, started 3/23, changed to Merrem 06/04/16 -->  No leukocytosis Pan culture: Tracheal asp 4/1 with NF  ENDOCRINE CBG (last 3)   Recent Labs  2016-07-01 1951 July 01, 2016 2333 06/09/16 0346  GLUCAP 104* 105* 95   A:   Hyperglycemia, on steroids Hypothyroidism P:  Continue SSI, CBG Continue Synthroid  NEUROLOGIC A: Toxic metabolic encephalopathy, mental status ok History of CVA with residual deficits Rt humerus fracture  3/31 - follows comands 4/1 - more tired due to sepsis  P:  PT consult - LTAC recommended, family unsure.  RUE sling Morphine prn pain   MSK A Rt shoulder fracture during hospital stay P: sling + conserv management  FAMILY DISCUSSIONS: Family OK with PEG tube as of 4/3 but unfortunately pt could not tolerate anesthesia. Family hostile, see prior notes. LTAC recommended, grand[facility]. Granddaughter HCPOA  Inter-disciplinary family meet or Palliative Care meeting due by: done  CODE STATUS: Full Code  DISPO Keep in ICU given above issues. Fam wants transfer to [facility] and has been instructed to arrange. Fam  continues to push [place] for transfer, have unreasonable expectations and believe "we are  trying to kill her father"  The patient is critically ill with multiple organ systems failure and requires high complexity decision making for assessment and support, frequent evaluation and titration of therapies, application of advanced monitoring technologies and extensive interpretation of multiple databases.   Bethany Molt, D.O. Internal Medicine Resident PGY1 Pager [phone]   ATTENDING NOTE: I have personally reviewed patient's available data, including medical history, events of note, physical examination and test results as part of my evaluation. I have discussed with resident/NP and other careteam providers such as pharmacist, RN and RRT & co-ordinated with consultants. In addition, I personally evaluated patient and elicited key history of   71 year old man, baseline wheelchair bound due to CVA with atrial fibrillation admitted 3/5 and a long complicated course due to retroperitoneal bleed and shock with renal failure requiring dialysis and prolonged ventilation requiring tracheostomy. IR unable to place PEG and surgical gastrostomy not done/5 due to hypotension  On exam- morbidly obese, in no distress, decreased secretions from tracheostomy, denies pain, decreased breath sounds bilateral, S1-S2 tachycardia, mildly hypotensive, soft and nontender abdomen.  Labs -mild hyponatremia, rising creatinine to 3.8, no leukocytosis, hemoglobin low but stable at 7.4 and low platelets.  Impression/plan Hypotension -no clear etiology, doubt new sepsis, will add midodrine  AKI - CRRT had to be reinitiated and now intermittent dialysis to be tried today if his blood pressure tolerates. Unfortunately he has not had renal recovery like we hoped  Acute on chronic respiratory failure/prolonged ventilation/tracheostomy- will need downsize to #6 cuffed class next week if his secretions remain decreased, hopefully this  will enable  better swallowing Otherwise plan for surgical gastrostomy again once hypotension improved  Based on family request, we have requested bed at [facility]   Rest per NP/medical resident whose note is outlined above and that I agree with and edited in full.   The patient is critically ill with multiple organ systems failure and requires high complexity decision making for assessment and support, frequent evaluation and titration of therapies, application of advanced monitoring technologies and extensive interpretation of multiple databases. Critical Care Time devoted to patient care services described in this note independent of resident time is 32 minutes.   Oretha Milch MD

## 2016-06-09 NOTE — Progress Notes (Signed)
Was asked to not change trach during dialysis

## 2016-06-09 NOTE — Progress Notes (Signed)
  Speech Language Pathology Treatment: [name] Speaking valve  Patient Details Name: Omar [name] MRN: [id number] DOB: Mar 16, 1945 Today's Date: 06/09/2016 Time: 4098-1191 SLP Time Calculation (min) (ACUTE ONLY): 8 min  Assessment / Plan / Recommendation Clinical Impression  SLP followed up for PMSV trials. Patient with increased lethargy today, arousing with verbal and tactile stimulation but quickly falling back to sleep.  PMV placed and patient able to maintain stable HR, RR, and O2. No attempts to communicate verbally today despite max SLP cueing for phonatory output. Clear exchange of air through upper airway present however. Subtle indication of possible air trapping vs restoration of PEEP noted with positive puff off air with valve removal.  Valve removed after session, cuff left deflated. Consulted with MD (Dr. Vassie Loll) for possible trach downsize with hopes of improved airway patency for phonation and swallowing. MD to address with potential downsize early next week as long as secretions remain low. Unable to complete FEES this date given lethargy. SLP will f/u 4/9.    HPI HPI: 71 y/o male from [facility] admitted 3/5 with altered mental status with aspiration, sepsis, hypoxic/hypercapnic respiratory failure. Course c/b retroperitoneal bleed, shock, acute renal failure & prolonged ventilation requiring tracheostomy.  ETT 3/05-3/08; 3/10 - trach 3/21. CRRT.      SLP Plan  Continue with current plan of care       Recommendations         Patient may use Passy-Muir Speech Valve: with SLP only PMSV Supervision: Full MD: Please consider changing trach tube to : Smaller size;Cuffless         Oral Care Recommendations: Oral care QID Follow up Recommendations: LTACH SLP Visit Diagnosis: Aphonia (R49.1) Plan: Continue with current plan of care       GO              Ferdinand Lango MA, CCC-SLP [phone]   Matthieu Loftus Meryl 06/09/2016, 9:29 AM

## 2016-06-09 NOTE — Procedures (Signed)
I was present at this session.  I have reviewed the session itself and made appropriate changes.  HD via temp cath.  bp marginal.  Use NE if needed  Omar [name] L 4/6/20182:13 PM

## 2016-06-09 NOTE — Care Management Note (Addendum)
Case Management Note Previous CM note initiated by Donn Pierini 05/30/16 Previous CM note initiated by Cherylann Parr, RN 06/03/2016, 2:22 PM   Patient Details  Name: Omar [name] MRN: [id number] Date of Birth: 1945/03/08  Subjective/Objective: Pt admitted for acute resp failure - on ventilator                   Action/Plan:  PTA from home with daughter and grandkids.  Pt is wheelchair bound at home.  CM will continue to follow for discharge needs   Expected Discharge Date:                  Expected Discharge Plan:  Long Term Acute Care (LTAC)  In-House Referral:  Clinical Social Work  Discharge planning Services  CM Consult  Post Acute Care Choice:    Choice offered to:     DME Arranged:    DME Agency:     HH Arranged:    HH Agency:     Status of Service:  In process, will continue to follow  If discussed at Long Length of Stay Meetings, dates discussed:  3/27  Additional Comments: 06/09/2016 11:46 Pt now off CRRT - attempting IHD today (schedule not established).  Pt was unable to have G tube placed.  PC meeting with family tomorrow to discuss GOC.  Family are interested in [facility] based on location at discharge however were made aware that it will depend on bed availability - both agencies continue to follow pt - barrier remains HD plan  05/31/16 CM spoke with POA and daughter regarding recommendation of LTACH discharge.  CM provided choice of facilities - family will discuss recommendation of discharge plan.  Both facilites will offer bed pending bed availability - also the lack of a definite HD plan is a barrier to discharge  05/30/16 Raynald Blend, RN, BSN [phone] [facility] referral agreed to by attending.  LTACH referral given to both agencies - CM awaiting response.  Pt is now off CRRT and renal will assess daily for IHD.    05/30/16- 0830- Donn Pierini RN, CM- discussed case with DR. A- ok for referral to both LTACHs- s/p trach- pt weaning vent- on TC  during day, restarted CVVHD on 3/24, continued TF, IV lasix, weaning Levophed. Reported off to unit CM- Raynald Blend who will f/u on [facility] referrals.  Remains appropriate for continued stay.    Cherylann Parr, RN 05/22/2016, 2:22 PM--Pt going for trach today.  Pt will have 24 hour CRRT holiday starting today and will resume tomorrow  05/23/16 Discussed in LOS 05/23/16 - remains appropriate for continued stay   05/22/16  Pt remains ventilated - not weaning well, remains on CRRT.  Plan is for possible trach this week  05/18/16 Discussed in LOS 05/18/16 - remains appropriate for continued stay.  Remains on ventilator   Cherylann Parr, RN 06/09/2016, 2:23 PM [phone]

## 2016-06-09 NOTE — Progress Notes (Signed)
Subjective:  Interval History: has no complaint , not real cooperative/coherent.  Objective: Vital signs in last 24 hours: Temp:  [98.2 F (36.8 C)-99.3 F (37.4 C)] 98.7 F (37.1 C) (04/06 0344) Pulse Rate:  [98-115] 110 (04/06 0600) Resp:  [10-29] 20 (04/06 0600) BP: (71-116)/(35-67) 99/67 (04/06 0600) SpO2:  [91 %-99 %] 92 % (04/06 0600) FiO2 (%):  [28 %] 28 % (04/06 0329) Weight change:   Intake/Output from previous day: 04/05 0701 - 04/06 0700 In: 630 [NG/GT:580; IV Piggyback:50] Out: 620 [Urine:170; Stool:250] Intake/Output this shift: Total I/O In: 440 [NG/GT:440] Out: 300 [Other:200; Stool:100]  General appearance: morbidly obese, syndromic appearance - ^RR, not as coherent and massive obesity Neck: trach, IJ cath Resp: diminished breath sounds bilaterally, rales bilaterally and rhonchi bilaterally Cardio: irregularly irregular rhythm, S1, S2 normal and systolic murmur: holosystolic 2/6, blowing at apex GI: massive, pos bs,soft Extremities: edema 3+ and sling on R arm Skin: multiple skin lesions, dressings  Lab Results:  Recent Labs  06/19/2016 0500 06/09/16 0423  WBC 5.1 5.7  HGB 7.3* 7.4*  HCT 23.3* 24.1*  PLT 52* 77*   BMET:  Recent Labs  06/25/2016 0500 06/09/16 0423  NA 134* 134*  K 4.2 4.3  CL 102 101  CO2 26 26  GLUCOSE 91 110*  BUN 59* 81*  CREATININE 2.71* 3.84*  CALCIUM 8.0* 8.1*   No results for input(s): PTH in the last 72 hours. Iron Studies: No results for input(s): IRON, TIBC, TRANSFERRIN, FERRITIN in the last 72 hours.  Studies/Results: No results found.  I have reviewed the patient's current medications.  Assessment/Plan: 1 AKI oliguric,low bp. Will try IHD.  bp may limit.  7L over lowest which was still wet..  Acid/base/K ok. Solute rising 2 Chronic resp failure trach.  More secretions 3 asp on Merum 4 Arm fx 5 Afib 6 low bp could not get PEG 7 Nutrition 8 Massive obesity 9 anemia P HD, esa, Merum TF, mido   LOS: 32  days   Iriel Nason L 06/09/2016,6:59 AM

## 2016-06-10 DIAGNOSIS — Z515 Encounter for palliative care: Secondary | ICD-10-CM

## 2016-06-10 DIAGNOSIS — J189 Pneumonia, unspecified organism: Secondary | ICD-10-CM

## 2016-06-10 DIAGNOSIS — Z992 Dependence on renal dialysis: Secondary | ICD-10-CM

## 2016-06-10 DIAGNOSIS — N186 End stage renal disease: Secondary | ICD-10-CM

## 2016-06-10 LAB — RENAL FUNCTION PANEL
ALBUMIN: 1.7 g/dL — AB (ref 3.5–5.0)
ANION GAP: 9 (ref 5–15)
BUN: 63 mg/dL — ABNORMAL HIGH (ref 6–20)
CALCIUM: 8 mg/dL — AB (ref 8.9–10.3)
CO2: 27 mmol/L (ref 22–32)
Chloride: 98 mmol/L — ABNORMAL LOW (ref 101–111)
Creatinine, Ser: 3.09 mg/dL — ABNORMAL HIGH (ref 0.61–1.24)
GFR, EST AFRICAN AMERICAN: 22 mL/min — AB (ref >60)
GFR, EST NON AFRICAN AMERICAN: 19 mL/min — AB (ref >60)
Glucose, Bld: 109 mg/dL — ABNORMAL HIGH (ref 65–99)
PHOSPHORUS: 5.6 mg/dL — AB (ref 2.5–4.6)
Potassium: 3.6 mmol/L (ref 3.5–5.1)
SODIUM: 134 mmol/L — AB (ref 135–145)

## 2016-06-10 LAB — GLUCOSE, CAPILLARY
GLUCOSE-CAPILLARY: 104 mg/dL — AB (ref 65–99)
GLUCOSE-CAPILLARY: 105 mg/dL — AB (ref 65–99)
GLUCOSE-CAPILLARY: 106 mg/dL — AB (ref 65–99)
GLUCOSE-CAPILLARY: 110 mg/dL — AB (ref 65–99)
Glucose-Capillary: 83 mg/dL (ref 65–99)
Glucose-Capillary: 106 mg/dL — ABNORMAL HIGH (ref 65–99)

## 2016-06-10 LAB — CBC
HCT: 24.6 % — ABNORMAL LOW (ref 39.0–52.0)
HEMOGLOBIN: 7.6 g/dL — AB (ref 13.0–17.0)
MCH: 33.9 pg (ref 26.0–34.0)
MCHC: 30.9 g/dL (ref 30.0–36.0)
MCV: 109.8 fL — ABNORMAL HIGH (ref 78.0–100.0)
PLATELETS: 67 10*3/uL — AB (ref 150–400)
RBC: 2.24 MIL/uL — AB (ref 4.22–5.81)
WBC: 5.1 10*3/uL (ref 4.0–10.5)

## 2016-06-10 LAB — HEPATITIS B SURFACE ANTIGEN: HEP B S AG: NEGATIVE

## 2016-06-10 LAB — HEPATITIS B CORE ANTIBODY, TOTAL: HEP B C TOTAL AB: NEGATIVE

## 2016-06-10 LAB — HEPATITIS B SURFACE ANTIBODY,QUALITATIVE: Hep B S Ab: NONREACTIVE

## 2016-06-10 MED ORDER — LIDOCAINE-PRILOCAINE 2.5-2.5 % EX CREA: 1.0000 "application " | TOPICAL_CREAM | CUTANEOUS | Status: DC | PRN

## 2016-06-10 MED ORDER — LIDOCAINE HCL (PF) 1 % IJ SOLN: 5.0000 mL | INTRAMUSCULAR | Status: DC | PRN

## 2016-06-10 MED ORDER — PENTAFLUOROPROP-TETRAFLUOROETH EX AERO: 1.0000 "application " | INHALATION_SPRAY | CUTANEOUS | Status: DC | PRN

## 2016-06-10 MED ORDER — HEPARIN SODIUM (PORCINE) 1000 UNIT/ML DIALYSIS: 1000.0000 [IU] | INTRAMUSCULAR | Status: DC | PRN

## 2016-06-10 MED ORDER — SODIUM CHLORIDE 0.9 % IV SOLN: 100.0000 mL | INTRAVENOUS | Status: DC | PRN

## 2016-06-10 MED ORDER — IPRATROPIUM-ALBUTEROL 0.5-2.5 (3) MG/3ML IN SOLN
3.0000 mL | Freq: Four times a day (QID) | RESPIRATORY_TRACT | Status: DC
  Administered 2016-06-10 – 2016-06-12 (×8): 3 mL via RESPIRATORY_TRACT
  Filled 2016-06-10 (×9): qty 3

## 2016-06-10 MED ORDER — ALTEPLASE 2 MG IJ SOLR: 2.0000 mg | Freq: Once | INTRAMUSCULAR | Status: DC | PRN

## 2016-06-10 MED ORDER — BUDESONIDE 0.5 MG/2ML IN SUSP
0.5000 mg | Freq: Two times a day (BID) | RESPIRATORY_TRACT | Status: DC
  Administered 2016-06-10 – 2016-06-20 (×19): 0.5 mg via RESPIRATORY_TRACT
  Filled 2016-06-10 (×22): qty 2

## 2016-06-10 NOTE — Progress Notes (Signed)
RT replaced patient's ATC without complications. Vitals stable at this time. Patient is tolerating well. RT will continue to monitor.

## 2016-06-10 NOTE — Progress Notes (Signed)
Daily Progress Note   Patient Name: Omar [name]       Date: 06/10/2016 DOB: Jan 29, 1946  Age: 71 y.o. MRN#: [id number] Attending Physician: Rigoberto Noel, MD Primary Care Physician: Redge Gainer, MD Admit Date: 05/29/2016  Reason for Consultation/Follow-up: Establishing goals of care and Psychosocial/spiritual support  Subjective: Patient has been off the ventilator for now greater than 48 hours; no use of pressors. Tolerated hemodialysis. He is alert and interacting appropriately. No agitation  Length of Stay: 33  Current Medications: Scheduled Meds:  . chlorhexidine gluconate (MEDLINE KIT)  15 mL Mouth Rinse BID  . Chlorhexidine Gluconate Cloth  6 each Topical Daily  . [START ON 06/12/2016] darbepoetin (ARANESP) injection - NON-DIALYSIS  200 mcg Subcutaneous Q Mon-1800  . feeding supplement (OSMOLITE 1.5 CAL)  1,000 mL Per Tube Q24H  . feeding supplement (PRO-STAT SUGAR FREE 64)  60 mL Per Tube TID  . insulin aspart  0-9 Units Subcutaneous Q4H  . levothyroxine  125 mcg Per Tube QAC breakfast  . mouth rinse  15 mL Mouth Rinse QID  . meropenem (MERREM) IV  500 mg Intravenous Q24H  . midodrine  10 mg Per Tube TID WC  . multivitamin  1 tablet Oral QHS  . pantoprazole sodium  40 mg Per Tube Daily    Continuous Infusions: . sodium chloride 10 mL/hr at 06/03/16 2000  . sodium chloride Stopped (06/06/16 0700)  . phenylephrine (NEO-SYNEPHRINE) Adult infusion Stopped (06/09/16 1400)    PRN Meds: Place/Maintain arterial line **AND** sodium chloride, sodium chloride, sodium chloride, albuterol, alteplase, docusate, heparin, ipratropium-albuterol, lidocaine (PF), lidocaine-prilocaine, metoprolol, morphine injection, naLOXone (NARCAN)  injection, pentafluoroprop-tetrafluoroeth, sodium  chloride flush  Physical Exam  Constitutional: He appears well-nourished.  HENT:  Head: Normocephalic and atraumatic.  Cardiovascular: Normal rate and regular rhythm.   Not on pressors  Pulmonary/Chest:  Off vent. On trach collar  Neurological: He is alert.  Communicating relevantly  Skin: Skin is warm and dry.  Nursing note and vitals reviewed.           Vital Signs: BP (!) 96/56   Pulse (!) 102   Temp 98.8 F (37.1 C) (Oral)   Resp 17   Ht '5\' 6"'$  (1.676 m)   Wt (!) 154.2 kg (340 lb) Comment: I feel this weight may be inaccurate  SpO2  90%   BMI 54.88 kg/m  SpO2: SpO2: 90 % O2 Device: O2 Device: Tracheostomy Collar O2 Flow Rate: O2 Flow Rate (L/min): 6 L/min  Intake/output summary:  Intake/Output Summary (Last 24 hours) at 06/10/16 1007 Last data filed at 06/10/16 1000  Gross per 24 hour  Intake              970 ml  Output             2545 ml  Net            -1575 ml   LBM: Last BM Date: 06/10/16 Baseline Weight: Weight: (!) 170.1 kg (375 lb) Most recent weight: Weight: (!) 154.2 kg (340 lb) (I feel this weight may be inaccurate)       Palliative Assessment/Data:    Flowsheet Rows     Most Recent Value  Intake Tab  Referral Department  Critical care  Unit at Time of Referral  ICU  Palliative Care Primary Diagnosis  Pulmonary  Date Notified  06/09/16  Palliative Care Type  New Palliative care  Reason for referral  Clarify Goals of Care  Date of Admission  06/24/2016  Date first seen by Palliative Care  06/09/16  # of days Palliative referral response time  0 Day(s)  # of days IP prior to Palliative referral  1  Clinical Assessment  Palliative Performance Scale Score  20%  Pain Max last 24 hours  Not able to report  Pain Min Last 24 hours  Not able to report  Dyspnea Max Last 24 Hours  Not able to report  Dyspnea Min Last 24 hours  Not able to report  Nausea Max Last 24 Hours  Not able to report  Nausea Min Last 24 Hours  Not able to report  Anxiety Max  Last 24 Hours  Not able to report  Anxiety Min Last 24 Hours  Not able to report  Other Max Last 24 Hours  Not able to report  Psychosocial & Spiritual Assessment  Palliative Care Outcomes  Patient/Family meeting held?  No  Palliative Care follow-up planned  Yes, Facility      Patient Active Problem List   Diagnosis Date Noted  . Palliative care encounter   . Acute respiratory failure with hypercapnia ([place])   . AKI (acute kidney injury) ([place])   . Hepatic lesion 05/22/2016  . Shock liver 05/17/2016  . Current use of proton pump inhibitor 05/17/2016  . Contraindication to deep vein thrombosis (DVT) prophylaxis 05/17/2016  . Retroperitoneal hemorrhage 05/15/2016  . Goals of care, counseling/discussion 05/15/2016  . On enteral nutrition 05/15/2016  . Acute renal failure (ARF) ([place]) 05/15/2016  . Electrolyte imbalance 05/15/2016  . Altered mental status   . Acute congestive heart failure ([place])   . Shock circulatory ([place])   . Acute respiratory failure with hypoxemia ([place]) 05/16/2016  . Thrombocytopenia ([place]) 05/04/2014  . 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 Hemiparesis affecting right side as late effect of cerebrovascular accident ([place]) 09/11/2013  . Hypothyroidism 09/11/2013  . Cerebrovascular accident with involvement of right side of body ([place]) 04/17/2013  . Allergic rhinitis 06/06/2012  . Gout 06/06/2012  . Osteoarthrosis and allied disorders   . Hyperlipidemia   . Vitamin D deficiency   . Hypertension   . Asthma   . Paroxysmal atrial fibrillation ([place]) 03/10/2009  . OBESITY, MORBID 03/02/2009    Palliative Care Assessment & Plan   Patient Profile: 71 y.o. male  with past medical  history of Obesity (current weight 340 pounds; albumin 1.7), smile, atrial fib, gout, hypertension, multinodular goiter, osteoarthritis, hyperlipidemia, stroke, thrombocytopenia, vitamin D deficiency admitted from [facility] on  06/02/2016 . Critically ill after testing positive for the  flu, pneumonia, with altered mental status. Patient developed a retroperitoneal bleed secondary to heparin. He also developed being in acute on chronic renal failure and has been on CRRT. He has undergone a trach and has been intermittently on ventilator support. He is not currently on the ventilator. He is alert shaking his head yes or no, answers appear to be relevant.   He is now off pressors and has tolerated hemodialysis   Assessment: Had a goals of care meeting with patient's granddaughter, Jaystin Mcgarvey. Her boyfriend was present for conversation. Ms Landfair shared with me that they have been caring for her grandfather and grandmother ( up until this past Christmas when she died) for the past 6 years.. Mr. Bottger has been nonambulatory for the past 6 years. He has movement of his left upper extremity and left lower extremity but is unable to bear full weight on LLE because of his size. Family has been caring for him in this state helping him into a wheelchair and back into the bed with assistive devices such as hoyer lifts and slide boards. Granddaughter describes a big family support to continue keep delivering this level of care in the home, in the future. It is their goal that he return home. Ms Winsor states that in terms of quality of life she does not feel as though her grandfather would be able to find quality of life if he found himself living in a facility. She does feel like that if he required hemodialysis going forward , family could assist him to a seated position he would want to do hemodialysis. Prior to this admission he was able to maintain a seated position for hours at a time. Short term goals are to go to [facility] for further stabilization, HD and rehab to his baseline. Granddaughter states that she would have to revisit goals only if his options were LTAC out-of-town, or [facility]. She is still wanting to pursue transfer to [facility]. She reports he has asked for the  transfer  Recommendations/Plan:  Continue full aggressive care  Full Code  Transfer to [facility]  Transfer to [facility] upon medical stabilization either here or at [place] and Additional Recommendations:  Limitations on Scope of Treatment: Full Scope Treatment  Code Status:    Code Status Orders        Start     Ordered   05/09/16 0131  Full code  Continuous     05/09/16 0133    Code Status History    Date Active Date Inactive Code Status Order ID Comments User Context   This patient has a current code status but no historical code status.       Prognosis:   Unable to determine  Discharge Planning:  To Be Determined  Care plan was discussed with 1600  Thank you for allowing the Palliative Medicine Team to assist in the care of this patient.   Time In: 1600 Time Out: 1700 Total Time 60 min Prolonged Time Billed  no       Greater than 50%  of this time was spent counseling and coordinating care related to the above assessment and plan.  Dory Horn, NP  Please contact Palliative Medicine Team phone at [phone]  for questions and concerns.

## 2016-06-10 NOTE — Progress Notes (Signed)
Subjective: Interval History: has no complaint, .  Objective: Vital signs in last 24 hours: Temp:  [97.9 F (36.6 C)-99.6 F (37.6 C)] 98.3 F (36.8 C) (04/07 0315) Pulse Rate:  [97-116] 106 (04/07 0600) Resp:  [4-36] 36 (04/07 0400) BP: (86-123)/(50-68) 92/58 (04/07 0600) SpO2:  [90 %-99 %] 90 % (04/07 0600) FiO2 (%):  [28 %] 28 % (04/07 0313) Weight change:   Intake/Output from previous day: 04/06 0701 - 04/07 0700 In: 1010 [NG/GT:960; IV Piggyback:50] Out: 2577 [Urine:187; Stool:300] Intake/Output this shift: No intake/output data recorded.  General appearance: cooperative, morbidly obese and not obeying all commands. Neck: RIJ cath Resp: rales bibasilar, rhonchi bilaterally and wheezes bilaterally Cardio: irregularly irregular rhythm and systolic murmur: holosystolic 2/6, blowing at apex GI: massive obesity, pos bs Extremities: massive obesity, pos bs  2-3+ edema  Lab Results:  Recent Labs  06/09/16 0423 06/10/16 0420  WBC 5.7 5.1  HGB 7.4* 7.6*  HCT 24.1* 24.6*  PLT 77* 67*   BMET:  Recent Labs  06/09/16 0423 06/10/16 0500  NA 134* 134*  K 4.3 3.6  CL 101 98*  CO2 26 27  GLUCOSE 110* 109*  BUN 81* 63*  CREATININE 3.84* 3.09*  CALCIUM 8.1* 8.0*   No results for input(s): PTH in the last 72 hours. Iron Studies: No results for input(s): IRON, TIBC, TRANSFERRIN, FERRITIN in the last 72 hours.  Studies/Results: Dg Chest Port 1 View  Result Date: 06/09/2016 CLINICAL DATA:  Hypoxia EXAM: PORTABLE CHEST 1 VIEW COMPARISON:  June 01, 2016 FINDINGS: Tracheostomy catheter tip is 6.4 cm above the carina. Feeding tube tip is below the diaphragm. Central catheter tip is in the superior vena cava. No pneumothorax. There is patchy bibasilar atelectasis. Lungs elsewhere are clear. Heart is mildly enlarged with pulmonary venous hypertension. No adenopathy. No bone lesions. There are surgical clips in the right lower neck region. IMPRESSION: Tube and catheter positions  as described without evident pneumothorax. There is pulmonary vascular congestion without frank edema or consolidation. There is mild bibasilar atelectasis. Electronically Signed   By: Bretta Bang III M.D.   On: 06/09/2016 07:27    I have reviewed the patient's current medications.  Assessment/Plan: 1 AKI oliguric ATN.  Vol xs. Will do HD again today to try to avoid Sun 2 Anemia esa 3 DM controlled 4 Resp faillure  5 massive obesity 6 Arm fx 7 RP hematoma  8 low ptlt 9 low bps P HD, esa, Ne as needed. , AB, TF    LOS: 33 days   Angelly Spearing L 06/10/2016,7:04 AM

## 2016-06-10 NOTE — Progress Notes (Signed)
Dialysis treatment completed.  2100 mL ultrafiltrated.  1500 mL net fluid removal.  Patient status unchanged. Lung sounds rhonchi to ausculation in all fields. Moderate generalized edema. Cardiac: ST, Afib.  Cleansed RIJ catheter with chlorhexidine.  Disconnected lines and flushed ports with saline per protocol.  Ports locked with heparin and capped per protocol.    Report given to bedside, RN Lauren.

## 2016-06-10 NOTE — Progress Notes (Signed)
Arrived to patient room 25M-03.  Reviewed treatment plan and this RN agrees with plan.  Report received from bedside RN, Lauern.  Consent verified.  Patient Alert, responds to voice.   Lung sounds rhonchi to ausculation in all fields. Moderate generalized pitting edema. Cardiac:  ST to AFib.  Removed caps and cleansed RIJ catheter with chlorhedxidine.  Aspirated ports of heparin and flushed them with saline per protocol.  Connected and secured lines, initiated treatment at 1100.  UF Goal of 2500 mL and net fluid removal 2 L.  Will continue to monitor.

## 2016-06-10 NOTE — Progress Notes (Signed)
PCCM Progress Note  Admission date: 05/04/2016 Referring provider: Dr. Clarene Duke, [facility] ER  CC: altered mental status  HPI: 71 y/o male from [facility] admitted 3/5 with altered mental status + H.flu PNA with aspiration, sepsis, hypoxic/hypercapnic respiratory failure. Course c/b retroperitoneal bleed while on heparin with subsequent shock, acute renal failure & prolonged ventilation requiring tracheostomy. On CRRT intermittently through hosp and has not tolerated HD due to hypotension. Palliative care has been consulted for goals of care discussion as pt has unreasonable expectations for recovery.    Antibiotics: Zosyn 3/06 >> 3/08 Rocephin (H.Flu PNA) 3/08 >> 3/14 Vancomycin 3/23 >> 3/28 Cefepime 3/23 >> 4/1 Meropenem 4/1 >> Vancomycin 4/3 >> 4/4  Cultures: Urine 3/05 >> negative Blood 3/06 >> negative Resp 3/6 >> H flu (b-lactamase +) Trach stoma site 3/23 >> diphtheroids Resp cx 3/28 >>nl flora Blood 4/1 NGTD  Lines/tubes: ETT 3/05 >> 3/8 ETT 3/10 >> 3/21 Trach (Dr Jenne Pane) 3/21 >>  Lt IJ CVL 3/07 >> 3/27 R HD catheter  >> 3/22 R IJ HD catheter 3/23 >>   Events: 3/05  Transfer to [facility] Sputum 3/06 >> Haemophilus influenzae (beta lactamase positive) Echo 3/06 >> EF 65 to 70%, mod LVH, mod TR, PAS 39 mmHg CT head 3/7 >> no acute abnormality  3/10 - Large retroperitoneal bleed noted on CT after c/o back pain/leg pain 3/27 rt humerus fracture identified - ortho consult 3/27 RUQ [place] .Marland Kitchen Not helpful due to body habitus. On ATC with copious secrtions.  3/29 - Chronic  critically ill 3/27 - end CRRT 3/30 - doing ATC x 24-48h per RN. Rt shoulder pain only when touched per RN. Making urine. Off CRRT. Not on pressors. Family not fully on board about LTAC per RN but looking at [facility]. Needs LTAC.  4/2 - resumed CRRT 4/3 - Family agreeable to PEG tube 4/5 - G-tube to be placed by gen surg however anesthesia refused 2/2 hypotension.  4/6 - restarted HD. Tolerated.    SUBJECTIVE On ATC at  least 2 days. Tolerating well. More "awake" Tolerated HD on 4/6 and BP was OK.  Tolerating TF.    Vital signs: BP (!) 96/56   Pulse (!) 102   Temp 98.8 F (37.1 C) (Oral)   Resp 17   Ht  (1.676 m)   Wt (!) 154.2 kg (340 lb) Comment: I feel this weight may be inaccurate  SpO2 90%   BMI 54.88 kg/m   Intake/output:  Intake/Output Summary (Last 24 hours) at 06/10/16 1025 Last data filed at 06/10/16 1000  Gross per 24 hour  Intake              970 ml  Output             2545 ml  Net            -1575 ml   I/O last 3 completed shifts: In: 1450 [NG/GT:1400; IV Piggyback:50] Out: 2877 [Urine:187; VWUJW:1191; Stool:400]  EXAM General: [name]. Obese. NAD. Answers simple questions.  HENT: [place]/AT. No obv abnormality. EOMI. +TRACH, +NG. Comfortable.  Lungs: Coarse breath sounds throughout Chest wall: not tender to palp. Dec BS on BLF. Crackles at bases.  Heart: IRIR. Holosystolic murmur 2/6.  Abdomen: soft obese abdomen, non tender, not distended. +bs Extremities: 3+ edema BL Neuro: Alert and interactive.   LABS  CBC Recent Labs     06/06/2016  0500  06/09/16  0423  06/10/16  0420  WBC  5.1  5.7  5.1  HGB  7.3*  7.4*  7.6*  HCT  23.3*  24.1*  24.6*  PLT  52*  77*  67*    Coag's Recent Labs     24-Jun-2016  0550  INR  1.33    BMET Recent Labs     June 24, 2016  0500  06/09/16  0423  06/10/16  0500  NA  134*  134*  134*  K  4.2  4.3  3.6  CL  102  101  98*  CO2  BUN  59*  81*  63*  CREATININE  2.71*  3.84*  3.09*  GLUCOSE  91  110*  109*    Electrolytes Recent Labs     06/07/16  1428  June 24, 2016  0500  06/09/16  0423  06/10/16  0500  CALCIUM  7.8*  8.0*  8.1*  8.0*  MG   --   2.2   --    --   PHOS  2.7  3.4   --   5.6*    Sepsis Markers No results for input(s): PROCALCITON, O2SATVEN in the last 72 hours.  Invalid input(s): LACTICACIDVEN  ABG No results for input(s): PHART, PCO2ART, PO2ART in the last 72  hours.  Liver Enzymes Recent Labs     06/07/16  1428  06-24-2016  0500  06/10/16  0500  ALBUMIN  1.7*  1.7*  1.7*    Cardiac Enzymes No results for input(s): TROPONINI, PROBNP in the last 72 hours.  Glucose Recent Labs     06/09/16  1145  06/09/16  1530  06/09/16  1932  06/09/16  2327  06/10/16  0317  06/10/16  0749  GLUCAP  100*  107*  100*  118*  105*  104*    Imaging Dg Chest Port 1 View  Result Date: 06/09/2016 CLINICAL DATA:  Hypoxia EXAM: PORTABLE CHEST 1 VIEW COMPARISON:  June 01, 2016 FINDINGS: Tracheostomy catheter tip is 6.4 cm above the carina. Feeding tube tip is below the diaphragm. Central catheter tip is in the superior vena cava. No pneumothorax. There is patchy bibasilar atelectasis. Lungs elsewhere are clear. Heart is mildly enlarged with pulmonary venous hypertension. No adenopathy. No bone lesions. There are surgical clips in the right lower neck region. IMPRESSION: Tube and catheter positions as described without evident pneumothorax. There is pulmonary vascular congestion without frank edema or consolidation. There is mild bibasilar atelectasis. Electronically Signed   By: Bretta Bang III M.D.   On: 06/09/2016 07:27     DISCUSSION: 71 year old man, history of fibrillation, obesity, admitted with respiratory failure and septic shock in the setting of H flu pneumonia. Subsequent course complicated by retroperitoneal bleeding on anticoagulation, hemorrhagic shock, acute respiratory failure and acute renal failure. s/p trach. Currently back on vent/trach and CRRT vs HD. Unable to tolerate placement of PEG or G-tube 2/2 anatomy and hypotension. Unfortunately has rt humerus fracture -conservative mx  ASSESSMENT / PLAN:  PULMONARY A: Acute on chronic hypoxemic respiratory failure Pneumonia, H influenza (beta lactamase positive). Concern for new HCAP on 4/1.  Pulm edema, improved Presumed OSA/OHS History of asthma P:   Cont ATC, keep o2 sats > 88% Off  vent at least 48 hrs now. Ven prn.  duoneb qid + pulmicort BID Cont IV Meropenem, stop date 4/8   CARDIOVASCULAR A:  Shock, principally hemorrhagic +/- septic. Improved. Off pressors 3/31.  Paroxysmal atrial fibrillation History of hypertension PLAN Off stress dose steroids, continue to hold anti-hypertensives.  Req pressors 4/1, currently off but may require re initiation Cont midodrine (started on 4/5)  RENAL I/O last 3 completed shifts: In: 1450 [NG/GT:1400; IV Piggyback:50] Out: 2877 [Urine:187; ZOXWR:6045; Stool:400] Lab Results  Component Value Date   CREATININE 3.09 (H) 06/10/2016   CREATININE 3.84 (H) 06/09/2016   CREATININE 2.71 (H) 06/13/2016   CREATININE 0.80 07/16/2012   A:   Acute renal failure, presumed due to ATN in the setting of hemorrhagic shock. Pt intermittently on CRRT and unable to tolerate HD 2/2 hypotension.  P: Pt was on CRRT but transitioned to HD on 4/6 and he tolerated HD.  Plan to cont HD.  Cont Midodrine 10 mg TID    GASTROINTESTINAL A:   Large retroperitoneal bleed noted 3/10, some slight increased 3/18 CT scan. Hb has been stable.   Shock liver improving.  Hepatic lesion; noted on CT scan of the abdomen 3/18. 5 cm lesion of the dome of the liver, could represent blood or infection or infarct.  Protein calorie malnutrition P:   Continue PPI Continue tube feeds PEG/G tube unable to be placed.  IR unable to place 2/2 complicated anatomy. Gen surg unable to place yesterday 2/2 hypotension. Need to address nutrition issues with family.    HEMATOLOGIC  Recent Labs Lab 06/30/2016 0500 06/09/16 0423 06/10/16 0420  HGB 7.3* 7.4* 7.6*  HCT 23.3* 24.1* 24.6*  WBC 5.1 5.7 5.1  PLT 52* 77* 67*   A:   Acute blood loss anemia, improved Severe heparin sensitivity, associated with hemorrhage Thrombocytopenia, acute on chronic P:  Follow CBC, Goal hemoglobin greater than 7.0.  No recent bleeding or transfusion.  No heparin for now.  Hold  anticoagulation  SCD for DVT prophylaxis   INFECTIOUS A:  H. influenzae pneumonia, treated HCAP Septic shock, resolved.  P:  Antibiotics for initial pneumonia were completed Cefepime, started 3/23, changed to [facility] 06/04/16 for possible HCAP. Clinically improved. Plan to d/c meropenem on 4/8.  Cultures (-) so far.    ENDOCRINE CBG (last 3)   Recent Labs  06/09/16 2327 06/10/16 0317 06/10/16 0749  GLUCAP 118* 105* 104*   A:   Hyperglycemia, on steroids Hypothyroidism P:   Continue SSI, CBG Continue Synthroid   NEUROLOGIC A:  Metabolic uremic encephalopathy History of CVA with residual deficits Rt humerus fracture  P:   Observe mental status for now. Slowly improving PT consult - LTAC recommended, family unsure.  RUE sling Morphine prn pain    MSK A Rt shoulder fracture during hospital stay P: sling + conserv management   FAMILY DISCUSSIONS: Family OK with PEG tube as of 4/3 but unfortunately pt could not tolerate anesthesia. Family hostile, see prior notes. LTAC recommended, granddaughter requests staying at [facility] vs [facility]. Granddaughter HCPOA. My plan is to speak to family again.  He may not need ICU level of care anymore.  Will need to talk to [facility] re: transfer.    CODE STATUS: Full Code    I spent  30  minutes of Critical Care time with this patient today.   Pollie Meyer, MD 06/10/2016, 10:30 AM [facility] Pager [phone] After 3 pm or if no answer, call [phone]

## 2016-06-11 DIAGNOSIS — Z430 Encounter for attention to tracheostomy: Secondary | ICD-10-CM

## 2016-06-11 DIAGNOSIS — L8990 Pressure ulcer of unspecified site, unspecified stage: Secondary | ICD-10-CM | POA: Insufficient documentation

## 2016-06-11 LAB — BASIC METABOLIC PANEL
ANION GAP: 8 (ref 5–15)
BUN: 44 mg/dL — ABNORMAL HIGH (ref 6–20)
CHLORIDE: 96 mmol/L — AB (ref 101–111)
CO2: 29 mmol/L (ref 22–32)
CREATININE: 2.52 mg/dL — AB (ref 0.61–1.24)
Calcium: 8 mg/dL — ABNORMAL LOW (ref 8.9–10.3)
GFR calc non Af Amer: 24 mL/min — ABNORMAL LOW (ref >60)
GFR, EST AFRICAN AMERICAN: 28 mL/min — AB (ref >60)
Glucose, Bld: 95 mg/dL (ref 65–99)
Potassium: 4 mmol/L (ref 3.5–5.1)
SODIUM: 133 mmol/L — AB (ref 135–145)

## 2016-06-11 LAB — MRSA PCR SCREENING: MRSA by PCR: INVALID — AB

## 2016-06-11 LAB — CBC
HEMATOCRIT: 25.7 % — AB (ref 39.0–52.0)
HEMOGLOBIN: 7.8 g/dL — AB (ref 13.0–17.0)
MCH: 33.6 pg (ref 26.0–34.0)
MCHC: 30.4 g/dL (ref 30.0–36.0)
MCV: 110.8 fL — AB (ref 78.0–100.0)
Platelets: 78 10*3/uL — ABNORMAL LOW (ref 150–400)
RBC: 2.32 MIL/uL — ABNORMAL LOW (ref 4.22–5.81)
WBC: 4.1 10*3/uL (ref 4.0–10.5)

## 2016-06-11 LAB — GLUCOSE, CAPILLARY
GLUCOSE-CAPILLARY: 108 mg/dL — AB (ref 65–99)
GLUCOSE-CAPILLARY: 115 mg/dL — AB (ref 65–99)
GLUCOSE-CAPILLARY: 86 mg/dL (ref 65–99)
GLUCOSE-CAPILLARY: 99 mg/dL (ref 65–99)
GLUCOSE-CAPILLARY: 113 mg/dL — AB (ref 65–99)
Glucose-Capillary: 108 mg/dL — ABNORMAL HIGH (ref 65–99)

## 2016-06-11 LAB — RENAL FUNCTION PANEL
ANION GAP: 8 (ref 5–15)
Albumin: 1.7 g/dL — ABNORMAL LOW (ref 3.5–5.0)
BUN: 44 mg/dL — ABNORMAL HIGH (ref 6–20)
CO2: 29 mmol/L (ref 22–32)
CREATININE: 2.53 mg/dL — AB (ref 0.61–1.24)
Calcium: 7.9 mg/dL — ABNORMAL LOW (ref 8.9–10.3)
Chloride: 96 mmol/L — ABNORMAL LOW (ref 101–111)
GFR, EST AFRICAN AMERICAN: 28 mL/min — AB (ref >60)
GFR, EST NON AFRICAN AMERICAN: 24 mL/min — AB (ref >60)
Glucose, Bld: 96 mg/dL (ref 65–99)
POTASSIUM: 4 mmol/L (ref 3.5–5.1)
Phosphorus: 4.5 mg/dL (ref 2.5–4.6)
Sodium: 133 mmol/L — ABNORMAL LOW (ref 135–145)

## 2016-06-11 NOTE — Progress Notes (Signed)
Subjective: Interval History: has no complaint.  Objective: Vital signs in last 24 hours: Temp:  [97.5 F (36.4 C)-99.4 F (37.4 C)] 99.4 F (37.4 C) (04/08 0728) Pulse Rate:  [91-119] 119 (04/08 0901) Resp:  [17-33] 28 (04/08 0901) BP: (78-137)/(45-111) 88/49 (04/08 0728) SpO2:  [91 %-100 %] 96 % (04/08 0901) FiO2 (%):  [28 %] 28 % (04/08 0901) Weight change:   Intake/Output from previous day: 04/07 0701 - 04/08 0700 In: 1160 [NG/GT:1110; IV Piggyback:50] Out: 2200 [Urine:25; Stool:675] Intake/Output this shift: No intake/output data recorded.  General appearance: alert, cooperative and morbidly obese Neck: IJ cath Resp: rales bibasilar and rhonchi bibasilar Cardio: irregularly irregular rhythm and systolic murmur: holosystolic 2/6, blowing at apex GI: massive, pos bs, abdm wall edema Extremitie3+edema 3+  Lab Results:  Recent Labs  06/10/16 0420 06/11/16 0500  WBC 5.1 4.1  HGB 7.6* 7.8*  HCT 24.6* 25.7*  PLT 67* 78*   BMET:  Recent Labs  06/10/16 0500 06/11/16 0500  NA 134* 133*  133*  K 3.6 4.0  4.0  CL 98* 96*  96*  CO2 GLUCOSE 109* 96  95  BUN 63* 44*  44*  CREATININE 3.09* 2.53*  2.52*  CALCIUM 8.0* 7.9*  8.0*   No results for input(s): PTH in the last 72 hours. Iron Studies: No results for input(s): IRON, TIBC, TRANSFERRIN, FERRITIN in the last 72 hours.  Studies/Results: No results found.  I have reviewed the patient's current medications.  Assessment/Plan: 1 AKI oliguric ATN.  Vol xs yet , with tenuous bp will need daily HD, .  Massive so needs longer HD.  Not clear will ever be candidate for outpatient HD.   Will try to get PC 2 Resp failure with trach limits outpatient poss 3 Massive obesity 4 CVA 5 Arm fx 6 RP bleed 7 DM 8 Anemia esa, /Fe 9 low ptlt 10 afib 11 Nutrition needs PEG P HD, esa, resp care, Pc, TF , Peg   LOS: 34 days   Tristine Langi L 06/11/2016,9:36 AM

## 2016-06-11 NOTE — Progress Notes (Signed)
Notified by microbiology lab personnel that MRSA PCR swab has been inconclusive in house testing and will be sent out for further processing, results to be delayed by 1 day

## 2016-06-11 NOTE — Plan of Care (Signed)
Problem: Education: Goal: Knowledge of  General Education information/materials will improve Outcome: Progressing Patient oriented to new unit from 72M. Will require reorientation. Family made aware by ICU that patient was to be transferred here. Family will be oriented on day shift to unit.   Problem: Skin Integrity: Goal: Risk for impaired skin integrity will decrease Outcome: Progressing Skin thoroughly assessed and dressings changed. Patient skin very dry and required bathing. Stage 2 discovered at top of buttocks in gluteal fold under foam. Foam reapplied. Patient on air mattress with attention to keeping skin dry and clean. Flexiseal intact but leaks.

## 2016-06-11 NOTE — Progress Notes (Signed)
PCCM Progress Note  Admission date: 05/30/2016 Referring provider: Dr. Clarene Duke, [facility]  CC: altered mental status  HPI: 71 y/o male from [facility] admitted 3/5 with altered mental status + H.flu PNA with aspiration, sepsis, hypoxic/hypercapnic respiratory failure. Course c/b retroperitoneal bleed while on heparin with subsequent shock, acute renal failure & prolonged ventilation requiring tracheostomy. On CRRT intermittently through hosp and has not tolerated HD due to hypotension. Palliative care has been consulted for goals of care discussion as pt has unreasonable expectations for recovery.    Antibiotics: Zosyn 3/06 >> 3/08 Rocephin (H.Flu PNA) 3/08 >> 3/14 Vancomycin 3/23 >> 3/28 Cefepime 3/23 >> 4/1 Meropenem 4/1 >> Vancomycin 4/3 >> 4/4  Cultures: Urine 3/05 >> negative Blood 3/06 >> negative Resp 3/6 >> H flu (b-lactamase +) Trach stoma site 3/23 >> diphtheroids Resp cx 3/28 >>nl flora Blood 4/1 NGTD  Lines/tubes: ETT 3/05 >> 3/8 ETT 3/10 >> 3/21 Trach (Dr Jenne Pane) 3/21 >>  Lt IJ CVL 3/07 >> 3/27 R HD catheter  >> 3/22 R IJ HD catheter 3/23 >>   Events: 3/05  Transfer to [facility] Sputum 3/06 >> Haemophilus influenzae (beta lactamase positive) Echo 3/06 >> EF 65 to 70%, mod LVH, mod TR, PAS 39 mmHg CT head 3/7 >> no acute abnormality  3/10 - Large retroperitoneal bleed noted on CT after c/o back pain/leg pain 3/27 rt humerus fracture identified - ortho consult 3/27 RUQ [place] .Marland Kitchen Not helpful due to body habitus. On ATC with copious secrtions.  3/29 - Chronic  critically ill 3/27 - end CRRT 3/30 - doing ATC x 24-48h per RN. Rt shoulder pain only when touched per RN. Making urine. Off CRRT. Not on pressors. Family not fully on board about LTAC per RN but looking at [facility]. Needs LTAC.  4/2 - resumed CRRT 4/3 - Family agreeable to PEG tube 4/5 - G-tube to be placed by gen surg however anesthesia refused 2/2 hypotension.  4/6 - restarted HD. Tolerated.  4/7 - transferred to  SDU   SUBJECTIVE No issues overnight. Remains off the vent. Tolerated HD on 4/6 and 4/7.  Remains on ATC.  Tolerating TF.    Vital signs: BP (!) 88/49 (BP Location: Left Arm)   Pulse (!) 119   Temp 99.4 F (37.4 C) (Axillary)   Resp (!) 28   Ht  (1.676 m)   Wt (!) 154.2 kg (340 lb) Comment: I feel this weight may be inaccurate  SpO2 96%   BMI 54.88 kg/m   Intake/output:  Intake/Output Summary (Last 24 hours) at 06/11/16 1018 Last data filed at 06/11/16 0700  Gross per 24 hour  Intake              990 ml  Output             2200 ml  Net            -1210 ml   I/O last 3 completed shifts: In: 1600 [NG/GT:1550; IV Piggyback:50] Out: 2600 [Urine:125; Other:1500; Stool:975]  EXAM General: chronically ill pt. Obese. NAD. Answers questions. Follows commands. Appropriate.   HENT: [place]/AT. No obv abnormality. EOMI. +TRACH, +NG. Comfortable.  Lungs: Coarse breath sounds throughout Chest wall: not tender to palp. Dec BS on BLF. Crackles at bases.  Heart: IRIR. Holosystolic murmur 2/6.  Abdomen: soft obese abdomen, non tender, not distended. +bs Extremities: 3+ edema BL Neuro: Alert and interactive.   LABS  CBC Recent Labs     06/09/16  0423  06/10/16  0420  06/11/16  0500  WBC  5.7  5.1  4.1  HGB  7.4*  7.6*  7.8*  HCT  24.1*  24.6*  25.7*  PLT  77*  67*  78*    Coag's No results for input(s): APTT, INR in the last 72 hours.  BMET Recent Labs     06/09/16  0423  06/10/16  0500  06/11/16  0500  NA  134*  134*  133*  133*  K  4.3  3.6  4.0  4.0  CL  101  98*  96*  96*  CO2  BUN  81*  63*  44*  44*  CREATININE  3.84*  3.09*  2.53*  2.52*  GLUCOSE  110*  109*  96  95    Electrolytes Recent Labs     06/09/16  0423  06/10/16  0500  06/11/16  0500  CALCIUM  8.1*  8.0*  7.9*  8.0*  PHOS   --   5.6*  4.5    Sepsis Markers No results for input(s): PROCALCITON, O2SATVEN in the last 72 hours.  Invalid input(s):  LACTICACIDVEN  ABG No results for input(s): PHART, PCO2ART, PO2ART in the last 72 hours.  Liver Enzymes Recent Labs     06/10/16  0500  06/11/16  0500  ALBUMIN  1.7*  1.7*    Cardiac Enzymes No results for input(s): TROPONINI, PROBNP in the last 72 hours.  Glucose Recent Labs     06/10/16  1130  06/10/16  1552  06/10/16  2015  06/10/16  2337  06/11/16  0423  06/11/16  0730  GLUCAP  110*  83  106*  106*  108*  108*    Imaging No results found.   DISCUSSION: 71 year old man, history of fibrillation, obesity, admitted with respiratory failure and septic shock in the setting of H flu pneumonia. Subsequent course complicated by retroperitoneal bleeding on anticoagulation, hemorrhagic shock, acute respiratory failure and acute renal failure. s/p trach. Currently back on vent/trach and CRRT vs HD. Unable to tolerate placement of PEG or G-tube 2/2 anatomy and hypotension. Unfortunately has rt humerus fracture -conservative mx  ASSESSMENT / PLAN:  PULMONARY A: Acute on chronic hypoxemic respiratory failure, improved.  Pneumonia, H influenza (beta lactamase positive), treated. Concern for new HCAP on 4/1 Pulm edema, improved Presumed OSA/OHS History of asthma P:   Cont ATC, keep o2 sats > 88% Off vent several days now. Vent prn.  duoneb qid + pulmicort BID Cont IV Meropenem until 4/8. D/C meropenem after today.  Trache change c/o ENT.    CARDIOVASCULAR A:  Shock, principally hemorrhagic +/- septic. Improved. Off pressors 3/31.  Paroxysmal atrial fibrillation History of hypertension PLAN Off stress dose steroids, continue to hold anti-hypertensives. Cont midodrine (started on 4/5)  RENAL I/O last 3 completed shifts: In: 1600 [NG/GT:1550; IV Piggyback:50] Out: 2600 [Urine:125; Other:1500; Stool:975] Lab Results  Component Value Date   CREATININE 2.53 (H) 06/11/2016   CREATININE 2.52 (H) 06/11/2016   CREATININE 3.09 (H) 06/10/2016   CREATININE 0.80 07/16/2012    A:   Acute renal failure, presumed due to ATN in the setting of hemorrhagic shock. Pt intermittently on CRRT and unable to tolerate HD 2/2 hypotension. Currently on HD since 4/6 P: Pt was on CRRT but transitioned to HD on 4/6 and he tolerated HD.  Plan to cont HD.  Cont Midodrine 10 mg TID    GASTROINTESTINAL A:   Large  retroperitoneal bleed noted 3/10, some slight increased 3/18 CT scan. Hb has been stable.   Shock liver improving.  Hepatic lesion; noted on CT scan of the abdomen 3/18. 5 cm lesion of the dome of the liver, could represent blood or infection or infarct.  Protein calorie malnutrition P:   Continue PPI Continue tube feeds PEG/G tube unable to be placed.  IR unable to place 2/2 complicated anatomy. Gen surg unable to place  2/2 hypotension. Need to address nutrition issues with family. Suggest to ask surgery again or IR this week re: PEG tube placement.    HEMATOLOGIC  Recent Labs Lab 06/09/16 0423 06/10/16 0420 06/11/16 0500  HGB 7.4* 7.6* 7.8*  HCT 24.1* 24.6* 25.7*  WBC 5.7 5.1 4.1  PLT 77* 67* 78*   A:   Acute blood loss anemia, improved Severe heparin sensitivity, associated with hemorrhage Thrombocytopenia, acute on chronic P:  Follow CBC, Goal hemoglobin greater than 7.0.  No recent bleeding or transfusion.  No heparin for now.  Hold anticoagulation  SCD for DVT prophylaxis   INFECTIOUS A:  H. influenzae pneumonia, treated HCAP (presumed) Septic shock, resolved.  P:  Antibiotics for initial pneumonia were completed Cefepime, started 3/23, changed to [facility] 06/04/16 for possible HCAP. Clinically improved. Plan to d/c meropenem after 4/8.  Cultures (-) so far.    ENDOCRINE CBG (last 3)   Recent Labs  06/10/16 2337 06/11/16 0423 06/11/16 0730  GLUCAP 106* 108* 108*   A:   Hyperglycemia, on steroids Hypothyroidism P:   Continue SSI, CBG Continue Synthroid   NEUROLOGIC A:  Metabolic uremic encephalopathy, improved.  History  of CVA with residual deficits Rt humerus fracture  P:   Observe mental status for now. Slowly improving PT consult - LTAC recommended, family unsure.  RUE sling Morphine prn pain    MSK A Rt shoulder fracture during hospital stay P: sling + conserv management   FAMILY DISCUSSIONS:   Family OK with PEG tube as of 4/3 but unfortunately pt could not tolerate anesthesia. Need to address PEG issues this week.   Family is very hostile. Dr. Christene Slates spoke with granddaughter on 4/8 and updated her of pts condition that he was transferred to SDU.  I asked re: being transferred to [facility] >> she will discuss with other family members. She wanted me to f/u on [facility] transfer.  I mentioned pt does not need ICU anymore.  She insisted I still follow up with [facility].    I called and spoke with Dr. Shary Key at [facility] [phone]) and discussed case with her.  She agrees with LTAC transfer. She has no issues with transfer to [facility] SDU but their SDU is full. She will place him on the list just  in case family ends up being very adamant re: transfer to [facility].   CODE STATUS: Full Code  TRH will be primary starting 4/9.  PCCM will follow for the vent.  I discussed case with Dr. Nelson Chimes.   Pollie Meyer, MD 06/11/2016, 10:18 AM [facility] Pager [phone] After 3 pm or if no answer, call [phone]

## 2016-06-12 ENCOUNTER — Inpatient Hospital Stay (HOSPITAL_COMMUNITY): Payer: Medicare Other

## 2016-06-12 ENCOUNTER — Encounter (HOSPITAL_COMMUNITY): Payer: Self-pay | Admitting: Interventional Radiology

## 2016-06-12 DIAGNOSIS — Z789 Other specified health status: Secondary | ICD-10-CM

## 2016-06-12 DIAGNOSIS — R1310 Dysphagia, unspecified: Secondary | ICD-10-CM

## 2016-06-12 HISTORY — PX: IR FLUORO GUIDE CV LINE LEFT: IMG2282

## 2016-06-12 HISTORY — PX: IR US GUIDE VASC ACCESS LEFT: IMG2389

## 2016-06-12 LAB — GLUCOSE, CAPILLARY
GLUCOSE-CAPILLARY: 107 mg/dL — AB (ref 65–99)
GLUCOSE-CAPILLARY: 68 mg/dL (ref 65–99)
GLUCOSE-CAPILLARY: 116 mg/dL — AB (ref 65–99)
GLUCOSE-CAPILLARY: 74 mg/dL (ref 65–99)
GLUCOSE-CAPILLARY: 83 mg/dL (ref 65–99)

## 2016-06-12 LAB — RENAL FUNCTION PANEL
Albumin: 1.9 g/dL — ABNORMAL LOW (ref 3.5–5.0)
Anion gap: 7 (ref 5–15)
BUN: 69 mg/dL — ABNORMAL HIGH (ref 6–20)
CALCIUM: 8 mg/dL — AB (ref 8.9–10.3)
CO2: 28 mmol/L (ref 22–32)
CREATININE: 3.95 mg/dL — AB (ref 0.61–1.24)
Chloride: 96 mmol/L — ABNORMAL LOW (ref 101–111)
GFR calc non Af Amer: 14 mL/min — ABNORMAL LOW (ref >60)
GFR, EST AFRICAN AMERICAN: 16 mL/min — AB (ref >60)
Glucose, Bld: 105 mg/dL — ABNORMAL HIGH (ref 65–99)
Phosphorus: 6.7 mg/dL — ABNORMAL HIGH (ref 2.5–4.6)
Potassium: 4.3 mmol/L (ref 3.5–5.1)
Sodium: 131 mmol/L — ABNORMAL LOW (ref 135–145)

## 2016-06-12 LAB — CBC
HEMATOCRIT: 26.1 % — AB (ref 39.0–52.0)
HEMOGLOBIN: 8.1 g/dL — AB (ref 13.0–17.0)
MCH: 34.3 pg — ABNORMAL HIGH (ref 26.0–34.0)
MCHC: 31 g/dL (ref 30.0–36.0)
MCV: 110.6 fL — ABNORMAL HIGH (ref 78.0–100.0)
Platelets: 103 10*3/uL — ABNORMAL LOW (ref 150–400)
RBC: 2.36 MIL/uL — ABNORMAL LOW (ref 4.22–5.81)
WBC: 4.7 10*3/uL (ref 4.0–10.5)

## 2016-06-12 LAB — PROTIME-INR
INR: 1.27
Prothrombin Time: 16 seconds — ABNORMAL HIGH (ref 11.4–15.2)

## 2016-06-12 LAB — APTT: APTT: 35 s (ref 24–36)

## 2016-06-12 MED ORDER — LIDOCAINE HCL (PF) 1 % IJ SOLN
INTRAMUSCULAR | Status: AC | PRN
  Administered 2016-06-12: 30 mL

## 2016-06-12 MED ORDER — CEFAZOLIN SODIUM-DEXTROSE 2-4 GM/100ML-% IV SOLN
INTRAVENOUS | Status: AC
  Filled 2016-06-12: qty 100

## 2016-06-12 MED ORDER — MIDAZOLAM HCL 2 MG/2ML IJ SOLN
INTRAMUSCULAR | Status: AC | PRN
  Administered 2016-06-12: 1 mg via INTRAVENOUS

## 2016-06-12 MED ORDER — ANTICOAGULANT SODIUM CITRATE 4% (200MG/5ML) IV SOLN
10.0000 mL | Freq: Once | Status: DC
  Filled 2016-06-12: qty 250

## 2016-06-12 MED ORDER — IPRATROPIUM-ALBUTEROL 0.5-2.5 (3) MG/3ML IN SOLN
3.0000 mL | Freq: Three times a day (TID) | RESPIRATORY_TRACT | Status: DC
  Administered 2016-06-12 – 2016-06-17 (×13): 3 mL via RESPIRATORY_TRACT
  Filled 2016-06-12 (×13): qty 3

## 2016-06-12 MED ORDER — DARBEPOETIN ALFA 200 MCG/0.4ML IJ SOSY
200.0000 ug | PREFILLED_SYRINGE | INTRAMUSCULAR | Status: DC
Start: 2016-06-12 — End: 2016-06-13

## 2016-06-12 MED ORDER — MIDODRINE HCL 5 MG PO TABS
15.0000 mg | ORAL_TABLET | Freq: Three times a day (TID) | ORAL | Status: DC
  Administered 2016-06-12 – 2016-06-20 (×23): 15 mg
  Filled 2016-06-12 (×23): qty 3

## 2016-06-12 MED ORDER — ANTICOAGULANT SODIUM CITRATE 4% (200MG/5ML) IV SOLN
5.0000 mL | Freq: Once | Status: AC
  Administered 2016-06-12: 5 mL via INTRAVENOUS
  Filled 2016-06-12: qty 250

## 2016-06-12 MED ORDER — LIDOCAINE HCL (PF) 1 % IJ SOLN: 5.0000 mL | INTRAMUSCULAR | Status: DC | PRN

## 2016-06-12 MED ORDER — HEPARIN SODIUM (PORCINE) 1000 UNIT/ML IJ SOLN
INTRAMUSCULAR | Status: AC
  Filled 2016-06-12: qty 1

## 2016-06-12 MED ORDER — CEFAZOLIN SODIUM-DEXTROSE 2-4 GM/100ML-% IV SOLN
INTRAVENOUS | Status: AC
  Administered 2016-06-12: 15:00:00
  Filled 2016-06-12: qty 100

## 2016-06-12 MED ORDER — ANTICOAGULANT SODIUM CITRATE 4% (200MG/5ML) IV SOLN
Status: AC | PRN
  Administered 2016-06-12: 10 mL via INTRAVENOUS

## 2016-06-12 MED ORDER — SODIUM CHLORIDE 0.9 % IV SOLN: 100.0000 mL | INTRAVENOUS | Status: DC | PRN

## 2016-06-12 MED ORDER — DEXTROSE 50 % IV SOLN
INTRAVENOUS | Status: AC
  Administered 2016-06-12: 50 mL
  Filled 2016-06-12: qty 50

## 2016-06-12 MED ORDER — LIDOCAINE-PRILOCAINE 2.5-2.5 % EX CREA: 1.0000 "application " | TOPICAL_CREAM | CUTANEOUS | Status: DC | PRN

## 2016-06-12 MED ORDER — FENTANYL CITRATE (PF) 100 MCG/2ML IJ SOLN
INTRAMUSCULAR | Status: AC
  Filled 2016-06-12: qty 2

## 2016-06-12 MED ORDER — PENTAFLUOROPROP-TETRAFLUOROETH EX AERO: 1.0000 "application " | INHALATION_SPRAY | CUTANEOUS | Status: DC | PRN

## 2016-06-12 MED ORDER — LIDOCAINE HCL (PF) 1 % IJ SOLN
INTRAMUSCULAR | Status: AC
  Filled 2016-06-12: qty 30

## 2016-06-12 MED ORDER — MIDAZOLAM HCL 2 MG/2ML IJ SOLN
INTRAMUSCULAR | Status: AC
  Filled 2016-06-12: qty 2

## 2016-06-12 MED ORDER — FENTANYL CITRATE (PF) 100 MCG/2ML IJ SOLN
INTRAMUSCULAR | Status: AC | PRN
  Administered 2016-06-12: 25 ug via INTRAVENOUS

## 2016-06-12 MED ORDER — CHLORHEXIDINE GLUCONATE 4 % EX LIQD
CUTANEOUS | Status: DC
Start: 2016-06-12 — End: 2016-06-12
  Filled 2016-06-12: qty 15

## 2016-06-12 MED ORDER — ALTEPLASE 2 MG IJ SOLR: 2.0000 mg | Freq: Once | INTRAMUSCULAR | Status: DC | PRN

## 2016-06-12 MED ORDER — HEPARIN SODIUM (PORCINE) 1000 UNIT/ML DIALYSIS
1000.0000 [IU] | INTRAMUSCULAR | Status: DC | PRN
  Filled 2016-06-12: qty 1

## 2016-06-12 NOTE — Consult Note (Signed)
Chief Complaint: Patient was seen in consultation today for tunneled hemodialysis catheter placement Chief Complaint  Patient presents with  . Altered Mental Status   at the request of tunneled dialysis catheter placement  Referring Physician(s): * No referring provider recorded for this case *  Supervising Physician: Jolaine Click  Patient Status: [facility] - In-pt  History of Present Illness: Omar [name] is a 71 y.o. male   Admitted 2016-05-22 with AMS; Hflu/ PNA Sepsis BC all neg 06/04/2016 Hypoxic; respiratory failure Suffered retroperitoneal bleed on heparin; shock and renal failure Trach placed after prolonged vent needed  Has right IJ temp dialysis catheter in place In dialysis now Request for tunneled catheter  Placement for long term use.    Past Medical History:  Diagnosis Date  . Asthma   . Atrial fibrillation (HCC)   . Gout   . Hypertension   . Morbid obesity (HCC)   . Multinodular goiter   . Osteoarthrosis and allied disorders   . Other and unspecified hyperlipidemia   . Stroke [facility])    right sided hemiparesis  . Thrombocytopenia (HCC)   . Vitamin D deficiency     Past Surgical History:  Procedure Laterality Date  . THYROID SURGERY    . TRACHEOSTOMY TUBE PLACEMENT N/A 05/07/2016   Procedure: TRACHEOSTOMY;  Surgeon: Christia Reading, MD;  Location: [facility] OR;  Service: ENT;  Laterality: N/A;    Allergies: Anacin af [acetaminophen]; Heparin; and Niaspan [niacin er]  Medications: Prior to Admission medications   Medication Sig Start Date End Date Taking? Authorizing Provider  albuterol (PROVENTIL HFA;VENTOLIN HFA) 108 (90 Base) MCG/ACT inhaler Inhale 2 puffs into the lungs every 6 (six) hours as needed for wheezing or shortness of breath. 12/09/15  Yes Ernestina Penna, MD  allopurinol (ZYLOPRIM) 300 MG tablet TAKE 1 TABLET DAILY 02/09/16  Yes Ernestina Penna, MD  amLODipine (NORVASC) 5 MG tablet TAKE 1.5 TABLETS (7.5 MG TOTAL) BY MOUTH DAILY. 03/22/16  Yes Ernestina Penna, MD  atorvastatin (LIPITOR) 20 MG tablet TAKE 1 TABLET (20 MG TOTAL) BY MOUTH DAILY AT 6 PM. 04/17/16  Yes Ernestina Penna, MD  fluticasone [facility]) 50 MCG/ACT nasal spray Place 1 spray into both nostrils 2 (two) times daily as needed for allergies or rhinitis. 04/14/16  Yes Elige Radon Dettinger, MD  furosemide (LASIX) 20 MG tablet Take 1 tablet (20 mg total) by mouth daily. 05/05/16  Yes Johna Sheriff, MD  gabapentin (NEURONTIN) 300 MG capsule TAKE 1 CAPSULE AT 8 AM 1 AT 6 PM AND 2 AT BEDTIME 04/17/16  Yes Ernestina Penna, MD  KLOR-CON M20 20 MEQ tablet TAKE 1 TABLET BY MOUTH EVERY DAY 04/17/16  Yes Ernestina Penna, MD  levothyroxine (SYNTHROID, LEVOTHROID) 125 MCG tablet TAKE 1 TABLET EVERY DAY 04/24/16  Yes Ernestina Penna, MD  losartan (COZAAR) 100 MG tablet TAKE 1 TABLET (100 MG TOTAL) BY MOUTH DAILY. 03/21/16  Yes Ernestina Penna, MD  metoprolol (LOPRESSOR) 50 MG tablet TAKE 1/2 TABLET 2 TIMES A DAY 03/21/16  Yes Ernestina Penna, MD  Vitamin D, Ergocalciferol, (DRISDOL) 50000 units CAPS capsule Take 1 capsule (50,000 Units total) by mouth every 7 (seven) days. 03/07/16  Yes Ernestina Penna, MD  warfarin (COUMADIN) 7.5 MG tablet TAKE 1 TABLET DAILY AT 6PM 04/17/16  Yes Ernestina Penna, MD     Family History  Problem Relation Age of Onset  . Coronary artery disease Brother     Social History  Social History  . Marital status: Widowed    Spouse name: N/A  . Number of children: N/A  . Years of education: N/A   Social History Main Topics  . Smoking status: Former Games developer  . Smokeless tobacco: Never Used     Comment: quit 12 + years ago   . Alcohol use No  . Drug use: No  . Sexual activity: No   Other Topics Concern  . None   Social History Narrative   Married, 1 child. Retired from job as Curator at [employer].     Review of Systems: A 12 point ROS discussed and pertinent positives are indicated in the HPI above.  All other systems are negative.  Review of Systems  Respiratory:  Positive for shortness of breath.   Neurological: Positive for weakness.    Vital Signs: BP 112/62   Pulse (!) 117   Temp 99.1 F (37.3 C) (Oral)   Resp (!) 24   Ht 5\' 6"  (1.676 m)   Wt (!) 340 lb (154.2 kg) Comment: I feel this weight may be inaccurate  SpO2 95%   BMI 54.88 kg/m   Physical Exam  Cardiovascular: Normal rate and regular rhythm.   Pulmonary/Chest: He has rales.  trach  Neurological: He is alert.  Skin: Skin is warm.  Psychiatric:  Able to nod yes no answers Appropriate Unable to answer name and dob  Consented with Dtr Aloha Gell via phone  Nursing note and vitals reviewed.   Mallampati Score:  MD Evaluation Airway: Other (comments) Airway comments: trach Heart: WNL Abdomen: WNL Chest/ Lungs: WNL ASA  Classification: 3 Mallampati/Airway Score: Three  Imaging: Ct Abdomen Pelvis Wo Contrast  Result Date: 05/20/2016 CLINICAL DATA:  Retroperitoneal bleeding. EXAM: CT ABDOMEN AND PELVIS WITHOUT CONTRAST TECHNIQUE: Multidetector CT imaging of the abdomen and pelvis was performed following the standard protocol without IV contrast. COMPARISON:  CT abdomen pelvis - 05/13/2016 FINDINGS: The lack of intravenous contrast limits the ability to evaluate solid abdominal organs. Lower chest: Evaluation of lower thorax is degraded secondary to patient respiratory artifact. Limited visualization of lower thorax demonstrates small bilateral effusions with associated bibasilar consolidative opacities and air bronchograms, right greater than left. Cardiomegaly.  No pericardial effusion. Hepatobiliary: Normal hepatic contour. Interval development of ill-defined hypoattenuating lesion within the medial aspect of the dome of the right lobe of the liver measuring approximately 5.0 x 3.3 x 4.9 cm (axial image 13, series 3, coronal image 86, series 4), incompletely evaluated on this noncontrast examination, though new compared to the 05/13/2016 examination and [place] worrisome for an area  of developing infection. Cholelithiasis without evidence of cholecystitis on this noncontrast examination. Trace amount of intra-abdominal ascites is noted about the caudal aspect the right lobe of the liver. Pancreas: Normal noncontrast appearance of the pancreas Spleen: Normal noncontrast appearance of the spleen Adrenals/Urinary Tract: Bilateral hypoattenuating renal lesions the largest right-sided lesion measuring approximately 2.7 cm in diameter (image 39, series 3), incompletely characterize though favored to represent renal cysts. No renal stones. No renal stones are seen along expected course of either ureter or the urinary bladder. The urinary bladder is decompressed with a Foley catheter. Normal noncontrast appearance of the bilateral adrenal glands. Stomach/Bowel: Moderate colonic stool burden without evidence of enteric obstruction. The bowel is normal in course and caliber without wall thickening. No pneumoperitoneum, pneumatosis or portal venous gas. Vascular/Lymphatic: Minimal amount of atherosclerotic plaque within a normal caliber abdominal aorta. The bilateral common iliac artery is are tortuous and mildly  aneurysmal with the right common iliac artery measuring 2.2 cm in diameter in the left common iliac artery measuring approximately 1.8 cm. Reproductive: Trace amount of free fluid in the pelvic cul-de-sac. Other: Re- demonstrated known large right-sided retroperitoneal hemorrhage. The hemorrhage again is noted to extends to the level of the right hemipelvis (image 72, series 3). The hemorrhage is noted to again displace the right kidney medially. Exact measurements are difficult secondary to the irregular shape of the retroperitoneal hemorrhage however dominant component of the hemorrhage appears grossly unchanged measuring approximately 18.4 x 25.6 cm (axial image 55, series 3; coronal image 73, series 8)), previously, 17.4 x 20.2 cm. Hyperattenuating blood products are again seen scattered  throughout scattered components of the complex retroperitoneal hemorrhage (representative images 37 and 54, series 3) likely indicative of areas of more recent hemorrhage. No definitive intra peritoneal extension. Large amount of body wall edema. Musculoskeletal: No acute or aggressive osseous abnormalities. 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 IMPRESSION: 1. Grossly unchanged size of large mixed attenuating right-sided retroperitoneal hemorrhage with dominant component measuring approximately 25.6 cm in diameter. No definitive intraperitoneal extension. 2. Interval development of ill-defined hypoattenuating 5 cm lesion within the medial aspect of the dome of the right lobe of the liver - while incompletely evaluated on this noncontrast examination, given rapid development since the 05/2008/2018 examination, this area is worrisome for a hepatic abscess or an intraparenchymal hepatic hemorrhage (unusual unless there is an underlying hepatic lesion which was not demonstrated on prior noncontrast examination). 3. Cholelithiasis without evidence cholecystitis on this noncontrast examination. 4. Similar findings of cardiomegaly, small bilateral effusions and associated bibasilar consolidative opacities worrisome for multifocal infection. 5. Extensive body wall edema. Critical Value/emergent results were called by telephone at the time of interpretation on 05/20/2016 at 11:32 am to Dr. Kalman Shan , who verbally acknowledged these results. Electronically Signed   By: Simonne Come M.D.   On: 05/20/2016 11:48   Ct Abdomen Pelvis Wo Contrast  Result Date: 05/13/2016 CLINICAL DATA:  Ct a/p wo abd / back pain on heparin gtt, with hgb drop to 6.7 EXAM: CT ABDOMEN AND PELVIS WITHOUT CONTRAST TECHNIQUE: Multidetector CT imaging of the abdomen and pelvis was performed following the standard protocol without IV  contrast. Image degradation secondary to body habitus. COMPARISON:  None available FINDINGS: Lower chest: Bilateral small pleural effusions. Patchy airspace consolidation/atelectasis posteriorly in the lung bases, right greater than left. Endotracheal tube terminates above the carina. No pneumothorax. Hepatobiliary: Unremarkable unenhanced scan of the liver. Hyperdense material in the gallbladder lumen possibly vicarious excretion of contrast material. Pancreas: Grossly unremarkable Spleen: Normal in size without focal abnormality. Adrenals/Urinary Tract: Normal adrenals. No hydronephrosis or urolithiasis. Foley catheter decompresses urinary bladder. Stomach/Bowel: Nasogastric tube into decompressed stomach. Small bowel is nondilated. Appendix not discretely identified. Colon is nondilated. Vascular/Lymphatic: Scattered aortoiliac arterial plaque without aneurysm. No definite adenopathy localized. Reproductive: Prostate is unremarkable. Other: There is extensive right retroperitoneal mixed attenuation fluid collection extending cephalad lateral to the liver to the level of the hemidiaphragm, and caudad into the pelvic extraperitoneal space medial to the acetabulum. The collection measures up to 17.4 cm transverse diameter, the dominant component extending over a craniocaudal length of approximately 26.3 cm. The collection results in mass effect upon the right kidney displaced medially. No definite ascites.  No free air. Musculoskeletal: No definite displaced fracture or worrisome bone lesion. IMPRESSION: 1. Large right retroperitoneal  hematoma as above. 2. Small bilateral pleural effusions with extensive consolidation/atelectasis posteriorly in both lung bases. Electronically Signed   By: Corlis Leak M.D.   On: 05/13/2016 15:24   Ct Abdomen Wo Contrast  Result Date: 06/06/2016 CLINICAL DATA:  Dysphagia, assess stomach anatomy for possible percutaneous gastrostomy placement. EXAM: CT ABDOMEN WITHOUT CONTRAST  TECHNIQUE: Multidetector CT imaging of the abdomen was performed following the standard protocol without IV contrast. COMPARISON:  05/20/2016 FINDINGS: Lower chest: Limited with motion artifact. Bibasilar atelectasis and mild consolidation, worse on the right with trace pleural effusions. Heart is mildly enlarged. No pericardial effusion. No hiatal hernia. Hepatobiliary: No significant focal hepatic abnormality. Atrophy noted of the left hepatic lobe chronically. No biliary obstruction. Gallstones layer within the mildly distended gallbladder. Common bile duct is nondilated. Pancreas: Limited assessment.  No pancreatic ductal dilatation. Spleen: Normal in size without focal abnormality. Adrenals/Urinary Tract: Normal adrenal glands. No renal obstruction or hydronephrosis. Stable probable hypodense bilateral renal cyst. Stomach/Bowel: Feeding tube is coiled within the stomach. Stomach is highly positioned within the epigastric region but beneath the left subcostal margin as before and just superior to the transverse colon. Because the high position, the overlying costal margin and the close proximity to the transverse colon percutaneous gastric access is high risk for complication. Considered surgical referral for gastrostomy insertion. Vascular/Lymphatic: Tortuosity atherosclerosis noted of the aortoiliac vessels. No gross adenopathy. Large heterogeneous right retroperitoneal hematoma again noted. No significant interval enlargement or change. Other: No new fluid collection or abscess. Negative for ascites. Diffuse body anasarca noted. Musculoskeletal: Degenerative changes noted of the spine. IMPRESSION: High positioned stomach beneath the left subcostal margin and close proximity to the transverse colon. Similar to the 05/20/2016 exam. See above comment. Recommend surgical referral for gastrostomy insertion. Bibasilar mild atelectasis/ consolidation and trace pleural effusions. Cholelithiasis Stable large right  retroperitoneal hematoma Aortoiliac atherosclerosis Electronically Signed   By: Judie Petit.  Shick M.D.   On: 06/06/2016 17:25   Dg Forearm Right  Result Date: 05/30/2016 CLINICAL DATA:  Right arm pain. EXAM: RIGHT FOREARM - 2 VIEW COMPARISON:  No recent. FINDINGS: No acute bony or joint abnormality identified. No evidence of fracture or dislocation. IMPRESSION: No acute or focal abnormality. Electronically Signed   By: Maisie Fus  Register   On: 05/30/2016 11:16   Ct Head Wo Contrast  Result Date: 06/04/2016 CLINICAL DATA:  Altered mental status EXAM: CT HEAD WITHOUT CONTRAST TECHNIQUE: Contiguous axial images were obtained from the base of the skull through the vertex without intravenous contrast. COMPARISON:  05/10/2016, 2016/05/21. FINDINGS: Brain: There is no intracranial hemorrhage, mass or evidence of acute infarction. There is moderate generalized atrophy. There is mild chronic microvascular ischemic change. There is no significant extra-axial fluid collection. No acute intracranial findings are evident. Vascular: No hyperdense vessel or unexpected calcification. Skull: Normal. Negative for fracture or focal lesion. Sinuses/Orbits: Paranasal sinuses are clear. Unchanged venous varices of the orbits. Other: None. IMPRESSION: No acute intracranial findings. There is moderate generalized atrophy and chronic appearing white matter hypodensities which likely represent small vessel ischemic disease. Electronically Signed   By: Ellery Plunk M.D.   On: 06/04/2016 22:29   Dg Chest Port 1 View  Result Date: 06/09/2016 CLINICAL DATA:  Hypoxia EXAM: PORTABLE CHEST 1 VIEW COMPARISON:  June 01, 2016 FINDINGS: Tracheostomy catheter tip is 6.4 cm above the carina. Feeding tube tip is below the diaphragm. Central catheter tip is in the superior vena cava. No pneumothorax. There is patchy bibasilar atelectasis. Lungs elsewhere are clear.  Heart is mildly enlarged with pulmonary venous hypertension. No adenopathy. No bone  lesions. There are surgical clips in the right lower neck region. IMPRESSION: Tube and catheter positions as described without evident pneumothorax. There is pulmonary vascular congestion without frank edema or consolidation. There is mild bibasilar atelectasis. Electronically Signed   By: Bretta Bang III M.D.   On: 06/09/2016 07:27   Dg Chest Port 1 View  Result Date: 06/01/2016 CLINICAL DATA:  Respiratory failure. EXAM: PORTABLE CHEST 1 VIEW COMPARISON:  05/29/2016 . FINDINGS: Surgical clips right neck. Tracheostomy tube, feeding tube, right IJ line stable position. Cardiomegaly with diffuse bilateral pulmonary infiltrates and small right pleural effusion again noted consistent with CHF. No significant change. Bilateral pneumonia cannot be excluded. Low lung volumes. IMPRESSION: 1.  Tracheostomy tube, feeding tube, right IJ line stable position. 2. Cardiomegaly with pulmonary venous congestion, persistent bilateral pulmonary infiltrates/ edema, and small right pleural effusion. Findings consistent with persistent CHF. No significant change. 3. Low lung volumes . Electronically Signed   By: Maisie Fus  Register   On: 06/01/2016 07:34   Dg Chest Port 1 View  Result Date: 05/29/2016 CLINICAL DATA:  Respiratory failure.  Shortness of breath. EXAM: PORTABLE CHEST 1 VIEW COMPARISON:  05/26/2016 . FINDINGS: Tracheostomy tube, feeding tube, bilateral IJ lines in stable position. Cardiomegaly with diffuse bilateral pulmonary infiltrates consistent pulmonary edema. Bilateral pleural effusions. Surgical clips right neck. IMPRESSION: 1. Lines and tubes in stable position. 2. Cardiomegaly with diffuse bilateral pulmonary infiltrates and bilateral pleural effusions consistent with CHF. Electronically Signed   By: Maisie Fus  Register   On: 05/29/2016 06:43   Dg Chest Port 1 View  Result Date: 05/26/2016 CLINICAL DATA:  Status post dialysis catheter placement EXAM: PORTABLE CHEST 1 VIEW COMPARISON:  05/25/2016  FINDINGS: Right and left jugular central venous catheters in unchanged position. Interval placement of a feeding tube coursing below the diaphragm. Tracheostomy tube in satisfactory position. Bilateral mild interstitial thickening. No pleural effusion or pneumothorax. Stable cardiomegaly. No acute osseous abnormality. IMPRESSION: 1. Support lines and tubing in unchanged position. 2. Nasogastric tube coursing below the diaphragm. 3. Cardiomegaly with mild pulmonary vascular congestion which has improved compared with the prior exam. Electronically Signed   By: Elige Ko   On: 05/26/2016 12:09   Dg Chest Port 1 View  Result Date: 05/25/2016 CLINICAL DATA:  Respiratory failure. EXAM: PORTABLE CHEST 1 VIEW COMPARISON:  05/23/2016. FINDINGS: Surgical clips right neck. Interim removal of endotracheal tube. Tracheostomy tube noted in good anatomic position. Interim removal of NG tube . Bilateral IJ lines in stable position. Cardiomegaly with diffuse bilateral pulmonary infiltrates consistent pulmonary edema again noted. No interim change. Small bilateral pleural effusions. No pneumothorax . IMPRESSION: 1. Interim removal of endotracheal tube. Tracheostomy tube noted in good anatomic position. Interim removal of NG tube. Bilateral IJ lines in stable position. 2. Cardiomegaly with diffuse bilateral pulmonary infiltrates consistent pulmonary edema. Bilateral pneumonia cannot be excluded. Small bilateral pleural effusions. Similar findings noted on prior exam . Electronically Signed   By: Maisie Fus  Register   On: 05/25/2016 07:06   Dg Chest Port 1 View  Result Date: 05/23/2016 CLINICAL DATA:  Acute respiratory failure. EXAM: PORTABLE CHEST 1 VIEW COMPARISON:  Radiograph of May 22, 2016. FINDINGS: Endotracheal and nasogastric tubes are unchanged in position. Stable position of bilateral jugular catheters. No pneumothorax is noted. Mildly increased perihilar and basilar opacities are noted concerning for worsening  edema or infiltrates. Bony thorax is unremarkable. IMPRESSION: Stable support apparatus. Mildly increased bilateral  lung opacities as described above. Electronically Signed   By: Lupita Raider, M.D.   On: 05/23/2016 07:41   Dg Chest Port 1 View  Result Date: 05/22/2016 CLINICAL DATA:  Intubation. EXAM: PORTABLE CHEST 1 VIEW COMPARISON:  05/21/2016. FINDINGS: Endotracheal tube, bilateral IJ lines, NG tube in stable position. Cardiomegaly with diffuse bilateral pulmonary infiltrates consistent pulmonary edema. Small bilateral pleural effusions. No pneumothorax . IMPRESSION: 1. Lines and tubes in stable position. 2. Cardiomegaly with diffuse bilateral pulmonary infiltrates and small pleural effusions consistent CHF . Electronically Signed   By: Maisie Fus  Register   On: 05/22/2016 06:49   Dg Chest Port 1 View  Result Date: 05/21/2016 CLINICAL DATA:  Retroperitoneal bleeding. EXAM: PORTABLE CHEST 1 VIEW COMPARISON:  Yesterday. FINDINGS: Endotracheal tube in satisfactory position. Bilateral jugular catheter tips at the origin of the superior vena cava. Nasogastric tube extending into the distal stomach. Stable enlarged cardiac silhouette. Decreased bilateral airspace opacity. Stable small right pleural effusion. IMPRESSION: Improving changes of congestive heart failure with stable cardiomegaly. Electronically Signed   By: Beckie Salts M.D.   On: 05/21/2016 07:23   Dg Chest Port 1 View  Result Date: 05/20/2016 CLINICAL DATA:  Endotracheal tube placement. EXAM: PORTABLE CHEST 1 VIEW COMPARISON:  05/19/2016 FINDINGS: Endotracheal tube has tip 3.8 cm above the carina. Left IJ central venous catheter and right IJ central venous catheters are unchanged. Enteric tube courses into the region of the stomach and off the inferior portion of the film as tip is not visualized. Lungs are adequately inflated with continued evidence of hazy bilateral perihilar and bibasilar opacification likely mild interstitial edema and  possible small bilateral pleural effusions. Stable cardiomegaly. Remainder of the exam is unchanged. IMPRESSION: Stable findings likely representing mild interstitial edema with small bilateral pleural effusions. Mild cardiomegaly. Tubes and lines as described. Electronically Signed   By: Elberta Fortis M.D.   On: 05/20/2016 07:10   Dg Chest Port 1 View  Result Date: 05/19/2016 CLINICAL DATA:  Intubation. EXAM: PORTABLE CHEST 1 VIEW COMPARISON:  05/18/2016. FINDINGS: Endotracheal tube, NG tube, bilateral IJ lines in stable position. Cardiomegaly with diffuse bilateral pulmonary infiltrates consistent pulmonary edema. Bilateral pleural effusions, right side greater than left again noted. Chest is unchanged from prior exam. Surgical clips right neck . IMPRESSION: 1. Lines and tubes in stable position. 2. Congestive heart failure bilateral pulmonary edema and bilateral pleural effusions. Exam unchanged from prior exam. Electronically Signed   By: Maisie Fus  Register   On: 05/19/2016 07:15   Dg Chest Port 1 View  Result Date: 05/18/2016 CLINICAL DATA:  Respiratory failure . EXAM: PORTABLE CHEST 1 VIEW COMPARISON:  05/17/2016 . FINDINGS: Endotracheal tube, NG tube, bilateral IJ lines in stable position. Cardiomegaly with pulmonary vascular congestion and bilateral interstitial prominence. Bilateral pleural effusions, right side greater left. Findings consistent with CHF. No interim improvement. No pneumothorax. Surgical clips in the right neck. IMPRESSION: 1. Lines and tubes in stable position. 2. Congestive heart failure with bilateral pulmonary edema and bilateral pleural effusions without significant improvement. Electronically Signed   By: Maisie Fus  Register   On: 05/18/2016 06:36   Dg Chest Port 1 View  Result Date: 05/17/2016 CLINICAL DATA:  Intubation. EXAM: PORTABLE CHEST 1 VIEW COMPARISON:  05/15/2016 . FINDINGS: Endotracheal tube, NG tube, left IJ line, right IJ line stable position. Cardiomegaly with  pulmonary vascular congestion and bilateral pulmonary infiltrates consistent pulmonary edema. Small bilateral pleural effusions. Similar findings noted on prior exam. No pneumothorax. Surgical clips right neck. IMPRESSION:  1. Lines and tubes in stable position. 2. Congestive heart failure bilateral pulmonary edema and bilateral pleural effusions, no change prior exam. Electronically Signed   By: Maisie Fus  Register   On: 05/17/2016 07:42   Dg Chest Port 1 View  Result Date: 05/15/2016 CLINICAL DATA:  72 y/o  M; central line placement. EXAM: PORTABLE CHEST 1 VIEW COMPARISON:  05/15/2016 CT of the chest. FINDINGS: Stable endotracheal tube, left central venous catheter, and enteric tube. Interval placement of a right central venous catheter with tip projecting over upper SVC. Surgical clips in region of right thyroid bed. Stable cardiac silhouette given projection and technique. Stable hazy opacifications of the lungs, left pleural effusion, and associated right basilar opacity. IMPRESSION: 1. Interval placement of right central venous catheter with tip projecting over upper SVC. Other lines and tubes are stable. 2. Stable right pleural effusion and bilateral airspace opacities. Electronically Signed   By: Mitzi Hansen M.D.   On: 05/15/2016 21:26   Dg Chest Port 1 View  Result Date: 05/15/2016 CLINICAL DATA:  Respiratory failure, ventilator dependent EXAM: PORTABLE CHEST 1 VIEW COMPARISON:  05/15/2016 at 4:46 a.m. FINDINGS: Endotracheal tube tip 2.5 cm above the carina. Left IJ line tip: SVC, with transverse orientation. Nasogastric tube extends into the stomach with some suggested in gaseous distention of the stomach. The patient is rotated to the right on today's radiograph, reducing diagnostic sensitivity and specificity. Mild cardiomegaly noted. Right pleural effusion with adjacent suspected atelectasis. Indistinct pulmonary vasculature compatible with pulmonary venous hypertension. Reduced  interstitial accentuation compared prior. IMPRESSION: 1. Cardiomegaly and pulmonary venous hypertension with reduced interstitial edema. 2. Stable moderate right pleural effusion with passive atelectasis. 3. Tubes and lines remain satisfactorily positioned. Electronically Signed   By: Gaylyn Rong M.D.   On: 05/15/2016 19:42   Dg Chest Port 1 View  Result Date: 05/15/2016 CLINICAL DATA:  Acute respiratory failure with hypoxia. EXAM: PORTABLE CHEST 1 VIEW COMPARISON:  05/14/2016. FINDINGS: Endotracheal tube in satisfactory position. Nasogastric tube extending into the stomach. Left jugular catheter tip in the proximal superior vena cava. Stable enlarged cardiac silhouette, bibasilar airspace opacity and right pleural effusion. Thoracic spine degenerative changes. IMPRESSION: Stable cardiomegaly, bilateral pulmonary edema and right pleural effusion. Electronically Signed   By: Beckie Salts M.D.   On: 05/15/2016 07:33   Dg Chest Port 1 View  Result Date: 05/14/2016 CLINICAL DATA:  Acute respiratory failure with hypoxia EXAM: PORTABLE CHEST 1 VIEW COMPARISON:  Chest radiograph from one day prior. FINDINGS: Endotracheal tube tip is 2.4 cm above the carina. Enteric tube enters stomach with the tip not seen on this image. Stable cardiomediastinal silhouette with mild cardiomegaly. No pneumothorax. Stable small right pleural effusion. Mild to moderate pulmonary edema appears slightly improved. Bibasilar lung opacities appear stable. Surgical clips overlie the medial lower right neck. IMPRESSION: 1. Well-positioned support structures.  No pneumothorax. 2. Stable small right pleural effusion. 3. Mild to moderate congestive heart failure, slightly improved. 4. Bibasilar lung opacities, stable, favor atelectasis. Electronically Signed   By: Delbert Phenix M.D.   On: 05/14/2016 07:58   Portable Chest Xray  Result Date: 05/13/2016 CLINICAL DATA:  ETT placement EXAM: PORTABLE CHEST 1 VIEW COMPARISON:  05/12/2016  FINDINGS: Endotracheal tube terminates 3 cm above the carina. Bilateral lower lungs are obscured by overlying soft tissue. At least a right pleural effusion is suspected. No pneumothorax. Cardiomegaly with mild interstitial edema. IMPRESSION: Endotracheal tube terminates 3 cm above the carina. Cardiomegaly with mild interstitial edema and suspected right  pleural effusion. Electronically Signed   By: Charline Bills M.D.   On: 05/13/2016 11:53   Dg Shoulder Right Port  Result Date: 06/05/2016 CLINICAL DATA:  Proximal humeral fracture. EXAM: PORTABLE RIGHT SHOULDER COMPARISON:  Chest x-ray 06/01/2016.  Humerus series 3 09/22/2016. FINDINGS: Fracture of the proximal right humerus again noted without interim change. Minimal displacement on single portable view. IMPRESSION: Stable appearing fracture of the proximal right humerus. Electronically Signed   By: Maisie Fus  Register   On: 06/05/2016 07:00   Dg Abd Portable 1v  Result Date: 05/25/2016 CLINICAL DATA:  Feeding tube placement EXAM: PORTABLE ABDOMEN - 1 VIEW COMPARISON:  None. FINDINGS: Feeding tube coils in the fundus of the stomach with the tip in the mid portion of the stomach. IMPRESSION: Feeding tube coils in the fundus of the stomach. Electronically Signed   By: Charlett Nose M.D.   On: 05/25/2016 10:45   Dg Humerus Right  Result Date: 05/30/2016 CLINICAL DATA:  Pain. EXAM: RIGHT HUMERUS - 2+ VIEW COMPARISON:  Chest x-ray 05/29/2016 . FINDINGS: Fracture is noted at the proximal right humeral diaphysis. Slight displacement is present. No other focal abnormality identified. IMPRESSION: Fractures of the proximal right humeral diaphysis. Slight displacement. Electronically Signed   By: Maisie Fus  Register   On: 05/30/2016 11:18   US Abdomen Limited Ruq  Result Date: 05/30/2016 CLINICAL DATA:  Request for evaluation of the liver abnormality seen on CT. EXAM: US ABDOMEN LIMITED - RIGHT UPPER QUADRANT COMPARISON:  CT 10 days ago. FINDINGS: Because of the  patient's body habitus, there is virtually no useful information. The technologist could not comparable E evaluate the liver parenchyma, gallbladder or ductal system because of very limited penetration. . IMPRESSION: Non useful ultrasound because of body habitus.  No useful data. Electronically Signed   By: Paulina Fusi M.D.   On: 05/30/2016 16:53    Labs:  CBC:  Recent Labs  06/09/16 0423 06/10/16 0420 06/11/16 0500 06/12/16 0417  WBC 5.7 5.1 4.1 4.7  HGB 7.4* 7.6* 7.8* 8.1*  HCT 24.1* 24.6* 25.7* 26.1*  PLT 77* 67* 78* 103*    COAGS:  Recent Labs  05/14/16 0439  05/18/16 0350  05/25/16 0430 06/01/16 1801 06/22/2016 0550 06/12/16 0417  INR 2.81  < > 1.52  < > 1.45 1.32 1.33 1.27  APTT 43*  --  36  --   --  32  --  35  < > = values in this interval not displayed.  BMP:  Recent Labs  06/09/16 0423 06/10/16 0500 06/11/16 0500 06/12/16 0417  NA 134* 134* 133*  133* 131*  K 4.3 3.6 4.0  4.0 4.3  CL 101 98* 96*  96* 96*  CO2 GLUCOSE 110* 109* 96  95 105*  BUN 81* 63* 44*  44* 69*  CALCIUM 8.1* 8.0* 7.9*  8.0* 8.0*  CREATININE 3.84* 3.09* 2.53*  2.52* 3.95*  GFRNONAA 14* 19* 24*  24* 14*  GFRAA 17* 22* 28*  28* 16*    LIVER FUNCTION TESTS:  Recent Labs  05/19/16 0330  05/20/16 0405  05/22/16 0430  05/23/16 0415  06/24/2016 0500 06/10/16 0500 06/11/16 0500 06/12/16 0417  BILITOT 5.2*  --  5.6*  --  6.2*  --  6.2*  --   --   --   --   --   AST 367*  --  163*  --  69*  --  56*  --   --   --   --   --  ALT 769*  --  515*  --  276*  --  225*  --   --   --   --   --   ALKPHOS 160*  --  141*  --  129*  --  121  --   --   --   --   --   PROT 6.7  --  6.7  --  7.0  --  7.1  --   --   --   --   --   ALBUMIN 2.6*  < > 2.5*  2.5*  < > 2.7*  2.7*  < > 2.8*  < > 1.7* 1.7* 1.7* 1.9*  < > = values in this interval not displayed.  TUMOR MARKERS: No results for input(s): AFPTM, CEA, CA199, CHROMGRNA in the last 8760 hours.  Assessment and  Plan:  Long hospital stay AMS; PNA; resp failure +trach RP bleed- shock- renal failure Now needs temp R IF exchanged to tunneled catheter for long term use/dialysis Risks and Benefits discussed with the patient's daughter including, but not limited to bleeding, infection, vascular injury, pneumothorax which may require chest tube placement, air embolism or even death All of her questions were answered, she is agreeable to proceed. Consent signed and in chart.   Thank you for this interesting consult.  I greatly enjoyed meeting JAXON FLATT and look forward to participating in their care.  A copy of this report was sent to the requesting provider on this date.  Electronically Signed: Zelena Bushong A 06/12/2016, 9:17 AM   I spent a total of 20 Minutes    in face to face in clinical consultation, greater than 50% of which was counseling/coordinating care for tunneled HD cath placement

## 2016-06-12 NOTE — Sedation Documentation (Signed)
Pt responds to verbal and tactile stimulatuion

## 2016-06-12 NOTE — Progress Notes (Signed)
PROGRESS NOTE  Omar [name]  IHK:[id number] DOB: Jul 19, 1945 DOA: 05/18/2016 PCP: Redge Gainer, MD  Brief Narrative:   71 y/o male from [place] admitted 3/5 with altered mental status + H.flu PNA with aspiration, sepsis, hypoxic/hypercapnic respiratory failure. Course c/b retroperitoneal bleed while on heparin with subsequent shock, acute renal failure & prolonged ventilation requiring tracheostomy.  On CRRT intermittently through hosp and has not tolerated HD due to hypotension. Palliative care has been consulted for goals of care discussion as pt has unreasonable expectations for recovery.    Antibiotics: Zosyn 3/06 >> 3/08 Rocephin (H.Flu PNA) 3/08 >> 3/14 Vancomycin 3/23 >> 3/28 Cefepime 3/23 >> 4/1 Meropenem 4/1 >> Vancomycin 4/3 >> 4/4  Cultures: Urine 3/05 >> negative Blood 3/06 >> negative Resp 3/6 >> H flu (b-lactamase +) Trach stoma site 3/23 >> diphtheroids Resp cx 3/28 >>nl flora Blood 4/1 NGTD  Lines/tubes: ETT 3/05 >> 3/8 ETT 3/10 >> 3/21 Trach (Dr Redmond Baseman) 3/21 >>  Lt IJ CVL 3/07 >> 3/27 R HD catheter  >> 3/22 R IJ HD catheter 3/23 >>   Events: 3/05  Transfer [facility] Sputum 3/06 >> Haemophilus influenzae (beta lactamase positive) Echo 3/06 >> EF 65 to 70%, mod LVH, mod TR, PAS 39 mmHg CT head 3/7 >> no acute abnormality  3/10 - Large retroperitoneal bleed noted on CT after c/o back pain/leg pain 3/27 rt humerus fracture identified - ortho consult 3/27 RUQ [place] .Marland Kitchen Not helpful due to body habitus. On ATC with copious secrtions.  3/29 - Chronic  critically ill 3/27 - end CRRT 3/30 - doing ATC x 24-48h per RN. Rt shoulder pain only when touched per RN. Making urine. Off CRRT. Not on pressors. Family not fully on board about LTAC per RN but looking at [place]. Needs LTAC.  4/2 - resumed CRRT 4/3 - Family agreeable to PEG tube 4/5 - G-tube to be placed by gen surg however anesthesia refused 2/2 hypotension.  4/6 - restarted HD. Tolerated.  4/7 - transferred to  SDU 4/8 - tunneled HD catheter by IR  Assessment & Plan:   Active Problems:   Paroxysmal atrial fibrillation (HCC)   Hemiparesis affecting right side as late effect of cerebrovascular accident [facility])   Hypothyroidism   Acute respiratory failure with hypoxemia (HCC)   Shock circulatory (HCC)   Altered mental status   Retroperitoneal hemorrhage   Goals of care, counseling/discussion   On enteral nutrition   Acute renal failure (ARF) (HCC)   Electrolyte imbalance   Shock liver   Current use of proton pump inhibitor   Contraindication to deep vein thrombosis (DVT) prophylaxis   Hepatic lesion   Palliative care encounter   Acute respiratory failure with hypercapnia (HCC)   AKI (acute kidney injury) ([place])   Stage 5 chronic kidney disease on chronic dialysis ([place])   HCAP (healthcare-associated pneumonia)   Palliative care by specialist   Tracheostomy care ([place])   Pressure injury of skin  Acute on chronic hypoxemic respiratory failure, improved.   Pneumonia, H influenza (beta lactamase positive), treated. Concern for new HCAP on 4/1 Pulm edema, improved Presumed OSA/OHS History of asthma Cont ATC, keep o2 sats > 88% Off vent since 4/2 duoneb qid + pulmicort BID Completed a course of meropenem on 4/8 for HCAP Trach change c/o ENT.   Septic and hemorrhagic shock, resolved. Off pressors 3/31 but still intermittently hypotensive Paroxysmal atrial fibrillation History of hypertension Off stress dose steroids, continue to hold anti-hypertensives. Increase midodrine to 82m TID(started on  4/5)  New ESRD, Acute renal failure, presumed due to ATN in the setting of hemorrhagic shock. Pt intermittently on CRRT and unable to tolerate HD 2/2 hypotension. Currently on HD since 4/6 HD today Midodrine increased Tunneled HD catheter placement today by IR  Large retroperitoneal bleed noted 3/10, some slight increased 3/18 CT scan. Hb has been stable.   Shock liver improving.  Hepatic  lesion; noted on CT scan of the abdomen 3/18. 5 cm lesion of the dome of the liver, could represent blood or infection or infarct.  Protein calorie malnutrition Continue PPI Continue tube feeds for now PEG/G tube unable to be placed.  IR unable to place 2/2 complicated anatomy. Gen surg unable to place  2/2 hypotension. Blood pressures still too low to ask general surgery for reevaluation  Acute blood loss anemia superimposed on anemia of chronic kidney disease, improved Severe heparin sensitivity, associated with hemorrhage Thrombocytopenia, acute on chronic Thrombocytopenia improving Follow CBC, Goal hemoglobin greater than 7.0.  No recent bleeding or transfusion.  No heparin for now.  Hold anticoagulation  SCD for DVT prophylaxis  H. influenzae pneumonia and HCAP treatment completed Cefepime, started 3/23, changed to [facility] 06/04/16 for possible HCAP.  Completed meropenem on 4/8.  Cultures (-) so far.   Hyperglycemia, on steroids, resolved Hypothyroidism, stable, continue synthroid  Metabolic uremic encephalopathy, improved.  History of CVA with right hemiplegia Rt humerus fracture  Mentation improving PT consult - LTAC recommended, family unsure.  RUE sling Morphine prn pain  FAMILY DISCUSSIONS:   Family OK with PEG tube as of 4/3 but unfortunately pt could not tolerate anesthesia. Need to address PEG issues this week.    Family is very hostile. Dr. Corrie Dandy spoke with granddaughter on 4/8 and updated her of pts condition that he was transferred to SDU.   Family requesting [facility] transfer and patient is on list if bed becomes available.  Otherwise, plan for LTAC when ready.    DVT prophylaxis:  SCDs Code Status:  full Family Communication:  Patient alone Disposition Plan:  Possibly to [facility] or LTAC but need more definitive feeding arrangements made   Consultants:   General surgery  Nephrology  ENT  Orthopedic surgery, Dr. Jacqulynn Cadet  Palliative  care  Interventional radiology   Antimicrobials:  Anti-infectives    Start     Dose/Rate Route Frequency Ordered Stop   06/06/2016 0900  meropenem (MERREM) 500 mg in sodium chloride 0.9 % 50 mL IVPB     500 mg 100 mL/hr over 30 Minutes Intravenous Every 24 hours 06/07/16 0955 06/11/16 1109   06/17/2016 0600  cefoTEtan (CEFOTAN) 2 g in dextrose 5 % 50 mL IVPB  Status:  Discontinued     2 g 100 mL/hr over 30 Minutes Intravenous To [facility] 06/07/16 1354 06/19/2016 1157   06/06/16 1200  vancomycin (VANCOCIN) 1,500 mg in sodium chloride 0.9 % 500 mL IVPB  Status:  Discontinued     1,500 mg 250 mL/hr over 120 Minutes Intravenous Every 24 hours 06/06/16 0818 06/07/16 0954   06/05/16 1000  meropenem (MERREM) 1 g in sodium chloride 0.9 % 100 mL IVPB  Status:  Discontinued     1 g 200 mL/hr over 30 Minutes Intravenous Every 12 hours 06/05/16 0926 06/07/16 0955   06/04/16 1400  vancomycin (VANCOCIN) 2,000 mg in sodium chloride 0.9 % 500 mL IVPB     2,000 mg 250 mL/hr over 120 Minutes Intravenous  Once 06/04/16 1234 06/04/16 1737   06/04/16  1400  meropenem (MERREM) 500 mg in sodium chloride 0.9 % 50 mL IVPB  Status:  Discontinued     500 mg 100 mL/hr over 30 Minutes Intravenous Every 24 hours 06/04/16 1234 06/05/16 0926   05/30/16 2300  ceFEPIme (MAXIPIME) 1 g in dextrose 5 % 50 mL IVPB  Status:  Discontinued     1 g 100 mL/hr over 30 Minutes Intravenous Every 24 hours 05/30/16 1223 06/04/16 1231   05/27/16 1400  ceFEPIme (MAXIPIME) 2 g in dextrose 5 % 50 mL IVPB  Status:  Discontinued     2 g 100 mL/hr over 30 Minutes Intravenous Every 12 hours 05/27/16 1014 05/30/16 1223   05/27/16 1400  vancomycin (VANCOCIN) 1,500 mg in sodium chloride 0.9 % 500 mL IVPB  Status:  Discontinued     1,500 mg 250 mL/hr over 120 Minutes Intravenous Every 24 hours 05/27/16 1014 05/30/16 1224   05/26/16 1300  ceFEPIme (MAXIPIME) 1 g in dextrose 5 % 50 mL IVPB  Status:  Discontinued     1 g 100 mL/hr over 30  Minutes Intravenous Every 24 hours 05/26/16 1225 05/27/16 1014   05/26/16 1230  vancomycin (VANCOCIN) 2,500 mg in sodium chloride 0.9 % 500 mL IVPB     2,500 mg 250 mL/hr over 120 Minutes Intravenous  Once 05/26/16 1225 05/26/16 1530   05/16/16 1400  cefTRIAXone (ROCEPHIN) 2 g in dextrose 5 % 50 mL IVPB     2 g 100 mL/hr over 30 Minutes Intravenous Every 24 hours 05/15/16 1649 05/17/16 1458   05/11/16 1400  cefTRIAXone (ROCEPHIN) 1 g in dextrose 5 % 50 mL IVPB  Status:  Discontinued     1 g 100 mL/hr over 30 Minutes Intravenous Every 24 hours 05/11/16 0833 05/15/16 1649   05/09/16 1400  piperacillin-tazobactam (ZOSYN) IVPB 3.375 g  Status:  Discontinued     3.375 g 12.5 mL/hr over 240 Minutes Intravenous Every 8 hours 05/09/16 1114 05/11/16 0833       Subjective:  Patient able to nod and shake his head. Denies pain, difficulty breathing, nausea.   Objective: Vitals:   06/12/16 1100 06/12/16 1130 06/12/16 1200 06/12/16 1227  BP: 101/62 (!) 86/55 (!) 97/51   Pulse: (!) 110 94 (!) 114 98  Resp: (!) 25 (!) 22 (!) 24 (!) 21  Temp:   99.1 F (37.3 C)   TempSrc:   Axillary   SpO2: 100% 100% 100%   Weight:      Height:        Intake/Output Summary (Last 24 hours) at 06/12/16 1259 Last data filed at 06/12/16 1200  Gross per 24 hour  Intake           678.67 ml  Output             1471 ml  Net          -792.33 ml   Filed Weights   06/05/16 0500 06/06/16 0358 06/07/16 0415  Weight: (!) 152.4 kg (336 lb) (!) 151.5 kg (334 lb) (!) 154.2 kg (340 lb)    Examination:  General exam:  Morbidly obese male, seen lying in bed in dialysis, trach collar in place with rhonchorous breath sounds heard from across the room, No acute distress.  HEENT:  MMM, trach collar in place Respiratory system: Rhonchorous bilateral breath sounds, no focal rales or wheezes Cardiovascular system: Regular rate and rhythm, normal S1/S2. No murmurs, rubs, gallops or clicks.  Warm extremities Gastrointestinal  system: Hyperactive bowel sounds,  soft, nondistended, nontender. MSK:  Decreased bulk and strength.  Right arm in sling.  2+ pitting bilateral lower extremity edema Neuro:  3 out of 5 strength of the right upper and right lower extremities, 4-5 strength left upper and lower extremities    Data Reviewed: I have personally reviewed following labs and imaging studies  CBC:  Recent Labs Lab 06/06/16 0420 06/07/16 0435 06/09/2016 0500 06/09/16 0423 06/10/16 0420 06/11/16 0500 06/12/16 0417  WBC 5.0 4.1 5.1 5.7 5.1 4.1 4.7  NEUTROABS 3.8 3.0 3.6  --   --   --   --   HGB 8.1* 7.7* 7.3* 7.4* 7.6* 7.8* 8.1*  HCT 25.2* 25.0* 23.3* 24.1* 24.6* 25.7* 26.1*  MCV 105.0* 108.7* 111.0* 110.6* 109.8* 110.8* 110.6*  PLT 50* 45* 52* 77* 67* 78* 383*   Basic Metabolic Panel:  Recent Labs Lab 06/06/16 0500  06/07/16 0435 06/07/16 1428 06/15/2016 0500 06/09/16 0423 06/10/16 0500 06/11/16 0500 06/12/16 0417  NA 134*  < > 136 135 134* 134* 134* 133*  133* 131*  K 4.1  < > 4.0 4.3 4.2 4.3 3.6 4.0  4.0 4.3  CL 101  < > 103 103 102 101 98* 96*  96* 96*  CO2 26  < > '28 27 26 26 27 29  29 28  ' GLUCOSE 112*  < > 114* 104* 91 110* 109* 96  95 105*  BUN 64*  < > 36* 41* 59* 81* 63* 44*  44* 69*  CREATININE 2.13*  < > 1.45* 1.83* 2.71* 3.84* 3.09* 2.53*  2.52* 3.95*  CALCIUM 8.1*  < > 7.9* 7.8* 8.0* 8.1* 8.0* 7.9*  8.0* 8.0*  MG 2.2  --  2.1  --  2.2  --   --   --   --   PHOS 3.3  < > 2.8 2.7 3.4  --  5.6* 4.5 6.7*  < > = values in this interval not displayed. GFR: Estimated Creatinine Clearance: 23.9 mL/min (A) (by C-G formula based on SCr of 3.95 mg/dL (H)). Liver Function Tests:  Recent Labs Lab 06/07/16 1428 06/20/2016 0500 06/10/16 0500 06/11/16 0500 06/12/16 0417  ALBUMIN 1.7* 1.7* 1.7* 1.7* 1.9*   No results for input(s): LIPASE, AMYLASE in the last 168 hours. No results for input(s): AMMONIA in the last 168 hours. Coagulation Profile:  Recent Labs Lab 06/07/2016 0550  06/12/16 0417  INR 1.33 1.27   Cardiac Enzymes: No results for input(s): CKTOTAL, CKMB, CKMBINDEX, TROPONINI in the last 168 hours. BNP (last 3 results) No results for input(s): PROBNP in the last 8760 hours. HbA1C: No results for input(s): HGBA1C in the last 72 hours. CBG:  Recent Labs Lab 06/11/16 1305 06/11/16 1701 06/11/16 1945 06/11/16 2308 06/12/16 0319  GLUCAP 115* 86 99 113* 107*   Lipid Profile: No results for input(s): CHOL, HDL, LDLCALC, TRIG, CHOLHDL, LDLDIRECT in the last 72 hours. Thyroid Function Tests: No results for input(s): TSH, T4TOTAL, FREET4, T3FREE, THYROIDAB in the last 72 hours. Anemia Panel: No results for input(s): VITAMINB12, FOLATE, FERRITIN, TIBC, IRON, RETICCTPCT in the last 72 hours. Urine analysis:    Component Value Date/Time   COLORURINE YELLOW 05/15/2016 1631   APPEARANCEUR HAZY (A) 05/15/2016 1631   LABSPEC 1.020 05/15/2016 1631   PHURINE 5.0 05/15/2016 1631   GLUCOSEU 100 (A) 05/15/2016 1631   HGBUR LARGE (A) 05/15/2016 1631   BILIRUBINUR NEGATIVE 05/15/2016 1631   BILIRUBINUR small 10/06/2014 1612   KETONESUR 15 (A) 05/15/2016 1631   PROTEINUR 100 (A)  05/15/2016 1631   UROBILINOGEN 2.0 10/06/2014 1612   UROBILINOGEN 4.0 (H) 05/30/2010 1511   NITRITE NEGATIVE 05/15/2016 1631   LEUKOCYTESUR SMALL (A) 05/15/2016 1631   Sepsis Labs: '@LABRCNTIP' (procalcitonin:4,lacticidven:4)  ) Recent Results (from the past 240 hour(s))  Culture, blood (Routine X 2) w Reflex to ID Panel     Status: None   Collection Time: 06/04/16 12:45 PM  Result Value Ref Range Status   Specimen Description BLOOD LEFT ARM  Final   Special Requests IN PEDIATRIC BOTTLE Blood Culture adequate volume  Final   Culture NO GROWTH 5 DAYS  Final   Report Status 06/09/2016 FINAL  Final  Culture, blood (Routine X 2) w Reflex to ID Panel     Status: None   Collection Time: 06/04/16 12:45 PM  Result Value Ref Range Status   Specimen Description BLOOD LEFT HAND  Final     Special Requests   Final    BOTTLES DRAWN AEROBIC ONLY Blood Culture adequate volume   Culture NO GROWTH 5 DAYS  Final   Report Status 06/09/2016 FINAL  Final  Culture, respiratory (NON-Expectorated)     Status: None   Collection Time: 06/04/16  4:22 PM  Result Value Ref Range Status   Specimen Description TRACHEAL ASPIRATE  Final   Special Requests NONE  Final   Gram Stain   Final    ABUNDANT WBC PRESENT, PREDOMINANTLY PMN FEW GRAM NEGATIVE RODS FEW GRAM POSITIVE COCCI IN CLUSTERS FEW GRAM POSITIVE COCCI IN PAIRS    Culture Consistent with normal respiratory flora.  Final   Report Status 06/07/2016 FINAL  Final  Culture, Urine     Status: None   Collection Time: 06/04/16  8:57 PM  Result Value Ref Range Status   Specimen Description URINE, RANDOM  Final   Special Requests NONE  Final   Culture NO GROWTH  Final   Report Status 06/06/2016 FINAL  Final  MRSA PCR Screening     Status: Abnormal   Collection Time: 06/11/16  4:18 AM  Result Value Ref Range Status   MRSA by PCR INVALID RESULTS, SPECIMEN SENT FOR CULTURE (A) NEGATIVE Final    Comment: NOTIFIED RN JENNIFER AT [phone] 06/11/16 BY A.DAVIS      Radiology Studies: No results found.   Scheduled Meds: . budesonide (PULMICORT) nebulizer solution  0.5 mg Nebulization BID  . chlorhexidine gluconate (MEDLINE KIT)  15 mL Mouth Rinse BID  . darbepoetin (ARANESP) injection - DIALYSIS  200 mcg Intravenous Q Mon-HD  . feeding supplement (OSMOLITE 1.5 CAL)  1,000 mL Per Tube Q24H  . feeding supplement (PRO-STAT SUGAR FREE 64)  60 mL Per Tube TID  . insulin aspart  0-9 Units Subcutaneous Q4H  . ipratropium-albuterol  3 mL Nebulization QID  . levothyroxine  125 mcg Per Tube QAC breakfast  . mouth rinse  15 mL Mouth Rinse QID  . midodrine  10 mg Per Tube TID WC  . multivitamin  1 tablet Oral QHS  . pantoprazole sodium  40 mg Per Tube Daily   Continuous Infusions: . sodium chloride 10 mL/hr at 06/03/16 2000  . sodium chloride  Stopped (06/06/16 0700)     LOS: 35 days    Time spent: 30 min    Janece Canterbury, MD [facility] Pager [phone]  If 7PM-7AM, please contact night-coverage [email or web address] Password [facility] 06/12/2016, 12:59 PM

## 2016-06-12 NOTE — Progress Notes (Signed)
RT called to dialysis bay 3 due to patient noted to have increased secretions even after RN had suctioned patient.  Suctioned patient and obtained a copious amount of thick, white, frothy secretions.  Patient stated he felt better.  Will continue to monitor.

## 2016-06-12 NOTE — Progress Notes (Signed)
SLP Cancellation Note  Patient Details Name: Omar [name] MRN: [id number] DOB: 10/14/1945   Cancelled treatment:       Reason Eval/Treat Not Completed: Patient at procedure or test/unavailable, in HD.    Thula Stewart, Riley Nearing 06/12/2016, 10:20 AM

## 2016-06-12 NOTE — Sedation Documentation (Signed)
MD aware of BP- instructions given

## 2016-06-12 NOTE — Procedures (Signed)
Tol HD today using temp HD cath.  For [facility] and IR asked to see. Obese man with trach, pleasant, edematous.  Cont aggressive dialysis with volume removal as tolerated but BP rate limiting. Jaicob Dia C

## 2016-06-12 NOTE — Care Management Important Message (Signed)
Important Message  Patient Details  Name: Omar [name] MRN: [id number] Date of Birth: [birth year]   Medicare Important Message Given:  Yes    [name] 06/12/2016, 4:47 PM

## 2016-06-12 NOTE — Sedation Documentation (Signed)
Patient is resting comfortably. 

## 2016-06-12 NOTE — Procedures (Signed)
LIJV HD catheter SVC RA EBL 0 COMP 0

## 2016-06-12 NOTE — Progress Notes (Signed)
Nutrition Follow-up  DOCUMENTATION CODES:   Morbid obesity  INTERVENTION:   Continue:  Osmolite 1.5 at 40 ml/h (960ml per day)  Pro-stat to 60 ml TID  Provides 2040kcal, 150gm protein, 732ml free water daily  NUTRITION DIAGNOSIS:   Inadequate oral intake related to inability to eat as evidenced by NPO status.  Ongoing  GOAL:   Provide needs based on ASPEN/SCCM guidelines  Met with TF  MONITOR:   Vent status, TF tolerance, I & O's, Labs  ASSESSMENT:   70 y/o male from [facility] admitted 3/5 with altered mental status with aspiration, sepsis, hypoxic/hypercapnic respiratory failure.    S/P tracheostomy 3/21. Currently on trach collar.  SLP following for PMV use. For tunneled HD catheter placement by IR today. PEG/G tube unable to be placed. IR unable to place due to complicated anatomy. General surgery unable to place due to hypotension.  Cortrak tube placed 3/22; tip in the stomach. Currently receiving Osmolite 1.5 at 40 ml/h (960ml per day) with Pro-stat 60 ml TID to provide 2040kcal, 150gm protein, 732ml free water daily. Fluid status now -5.1 L since admission Labs reviewed. Sodium 131 (L), potassium WNL, phosphorus 6.7 (H) CBG's: 107-68-116 Medications reviewed.  Diet Order:  Diet NPO time specified  Skin:  Reviewed, no issues  Last BM:  4/8 (rectal tube)  Height:   Ht Readings from Last 1 Encounters:  05/09/16 5' 6" (1.676 m)    Weight:   Wt Readings from Last 1 Encounters:  06/07/16 (!) 340 lb (154.2 kg)    Ideal Body Weight:  64.5 kg  BMI:  Body mass index is 54.88 kg/m.  Estimated Nutritional Needs:   Kcal:  2175  Protein:  150 gm  Fluid:  2.2-2.4 L  EDUCATION NEEDS:   No education needs identified at this time   , RD, LDN, CNSC Pager [phone] After Hours Pager [phone]  

## 2016-06-13 ENCOUNTER — Inpatient Hospital Stay (HOSPITAL_COMMUNITY): Payer: Medicare Other

## 2016-06-13 LAB — URINALYSIS, ROUTINE W REFLEX MICROSCOPIC
Bilirubin Urine: NEGATIVE
GLUCOSE, UA: NEGATIVE mg/dL
Ketones, ur: NEGATIVE mg/dL
NITRITE: NEGATIVE
PH: 6 (ref 5.0–8.0)
Protein, ur: 100 mg/dL — AB
SPECIFIC GRAVITY, URINE: 1.023 (ref 1.005–1.030)
Squamous Epithelial / LPF: NONE SEEN

## 2016-06-13 LAB — GLUCOSE, CAPILLARY
GLUCOSE-CAPILLARY: 106 mg/dL — AB (ref 65–99)
GLUCOSE-CAPILLARY: 108 mg/dL — AB (ref 65–99)
GLUCOSE-CAPILLARY: 85 mg/dL (ref 65–99)
GLUCOSE-CAPILLARY: 89 mg/dL (ref 65–99)
GLUCOSE-CAPILLARY: 99 mg/dL (ref 65–99)
Glucose-Capillary: 98 mg/dL (ref 65–99)

## 2016-06-13 LAB — RENAL FUNCTION PANEL
ALBUMIN: 1.7 g/dL — AB (ref 3.5–5.0)
Anion gap: 8 (ref 5–15)
BUN: 42 mg/dL — AB (ref 6–20)
CO2: 28 mmol/L (ref 22–32)
Calcium: 7.9 mg/dL — ABNORMAL LOW (ref 8.9–10.3)
Chloride: 96 mmol/L — ABNORMAL LOW (ref 101–111)
Creatinine, Ser: 3.16 mg/dL — ABNORMAL HIGH (ref 0.61–1.24)
GFR calc Af Amer: 21 mL/min — ABNORMAL LOW (ref >60)
GFR calc non Af Amer: 18 mL/min — ABNORMAL LOW (ref >60)
Glucose, Bld: 112 mg/dL — ABNORMAL HIGH (ref 65–99)
POTASSIUM: 3.7 mmol/L (ref 3.5–5.1)
Phosphorus: 5.1 mg/dL — ABNORMAL HIGH (ref 2.5–4.6)
SODIUM: 132 mmol/L — AB (ref 135–145)

## 2016-06-13 MED ORDER — DARBEPOETIN ALFA 200 MCG/0.4ML IJ SOSY: 200.0000 ug | PREFILLED_SYRINGE | INTRAMUSCULAR | Status: DC

## 2016-06-13 MED ORDER — SODIUM CHLORIDE 0.9 % IV SOLN
2000.0000 mg | Freq: Once | INTRAVENOUS | Status: AC
  Administered 2016-06-13: 2000 mg via INTRAVENOUS
  Filled 2016-06-13: qty 2000

## 2016-06-13 MED ORDER — VANCOMYCIN HCL IN DEXTROSE 1-5 GM/200ML-% IV SOLN
1000.0000 mg | INTRAVENOUS | Status: DC
  Administered 2016-06-14: 1000 mg via INTRAVENOUS
  Filled 2016-06-13 (×2): qty 200

## 2016-06-13 MED ORDER — RESOURCE THICKENUP CLEAR PO POWD
ORAL | Status: DC | PRN
  Filled 2016-06-13: qty 125

## 2016-06-13 MED ORDER — DARBEPOETIN ALFA 200 MCG/0.4ML IJ SOSY
200.0000 ug | PREFILLED_SYRINGE | Freq: Once | INTRAMUSCULAR | Status: AC
  Administered 2016-06-13: 200 ug via SUBCUTANEOUS
  Filled 2016-06-13: qty 0.4

## 2016-06-13 MED ORDER — CEFTAZIDIME 2 G IJ SOLR
2.0000 g | Freq: Once | INTRAMUSCULAR | Status: AC
  Administered 2016-06-13: 2 g via INTRAVENOUS
  Filled 2016-06-13: qty 2

## 2016-06-13 MED ORDER — DEXTROSE 5 % IV SOLN
2.0000 g | INTRAVENOUS | Status: DC
  Filled 2016-06-13: qty 2

## 2016-06-13 NOTE — Progress Notes (Signed)
Assessment/Plan: 1 AKI oliguric ATN.  Vol xs yet , with tenuous bp will need daily HD, .  Massive so needs longer HD.  Not clear will ever be candidate for outpatient HD.  S/P [facility] TDC 2 Resp failure with trach limits outpatient HD poss 3 Massive obesity 4 CVA 5 Arm fx 6 RP bleed 7 DM 8 Anemia esa, /Fe 9 low ptlt 10 afib 11 Nutrition needs PEG P HD Wed  Subjective: Interval History: s/p TDC by IR  Objective: Vital signs in last 24 hours: Temp:  [98.2 F (36.8 C)-101 F (38.3 C)] 98.8 F (37.1 C) (04/10 1139) Pulse Rate:  [70-117] 106 (04/10 1139) Resp:  [10-36] 36 (04/10 1139) BP: (66-110)/(38-58) 87/40 (04/10 1139) SpO2:  [95 %-100 %] 100 % (04/10 1139) FiO2 (%):  [28 %] 28 % (04/10 0850) Weight change:   Intake/Output from previous day: 04/09 0701 - 04/10 0700 In: 546.7 [NG/GT:546.7] Out: 1511 [Urine:40; Stool:150] Intake/Output this shift: No intake/output data recorded.  General appearance: alert and cooperative Neck: trtach Extremities: edema 2+/obese  Large abdomen  Lab Results:  Recent Labs  06/11/16 0500 06/12/16 0417  WBC 4.1 4.7  HGB 7.8* 8.1*  HCT 25.7* 26.1*  PLT 78* 103*   BMET:  Recent Labs  06/12/16 0417 06/13/16 0415  NA 131* 132*  K 4.3 3.7  CL 96* 96*  CO2 28 28  GLUCOSE 105* 112*  BUN 69* 42*  CREATININE 3.95* 3.16*  CALCIUM 8.0* 7.9*   No results for input(s): PTH in the last 72 hours. Iron Studies: No results for input(s): IRON, TIBC, TRANSFERRIN, FERRITIN in the last 72 hours. Studies/Results: Ir Fluoro Guide Cv Line Left  Result Date: 06/12/2016 CLINICAL DATA:  Dialysis catheter placement EXAM: CHEST FLUOROSCOPY COMPARISON:  06/09/2016 FINDINGS: Interval placement of left dialysis catheter with the tips in the right atrium. Right catheter tip remains in the SVC. Tracheostomy tube is unchanged. IMPRESSION: Left dialysis catheter placement with the tip in the right atrium. Other support devices are stable. Electronically  Signed   By: Rolm Baptise M.D.   On: 06/12/2016 15:43   Ir US Guide Vasc Access Left  Result Date: 06/12/2016 CLINICAL DATA:  Dialysis catheter placement EXAM: CHEST FLUOROSCOPY COMPARISON:  06/09/2016 FINDINGS: Interval placement of left dialysis catheter with the tips in the right atrium. Right catheter tip remains in the SVC. Tracheostomy tube is unchanged. IMPRESSION: Left dialysis catheter placement with the tip in the right atrium. Other support devices are stable. Electronically Signed   By: Rolm Baptise M.D.   On: 06/12/2016 15:43   Dg Chest Port 1 View  Result Date: 06/13/2016 CLINICAL DATA:  Fever. EXAM: PORTABLE CHEST 1 VIEW COMPARISON:  Radiograph of June 09, 2016. FINDINGS: Stable cardiomegaly. Tracheostomy tube is unchanged in position. Feeding tube is unchanged in position. Right internal jugular catheter is unchanged. Interval placement of left internal jugular dialysis catheter with distal tip in expected position of cavoatrial junction. No pneumothorax is is noted. Probable small right pleural effusion is noted. Mild bibasilar subsegmental atelectasis is noted. Bony thorax is unremarkable. IMPRESSION: Interval placement of left internal jugular dialysis catheter. Otherwise stable support apparatus. Probable small right pleural effusion. Mild bibasilar subsegmental atelectasis. Electronically Signed   By: Marijo Conception, M.D.   On: 06/13/2016 07:47    Scheduled: . anticoagulant sodium citrate  10 mL Intravenous Once  . budesonide (PULMICORT) nebulizer solution  0.5 mg Nebulization BID  . cefTAZidime (FORTAZ)  IV  2 g Intravenous Once  . [  START ON 06/14/2016] cefTAZidime (FORTAZ)  IV  2 g Intravenous Q M,W,F-HD  . chlorhexidine gluconate (MEDLINE KIT)  15 mL Mouth Rinse BID  . darbepoetin (ARANESP) injection - NON-DIALYSIS  200 mcg Subcutaneous Once  . [START ON 06/14/2016] darbepoetin (ARANESP) injection - DIALYSIS  200 mcg Intravenous Q Wed-HD  . feeding supplement (OSMOLITE 1.5  CAL)  1,000 mL Per Tube Q24H  . feeding supplement (PRO-STAT SUGAR FREE 64)  60 mL Per Tube TID  . insulin aspart  0-9 Units Subcutaneous Q4H  . ipratropium-albuterol  3 mL Nebulization TID  . levothyroxine  125 mcg Per Tube QAC breakfast  . mouth rinse  15 mL Mouth Rinse QID  . midodrine  15 mg Per Tube TID WC  . multivitamin  1 tablet Oral QHS  . pantoprazole sodium  40 mg Per Tube Daily  . vancomycin  2,000 mg Intravenous Once  . [START ON 06/14/2016] vancomycin  1,000 mg Intravenous Q M,W,F-HD     LOS: 36 days   , C 06/13/2016,2:13 PM  

## 2016-06-13 NOTE — Procedures (Signed)
Objective Swallowing Evaluation: Type of Study: FEES-Fiberoptic Endoscopic Evaluation of Swallow  Patient Details  Name: Omar [name] MRN: [id number] Date of Birth: [birth year]  Today's Date: 06/13/2016 Time: SLP Start Time (ACUTE ONLY): 1325-SLP Stop Time (ACUTE ONLY): 1415 SLP Time Calculation (min) (ACUTE ONLY): 50 min  Past Medical History:  Past Medical History:  Diagnosis Date  . Asthma   . Atrial fibrillation (HCC)   . Gout   . Hypertension   . Morbid obesity (HCC)   . Multinodular goiter   . Osteoarthrosis and allied disorders   . Other and unspecified hyperlipidemia   . Stroke [facility])    right sided hemiparesis  . Thrombocytopenia (HCC)   . Vitamin D deficiency    Past Surgical History:  Past Surgical History:  Procedure Laterality Date  . IR FLUORO GUIDE CV LINE LEFT  06/12/2016  . IR US GUIDE VASC ACCESS LEFT  06/12/2016  . THYROID SURGERY    . TRACHEOSTOMY TUBE PLACEMENT N/A 05/22/2016   Procedure: TRACHEOSTOMY;  Surgeon: Christia Reading, MD;  Location: [facility] OR;  Service: ENT;  Laterality: N/A;   HPI: 71 y/o male from [facility] admitted 3/5 with altered mental status with aspiration, sepsis, hypoxic/hypercapnic respiratory failure. Course c/b retroperitoneal bleed, shock, acute renal failure & prolonged ventilation requiring tracheostomy.  ETT 3/05-3/08; 3/10 - trach 3/21. CRRT.  Subjective: alert, confused   Assessment / Plan / Recommendation  CHL IP CLINICAL IMPRESSIONS 06/13/2016  Clinical Impression Pt did not tolerate FEES through  completion; was pulling at FEES scope, NG tube and refusing to drink more. However, SLP was able to assess thikened liquids and soft solids. Pt observed to have slow oral transit and oral resiudlas that spilled to vallecuale post swallow. Pharyngeal delay also noted prior to swallow initaition, though no penetration or aspiration was viewed prior to the swallow. One instance of silent penetration during the swallow with nectar teaspoon questionable  (?green on anterior commisure?). Visualization often difficult due to blurring on the scope. No significant weakness noted with pharyngeal musculature and residuals seem primarily due to spill from oral residue. Recommend pt initiate conservative diet of dys 1 (puree) and honey thick liquids with a second swallow. Pt must wear PMSV for PO intake. Recommend NG remain in place until pt demonstrates adequate oral intake of PO and meds. Will plan for upgraded trials at bedside and possible repeat FEES depending on pt progress.   SLP Visit Diagnosis Dysphagia, oral phase (R13.11)  Attention and concentration deficit following --  Frontal lobe and executive function deficit following --  Impact on safety and function Moderate aspiration risk;Risk for inadequate nutrition/hydration      CHL IP TREATMENT RECOMMENDATION 06/13/2016  Treatment Recommendations Therapy as outlined in treatment plan below     Prognosis 06/13/2016  Prognosis for Safe Diet Advancement Good  Barriers to Reach Goals --  Barriers/Prognosis Comment --    CHL IP DIET RECOMMENDATION 06/13/2016  SLP Diet Recommendations Dysphagia 1 (Puree) solids;Honey thick liquids  Liquid Administration via Cup;Spoon  Medication Administration Crushed with puree  Compensations Slow rate;Small sips/bites;Multiple dry swallows after each bite/sip  Postural Changes Remain semi-upright after after feeds/meals (Comment);Seated upright at 90 degrees      CHL IP OTHER RECOMMENDATIONS 06/13/2016  Recommended Consults --  Oral Care Recommendations Oral care BID  Other Recommendations Order thickener from pharmacy      CHL IP FOLLOW UP RECOMMENDATIONS 06/13/2016  Follow up Recommendations LTACH      CHL IP FREQUENCY AND DURATION 06/13/2016  Speech Therapy Frequency (ACUTE ONLY) min 2x/week  Treatment Duration 2 weeks           CHL IP ORAL PHASE 06/13/2016  Oral Phase Impaired  Oral - Pudding Teaspoon --  Oral - Pudding Cup --  Oral - Honey  Teaspoon Lingual/palatal residue;Decreased bolus cohesion;Delayed oral transit  Oral - Honey Cup --  Oral - Nectar Teaspoon Lingual/palatal residue;Decreased bolus cohesion;Delayed oral transit;Premature spillage  Oral - Nectar Cup --  Oral - Nectar Straw Delayed oral transit;Premature spillage  Oral - Thin Teaspoon --  Oral - Thin Cup --  Oral - Thin Straw --  Oral - Puree Delayed oral transit;Decreased bolus cohesion;Lingual/palatal residue  Oral - Mech Soft Decreased bolus cohesion;Delayed oral transit  Oral - Regular --  Oral - Multi-Consistency --  Oral - Pill --  Oral Phase - Comment --    CHL IP PHARYNGEAL PHASE 06/13/2016  Pharyngeal Phase Impaired  Pharyngeal- Pudding Teaspoon --  Pharyngeal --  Pharyngeal- Pudding Cup --  Pharyngeal --  Pharyngeal- Honey Teaspoon Delayed swallow initiation-vallecula  Pharyngeal --  Pharyngeal- Honey Cup Delayed swallow initiation-vallecula  Pharyngeal --  Pharyngeal- Nectar Teaspoon Delayed swallow initiation-pyriform sinuses;Delayed swallow initiation-vallecula;Penetration/Aspiration during swallow;Pharyngeal residue - valleculae  Pharyngeal --  Pharyngeal- Nectar Cup --  Pharyngeal --  Pharyngeal- Nectar Straw Delayed swallow initiation-vallecula  Pharyngeal --  Pharyngeal- Thin Teaspoon --  Pharyngeal --  Pharyngeal- Thin Cup --  Pharyngeal --  Pharyngeal- Thin Straw --  Pharyngeal --  Pharyngeal- Puree Delayed swallow initiation-vallecula;Pharyngeal residue - valleculae  Pharyngeal --  Pharyngeal- Mechanical Soft --  Pharyngeal --  Pharyngeal- Regular --  Pharyngeal --  Pharyngeal- Multi-consistency --  Pharyngeal --  Pharyngeal- Pill --  Pharyngeal --  Pharyngeal Comment --     No flowsheet data found.  No flowsheet data found. Harlon Ditty, MA CCC-SLP [phone]  Claudine Mouton 06/13/2016, 3:20 PM

## 2016-06-13 NOTE — Progress Notes (Signed)
PROGRESS NOTE  Omar [name]  PPJ:[id number] DOB: Mar 08, 1945 DOA: 05/31/2016 PCP: Redge Gainer, MD  Brief Narrative:   71 y/o male from [place] admitted 3/5 with altered mental status + H.flu PNA with aspiration, sepsis, hypoxic/hypercapnic respiratory failure. Course c/b retroperitoneal bleed while on heparin with subsequent shock, acute renal failure & prolonged ventilation requiring tracheostomy.  On CRRT intermittently through hosp and has not tolerated HD due to hypotension.  He has had a tunneled HD catheter placed.  He continues to have significant hypotension preventing placement of permanent feeding tube.  Continuing to have SLP reevaluate.   Palliative care is following.  Needs surgical G-tube placement once blood pressures have improved enough to tolerate anesthesia, then discharge to LTAC.    Antibiotics: Zosyn 3/06 >> 3/08 Rocephin (H.Flu PNA) 3/08 >> 3/14 Vancomycin 3/23 >> 3/28 Cefepime 3/23 >> 4/1 Meropenem 4/1 >> Vancomycin 4/3 >> 4/4  Cultures: Urine 3/05 >> negative Blood 3/06 >> negative Resp 3/6 >> H flu (b-lactamase +) Trach stoma site 3/23 >> diphtheroids Resp cx 3/28 >>nl flora Blood 4/1 NGTD  Lines/tubes: ETT 3/05 >> 3/8 ETT 3/10 >> 3/21 Trach (Dr Redmond Baseman) 3/21 >>  Lt IJ CVL 3/07 >> 3/27 R HD catheter  >> 3/22 R IJ HD catheter 3/23 >>   Events: 3/05  Transfer to [facility] Sputum 3/06 >> Haemophilus influenzae (beta lactamase positive) Echo 3/06 >> EF 65 to 70%, mod LVH, mod TR, PAS 39 mmHg CT head 3/7 >> no acute abnormality  3/10 - Large retroperitoneal bleed noted on CT after c/o back pain/leg pain 3/27 rt humerus fracture identified - ortho consult 3/27 RUQ [place] .Marland Kitchen Not helpful due to body habitus. On ATC with copious secrtions.  3/29 - Chronic  critically ill 3/27 - end CRRT 3/30 - doing ATC x 24-48h per RN. Rt shoulder pain only when touched per RN. Making urine. Off CRRT. Not on pressors. Family not fully on board about LTAC per RN but looking at  [facility]. Needs LTAC.  4/2 - resumed CRRT 4/3 - Family agreeable to PEG tube 4/5 - G-tube to be placed by gen surg however anesthesia refused 2/2 hypotension.  4/6 - restarted HD. Tolerated.  4/7 - transferred to SDU 4/8 - tunneled HD catheter by IR 4/9 - recurrent sepsis/fevers 4/10 - vesicular/bullous rash on right chest  Assessment & Plan:    Active Problems:   Paroxysmal atrial fibrillation (HCC)   Hemiparesis affecting right side as late effect of cerebrovascular accident [facility])   Hypothyroidism   Acute respiratory failure with hypoxemia (HCC)   Shock circulatory (HCC)   Altered mental status   Retroperitoneal hemorrhage   Goals of care, counseling/discussion   On enteral nutrition   Acute renal failure (ARF) (HCC)   Electrolyte imbalance   Shock liver   Current use of proton pump inhibitor   Contraindication to deep vein thrombosis (DVT) prophylaxis   Hepatic lesion   Palliative care encounter   Acute respiratory failure with hypercapnia (HCC)   AKI (acute kidney injury) ([place])   Stage 5 chronic kidney disease on chronic dialysis ([place])   HCAP (healthcare-associated pneumonia)   Palliative care by specialist   Tracheostomy care ([place])   Pressure injury of skin  Acute on chronic hypoxemic respiratory failure, improved.   Pneumonia, H influenza (beta lactamase positive), treated New HCAP on 4/1, treated Pulm edema, improved Presumed OSA/OHS History of asthma Cont ATC, keep o2 sats > 88% Off vent since 4/2 duoneb qid + pulmicort BID Completed  a course of meropenem on 4/8 for HCAP Trach change c/o ENT.   Septic and hemorrhagic shock, resolved. Off pressors 3/31 but still intermittently hypotensive Paroxysmal atrial fibrillation History of hypertension Recurrent sepsis with tachycardic, hypotension, and fever on 4/9 Off stress dose steroids, continue to hold anti-hypertensives. Continue increased midodrine to 62m TID(started on 4/5) Blood cultures 4/10 CXR:  No  pneumonia Tracheal aspirate culture pending UA abnormal but in setting of ARF, may not represent infection Varicella zoster PCR on Right chest wall bullae and vesicles Airborne precautions Due to worsening hypotension, start vanc and ceftaz pending culture data  Vesicular/bullous rash on right chest, consider metal allergy from the sling he has been wearing.  DDx also include shingles.  T4 dermatome and has a large ulcerated area on his back in same dermatomal distribution -  Varicella PCR -  Cover metal ring with tape -  Airborne precautions  New ESRD, Acute renal failure, presumed due to ATN in the setting of hemorrhagic shock. Pt intermittently on CRRT and unable to tolerate HD 2/2 hypotension. Currently on HD since 4/6 HD MWF Midodrine increased Tunneled HD catheter placed on 4/9 by IR  Large retroperitoneal bleed noted 3/10, some slight increased 3/18 CT scan. Hb has been stable.   Shock liver improving.  Hepatic lesion; noted on CT scan of the abdomen 3/18. 5 cm lesion of the dome of the liver, could represent blood or infection or infarct.  Protein calorie malnutrition Continue PPI Continue tube feeds for now PEG/G tube unable to be placed.  IR unable to place 2/2 complicated anatomy. Gen surg unable to place 2/2 hypotension. Blood pressures still too low to ask general surgery for reevaluation LTAC can take him but he would have to come back to the ER for GJ placement  Acute blood loss anemia superimposed on anemia of chronic kidney disease, improved Severe heparin sensitivity, associated with hemorrhage Thrombocytopenia, acute on chronic Thrombocytopenia improving Follow CBC, Goal hemoglobin greater than 7.0.  No recent bleeding or transfusion.  No heparin for now.  Hold anticoagulation  SCD for DVT prophylaxis  H. influenzae pneumonia and HCAP treatment completed Cefepime, started 3/23, [facility] for possible [facility].  Completed meropenem on 4/8.    Cultures (-) so far.   Hyperglycemia, on steroids, resolved Hypothyroidism, stable, continue synthroid  Metabolic uremic encephalopathy, improved.  History of CVA with right hemiplegia Rt humerus fracture  Mentation improving PT consult - LTAC recommended, family unsure.  RUE sling Morphine prn pain  FAMILY DISCUSSIONS:   Family OK with PEG tube as of 4/3 but unfortunately pt could not tolerate anesthesia. Need to address PEG issues this week.    Family is very hostile. Dr. DCorrie Dandyspoke with granddaughter on 4/8 and updated her of pts condition that he was transferred to SDU.   [facility] and [facility] on [facility] if bed becomes available.  Otherwise, plan for LTAC when ready.    DVT prophylaxis:  SCDs Code Status:  full Family Communication:  Patient alone [facility]  [facility] but need more definitive feeding arrangements made   Consultants:   General surgery  Nephrology  ENT  Orthopedic surgery, Dr. JJacqulynn Cadet Palliative care  Interventional radiology   Antimicrobials:  Anti-infectives    Start     Dose/Rate Route Frequency Ordered Stop   06/12/16 1448  ceFAZolin (ANCEF) 2-4 GM/100ML-% IVPB  Status:  Discontinued    Comments:  LShaaron Adler  :  cabinet override      06/12/16 1448 06/12/16 1454   06/12/16 1432  ceFAZolin (ANCEF) 2-4 GM/100ML-% IVPB    Comments:  Laughlin, Amy   : cabinet override      06/12/16 1432 06/12/16 1436   06/10/2016 0900  meropenem (MERREM) 500 mg in sodium chloride 0.9 % 50 mL IVPB     500 mg 100 mL/hr over 30 Minutes Intravenous Every 24 hours 06/07/16 0955 06/11/16 1109   06/15/2016 0600  cefoTEtan (CEFOTAN) 2 g in dextrose 5 % 50 mL IVPB  Status:  Discontinued     2 g 100 mL/hr over 30 Minutes Intravenous To [facility] 06/07/16 1354 06/20/2016 1157   06/06/16 1200  vancomycin (VANCOCIN) 1,500 mg in sodium chloride 0.9 % 500 mL IVPB  Status:  Discontinued     1,500  mg 250 mL/hr over 120 Minutes Intravenous Every 24 hours 06/06/16 0818 06/07/16 0954   06/05/16 1000  meropenem (MERREM) 1 g in sodium chloride 0.9 % 100 mL IVPB  Status:  Discontinued     1 g 200 mL/hr over 30 Minutes Intravenous Every 12 hours 06/05/16 0926 06/07/16 0955   06/04/16 1400  vancomycin (VANCOCIN) 2,000 mg in sodium chloride 0.9 % 500 mL IVPB     2,000 mg 250 mL/hr over 120 Minutes Intravenous  Once 06/04/16 1234 06/04/16 1737   06/04/16 1400  meropenem (MERREM) 500 mg in sodium chloride 0.9 % 50 mL IVPB  Status:  Discontinued     500 mg 100 mL/hr over 30 Minutes Intravenous Every 24 hours 06/04/16 1234 06/05/16 0926   05/30/16 2300  ceFEPIme (MAXIPIME) 1 g in dextrose 5 % 50 mL IVPB  Status:  Discontinued     1 g 100 mL/hr over 30 Minutes Intravenous Every 24 hours 05/30/16 1223 06/04/16 1231   05/27/16 1400  ceFEPIme (MAXIPIME) 2 g in dextrose 5 % 50 mL IVPB  Status:  Discontinued     2 g 100 mL/hr over 30 Minutes Intravenous Every 12 hours 05/27/16 1014 05/30/16 1223   05/27/16 1400  vancomycin (VANCOCIN) 1,500 mg in sodium chloride 0.9 % 500 mL IVPB  Status:  Discontinued     1,500 mg 250 mL/hr over 120 Minutes Intravenous Every 24 hours 05/27/16 1014 05/30/16 1224   05/26/16 1300  ceFEPIme (MAXIPIME) 1 g in dextrose 5 % 50 mL IVPB  Status:  Discontinued     1 g 100 mL/hr over 30 Minutes Intravenous Every 24 hours 05/26/16 1225 05/27/16 1014   05/26/16 1230  vancomycin (VANCOCIN) 2,500 mg in sodium chloride 0.9 % 500 mL IVPB     2,500 mg 250 mL/hr over 120 Minutes Intravenous  Once 05/26/16 1225 05/26/16 1530   05/16/16 1400  cefTRIAXone (ROCEPHIN) 2 g in dextrose 5 % 50 mL IVPB     2 g 100 mL/hr over 30 Minutes Intravenous Every 24 hours 05/15/16 1649 05/17/16 1458   05/11/16 1400  cefTRIAXone (ROCEPHIN) 1 g in dextrose 5 % 50 mL IVPB  Status:  Discontinued     1 g 100 mL/hr over 30 Minutes Intravenous Every 24 hours 05/11/16 0833 05/15/16 1649   05/09/16 1400   piperacillin-tazobactam (ZOSYN) IVPB 3.375 g  Status:  Discontinued     3.375 g 12.5 mL/hr over 240 Minutes Intravenous Every 8 hours 05/09/16 1114 05/11/16 0833       Subjective:  Patient able to nod and shake his head. Denies pain, difficulty breathing, nausea.  Has had shingles before and  right chest rash does not burn like his previous shingles.  Has had increased sputum from trach over last 24 hours.  No diarrhea  Objective: Vitals:   06/13/16 0313 06/13/16 0334 06/13/16 0751 06/13/16 1139  BP:  (!) 100/55 (!) 100/49 (!) 87/40  Pulse: (!) 112 70 (!) 108 (!) 106  Resp:  18 (!) 24 (!) 36  Temp:  100.2 F (37.9 C) 98.4 F (36.9 C) 98.8 F (37.1 C)  TempSrc:  Axillary Oral Oral  SpO2:  98% 100% 100%  Weight:      Height:        Intake/Output Summary (Last 24 hours) at 06/13/16 1238 Last data filed at 06/13/16 0600  Gross per 24 hour  Intake           546.67 ml  Output              190 ml  Net           356.67 ml   Filed Weights   06/06/16 0358 06/07/16 0415  Weight: (!) 151.5 kg (334 lb) (!) 154.2 kg (340 lb)    Examination:  General exam:  Morbidly obese male, trach collar in place with rhonchorous breath sounds heard from across the room, No acute distress.  HEENT:  MMM, trach collar in place Respiratory system: Rhonchorous bilateral breath sounds, no focal rales or wheezes Cardiovascular system: Regular rate and rhythm, normal S1/S2. No murmurs, rubs, gallops or clicks.  Warm extremities Gastrointestinal system: Hyperactive bowel sounds, soft, nondistended, nontender. MSK:  Decreased bulk and strength.  Right arm in sling.  2+ pitting bilateral lower extremity edema Neuro:  3 out of 5 strength of the right upper and right lower extremities, 4-5 strength left upper and lower extremities Skin:  Bullous rash on right chest in T4 distribution.  Has a large ulcerated area on his back in same distribution.  No vesicles under right arm, however.  Base of lesions is not  particularly erythematous.   GU:  Urine in foley is dark brown.  Stools in rectal bag are soft yellow-brown but not watery.    Data Reviewed: I have personally reviewed following labs and imaging studies  CBC:  Recent Labs Lab 06/07/16 0435 06/06/2016 0500 06/09/16 0423 06/10/16 0420 06/11/16 0500 06/12/16 0417  WBC 4.1 5.1 5.7 5.1 4.1 4.7  NEUTROABS 3.0 3.6  --   --   --   --   HGB 7.7* 7.3* 7.4* 7.6* 7.8* 8.1*  HCT 25.0* 23.3* 24.1* 24.6* 25.7* 26.1*  MCV 108.7* 111.0* 110.6* 109.8* 110.8* 110.6*  PLT 45* 52* 77* 67* 78* 017*   Basic Metabolic Panel:  Recent Labs Lab 06/07/16 0435  06/07/2016 0500 06/09/16 0423 06/10/16 0500 06/11/16 0500 06/12/16 0417 06/13/16 0415  NA 136  < > 134* 134* 134* 133*  133* 131* 132*  K 4.0  < > 4.2 4.3 3.6 4.0  4.0 4.3 3.7  CL 103  < > 102 101 98* 96*  96* 96* 96*  CO2 28  < > _0 GLUCOSE 114*  < > 91 110* 109* 96  95 105* 112*  BUN 36*  < > 59* 81* 63* 44*  44* 69* 42*  CREATININE 1.45*  < > 2.71* 3.84* 3.09* 2.53*  2.52* 3.95* 3.16*  CALCIUM 7.9*  < > 8.0* 8.1* 8.0* 7.9*  8.0* 8.0* 7.9*  MG 2.1  --  2.2  --   --   --   --   --  PHOS 2.8  < > 3.4  --  5.6* 4.5 6.7* 5.1*  < > = values in this interval not displayed. GFR: Estimated Creatinine Clearance: 29.9 mL/min (A) (by C-G formula based on SCr of 3.16 mg/dL (H)). Liver Function Tests:  Recent Labs Lab 06/13/2016 0500 06/10/16 0500 06/11/16 0500 06/12/16 0417 06/13/16 0415  ALBUMIN 1.7* 1.7* 1.7* 1.9* 1.7*   No results for input(s): LIPASE, AMYLASE in the last 168 hours. No results for input(s): AMMONIA in the last 168 hours. Coagulation Profile:  Recent Labs Lab 06/27/2016 0550 06/12/16 0417  INR 1.33 1.27   Cardiac Enzymes: No results for input(s): CKTOTAL, CKMB, CKMBINDEX, TROPONINI in the last 168 hours. BNP (last 3 results) No results for input(s): PROBNP in the last 8760 hours. HbA1C: No results for input(s): HGBA1C in the last 72  hours. CBG:  Recent Labs Lab 06/12/16 2029 06/13/16 0016 06/13/16 0332 06/13/16 0755 06/13/16 1225  GLUCAP 83 89 106* 85 99   Lipid Profile: No results for input(s): CHOL, HDL, LDLCALC, TRIG, CHOLHDL, LDLDIRECT in the last 72 hours. Thyroid Function Tests: No results for input(s): TSH, T4TOTAL, FREET4, T3FREE, THYROIDAB in the last 72 hours. Anemia Panel: No results for input(s): VITAMINB12, FOLATE, FERRITIN, TIBC, IRON, RETICCTPCT in the last 72 hours. Urine analysis:    Component Value Date/Time   COLORURINE AMBER (A) 06/13/2016 0808   APPEARANCEUR CLOUDY (A) 06/13/2016 0808   LABSPEC 1.023 06/13/2016 0808   PHURINE 6.0 06/13/2016 0808   GLUCOSEU NEGATIVE 06/13/2016 0808   HGBUR LARGE (A) 06/13/2016 0808   BILIRUBINUR NEGATIVE 06/13/2016 0808   BILIRUBINUR small 10/06/2014 1612   KETONESUR NEGATIVE 06/13/2016 0808   PROTEINUR 100 (A) 06/13/2016 0808   UROBILINOGEN 2.0 10/06/2014 1612   UROBILINOGEN 4.0 (H) 05/30/2010 1511   NITRITE NEGATIVE 06/13/2016 0808   LEUKOCYTESUR MODERATE (A) 06/13/2016 0808   Sepsis Labs: _0 (procalcitonin:4,lacticidven:4)  ) Recent Results (from the past 240 hour(s))  Culture, blood (Routine X 2) w Reflex to ID Panel     Status: None   Collection Time: 06/04/16 12:45 PM  Result Value Ref Range Status   Specimen Description BLOOD LEFT ARM  Final   Special Requests IN PEDIATRIC BOTTLE Blood Culture adequate volume  Final   Culture NO GROWTH 5 DAYS  Final   Report Status 06/09/2016 FINAL  Final  Culture, blood (Routine X 2) w Reflex to ID Panel     Status: None   Collection Time: 06/04/16 12:45 PM  Result Value Ref Range Status   Specimen Description BLOOD LEFT HAND  Final   Special Requests   Final    BOTTLES DRAWN AEROBIC ONLY Blood Culture adequate volume   Culture NO GROWTH 5 DAYS  Final   Report Status 06/09/2016 FINAL  Final  Culture, respiratory (NON-Expectorated)     Status: None   Collection Time: 06/04/16  4:22 PM    Result Value Ref Range Status   Specimen Description TRACHEAL ASPIRATE  Final   Special Requests NONE  Final   Gram Stain   Final    ABUNDANT WBC PRESENT, PREDOMINANTLY PMN FEW GRAM NEGATIVE RODS FEW GRAM POSITIVE COCCI IN CLUSTERS FEW GRAM POSITIVE COCCI IN PAIRS    Culture Consistent with normal respiratory flora.  Final   Report Status 06/07/2016 FINAL  Final  Culture, Urine     Status: None   Collection Time: 06/04/16  8:57 PM  Result Value Ref Range Status   Specimen Description URINE, RANDOM  Final   Special Requests  NONE  Final   Culture NO GROWTH  Final   Report Status 06/06/2016 FINAL  Final  MRSA PCR Screening     Status: Abnormal   Collection Time: 06/11/16  4:18 AM  Result Value Ref Range Status   MRSA by PCR INVALID RESULTS, SPECIMEN SENT FOR CULTURE (A) NEGATIVE Final    Comment: NOTIFIED RN JENNIFER AT [phone] 06/11/16 BY A.DAVIS  MRSA culture     Status: None (Preliminary result)   Collection Time: 06/11/16  4:18 AM  Result Value Ref Range Status   Specimen Description NASAL SWAB  Final   Special Requests NONE  Final   Culture CULTURE REINCUBATED FOR BETTER GROWTH  Final   Report Status PENDING  Incomplete  Culture, respiratory (NON-Expectorated)     Status: None (Preliminary result)   Collection Time: 06/13/16  9:04 AM  Result Value Ref Range Status   Specimen Description TRACHEAL ASPIRATE  Final   Special Requests Normal  Final   Gram Stain   Final    MODERATE WBC PRESENT,BOTH PMN AND MONONUCLEAR FEW GRAM POSITIVE COCCI IN PAIRS IN CLUSTERS RARE GRAM NEGATIVE RODS    Culture PENDING  Incomplete   Report Status PENDING  Incomplete      Radiology Studies: Ir Fluoro Guide Cv Line Left  Result Date: 06/12/2016 CLINICAL DATA:  Dialysis catheter placement EXAM: CHEST FLUOROSCOPY COMPARISON:  06/09/2016 FINDINGS: Interval placement of left dialysis catheter with the tips in the right atrium. Right catheter tip remains in the SVC. Tracheostomy tube is unchanged.  IMPRESSION: Left dialysis catheter placement with the tip in the right atrium. Other support devices are stable. Electronically Signed   By: Charlett Nose M.D.   On: 06/12/2016 15:43   Ir US Guide Vasc Access Left  Result Date: 06/12/2016 CLINICAL DATA:  Dialysis catheter placement EXAM: CHEST FLUOROSCOPY COMPARISON:  06/09/2016 FINDINGS: Interval placement of left dialysis catheter with the tips in the right atrium. Right catheter tip remains in the SVC. Tracheostomy tube is unchanged. IMPRESSION: Left dialysis catheter placement with the tip in the right atrium. Other support devices are stable. Electronically Signed   By: Charlett Nose M.D.   On: 06/12/2016 15:43   Dg Chest Port 1 View  Result Date: 06/13/2016 CLINICAL DATA:  Fever. EXAM: PORTABLE CHEST 1 VIEW COMPARISON:  Radiograph of June 09, 2016. FINDINGS: Stable cardiomegaly. Tracheostomy tube is unchanged in position. Feeding tube is unchanged in position. Right internal jugular catheter is unchanged. Interval placement of left internal jugular dialysis catheter with distal tip in expected position of cavoatrial junction. No pneumothorax is is noted. Probable small right pleural effusion is noted. Mild bibasilar subsegmental atelectasis is noted. Bony thorax is unremarkable. IMPRESSION: Interval placement of left internal jugular dialysis catheter. Otherwise stable support apparatus. Probable small right pleural effusion. Mild bibasilar subsegmental atelectasis. Electronically Signed   By: Lupita Raider, M.D.   On: 06/13/2016 07:47     Scheduled Meds: . anticoagulant sodium citrate  10 mL Intravenous Once  . budesonide (PULMICORT) nebulizer solution  0.5 mg Nebulization BID  . chlorhexidine gluconate (MEDLINE KIT)  15 mL Mouth Rinse BID  . darbepoetin (ARANESP) injection - DIALYSIS  200 mcg Intravenous Q Mon-HD  . feeding supplement (OSMOLITE 1.5 CAL)  1,000 mL Per Tube Q24H  . feeding supplement (PRO-STAT SUGAR FREE 64)  60 mL Per Tube  TID  . insulin aspart  0-9 Units Subcutaneous Q4H  . ipratropium-albuterol  3 mL Nebulization TID  . levothyroxine  125 mcg  Per Tube QAC breakfast  . mouth rinse  15 mL Mouth Rinse QID  . midodrine  15 mg Per Tube TID WC  . multivitamin  1 tablet Oral QHS  . pantoprazole sodium  40 mg Per Tube Daily   Continuous Infusions: . sodium chloride 10 mL/hr at 06/03/16 2000  . sodium chloride Stopped (06/06/16 0700)     LOS: 36 days    Time spent: 30 min    Janece Canterbury, MD [facility] Pager [phone]  If 7PM-7AM, please contact night-coverage [email or web address] Password [facility] 06/13/2016, 12:38 PM

## 2016-06-13 NOTE — Progress Notes (Signed)
  Speech Language Pathology Treatment: Dysphagia;Passy Muir Speaking valve  Patient Details Name: Omar [name] MRN: [id number] DOB: [birth year] Today's Date: 06/13/2016 Time: 7829-5621 SLP Time Calculation (min) (ACUTE ONLY): 12 min  Assessment / Plan / Recommendation Clinical Impression  Pt seen for Po trials and trial with PMSV to determine readiness for objective testing today (FEES). Pt able to sustain arousal for 12 minutes, tolerated PMSV placement with no change in vital signs or back pressure with removal at end of session. Pt achieve hoarse breathy phonation at word level x3 with max question cues, but could not increase breath support for volume or expectoration of secretions despite cueing. Pt did eagerly accept ice chips, though swallow response appeared delayed. Will proceed with FEES this pm to determine if pt may take oral nutrition.   HPI HPI: 71 y/o male from [facility] admitted 3/5 with altered mental status with aspiration, sepsis, hypoxic/hypercapnic respiratory failure. Course c/b retroperitoneal bleed, shock, acute renal failure & prolonged ventilation requiring tracheostomy.  ETT 3/05-3/08; 3/10 - trach 3/21. CRRT.      SLP Plan   (FEES)       Recommendations  Diet recommendations: NPO      Patient may use Passy-Muir Speech Valve: with SLP only         Oral Care Recommendations: Oral care QID Follow up Recommendations: LTACH SLP Visit Diagnosis: Aphonia (R49.1) Plan:  (FEES)       GO                [name], Riley Nearing 06/13/2016, 2:51 PM

## 2016-06-13 NOTE — Progress Notes (Signed)
Pharmacy Antibiotic Note  Omar [name] is a 71 y.o. male admitted on 05/27/2016 with AMS. Now s/p multiple abx courses but has new fever and sepsis. Pharmacy has been consulted for vancomycin and ceftazidime dosing.  New ESRD, unsure if outpatient HD candidate. Has been off of vancomycin for almost a week with last level checked being 23 on 4/3 and then given one more vanc dose that day. Last HD session was on 4/9.  Plan: Give vancomycin 2g IV x 1, then start vancomycin 1g IV QHD-MWF Give ceftazidime 2g IV x 1, the start ceftazidime 2g IV QHD-MWF at 1800 Monitor clinical picture, HD sessions F/U C&S, abx deescalation / LOT   Height:  (167.6 cm) Weight:  (bedscale not working) IBW/kg (Calculated) : 63.8  Temp (24hrs), Avg:99.3 F (37.4 C), Min:98.2 F (36.8 C), Max:101 F (38.3 C)   Recent Labs Lab 06/26/2016 0500 06/09/16 0423 06/10/16 0420 06/10/16 0500 06/11/16 0500 06/12/16 0417 06/13/16 0415  WBC 5.1 5.7 5.1  --  4.1 4.7  --   CREATININE 2.71* 3.84*  --  3.09* 2.53*  2.52* 3.95* 3.16*    Estimated Creatinine Clearance: 29.9 mL/min (A) (by C-G formula based on SCr of 3.16 mg/dL (H)).    Allergies  Allergen Reactions  . Anacin Af [Acetaminophen]   . Heparin     Retroperitoneal bleed in 2018, previous bleed from heparin in 2013  . Niaspan [Niacin Er]     Antimicrobials this admission: Zosyn 3/6 >> 3/8 CTX 3/8 >> 3/14 Cefepime 3/23 >> 4/1 Vancomycin 3/23 >> 3/28, 4/1 > 4/4; 4/10 Meropenem 4/1 >> 4/8 Ceftazidime 4/10 >>  Dose adjustments this admission:  3/28 VR = 37   4/3 VR = 23  Microbiology results: 3/6 MRSA PCR >> negative 3/6 Resp >> H. Influenzae, beta lactamase positive 4/1 Blood >> ngtd **All other cultures negative, including 4/1 recheck Neg hep B 4/8 MRSA swab: pending 4/10 TA Cx: few GPC in pairs and clusters and rare GNRs 4/10 BCx: sent 4/10 UCx: sent  Thank you for allowing pharmacy to be a part of this patient's  care.  [name] 06/13/2016 12:42 PM

## 2016-06-14 DIAGNOSIS — E034 Atrophy of thyroid (acquired): Secondary | ICD-10-CM

## 2016-06-14 DIAGNOSIS — Z79899 Other long term (current) drug therapy: Secondary | ICD-10-CM

## 2016-06-14 LAB — GLUCOSE, CAPILLARY
GLUCOSE-CAPILLARY: 101 mg/dL — AB (ref 65–99)
GLUCOSE-CAPILLARY: 109 mg/dL — AB (ref 65–99)
GLUCOSE-CAPILLARY: 86 mg/dL (ref 65–99)
GLUCOSE-CAPILLARY: 98 mg/dL (ref 65–99)
GLUCOSE-CAPILLARY: 98 mg/dL (ref 65–99)
Glucose-Capillary: 112 mg/dL — ABNORMAL HIGH (ref 65–99)
Glucose-Capillary: 116 mg/dL — ABNORMAL HIGH (ref 65–99)

## 2016-06-14 LAB — CBC
HCT: 25 % — ABNORMAL LOW (ref 39.0–52.0)
Hemoglobin: 8 g/dL — ABNORMAL LOW (ref 13.0–17.0)
MCH: 35.6 pg — ABNORMAL HIGH (ref 26.0–34.0)
MCHC: 32 g/dL (ref 30.0–36.0)
MCV: 111.1 fL — ABNORMAL HIGH (ref 78.0–100.0)
PLATELETS: 130 10*3/uL — AB (ref 150–400)
RBC: 2.25 MIL/uL — ABNORMAL LOW (ref 4.22–5.81)
WBC: 4.4 10*3/uL (ref 4.0–10.5)

## 2016-06-14 LAB — RENAL FUNCTION PANEL
ALBUMIN: 1.7 g/dL — AB (ref 3.5–5.0)
ANION GAP: 7 (ref 5–15)
BUN: 70 mg/dL — AB (ref 6–20)
CO2: 28 mmol/L (ref 22–32)
CREATININE: 4.4 mg/dL — AB (ref 0.61–1.24)
Calcium: 7.9 mg/dL — ABNORMAL LOW (ref 8.9–10.3)
Chloride: 98 mmol/L — ABNORMAL LOW (ref 101–111)
GFR calc Af Amer: 14 mL/min — ABNORMAL LOW (ref >60)
GFR, EST NON AFRICAN AMERICAN: 12 mL/min — AB (ref >60)
GLUCOSE: 105 mg/dL — AB (ref 65–99)
PHOSPHORUS: 6.2 mg/dL — AB (ref 2.5–4.6)
POTASSIUM: 4 mmol/L (ref 3.5–5.1)
Sodium: 133 mmol/L — ABNORMAL LOW (ref 135–145)

## 2016-06-14 LAB — VARICELLA-ZOSTER BY PCR: Varicella-Zoster, PCR: POSITIVE — AB

## 2016-06-14 LAB — MRSA CULTURE: Culture: NOT DETECTED

## 2016-06-14 LAB — URINE CULTURE
Culture: >=100000 — AB
Special Requests: NORMAL

## 2016-06-14 LAB — VARICELLA ZOSTER ANTIBODY, IGM: Varicella-Zoster Ab, IgM: 1.38 index — ABNORMAL HIGH (ref 0.00–0.90)

## 2016-06-14 LAB — VARICELLA ZOSTER ANTIBODY, IGG: Varicella IgG: >4000 index (ref >165)

## 2016-06-14 MED ORDER — LIDOCAINE HCL (PF) 1 % IJ SOLN
5.0000 mL | INTRAMUSCULAR | Status: DC | PRN
Start: 2016-06-14 — End: 2016-06-20

## 2016-06-14 MED ORDER — ALTEPLASE 2 MG IJ SOLR
INTRAMUSCULAR | Status: AC
  Administered 2016-06-14: 2 mg
  Filled 2016-06-14: qty 2

## 2016-06-14 MED ORDER — SODIUM CHLORIDE 0.9 % IV SOLN
100.0000 mL | INTRAVENOUS | Status: DC | PRN
Start: 2016-06-14 — End: 2016-06-20

## 2016-06-14 MED ORDER — ALTEPLASE 2 MG IJ SOLR
2.0000 mg | Freq: Once | INTRAMUSCULAR | Status: AC
  Administered 2016-06-14: 2 mg

## 2016-06-14 MED ORDER — ACYCLOVIR 200 MG PO CAPS
200.0000 mg | ORAL_CAPSULE | Freq: Two times a day (BID) | ORAL | Status: DC
  Administered 2016-06-15: 200 mg via ORAL
  Filled 2016-06-14 (×2): qty 1

## 2016-06-14 MED ORDER — LIDOCAINE-PRILOCAINE 2.5-2.5 % EX CREA: 1.0000 "application " | TOPICAL_CREAM | CUTANEOUS | Status: DC | PRN

## 2016-06-14 MED ORDER — ALTEPLASE 2 MG IJ SOLR
2.0000 mg | Freq: Once | INTRAMUSCULAR | Status: AC | PRN
  Administered 2016-06-14 (×2): 2 mg

## 2016-06-14 MED ORDER — PENTAFLUOROPROP-TETRAFLUOROETH EX AERO
1.0000 | INHALATION_SPRAY | CUTANEOUS | Status: DC | PRN
Start: 2016-06-14 — End: 2016-06-20

## 2016-06-14 MED ORDER — ACYCLOVIR 400 MG PO TABS
400.0000 mg | ORAL_TABLET | Freq: Once | ORAL | Status: DC
  Filled 2016-06-14: qty 1

## 2016-06-14 MED ORDER — ACYCLOVIR 400 MG PO TABS
200.0000 mg | ORAL_TABLET | Freq: Two times a day (BID) | ORAL | Status: DC
  Filled 2016-06-14 (×2): qty 0.5

## 2016-06-14 NOTE — Plan of Care (Signed)
Problem: Respiratory: Goal: Ability to maintain a clear airway and adequate ventilation will improve Outcome: Progressing Patient has remained off of ventilator since on unit 4E

## 2016-06-14 NOTE — Progress Notes (Signed)
  Speech Language Pathology Treatment: Dysphagia  Patient Details Name: Omar [name] MRN: [id number] DOB: 06/17/1945 Today's Date: 06/14/2016 Time: 0900-0950 SLP Time Calculation (min) (ACUTE ONLY): 50 min  Assessment / Plan / Recommendation Clinical Impression  Pt seen with PT for repositioning, then with oral care, am meal, PMSV usage. Pt able to wear PMSV for over 30 minutes with sign of back pressure or change in vital signs of suggest incomplete redirection of air to upper airway. Pt does still struggle with adequate breath support for voice and only phonated x1 during session when he called out to "Wait a minute!" when in pain during mobility activity. Otherwise pt did not attempt to speak or was aphonic with max cues to repeat or answer questions. Poor voicing is likely a combination of problems including poor strength and breath support to push air forcefully past 6 cuffed trach, positioning, and cognition. SLP offered trials of nectar thick liquids and dys 3 solids due to pt refusal of pureed textures. He tolerated these well, particularly good mastication of soft pears with adequate timely swallow response. Pt did have some oral holding with nectar thick liquids and needed verbal cues to swallow. Suspect PO intake will be poor, will need to offer greatest variety of textures and liquids as possible. Will upgrade diet to dys 2/nectar thick liquids and monitor for tolerance.   HPI HPI: 71 y/o male from [facility] admitted 3/5 with altered mental status with aspiration, sepsis, hypoxic/hypercapnic respiratory failure. Course c/b retroperitoneal bleed, shock, acute renal failure & prolonged ventilation requiring tracheostomy.  ETT 3/05-3/08; 3/10 - trach 3/21. CRRT.      SLP Plan  Continue with current plan of care       Recommendations  Diet recommendations: Nectar-thick liquid;Dysphagia 2 (fine chop) Liquids provided via: Cup;Straw Medication Administration: Whole meds with liquid (or with  puree)      Patient may use Passy-Muir Speech Valve: Intermittently with supervision;During all therapies with supervision;During PO intake/meals PMSV Supervision: Full MD: Please consider changing trach tube to : Smaller size;Cuffless         Oral Care Recommendations: Oral care QID Follow up Recommendations: LTACH SLP Visit Diagnosis: Dysphagia, oral phase (R13.11) Plan: Continue with current plan of care       GO               [facility], [place] CCC-SLP [phone]  Claudine Mouton 06/14/2016, 10:42 AM

## 2016-06-14 NOTE — Progress Notes (Signed)
Patient transferred via bariatric bed to 4N14. Patient requires airborne precautions. Called to notify unit that patient was on his way. Called to notify family of transfer.   Noe Gens, RN

## 2016-06-14 NOTE — Progress Notes (Signed)
PROGRESS NOTE  PAO HAFFEY  MPN:[id number] DOB: [birth year] DOA: 05/07/2016 PCP: Redge Gainer, MD  Brief Narrative:   71 y/o male from [place] admitted 3/5 with altered mental status + H.flu PNA with aspiration, sepsis, hypoxic/hypercapnic respiratory failure. Course c/b retroperitoneal bleed while on heparin with subsequent shock, acute renal failure & prolonged ventilation requiring tracheostomy.  On CRRT intermittently through hosp and has not tolerated HD due to hypotension.  He has had a tunneled HD catheter placed.  He continues to have significant hypotension preventing placement of permanent feeding tube.  Continuing to have SLP reevaluate.   Started on dys I diet per SLP 4/10.  Palliative care is following.  Needs surgical G-tube placement once blood pressures have improved enough to tolerate anesthesia, then discharge to LTAC.    Antibiotics: Zosyn 3/06 >> 3/08 Rocephin (H.Flu PNA) 3/08 >> 3/14 Vancomycin 3/23 >> 3/28 Cefepime 3/23 >> 4/1 Meropenem 4/1 >> Vancomycin 4/3 >> 4/4  Cultures: Urine 3/05 >> negative Blood 3/06 >> negative Resp 3/6 >> H flu (b-lactamase +) Trach stoma site 3/23 >> diphtheroids Resp cx 3/28 >>nl flora Blood 4/1 NGTD  Lines/tubes: ETT 3/05 >> 3/8 ETT 3/10 >> 3/21 Trach (Dr Redmond Baseman) 3/21 >>  Lt IJ CVL 3/07 >> 3/27 R HD catheter  >> 3/22 R IJ HD catheter 3/23 >>   Events: 3/05  Transfer to [facility] Sputum 3/06 >> Haemophilus influenzae (beta lactamase positive) Echo 3/06 >> EF 65 to 70%, mod LVH, mod TR, PAS 39 mmHg CT head 3/7 >> no acute abnormality  3/10 - Large retroperitoneal bleed noted on CT after c/o back pain/leg pain 3/27 rt humerus fracture identified - ortho consult 3/27 RUQ [place] .Marland Kitchen Not helpful due to body habitus. On ATC with copious secrtions.  3/29 - Chronic  critically ill 3/27 - end CRRT 3/30 - doing ATC x 24-48h per RN. Rt shoulder pain only when touched per RN. Making urine. Off CRRT. Not on pressors. Family not fully on board  about LTAC per RN but looking at [place]. Needs LTAC.  4/2 - resumed CRRT 4/3 - Family agreeable to PEG tube 4/5 - G-tube to be placed by gen surg however anesthesia refused 2/2 hypotension.  4/6 - restarted HD. Tolerated.  4/7 - transferred to SDU 4/8 - tunneled HD catheter by IR 4/9 - recurrent sepsis/fevers 4/10 - vesicular/bullous rash on right chest  Assessment & Plan:    Active Problems:   Paroxysmal atrial fibrillation (HCC)   Hemiparesis affecting right side as late effect of cerebrovascular accident [facility])   Hypothyroidism   Acute respiratory failure with hypoxemia (HCC)   Shock circulatory (HCC)   Altered mental status   Retroperitoneal hemorrhage   Goals of care, counseling/discussion   On enteral nutrition   Acute renal failure (ARF) (HCC)   Electrolyte imbalance   Shock liver   Current use of proton pump inhibitor   Contraindication to deep vein thrombosis (DVT) prophylaxis   Hepatic lesion   Palliative care encounter   Acute respiratory failure with hypercapnia (HCC)   AKI (acute kidney injury) ([place])   Stage 5 chronic kidney disease on chronic dialysis ([place])   HCAP (healthcare-associated pneumonia)   Palliative care by specialist   Tracheostomy care ([place])   Pressure injury of skin  Acute on chronic hypoxemic respiratory failure, improved.   Pneumonia, H influenza (beta lactamase positive), treated New HCAP on 4/1, treated Pulm edema, improved Presumed OSA/OHS History of asthma Cont ATC, keep o2 sats > 88% Off  vent since 4/2 duoneb qid + pulmicort BID Completed a course of meropenem on 4/8 for HCAP Trach change c/o ENT.   Septic and hemorrhagic shock, resolved. Off pressors 3/31 but had been intermittently hypotensive, slowly improving Paroxysmal atrial fibrillation History of hypertension Recurrent sepsis with tachycardic, hypotension, and fever on 4/9 Off stress dose steroids, continue to hold anti-hypertensives. Continue increased midodrine to 33m  TID(started on 4/5) Blood cultures 4/10 CXR:  No pneumonia Tracheal aspirate culture pending UA abnormal but in setting of ARF, may not represent infection Varicella zoster PCR on Right chest wall bullae and vesicles Airborne precautions Due to worsening hypotension, start vanc and ceftaz pending culture data  Vesicular/bullous rash on right chest, consider metal allergy from the sling he has been wearing.  DDx also include shingles.  T4 dermatome and has a large ulcerated area on his back in same dermatomal distribution -  Varicella PCR pending -  Cover metal ring with tape -  Airborne precautions  New ESRD, Acute renal failure, presumed due to ATN in the setting of hemorrhagic shock. Pt intermittently on CRRT and unable to tolerate HD 2/2 hypotension. Currently on HD since 4/6 HD MWF Midodrine increasedwith better blood pressures Tunneled HD catheter placed on 4/9 by IR  Large retroperitoneal bleed noted 3/10, some slight increased 3/18 CT scan. Hb has been stable.   Shock liver improving.  Hepatic lesion; noted on CT scan of the abdomen 3/18. 5 cm lesion of the dome of the liver, could represent blood or infection or infarct.  Protein calorie malnutrition Continue PPI Continue tube feeds for now PEG/G tube unable to be placed.  IR unable to place 2/2 complicated anatomy. Gen surg unable to place 2/2 hypotension. Blood pressures improving, will ask general surgery for reevaluation LTAC can take him but he would have to come back to the ER for GJ placement  Acute blood loss anemia superimposed on anemia of chronic kidney disease, improved Severe heparin sensitivity, associated with hemorrhage Thrombocytopenia, acute on chronic Thrombocytopenia improving Follow CBC, Goal hemoglobin greater than 7.0.  No recent bleeding or transfusion.  No heparin for now.  Hold anticoagulation  SCD for DVT prophylaxis  H. influenzae pneumonia and HCAP treatment completed Cefepime,  started 3/23, [facility] for possible HCAP.  Completed meropenem on 4/8.  Cultures (-) so far.   Hyperglycemia, on steroids, resolved Hypothyroidism, stable, continue synthroid  Metabolic uremic encephalopathy, improved.  History of CVA with right hemiplegia Rt humerus fracture  Mentation improving PT consult - LTAC recommended.  RUE sling Morphine prn pain  FAMILY DISCUSSIONS:   Family OK with PEG tube as of 4/3 but unfortunately pt could not tolerate anesthesia.  Dr. DCorrie Dandyspoke with granddaughter on 4/8 and updated her of pts condition that he was transferred to SDU.   [facility] if [facility].  Otherwise, plan for LTAC when ready.    DVT prophylaxis:  SCDs Code Status:  full Family Communication:  Patient alone [facility]  [facility] need more definitive feeding arrangements made  Consultants:   General surgery  Nephrology  ENT  Orthopedic surgery, Dr. JJacqulynn Cadet Palliative care  Interventional radiology   Antimicrobials:  Anti-infectives    Start     Dose/Rate Route Frequency Ordered Stop   06/14/16 1800  cefTAZidime (FORTAZ) 2 g in dextrose 5 % 50 mL IVPB     2 g 100 mL/hr over  30 Minutes Intravenous Every M-W-F (Hemodialysis) 06/13/16 1338     06/14/16 1200  vancomycin (VANCOCIN) IVPB 1000 mg/200 mL premix     1,000 mg 200 mL/hr over 60 Minutes Intravenous Every M-W-F (Hemodialysis) 06/13/16 1338     06/13/16 1300  vancomycin (VANCOCIN) 2,000 mg in sodium chloride 0.9 % 500 mL IVPB     2,000 mg 250 mL/hr over 120 Minutes Intravenous  Once 06/13/16 1244 06/13/16 1645   06/13/16 1300  cefTAZidime (FORTAZ) 2 g in dextrose 5 % 50 mL IVPB     2 g 100 mL/hr over 30 Minutes Intravenous  Once 06/13/16 1244 06/13/16 1506   06/12/16 1448  ceFAZolin (ANCEF) 2-4 GM/100ML-% IVPB  Status:  Discontinued    Comments:  Shaaron Adler   : cabinet override       06/12/16 1448 06/12/16 1454   06/12/16 1432  ceFAZolin (ANCEF) 2-4 GM/100ML-% IVPB    Comments:  Laughlin, Amy   : cabinet override      06/12/16 1432 06/12/16 1436   07/03/2016 0900  meropenem (MERREM) 500 mg in sodium chloride 0.9 % 50 mL IVPB     500 mg 100 mL/hr over 30 Minutes Intravenous Every 24 hours 06/07/16 0955 06/11/16 1109   06/11/2016 0600  cefoTEtan (CEFOTAN) 2 g in dextrose 5 % 50 mL IVPB  Status:  Discontinued     2 g 100 mL/hr over 30 Minutes Intravenous To [facility] 06/07/16 1354 06/27/2016 1157   06/06/16 1200  vancomycin (VANCOCIN) 1,500 mg in sodium chloride 0.9 % 500 mL IVPB  Status:  Discontinued     1,500 mg 250 mL/hr over 120 Minutes Intravenous Every 24 hours 06/06/16 0818 06/07/16 0954   06/05/16 1000  meropenem (MERREM) 1 g in sodium chloride 0.9 % 100 mL IVPB  Status:  Discontinued     1 g 200 mL/hr over 30 Minutes Intravenous Every 12 hours 06/05/16 0926 06/07/16 0955   06/04/16 1400  vancomycin (VANCOCIN) 2,000 mg in sodium chloride 0.9 % 500 mL IVPB     2,000 mg 250 mL/hr over 120 Minutes Intravenous  Once 06/04/16 1234 06/04/16 1737   06/04/16 1400  meropenem (MERREM) 500 mg in sodium chloride 0.9 % 50 mL IVPB  Status:  Discontinued     500 mg 100 mL/hr over 30 Minutes Intravenous Every 24 hours 06/04/16 1234 06/05/16 0926   05/30/16 2300  ceFEPIme (MAXIPIME) 1 g in dextrose 5 % 50 mL IVPB  Status:  Discontinued     1 g 100 mL/hr over 30 Minutes Intravenous Every 24 hours 05/30/16 1223 06/04/16 1231   05/27/16 1400  ceFEPIme (MAXIPIME) 2 g in dextrose 5 % 50 mL IVPB  Status:  Discontinued     2 g 100 mL/hr over 30 Minutes Intravenous Every 12 hours 05/27/16 1014 05/30/16 1223   05/27/16 1400  vancomycin (VANCOCIN) 1,500 mg in sodium chloride 0.9 % 500 mL IVPB  Status:  Discontinued     1,500 mg 250 mL/hr over 120 Minutes Intravenous Every 24 hours 05/27/16 1014 05/30/16 1224   05/26/16 1300  ceFEPIme (MAXIPIME) 1 g in dextrose 5 % 50 mL IVPB   Status:  Discontinued     1 g 100 mL/hr over 30 Minutes Intravenous Every 24 hours 05/26/16 1225 05/27/16 1014   05/26/16 1230  vancomycin (VANCOCIN) 2,500 mg in sodium chloride 0.9 % 500 mL IVPB     2,500 mg 250 mL/hr over 120 Minutes Intravenous  Once 05/26/16 1225 05/26/16 1530  05/16/16 1400  cefTRIAXone (ROCEPHIN) 2 g in dextrose 5 % 50 mL IVPB     2 g 100 mL/hr over 30 Minutes Intravenous Every 24 hours 05/15/16 1649 05/17/16 1458   05/11/16 1400  cefTRIAXone (ROCEPHIN) 1 g in dextrose 5 % 50 mL IVPB  Status:  Discontinued     1 g 100 mL/hr over 30 Minutes Intravenous Every 24 hours 05/11/16 0833 05/15/16 1649   05/09/16 1400  piperacillin-tazobactam (ZOSYN) IVPB 3.375 g  Status:  Discontinued     3.375 g 12.5 mL/hr over 240 Minutes Intravenous Every 8 hours 05/09/16 1114 05/11/16 0833     Subjective:  Patient able to nod and shake his head. Denies pain, difficulty breathing, nausea.  Objective: Vitals:   06/13/16 2350 06/14/16 0012 06/14/16 0343 06/14/16 0424  BP:  127/83  110/64  Pulse: (!) 116 (!) 113 (!) 114 95  Resp: 19 20 (!) 21 14  Temp:  98.5 F (36.9 C)    TempSrc:  Axillary  (P) Oral  SpO2: 96% 99% 97% 100%  Weight:      Height:        Intake/Output Summary (Last 24 hours) at 06/14/16 0727 Last data filed at 06/13/16 2350  Gross per 24 hour  Intake          1333.33 ml  Output              400 ml  Net           933.33 ml   Filed Weights   06/06/16 0358 06/07/16 0415  Weight: (!) 151.5 kg (334 lb) (!) 154.2 kg (340 lb)    Examination:  General exam:  Morbidly obese male, trach collar in place with rhonchorous breath sounds, No acute distress.  HEENT:  MMM, trach collar in place Respiratory system:  no focal rales or wheezes Cardiovascular system: tachycardic, normal S1/S2. No murmurs, rubs, gallops or clicks.  Warm extremities Gastrointestinal system: Hyperactive bowel sounds, soft, nondistended, nontender. MSK:  Decreased bulk and strength.  Right  arm in sling.  2+ pitting bilateral lower extremity edema Neuro:  3 out of 5 strength of the right upper and right lower extremities, 4-5 strength left upper and lower extremities Skin:  Bullous rash on right chest.  Has a large ulcerated area on his back in same distribution.  No vesicles under right arm, however.  Base of lesions is not particularly erythematous.   GU:  Urine in foley is dark brown.   Data Reviewed: I have personally reviewed following labs and imaging studies  CBC:  Recent Labs Lab 06/23/2016 0500 06/09/16 0423 06/10/16 0420 06/11/16 0500 06/12/16 0417 06/14/16 0535  WBC 5.1 5.7 5.1 4.1 4.7 4.4  NEUTROABS 3.6  --   --   --   --   --   HGB 7.3* 7.4* 7.6* 7.8* 8.1* 8.0*  HCT 23.3* 24.1* 24.6* 25.7* 26.1* 25.0*  MCV 111.0* 110.6* 109.8* 110.8* 110.6* 111.1*  PLT 52* 77* 67* 78* 103* 390*   Basic Metabolic Panel:  Recent Labs Lab 06/09/2016 0500 06/09/16 0423 06/10/16 0500 06/11/16 0500 06/12/16 0417 06/13/16 0415  NA 134* 134* 134* 133*  133* 131* 132*  K 4.2 4.3 3.6 4.0  4.0 4.3 3.7  CL 102 101 98* 96*  96* 96* 96*  CO2 _0 GLUCOSE 91 110* 109* 96  95 105* 112*  BUN 59* 81* 63* 44*  44* 69* 42*  CREATININE 2.71* 3.84*  3.09* 2.53*  2.52* 3.95* 3.16*  CALCIUM 8.0* 8.1* 8.0* 7.9*  8.0* 8.0* 7.9*  MG 2.2  --   --   --   --   --   PHOS 3.4  --  5.6* 4.5 6.7* 5.1*   GFR: Estimated Creatinine Clearance: 29.9 mL/min (A) (by C-G formula based on SCr of 3.16 mg/dL (H)). Liver Function Tests:  Recent Labs Lab 06/05/2016 0500 06/10/16 0500 06/11/16 0500 06/12/16 0417 06/13/16 0415  ALBUMIN 1.7* 1.7* 1.7* 1.9* 1.7*   No results for input(s): LIPASE, AMYLASE in the last 168 hours. No results for input(s): AMMONIA in the last 168 hours. Coagulation Profile:  Recent Labs Lab 06/15/2016 0550 06/12/16 0417  INR 1.33 1.27   Cardiac Enzymes: No results for input(s): CKTOTAL, CKMB, CKMBINDEX, TROPONINI in the last 168 hours. BNP  (last 3 results) No results for input(s): PROBNP in the last 8760 hours. HbA1C: No results for input(s): HGBA1C in the last 72 hours. CBG:  Recent Labs Lab 06/13/16 1225 06/13/16 1630 06/13/16 1937 06/14/16 0011 06/14/16 0432  GLUCAP 99 108* 98 112* 116*   Lipid Profile: No results for input(s): CHOL, HDL, LDLCALC, TRIG, CHOLHDL, LDLDIRECT in the last 72 hours. Thyroid Function Tests: No results for input(s): TSH, T4TOTAL, FREET4, T3FREE, THYROIDAB in the last 72 hours. Anemia Panel: No results for input(s): VITAMINB12, FOLATE, FERRITIN, TIBC, IRON, RETICCTPCT in the last 72 hours. Urine analysis:    Component Value Date/Time   COLORURINE AMBER (A) 06/13/2016 0808   APPEARANCEUR CLOUDY (A) 06/13/2016 0808   LABSPEC 1.023 06/13/2016 0808   PHURINE 6.0 06/13/2016 0808   GLUCOSEU NEGATIVE 06/13/2016 0808   HGBUR LARGE (A) 06/13/2016 0808   BILIRUBINUR NEGATIVE 06/13/2016 0808   BILIRUBINUR small 10/06/2014 1612   KETONESUR NEGATIVE 06/13/2016 0808   PROTEINUR 100 (A) 06/13/2016 0808   UROBILINOGEN 2.0 10/06/2014 1612   UROBILINOGEN 4.0 (H) 05/30/2010 1511   NITRITE NEGATIVE 06/13/2016 0808   LEUKOCYTESUR MODERATE (A) 06/13/2016 0808    Recent Results (from the past 240 hour(s))  Culture, blood (Routine X 2) w Reflex to ID Panel     Status: None   Collection Time: 06/04/16 12:45 PM  Result Value Ref Range Status   Specimen Description BLOOD LEFT ARM  Final   Special Requests IN PEDIATRIC BOTTLE Blood Culture adequate volume  Final   Culture NO GROWTH 5 DAYS  Final   Report Status 06/09/2016 FINAL  Final  Culture, blood (Routine X 2) w Reflex to ID Panel     Status: None   Collection Time: 06/04/16 12:45 PM  Result Value Ref Range Status   Specimen Description BLOOD LEFT HAND  Final   Special Requests   Final    BOTTLES DRAWN AEROBIC ONLY Blood Culture adequate volume   Culture NO GROWTH 5 DAYS  Final   Report Status 06/09/2016 FINAL  Final  Culture, respiratory  (NON-Expectorated)     Status: None   Collection Time: 06/04/16  4:22 PM  Result Value Ref Range Status   Specimen Description TRACHEAL ASPIRATE  Final   Special Requests NONE  Final   Gram Stain   Final    ABUNDANT WBC PRESENT, PREDOMINANTLY PMN FEW GRAM NEGATIVE RODS FEW GRAM POSITIVE COCCI IN CLUSTERS FEW GRAM POSITIVE COCCI IN PAIRS    Culture Consistent with normal respiratory flora.  Final   Report Status 06/07/2016 FINAL  Final  Culture, Urine     Status: None   Collection Time: 06/04/16  8:57 PM  Result Value Ref Range Status   Specimen Description URINE, RANDOM  Final   Special Requests NONE  Final   Culture NO GROWTH  Final   Report Status 06/06/2016 FINAL  Final  MRSA PCR Screening     Status: Abnormal   Collection Time: 06/11/16  4:18 AM  Result Value Ref Range Status   MRSA by PCR INVALID RESULTS, SPECIMEN SENT FOR CULTURE (A) NEGATIVE Final    Comment: NOTIFIED RN JENNIFER AT [phone] 06/11/16 BY A.DAVIS  MRSA culture     Status: None (Preliminary result)   Collection Time: 06/11/16  4:18 AM  Result Value Ref Range Status   Specimen Description NASAL SWAB  Final   Special Requests NONE  Final   Culture CULTURE REINCUBATED FOR BETTER GROWTH  Final   Report Status PENDING  Incomplete  Culture, respiratory (NON-Expectorated)     Status: None (Preliminary result)   Collection Time: 06/13/16  9:04 AM  Result Value Ref Range Status   Specimen Description TRACHEAL ASPIRATE  Final   Special Requests Normal  Final   Gram Stain   Final    MODERATE WBC PRESENT,BOTH PMN AND MONONUCLEAR FEW GRAM POSITIVE COCCI IN PAIRS IN CLUSTERS RARE GRAM NEGATIVE RODS    Culture PENDING  Incomplete   Report Status PENDING  Incomplete      Radiology Studies: Ir Fluoro Guide Cv Line Left  Result Date: 06/12/2016 CLINICAL DATA:  Dialysis catheter placement EXAM: CHEST FLUOROSCOPY COMPARISON:  06/09/2016 FINDINGS: Interval placement of left dialysis catheter with the tips in the right  atrium. Right catheter tip remains in the SVC. Tracheostomy tube is unchanged. IMPRESSION: Left dialysis catheter placement with the tip in the right atrium. Other support devices are stable. Electronically Signed   By: Rolm Baptise M.D.   On: 06/12/2016 15:43   Ir US Guide Vasc Access Left  Result Date: 06/12/2016 CLINICAL DATA:  Dialysis catheter placement EXAM: CHEST FLUOROSCOPY COMPARISON:  06/09/2016 FINDINGS: Interval placement of left dialysis catheter with the tips in the right atrium. Right catheter tip remains in the SVC. Tracheostomy tube is unchanged. IMPRESSION: Left dialysis catheter placement with the tip in the right atrium. Other support devices are stable. Electronically Signed   By: Rolm Baptise M.D.   On: 06/12/2016 15:43   Dg Chest Port 1 View  Result Date: 06/13/2016 CLINICAL DATA:  Fever. EXAM: PORTABLE CHEST 1 VIEW COMPARISON:  Radiograph of June 09, 2016. FINDINGS: Stable cardiomegaly. Tracheostomy tube is unchanged in position. Feeding tube is unchanged in position. Right internal jugular catheter is unchanged. Interval placement of left internal jugular dialysis catheter with distal tip in expected position of cavoatrial junction. No pneumothorax is is noted. Probable small right pleural effusion is noted. Mild bibasilar subsegmental atelectasis is noted. Bony thorax is unremarkable. IMPRESSION: Interval placement of left internal jugular dialysis catheter. Otherwise stable support apparatus. Probable small right pleural effusion. Mild bibasilar subsegmental atelectasis. Electronically Signed   By: Marijo Conception, M.D.   On: 06/13/2016 07:47   Scheduled Meds: . anticoagulant sodium citrate  10 mL Intravenous Once  . budesonide (PULMICORT) nebulizer solution  0.5 mg Nebulization BID  . cefTAZidime (FORTAZ)  IV  2 g Intravenous Q M,W,F-HD  . chlorhexidine gluconate (MEDLINE KIT)  15 mL Mouth Rinse BID  . [START ON 14-Jul-2016] darbepoetin (ARANESP) injection - DIALYSIS  200 mcg  Intravenous Q Wed-HD  . feeding supplement (OSMOLITE 1.5 CAL)  1,000 mL Per Tube Q24H  . feeding supplement (PRO-STAT SUGAR FREE  64)  60 mL Per Tube TID  . insulin aspart  0-9 Units Subcutaneous Q4H  . ipratropium-albuterol  3 mL Nebulization TID  . levothyroxine  125 mcg Per Tube QAC breakfast  . mouth rinse  15 mL Mouth Rinse QID  . midodrine  15 mg Per Tube TID WC  . multivitamin  1 tablet Oral QHS  . pantoprazole sodium  40 mg Per Tube Daily  . vancomycin  1,000 mg Intravenous Q M,W,F-HD   Continuous Infusions: . sodium chloride 10 mL/hr at 06/03/16 2000  . sodium chloride Stopped (06/06/16 0700)     LOS: 37 days   Time spent: 36 min  Irwin Brakeman, MD [facility] Pager [phone]  If 7PM-7AM, please contact night-coverage [email or web address] Password [facility] 06/14/2016, 7:27 AM

## 2016-06-14 NOTE — Progress Notes (Signed)
Physical Therapy Treatment Patient Details Name: Omar [name] MRN: [id number] DOB: 21-Mar-1945 Today's Date: 06/14/2016    History of Present Illness 71 yo admitted with respiratory failure, AMS, aspiration, sepsis, retroperitoneal bleed. ETT3/5-3/8, 3/10-3/21, trach 3/21, 3/27 pt found to have proximal humerus fx without precipitating event. PMHx: CVA with Right hemiplegia, Asthma, Afib, gout, HTN, obesity, OA    PT Comments    Patient seen in conjunction with nsg and SLP for bed mobility, therapeutic activity and upright positioning for SLP. Patient limited by pain RUE. Patient continues to require increased physical assist for all aspects of bed mobility and therapeutic exercises. Current goals remain appropriate, continue to recommend LTACH. Will follow.   Follow Up Recommendations  LTACH;Supervision/Assistance - 24 hour     Equipment Recommendations  None recommended by PT    Recommendations for Other Services OT consult     Precautions / Restrictions Precautions Precautions: Fall Precaution Comments: panda, trach, flexiseal, right hemiplegia Required Braces or Orthoses: Sling Restrictions Weight Bearing Restrictions: Yes RUE Weight Bearing: Non weight bearing RLE Weight Bearing: Non weight bearing    Mobility  Bed Mobility   Bed Mobility: Rolling;Supine to Sit Rolling: Max assist;+2 for physical assistance   Supine to sit: Total assist (mechanical features of bed to perform)     General bed mobility comments: patient required 2 person max assist for rolling in bed, noted pain response R side. Required mechanical features of bed to elevate to upright positioning for SLP therapist activity  Transfers                    Ambulation/Gait                 Stairs            Wheelchair Mobility    Modified Rankin (Stroke Patients Only)       Balance                                            Cognition  Arousal/Alertness: Awake/alert Behavior During Therapy: Flat affect Overall Cognitive Status: Impaired/Different from baseline Area of Impairment: Attention;Following commands                   Current Attention Level: Focused   Following Commands: Follows one step commands inconsistently              Exercises General Exercises - Lower Extremity Ankle Circles/Pumps: AROM;Both;10 reps;Supine Heel Slides: PROM;Both;5 reps (knee flexion PROM, Right limited by pain) Hip ABduction/ADduction: PROM;Both;5 reps Hip Flexion/Marching: PROM;Both;5 reps (limited range)    General Comments        Pertinent Vitals/Pain Pain Assessment: Faces Pain Score: 6  Faces Pain Scale: No hurt Pain Location: RUE and RLE Pain Descriptors / Indicators: Guarding;Grimacing Pain Intervention(s): Limited activity within patient's tolerance    Home Living                      Prior Function            PT Goals (current goals can now be found in the care plan section) Acute Rehab PT Goals Patient Stated Goal: none stated PT Goal Formulation: With patient Time For Goal Achievement: 06/27/16 Potential to Achieve Goals: Fair Progress towards PT goals: Not progressing toward goals - comment    Frequency    Min 2X/week  PT Plan Current plan remains appropriate    Co-evaluation PT/OT/SLP Co-Evaluation/Treatment:  (dove tailed with SLP for positioning for PO intake)           End of Session Equipment Utilized During Treatment: Oxygen Activity Tolerance: Patient limited by lethargy Patient left: in bed;with call bell/phone within reach;with nursing/sitter in room Nurse Communication: Mobility status;Need for lift equipment PT Visit Diagnosis: Muscle weakness (generalized) (M62.81);Hemiplegia and hemiparesis;Other abnormalities of gait and mobility (R26.89) Hemiplegia - Right/Left: Right Hemiplegia - dominant/non-dominant: Non-dominant Hemiplegia - caused by: Other  cerebrovascular disease Pain - Right/Left: Right Pain - part of body: Shoulder;Leg;Ankle and joints of foot (R shoulder, RLE, L foot)     Time: 9147-8295 PT Time Calculation (min) (ACUTE ONLY): 15 min  Charges:  $Therapeutic Activity: 8-22 mins                    G Codes:       Charlotte Crumb, PT DPT  [phone]    Fabio Asa 06/14/2016, 5:09 PM

## 2016-06-14 NOTE — Progress Notes (Signed)
Pharmacy Antibiotic Note  Omar [name] is a 71 y.o. male admitted on 05/06/2016 with shingles.  Pharmacy has been consulted for acyclovir dosing.  Plan: Give acyclovir  PO x 1, then start acyclovir  PO BID Will need supplemental  dose after each dialysis as well  Height:  (167.6 cm) Weight:  (bedscale not working) IBW/kg (Calculated) : 63.8  Temp (24hrs), Avg:99 F (37.2 C), Min:98.5 F (36.9 C), Max:99.7 F (37.6 C)   Recent Labs Lab 06/09/16 0423 06/10/16 0420 06/10/16 0500 06/11/16 0500 06/12/16 0417 06/13/16 0415 06/14/16 0535  WBC 5.7 5.1  --  4.1 4.7  --  4.4  CREATININE 3.84*  --  3.09* 2.Omar WASHAM3.95* 3.16* 4.40*    Estimated Creatinine Clearance: 21.5 mL/min (A) (by C-G formula based on SCr of 4.4 mg/dL (H)).    Allergies  Allergen Reactions  . Anacin Af [Acetaminophen]   . Heparin     Retroperitoneal bleed in 2018, previous bleed from heparin in 2013  . Niaspan [Niacin Er]     Omar [name] 06/14/2016 3:33 PM

## 2016-06-14 NOTE — Progress Notes (Signed)
Hemo-RN made aware that patient on airborne precautions for suspected shingles and  unable to travel unless negative pressure room available. Hemo-RN to make arrangements to have HD done in patients room today.

## 2016-06-14 NOTE — Progress Notes (Signed)
Assessment/Plan: 1 AKI oliguric ATN. Not clear will ever be candidate for outpatient HD. S/P [facility] 2 Resp failure with trach limits outpatient HD poss 3 Massive obesity 4 CVA 5 Arm fx 6 RP bleed 7 DM 8 Anemia esa, /Fe 9 low ptlt 10 afib, low BP 11 Nutrition needs PEG P HD Wed  Subjective: Interval History: no chg  Objective: Vital signs in last 24 hours: Temp:  [98.5 F (36.9 C)-99.7 F (37.6 C)] 99.7 F (37.6 C) (04/11 0801) Pulse Rate:  [95-116] 95 (04/11 0424) Resp:  [14-25] 14 (04/11 0424) BP: (84-127)/(55-83) 84/55 (04/11 0858) SpO2:  [94 %-100 %] 100 % (04/11 0856) FiO2 (%):  [28 %] 28 % (04/11 0856) Weight change:   Intake/Output from previous day: 04/10 0701 - 04/11 0700 In: 1333.3 [NG/GT:833.3; IV Piggyback:500] Out: 400 [Stool:400] Intake/Output this shift: No intake/output data recorded.  General appearance: alert and cooperative obese  Lab Results:  Recent Labs  06/12/16 0417 06/14/16 0535  WBC 4.7 4.4  HGB 8.1* 8.0*  HCT 26.1* 25.0*  PLT 103* 130*   BMET:  Recent Labs  06/13/16 0415 06/14/16 0535  NA 132* 133*  K 3.7 4.0  CL 96* 98*  CO2 28 28  GLUCOSE 112* 105*  BUN 42* 70*  CREATININE 3.16* 4.40*  CALCIUM 7.9* 7.9*   No results for input(s): PTH in the last 72 hours. Iron Studies: No results for input(s): IRON, TIBC, TRANSFERRIN, FERRITIN in the last 72 hours. Studies/Results: Ir Fluoro Guide Cv Line Left  Result Date: 06/12/2016 CLINICAL DATA:  Dialysis catheter placement EXAM: CHEST FLUOROSCOPY COMPARISON:  06/09/2016 FINDINGS: Interval placement of left dialysis catheter with the tips in the right atrium. Right catheter tip remains in the SVC. Tracheostomy tube is unchanged. IMPRESSION: Left dialysis catheter placement with the tip in the right atrium. Other support devices are stable. Electronically Signed   By: Rolm Baptise M.D.   On: 06/12/2016 15:43   Ir US Guide Vasc Access Left  Result Date: 06/12/2016 CLINICAL DATA:   Dialysis catheter placement EXAM: CHEST FLUOROSCOPY COMPARISON:  06/09/2016 FINDINGS: Interval placement of left dialysis catheter with the tips in the right atrium. Right catheter tip remains in the SVC. Tracheostomy tube is unchanged. IMPRESSION: Left dialysis catheter placement with the tip in the right atrium. Other support devices are stable. Electronically Signed   By: Rolm Baptise M.D.   On: 06/12/2016 15:43   Dg Chest Port 1 View  Result Date: 06/13/2016 CLINICAL DATA:  Fever. EXAM: PORTABLE CHEST 1 VIEW COMPARISON:  Radiograph of June 09, 2016. FINDINGS: Stable cardiomegaly. Tracheostomy tube is unchanged in position. Feeding tube is unchanged in position. Right internal jugular catheter is unchanged. Interval placement of left internal jugular dialysis catheter with distal tip in expected position of cavoatrial junction. No pneumothorax is is noted. Probable small right pleural effusion is noted. Mild bibasilar subsegmental atelectasis is noted. Bony thorax is unremarkable. IMPRESSION: Interval placement of left internal jugular dialysis catheter. Otherwise stable support apparatus. Probable small right pleural effusion. Mild bibasilar subsegmental atelectasis. Electronically Signed   By: Marijo Conception, M.D.   On: 06/13/2016 07:47    Scheduled: . anticoagulant sodium citrate  10 mL Intravenous Once  . budesonide (PULMICORT) nebulizer solution  0.5 mg Nebulization BID  . cefTAZidime (FORTAZ)  IV  2 g Intravenous Q M,W,F-HD  . chlorhexidine gluconate (MEDLINE KIT)  15 mL Mouth Rinse BID  . [START ON 07-05-2016] darbepoetin (ARANESP) injection - DIALYSIS  200 mcg Intravenous  Q Wed-HD  . feeding supplement (OSMOLITE 1.5 CAL)  1,000 mL Per Tube Q24H  . feeding supplement (PRO-STAT SUGAR FREE 64)  60 mL Per Tube TID  . insulin aspart  0-9 Units Subcutaneous Q4H  . ipratropium-albuterol  3 mL Nebulization TID  . levothyroxine  125 mcg Per Tube QAC breakfast  . mouth rinse  15 mL Mouth Rinse QID   . midodrine  15 mg Per Tube TID WC  . multivitamin  1 tablet Oral QHS  . pantoprazole sodium  40 mg Per Tube Daily  . vancomycin  1,000 mg Intravenous Q M,W,F-HD    LOS: 37 days   Thoams Siefert C 06/14/2016,2:24 PM

## 2016-06-14 NOTE — Progress Notes (Signed)
Message sent to Dr Laural Benes that Varicella zoster antibody, IgM done yesterday per lab.

## 2016-06-14 NOTE — Progress Notes (Signed)
   [facility] PCCM  > I updated family at the start of the week.  I also spoke with [facility] at the start of the week re: transfer to a SDU bed per family's request.  No bed at [facility] then and pt was placed on the list.  [facility] said they agree with current management and they agree with LTAC or SNF admission.    > pt has not required ventilator and has finished abx.  > he tolerates HD  > I think pt should be transitioned to a  Rehab place  vs SNF.  I am not sure if he will benefit from LTAC at this point.   > keep him on ATC, titrate o2 to kep sats > 88%.   > I suggest NOT to decannulate at this point. Needs to f/u with ENT and trache clinic as outpt  > PCCM will sign off for now. Pls calll back if with issues.   Pollie Meyer, MD 06/14/2016, 9:23 PM [facility] Pager [phone] After 3 pm or if no answer, call [phone]

## 2016-06-15 LAB — CULTURE, RESPIRATORY W GRAM STAIN: Special Requests: NORMAL

## 2016-06-15 LAB — CBC
HCT: 24.3 % — ABNORMAL LOW (ref 39.0–52.0)
Hemoglobin: 7.9 g/dL — ABNORMAL LOW (ref 13.0–17.0)
MCH: 36.1 pg — ABNORMAL HIGH (ref 26.0–34.0)
MCHC: 32.5 g/dL (ref 30.0–36.0)
MCV: 111 fL — AB (ref 78.0–100.0)
PLATELETS: 115 10*3/uL — AB (ref 150–400)
RBC: 2.19 MIL/uL — AB (ref 4.22–5.81)
RDW: 23.1 % — AB (ref 11.5–15.5)
WBC: 4.5 10*3/uL (ref 4.0–10.5)

## 2016-06-15 LAB — RENAL FUNCTION PANEL
ALBUMIN: 1.7 g/dL — AB (ref 3.5–5.0)
Anion gap: 8 (ref 5–15)
BUN: 45 mg/dL — ABNORMAL HIGH (ref 6–20)
CHLORIDE: 100 mmol/L — AB (ref 101–111)
CO2: 24 mmol/L (ref 22–32)
CREATININE: 3.56 mg/dL — AB (ref 0.61–1.24)
Calcium: 7.7 mg/dL — ABNORMAL LOW (ref 8.9–10.3)
GFR, EST AFRICAN AMERICAN: 18 mL/min — AB (ref >60)
GFR, EST NON AFRICAN AMERICAN: 16 mL/min — AB (ref >60)
Glucose, Bld: 106 mg/dL — ABNORMAL HIGH (ref 65–99)
PHOSPHORUS: 5.3 mg/dL — AB (ref 2.5–4.6)
POTASSIUM: 4.2 mmol/L (ref 3.5–5.1)
Sodium: 132 mmol/L — ABNORMAL LOW (ref 135–145)

## 2016-06-15 LAB — CULTURE, RESPIRATORY: CULTURE: NORMAL

## 2016-06-15 LAB — GLUCOSE, CAPILLARY
GLUCOSE-CAPILLARY: 106 mg/dL — AB (ref 65–99)
GLUCOSE-CAPILLARY: 100 mg/dL — AB (ref 65–99)
Glucose-Capillary: 117 mg/dL — ABNORMAL HIGH (ref 65–99)
Glucose-Capillary: 95 mg/dL (ref 65–99)
Glucose-Capillary: 94 mg/dL (ref 65–99)

## 2016-06-15 MED ORDER — ACYCLOVIR 200 MG PO CAPS
400.0000 mg | ORAL_CAPSULE | Freq: Once | ORAL | Status: DC
  Filled 2016-06-15: qty 2

## 2016-06-15 MED ORDER — ACYCLOVIR 400 MG PO TABS
200.0000 mg | ORAL_TABLET | Freq: Every day | ORAL | Status: DC
  Filled 2016-06-15: qty 0.5

## 2016-06-15 MED ORDER — ACYCLOVIR 400 MG PO TABS
200.0000 mg | ORAL_TABLET | Freq: Every morning | ORAL | Status: DC
  Filled 2016-06-15: qty 0.5

## 2016-06-15 MED ORDER — CEFTAZIDIME SODIUM IN D5W 2-5 GM/50ML-% IV SOLN
2.0000 g | INTRAVENOUS | Status: DC
  Filled 2016-06-15: qty 50

## 2016-06-15 MED ORDER — ACYCLOVIR 200 MG PO CAPS
200.0000 mg | ORAL_CAPSULE | ORAL | Status: DC
  Administered 2016-06-15 – 2016-06-17 (×2): 200 mg via ORAL
  Filled 2016-06-15 (×2): qty 1

## 2016-06-15 MED ORDER — ACYCLOVIR 400 MG PO TABS: 600.0000 mg | ORAL_TABLET | ORAL | Status: DC

## 2016-06-15 MED ORDER — ACYCLOVIR 400 MG PO TABS: 200.0000 mg | ORAL_TABLET | ORAL | Status: DC

## 2016-06-15 MED ORDER — ACYCLOVIR 200 MG PO CAPS: 200.0000 mg | ORAL_CAPSULE | Freq: Two times a day (BID) | ORAL | Status: DC

## 2016-06-15 MED ORDER — ACYCLOVIR 200 MG PO CAPS
600.0000 mg | ORAL_CAPSULE | ORAL | Status: DC
  Administered 2016-06-16: 600 mg via ORAL
  Filled 2016-06-15: qty 3

## 2016-06-15 MED ORDER — DEXTROSE 5 % IV SOLN
2.0000 g | Freq: Once | INTRAVENOUS | Status: DC
  Filled 2016-06-15: qty 2

## 2016-06-15 MED ORDER — ACYCLOVIR 400 MG PO TABS
400.0000 mg | ORAL_TABLET | Freq: Once | ORAL | Status: AC
  Administered 2016-06-15: 400 mg via ORAL
  Filled 2016-06-15 (×2): qty 1

## 2016-06-15 MED ORDER — DEXTROSE 5 % IV SOLN
2.0000 g | Freq: Once | INTRAVENOUS | Status: AC
  Administered 2016-06-15: 2 g via INTRAVENOUS
  Filled 2016-06-15: qty 2

## 2016-06-15 MED ORDER — ACYCLOVIR 200 MG PO CAPS: 600.0000 mg | ORAL_CAPSULE | ORAL | Status: DC

## 2016-06-15 MED ORDER — CEFTAZIDIME 2 G IV SOLR: 2.0000 g | Freq: Once | INTRAVENOUS | Status: DC

## 2016-06-15 MED ORDER — ACYCLOVIR 200 MG PO CAPS
200.0000 mg | ORAL_CAPSULE | Freq: Every day | ORAL | Status: DC
  Filled 2016-06-15: qty 1

## 2016-06-15 NOTE — Progress Notes (Signed)
  Speech Language Pathology Treatment: Dysphagia  Patient Details Name: Omar [name] MRN: [id number] DOB: May 19, 1945 Today's Date: 06/15/2016 Time: 0910-0929 SLP Time Calculation (min) (ACUTE ONLY): 19 min  Assessment / Plan / Recommendation Clinical Impression  Pt demonstrates poor PO intake, max encouragement needed for pt to take 3 bites of yogurt and two sips of thin ginger ale. Tried to offer most palatable textures to determine if dysphagia diet was inhibiting intake, but that does not seem to be the case. Pt appears distracted by pain, generally uninterested in PO. When asked why he did not want to eat or drink, he did not respond. SLP was able to observe improved breath support for speech today; Pt communicated at phrase/sentence level with audible vocal quality with PMSV in place, again no concern for intolerance at this time with fully supervision. Recommend pt use PMSV with full staff supervision, particularly when eating/drinking. Encouraged RN to attempt PO. Difficulty to determine how pt would tolerate PO unless NG tube discontinued to encourage appetite.   HPI HPI: 71 y/o male from [facility] admitted 3/5 with altered mental status with aspiration, sepsis, hypoxic/hypercapnic respiratory failure. Course c/b retroperitoneal bleed, shock, acute renal failure & prolonged ventilation requiring tracheostomy.  ETT 3/05-3/08; 3/10 - trach 3/21. CRRT.      SLP Plan  Continue with current plan of care       Recommendations  Diet recommendations: Dysphagia 2 (fine chop);Nectar-thick liquid Liquids provided via: Cup;Straw Medication Administration: Whole meds with liquid Supervision: Staff to assist with self feeding;Full supervision/cueing for compensatory strategies Compensations: Slow rate;Small sips/bites;Multiple dry swallows after each bite/sip Postural Changes and/or Swallow Maneuvers: Seated upright 90 degrees      Patient may use Passy-Muir Speech Valve: Intermittently with  supervision;During all therapies with supervision;During PO intake/meals PMSV Supervision: Full MD: Please consider changing trach tube to : Smaller size;Cuffless         Oral Care Recommendations: Oral care QID Follow up Recommendations: LTACH SLP Visit Diagnosis: Dysphagia, oral phase (R13.11) Plan: Continue with current plan of care       GO               [facility], [place] CCC-SLP [phone]  Claudine Mouton 06/15/2016, 10:20 AM

## 2016-06-15 NOTE — Progress Notes (Signed)
PROGRESS NOTE  Omar [name]  ZOX:[id number] DOB: [birth year] DOA: 05/23/2016 PCP: Redge Gainer, MD  Brief Narrative:   71 y/o male from [place] admitted 3/5 with altered mental status + H.flu PNA with aspiration, sepsis, hypoxic/hypercapnic respiratory failure. Course c/b retroperitoneal bleed while on heparin with subsequent shock, acute renal failure & prolonged ventilation requiring tracheostomy.  On CRRT intermittently through hosp and has not tolerated HD due to hypotension.  He has had a tunneled HD catheter placed.  He continues to have significant hypotension preventing placement of permanent feeding tube.  Continuing to have SLP reevaluate.   Started on dys I diet per SLP 4/10.  Palliative care is following.  Needs surgical G-tube placement once blood pressures have improved enough to tolerate anesthesia, then discharge to LTAC.    Antibiotics: Zosyn 3/06 >> 3/08 Rocephin (H.Flu PNA) 3/08 >> 3/14 Vancomycin 3/23 >> 3/28 Cefepime 3/23 >> 4/1 Meropenem 4/1 >> Vancomycin 4/3 >> 4/4  Cultures: Urine 3/05 >> negative Blood 3/06 >> negative Resp 3/6 >> H flu (b-lactamase +) Trach stoma site 3/23 >> diphtheroids Resp cx 3/28 >>nl flora Blood 4/1 NGTD  Lines/tubes: ETT 3/05 >> 3/8 ETT 3/10 >> 3/21 Trach (Dr Redmond Baseman) 3/21 >>  Lt IJ CVL 3/07 >> 3/27 R HD catheter  >> 3/22 R IJ HD catheter 3/23 >>   Events: 3/05  Transfer to [facility] Sputum 3/06 >> Haemophilus influenzae (beta lactamase positive) Echo 3/06 >> EF 65 to 70%, mod LVH, mod TR, PAS 39 mmHg CT head 3/7 >> no acute abnormality  3/10 - Large retroperitoneal bleed noted on CT after c/o back pain/leg pain 3/27 rt humerus fracture identified - ortho consult 3/27 RUQ [place] .Marland Kitchen Not helpful due to body habitus. On ATC with copious secrtions.  3/29 - Chronic  critically ill 3/27 - end CRRT 3/30 - doing ATC x 24-48h per RN. Rt shoulder pain only when touched per RN. Making urine. Off CRRT. Not on pressors. Family not fully on board  about LTAC per RN but looking at [facility]. Needs LTAC.  4/2 - resumed CRRT 4/3 - Family agreeable to PEG tube 4/5 - G-tube to be placed by gen surg however anesthesia refused 2/2 hypotension.  4/6 - restarted HD. Tolerated.  4/7 - transferred to SDU 4/8 - tunneled HD catheter by IR 4/9 - recurrent sepsis/fevers 4/10 - shingles outbreak  Assessment & Plan:    Active Problems:   Paroxysmal atrial fibrillation (HCC)   Hemiparesis affecting right side as late effect of cerebrovascular accident [facility])   Hypothyroidism   Acute respiratory failure with hypoxemia (HCC)   Shock circulatory (HCC)   Altered mental status   Retroperitoneal hemorrhage   Goals of care, counseling/discussion   On enteral nutrition   Acute renal failure (ARF) (HCC)   Electrolyte imbalance   Shock liver   Current use of proton pump inhibitor   Contraindication to deep vein thrombosis (DVT) prophylaxis   Hepatic lesion   Palliative care encounter   Acute respiratory failure with hypercapnia (HCC)   AKI (acute kidney injury) ([place])   Stage 5 chronic kidney disease on chronic dialysis ([place])   HCAP (healthcare-associated pneumonia)   Palliative care by specialist   Tracheostomy care ([place])   Pressure injury of skin  Acute on chronic hypoxemic respiratory failure, improved.   Pneumonia, H influenza (beta lactamase positive), treated New HCAP on 4/1, treated Pulm edema, improved Presumed OSA/OHS History of asthma Cont ATC, keep o2 sats > 88% Off vent since 4/2  duoneb qid + pulmicort BID Completed a course of meropenem on 4/8 for HCAP Follow up with ENT and trach clinic outpatient Hopefully can dc antibiotics 4/13 if culture data negative  Septic and hemorrhagic shock, resolved. Off pressors 3/31 but had been intermittently hypotensive, slowly improving Paroxysmal atrial fibrillation History of hypertension Off stress dose steroids, continue to hold anti-hypertensives. Continue midodrine to 30m TID (started  on 4/5) Blood cultures 4/10: NGTD CXR:  No pneumonia Tracheal aspirate culture pending Varicella zoster PCR positive Airborne precautions  Vesicular/bullous rash on right chest, consider metal allergy from the sling he has been wearing.  DDx also include shingles.  T4 dermatome and has a large ulcerated area on his back in same dermatomal distribution -  Varicella PCR positive -  Cover metal ring with tape -  Airborne precautions - started acyclovir 4/11  New ESRD, Acute renal failure, presumed due to ATN in the setting of hemorrhagic shock. Pt intermittently on CRRT and  Currently on HD since 4/6 HD MWF Midodrine increasedwith better blood pressures Tunneled HD catheter placed on 4/9 by IR  Large retroperitoneal bleed noted 3/10, some slight increased 3/18 CT scan. Hb has been stable.   Shock liver improving.  Hepatic lesion; noted on CT scan of the abdomen 3/18. 5 cm lesion of the dome of the liver, could represent blood or infection or infarct.  Protein calorie malnutrition Continue PPI Continue tube feeds for now PEG/G tube unable to be placed.  IR unable to place 2/2 complicated anatomy. Gen surg unable to place 2/2 hypotension. [facility] may be able to take patient and make arrangements for his PEG tube  Acute blood loss anemia superimposed on anemia of chronic kidney disease, improved Severe heparin sensitivity, associated with hemorrhage Thrombocytopenia, acute on chronic Thrombocytopenia improving Follow CBC, Goal hemoglobin greater than 7.0.  No recent bleeding or transfusion.  No heparin for now.  Hold anticoagulation  SCD for DVT prophylaxis  H. influenzae pneumonia and HCAP treatment completed Cefepime, started 3/23, [facility] for possible [facility]CAP.  Completed meropenem on 4/8.  Cultures (-) so far.   Hyperglycemia, on steroids, resolved Hypothyroidism, stable, continue synthroid  Metabolic uremic encephalopathy, improved.  History of  CVA with right hemiplegia Rt humerus fracture  Mentation improving PT consult - LTAC recommended.  RUE sling Morphine prn pain  FAMILY DISCUSSIONS:   Family OK with PEG tube as of 4/3 but unfortunately pt could not tolerate anesthesia.  Dr. DCorrie Dandyspoke with granddaughter on 4/8 and updated her of pts condition that he was transferred to SDU.   [facility] and patient is on list if bed becomes available. Plan for LTAC     DVT prophylaxis:  SCDs Code Status:  full Family Communication:  Patient alone [facility]  [facility]  Consultants:   General surgery  Nephrology  ENT  Orthopedic surgery, Dr. JJacqulynn Cadet Palliative care  Interventional radiology   Antimicrobials:  Anti-infectives    Start     Dose/Rate Route Frequency Ordered Stop   06/15/16 2200  acyclovir (ZOVIRAX) 200 MG capsule 200 mg     200 mg Oral 2 times daily 06/15/16 0801     06/15/16 0900  cefTAZidime (FORTAZ) 2 g in dextrose 5 % 50 mL IVPB     2 g 100 mL/hr over 30 Minutes Intravenous  Once 06/15/16 0720     06/15/16 0900  acyclovir (ZOVIRAX) 200 MG capsule 400 mg  400 mg Oral  Once 06/15/16 0801     06/15/16 0000  acyclovir (ZOVIRAX) 200 MG capsule 200 mg  Status:  Discontinued     200 mg Oral 2 times daily 06/14/16 2353 06/15/16 0801   06/14/16 2200  acyclovir (ZOVIRAX) tablet 200 mg  Status:  Discontinued     200 mg Oral 2 times daily 06/14/16 1533 06/14/16 2353   06/14/16 1800  cefTAZidime (FORTAZ) 2 g in dextrose 5 % 50 mL IVPB     2 g 100 mL/hr over 30 Minutes Intravenous Every M-W-F (Hemodialysis) 06/13/16 1338     06/14/16 1600  acyclovir (ZOVIRAX) tablet 400 mg  Status:  Discontinued     400 mg Oral  Once 06/14/16 1533 06/15/16 0801   06/14/16 1200  vancomycin (VANCOCIN) IVPB 1000 mg/200 mL premix     1,000 mg 200 mL/hr over 60 Minutes Intravenous Every M-W-F (Hemodialysis) 06/13/16 1338     06/13/16 1300  vancomycin (VANCOCIN) 2,000  mg in sodium chloride 0.9 % 500 mL IVPB     2,000 mg 250 mL/hr over 120 Minutes Intravenous  Once 06/13/16 1244 06/13/16 1645   06/13/16 1300  cefTAZidime (FORTAZ) 2 g in dextrose 5 % 50 mL IVPB     2 g 100 mL/hr over 30 Minutes Intravenous  Once 06/13/16 1244 06/13/16 1506   06/12/16 1448  ceFAZolin (ANCEF) 2-4 GM/100ML-% IVPB  Status:  Discontinued    Comments:  Shaaron Adler   : cabinet override      06/12/16 1448 06/12/16 1454   06/12/16 1432  ceFAZolin (ANCEF) 2-4 GM/100ML-% IVPB    Comments:  Laughlin, Amy   : cabinet override      06/12/16 1432 06/12/16 1436   06/04/2016 0900  meropenem (MERREM) 500 mg in sodium chloride 0.9 % 50 mL IVPB     500 mg 100 mL/hr over 30 Minutes Intravenous Every 24 hours 06/07/16 0955 06/11/16 1109   07/02/2016 0600  cefoTEtan (CEFOTAN) 2 g in dextrose 5 % 50 mL IVPB  Status:  Discontinued     2 g 100 mL/hr over 30 Minutes Intravenous To [facility] 06/07/16 1354 06/30/2016 1157   06/06/16 1200  vancomycin (VANCOCIN) 1,500 mg in sodium chloride 0.9 % 500 mL IVPB  Status:  Discontinued     1,500 mg 250 mL/hr over 120 Minutes Intravenous Every 24 hours 06/06/16 0818 06/07/16 0954   06/05/16 1000  meropenem (MERREM) 1 g in sodium chloride 0.9 % 100 mL IVPB  Status:  Discontinued     1 g 200 mL/hr over 30 Minutes Intravenous Every 12 hours 06/05/16 0926 06/07/16 0955   06/04/16 1400  vancomycin (VANCOCIN) 2,000 mg in sodium chloride 0.9 % 500 mL IVPB     2,000 mg 250 mL/hr over 120 Minutes Intravenous  Once 06/04/16 1234 06/04/16 1737   06/04/16 1400  meropenem (MERREM) 500 mg in sodium chloride 0.9 % 50 mL IVPB  Status:  Discontinued     500 mg 100 mL/hr over 30 Minutes Intravenous Every 24 hours 06/04/16 1234 06/05/16 0926   05/30/16 2300  ceFEPIme (MAXIPIME) 1 g in dextrose 5 % 50 mL IVPB  Status:  Discontinued     1 g 100 mL/hr over 30 Minutes Intravenous Every 24 hours 05/30/16 1223 06/04/16 1231   05/27/16 1400  ceFEPIme (MAXIPIME) 2 g in  dextrose 5 % 50 mL IVPB  Status:  Discontinued     2 g 100 mL/hr over 30 Minutes Intravenous Every 12  hours 05/27/16 1014 05/30/16 1223   05/27/16 1400  vancomycin (VANCOCIN) 1,500 mg in sodium chloride 0.9 % 500 mL IVPB  Status:  Discontinued     1,500 mg 250 mL/hr over 120 Minutes Intravenous Every 24 hours 05/27/16 1014 05/30/16 1224   05/26/16 1300  ceFEPIme (MAXIPIME) 1 g in dextrose 5 % 50 mL IVPB  Status:  Discontinued     1 g 100 mL/hr over 30 Minutes Intravenous Every 24 hours 05/26/16 1225 05/27/16 1014   05/26/16 1230  vancomycin (VANCOCIN) 2,500 mg in sodium chloride 0.9 % 500 mL IVPB     2,500 mg 250 mL/hr over 120 Minutes Intravenous  Once 05/26/16 1225 05/26/16 1530   05/16/16 1400  cefTRIAXone (ROCEPHIN) 2 g in dextrose 5 % 50 mL IVPB     2 g 100 mL/hr over 30 Minutes Intravenous Every 24 hours 05/15/16 1649 05/17/16 1458   05/11/16 1400  cefTRIAXone (ROCEPHIN) 1 g in dextrose 5 % 50 mL IVPB  Status:  Discontinued     1 g 100 mL/hr over 30 Minutes Intravenous Every 24 hours 05/11/16 0833 05/15/16 1649   05/09/16 1400  piperacillin-tazobactam (ZOSYN) IVPB 3.375 g  Status:  Discontinued     3.375 g 12.5 mL/hr over 240 Minutes Intravenous Every 8 hours 05/09/16 1114 05/11/16 0833     Subjective:  Patient able to nod and shake his head. Denies pain, difficulty breathing, nausea.  Objective: Vitals:   06/14/16 2249 06/15/16 0051 06/15/16 0425 06/15/16 0500  BP:  (!) 96/55  (!) 86/52  Pulse: (!) 104 (!) 102 (!) 112 (!) 107  Resp: 20 17 (!) 22 (!) 21  Temp:  (!) 100.5 F (38.1 C)  100.1 F (37.8 C)  TempSrc:  Oral  Oral  SpO2: 98% 100% 100% 95%  Weight:      Height:        Intake/Output Summary (Last 24 hours) at 06/15/16 0814 Last data filed at 06/15/16 0700  Gross per 24 hour  Intake          1366.67 ml  Output             1850 ml  Net          -483.33 ml   Filed Weights   06/07/16 0415  Weight: (!) 154.2 kg (340 lb)    Examination:  General exam:   Morbidly obese male, trach collar in place with rhonchorous breath sounds, No acute distress.  HEENT:  MMM, trach collar in place Respiratory system:  no focal rales or wheezes Cardiovascular system: tachycardic, normal S1/S2. No murmurs, rubs, gallops or clicks.  Warm extremities Gastrointestinal system: Hyperactive bowel sounds, soft, nondistended, nontender. MSK:  Decreased bulk and strength.  Right arm in sling.  2+ pitting bilateral lower extremity edema Neuro:  3 out of 5 strength of the right upper and right lower extremities, 4-5 strength left upper and lower extremities Skin:  Herpetic rash on right chest wall and back. Has a large ulcerated area on his back in same distribution.  No vesicles under right arm, however.  Base of lesions is not particularly erythematous.   GU:  Urine in foley is dark brown.   Data Reviewed: I have personally reviewed following labs and imaging studies  CBC:  Recent Labs Lab 06/10/16 0420 06/11/16 0500 06/12/16 0417 06/14/16 0535 06/15/16 0255  WBC 5.1 4.1 4.7 4.4 4.5  HGB 7.6* 7.8* 8.1* 8.0* 7.9*  HCT 24.6* 25.7* 26.1* 25.0* 24.3*  MCV 109.8*  110.8* 110.6* 111.1* 111.0*  PLT 67* 78* 103* 130* 025*   Basic Metabolic Panel:  Recent Labs Lab 06/11/16 0500 06/12/16 0417 06/13/16 0415 06/14/16 0535 06/15/16 0255  NA 133*  133* 131* 132* 133* 132*  K 4.0  4.0 4.3 3.7 4.0 4.2  CL 96*  96* 96* 96* 98* 100*  CO2 _0 GLUCOSE 96  95 105* 112* 105* 106*  BUN 44*  44* 69* 42* 70* 45*  CREATININE 2.53*  2.52* 3.95* 3.16* 4.40* 3.56*  CALCIUM 7.9*  8.0* 8.0* 7.9* 7.9* 7.7*  PHOS 4.5 6.7* 5.1* 6.2* 5.3*   GFR: Estimated Creatinine Clearance: 26.5 mL/min (A) (by C-G formula based on SCr of 3.56 mg/dL (H)). Liver Function Tests:  Recent Labs Lab 06/11/16 0500 06/12/16 0417 06/13/16 0415 06/14/16 0535 06/15/16 0255  ALBUMIN 1.7* 1.9* 1.7* 1.7* 1.7*   No results for input(s): LIPASE, AMYLASE in the last 168  hours. No results for input(s): AMMONIA in the last 168 hours. Coagulation Profile:  Recent Labs Lab 06/12/16 0417  INR 1.27   Cardiac Enzymes: No results for input(s): CKTOTAL, CKMB, CKMBINDEX, TROPONINI in the last 168 hours. BNP (last 3 results) No results for input(s): PROBNP in the last 8760 hours. HbA1C: No results for input(s): HGBA1C in the last 72 hours. CBG:  Recent Labs Lab 06/14/16 1213 06/14/16 1600 06/14/16 2104 06/14/16 2335 06/15/16 0512  GLUCAP 98 101* 86 98 117*   Lipid Profile: No results for input(s): CHOL, HDL, LDLCALC, TRIG, CHOLHDL, LDLDIRECT in the last 72 hours. Thyroid Function Tests: No results for input(s): TSH, T4TOTAL, FREET4, T3FREE, THYROIDAB in the last 72 hours. Anemia Panel: No results for input(s): VITAMINB12, FOLATE, FERRITIN, TIBC, IRON, RETICCTPCT in the last 72 hours. Urine analysis:    Component Value Date/Time   COLORURINE AMBER (A) 06/13/2016 0808   APPEARANCEUR CLOUDY (A) 06/13/2016 0808   LABSPEC 1.023 06/13/2016 0808   PHURINE 6.0 06/13/2016 0808   GLUCOSEU NEGATIVE 06/13/2016 0808   HGBUR LARGE (A) 06/13/2016 0808   BILIRUBINUR NEGATIVE 06/13/2016 0808   BILIRUBINUR small 10/06/2014 1612   KETONESUR NEGATIVE 06/13/2016 0808   PROTEINUR 100 (A) 06/13/2016 0808   UROBILINOGEN 2.0 10/06/2014 1612   UROBILINOGEN 4.0 (H) 05/30/2010 1511   NITRITE NEGATIVE 06/13/2016 0808   LEUKOCYTESUR MODERATE (A) 06/13/2016 0808    Recent Results (from the past 240 hour(s))  MRSA PCR Screening     Status: Abnormal   Collection Time: 06/11/16  4:18 AM  Result Value Ref Range Status   MRSA by PCR INVALID RESULTS, SPECIMEN SENT FOR CULTURE (A) NEGATIVE Final    Comment: NOTIFIED RN JENNIFER AT [id number] 06/11/16 BY A.DAVIS  MRSA culture     Status: None   Collection Time: 06/11/16  4:18 AM  Result Value Ref Range Status   Specimen Description NASAL SWAB  Final   Special Requests NONE  Final   Culture NO MRSA DETECTED  Final   Report  Status 06/14/2016 FINAL  Final  Culture, Urine     Status: Abnormal   Collection Time: 06/13/16  8:08 AM  Result Value Ref Range Status   Specimen Description URINE, RANDOM  Final   Special Requests Normal  Final   Culture >=100,000 COLONIES/mL YEAST (A)  Final   Report Status 06/14/2016 FINAL  Final  Culture, respiratory (NON-Expectorated)     Status: None (Preliminary result)   Collection Time: 06/13/16  9:04 AM  Result Value Ref Range Status  Specimen Description TRACHEAL ASPIRATE  Final   Special Requests Normal  Final   Gram Stain   Final    MODERATE WBC PRESENT,BOTH PMN AND MONONUCLEAR FEW GRAM POSITIVE COCCI IN PAIRS IN CLUSTERS RARE GRAM NEGATIVE RODS    Culture CULTURE REINCUBATED FOR BETTER GROWTH  Final   Report Status PENDING  Incomplete  Culture, blood (Routine X 2) w Reflex to ID Panel     Status: None (Preliminary result)   Collection Time: 06/13/16  9:44 AM  Result Value Ref Range Status   Specimen Description BLOOD LEFT ARM  Final   Special Requests IN PEDIATRIC BOTTLE Blood Culture adequate volume  Final   Culture NO GROWTH < 24 HOURS  Final   Report Status PENDING  Incomplete  Culture, blood (Routine X 2) w Reflex to ID Panel     Status: None (Preliminary result)   Collection Time: 06/13/16 10:49 AM  Result Value Ref Range Status   Specimen Description BLOOD LEFT HAND  Final   Special Requests IN PEDIATRIC BOTTLE BCAV  Final   Culture NO GROWTH < 24 HOURS  Final   Report Status PENDING  Incomplete    Radiology Studies: No results found. Scheduled Meds: . acyclovir  200 mg Oral BID  . acyclovir  400 mg Oral Once  . anticoagulant sodium citrate  10 mL Intravenous Once  . budesonide (PULMICORT) nebulizer solution  0.5 mg Nebulization BID  . cefTAZidime (FORTAZ)  IV  2 g Intravenous Q M,W,F-HD  . cefTAZidime (FORTAZ)  IV  2 g Intravenous Once  . chlorhexidine gluconate (MEDLINE KIT)  15 mL Mouth Rinse BID  . [START ON Jul 09, 2016] darbepoetin (ARANESP)  injection - DIALYSIS  200 mcg Intravenous Q Wed-HD  . feeding supplement (OSMOLITE 1.5 CAL)  1,000 mL Per Tube Q24H  . feeding supplement (PRO-STAT SUGAR FREE 64)  60 mL Per Tube TID  . insulin aspart  0-9 Units Subcutaneous Q4H  . ipratropium-albuterol  3 mL Nebulization TID  . levothyroxine  125 mcg Per Tube QAC breakfast  . mouth rinse  15 mL Mouth Rinse QID  . midodrine  15 mg Per Tube TID WC  . multivitamin  1 tablet Oral QHS  . pantoprazole sodium  40 mg Per Tube Daily  . vancomycin  1,000 mg Intravenous Q M,W,F-HD   Continuous Infusions: . sodium chloride 10 mL/hr at 06/03/16 2000  . sodium chloride Stopped (06/06/16 0700)     LOS: 38 days   Time spent: 36 min  Irwin Brakeman, MD [facility] Pager [phone]  If 7PM-7AM, please contact night-coverage [email or web address] Password [email or web address] 06/15/2016, 8:14 AM

## 2016-06-15 NOTE — Progress Notes (Signed)
Nutrition Follow-up  DOCUMENTATION CODES:   Morbid obesity  INTERVENTION:   -Continue Osmolite 1.5 @ 40 ml/hr via cortrak tube with 60 ml Prostat TID Tube feeding regimen provides provides 2040kcal, 150gm protein, 745m free water daily, which meets 100% of estimated kcal and protein needs.  -Continue rena-vit -RD will follow for diet advancement and PO intake  NUTRITION DIAGNOSIS:   Inadequate oral intake related to dysphagia as evidenced by meal completion < 25% (TF dependent).  Progressing  GOAL:   Patient will meet greater than or equal to 90% of their needs  Met with TF  MONITOR:   PO intake, Diet advancement, Labs, Weight trends, Skin, I & O's, TF tolerance  REASON FOR ASSESSMENT:   Consult Enteral/tube feeding initiation and management  ASSESSMENT:   71y/o male from [facility] admitted 3/5 with altered mental status with aspiration, sepsis, hypoxic/hypercapnic respiratory failure.    [facility] Large retroperitoneal bleed noted on CT after c/o back pain/leg pain 3/27 rt humerus fracture identified 3/27- end CRRT 3/30 - doing ATC x 24-48h per RN, making urine, off CRRT 4/2 - resumed CRRT 4/3 - Family agreeable to PEG tube 4/5 - G-tube to be placed by gen surg however anesthesia refused 2/2 hypotension.  4/6 - restarted HD. Tolerated.  4/7 - transferred to SDU 4/8 - tunneled HD catheter by IR 4/9 - recurrent sepsis/fevers 4/10 - shingles outbreak  Pt working with respiratory therapy at time of visit.   Pt underwent FEES on 06/13/16 and was upgraded to dysphagia 1 diet with honey thick liquids. Pt advanced to dysphagia 2 diet with nectar thick liquids on 06/14/16 due to increased alertness and for increased food variety. Intake is minimal; noted 10% meal completion.   Pt remains on TF via cortrak tube. Pt currently receiving Osmolite 1.5 @ 40 ml/hr with Prostat 60 ml TID, which provides 2040kcal, 150gm protein, 7375mfree water daily, which  meets 100% of estimated kcal and protein needs.   Per chart review, plan to transfer to SNF vs LTACH.   Labs reviewed: NA: 132 (on IV supplementation), Phos: 5.3  Diet Order:  DIET DYS 2 Room service appropriate? Yes; Fluid consistency: Nectar Thick  Skin:  Reviewed, no issues  Last BM:  06/14/16  Height:   Ht Readings from Last 1 Encounters:  06/14/16 _0  (1.676 m)    Weight:   Wt Readings from Last 1 Encounters:  03/10/09 (!) 350 lb (158.8 kg)    Ideal Body Weight:  64.5 kg  BMI:  Body mass index is 54.88 kg/m.  Estimated Nutritional Needs:   Kcal:  1900-2100  Protein:  145-160 grams  Fluid:  >2.0 L  EDUCATION NEEDS:   No education needs identified at this time  Omar Wallick A. WiJimmye NormanRD, LDN, CDE Pager: [phone] hours Pager: [phone]

## 2016-06-15 NOTE — Care Management Note (Addendum)
Case Management Note Previous CM note initiated by Donn Pierini 05/30/16 Previous CM note initiated by Cherylann Parr, RN 05/10/2016, 2:22 PM   Patient Details  Name: Omar [name] MRN: [id number] Date of Birth: May 03, 1945  Subjective/Objective: Pt admitted for acute resp failure - on ventilator                   Action/Plan:  PTA from home with daughter and grandkids.  Pt is wheelchair bound at home.  CM will continue to follow for discharge needs   Expected Discharge Date:                  Expected Discharge Plan:  Long Term Acute Care (LTAC)  In-House Referral:  Clinical Social Work  Discharge planning Services  CM Consult  Post Acute Care Choice:    Choice offered to:     DME Arranged:    DME Agency:     HH Arranged:    HH Agency:     Status of Service:  In process, will continue to follow  If discussed at Long Length of Stay Meetings, dates discussed:  3/27  Additional Comments: 06/15/2016 11:46 Pt was transferred from 4E overnight to 4Np SD - pt now shingles and needed pressure neg room  06/14/16 Pt now off CRRT - attempting IHD today (schedule not established).  Pt was unable to have G tube placed.  PC meeting with family tomorrow to discuss GOC.  Family are interested in [facility] based on location at discharge however were made aware that it will depend on bed availability - both agencies continue to follow pt - barrier remains HD plan  05/31/16 CM spoke with POA and daughter regarding recommendation of LTACH discharge.  CM provided choice of facilities - family will discuss recommendation of discharge plan.  Both facilites will offer bed pending bed availability - also the lack of a definite HD plan is a barrier to discharge  05/30/16 Raynald Blend, RN, BSN [phone] [facility] referral agreed to by attending.  LTACH referral given to both agencies - CM awaiting response.  Pt is now off CRRT and renal will assess daily for IHD.    05/30/16- 0830- Donn Pierini RN, CM- discussed case with DR. A- ok for referral to both LTACHs- s/p trach- pt weaning vent- on TC during day, restarted CVVHD on 3/24, continued TF, IV lasix, weaning Levophed. Reported off to unit CM- Raynald Blend who will f/u on [facility] referrals.  Remains appropriate for continued stay.    Cherylann Parr, RN 05/06/2016, 2:22 PM--Pt going for trach today.  Pt will have 24 hour CRRT holiday starting today and will resume tomorrow  05/23/16 Discussed in LOS 05/23/16 - remains appropriate for continued stay   05/22/16  Pt remains ventilated - not weaning well, remains on CRRT.  Plan is for possible trach this week  05/18/16 Discussed in LOS 05/18/16 - remains appropriate for continued stay.  Remains on ventilator   Cherylann Parr, RN 06/15/2016, 10:45 AM [phone]

## 2016-06-15 NOTE — Progress Notes (Signed)
Assessment/Plan: 1 AKI oliguric. S/P [facility] TDC 2 Resp failure with trach  3 Massive obesity 4 CVA 5 Arm fx 6 RP bleed 7 DM 8 Anemia esa, /Fe 9 low ptlt 10 afib, low BP 11 Nutrition needs PEG 12 HZV P HD FRI  Subjective: Interval History: HZV on acyclovir  Objective: Vital signs in last 24 hours: Temp:  [98.6 F (37 C)-100.5 F (38.1 C)] 99.9 F (37.7 C) (04/12 0939) Pulse Rate:  [89-113] 109 (04/12 1011) Resp:  [17-34] 23 (04/12 1011) BP: (80-171)/(44-111) 100/67 (04/12 1011) SpO2:  [95 %-100 %] 100 % (04/12 1012) FiO2 (%):  [28 %-30 %] 28 % (04/12 1012) Weight change:   Intake/Output from previous day: 04/11 0701 - 04/12 0700 In: 1366.7 [NG/GT:1166.7; IV Piggyback:200] Out: 1850  Intake/Output this shift: No intake/output data recorded.  large male with trach and significant adiposity alert and appropriate  Lab Results:  Recent Labs  06/14/16 0535 06/15/16 0255  WBC 4.4 4.5  HGB 8.0* 7.9*  HCT 25.0* 24.3*  PLT 130* 115*   BMET:  Recent Labs  06/14/16 0535 06/15/16 0255  NA 133* 132*  K 4.0 4.2  CL 98* 100*  CO2 28 24  GLUCOSE 105* 106*  BUN 70* 45*  CREATININE 4.40* 3.56*  CALCIUM 7.9* 7.7*   No results for input(s): PTH in the last 72 hours. Iron Studies: No results for input(s): IRON, TIBC, TRANSFERRIN, FERRITIN in the last 72 hours. Studies/Results: No results found.  Scheduled: . acyclovir  200 mg Oral Daily  . acyclovir  200 mg Oral Once per day on Sun Tue Thu Sat  . [START ON 06/16/2016] acyclovir  600 mg Oral Once per day on Mon Wed Fri  . acyclovir  400 mg Oral Once  . anticoagulant sodium citrate  10 mL Intravenous Once  . budesonide (PULMICORT) nebulizer solution  0.5 mg Nebulization BID  . cefTAZidime (FORTAZ)  IV  2 g Intravenous Q M,W,F-HD  . ceftAZIDime (FORTAZ) 2 g IVPB  2 g Intravenous Once  . chlorhexidine gluconate (MEDLINE KIT)  15 mL Mouth Rinse BID  . [START ON 06/11/2016] darbepoetin (ARANESP) injection - DIALYSIS   200 mcg Intravenous Q Wed-HD  . feeding supplement (OSMOLITE 1.5 CAL)  1,000 mL Per Tube Q24H  . feeding supplement (PRO-STAT SUGAR FREE 64)  60 mL Per Tube TID  . insulin aspart  0-9 Units Subcutaneous Q4H  . ipratropium-albuterol  3 mL Nebulization TID  . levothyroxine  125 mcg Per Tube QAC breakfast  . mouth rinse  15 mL Mouth Rinse QID  . midodrine  15 mg Per Tube TID WC  . multivitamin  1 tablet Oral QHS  . pantoprazole sodium  40 mg Per Tube Daily  . vancomycin  1,000 mg Intravenous Q M,W,F-HD     LOS: 38 days   , C 06/15/2016,12:08 PM   

## 2016-06-16 ENCOUNTER — Inpatient Hospital Stay (HOSPITAL_COMMUNITY): Payer: Medicare Other

## 2016-06-16 LAB — CBC
HEMATOCRIT: 26 % — AB (ref 39.0–52.0)
Hemoglobin: 8.2 g/dL — ABNORMAL LOW (ref 13.0–17.0)
MCH: 35 pg — ABNORMAL HIGH (ref 26.0–34.0)
MCHC: 31.5 g/dL (ref 30.0–36.0)
MCV: 111.1 fL — ABNORMAL HIGH (ref 78.0–100.0)
PLATELETS: 160 10*3/uL (ref 150–400)
RBC: 2.34 MIL/uL — ABNORMAL LOW (ref 4.22–5.81)
RDW: 22.9 % — AB (ref 11.5–15.5)
WBC: 5.3 10*3/uL (ref 4.0–10.5)

## 2016-06-16 LAB — GLUCOSE, CAPILLARY
GLUCOSE-CAPILLARY: 104 mg/dL — AB (ref 65–99)
GLUCOSE-CAPILLARY: 84 mg/dL (ref 65–99)
GLUCOSE-CAPILLARY: 87 mg/dL (ref 65–99)
GLUCOSE-CAPILLARY: 93 mg/dL (ref 65–99)
Glucose-Capillary: 113 mg/dL — ABNORMAL HIGH (ref 65–99)
Glucose-Capillary: 104 mg/dL — ABNORMAL HIGH (ref 65–99)
Glucose-Capillary: 104 mg/dL — ABNORMAL HIGH (ref 65–99)

## 2016-06-16 LAB — RENAL FUNCTION PANEL
ALBUMIN: 1.8 g/dL — AB (ref 3.5–5.0)
Anion gap: 7 (ref 5–15)
BUN: 65 mg/dL — AB (ref 6–20)
CO2: 25 mmol/L (ref 22–32)
CREATININE: 4.83 mg/dL — AB (ref 0.61–1.24)
Calcium: 7.9 mg/dL — ABNORMAL LOW (ref 8.9–10.3)
Chloride: 98 mmol/L — ABNORMAL LOW (ref 101–111)
GFR calc Af Amer: 13 mL/min — ABNORMAL LOW (ref >60)
GFR, EST NON AFRICAN AMERICAN: 11 mL/min — AB (ref >60)
Glucose, Bld: 102 mg/dL — ABNORMAL HIGH (ref 65–99)
PHOSPHORUS: 5.9 mg/dL — AB (ref 2.5–4.6)
POTASSIUM: 4.8 mmol/L (ref 3.5–5.1)
SODIUM: 130 mmol/L — AB (ref 135–145)

## 2016-06-16 LAB — VANCOMYCIN, RANDOM: Vancomycin Rm: 28

## 2016-06-16 MED ORDER — VANCOMYCIN HCL IN DEXTROSE 1-5 GM/200ML-% IV SOLN: 1000.0000 mg | INTRAVENOUS | Status: DC

## 2016-06-16 MED ORDER — ACETAMINOPHEN 325 MG PO TABS
650.0000 mg | ORAL_TABLET | Freq: Four times a day (QID) | ORAL | Status: DC | PRN
  Filled 2016-06-16: qty 2

## 2016-06-16 MED ORDER — ACETAMINOPHEN 160 MG/5ML PO SOLN
650.0000 mg | Freq: Four times a day (QID) | ORAL | Status: DC | PRN
  Administered 2016-06-17 – 2016-06-20 (×4): 650 mg via ORAL
  Filled 2016-06-16 (×6): qty 20.3

## 2016-06-16 MED ORDER — DEXTROSE 5 % IV SOLN
2.0000 g | INTRAVENOUS | Status: DC
  Administered 2016-06-16: 2 g via INTRAVENOUS
  Filled 2016-06-16: qty 2

## 2016-06-16 MED ORDER — VANCOMYCIN HCL IN DEXTROSE 750-5 MG/150ML-% IV SOLN
750.0000 mg | INTRAVENOUS | Status: AC
  Administered 2016-06-16: 750 mg via INTRAVENOUS
  Filled 2016-06-16: qty 150

## 2016-06-16 MED ORDER — ACYCLOVIR 200 MG PO CAPS
200.0000 mg | ORAL_CAPSULE | Freq: Every day | ORAL | Status: DC
  Administered 2016-06-16: 200 mg via ORAL
  Filled 2016-06-16 (×2): qty 1

## 2016-06-16 NOTE — Progress Notes (Signed)
Attempted to pull out ngt and o2 , mitten applied to left hand.

## 2016-06-16 NOTE — Procedures (Signed)
Tol HD.  Prob will not reach goal due to soft BP. Promyse Ardito C

## 2016-06-16 NOTE — Progress Notes (Signed)
Pharmacy Antibiotic Note  Omar [name] is a 71 y.o. male admitted on 05/15/2016 with AMS. Now s/p multiple abx courses but has new fever and copious secretions from trach. Pharmacy has been consulted for vancomycin, acyclovir and ceftazidime dosing. WBC WNL but Tmax 101.4. Cultures remain negative.   New ESRD with MWF HD, unsure if outpatient HD candidate. Last HD session was 4/11 - 4.15 hours at BFR 200. Current HD in process with BFR 400- 4.5 hours planned.Pre-HD vanc level was 28.  Plan: Vancomycin 750 mg X 1 after HD today Then Vancomycin 1 gm Q MWF-HD starting Monday 4/16 Ceftazidime 2 gm  MWF at 2000 Azithromycin 200 mg every morning Azithromycin 200 mg at night TTSS Azithromycin 600 mg at night MWF Monitor clinical picture, HD sessions F/U C&S, abx deescalation / LOT   Height:  (167.6 cm) Weight:  (205 kg? (Bed Scale)) IBW/kg (Calculated) : 63.8  Temp (24hrs), Avg:100.2 F (37.9 C), Min:99.1 F (37.3 C), Max:101.4 F (38.6 C)   Recent Labs Lab 06/11/16 0500 06/12/16 0417 06/13/16 0415 06/14/16 0535 06/15/16 0255 06/16/16 0311 06/16/16 0830  WBC 4.1 4.7  --  4.4 4.5 5.3  --   CREATININE 2.53*  2.52* 3.95* 3.16* 4.40* 3.56* 4.83*  --   VANCORANDOM  --   --   --   --   --   --  28    Estimated Creatinine Clearance: 19.6 mL/min (A) (by C-G formula based on SCr of 4.83 mg/dL (H)).    Allergies  Allergen Reactions  . Anacin Af [Acetaminophen]   . Heparin     Retroperitoneal bleed in 2018, previous bleed from heparin in 2013  . Niaspan [Niacin Er]     Antimicrobials this admission: Zosyn 3/6 >> 3/8 CTX 3/8 >> 3/14 Cefepime 3/23 >> 4/1 Vancomycin 3/23 >> 3/28, 4/1 > 4/4; 4/10 Meropenem 4/1 >> 4/8 Ceftazidime 4/10 >>  Dose adjustments this admission:  3/28 VR = 37   4/3 VR = 23   4/13 VR= 28  Microbiology results: 3/6 MRSA PCR >> negative 3/6 Resp >> H. Influenzae, beta lactamase positive 4/1 Blood >> ngtd **All other cultures negative, including  4/1 recheck Neg hep B 4/8 MRSA swab: pending 4/10 TA Cx: few GPC in pairs and clusters and rare GNRs ; normal respiratory flora 4/10 BCx: NGTD 4/10 UCx: 100,000 colonies of yeast   Thank you for allowing pharmacy to be a part of this patient's care.  Hillis Range, PharmD PGY1 Pharmacy Resident Pager: [phone] 06/16/2016 11:14 AM

## 2016-06-16 NOTE — Progress Notes (Signed)
[facility] ([facility])   Subjective: Patient is receiving dialysis. Essentially nonresponsive currently. Nonverbal.    Objective: Temp:  [99.1 F (37.3 C)-101.4 F (38.6 C)] 100.6 F (38.1 C) (04/13 1300) Pulse Rate:  [96-126] 109 (04/13 1252) Resp:  [13-26] 17 (04/13 1252) BP: (85-130)/(56-98) 106/66 (04/13 1252) SpO2:  [99 %-100 %] 100 % (04/13 1252) FiO2 (%):  [28 %] 28 % (04/13 0930) Physical Exam Right arm is in a sling. No angulation appreciated. Radial pulse 2+. Guards against movement.  XRAYS: show excellent alignment maintained.   Assessment/Plan: 2 weeks out from discovered fracture of right proximal humerus.  1. Repeat xrays today 2. Range the right elbow as tolerated; although active motion of elbow and wrist/ hand would be beneficial so long as no active shoulder abduction, patient cannot perform these. 3. Re xray in another two weeks  Myrene Galas, MD [facility] [phone] (p)

## 2016-06-16 NOTE — Progress Notes (Signed)
PROGRESS NOTE  Omar [name]  BUL:[id number] DOB: [birth year] DOA: 05/22/2016 PCP: Redge Gainer, MD  Brief Narrative:   71 y/o male from [place] admitted 3/5 with altered mental status + H.flu PNA with aspiration, sepsis, hypoxic/hypercapnic respiratory failure. Course c/b retroperitoneal bleed while on heparin with subsequent shock, acute renal failure & prolonged ventilation requiring tracheostomy.  On CRRT intermittently through hosp and has not tolerated HD due to hypotension.  He has had a tunneled HD catheter placed.  He continues to have significant hypotension preventing placement of permanent feeding tube.  Continuing to have SLP reevaluate.   Started on dys I diet per SLP 4/10.  Palliative care is following.  Needs surgical G-tube placement once blood pressures have improved enough to tolerate anesthesia.    Antibiotics: Zosyn 3/06 >> 3/08 Rocephin (H.Flu PNA) 3/08 >> 3/14 Vancomycin 3/23 >> 3/28 Cefepime 3/23 >> 4/1 Meropenem 4/1 >> Vancomycin 4/3 >> 4/4  Cultures: Urine 3/05 >> negative Blood 3/06 >> negative Resp 3/6 >> H flu (b-lactamase +) Trach stoma site 3/23 >> diphtheroids Resp cx 3/28 >>nl flora Blood 4/1 NGTD  Lines/tubes: ETT 3/05 >> 3/8 ETT 3/10 >> 3/21 Trach (Dr Redmond Baseman) 3/21 >>  Lt IJ CVL 3/07 >> 3/27 R HD catheter  >> 3/22 R IJ HD catheter 3/23 >>   Events: 3/05  Transfer to [facility] Sputum 3/06 >> Haemophilus influenzae (beta lactamase positive) Echo 3/06 >> EF 65 to 70%, mod LVH, mod TR, PAS 39 mmHg CT head 3/7 >> no acute abnormality  3/10 - Large retroperitoneal bleed noted on CT after c/o back pain/leg pain 3/27 rt humerus fracture identified - ortho consult 3/27 RUQ [place] .Marland Kitchen Not helpful due to body habitus. On ATC with copious secrtions.  3/29 - Chronic  critically ill 3/27 - end CRRT 3/30 - doing ATC x 24-48h per RN. Rt shoulder pain only when touched per RN. Making urine. Off CRRT. Not on pressors. Family not fully on board about LTAC per RN but  looking at [place]. Needs LTAC.  4/2 - resumed CRRT 4/3 - Family agreeable to PEG tube 4/5 - G-tube to be placed by gen surg however anesthesia refused 2/2 hypotension.  4/6 - restarted HD. Tolerated.  4/7 - transferred to SDU 4/8 - tunneled HD catheter by IR 4/9 - recurrent sepsis/fevers 4/10 - shingles outbreak  Assessment & Plan:    Active Problems:   Paroxysmal atrial fibrillation (HCC)   Hemiparesis affecting right side as late effect of cerebrovascular accident [facility])   Hypothyroidism   Acute respiratory failure with hypoxemia (HCC)   Shock circulatory (HCC)   Altered mental status   Retroperitoneal hemorrhage   Goals of care, counseling/discussion   On enteral nutrition   Acute renal failure (ARF) (HCC)   Electrolyte imbalance   Shock liver   Current use of proton pump inhibitor   Contraindication to deep vein thrombosis (DVT) prophylaxis   Hepatic lesion   Palliative care encounter   Acute respiratory failure with hypercapnia (HCC)   AKI (acute kidney injury) ([place])   Stage 5 chronic kidney disease on chronic dialysis ([place])   HCAP (healthcare-associated pneumonia)   Palliative care by specialist   Tracheostomy care ([place])   Pressure injury of skin  Acute on chronic hypoxemic respiratory failure, improved.   Pneumonia, H influenza (beta lactamase positive), treated New HCAP on 4/1, treated Pulm edema, improved Presumed OSA/OHS History of asthma Cont ATC, keep o2 sats > 88% Off vent since 4/2 duoneb qid + pulmicort  BID Completed a course of meropenem on 4/8 for HCAP Follow up with ENT and trach clinic outpatient  Septic and hemorrhagic shock, resolved. Off pressors 3/31 but had been intermittently hypotensive, slowly improving Paroxysmal atrial fibrillation History of hypertension Off stress dose steroids, continue to hold anti-hypertensives. Continue midodrine to 3m TID (started on 4/5) Blood cultures 4/10: NGTD CXR:  No pneumonia Tracheal aspirate culture  pending Varicella zoster PCR positive Airborne precautions  Vesicular/bullous rash on right chest, consider metal allergy from the sling he has been wearing.  DDx also include shingles.  T4 dermatome and has a large ulcerated area on his back in same dermatomal distribution -  Varicella PCR positive -  Cover metal ring with tape -  Airborne precautions - started acyclovir 4/11  New ESRD, Acute renal failure, presumed due to ATN in the setting of hemorrhagic shock. Pt intermittently on CRRT and  Currently on HD since 4/6 HD MWF Midodrine increasedwith better blood pressures Tunneled HD catheter placed on 4/9 by IR  Large retroperitoneal bleed noted 3/10, some slight increased 3/18 CT scan. Hb has been stable.   Shock liver improving.  Hepatic lesion; noted on CT scan of the abdomen 3/18. 5 cm lesion of the dome of the liver, could represent blood or infection or infarct.  Protein calorie malnutrition Continue PPI Continue tube feeds for now PEG/G tube unable to be placed.  IR unable to place 2/2 complicated anatomy. Gen surg unable to place 2/2 hypotension. [facility] may be able to take patient and make arrangements for his PEG tube  Acute blood loss anemia superimposed on anemia of chronic kidney disease, improved Severe heparin sensitivity, associated with hemorrhage Thrombocytopenia, acute on chronic Thrombocytopenia improving Follow CBC, Goal hemoglobin greater than 7.0.  No recent bleeding or transfusion.  No heparin for now.  Hold anticoagulation  SCD for DVT prophylaxis  H. influenzae pneumonia and HCAP treatment completed Cefepime, started 3/23, [facility] possible [facility].  Completed meropenem on 4/8.  Cultures (-) so far.   Hyperglycemia, on steroids, resolved Hypothyroidism, stable, continue synthroid  Metabolic uremic encephalopathy, improved.  History of CVA with right hemiplegia Rt humerus fracture  Mentation improving PT consult -  LTAC recommended.  RUE sling Morphine prn pain  FAMILY DISCUSSIONS:   Family OK with PEG tube as of 4/3 but unfortunately pt could not tolerate anesthesia.  Dr. DCorrie Dandyspoke with granddaughter on 4/8 and updated her of pts condition that he was transferred to SDU.   [facility] if [facility] becomes available. Plan for LTAC     DVT prophylaxis:  SCDs Code Status:  full Family Communication:  Patient alone Disposition Plan:  LTAC  Consultants:   General surgery  Nephrology  ENT  Orthopedic surgery, Dr. JJacqulynn Cadet Palliative care  Interventional radiology   Antimicrobials:  Anti-infectives    Start     Dose/Rate Route Frequency Ordered Stop   06/16/16 2200  acyclovir (ZOVIRAX) tablet 600 mg  Status:  Discontinued     600 mg Oral Once per day on Mon Wed Fri 06/15/16 1126 06/15/16 1145   06/16/16 2200  acyclovir (ZOVIRAX) 200 MG capsule 600 mg     600 mg Oral Once per day on Mon Wed Fri 06/15/16 1443     06/16/16 2000  ceftAZIDime (FORTAZ) IVPB 2 g     2 g 100 mL/hr over 30 Minutes Intravenous Every M-W-F (2000) 06/15/16 1438  06/16/16 1000  acyclovir (ZOVIRAX) 200 MG capsule 600 mg  Status:  Discontinued     600 mg Oral Once per day on Mon Wed Fri 06/15/16 1145 06/15/16 1443   06/16/16 1000  acyclovir (ZOVIRAX) tablet 200 mg  Status:  Discontinued     200 mg Oral Daily 06/15/16 1435 06/16/16 0415   06/16/16 1000  acyclovir (ZOVIRAX) 200 MG capsule 200 mg     200 mg Oral Daily 06/16/16 0416     06/15/16 2200  acyclovir (ZOVIRAX) 200 MG capsule 200 mg  Status:  Discontinued     200 mg Oral 2 times daily 06/15/16 0801 06/15/16 1126   06/15/16 2200  acyclovir (ZOVIRAX) tablet 200 mg  Status:  Discontinued     200 mg Oral Once per day on Sun Tue Thu Sat 06/15/16 1126 06/15/16 1144   06/15/16 2200  acyclovir (ZOVIRAX) 200 MG capsule 200 mg     200 mg Oral Once per day on Sun Tue Thu Sat 06/15/16 1144     06/15/16 1200  acyclovir  (ZOVIRAX) 200 MG capsule 200 mg  Status:  Discontinued     200 mg Oral Daily 06/15/16 1141 06/15/16 1435   06/15/16 1130  acyclovir (ZOVIRAX) tablet 400 mg     400 mg Oral  Once 06/15/16 1117 06/15/16 1446   06/15/16 1130  Ceftazidime (FORTAZ) injection 2 g  Status:  Discontinued     2 g Intravenous  Once 06/15/16 1117 06/15/16 1118   06/15/16 1130  cefTAZidime (FORTAZ) 2 g in dextrose 5 % 50 mL IVPB     2 g 100 mL/hr over 30 Minutes Intravenous  Once 06/15/16 1122 06/15/16 1240   06/15/16 1130  acyclovir (ZOVIRAX) tablet 200 mg  Status:  Discontinued     200 mg Oral  Every morning - 10a 06/15/16 1126 06/15/16 1141   06/15/16 0900  cefTAZidime (FORTAZ) 2 g in dextrose 5 % 50 mL IVPB  Status:  Discontinued     2 g 100 mL/hr over 30 Minutes Intravenous  Once 06/15/16 0720 06/15/16 1117   06/15/16 0900  acyclovir (ZOVIRAX) 200 MG capsule 400 mg  Status:  Discontinued     400 mg Oral  Once 06/15/16 0801 06/15/16 1117   06/15/16 0000  acyclovir (ZOVIRAX) 200 MG capsule 200 mg  Status:  Discontinued     200 mg Oral 2 times daily 06/14/16 2353 06/15/16 0801   06/14/16 2200  acyclovir (ZOVIRAX) tablet 200 mg  Status:  Discontinued     200 mg Oral 2 times daily 06/14/16 1533 06/14/16 2353   06/14/16 1800  cefTAZidime (FORTAZ) 2 g in dextrose 5 % 50 mL IVPB  Status:  Discontinued     2 g 100 mL/hr over 30 Minutes Intravenous Every M-W-F (Hemodialysis) 06/13/16 1338 06/15/16 1438   06/14/16 1600  acyclovir (ZOVIRAX) tablet 400 mg  Status:  Discontinued     400 mg Oral  Once 06/14/16 1533 06/15/16 0801   06/14/16 1200  vancomycin (VANCOCIN) IVPB 1000 mg/200 mL premix     1,000 mg 200 mL/hr over 60 Minutes Intravenous Every M-W-F (Hemodialysis) 06/13/16 1338     06/13/16 1300  vancomycin (VANCOCIN) 2,000 mg in sodium chloride 0.9 % 500 mL IVPB     2,000 mg 250 mL/hr over 120 Minutes Intravenous  Once 06/13/16 1244 06/13/16 1645   06/13/16 1300  cefTAZidime (FORTAZ) 2 g in dextrose 5 % 50 mL IVPB       2 g  100 mL/hr over 30 Minutes Intravenous  Once 06/13/16 1244 06/13/16 1506   06/12/16 1448  ceFAZolin (ANCEF) 2-4 GM/100ML-% IVPB  Status:  Discontinued    Comments:  Shaaron Adler   : cabinet override      06/12/16 1448 06/12/16 1454   06/12/16 1432  ceFAZolin (ANCEF) 2-4 GM/100ML-% IVPB    Comments:  Laughlin, Amy   : cabinet override      06/12/16 1432 06/12/16 1436   06/04/2016 0900  meropenem (MERREM) 500 mg in sodium chloride 0.9 % 50 mL IVPB     500 mg 100 mL/hr over 30 Minutes Intravenous Every 24 hours 06/07/16 0955 06/11/16 1109   06/09/2016 0600  cefoTEtan (CEFOTAN) 2 g in dextrose 5 % 50 mL IVPB  Status:  Discontinued     2 g 100 mL/hr over 30 Minutes Intravenous To [facility] 06/07/16 1354 06/19/2016 1157   06/06/16 1200  vancomycin (VANCOCIN) 1,500 mg in sodium chloride 0.9 % 500 mL IVPB  Status:  Discontinued     1,500 mg 250 mL/hr over 120 Minutes Intravenous Every 24 hours 06/06/16 0818 06/07/16 0954   06/05/16 1000  meropenem (MERREM) 1 g in sodium chloride 0.9 % 100 mL IVPB  Status:  Discontinued     1 g 200 mL/hr over 30 Minutes Intravenous Every 12 hours 06/05/16 0926 06/07/16 0955   06/04/16 1400  vancomycin (VANCOCIN) 2,000 mg in sodium chloride 0.9 % 500 mL IVPB     2,000 mg 250 mL/hr over 120 Minutes Intravenous  Once 06/04/16 1234 06/04/16 1737   06/04/16 1400  meropenem (MERREM) 500 mg in sodium chloride 0.9 % 50 mL IVPB  Status:  Discontinued     500 mg 100 mL/hr over 30 Minutes Intravenous Every 24 hours 06/04/16 1234 06/05/16 0926   05/30/16 2300  ceFEPIme (MAXIPIME) 1 g in dextrose 5 % 50 mL IVPB  Status:  Discontinued     1 g 100 mL/hr over 30 Minutes Intravenous Every 24 hours 05/30/16 1223 06/04/16 1231   05/27/16 1400  ceFEPIme (MAXIPIME) 2 g in dextrose 5 % 50 mL IVPB  Status:  Discontinued     2 g 100 mL/hr over 30 Minutes Intravenous Every 12 hours 05/27/16 1014 05/30/16 1223   05/27/16 1400  vancomycin (VANCOCIN) 1,500 mg in sodium  chloride 0.9 % 500 mL IVPB  Status:  Discontinued     1,500 mg 250 mL/hr over 120 Minutes Intravenous Every 24 hours 05/27/16 1014 05/30/16 1224   05/26/16 1300  ceFEPIme (MAXIPIME) 1 g in dextrose 5 % 50 mL IVPB  Status:  Discontinued     1 g 100 mL/hr over 30 Minutes Intravenous Every 24 hours 05/26/16 1225 05/27/16 1014   05/26/16 1230  vancomycin (VANCOCIN) 2,500 mg in sodium chloride 0.9 % 500 mL IVPB     2,500 mg 250 mL/hr over 120 Minutes Intravenous  Once 05/26/16 1225 05/26/16 1530   05/16/16 1400  cefTRIAXone (ROCEPHIN) 2 g in dextrose 5 % 50 mL IVPB     2 g 100 mL/hr over 30 Minutes Intravenous Every 24 hours 05/15/16 1649 05/17/16 1458   05/11/16 1400  cefTRIAXone (ROCEPHIN) 1 g in dextrose 5 % 50 mL IVPB  Status:  Discontinued     1 g 100 mL/hr over 30 Minutes Intravenous Every 24 hours 05/11/16 0833 05/15/16 1649   05/09/16 1400  piperacillin-tazobactam (ZOSYN) IVPB 3.375 g  Status:  Discontinued     3.375 g 12.5 mL/hr over 240 Minutes  Intravenous Every 8 hours 05/09/16 1114 05/11/16 0833     Subjective:  Patient able to nod and shake his head. Denies pain, difficulty breathing, nausea.  Objective: Vitals:   06/15/16 1933 06/15/16 2010 06/16/16 0045 06/16/16 0344  BP:   (!) 99/56 93/60  Pulse: (!) 117  (!) 125 (!) 118  Resp: (!) 23  (!) 25 (!) 26  Temp:  (!) 100.7 F (38.2 C) 99.1 F (37.3 C) 99.1 F (37.3 C)  TempSrc:  Axillary Oral Oral  SpO2: 100% 100% 100% 99%  Weight:      Height:        Intake/Output Summary (Last 24 hours) at 06/16/16 4037 Last data filed at 06/16/16 0200  Gross per 24 hour  Intake              880 ml  Output              175 ml  Net              705 ml   Filed Weights   06/07/16 0415  Weight: (!) 154.2 kg (340 lb)    Examination:  General exam:  Morbidly obese male, trach collar in place with rhonchorous breath sounds, No acute distress.  HEENT:  MMM, trach collar in place Respiratory system:  no focal rales or  wheezes Cardiovascular system: tachycardic, normal S1/S2. No murmurs, rubs, gallops or clicks.  Warm extremities Gastrointestinal system: Hyperactive bowel sounds, soft, nondistended, nontender. MSK:  Decreased bulk and strength.  Right arm in sling.  2+ pitting bilateral lower extremity edema Neuro:  3 out of 5 strength of the right upper and right lower extremities, 4-5 strength left upper and lower extremities Skin:  Herpetic rash on right chest wall and back. Has a large ulcerated area on his back in same distribution.  No vesicles under right arm, however.  Base of lesions is not particularly erythematous.   GU:  Urine in foley is dark brown.   Data Reviewed: I have personally reviewed following labs and imaging studies  CBC:  Recent Labs Lab 06/11/16 0500 06/12/16 0417 06/14/16 0535 06/15/16 0255 06/16/16 0311  WBC 4.1 4.7 4.4 4.5 5.3  HGB 7.8* 8.1* 8.0* 7.9* 8.2*  HCT 25.7* 26.1* 25.0* 24.3* 26.0*  MCV 110.8* 110.6* 111.1* 111.0* 111.1*  PLT 78* 103* 130* 115* 543   Basic Metabolic Panel:  Recent Labs Lab 06/12/16 0417 06/13/16 0415 06/14/16 0535 06/15/16 0255 06/16/16 0311  NA 131* 132* 133* 132* 130*  K 4.3 3.7 4.0 4.2 4.8  CL 96* 96* 98* 100* 98*  CO2 '28 28 28 24 25  ' GLUCOSE 105* 112* 105* 106* 102*  BUN 69* 42* 70* 45* 65*  CREATININE 3.95* 3.16* 4.40* 3.56* 4.83*  CALCIUM 8.0* 7.9* 7.9* 7.7* 7.9*  PHOS 6.7* 5.1* 6.2* 5.3* 5.9*   GFR: Estimated Creatinine Clearance: 19.6 mL/min (A) (by C-G formula based on SCr of 4.83 mg/dL (H)). Liver Function Tests:  Recent Labs Lab 06/12/16 0417 06/13/16 0415 06/14/16 0535 06/15/16 0255 06/16/16 0311  ALBUMIN 1.9* 1.7* 1.7* 1.7* 1.8*   No results for input(s): LIPASE, AMYLASE in the last 168 hours. No results for input(s): AMMONIA in the last 168 hours. Coagulation Profile:  Recent Labs Lab 06/12/16 0417  INR 1.27   Cardiac Enzymes: No results for input(s): CKTOTAL, CKMB, CKMBINDEX, TROPONINI in the  last 168 hours. BNP (last 3 results) No results for input(s): PROBNP in the last 8760 hours. HbA1C: No results for input(s):  HGBA1C in the last 72 hours. CBG:  Recent Labs Lab 06/15/16 1354 06/15/16 1803 06/15/16 2009 06/16/16 0047 06/16/16 0505  GLUCAP 106* 94 100* 87 113*   Lipid Profile: No results for input(s): CHOL, HDL, LDLCALC, TRIG, CHOLHDL, LDLDIRECT in the last 72 hours. Thyroid Function Tests: No results for input(s): TSH, T4TOTAL, FREET4, T3FREE, THYROIDAB in the last 72 hours. Anemia Panel: No results for input(s): VITAMINB12, FOLATE, FERRITIN, TIBC, IRON, RETICCTPCT in the last 72 hours. Urine analysis:    Component Value Date/Time   COLORURINE AMBER (A) 06/13/2016 0808   APPEARANCEUR CLOUDY (A) 06/13/2016 0808   LABSPEC 1.023 06/13/2016 0808   PHURINE 6.0 06/13/2016 0808   GLUCOSEU NEGATIVE 06/13/2016 0808   HGBUR LARGE (A) 06/13/2016 0808   BILIRUBINUR NEGATIVE 06/13/2016 0808   BILIRUBINUR small 10/06/2014 1612   KETONESUR NEGATIVE 06/13/2016 0808   PROTEINUR 100 (A) 06/13/2016 0808   UROBILINOGEN 2.0 10/06/2014 1612   UROBILINOGEN 4.0 (H) 05/30/2010 1511   NITRITE NEGATIVE 06/13/2016 0808   LEUKOCYTESUR MODERATE (A) 06/13/2016 0808    Recent Results (from the past 240 hour(s))  MRSA PCR Screening     Status: Abnormal   Collection Time: 06/11/16  4:18 AM  Result Value Ref Range Status   MRSA by PCR INVALID RESULTS, SPECIMEN SENT FOR CULTURE (A) NEGATIVE Final    Comment: NOTIFIED RN JENNIFER AT [phone] 06/11/16 BY A.DAVIS  MRSA culture     Status: None   Collection Time: 06/11/16  4:18 AM  Result Value Ref Range Status   Specimen Description NASAL SWAB  Final   Special Requests NONE  Final   Culture NO MRSA DETECTED  Final   Report Status 06/14/2016 FINAL  Final  Culture, Urine     Status: Abnormal   Collection Time: 06/13/16  8:08 AM  Result Value Ref Range Status   Specimen Description URINE, RANDOM  Final   Special Requests Normal  Final    Culture >=100,000 COLONIES/mL YEAST (A)  Final   Report Status 06/14/2016 FINAL  Final  Culture, respiratory (NON-Expectorated)     Status: None   Collection Time: 06/13/16  9:04 AM  Result Value Ref Range Status   Specimen Description TRACHEAL ASPIRATE  Final   Special Requests Normal  Final   Gram Stain   Final    MODERATE WBC PRESENT,BOTH PMN AND MONONUCLEAR FEW GRAM POSITIVE COCCI IN PAIRS IN CLUSTERS RARE GRAM NEGATIVE RODS    Culture Consistent with normal respiratory flora.  Final   Report Status 06/15/2016 FINAL  Final  Culture, blood (Routine X 2) w Reflex to ID Panel     Status: None (Preliminary result)   Collection Time: 06/13/16  9:44 AM  Result Value Ref Range Status   Specimen Description BLOOD LEFT ARM  Final   Special Requests IN PEDIATRIC BOTTLE Blood Culture adequate volume  Final   Culture NO GROWTH 2 DAYS  Final   Report Status PENDING  Incomplete  Culture, blood (Routine X 2) w Reflex to ID Panel     Status: None (Preliminary result)   Collection Time: 06/13/16 10:49 AM  Result Value Ref Range Status   Specimen Description BLOOD LEFT HAND  Final   Special Requests IN PEDIATRIC BOTTLE BCAV  Final   Culture NO GROWTH 2 DAYS  Final   Report Status PENDING  Incomplete    Radiology Studies: No results found. Scheduled Meds: . acyclovir  200 mg Oral Once per day on Sun Tue Thu Sat  . acyclovir  200 mg Oral Daily  . acyclovir  600 mg Oral Once per day on Mon Wed Fri  . anticoagulant sodium citrate  10 mL Intravenous Once  . budesonide (PULMICORT) nebulizer solution  0.5 mg Nebulization BID  . ceftAZIDime  2 g Intravenous Q M,W,F-2000  . chlorhexidine gluconate (MEDLINE KIT)  15 mL Mouth Rinse BID  . [START ON 07/21/16] darbepoetin (ARANESP) injection - DIALYSIS  200 mcg Intravenous Q Wed-HD  . feeding supplement (OSMOLITE 1.5 CAL)  1,000 mL Per Tube Q24H  . feeding supplement (PRO-STAT SUGAR FREE 64)  60 mL Per Tube TID  . insulin aspart  0-9 Units  Subcutaneous Q4H  . ipratropium-albuterol  3 mL Nebulization TID  . levothyroxine  125 mcg Per Tube QAC breakfast  . mouth rinse  15 mL Mouth Rinse QID  . midodrine  15 mg Per Tube TID WC  . multivitamin  1 tablet Oral QHS  . pantoprazole sodium  40 mg Per Tube Daily  . vancomycin  1,000 mg Intravenous Q M,W,F-HD   Continuous Infusions: . sodium chloride 10 mL/hr at 06/03/16 2000  . sodium chloride Stopped (06/06/16 0700)     LOS: 39 days   Time spent: 28 min  Irwin Brakeman, MD [facility] Pager [phone]  If 7PM-7AM, please contact night-coverage [email or web address] Password [email or web address] 06/16/2016, 8:14 AM

## 2016-06-16 NOTE — Consult Note (Addendum)
WOC Nurse wound consult note Reason for Consult: Consult requested for chest wound.  Right side of chest with loose peeling skin, revealing full thickness skin loss.  Pt was previously reported to have shingles to this location; wound is atypical in appearance and of unknown etiology.  Pt has a right arm fracture and it is unknown when this occurred according to the EMR; perhaps pt had a fall at some point and chest is a wound which is evolving related to a previous traumatic event.  It is difficult to reduce pressure over the affected area to promote healing since right arm is in a sling directly over the site. Measurement: 6X16X.1cm; 85% red, 15% slough/eschar in the center of the wound.  Loose peeling skin to wound edges. Small amt pink drainage, no odor.  Dressing procedure/placement/frequency: Xeroform gauze to promote drying and healing, foam dressings to protect from further injury. No family present at bedside to discuss plan of care. Please re-consult if further assistance is needed.  Thank-you,  Cammie Mcgee MSN, RN, CWOCN, [place], CNS [phone]

## 2016-06-16 NOTE — Progress Notes (Signed)
Latest temp-102.1 orally, bath given, MD aware with order for tylenol. Given.

## 2016-06-17 ENCOUNTER — Inpatient Hospital Stay (HOSPITAL_COMMUNITY): Payer: Medicare Other

## 2016-06-17 DIAGNOSIS — S42209A Unspecified fracture of upper end of unspecified humerus, initial encounter for closed fracture: Secondary | ICD-10-CM

## 2016-06-17 DIAGNOSIS — M545 Low back pain: Secondary | ICD-10-CM

## 2016-06-17 DIAGNOSIS — R509 Fever, unspecified: Secondary | ICD-10-CM

## 2016-06-17 DIAGNOSIS — R109 Unspecified abdominal pain: Secondary | ICD-10-CM

## 2016-06-17 LAB — RENAL FUNCTION PANEL
Albumin: 1.7 g/dL — ABNORMAL LOW (ref 3.5–5.0)
Albumin: 1.8 g/dL — ABNORMAL LOW (ref 3.5–5.0)
Anion gap: 4 — ABNORMAL LOW (ref 5–15)
Anion gap: 6 (ref 5–15)
BUN: 41 mg/dL — AB (ref 6–20)
BUN: 57 mg/dL — ABNORMAL HIGH (ref 6–20)
CHLORIDE: 98 mmol/L — AB (ref 101–111)
CHLORIDE: 96 mmol/L — AB (ref 101–111)
CO2: 30 mmol/L (ref 22–32)
CO2: 28 mmol/L (ref 22–32)
CREATININE: 3.61 mg/dL — AB (ref 0.61–1.24)
CREATININE: 4.56 mg/dL — AB (ref 0.61–1.24)
Calcium: 7.9 mg/dL — ABNORMAL LOW (ref 8.9–10.3)
Calcium: 8.1 mg/dL — ABNORMAL LOW (ref 8.9–10.3)
GFR calc Af Amer: 18 mL/min — ABNORMAL LOW (ref >60)
GFR calc non Af Amer: 15 mL/min — ABNORMAL LOW (ref >60)
GFR calc non Af Amer: 12 mL/min — ABNORMAL LOW (ref >60)
GFR, EST AFRICAN AMERICAN: 14 mL/min — AB (ref >60)
Glucose, Bld: 115 mg/dL — ABNORMAL HIGH (ref 65–99)
Glucose, Bld: 110 mg/dL — ABNORMAL HIGH (ref 65–99)
Phosphorus: 6.8 mg/dL — ABNORMAL HIGH (ref 2.5–4.6)
Phosphorus: 5.6 mg/dL — ABNORMAL HIGH (ref 2.5–4.6)
Potassium: 3.8 mmol/L (ref 3.5–5.1)
Potassium: 4 mmol/L (ref 3.5–5.1)
SODIUM: 132 mmol/L — AB (ref 135–145)
Sodium: 130 mmol/L — ABNORMAL LOW (ref 135–145)

## 2016-06-17 LAB — CBC
HCT: 24.2 % — ABNORMAL LOW (ref 39.0–52.0)
HEMATOCRIT: 24.2 % — AB (ref 39.0–52.0)
Hemoglobin: 7.3 g/dL — ABNORMAL LOW (ref 13.0–17.0)
Hemoglobin: 7.7 g/dL — ABNORMAL LOW (ref 13.0–17.0)
MCH: 34 pg (ref 26.0–34.0)
MCH: 35.3 pg — AB (ref 26.0–34.0)
MCHC: 30.2 g/dL (ref 30.0–36.0)
MCHC: 31.8 g/dL (ref 30.0–36.0)
MCV: 112.6 fL — AB (ref 78.0–100.0)
MCV: 111 fL — AB (ref 78.0–100.0)
PLATELETS: 200 10*3/uL (ref 150–400)
Platelets: 225 10*3/uL (ref 150–400)
RBC: 2.15 MIL/uL — AB (ref 4.22–5.81)
RBC: 2.18 MIL/uL — AB (ref 4.22–5.81)
RDW: 21.7 % — AB (ref 11.5–15.5)
RDW: 21.3 % — ABNORMAL HIGH (ref 11.5–15.5)
WBC: 4.5 10*3/uL (ref 4.0–10.5)
WBC: 3.9 10*3/uL — ABNORMAL LOW (ref 4.0–10.5)

## 2016-06-17 LAB — GLUCOSE, CAPILLARY
Glucose-Capillary: 98 mg/dL (ref 65–99)
Glucose-Capillary: 110 mg/dL — ABNORMAL HIGH (ref 65–99)
Glucose-Capillary: 110 mg/dL — ABNORMAL HIGH (ref 65–99)
Glucose-Capillary: 96 mg/dL (ref 65–99)
Glucose-Capillary: 106 mg/dL — ABNORMAL HIGH (ref 65–99)

## 2016-06-17 LAB — DIFFERENTIAL
BASOS PCT: 1 %
Basophils Absolute: 0 10*3/uL (ref 0.0–0.1)
EOS ABS: 0.1 10*3/uL (ref 0.0–0.7)
Eosinophils Relative: 2 %
LYMPHS PCT: 63 %
Lymphs Abs: 2.4 10*3/uL (ref 0.7–4.0)
MONOS PCT: 30 %
Monocytes Absolute: 1.2 10*3/uL — ABNORMAL HIGH (ref 0.1–1.0)
Neutro Abs: 0.2 10*3/uL — ABNORMAL LOW (ref 1.7–7.7)
Neutrophils Relative %: 4 %

## 2016-06-17 MED ORDER — ALTEPLASE 2 MG IJ SOLR: 2.0000 mg | Freq: Once | INTRAMUSCULAR | Status: DC | PRN

## 2016-06-17 MED ORDER — IPRATROPIUM-ALBUTEROL 0.5-2.5 (3) MG/3ML IN SOLN
3.0000 mL | Freq: Two times a day (BID) | RESPIRATORY_TRACT | Status: DC
  Administered 2016-06-17 – 2016-06-20 (×6): 3 mL via RESPIRATORY_TRACT
  Filled 2016-06-17 (×7): qty 3

## 2016-06-17 MED ORDER — HEPARIN SODIUM (PORCINE) 1000 UNIT/ML DIALYSIS: 20.0000 [IU]/kg | INTRAMUSCULAR | Status: DC | PRN

## 2016-06-17 MED ORDER — LIDOCAINE HCL (PF) 1 % IJ SOLN: 5.0000 mL | INTRAMUSCULAR | Status: DC | PRN

## 2016-06-17 MED ORDER — SODIUM CHLORIDE 0.9 % IV BOLUS (SEPSIS): 1000.0000 mL | Freq: Once | INTRAVENOUS | Status: DC

## 2016-06-17 MED ORDER — LIDOCAINE-PRILOCAINE 2.5-2.5 % EX CREA: 1.0000 "application " | TOPICAL_CREAM | CUTANEOUS | Status: DC | PRN

## 2016-06-17 MED ORDER — SODIUM CHLORIDE 0.9 % IV SOLN: 100.0000 mL | INTRAVENOUS | Status: DC | PRN

## 2016-06-17 MED ORDER — PENTAFLUOROPROP-TETRAFLUOROETH EX AERO: 1.0000 "application " | INHALATION_SPRAY | CUTANEOUS | Status: DC | PRN

## 2016-06-17 MED ORDER — HEPARIN SODIUM (PORCINE) 1000 UNIT/ML DIALYSIS: 1000.0000 [IU] | INTRAMUSCULAR | Status: DC | PRN

## 2016-06-17 NOTE — Progress Notes (Addendum)
06/17/2016 5:19 PM  I called the [facility] transfer line to inquire about the status of patient's transfer.  They said that they would look into it and call me back with an update.  I am waiting for that.  Update: they never called me back, I will inquire again tomorrow.    Maryln Manuel, MD

## 2016-06-17 NOTE — Progress Notes (Signed)
PROGRESS NOTE  Omar [name]  OEH:[id number] DOB: 1945-07-06 DOA: 06/02/2016 PCP: Redge Gainer, MD  Brief Narrative:   71 y/o male from [place] admitted 3/5 with altered mental status + H.flu PNA with aspiration, sepsis, hypoxic/hypercapnic respiratory failure. Course c/b retroperitoneal bleed while on heparin with subsequent shock, acute renal failure & prolonged ventilation requiring tracheostomy.  On CRRT intermittently through hosp and has not tolerated HD due to hypotension.  He has had a tunneled HD catheter placed.  He continues to have significant hypotension preventing placement of permanent feeding tube.  Continuing to have SLP reevaluate.   Started on dys I diet per SLP 4/10.  Palliative care is following.  Needs surgical G-tube placement once blood pressures have improved enough to tolerate anesthesia.    Antibiotics: Zosyn 3/06 >> 3/08 Rocephin (H.Flu PNA) 3/08 >> 3/14 Vancomycin 3/23 >> 3/28 Cefepime 3/23 >> 4/1 Meropenem 4/1 >> Vancomycin 4/3 >> 4/4  Zovirax 4/11  Cultures: Urine 3/05 >> negative Blood 3/06 >> negative Resp 3/6 >> H flu (b-lactamase +) Trach stoma site 3/23 >> diphtheroids Resp cx 3/28 >>nl flora Blood 4/1 NGTD Urine 4/10: yeast  Lines/tubes: ETT 3/05 >> 3/8 ETT 3/10 >> 3/21 Trach (Dr Redmond Baseman) 3/21 >>  Lt IJ CVL 3/07 >> 3/27 R HD catheter  >> 3/22 R IJ HD catheter 3/23 >>   Events: 3/05  Transfer to [facility] Sputum 3/06 >> Haemophilus influenzae (beta lactamase positive) Echo 3/06 >> EF 65 to 70%, mod LVH, mod TR, PAS 39 mmHg CT head 3/7 >> no acute abnormality  3/10 - Large retroperitoneal bleed noted on CT after c/o back pain/leg pain 3/27 rt humerus fracture identified - ortho consult 3/27 RUQ [place] .Marland Kitchen Not helpful due to body habitus. On ATC with copious secrtions.  3/29 - Chronic  critically ill 3/27 - end CRRT 3/30 - doing ATC x 24-48h per RN. Rt shoulder pain only when touched per RN. Making urine. Off CRRT. Not on pressors. Family not fully on  board about LTAC per RN but looking at [place]. Needs LTAC.  4/2 - resumed CRRT 4/3 - Family agreeable to PEG tube 4/5 - G-tube to be placed by gen surg however anesthesia refused 2/2 hypotension.  4/6 - restarted HD. Tolerated.  4/7 - transferred to SDU 4/8 - tunneled HD catheter by IR 4/9 - recurrent sepsis/fevers 4/10 - shingles outbreak  Assessment & Plan:    Active Problems:   Paroxysmal atrial fibrillation (HCC)   Hemiparesis affecting right side as late effect of cerebrovascular accident [facility])   Hypothyroidism   Acute respiratory failure with hypoxemia (HCC)   Shock circulatory (HCC)   Altered mental status   Retroperitoneal hemorrhage   Goals of care, counseling/discussion   On enteral nutrition   Acute renal failure (ARF) (HCC)   Electrolyte imbalance   Shock liver   Current use of proton pump inhibitor   Contraindication to deep vein thrombosis (DVT) prophylaxis   Hepatic lesion   Palliative care encounter   Acute respiratory failure with hypercapnia (HCC)   AKI (acute kidney injury) ([place])   Stage 5 chronic kidney disease on chronic dialysis ([place])   HCAP (healthcare-associated pneumonia)   Palliative care by specialist   Tracheostomy care ([place])   Pressure injury of skin  Acute on chronic hypoxemic respiratory failure, improved.   Pneumonia, H influenza (beta lactamase positive), treated New HCAP on 4/1, treated Pulm edema, improved Presumed OSA/OHS History of asthma Cont ATC, keep o2 sats > 88% Off vent  since 4/2 duoneb qid + pulmicort BID Completed a course of meropenem on 4/8 for HCAP Follow up with ENT and trach clinic outpatient, continue regular trach care  Septic and hemorrhagic shock, resolved. Off pressors 3/31 but had been intermittently hypotensive, slowly improving Paroxysmal atrial fibrillation History of hypertension Off stress dose steroids, continue to hold anti-hypertensives. Continue midodrine to 78m TID (started on 4/5) Blood cultures  4/10: NGTD Repeat CXR 4/14: pending Tracheal aspirate culture pending Varicella zoster PCR positive - on zovirax per pharmacy dosing Airborne precautions  Vesicular/bullous rash on right chest, consider metal allergy from the sling he has been wearing.  DDx also include shingles.  T4 dermatome and has a large ulcerated area on his back in same dermatomal distribution -  Varicella PCR positive -  Cover metal ring with tape -  Airborne and contact precautions - started acyclovir 4/11  Fever - unknown source - asked for ID consult for assistance.   New ESRD, Acute renal failure, presumed due to ATN in the setting of hemorrhagic shock. Pt intermittently on CRRT and  Currently on HD since 4/6 HD MWF Midodrine increasedwith better blood pressures Tunneled HD catheter placed on 4/9 by IR  Large retroperitoneal bleed noted 3/10, some slight increased 3/18 CT scan. Hb has been stable.   Shock liver improving.  Hepatic lesion; noted on CT scan of the abdomen 3/18. 5 cm lesion of the dome of the liver, could represent blood or infection or infarct.  Protein calorie malnutrition Continue PPI Continue tube feeds for now PEG/G tube unable to be placed.  IR unable to place 2/2 complicated anatomy. Gen surg unable to place 2/2 hypotension. [facility] may be able to take patient and make arrangements for his PEG tube  Acute blood loss anemia superimposed on anemia of chronic kidney disease, improved Severe heparin sensitivity, associated with hemorrhage Thrombocytopenia, acute on chronic Thrombocytopenia improving Follow CBC, Goal hemoglobin greater than 7.0.  No recent bleeding or transfusion.  No heparin for now.  Hold anticoagulation  SCD for DVT prophylaxis  H. influenzae pneumonia and HCAP treatment completed [facility], [facility]23, [facility].  Completed meropenem on 4/8.  Cultures (-) so far.   Hyperglycemia, on steroids,  resolved Hypothyroidism, stable, continue synthroid  Metabolic uremic encephalopathy, improved.  History of CVA with right hemiplegia Rt humerus fracture  Mentation improving PT consult - LTAC recommended.  RUE sling Morphine prn pain  FAMILY DISCUSSIONS:   Family OK with PEG tube as of 4/3 but unfortunately pt could not tolerate anesthesia.  Dr. DCorrie Dandyspoke with granddaughter on 4/8 and updated her of pts condition that he was transferred to SDU.   [facility]. Plan for LTAC     DVT prophylaxis:  SCDs Code Status:  full Family Communication:  Patient alone Disposition Plan:  LTAC  Consultants:   General surgery  Nephrology  ENT  Orthopedic surgery, Dr. JJacqulynn Cadet Palliative care  Interventional radiology  Infectious Disease   Antimicrobials:  Anti-infectives    Start     Dose/Rate Route Frequency Ordered Stop   06/19/16 1200  vancomycin (VANCOCIN) IVPB 1000 mg/200 mL premix     1,000 mg 200 mL/hr over 60 Minutes Intravenous Every M-W-F (Hemodialysis) 06/16/16 1255     06/16/16 2200  acyclovir (ZOVIRAX) tablet 600 mg  Status:  Discontinued     600 mg Oral Once per day on Mon Wed  Fri 06/15/16 1126 06/15/16 1145   06/16/16 2200  acyclovir (ZOVIRAX) 200 MG capsule 600 mg     600 mg Oral Once per day on Mon Wed Fri 06/15/16 1443     06/16/16 2000  ceftAZIDime (FORTAZ) IVPB 2 g  Status:  Discontinued     2 g 100 mL/hr over 30 Minutes Intravenous Every M-W-F (2000) 06/15/16 1438 06/16/16 1652   06/16/16 2000  cefTAZidime (FORTAZ) 2 g in dextrose 5 % 50 mL IVPB     2 g 100 mL/hr over 30 Minutes Intravenous Every M-W-F (2000) 06/16/16 1652     06/16/16 1200  vancomycin (VANCOCIN) IVPB 750 mg/150 ml premix     750 mg 150 mL/hr over 60 Minutes Intravenous Every Fri (Hemodialysis) 06/16/16 1157 06/16/16 1421   06/16/16 1000  acyclovir (ZOVIRAX) 200 MG capsule 600 mg  Status:  Discontinued      600 mg Oral Once per day on Mon Wed Fri 06/15/16 1145 06/15/16 1443   06/16/16 1000  acyclovir (ZOVIRAX) tablet 200 mg  Status:  Discontinued     200 mg Oral Daily 06/15/16 1435 06/16/16 0415   06/16/16 1000  acyclovir (ZOVIRAX) 200 MG capsule 200 mg     200 mg Oral Daily 06/16/16 0416     06/15/16 2200  acyclovir (ZOVIRAX) 200 MG capsule 200 mg  Status:  Discontinued     200 mg Oral 2 times daily 06/15/16 0801 06/15/16 1126   06/15/16 2200  acyclovir (ZOVIRAX) tablet 200 mg  Status:  Discontinued     200 mg Oral Once per day on Sun Tue Thu Sat 06/15/16 1126 06/15/16 1144   06/15/16 2200  acyclovir (ZOVIRAX) 200 MG capsule 200 mg     200 mg Oral Once per day on Sun Tue Thu Sat 06/15/16 1144     06/15/16 1200  acyclovir (ZOVIRAX) 200 MG capsule 200 mg  Status:  Discontinued     200 mg Oral Daily 06/15/16 1141 06/15/16 1435   06/15/16 1130  acyclovir (ZOVIRAX) tablet 400 mg     400 mg Oral  Once 06/15/16 1117 06/15/16 1446   06/15/16 1130  Ceftazidime (FORTAZ) injection 2 g  Status:  Discontinued     2 g Intravenous  Once 06/15/16 1117 06/15/16 1118   06/15/16 1130  cefTAZidime (FORTAZ) 2 g in dextrose 5 % 50 mL IVPB     2 g 100 mL/hr over 30 Minutes Intravenous  Once 06/15/16 1122 06/15/16 1240   06/15/16 1130  acyclovir (ZOVIRAX) tablet 200 mg  Status:  Discontinued     200 mg Oral  Every morning - 10a 06/15/16 1126 06/15/16 1141   06/15/16 0900  cefTAZidime (FORTAZ) 2 g in dextrose 5 % 50 mL IVPB  Status:  Discontinued     2 g 100 mL/hr over 30 Minutes Intravenous  Once 06/15/16 0720 06/15/16 1117   06/15/16 0900  acyclovir (ZOVIRAX) 200 MG capsule 400 mg  Status:  Discontinued     400 mg Oral  Once 06/15/16 0801 06/15/16 1117   06/15/16 0000  acyclovir (ZOVIRAX) 200 MG capsule 200 mg  Status:  Discontinued     200 mg Oral 2 times daily 06/14/16 2353 06/15/16 0801   06/14/16 2200  acyclovir (ZOVIRAX) tablet 200 mg  Status:  Discontinued     200 mg Oral 2 times daily 06/14/16 1533  06/14/16 2353   06/14/16 1800  cefTAZidime (FORTAZ) 2 g in dextrose 5 % 50 mL IVPB  Status:  Discontinued     2 g 100 mL/hr over 30 Minutes Intravenous Every M-W-F (Hemodialysis) 06/13/16 1338 06/15/16 1438   06/14/16 1600  acyclovir (ZOVIRAX) tablet 400 mg  Status:  Discontinued     400 mg Oral  Once 06/14/16 1533 06/15/16 0801   06/14/16 1200  vancomycin (VANCOCIN) IVPB 1000 mg/200 mL premix  Status:  Discontinued     1,000 mg 200 mL/hr over 60 Minutes Intravenous Every M-W-F (Hemodialysis) 06/13/16 1338 06/16/16 1157   06/13/16 1300  vancomycin (VANCOCIN) 2,000 mg in sodium chloride 0.9 % 500 mL IVPB     2,000 mg 250 mL/hr over 120 Minutes Intravenous  Once 06/13/16 1244 06/13/16 1645   06/13/16 1300  cefTAZidime (FORTAZ) 2 g in dextrose 5 % 50 mL IVPB     2 g 100 mL/hr over 30 Minutes Intravenous  Once 06/13/16 1244 06/13/16 1506   06/12/16 1448  ceFAZolin (ANCEF) 2-4 GM/100ML-% IVPB  Status:  Discontinued    Comments:  Shaaron Adler   : cabinet override      06/12/16 1448 06/12/16 1454   06/12/16 1432  ceFAZolin (ANCEF) 2-4 GM/100ML-% IVPB    Comments:  Laughlin, Amy   : cabinet override      06/12/16 1432 06/12/16 1436   06/17/2016 0900  meropenem (MERREM) 500 mg in sodium chloride 0.9 % 50 mL IVPB     500 mg 100 mL/hr over 30 Minutes Intravenous Every 24 hours 06/07/16 0955 06/11/16 1109   06/25/2016 0600  cefoTEtan (CEFOTAN) 2 g in dextrose 5 % 50 mL IVPB  Status:  Discontinued     2 g 100 mL/hr over 30 Minutes Intravenous To [facility] 06/07/16 1354 06/15/2016 1157   06/06/16 1200  vancomycin (VANCOCIN) 1,500 mg in sodium chloride 0.9 % 500 mL IVPB  Status:  Discontinued     1,500 mg 250 mL/hr over 120 Minutes Intravenous Every 24 hours 06/06/16 0818 06/07/16 0954   06/05/16 1000  meropenem (MERREM) 1 g in sodium chloride 0.9 % 100 mL IVPB  Status:  Discontinued     1 g 200 mL/hr over 30 Minutes Intravenous Every 12 hours 06/05/16 0926 06/07/16 0955   06/04/16 1400   vancomycin (VANCOCIN) 2,000 mg in sodium chloride 0.9 % 500 mL IVPB     2,000 mg 250 mL/hr over 120 Minutes Intravenous  Once 06/04/16 1234 06/04/16 1737   06/04/16 1400  meropenem (MERREM) 500 mg in sodium chloride 0.9 % 50 mL IVPB  Status:  Discontinued     500 mg 100 mL/hr over 30 Minutes Intravenous Every 24 hours 06/04/16 1234 06/05/16 0926   05/30/16 2300  ceFEPIme (MAXIPIME) 1 g in dextrose 5 % 50 mL IVPB  Status:  Discontinued     1 g 100 mL/hr over 30 Minutes Intravenous Every 24 hours 05/30/16 1223 06/04/16 1231   05/27/16 1400  ceFEPIme (MAXIPIME) 2 g in dextrose 5 % 50 mL IVPB  Status:  Discontinued     2 g 100 mL/hr over 30 Minutes Intravenous Every 12 hours 05/27/16 1014 05/30/16 1223   05/27/16 1400  vancomycin (VANCOCIN) 1,500 mg in sodium chloride 0.9 % 500 mL IVPB  Status:  Discontinued     1,500 mg 250 mL/hr over 120 Minutes Intravenous Every 24 hours 05/27/16 1014 05/30/16 1224   05/26/16 1300  ceFEPIme (MAXIPIME) 1 g in dextrose 5 % 50 mL IVPB  Status:  Discontinued     1 g 100 mL/hr over 30 Minutes Intravenous Every 24  hours 05/26/16 1225 05/27/16 1014   05/26/16 1230  vancomycin (VANCOCIN) 2,500 mg in sodium chloride 0.9 % 500 mL IVPB     2,500 mg 250 mL/hr over 120 Minutes Intravenous  Once 05/26/16 1225 05/26/16 1530   05/16/16 1400  cefTRIAXone (ROCEPHIN) 2 g in dextrose 5 % 50 mL IVPB     2 g 100 mL/hr over 30 Minutes Intravenous Every 24 hours 05/15/16 1649 05/17/16 1458   05/11/16 1400  cefTRIAXone (ROCEPHIN) 1 g in dextrose 5 % 50 mL IVPB  Status:  Discontinued     1 g 100 mL/hr over 30 Minutes Intravenous Every 24 hours 05/11/16 0833 05/15/16 1649   05/09/16 1400  piperacillin-tazobactam (ZOSYN) IVPB 3.375 g  Status:  Discontinued     3.375 g 12.5 mL/hr over 240 Minutes Intravenous Every 8 hours 05/09/16 1114 05/11/16 0833     Subjective:  Patient able to nod and shake his head. Has been having fever, also has pain right shoulder.   Review of  systems: Pt unable to answer because of current condition   Objective: Vitals:   06/17/16 0436 06/17/16 0500 06/17/16 0600 06/17/16 0700  BP: 111/71 (!) 83/51 (!) 67/58 (!) 102/57  Pulse: (!) 123 (!) 128 (!) 106 99  Resp: (!) 25 14 (!) 26 (!) 23  Temp: (!) 102.2 F (39 C)     TempSrc: Axillary     SpO2: 96% 96% 95% 93%  Height:        Intake/Output Summary (Last 24 hours) at 06/17/16 0759 Last data filed at 06/17/16 0700  Gross per 24 hour  Intake             1120 ml  Output             3405 ml  Net            -2285 ml   Filed Weights    Examination:  General exam:  Morbidly obese male, trach collar in place with rhonchorous breath sounds, No acute distress.  HEENT:  MMM, trach collar in place Respiratory system:  no focal rales or wheezes Cardiovascular system: tachycardic, normal S1/S2. No murmurs, rubs, gallops or clicks.  Warm extremities Gastrointestinal system: Hyperactive bowel sounds, soft, nondistended, nontender. MSK:  Decreased bulk and strength.  Right arm in sling.  2+ pitting bilateral lower extremity edema Neuro:  3 out of 5 strength of the right upper and right lower extremities, 4-5 strength left upper and lower extremities Skin:  Herpetic rash on right chest wall and back. Has a large ulcerated area on his back in same distribution.  No vesicles under right arm seem.  Base of lesions is not particularly erythematous.   GU:  Urine in foley is dark brown.   Data Reviewed: I have personally reviewed following labs and imaging studies  CBC:  Recent Labs Lab 06/12/16 0417 06/14/16 0535 06/15/16 0255 06/16/16 0311 06/17/16 0500  WBC 4.7 4.4 4.5 5.3 4.5  HGB 8.1* 8.0* 7.9* 8.2* 7.3*  HCT 26.1* 25.0* 24.3* 26.0* 24.2*  MCV 110.6* 111.1* 111.0* 111.1* 112.6*  PLT 103* 130* 115* 160 932   Basic Metabolic Panel:  Recent Labs Lab 06/13/16 0415 06/14/16 0535 06/15/16 0255 06/16/16 0311 06/17/16 0500  NA 132* 133* 132* 130* 132*  K 3.7 4.0 4.2 4.8  3.8  CL 96* 98* 100* 98* 98*  CO2 _0 GLUCOSE 112* 105* 106* 102* 115*  BUN 42* 70* 45* 65* 41*  CREATININE  3.16* 4.40* 3.56* 4.83* 3.61*  CALCIUM 7.9* 7.9* 7.7* 7.9* 7.9*  PHOS 5.1* 6.2* 5.3* 5.9* 6.8*   GFR: Estimated Creatinine Clearance: 26.2 mL/min (A) (by C-G formula based on SCr of 3.61 mg/dL (H)). Liver Function Tests:  Recent Labs Lab 06/13/16 0415 06/14/16 0535 06/15/16 0255 06/16/16 0311 06/17/16 0500  ALBUMIN 1.7* 1.7* 1.7* 1.8* 1.7*   No results for input(s): LIPASE, AMYLASE in the last 168 hours. No results for input(s): AMMONIA in the last 168 hours. Coagulation Profile:  Recent Labs Lab 06/12/16 0417  INR 1.27   Cardiac Enzymes: No results for input(s): CKTOTAL, CKMB, CKMBINDEX, TROPONINI in the last 168 hours. BNP (last 3 results) No results for input(s): PROBNP in the last 8760 hours. HbA1C: No results for input(s): HGBA1C in the last 72 hours. CBG:  Recent Labs Lab 06/16/16 1312 06/16/16 1642 06/16/16 2121 06/16/16 2352 06/17/16 0435  GLUCAP 104* 84 104* 93 98   Lipid Profile: No results for input(s): CHOL, HDL, LDLCALC, TRIG, CHOLHDL, LDLDIRECT in the last 72 hours. Thyroid Function Tests: No results for input(s): TSH, T4TOTAL, FREET4, T3FREE, THYROIDAB in the last 72 hours. Anemia Panel: No results for input(s): VITAMINB12, FOLATE, FERRITIN, TIBC, IRON, RETICCTPCT in the last 72 hours. Urine analysis:    Component Value Date/Time   COLORURINE AMBER (A) 06/13/2016 0808   APPEARANCEUR CLOUDY (A) 06/13/2016 0808   LABSPEC 1.023 06/13/2016 0808   PHURINE 6.0 06/13/2016 0808   GLUCOSEU NEGATIVE 06/13/2016 0808   HGBUR LARGE (A) 06/13/2016 0808   BILIRUBINUR NEGATIVE 06/13/2016 0808   BILIRUBINUR small 10/06/2014 1612   KETONESUR NEGATIVE 06/13/2016 0808   PROTEINUR 100 (A) 06/13/2016 0808   UROBILINOGEN 2.0 10/06/2014 1612   UROBILINOGEN 4.0 (H) 05/30/2010 1511   NITRITE NEGATIVE 06/13/2016 0808   LEUKOCYTESUR MODERATE  (A) 06/13/2016 0808    Recent Results (from the past 240 hour(s))  MRSA PCR Screening     Status: Abnormal   Collection Time: 06/11/16  4:18 AM  Result Value Ref Range Status   MRSA by PCR INVALID RESULTS, SPECIMEN SENT FOR CULTURE (A) NEGATIVE Final    Comment: NOTIFIED RN JENNIFER AT [phone] 06/11/16 BY A.DAVIS  MRSA culture     Status: None   Collection Time: 06/11/16  4:18 AM  Result Value Ref Range Status   Specimen Description NASAL SWAB  Final   Special Requests NONE  Final   Culture NO MRSA DETECTED  Final   Report Status 06/14/2016 FINAL  Final  Culture, Urine     Status: Abnormal   Collection Time: 06/13/16  8:08 AM  Result Value Ref Range Status   Specimen Description URINE, RANDOM  Final   Special Requests Normal  Final   Culture >=100,000 COLONIES/mL YEAST (A)  Final   Report Status 06/14/2016 FINAL  Final  Culture, respiratory (NON-Expectorated)     Status: None   Collection Time: 06/13/16  9:04 AM  Result Value Ref Range Status   Specimen Description TRACHEAL ASPIRATE  Final   Special Requests Normal  Final   Gram Stain   Final    MODERATE WBC PRESENT,BOTH PMN AND MONONUCLEAR FEW GRAM POSITIVE COCCI IN PAIRS IN CLUSTERS RARE GRAM NEGATIVE RODS    Culture Consistent with normal respiratory flora.  Final   Report Status 06/15/2016 FINAL  Final  Culture, blood (Routine X 2) w Reflex to ID Panel     Status: None (Preliminary result)   Collection Time: 06/13/16  9:44 AM  Result Value Ref Range Status  Specimen Description BLOOD LEFT ARM  Final   Special Requests IN PEDIATRIC BOTTLE Blood Culture adequate volume  Final   Culture NO GROWTH 3 DAYS  Final   Report Status PENDING  Incomplete  Culture, blood (Routine X 2) w Reflex to ID Panel     Status: None (Preliminary result)   Collection Time: 06/13/16 10:49 AM  Result Value Ref Range Status   Specimen Description BLOOD LEFT HAND  Final   Special Requests IN PEDIATRIC BOTTLE BCAV  Final   Culture NO GROWTH 3 DAYS   Final   Report Status PENDING  Incomplete    Radiology Studies: Dg Shoulder Right Port  Result Date: 06/16/2016 CLINICAL DATA:  71 year old male with a history of prior right humeral fracture EXAM: PORTABLE RIGHT SHOULDER COMPARISON:  Plain film 05/30/2016 FINDINGS: Oblique fracture of the proximal right humerus again demonstrated without significant callus formation. Unchanged alignment of the fracture fragments. Degenerative changes at the glenohumeral joint and acromioclavicular joint. Right IJ central catheter in place without pneumothorax. Tracheostomy partially imaged.  Enteric tube partially imaged. IMPRESSION: Similar appearance of oblique fracture proximal right humerus, without significant callus formation in the interval and no change in the fracture alignment. Degenerative changes of the glenohumeral joint and acromioclavicular joint. Surgical tubes and lines partially image, as above. Electronically Signed   By: Corrie Mckusick D.O.   On: 06/16/2016 13:16   Scheduled Meds: . acyclovir  200 mg Oral Once per day on Sun Tue Thu Sat  . acyclovir  200 mg Oral Daily  . acyclovir  600 mg Oral Once per day on Mon Wed Fri  . anticoagulant sodium citrate  10 mL Intravenous Once  . budesonide (PULMICORT) nebulizer solution  0.5 mg Nebulization BID  . cefTAZidime (FORTAZ)  IV  2 g Intravenous Q M,W,F-2000  . chlorhexidine gluconate (MEDLINE KIT)  15 mL Mouth Rinse BID  . [START ON 06/29/16] darbepoetin (ARANESP) injection - DIALYSIS  200 mcg Intravenous Q Wed-HD  . feeding supplement (OSMOLITE 1.5 CAL)  1,000 mL Per Tube Q24H  . feeding supplement (PRO-STAT SUGAR FREE 64)  60 mL Per Tube TID  . insulin aspart  0-9 Units Subcutaneous Q4H  . ipratropium-albuterol  3 mL Nebulization TID  . levothyroxine  125 mcg Per Tube QAC breakfast  . mouth rinse  15 mL Mouth Rinse QID  . midodrine  15 mg Per Tube TID WC  . multivitamin  1 tablet Oral QHS  . pantoprazole sodium  40 mg Per Tube Daily  .  [START ON 06/19/2016] vancomycin  1,000 mg Intravenous Q M,W,F-HD   Continuous Infusions: . sodium chloride 10 mL/hr at 06/03/16 2000  . sodium chloride Stopped (06/06/16 0700)     LOS: 40 days   Time spent: 28 min  Irwin Brakeman, MD [facility] Pager [phone]  If 7PM-7AM, please contact night-coverage [email or web address] Password [facility] 06/17/2016, 7:59 AM

## 2016-06-17 NOTE — Progress Notes (Signed)
Pt's Granddaughter (POA) came at 2pm. RN updated her on dialysis (4/13). She wished to remind team that family wanted pt moved to [facility]. RN received irate call from Pt's daughter at [facility] stating granddaughter was very upset at condition of pt and wanted him moved.

## 2016-06-17 NOTE — Consult Note (Signed)
Date of Admission:  05/20/2016  Date of Consult:  06/17/2016  Reason for Consult: FUO and low blood pressure Referring Physician: Dr. Tery Sanfilippo   HPI: Omar [name] is an 71 y.o. male with admission to [facility] on the 5th of March with AMS and H. Flu pneumonia, septic shock. His course was complicated by retroperitoneal bleed while on heparin with subsequent shock, acute renal failure &prolonged ventilation requiring tracheostomy.  On CRRT intermittently through hosp and has not tolerated HD well  due to hypotension. Though yesterday he had 3L removed by conventional HD. He has been on broad spectrum antibiotics. He has suffered right arm fracture and has zoster eruption on chest. He is being seen by wound care. He is on ceftazidime, vancomycin and oral acyclovir. He has had fevers to over 102.2 in last 24 hours and we were consulted re source of FUO.    Past Medical History:  Diagnosis Date  . Asthma   . Atrial fibrillation ([place])   . Gout   . Hypertension   . Morbid obesity ([place])   . Multinodular goiter   . Osteoarthrosis and allied disorders   . Other and unspecified hyperlipidemia   . Stroke [facility])    right sided hemiparesis  . Thrombocytopenia ([place])   . Vitamin D deficiency     Past Surgical History:  Procedure Laterality Date  . IR FLUORO GUIDE CV LINE LEFT  06/12/2016  . IR US GUIDE VASC ACCESS LEFT  06/12/2016  . THYROID SURGERY    . TRACHEOSTOMY TUBE PLACEMENT N/A 05/23/2016   Procedure: TRACHEOSTOMY;  Surgeon: Melida Quitter, MD;  Location: [facility];  Service: ENT;  Laterality: N/A;    Social History:  reports that he has quit smoking. He has never used smokeless tobacco. He reports that he does not drink alcohol or use drugs.   Family History  Problem Relation Age of Onset  . Coronary artery disease Brother     Allergies  Allergen Reactions  . Anacin Af [Acetaminophen]   . Heparin     Retroperitoneal bleed in 2018, previous bleed from heparin in 2013  .  Niaspan [Niacin Er]      Medications: I have reviewed patients current medications as documented in Epic Anti-infectives    Start     Dose/Rate Route Frequency Ordered Stop   06/19/16 1200  vancomycin (VANCOCIN) IVPB 1000 mg/200 mL premix     1,000 mg 200 mL/hr over 60 Minutes Intravenous Every M-W-F (Hemodialysis) 06/16/16 1255     06/16/16 2200  acyclovir (ZOVIRAX) tablet 600 mg  Status:  Discontinued     600 mg Oral Once per day on Mon Wed Fri 06/15/16 1126 06/15/16 1145   06/16/16 2200  acyclovir (ZOVIRAX) 200 MG capsule 600 mg  Status:  Discontinued     600 mg Oral Once per day on Mon Wed Fri 06/15/16 1443 06/17/16 1326   06/16/16 2000  ceftAZIDime (FORTAZ) IVPB 2 g  Status:  Discontinued     2 g 100 mL/hr over 30 Minutes Intravenous Every M-W-F (2000) 06/15/16 1438 06/16/16 1652   06/16/16 2000  cefTAZidime (FORTAZ) 2 g in dextrose 5 % 50 mL IVPB     2 g 100 mL/hr over 30 Minutes Intravenous Every M-W-F (2000) 06/16/16 1652     06/16/16 1200  vancomycin (VANCOCIN) IVPB 750 mg/150 ml premix     750 mg 150 mL/hr over 60 Minutes Intravenous Every Fri (Hemodialysis) 06/16/16 1157 06/16/16 1421  06/16/16 1000  acyclovir (ZOVIRAX) 200 MG capsule 600 mg  Status:  Discontinued     600 mg Oral Once per day on Mon Wed Fri 06/15/16 1145 06/15/16 1443   06/16/16 1000  acyclovir (ZOVIRAX) tablet 200 mg  Status:  Discontinued     200 mg Oral Daily 06/15/16 1435 06/16/16 0415   06/16/16 1000  acyclovir (ZOVIRAX) 200 MG capsule 200 mg  Status:  Discontinued     200 mg Oral Daily 06/16/16 0416 06/17/16 1326   06/15/16 2200  acyclovir (ZOVIRAX) 200 MG capsule 200 mg  Status:  Discontinued     200 mg Oral 2 times daily 06/15/16 0801 06/15/16 1126   06/15/16 2200  acyclovir (ZOVIRAX) tablet 200 mg  Status:  Discontinued     200 mg Oral Once per day on Sun Tue Thu Sat 06/15/16 1126 06/15/16 1144   06/15/16 2200  acyclovir (ZOVIRAX) 200 MG capsule 200 mg  Status:  Discontinued     200 mg Oral Once  per day on Sun Tue Thu Sat 06/15/16 1144 06/17/16 1326   06/15/16 1200  acyclovir (ZOVIRAX) 200 MG capsule 200 mg  Status:  Discontinued     200 mg Oral Daily 06/15/16 1141 06/15/16 1435   06/15/16 1130  acyclovir (ZOVIRAX) tablet 400 mg     400 mg Oral  Once 06/15/16 1117 06/15/16 1446   06/15/16 1130  Ceftazidime (FORTAZ) injection 2 g  Status:  Discontinued     2 g Intravenous  Once 06/15/16 1117 06/15/16 1118   06/15/16 1130  cefTAZidime (FORTAZ) 2 g in dextrose 5 % 50 mL IVPB     2 g 100 mL/hr over 30 Minutes Intravenous  Once 06/15/16 1122 06/15/16 1240   06/15/16 1130  acyclovir (ZOVIRAX) tablet 200 mg  Status:  Discontinued     200 mg Oral  Every morning - 10a 06/15/16 1126 06/15/16 1141   06/15/16 0900  cefTAZidime (FORTAZ) 2 g in dextrose 5 % 50 mL IVPB  Status:  Discontinued     2 g 100 mL/hr over 30 Minutes Intravenous  Once 06/15/16 0720 06/15/16 1117   06/15/16 0900  acyclovir (ZOVIRAX) 200 MG capsule 400 mg  Status:  Discontinued     400 mg Oral  Once 06/15/16 0801 06/15/16 1117   06/15/16 0000  acyclovir (ZOVIRAX) 200 MG capsule 200 mg  Status:  Discontinued     200 mg Oral 2 times daily 06/14/16 2353 06/15/16 0801   06/14/16 2200  acyclovir (ZOVIRAX) tablet 200 mg  Status:  Discontinued     200 mg Oral 2 times daily 06/14/16 1533 06/14/16 2353   06/14/16 1800  cefTAZidime (FORTAZ) 2 g in dextrose 5 % 50 mL IVPB  Status:  Discontinued     2 g 100 mL/hr over 30 Minutes Intravenous Every M-W-F (Hemodialysis) 06/13/16 1338 06/15/16 1438   06/14/16 1600  acyclovir (ZOVIRAX) tablet 400 mg  Status:  Discontinued     400 mg Oral  Once 06/14/16 1533 06/15/16 0801   06/14/16 1200  vancomycin (VANCOCIN) IVPB 1000 mg/200 mL premix  Status:  Discontinued     1,000 mg 200 mL/hr over 60 Minutes Intravenous Every M-W-F (Hemodialysis) 06/13/16 1338 06/16/16 1157   06/13/16 1300  vancomycin (VANCOCIN) 2,000 mg in sodium chloride 0.9 % 500 mL IVPB     2,000 mg 250 mL/hr over 120 Minutes  Intravenous  Once 06/13/16 1244 06/13/16 1645   06/13/16 1300  cefTAZidime (FORTAZ) 2 g in dextrose  5 % 50 mL IVPB     2 g 100 mL/hr over 30 Minutes Intravenous  Once 06/13/16 1244 06/13/16 1506   06/12/16 1448  ceFAZolin (ANCEF) 2-4 GM/100ML-% IVPB  Status:  Discontinued    Comments:  Shaaron Adler   : cabinet override      06/12/16 1448 06/12/16 1454   06/12/16 1432  ceFAZolin (ANCEF) 2-4 GM/100ML-% IVPB    Comments:  Laughlin, Amy   : cabinet override      06/12/16 1432 06/12/16 1436   06/17/2016 0900  meropenem (MERREM) 500 mg in sodium chloride 0.9 % 50 mL IVPB     500 mg 100 mL/hr over 30 Minutes Intravenous Every 24 hours 06/07/16 0955 06/11/16 1109   06/12/2016 0600  cefoTEtan (CEFOTAN) 2 g in dextrose 5 % 50 mL IVPB  Status:  Discontinued     2 g 100 mL/hr over 30 Minutes Intravenous To ShortStay Surgical 06/07/16 1354 06/22/2016 1157   06/06/16 1200  vancomycin (VANCOCIN) 1,500 mg in sodium chloride 0.9 % 500 mL IVPB  Status:  Discontinued     1,500 mg 250 mL/hr over 120 Minutes Intravenous Every 24 hours 06/06/16 0818 06/07/16 0954   06/05/16 1000  meropenem (MERREM) 1 g in sodium chloride 0.9 % 100 mL IVPB  Status:  Discontinued     1 g 200 mL/hr over 30 Minutes Intravenous Every 12 hours 06/05/16 0926 06/07/16 0955   06/04/16 1400  vancomycin (VANCOCIN) 2,000 mg in sodium chloride 0.9 % 500 mL IVPB     2,000 mg 250 mL/hr over 120 Minutes Intravenous  Once 06/04/16 1234 06/04/16 1737   06/04/16 1400  meropenem (MERREM) 500 mg in sodium chloride 0.9 % 50 mL IVPB  Status:  Discontinued     500 mg 100 mL/hr over 30 Minutes Intravenous Every 24 hours 06/04/16 1234 06/05/16 0926   05/30/16 2300  ceFEPIme (MAXIPIME) 1 g in dextrose 5 % 50 mL IVPB  Status:  Discontinued     1 g 100 mL/hr over 30 Minutes Intravenous Every 24 hours 05/30/16 1223 06/04/16 1231   05/27/16 1400  ceFEPIme (MAXIPIME) 2 g in dextrose 5 % 50 mL IVPB  Status:  Discontinued     2 g 100 mL/hr over 30 Minutes  Intravenous Every 12 hours 05/27/16 1014 05/30/16 1223   05/27/16 1400  vancomycin (VANCOCIN) 1,500 mg in sodium chloride 0.9 % 500 mL IVPB  Status:  Discontinued     1,500 mg 250 mL/hr over 120 Minutes Intravenous Every 24 hours 05/27/16 1014 05/30/16 1224   05/26/16 1300  ceFEPIme (MAXIPIME) 1 g in dextrose 5 % 50 mL IVPB  Status:  Discontinued     1 g 100 mL/hr over 30 Minutes Intravenous Every 24 hours 05/26/16 1225 05/27/16 1014   05/26/16 1230  vancomycin (VANCOCIN) 2,500 mg in sodium chloride 0.9 % 500 mL IVPB     2,500 mg 250 mL/hr over 120 Minutes Intravenous  Once 05/26/16 1225 05/26/16 1530   05/16/16 1400  cefTRIAXone (ROCEPHIN) 2 g in dextrose 5 % 50 mL IVPB     2 g 100 mL/hr over 30 Minutes Intravenous Every 24 hours 05/15/16 1649 05/17/16 1458   05/11/16 1400  cefTRIAXone (ROCEPHIN) 1 g in dextrose 5 % 50 mL IVPB  Status:  Discontinued     1 g 100 mL/hr over 30 Minutes Intravenous Every 24 hours 05/11/16 0833 05/15/16 1649   05/09/16 1400  piperacillin-tazobactam (ZOSYN) IVPB 3.375 g  Status:  Discontinued  3.375 g 12.5 mL/hr over 240 Minutes Intravenous Every 8 hours 05/09/16 1114 05/11/16 0833         ROS:  as in HPI otherwise remainder of 12 point Review of Systems is not obtainable due to patient's confusion   Blood pressure (!) 98/35, pulse (!) 119, temperature (!) 101 F (38.3 C), temperature source Oral, resp. rate 20, height '5\' 6"'  (1.676 m), weight (!) 340 lb (154.2 kg), SpO2 99 %. General:awake and at times moves his tracheostomy with his left hand which is restrained, right in sling HEENT: anicteric sclera,  EOMI, tracheostomy Cardiovascular: tachy rate, normal r,  no murmur rubs or gallops Pulmonary:  rhonchi Gastrointestinal: soft  Slightly distended and diffusely tender   Musculoskeletal: no  clubbing or edema noted bilaterally Skin, zoster rash with bandage over most of it  06/17/16:      Neuro: hemiplegia   Results for orders placed or  performed during the hospital encounter of 06/01/2016 (from the past 48 hour(s))  Glucose, capillary     Status: Abnormal   Collection Time: 06/15/16  8:09 PM  Result Value Ref Range   Glucose-Capillary 100 (H) 65 - 99 mg/dL  Glucose, capillary     Status: None   Collection Time: 06/16/16 12:47 AM  Result Value Ref Range   Glucose-Capillary 87 65 - 99 mg/dL  Renal function panel     Status: Abnormal   Collection Time: 06/16/16  3:11 AM  Result Value Ref Range   Sodium 130 (L) 135 - 145 mmol/L   Potassium 4.8 3.5 - 5.1 mmol/L   Chloride 98 (L) 101 - 111 mmol/L   CO2 25 22 - 32 mmol/L   Glucose, Bld 102 (H) 65 - 99 mg/dL   BUN 65 (H) 6 - 20 mg/dL   Creatinine, Ser 4.83 (H) 0.61 - 1.24 mg/dL   Calcium 7.9 (L) 8.9 - 10.3 mg/dL   Phosphorus 5.9 (H) 2.5 - 4.6 mg/dL   Albumin 1.8 (L) 3.5 - 5.0 g/dL   GFR calc non Af Amer 11 (L) >60 mL/min   GFR calc Af Amer 13 (L) >60 mL/min    Comment: (NOTE) The eGFR has been calculated using the CKD EPI equation. This calculation has not been validated in all clinical situations. eGFR's persistently <60 mL/min signify possible Chronic Kidney Disease.    Anion gap 7 5 - 15  CBC     Status: Abnormal   Collection Time: 06/16/16  3:11 AM  Result Value Ref Range   WBC 5.3 4.0 - 10.5 K/uL   RBC 2.34 (L) 4.22 - 5.81 MIL/uL   Hemoglobin 8.2 (L) 13.0 - 17.0 g/dL   HCT 26.0 (L) 39.0 - 52.0 %   MCV 111.1 (H) 78.0 - 100.0 fL   MCH 35.0 (H) 26.0 - 34.0 pg   MCHC 31.5 30.0 - 36.0 g/dL   RDW 22.9 (H) 11.5 - 15.5 %   Platelets 160 150 - 400 K/uL  Glucose, capillary     Status: Abnormal   Collection Time: 06/16/16  5:05 AM  Result Value Ref Range   Glucose-Capillary 113 (H) 65 - 99 mg/dL  Glucose, capillary     Status: Abnormal   Collection Time: 06/16/16  8:17 AM  Result Value Ref Range   Glucose-Capillary 104 (H) 65 - 99 mg/dL  Vancomycin, random     Status: None   Collection Time: 06/16/16  8:30 AM  Result Value Ref Range   Vancomycin Rm 28  Comment:        Random Vancomycin therapeutic range is dependent on dosage and time of specimen collection. A peak range is 20.0-40.0 ug/mL A trough range is 5.0-15.0 ug/mL          Glucose, capillary     Status: Abnormal   Collection Time: 06/16/16  1:12 PM  Result Value Ref Range   Glucose-Capillary 104 (H) 65 - 99 mg/dL  Glucose, capillary     Status: None   Collection Time: 06/16/16  4:42 PM  Result Value Ref Range   Glucose-Capillary 84 65 - 99 mg/dL  Glucose, capillary     Status: Abnormal   Collection Time: 06/16/16  9:21 PM  Result Value Ref Range   Glucose-Capillary 104 (H) 65 - 99 mg/dL  Glucose, capillary     Status: None   Collection Time: 06/16/16 11:52 PM  Result Value Ref Range   Glucose-Capillary 93 65 - 99 mg/dL  Glucose, capillary     Status: None   Collection Time: 06/17/16  4:35 AM  Result Value Ref Range   Glucose-Capillary 98 65 - 99 mg/dL   Comment 1 Notify RN   Renal function panel     Status: Abnormal   Collection Time: 06/17/16  5:00 AM  Result Value Ref Range   Sodium 132 (L) 135 - 145 mmol/L   Potassium 3.8 3.5 - 5.1 mmol/L   Chloride 98 (L) 101 - 111 mmol/L   CO2 30 22 - 32 mmol/L   Glucose, Bld 115 (H) 65 - 99 mg/dL   BUN 41 (H) 6 - 20 mg/dL   Creatinine, Ser 3.61 (H) 0.61 - 1.24 mg/dL   Calcium 7.9 (L) 8.9 - 10.3 mg/dL   Phosphorus 6.8 (H) 2.5 - 4.6 mg/dL   Albumin 1.7 (L) 3.5 - 5.0 g/dL   GFR calc non Af Amer 15 (L) >60 mL/min   GFR calc Af Amer 18 (L) >60 mL/min    Comment: (NOTE) The eGFR has been calculated using the CKD EPI equation. This calculation has not been validated in all clinical situations. eGFR's persistently <60 mL/min signify possible Chronic Kidney Disease.    Anion gap 4 (L) 5 - 15  CBC     Status: Abnormal   Collection Time: 06/17/16  5:00 AM  Result Value Ref Range   WBC 4.5 4.0 - 10.5 K/uL   RBC 2.15 (L) 4.22 - 5.81 MIL/uL   Hemoglobin 7.3 (L) 13.0 - 17.0 g/dL   HCT 24.2 (L) 39.0 - 52.0 %   MCV 112.6 (H)  78.0 - 100.0 fL   MCH 34.0 26.0 - 34.0 pg   MCHC 30.2 30.0 - 36.0 g/dL   RDW 21.7 (H) 11.5 - 15.5 %   Platelets 200 150 - 400 K/uL  Glucose, capillary     Status: Abnormal   Collection Time: 06/17/16  8:45 AM  Result Value Ref Range   Glucose-Capillary 110 (H) 65 - 99 mg/dL  Glucose, capillary     Status: Abnormal   Collection Time: 06/17/16 10:51 AM  Result Value Ref Range   Glucose-Capillary 110 (H) 65 - 99 mg/dL   '@BRIEFLABTABLE' (sdes,specrequest,cult,reptstatus)   ) Recent Results (from the past 720 hour(s))  Culture, respiratory (NON-Expectorated)     Status: None   Collection Time: 05/26/16 12:03 PM  Result Value Ref Range Status   Specimen Description TRACHEAL ASPIRATE  Final   Special Requests NONE  Final   Gram Stain   Final    MODERATE RARE  GRAM NEGATIVE RODS RARE GRAM POSITIVE COCCI IN PAIRS RARE GRAM POSITIVE RODS    Culture   Final    FEW DIPHTHEROIDS(CORYNEBACTERIUM SPECIES) Standardized susceptibility testing for this organism is not available.    Report Status 05/28/2016 FINAL  Final  Culture, respiratory (NON-Expectorated)     Status: None   Collection Time: 05/31/16 11:48 AM  Result Value Ref Range Status   Specimen Description TRACHEAL ASPIRATE  Final   Special Requests NONE  Final   Gram Stain   Final    ABUNDANT WBC PRESENT, PREDOMINANTLY PMN FEW SQUAMOUS EPITHELIAL CELLS PRESENT MODERATE GRAM NEGATIVE RODS FEW GRAM POSITIVE COCCI IN PAIRS RARE GRAM POSITIVE RODS RARE YEAST    Culture Consistent with normal respiratory flora.  Final   Report Status 06/02/2016 FINAL  Final  Culture, blood (Routine X 2) w Reflex to ID Panel     Status: None   Collection Time: 06/04/16 12:45 PM  Result Value Ref Range Status   Specimen Description BLOOD LEFT ARM  Final   Special Requests IN PEDIATRIC BOTTLE Blood Culture adequate volume  Final   Culture NO GROWTH 5 DAYS  Final   Report Status 06/09/2016 FINAL  Final  Culture, blood (Routine X 2) w Reflex to ID  Panel     Status: None   Collection Time: 06/04/16 12:45 PM  Result Value Ref Range Status   Specimen Description BLOOD LEFT HAND  Final   Special Requests   Final    BOTTLES DRAWN AEROBIC ONLY Blood Culture adequate volume   Culture NO GROWTH 5 DAYS  Final   Report Status 06/09/2016 FINAL  Final  Culture, respiratory (NON-Expectorated)     Status: None   Collection Time: 06/04/16  4:22 PM  Result Value Ref Range Status   Specimen Description TRACHEAL ASPIRATE  Final   Special Requests NONE  Final   Gram Stain   Final    ABUNDANT WBC PRESENT, PREDOMINANTLY PMN FEW GRAM NEGATIVE RODS FEW GRAM POSITIVE COCCI IN CLUSTERS FEW GRAM POSITIVE COCCI IN PAIRS    Culture Consistent with normal respiratory flora.  Final   Report Status 06/07/2016 FINAL  Final  Culture, Urine     Status: None   Collection Time: 06/04/16  8:57 PM  Result Value Ref Range Status   Specimen Description URINE, RANDOM  Final   Special Requests NONE  Final   Culture NO GROWTH  Final   Report Status 06/06/2016 FINAL  Final  MRSA PCR Screening     Status: Abnormal   Collection Time: 06/11/16  4:18 AM  Result Value Ref Range Status   MRSA by PCR INVALID RESULTS, SPECIMEN SENT FOR CULTURE (A) NEGATIVE Final    Comment: NOTIFIED RN JENNIFER AT [id number] 06/11/16 BY A.[email or web address]  MRSA culture     Status: None   Collection Time: 06/11/16  4:18 AM  Result Value Ref Range Status   Specimen Description NASAL SWAB  Final   Special Requests NONE  Final   Culture NO MRSA DETECTED  Final   Report Status 06/14/2016 FINAL  Final  Culture, Urine     Status: Abnormal   Collection Time: 06/13/16  8:08 AM  Result Value Ref Range Status   Specimen Description URINE, RANDOM  Final   Special Requests Normal  Final   Culture >=100,000 COLONIES/mL YEAST (A)  Final   Report Status 06/14/2016 FINAL  Final  Culture, respiratory (NON-Expectorated)     Status: None   Collection Time: 06/13/16  9:04 AM  Result Value Ref Range Status   Specimen  Description TRACHEAL ASPIRATE  Final   Special Requests Normal  Final   Gram Stain   Final    MODERATE WBC PRESENT,BOTH PMN AND MONONUCLEAR FEW GRAM POSITIVE COCCI IN PAIRS IN CLUSTERS RARE GRAM NEGATIVE RODS    Culture Consistent with normal respiratory flora.  Final   Report Status 06/15/2016 FINAL  Final  Culture, blood (Routine X 2) w Reflex to ID Panel     Status: None (Preliminary result)   Collection Time: 06/13/16  9:44 AM  Result Value Ref Range Status   Specimen Description BLOOD LEFT ARM  Final   Special Requests IN PEDIATRIC BOTTLE Blood Culture adequate volume  Final   Culture NO GROWTH 4 DAYS  Final   Report Status PENDING  Incomplete  Culture, blood (Routine X 2) w Reflex to ID Panel     Status: None (Preliminary result)   Collection Time: 06/13/16 10:49 AM  Result Value Ref Range Status   Specimen Description BLOOD LEFT HAND  Final   Special Requests IN PEDIATRIC BOTTLE BCAV  Final   Culture NO GROWTH 4 DAYS  Final   Report Status PENDING  Incomplete     Impression/Recommendation  Active Problems:   Paroxysmal atrial fibrillation (HCC)   Hemiparesis affecting right side as late effect of cerebrovascular accident ([place])   Hypothyroidism   Acute respiratory failure with hypoxemia (HCC)   Shock circulatory (HCC)   Altered mental status   Retroperitoneal hemorrhage   Goals of care, counseling/discussion   On enteral nutrition   Acute renal failure (ARF) (HCC)   Electrolyte imbalance   Shock liver   Current use of proton pump inhibitor   Contraindication to deep vein thrombosis (DVT) prophylaxis   Hepatic lesion   Palliative care encounter   Acute respiratory failure with hypercapnia (HCC)   AKI (acute kidney injury) ([place])   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 male with ith [facility]th of March with AMS and H. Flu pneumonia, septic shock. His course was complicated by retroperitoneal bleed while on heparin with subsequent shock, acute renal failure &prolonged ventilation requiring tracheostomy.  On CRRT with persistent fevers now with zoster rash that is improving  #1 FUO:   --ordered CT chest abdomen and pelvis for now --changed acyclovir to IV in case any systemic involvement (less likely) --will check some other FUO labs ut await imaging.   Drug fever possible . Close PE may disclose another site for example infection of fracture of decubitus ulcer  Watch lines for evidence of infection  followup culture data  #2 Low BP: if he had 3L removed yesterday without previously tolerating HD this may be reason for low bp  06/17/2016, 7:22 PM   Thank you so much for this interesting consult  [place] for [place] [phone] (pager) [phone] (office) 06/17/2016, 7:22 PM  [place] 06/17/2016, 7:22 PM

## 2016-06-17 NOTE — Progress Notes (Signed)
Assessment/Plan: 1 AKI oliguric. S/P [facility]DC 2 Resp failure with trach  3 Massive obesity 4 CVA 5 Arm fx 6 RP bleed 8 Anemia esa, /Fe 9 low ptlt 10 afib, low BP 11 Nutrition needs PEG 12 HZV  P HD Mon-Wed- FRI  Subjective: Interval History: HD yesterday with 3 liters off and post HD low BP  Objective: Vital signs in last 24 hours: Temp:  [99.3 F (37.4 C)-102.2 F (39 C)] 99.3 F (37.4 C) (04/14 0844) Pulse Rate:  [87-128] 122 (04/14 1549) Resp:  [14-31] 22 (04/14 1549) BP: (67-119)/(41-92) 115/68 (04/14 1549) SpO2:  [84 %-100 %] 85 % (04/14 1549) FiO2 (%):  [28 %] 28 % (04/14 1549) Weight change:   Intake/Output from previous day: 04/13 0701 - 04/14 0700 In: 1120 [I.V.:90; NG/GT:980; IV Piggyback:50] Out: 3405 [Stool:350] Intake/Output this shift: Total I/O In: 200 [NG/GT:200] Out: 800 [Stool:800]  General appearance: obese, resting , trach collar  Lab Results:  Recent Labs  06/16/16 0311 06/17/16 0500  WBC 5.3 4.5  HGB 8.2* 7.3*  HCT 26.0* 24.2*  PLT 160 200   BMET:  Recent Labs  06/16/16 0311 06/17/16 0500  NA 130* 132*  K 4.8 3.8  CL 98* 98*  CO2 25 30  GLUCOSE 102* 115*  BUN 65* 41*  CREATININE 4.83* 3.61*  CALCIUM 7.9* 7.9*   No results for input(s): PTH in the last 72 hours. Iron Studies: No results for input(s): IRON, TIBC, TRANSFERRIN, FERRITIN in the last 72 hours. Studies/Results: Dg Chest Port 1 View  Result Date: 06/17/2016 CLINICAL DATA:  Pt has had a fever and is having tenderness throughout his abdomen per MD. Pt cannot relay hx. Pt has shingles EXAM: PORTABLE CHEST 1 VIEW COMPARISON:  06/16/2016 FINDINGS: Tracheostomy tube, feeding tube and central venous line not changed. Bilateral pleural effusions RIGHT greater than LEFT. Mild central venous congestion. No pneumothorax IMPRESSION: 1. Stable support apparatus. 2. Bilateral pleural effusions and central venous congestion increased from prior. Electronically Signed   By:  Stewart  Edmunds M.D.   On: 06/17/2016 10:08   Dg Shoulder Right Port  Result Date: 06/16/2016 CLINICAL DATA:  70-year-old male with a history of prior right humeral fracture EXAM: PORTABLE RIGHT SHOULDER COMPARISON:  Plain film 05/30/2016 FINDINGS: Oblique fracture of the proximal right humerus again demonstrated without significant callus formation. Unchanged alignment of the fracture fragments. Degenerative changes at the glenohumeral joint and acromioclavicular joint. Right IJ central catheter in place without pneumothorax. Tracheostomy partially imaged.  Enteric tube partially imaged. IMPRESSION: Similar appearance of oblique fracture proximal right humerus, without significant callus formation in the interval and no change in the fracture alignment. Degenerative changes of the glenohumeral joint and acromioclavicular joint. Surgical tubes and lines partially image, as above. Electronically Signed   By: Jaime  Wagner D.O.   On: 06/16/2016 13:16   Dg Abd Portable 1v  Result Date: 06/17/2016 CLINICAL DATA:  70-year-old male with history of fever and abdominal tenderness this morning. EXAM: PORTABLE ABDOMEN - 1 VIEW COMPARISON:  05/25/2016. FINDINGS: Feeding tube coiled in the stomach with tip in the antral pre-pyloric region. Gas and stool are seen scattered throughout the colon extending to the level of the distal rectum. No pathologic distension of small bowel is noted. No gross evidence of pneumoperitoneum. IMPRESSION: 1. Tip of feeding tube is in the antral pre-pyloric region of the stomach. 2. Nonobstructive bowel gas pattern. 3. No pneumoperitoneum. Electronically Signed   By: Daniel  Entrikin M.D.   On: 06/17/2016 10:14      Scheduled: . anticoagulant sodium citrate  10 mL Intravenous Once  . budesonide (PULMICORT) nebulizer solution  0.5 mg Nebulization BID  . cefTAZidime (FORTAZ)  IV  2 g Intravenous Q M,W,F-2000  . chlorhexidine gluconate (MEDLINE KIT)  15 mL Mouth Rinse BID  . [START ON  06/24/2016] darbepoetin (ARANESP) injection - DIALYSIS  200 mcg Intravenous Q Wed-HD  . feeding supplement (OSMOLITE 1.5 CAL)  1,000 mL Per Tube Q24H  . feeding supplement (PRO-STAT SUGAR FREE 64)  60 mL Per Tube TID  . insulin aspart  0-9 Units Subcutaneous Q4H  . ipratropium-albuterol  3 mL Nebulization BID  . levothyroxine  125 mcg Per Tube QAC breakfast  . mouth rinse  15 mL Mouth Rinse QID  . midodrine  15 mg Per Tube TID WC  . multivitamin  1 tablet Oral QHS  . pantoprazole sodium  40 mg Per Tube Daily  . [START ON 06/19/2016] vancomycin  1,000 mg Intravenous Q M,W,F-HD     LOS: 40 days   , C 06/17/2016,4:02 PM  

## 2016-06-18 ENCOUNTER — Inpatient Hospital Stay (HOSPITAL_COMMUNITY): Payer: Medicare Other

## 2016-06-18 DIAGNOSIS — R7881 Bacteremia: Secondary | ICD-10-CM

## 2016-06-18 DIAGNOSIS — J90 Pleural effusion, not elsewhere classified: Secondary | ICD-10-CM

## 2016-06-18 DIAGNOSIS — I4891 Unspecified atrial fibrillation: Secondary | ICD-10-CM

## 2016-06-18 DIAGNOSIS — M79603 Pain in arm, unspecified: Secondary | ICD-10-CM

## 2016-06-18 DIAGNOSIS — R509 Fever, unspecified: Secondary | ICD-10-CM

## 2016-06-18 LAB — SEDIMENTATION RATE: Sed Rate: 86 mm/hr — ABNORMAL HIGH (ref 0–16)

## 2016-06-18 LAB — COMPREHENSIVE METABOLIC PANEL
ALT: 7 U/L — ABNORMAL LOW (ref 17–63)
AST: 25 U/L (ref 15–41)
Albumin: 1.9 g/dL — ABNORMAL LOW (ref 3.5–5.0)
Alkaline Phosphatase: 84 U/L (ref 38–126)
Anion gap: 6 (ref 5–15)
BUN: 79 mg/dL — ABNORMAL HIGH (ref 6–20)
CHLORIDE: 96 mmol/L — AB (ref 101–111)
CO2: 28 mmol/L (ref 22–32)
Calcium: 8.1 mg/dL — ABNORMAL LOW (ref 8.9–10.3)
Creatinine, Ser: 5.19 mg/dL — ABNORMAL HIGH (ref 0.61–1.24)
GFR, EST AFRICAN AMERICAN: 12 mL/min — AB (ref >60)
GFR, EST NON AFRICAN AMERICAN: 10 mL/min — AB (ref >60)
Glucose, Bld: 104 mg/dL — ABNORMAL HIGH (ref 65–99)
POTASSIUM: 4.1 mmol/L (ref 3.5–5.1)
Sodium: 130 mmol/L — ABNORMAL LOW (ref 135–145)
Total Bilirubin: 1.3 mg/dL — ABNORMAL HIGH (ref 0.3–1.2)
Total Protein: 7.1 g/dL (ref 6.5–8.1)

## 2016-06-18 LAB — RENAL FUNCTION PANEL
ALBUMIN: 1.9 g/dL — AB (ref 3.5–5.0)
ALBUMIN: 1.9 g/dL — AB (ref 3.5–5.0)
ANION GAP: 7 (ref 5–15)
ANION GAP: 10 (ref 5–15)
BUN: 77 mg/dL — AB (ref 6–20)
BUN: 84 mg/dL — ABNORMAL HIGH (ref 6–20)
CALCIUM: 8.2 mg/dL — AB (ref 8.9–10.3)
CO2: 28 mmol/L (ref 22–32)
CO2: 27 mmol/L (ref 22–32)
Calcium: 8.4 mg/dL — ABNORMAL LOW (ref 8.9–10.3)
Chloride: 95 mmol/L — ABNORMAL LOW (ref 101–111)
Chloride: 95 mmol/L — ABNORMAL LOW (ref 101–111)
Creatinine, Ser: 5 mg/dL — ABNORMAL HIGH (ref 0.61–1.24)
Creatinine, Ser: 5.74 mg/dL — ABNORMAL HIGH (ref 0.61–1.24)
GFR calc non Af Amer: 10 mL/min — ABNORMAL LOW (ref >60)
GFR, EST AFRICAN AMERICAN: 12 mL/min — AB (ref >60)
GFR, EST AFRICAN AMERICAN: 10 mL/min — AB (ref >60)
GFR, EST NON AFRICAN AMERICAN: 9 mL/min — AB (ref >60)
GLUCOSE: 101 mg/dL — AB (ref 65–99)
Glucose, Bld: 101 mg/dL — ABNORMAL HIGH (ref 65–99)
PHOSPHORUS: 7.3 mg/dL — AB (ref 2.5–4.6)
PHOSPHORUS: 6.9 mg/dL — AB (ref 2.5–4.6)
POTASSIUM: 4.1 mmol/L (ref 3.5–5.1)
Potassium: 4 mmol/L (ref 3.5–5.1)
SODIUM: 130 mmol/L — AB (ref 135–145)
Sodium: 132 mmol/L — ABNORMAL LOW (ref 135–145)

## 2016-06-18 LAB — BLOOD CULTURE ID PANEL (REFLEXED)
Acinetobacter baumannii: NOT DETECTED
CANDIDA GLABRATA: NOT DETECTED
CANDIDA KRUSEI: NOT DETECTED
CANDIDA PARAPSILOSIS: NOT DETECTED
CANDIDA TROPICALIS: NOT DETECTED
Candida albicans: NOT DETECTED
ENTEROBACTER CLOACAE COMPLEX: NOT DETECTED
ESCHERICHIA COLI: NOT DETECTED
Enterobacteriaceae species: NOT DETECTED
Enterococcus species: NOT DETECTED
Haemophilus influenzae: NOT DETECTED
KLEBSIELLA OXYTOCA: NOT DETECTED
KLEBSIELLA PNEUMONIAE: NOT DETECTED
Listeria monocytogenes: NOT DETECTED
Methicillin resistance: DETECTED — AB
Neisseria meningitidis: NOT DETECTED
PROTEUS SPECIES: NOT DETECTED
Pseudomonas aeruginosa: NOT DETECTED
STREPTOCOCCUS AGALACTIAE: NOT DETECTED
Serratia marcescens: NOT DETECTED
Staphylococcus aureus (BCID): NOT DETECTED
Staphylococcus species: DETECTED — AB
Streptococcus pneumoniae: NOT DETECTED
Streptococcus pyogenes: NOT DETECTED
Streptococcus species: NOT DETECTED

## 2016-06-18 LAB — CBC
HCT: 27 % — ABNORMAL LOW (ref 39.0–52.0)
HEMATOCRIT: 27.4 % — AB (ref 39.0–52.0)
HEMOGLOBIN: 8.4 g/dL — AB (ref 13.0–17.0)
HEMOGLOBIN: 8.6 g/dL — AB (ref 13.0–17.0)
MCH: 34.6 pg — ABNORMAL HIGH (ref 26.0–34.0)
MCH: 34.8 pg — ABNORMAL HIGH (ref 26.0–34.0)
MCHC: 31.1 g/dL (ref 30.0–36.0)
MCHC: 31.4 g/dL (ref 30.0–36.0)
MCV: 111.1 fL — ABNORMAL HIGH (ref 78.0–100.0)
MCV: 110.9 fL — ABNORMAL HIGH (ref 78.0–100.0)
Platelets: 263 10*3/uL (ref 150–400)
Platelets: 290 10*3/uL (ref 150–400)
RBC: 2.43 MIL/uL — ABNORMAL LOW (ref 4.22–5.81)
RBC: 2.47 MIL/uL — ABNORMAL LOW (ref 4.22–5.81)
RDW: 21 % — AB (ref 11.5–15.5)
RDW: 20.9 % — AB (ref 11.5–15.5)
WBC: 4.3 10*3/uL (ref 4.0–10.5)
WBC: 3.5 10*3/uL — AB (ref 4.0–10.5)

## 2016-06-18 LAB — CULTURE, BLOOD (ROUTINE X 2)
CULTURE: NO GROWTH
Culture: NO GROWTH
Special Requests: ADEQUATE

## 2016-06-18 LAB — GLUCOSE, CAPILLARY
GLUCOSE-CAPILLARY: 93 mg/dL (ref 65–99)
GLUCOSE-CAPILLARY: 91 mg/dL (ref 65–99)
Glucose-Capillary: 111 mg/dL — ABNORMAL HIGH (ref 65–99)
Glucose-Capillary: 98 mg/dL (ref 65–99)
Glucose-Capillary: 122 mg/dL — ABNORMAL HIGH (ref 65–99)
Glucose-Capillary: 97 mg/dL (ref 65–99)

## 2016-06-18 LAB — C-REACTIVE PROTEIN: CRP: 4.1 mg/dL — AB (ref <1.0)

## 2016-06-18 MED ORDER — SODIUM CHLORIDE 0.9 % IV SOLN: 100.0000 mL | INTRAVENOUS | Status: DC | PRN

## 2016-06-18 MED ORDER — PENTAFLUOROPROP-TETRAFLUOROETH EX AERO: 1.0000 "application " | INHALATION_SPRAY | CUTANEOUS | Status: DC | PRN

## 2016-06-18 MED ORDER — HEPARIN SODIUM (PORCINE) 1000 UNIT/ML DIALYSIS: 20.0000 [IU]/kg | INTRAMUSCULAR | Status: DC | PRN

## 2016-06-18 MED ORDER — ALTEPLASE 2 MG IJ SOLR: 2.0000 mg | Freq: Once | INTRAMUSCULAR | Status: DC | PRN

## 2016-06-18 MED ORDER — DEXTROSE 5 % IV SOLN
5.0000 mg/kg | Freq: Once | INTRAVENOUS | Status: AC
  Administered 2016-06-18: 500 mg via INTRAVENOUS
  Filled 2016-06-18: qty 10

## 2016-06-18 MED ORDER — LIDOCAINE-PRILOCAINE 2.5-2.5 % EX CREA: 1.0000 "application " | TOPICAL_CREAM | CUTANEOUS | Status: DC | PRN

## 2016-06-18 MED ORDER — AMPICILLIN-SULBACTAM SODIUM 3 (2-1) G IJ SOLR
3.0000 g | Freq: Every day | INTRAMUSCULAR | Status: DC
  Administered 2016-06-18: 3 g via INTRAVENOUS
  Filled 2016-06-18: qty 3

## 2016-06-18 MED ORDER — HEPARIN SODIUM (PORCINE) 1000 UNIT/ML DIALYSIS: 1000.0000 [IU] | INTRAMUSCULAR | Status: DC | PRN

## 2016-06-18 MED ORDER — DEXTROSE 5 % IV SOLN
5.0000 mg/kg | INTRAVENOUS | Status: DC
  Administered 2016-06-19: 500 mg via INTRAVENOUS
  Filled 2016-06-18 (×3): qty 10

## 2016-06-18 MED ORDER — LIDOCAINE HCL (PF) 1 % IJ SOLN: 5.0000 mL | INTRAMUSCULAR | Status: DC | PRN

## 2016-06-18 NOTE — Progress Notes (Signed)
Subjective:  Patient is confused. Antibiotics:  Anti-infectives    Start     Dose/Rate Route Frequency Ordered Stop   06/19/16 2200  acyclovir (ZOVIRAX) 500 mg in dextrose 5 % 100 mL IVPB     5 mg/kg  100 kg (Adjusted) 110 mL/hr over 60 Minutes Intravenous Every 24 hours 06/18/16 1135     06/19/16 1200  vancomycin (VANCOCIN) IVPB 1000 mg/200 mL premix     1,000 mg 200 mL/hr over 60 Minutes Intravenous Every M-W-F (Hemodialysis) 06/16/16 1255     06/18/16 1230  acyclovir (ZOVIRAX) 500 mg in dextrose 5 % 100 mL IVPB     5 mg/kg  100 kg (Adjusted) 110 mL/hr over 60 Minutes Intravenous  Once 06/18/16 1125     06/16/16 2200  acyclovir (ZOVIRAX) tablet 600 mg  Status:  Discontinued     600 mg Oral Once per day on Mon Wed Fri 06/15/16 1126 06/15/16 1145   06/16/16 2200  acyclovir (ZOVIRAX) 200 MG capsule 600 mg  Status:  Discontinued     600 mg Oral Once per day on Mon Wed Fri 06/15/16 1443 06/17/16 1326   06/16/16 2000  ceftAZIDime (FORTAZ) IVPB 2 g  Status:  Discontinued     2 g 100 mL/hr over 30 Minutes Intravenous Every M-W-F (2000) 06/15/16 1438 06/16/16 1652   06/16/16 2000  cefTAZidime (FORTAZ) 2 g in dextrose 5 % 50 mL IVPB  Status:  Discontinued     2 g 100 mL/hr over 30 Minutes Intravenous Every M-W-F (2000) 06/16/16 1652 06/18/16 0947   06/16/16 1200  vancomycin (VANCOCIN) IVPB 750 mg/150 ml premix     750 mg 150 mL/hr over 60 Minutes Intravenous Every Fri (Hemodialysis) 06/16/16 1157 06/16/16 1421   06/16/16 1000  acyclovir (ZOVIRAX) 200 MG capsule 600 mg  Status:  Discontinued     600 mg Oral Once per day on Mon Wed Fri 06/15/16 1145 06/15/16 1443   06/16/16 1000  acyclovir (ZOVIRAX) tablet 200 mg  Status:  Discontinued     200 mg Oral Daily 06/15/16 1435 06/16/16 0415   06/16/16 1000  acyclovir (ZOVIRAX) 200 MG capsule 200 mg  Status:  Discontinued     200 mg Oral Daily 06/16/16 0416 06/17/16 1326   06/15/16 2200  acyclovir (ZOVIRAX) 200 MG capsule 200 mg   Status:  Discontinued     200 mg Oral 2 times daily 06/15/16 0801 06/15/16 1126   06/15/16 2200  acyclovir (ZOVIRAX) tablet 200 mg  Status:  Discontinued     200 mg Oral Once per day on Sun Tue Thu Sat 06/15/16 1126 06/15/16 1144   06/15/16 2200  acyclovir (ZOVIRAX) 200 MG capsule 200 mg  Status:  Discontinued     200 mg Oral Once per day on Sun Tue Thu Sat 06/15/16 1144 06/17/16 1326   06/15/16 1200  acyclovir (ZOVIRAX) 200 MG capsule 200 mg  Status:  Discontinued     200 mg Oral Daily 06/15/16 1141 06/15/16 1435   06/15/16 1130  acyclovir (ZOVIRAX) tablet 400 mg     400 mg Oral  Once 06/15/16 1117 06/15/16 1446   06/15/16 1130  Ceftazidime (FORTAZ) injection 2 g  Status:  Discontinued     2 g Intravenous  Once 06/15/16 1117 06/15/16 1118   06/15/16 1130  cefTAZidime (FORTAZ) 2 g in dextrose 5 % 50 mL IVPB     2 g 100 mL/hr over 30 Minutes Intravenous  Once 06/15/16  1122 06/15/16 1240   06/15/16 1130  acyclovir (ZOVIRAX) tablet 200 mg  Status:  Discontinued     200 mg Oral  Every morning - 10a 06/15/16 1126 06/15/16 1141   06/15/16 0900  cefTAZidime (FORTAZ) 2 g in dextrose 5 % 50 mL IVPB  Status:  Discontinued     2 g 100 mL/hr over 30 Minutes Intravenous  Once 06/15/16 0720 06/15/16 1117   06/15/16 0900  acyclovir (ZOVIRAX) 200 MG capsule 400 mg  Status:  Discontinued     400 mg Oral  Once 06/15/16 0801 06/15/16 1117   06/15/16 0000  acyclovir (ZOVIRAX) 200 MG capsule 200 mg  Status:  Discontinued     200 mg Oral 2 times daily 06/14/16 2353 06/15/16 0801   06/14/16 2200  acyclovir (ZOVIRAX) tablet 200 mg  Status:  Discontinued     200 mg Oral 2 times daily 06/14/16 1533 06/14/16 2353   06/14/16 1800  cefTAZidime (FORTAZ) 2 g in dextrose 5 % 50 mL IVPB  Status:  Discontinued     2 g 100 mL/hr over 30 Minutes Intravenous Every M-W-F (Hemodialysis) 06/13/16 1338 06/15/16 1438   06/14/16 1600  acyclovir (ZOVIRAX) tablet 400 mg  Status:  Discontinued     400 mg Oral  Once 06/14/16 1533  06/15/16 0801   06/14/16 1200  vancomycin (VANCOCIN) IVPB 1000 mg/200 mL premix  Status:  Discontinued     1,000 mg 200 mL/hr over 60 Minutes Intravenous Every M-W-F (Hemodialysis) 06/13/16 1338 06/16/16 1157   06/13/16 1300  vancomycin (VANCOCIN) 2,000 mg in sodium chloride 0.9 % 500 mL IVPB     2,000 mg 250 mL/hr over 120 Minutes Intravenous  Once 06/13/16 1244 06/13/16 1645   06/13/16 1300  cefTAZidime (FORTAZ) 2 g in dextrose 5 % 50 mL IVPB     2 g 100 mL/hr over 30 Minutes Intravenous  Once 06/13/16 1244 06/13/16 1506   06/12/16 1448  ceFAZolin (ANCEF) 2-4 GM/100ML-% IVPB  Status:  Discontinued    Comments:  Shaaron Adler   : cabinet override      06/12/16 1448 06/12/16 1454   06/12/16 1432  ceFAZolin (ANCEF) 2-4 GM/100ML-% IVPB    Comments:  Laughlin, Amy   : cabinet override      06/12/16 1432 06/12/16 1436   06/29/2016 0900  meropenem (MERREM) 500 mg in sodium chloride 0.9 % 50 mL IVPB     500 mg 100 mL/hr over 30 Minutes Intravenous Every 24 hours 06/07/16 0955 06/11/16 1109   07/01/2016 0600  cefoTEtan (CEFOTAN) 2 g in dextrose 5 % 50 mL IVPB  Status:  Discontinued     2 g 100 mL/hr over 30 Minutes Intravenous To ShortStay [facility] 06/07/16 1354 06/27/2016 1157   06/06/16 1200  vancomycin (VANCOCIN) 1,500 mg in sodium chloride 0.9 % 500 mL IVPB  Status:  Discontinued     1,500 mg 250 mL/hr over 120 Minutes Intravenous Every 24 hours 06/06/16 0818 06/07/16 0954   06/05/16 1000  meropenem (MERREM) 1 g in sodium chloride 0.9 % 100 mL IVPB  Status:  Discontinued     1 g 200 mL/hr over 30 Minutes Intravenous Every 12 hours 06/05/16 0926 06/07/16 0955   06/04/16 1400  vancomycin (VANCOCIN) 2,000 mg in sodium chloride 0.9 % 500 mL IVPB     2,000 mg 250 mL/hr over 120 Minutes Intravenous  Once 06/04/16 1234 06/04/16 1737   06/04/16 1400  meropenem (MERREM) 500 mg in sodium chloride 0.9 % 50  mL IVPB  Status:  Discontinued     500 mg 100 mL/hr over 30 Minutes Intravenous Every 24 hours  06/04/16 1234 06/05/16 0926   05/30/16 2300  ceFEPIme (MAXIPIME) 1 g in dextrose 5 % 50 mL IVPB  Status:  Discontinued     1 g 100 mL/hr over 30 Minutes Intravenous Every 24 hours 05/30/16 1223 06/04/16 1231   05/27/16 1400  ceFEPIme (MAXIPIME) 2 g in dextrose 5 % 50 mL IVPB  Status:  Discontinued     2 g 100 mL/hr over 30 Minutes Intravenous Every 12 hours 05/27/16 1014 05/30/16 1223   05/27/16 1400  vancomycin (VANCOCIN) 1,500 mg in sodium chloride 0.9 % 500 mL IVPB  Status:  Discontinued     1,500 mg 250 mL/hr over 120 Minutes Intravenous Every 24 hours 05/27/16 1014 05/30/16 1224   05/26/16 1300  ceFEPIme (MAXIPIME) 1 g in dextrose 5 % 50 mL IVPB  Status:  Discontinued     1 g 100 mL/hr over 30 Minutes Intravenous Every 24 hours 05/26/16 1225 05/27/16 1014   05/26/16 1230  vancomycin (VANCOCIN) 2,500 mg in sodium chloride 0.9 % 500 mL IVPB     2,500 mg 250 mL/hr over 120 Minutes Intravenous  Once 05/26/16 1225 05/26/16 1530   05/16/16 1400  cefTRIAXone (ROCEPHIN) 2 g in dextrose 5 % 50 mL IVPB     2 g 100 mL/hr over 30 Minutes Intravenous Every 24 hours 05/15/16 1649 05/17/16 1458   05/11/16 1400  cefTRIAXone (ROCEPHIN) 1 g in dextrose 5 % 50 mL IVPB  Status:  Discontinued     1 g 100 mL/hr over 30 Minutes Intravenous Every 24 hours 05/11/16 0833 05/15/16 1649   05/09/16 1400  piperacillin-tazobactam (ZOSYN) IVPB 3.375 g  Status:  Discontinued     3.375 g 12.5 mL/hr over 240 Minutes Intravenous Every 8 hours 05/09/16 1114 05/11/16 0833      Medications: Scheduled Meds: . acyclovir  5 mg/kg (Adjusted) Intravenous Once  . [START ON 06/19/2016] acyclovir  5 mg/kg (Adjusted) Intravenous Q24H  . anticoagulant sodium citrate  10 mL Intravenous Once  . budesonide (PULMICORT) nebulizer solution  0.5 mg Nebulization BID  . chlorhexidine gluconate (MEDLINE KIT)  15 mL Mouth Rinse BID  . [START ON 06/28/16] darbepoetin (ARANESP) injection - DIALYSIS  200 mcg Intravenous Q Wed-HD  .  feeding supplement (OSMOLITE 1.5 CAL)  1,000 mL Per Tube Q24H  . feeding supplement (PRO-STAT SUGAR FREE 64)  60 mL Per Tube TID  . insulin aspart  0-9 Units Subcutaneous Q4H  . ipratropium-albuterol  3 mL Nebulization BID  . levothyroxine  125 mcg Per Tube QAC breakfast  . mouth rinse  15 mL Mouth Rinse QID  . midodrine  15 mg Per Tube TID WC  . multivitamin  1 tablet Oral QHS  . pantoprazole sodium  40 mg Per Tube Daily  . [START ON 06/19/2016] vancomycin  1,000 mg Intravenous Q M,W,F-HD   Continuous Infusions: . sodium chloride 10 mL/hr at 06/03/16 2000  . sodium chloride Stopped (06/06/16 0700)   PRN Meds:.Place/Maintain arterial line **AND** sodium chloride, sodium chloride, sodium chloride, acetaminophen (TYLENOL) oral liquid 160 mg/5 mL, acetaminophen, albuterol, alteplase, docusate, heparin, heparin, ipratropium-albuterol, lidocaine (PF), lidocaine-prilocaine, metoprolol, morphine injection, naLOXone (NARCAN)  injection, pentafluoroprop-tetrafluoroeth, RESOURCE THICKENUP CLEAR, sodium chloride flush    Objective: Weight change:   Intake/Output Summary (Last 24 hours) at 06/18/16 1316 Last data filed at 06/18/16 0800  Gross per 24 hour  Intake  1220 ml  Output                0 ml  Net             1220 ml   Blood pressure (!) 86/46, pulse (!) 114, temperature 99.3 F (37.4 C), temperature source Axillary, resp. rate 18, height '5\' 6"'  (1.676 m), weight (!) 340 lb (154.2 kg), SpO2 99 %. Temp:  [99.3 F (37.4 C)-101.7 F (38.7 C)] 99.3 F (37.4 C) (04/15 0848) Pulse Rate:  [94-130] 114 (04/15 1248) Resp:  [18-26] 18 (04/15 1248) BP: (86-165)/(35-143) 86/46 (04/15 1248) SpO2:  [85 %-100 %] 99 % (04/15 1248) FiO2 (%):  [28 %] 28 % (04/15 1248)  Physical Exam: eneral:awake and at times moves his left hand which is restrained, right in sling HEENT: anicteric sclera,  EOMI, tracheostomy Cardiovascular: tachy rate, normal r,  no murmur rubs or gallops Pulmonary:   rhonchi Gastrointestinal: soft  Slightly distended and diffusely tender   Musculoskeletal: no  clubbing or edema noted bilaterally Skin, zoster rash with bandage over most of it  06/17/16:     06/18/16:       Neuro: nonfocal  CBC: CBC Latest Ref Rng & Units 06/18/2016 06/17/2016 06/17/2016  WBC 4.0 - 10.5 K/uL 4.3 3.9(L) 4.5  Hemoglobin 13.0 - 17.0 g/dL 8.4(L) 7.7(L) 7.3(L)  Hematocrit 39.0 - 52.0 % 27.0(L) 24.2(L) 24.2(L)  Platelets 150 - 400 K/uL 263 225 200     BMET  Recent Labs  06/18/16 0839 06/18/16 0930  NA 130* 130*  K 4.0 4.1  CL 95* 96*  CO2 28 28  GLUCOSE 101* 104*  BUN 77* 79*  CREATININE 5.00* 5.19*  CALCIUM 8.2* 8.1*     Liver Panel   Recent Labs  06/18/16 0839 06/18/16 0930  PROT  --  7.1  ALBUMIN 1.9* 1.9*  AST  --  25  ALT  --  7*  ALKPHOS  --  84  BILITOT  --  1.3*       Sedimentation Rate  Recent Labs  06/18/16 0930  ESRSEDRATE 86*   C-Reactive Protein  Recent Labs  06/18/16 0930  CRP 4.1*    Micro Results: Recent Results (from the past 720 hour(s))  Culture, respiratory (NON-Expectorated)     Status: None   Collection Time: 05/26/16 12:03 PM  Result Value Ref Range Status   Specimen Description TRACHEAL ASPIRATE  Final   Special Requests NONE  Final   Gram Stain   Final    MODERATE RARE GRAM NEGATIVE RODS RARE GRAM POSITIVE COCCI IN PAIRS RARE GRAM POSITIVE RODS    Culture   Final    FEW DIPHTHEROIDS(CORYNEBACTERIUM SPECIES) Standardized susceptibility testing for this organism is not available.    Report Status 05/28/2016 FINAL  Final  Culture, respiratory (NON-Expectorated)     Status: None   Collection Time: 05/31/16 11:48 AM  Result Value Ref Range Status   Specimen Description TRACHEAL ASPIRATE  Final   Special Requests NONE  Final   Gram Stain   Final    ABUNDANT WBC PRESENT, PREDOMINANTLY PMN FEW SQUAMOUS EPITHELIAL CELLS PRESENT MODERATE GRAM NEGATIVE RODS FEW GRAM POSITIVE COCCI IN  PAIRS RARE GRAM POSITIVE RODS RARE YEAST    Culture Consistent with normal respiratory flora.  Final   Report Status 06/02/2016 FINAL  Final  Culture, blood (Routine X 2) w Reflex to ID Panel     Status: None   Collection Time: 06/04/16 12:45 PM  Result Value  Ref Range Status   Specimen Description BLOOD LEFT ARM  Final   Special Requests IN PEDIATRIC BOTTLE Blood Culture adequate volume  Final   Culture NO GROWTH 5 DAYS  Final   Report Status 06/09/2016 FINAL  Final  Culture, blood (Routine X 2) w Reflex to ID Panel     Status: None   Collection Time: 06/04/16 12:45 PM  Result Value Ref Range Status   Specimen Description BLOOD LEFT HAND  Final   Special Requests   Final    BOTTLES DRAWN AEROBIC ONLY Blood Culture adequate volume   Culture NO GROWTH 5 DAYS  Final   Report Status 06/09/2016 FINAL  Final  Culture, respiratory (NON-Expectorated)     Status: None   Collection Time: 06/04/16  4:22 PM  Result Value Ref Range Status   Specimen Description TRACHEAL ASPIRATE  Final   Special Requests NONE  Final   Gram Stain   Final    ABUNDANT WBC PRESENT, PREDOMINANTLY PMN FEW GRAM NEGATIVE RODS FEW GRAM POSITIVE COCCI IN CLUSTERS FEW GRAM POSITIVE COCCI IN PAIRS    Culture Consistent with normal respiratory flora.  Final   Report Status 06/07/2016 FINAL  Final  Culture, Urine     Status: None   Collection Time: 06/04/16  8:57 PM  Result Value Ref Range Status   Specimen Description URINE, RANDOM  Final   Special Requests NONE  Final   Culture NO GROWTH  Final   Report Status 06/06/2016 FINAL  Final  MRSA PCR Screening     Status: Abnormal   Collection Time: 06/11/16  4:18 AM  Result Value Ref Range Status   MRSA by PCR INVALID RESULTS, SPECIMEN SENT FOR CULTURE (A) NEGATIVE Final    Comment: NOTIFIED RN JENNIFER AT [id number] 06/11/16 BY A.DAVIS  MRSA culture     Status: None   Collection Time: 06/11/16  4:18 AM  Result Value Ref Range Status   Specimen Description NASAL SWAB   Final   Special Requests NONE  Final   Culture NO MRSA DETECTED  Final   Report Status 06/14/2016 FINAL  Final  Culture, Urine     Status: Abnormal   Collection Time: 06/13/16  8:08 AM  Result Value Ref Range Status   Specimen Description URINE, RANDOM  Final   Special Requests Normal  Final   Culture >=100,000 COLONIES/mL YEAST (A)  Final   Report Status 06/14/2016 FINAL  Final  Culture, respiratory (NON-Expectorated)     Status: None   Collection Time: 06/13/16  9:04 AM  Result Value Ref Range Status   Specimen Description TRACHEAL ASPIRATE  Final   Special Requests Normal  Final   Gram Stain   Final    MODERATE WBC PRESENT,BOTH PMN AND MONONUCLEAR FEW GRAM POSITIVE COCCI IN PAIRS IN CLUSTERS RARE GRAM NEGATIVE RODS    Culture Consistent with normal respiratory flora.  Final   Report Status 06/15/2016 FINAL  Final  Culture, blood (Routine X 2) w Reflex to ID Panel     Status: None (Preliminary result)   Collection Time: 06/13/16  9:44 AM  Result Value Ref Range Status   Specimen Description BLOOD LEFT ARM  Final   Special Requests IN PEDIATRIC BOTTLE Blood Culture adequate volume  Final   Culture NO GROWTH 4 DAYS  Final   Report Status PENDING  Incomplete  Culture, blood (Routine X 2) w Reflex to ID Panel     Status: None (Preliminary result)   Collection Time: 06/13/16 10:49  AM  Result Value Ref Range Status   Specimen Description BLOOD LEFT HAND  Final   Special Requests IN PEDIATRIC BOTTLE BCAV  Final   Culture NO GROWTH 4 DAYS  Final   Report Status PENDING  Incomplete  Culture, blood (Routine X 2) w Reflex to ID Panel     Status: None (Preliminary result)   Collection Time: 06/17/16  9:49 AM  Result Value Ref Range Status   Specimen Description BLOOD RIGHT HAND  Final   Special Requests IN PEDIATRIC BOTTLE Blood Culture adequate volume  Final   Culture  Setup Time   Final    GRAM POSITIVE COCCI IN CLUSTERS IN PEDIATRIC BOTTLE CRITICAL RESULT CALLED TO, READ BACK BY  AND VERIFIED WITH: PHARMD Saddie Benders [id number] MLM    Culture GRAM POSITIVE COCCI  Final   Report Status PENDING  Incomplete  Blood Culture ID Panel (Reflexed)     Status: Abnormal   Collection Time: 06/17/16  9:49 AM  Result Value Ref Range Status   Enterococcus species NOT DETECTED NOT DETECTED Final   Listeria monocytogenes NOT DETECTED NOT DETECTED Final   Staphylococcus species DETECTED (A) NOT DETECTED Final    Comment: Methicillin (oxacillin) resistant coagulase negative staphylococcus. Possible blood culture contaminant (unless isolated from more than one blood culture draw or clinical case suggests pathogenicity). No antibiotic treatment is indicated for blood  culture contaminants. CRITICAL RESULT CALLED TO, READ BACK BY AND VERIFIED WITH: PHARMD N GAZDA [id number] MLM    Staphylococcus aureus NOT DETECTED NOT DETECTED Final   Methicillin resistance DETECTED (A) NOT DETECTED Final    Comment: CRITICAL RESULT CALLED TO, READ BACK BY AND VERIFIED WITH: PHARMD N GAZDA [id number] MLM    Streptococcus species NOT DETECTED NOT DETECTED Final   Streptococcus agalactiae NOT DETECTED NOT DETECTED Final   Streptococcus pneumoniae NOT DETECTED NOT DETECTED Final   Streptococcus pyogenes NOT DETECTED NOT DETECTED Final   Acinetobacter baumannii NOT DETECTED NOT DETECTED Final   Enterobacteriaceae species NOT DETECTED NOT DETECTED Final   Enterobacter cloacae complex NOT DETECTED NOT DETECTED Final   Escherichia coli NOT DETECTED NOT DETECTED Final   Klebsiella oxytoca NOT DETECTED NOT DETECTED Final   Klebsiella pneumoniae NOT DETECTED NOT DETECTED Final   Proteus species NOT DETECTED NOT DETECTED Final   Serratia marcescens NOT DETECTED NOT DETECTED Final   Haemophilus influenzae NOT DETECTED NOT DETECTED Final   Neisseria meningitidis NOT DETECTED NOT DETECTED Final   Pseudomonas aeruginosa NOT DETECTED NOT DETECTED Final   Candida albicans NOT DETECTED NOT DETECTED Final    Candida glabrata NOT DETECTED NOT DETECTED Final   Candida krusei NOT DETECTED NOT DETECTED Final   Candida parapsilosis NOT DETECTED NOT DETECTED Final   Candida tropicalis NOT DETECTED NOT DETECTED Final    Studies/Results: Ct Abdomen Pelvis Wo Contrast  Result Date: 06/17/2016 CLINICAL DATA:  Fever of unknown origin. Morbid obesity. Thrombocytopenia. EXAM: CT CHEST, ABDOMEN AND PELVIS WITHOUT CONTRAST TECHNIQUE: Multidetector CT imaging of the chest, abdomen and pelvis was performed following the standard protocol without IV contrast. COMPARISON:  Abdominopelvic CT of 06/06/2016. Chest radiograph 06/17/2016. No comparison chest CT. FINDINGS: Multifactorial degradation, including patient morbid obesity, arms not raised above the head, lack of IV contrast, and mild motion. CT CHEST FINDINGS Cardiovascular: Left-sided central line which terminates at the high right atrium. Mild cardiomegaly. Right-sided central line terminates at the high SVC. Tortuous thoracic aorta. Pulmonary artery enlargement, 4.0 cm outflow tract. Mediastinum/Nodes: Borderline right paratracheal  adenopathy at 10 mm. A node within the azygoesophageal recess measures 9 mm. Hilar regions poorly evaluated without intravenous contrast. Lungs/Pleura: Small right pleural effusion is grossly similar. Tracheostomy. Right lower lobe airspace disease. More patchy posterior left upper and lower lobe airspace opacities. Musculoskeletal: No acute osseous abnormality. CT ABDOMEN PELVIS FINDINGS Hepatobiliary: Markedly limited evaluation of the liver. Lesion described in the central right lobe on 05/20/2016 is not readily apparent. Multiple small gallstones without surrounding inflammation or biliary duct dilatation. Pancreas: Normal, without mass or ductal dilatation. Spleen: Normal in size, without focal abnormality. Adrenals/Urinary Tract: Normal adrenal glands. No renal calculi or hydronephrosis. 12 mm left renal interpolar exophytic lesion  measures greater than fluid density on image 66/ series 3. This is similar in size to on the prior exam. Interpolar right renal lesion measures 2.4 cm and borderline greater than fluid density. Normal urinary bladder. Stomach/Bowel: A nasogastric tube is looped in the stomach, terminating in the gastric body. Catheter in the rectum. Scattered colonic diverticula. Normal small bowel caliber. Vascular/Lymphatic: Normal aortic caliber. 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 The left common iliac is ectatic at 1.9 cm. No gross abdominopelvic adenopathy. Reproductive: Normal prostate. Other: Small bilateral fat containing inguinal hernias. Right sided retroperitoneal complex fluid collection is again identified. This is slightly decreased compared to 05/20/2016. Example 16.7 cm today versus 18.4 cm at the same level. This extends into the pelvis as before. Trace left pericolic gutter fluid is similar.  Anasarca. Musculoskeletal: Bilateral hip osteoarthritis. IMPRESSION: 1. Moderate-to-marked degradation, as detailed above. 2. Similar right pleural effusion with right lower lobe collapse/consolidation which could represent infection or atelectasis. More patchy left-sided airspace opacities are suspicious for infection or aspiration. 3. No explanation for fever within the abdomen or pelvis. 4. Slight decrease in size of a large right-sided retroperitoneal hematoma. 5. Cholelithiasis. 6. Pulmonary artery enlargement suggests pulmonary arterial hypertension. 7. Mild thoracic adenopathy, likely reactive. 8. Bilateral common iliac artery dilatation. 9. Bilateral indeterminate renal lesions. These could be re-evaluated with follow-up pre and post contrast CT or MRI (as an outpatient) Electronically Signed   By: Abigail Miyamoto M.D.   On: 06/17/2016 21:03   Ct Chest Wo Contrast  Result Date: 06/17/2016 CLINICAL DATA:  Fever of unknown origin. Morbid obesity. Thrombocytopenia. EXAM: CT CHEST, ABDOMEN AND PELVIS WITHOUT  CONTRAST TECHNIQUE: Multidetector CT imaging of the chest, abdomen and pelvis was performed following the standard protocol without IV contrast. COMPARISON:  Abdominopelvic CT of 06/06/2016. Chest radiograph 06/17/2016. No comparison chest CT. FINDINGS: Multifactorial degradation, including patient morbid obesity, arms not raised above the head, lack of IV contrast, and mild motion. CT CHEST FINDINGS Cardiovascular: Left-sided central line which terminates at the high right atrium. Mild cardiomegaly. Right-sided central line terminates at the high SVC. Tortuous thoracic aorta. Pulmonary artery enlargement, 4.0 cm outflow tract. Mediastinum/Nodes: Borderline right paratracheal adenopathy at 10 mm. A node within the azygoesophageal recess measures 9 mm. Hilar regions poorly evaluated without intravenous contrast. Lungs/Pleura: Small right pleural effusion is grossly similar. Tracheostomy. Right lower lobe airspace disease. More patchy posterior left upper and lower lobe airspace opacities. Musculoskeletal: No acute osseous abnormality. CT ABDOMEN PELVIS FINDINGS Hepatobiliary: Markedly limited evaluation of the liver. Lesion described in the central right lobe on 05/20/2016 is not readily apparent. Multiple small gallstones without surrounding inflammation or biliary duct dilatation. Pancreas: Normal, without mass or ductal dilatation. Spleen: Normal in size, without focal abnormality. Adrenals/Urinary Tract: Normal adrenal glands. No renal calculi or hydronephrosis. 12 mm left  renal interpolar exophytic lesion measures greater than fluid density on image 66/ series 3. This is similar in size to on the prior exam. Interpolar right renal lesion measures 2.4 cm and borderline greater than fluid density. Normal urinary bladder. Stomach/Bowel: A nasogastric tube is looped in the stomach, terminating in the gastric body. Catheter in the rectum. Scattered colonic diverticula. Normal small bowel caliber. Vascular/Lymphatic:  Normal aortic caliber. 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 The left common iliac is ectatic at 1.9 cm. No gross abdominopelvic adenopathy. Reproductive: Normal prostate. Other: Small bilateral fat containing inguinal hernias. Right sided retroperitoneal complex fluid collection is again identified. This is slightly decreased compared to 05/20/2016. Example 16.7 cm today versus 18.4 cm at the same level. This extends into the pelvis as before. Trace left pericolic gutter fluid is similar.  Anasarca. Musculoskeletal: Bilateral hip osteoarthritis. IMPRESSION: 1. Moderate-to-marked degradation, as detailed above. 2. Similar right pleural effusion with right lower lobe collapse/consolidation which could represent infection or atelectasis. More patchy left-sided airspace opacities are suspicious for infection or aspiration. 3. No explanation for fever within the abdomen or pelvis. 4. Slight decrease in size of a large right-sided retroperitoneal hematoma. 5. Cholelithiasis. 6. Pulmonary artery enlargement suggests pulmonary arterial hypertension. 7. Mild thoracic adenopathy, likely reactive. 8. Bilateral common iliac artery dilatation. 9. Bilateral indeterminate renal lesions. These could be re-evaluated with follow-up pre and post contrast CT or MRI (as an outpatient) Electronically Signed   By: Abigail Miyamoto M.D.   On: 06/17/2016 21:03   Dg Chest Port 1 View  Result Date: 06/17/2016 CLINICAL DATA:  Pt has had a fever and is having tenderness throughout his abdomen per MD. Pt cannot relay hx. Pt has shingles EXAM: PORTABLE CHEST 1 VIEW COMPARISON:  06/16/2016 FINDINGS: Tracheostomy tube, feeding tube and central venous line not changed. Bilateral pleural effusions RIGHT greater than LEFT. Mild central venous congestion. No pneumothorax IMPRESSION: 1. Stable support apparatus. 2. Bilateral pleural effusions and central venous congestion increased from prior. Electronically Signed   By: Suzy Bouchard M.D.    On: 06/17/2016 10:08   Dg Abd Portable 1v  Result Date: 06/17/2016 CLINICAL DATA:  71 year old male with history of fever and abdominal tenderness this morning. EXAM: PORTABLE ABDOMEN - 1 VIEW COMPARISON:  05/25/2016. FINDINGS: Feeding tube coiled in the stomach with tip in the antral pre-pyloric region. Gas and stool are seen scattered throughout the colon extending to the level of the distal rectum. No pathologic distension of small bowel is noted. No gross evidence of pneumoperitoneum. IMPRESSION: 1. Tip of feeding tube is in the antral pre-pyloric region of the stomach. 2. Nonobstructive bowel gas pattern. 3. No pneumoperitoneum. Electronically Signed   By: Vinnie Langton M.D.   On: 06/17/2016 10:14      Assessment/Plan:  INTERVAL HISTORY: Blood culture + for MR Coag negative staph, I cannot see what is the issue with 2nd set was another one not drawn?   Active Problems:   Paroxysmal atrial fibrillation (HCC)   Hemiparesis affecting right side as late effect of cerebrovascular accident [facility])   Hypothyroidism   Acute respiratory failure with hypoxemia (HCC)   Shock circulatory (HCC)   Altered mental status   Retroperitoneal hemorrhage   Goals of care, counseling/discussion   On enteral nutrition   Acute renal failure (ARF) (HCC)   Electrolyte imbalance   Shock liver   Current use of proton pump inhibitor   Contraindication to deep vein thrombosis (DVT) prophylaxis   Hepatic lesion  Palliative care encounter   Acute respiratory failure with hypercapnia (HCC)   AKI (acute kidney injury) ([place])   Stage 5 chronic kidney disease on chronic dialysis ([place])   HCAP (healthcare-associated pneumonia)   Palliative care by specialist   Tracheostomy care ([place])   Pressure injury of skin   Abdominal pain   FUO (fever of unknown origin)   Proximal humerus fracture    Omar [name] is a 71 y.o. male with  with [facility] with AMS and H. Flu  pneumonia, septic shock. His course was complicated by retroperitoneal bleed while on heparin with subsequent shock, acute renal failure &prolonged ventilation requiring tracheostomy. On CRRT with persistent fevers now with zoster rash that is improving  #1 FUO:   He does have + blood culture for MR Coag Negative but NOT the other. Therefore this is very likely a contaminant   He does have a pleural effusion which could be aspirated for diagnostic and therapeutic reasons. With cell count differential LDH protein and cholesterol levels being sent on it.  I have discontinued his vancomycin and IV discontinued ceftazidime place him on Unasyn in case he does have an aspiration pneumonia with a parapneumonic empyema although I am skeptical of this.  I'll continue acyclovir for his zoster.  #2 coag negative staph in blood equal likely contaminant being from one of 2  #3 possible pneumonia plus pleural effusion. He has been on protracted antibiotics already and less his effusion is due to an empyema I doubt he would require further antibiotics.  #4 Low BP: not clear to me that this is due to infection given multiple other factors including retroperitoneal bleed  Dr. Johnnye Sima to take over the service tomorrow.     LOS: 41 days   Alcide Evener 06/18/2016, 1:16 PM

## 2016-06-18 NOTE — Progress Notes (Signed)
PHARMACY - PHYSICIAN COMMUNICATION CRITICAL VALUE ALERT - BLOOD CULTURE IDENTIFICATION (BCID)  Results for orders placed or performed during the hospital encounter of 06/01/2016  Blood Culture ID Panel (Reflexed) (Collected: 06/17/2016  9:49 AM)  Result Value Ref Range   Enterococcus species NOT DETECTED NOT DETECTED   Listeria monocytogenes NOT DETECTED NOT DETECTED   Staphylococcus species DETECTED (A) NOT DETECTED   Staphylococcus aureus NOT DETECTED NOT DETECTED   Methicillin resistance DETECTED (A) NOT DETECTED   Streptococcus species NOT DETECTED NOT DETECTED   Streptococcus agalactiae NOT DETECTED NOT DETECTED   Streptococcus pneumoniae NOT DETECTED NOT DETECTED   Streptococcus pyogenes NOT DETECTED NOT DETECTED   Acinetobacter baumannii NOT DETECTED NOT DETECTED   Enterobacteriaceae species NOT DETECTED NOT DETECTED   Enterobacter cloacae complex NOT DETECTED NOT DETECTED   Escherichia coli NOT DETECTED NOT DETECTED   Klebsiella oxytoca NOT DETECTED NOT DETECTED   Klebsiella pneumoniae NOT DETECTED NOT DETECTED   Proteus species NOT DETECTED NOT DETECTED   Serratia marcescens NOT DETECTED NOT DETECTED   Haemophilus influenzae NOT DETECTED NOT DETECTED   Neisseria meningitidis NOT DETECTED NOT DETECTED   Pseudomonas aeruginosa NOT DETECTED NOT DETECTED   Candida albicans NOT DETECTED NOT DETECTED   Candida glabrata NOT DETECTED NOT DETECTED   Candida krusei NOT DETECTED NOT DETECTED   Candida parapsilosis NOT DETECTED NOT DETECTED   Candida tropicalis NOT DETECTED NOT DETECTED    Name of physician (or Provider) ContactedMaryln Manuel, MD  Changes to prescribed antibiotics required: No changes to current abx regimen  [place] Pink 06/18/2016  8:47 AM

## 2016-06-18 NOTE — Consult Note (Addendum)
Primary cardiologist: Consulting cardiologist: Dr Omar Dolly MD Requesting physician: Dr Omar [name] Indication: afib  Clinical Summary Omar [name] is a 71 y.o.male history of afib, asthma, HTN, morbid obesity, prior CVA  admitted 35/18 with AMS and hypoxic and hypercapneic respiratory failure requiring intubation. He developed hypotension requiring pressors after intubation. Treated for pneumonia this admission and septic shock. Hospital course complicated by AKI now on CRRT (hypotension with HD), prolonged ventilator dependence requiring trach. Patient developed retroperitoneal bleed 05/2016 while on heparin, managed conservatively. INR 2 around that time  Has had continued issues with hypotension.    Allergies  Allergen Reactions  . Anacin Af [Acetaminophen]   . Heparin     Retroperitoneal bleed in 2018, previous bleed from heparin in 2013  . Niaspan [Niacin Er]     Medications Scheduled Medications: . anticoagulant sodium citrate  10 mL Intravenous Once  . budesonide (PULMICORT) nebulizer solution  0.5 mg Nebulization BID  . cefTAZidime (FORTAZ)  IV  2 g Intravenous Q M,W,F-2000  . chlorhexidine gluconate (MEDLINE KIT)  15 mL Mouth Rinse BID  . [START ON 07-16-2016] darbepoetin (ARANESP) injection - DIALYSIS  200 mcg Intravenous Q Wed-HD  . feeding supplement (OSMOLITE 1.5 CAL)  1,000 mL Per Tube Q24H  . feeding supplement (PRO-STAT SUGAR FREE 64)  60 mL Per Tube TID  . insulin aspart  0-9 Units Subcutaneous Q4H  . ipratropium-albuterol  3 mL Nebulization BID  . levothyroxine  125 mcg Per Tube QAC breakfast  . mouth rinse  15 mL Mouth Rinse QID  . midodrine  15 mg Per Tube TID WC  . multivitamin  1 tablet Oral QHS  . pantoprazole sodium  40 mg Per Tube Daily  . [START ON 06/19/2016] vancomycin  1,000 mg Intravenous Q M,W,F-HD     Infusions: . sodium chloride 10 mL/hr at 06/03/16 2000  . sodium chloride Stopped (06/06/16 0700)     PRN Medications:  Place/Maintain  arterial line **AND** sodium chloride, sodium chloride, sodium chloride, acetaminophen (TYLENOL) oral liquid 160 mg/5 mL, acetaminophen, albuterol, alteplase, docusate, heparin, heparin, ipratropium-albuterol, lidocaine (PF), lidocaine-prilocaine, metoprolol, morphine injection, naLOXone (NARCAN)  injection, pentafluoroprop-tetrafluoroeth, RESOURCE THICKENUP CLEAR, sodium chloride flush   Past Medical History:  Diagnosis Date  . Asthma   . Atrial fibrillation ([place])   . Gout   . Hypertension   . Morbid obesity ([place])   . Multinodular goiter   . Osteoarthrosis and allied disorders   . Other and unspecified hyperlipidemia   . Stroke [facility])    right sided hemiparesis  . Thrombocytopenia ([place])   . Vitamin D deficiency     Past Surgical History:  Procedure Laterality Date  . IR FLUORO GUIDE CV LINE LEFT  06/12/2016  . IR US GUIDE VASC ACCESS LEFT  06/12/2016  . THYROID SURGERY    . TRACHEOSTOMY TUBE PLACEMENT N/A 05/21/2016   Procedure: TRACHEOSTOMY;  Surgeon: Omar Quitter, MD;  Location: [facility] OR;  Service: ENT;  Laterality: N/A;    Family History  Problem Relation Age of Onset  . Coronary artery disease Brother     Social History [name] reports that he has quit smoking. He has never used smokeless tobacco. [name] reports that he does not drink alcohol.  Review of Systems Unable to collect ROS, patient does not answer questions.      Physical Examination Blood pressure (!) 96/43, pulse (!) 123, temperature (!) 100.7 F (38.2 C), temperature source Axillary, resp. rate (!) 26, height '5\' 6"'  (1.676 m),  weight (!) 340 lb (154.2 kg), SpO2 97 %.  Intake/Output Summary (Last 24 hours) at 06/18/16 0943 Last data filed at 06/17/16 2300  Gross per 24 hour  Intake              860 ml  Output                0 ml  Net              860 ml    HEENT: sclera clear, throat clear  Cardiovascular: irreg, no m/r/g, no jvd  Respiratory: coarse bilaterally  GI: abdomen soft, NT, ND  MSK:  no LE edema  Neuro: moves all extremities  Psych: cannot assess, nonverbal   Lab Results  Basic Metabolic Panel:  Recent Labs Lab 06/15/16 0255 06/16/16 0311 06/17/16 0500 06/17/16 2304 06/18/16 0839  NA 132* 130* 132* 130* 130*  K 4.2 4.8 3.8 4.0 4.0  CL 100* 98* 98* 96* 95*  CO2 '24 25 30 28 28  ' GLUCOSE 106* 102* 115* 110* 101*  BUN 45* 65* 41* 57* 77*  CREATININE 3.56* 4.83* 3.61* 4.56* 5.00*  CALCIUM 7.7* 7.9* 7.9* 8.1* 8.2*  PHOS 5.3* 5.9* 6.8* 5.6* 7.3*    Liver Function Tests:  Recent Labs Lab 06/15/16 0255 06/16/16 0311 06/17/16 0500 06/17/16 2304 06/18/16 0839  ALBUMIN 1.7* 1.8* 1.7* 1.8* 1.9*    CBC:  Recent Labs Lab 06/14/16 0535 06/15/16 0255 06/16/16 0311 06/17/16 0500 06/17/16 2304  WBC 4.4 4.5 5.3 4.5 3.9*  NEUTROABS  --   --   --   --  0.2*  HGB 8.0* 7.9* 8.2* 7.3* 7.7*  HCT 25.0* 24.3* 26.0* 24.2* 24.2*  MCV 111.1* 111.0* 111.1* 112.6* 111.0*  PLT 130* 115* 160 200 225    Cardiac Enzymes: No results for input(s): CKTOTAL, CKMB, CKMBINDEX, TROPONINI in the last 168 hours.  BNP: Invalid input(s): POCBNP   ECG   Imaging 05/2016 echo Study Conclusions  - Procedure narrative: Transthoracic echocardiography. Image   quality was suboptimal. The study was technically difficult, as a   result of body habitus. Intravenous contrast (Definity) was   administered. - Left ventricle: The cavity size was normal. Wall thickness was   increased in a pattern of moderate LVH. Systolic function was   vigorous. The estimated ejection fraction was in the range of 65%   to 70%. Wall motion was normal; there were no regional wall   motion abnormalities. The study is not technically sufficient to   allow evaluation of LV diastolic function. - Left atrium: The atrium was normal in size. - Tricuspid valve: There was moderate regurgitation. - Pulmonary arteries: PA peak pressure: 39 mm Hg (S) + RAP. - Systemic veins: Not  visualized.  Impressions:  - Technically difficult study. Definity contrast given. LVEF   65-70%, moderate LVH, normal wall motion, normal LA size,   moderate TR, RVSP 39 mmHg + RAP.  Impression/Recommendations  1. Afib Elevated rates, partly physiologic in setting of ongoing infection, anemia, hypoxia, systemic stress. Limited on medical therapy due to ongoing hypotension. If shock liver resolved may consider amiodarone. With his renal dysfunction digoxin has increased risk. AV nodal agents limited due to soft bp's. Renal and liver dysfunction limits antiarrhythmic options.  - off anticoag due to recent severe retroperitoneal bleed and shock while on heparin gtt. Had been on home coumadin.  Remains anemic Hgb 7.7 - would not restart anticoag at this time  Will f/u hepatic panel that is pending. Rates  120s for him would be accetable due to ongoing systemic illness and severe limitations in management.    Omar [name], M.D.

## 2016-06-18 NOTE — Progress Notes (Signed)
PROGRESS NOTE  Omar [name]  TKZ:[id number] DOB: June 25, 1945 DOA: 05/07/2016 PCP: Redge Gainer, MD  Brief Narrative:   71 y/o male from [place] admitted 3/5 with altered mental status + H.flu PNA with aspiration, sepsis, hypoxic/hypercapnic respiratory failure. Course c/b retroperitoneal bleed while on heparin with subsequent shock, acute renal failure & prolonged ventilation requiring tracheostomy.  On CRRT intermittently through hosp and has not tolerated HD due to hypotension.  He has had a tunneled HD catheter placed.  He continues to have significant hypotension preventing placement of permanent feeding tube.  Continuing to have SLP reevaluate.   Started on dys I diet per SLP 4/10.  Palliative care is following.  Needs surgical G-tube placement once blood pressures have improved enough to tolerate anesthesia.    Antibiotics: Zosyn 3/06 >> 3/08 Rocephin (H.Flu PNA) 3/08 >> 3/14 Vancomycin 3/23 >> 3/28 Cefepime 3/23 >> 4/1 Meropenem 4/1 >> Vancomycin 4/3 >> 4/4  Zovirax 4/11  Cultures: Urine 3/05 >> negative Blood 3/06 >> negative Resp 3/6 >> H flu (b-lactamase +) Trach stoma site 3/23 >> diphtheroids Resp cx 3/28 >>nl flora Blood 4/1 NGTD Urine 4/10: yeast  Lines/tubes: ETT 3/05 >> 3/8 ETT 3/10 >> 3/21 Trach (Dr Redmond Baseman) 3/21 >>  Lt IJ CVL 3/07 >> 3/27 R HD catheter  >> 3/22 R IJ HD catheter 3/23 >>   Events: 3/05  Transfer to [facility] Sputum 3/06 >> Haemophilus influenzae (beta lactamase positive) Echo 3/06 >> EF 65 to 70%, mod LVH, mod TR, PAS 39 mmHg CT head 3/7 >> no acute abnormality  3/10 - Large retroperitoneal bleed noted on CT after c/o back pain/leg pain 3/27 rt humerus fracture identified - ortho consult 3/27 RUQ [place] .Marland Kitchen Not helpful due to body habitus. On ATC with copious secrtions.  3/29 - Chronic  critically ill 3/27 - end CRRT 3/30 - doing ATC x 24-48h per RN. Rt shoulder pain only when touched per RN. Making urine. Off CRRT. Not on pressors. Family not fully on  board about LTAC per RN but looking at [place]. Needs LTAC.  4/2 - resumed CRRT 4/3 - Family agreeable to PEG tube 4/5 - G-tube to be placed by gen surg however anesthesia refused 2/2 hypotension.  4/6 - restarted HD. Tolerated.  4/7 - transferred to SDU 4/8 - tunneled HD catheter by IR 4/9 - recurrent sepsis/fevers 4/10 - shingles outbreak 4/14 - CT chest/abd/pelvis ID consult 4/15 - Cardiology consult  Assessment & Plan:    Active Problems:   Paroxysmal atrial fibrillation (HCC)   Hemiparesis affecting right side as late effect of cerebrovascular accident [facility])   Hypothyroidism   Acute respiratory failure with hypoxemia (HCC)   Shock circulatory (HCC)   Altered mental status   Retroperitoneal hemorrhage   Goals of care, counseling/discussion   On enteral nutrition   Acute renal failure (ARF) (HCC)   Electrolyte imbalance   Shock liver   Current use of proton pump inhibitor   Contraindication to deep vein thrombosis (DVT) prophylaxis   Hepatic lesion   Palliative care encounter   Acute respiratory failure with hypercapnia (HCC)   AKI (acute kidney injury) ([place])   Stage 5 chronic kidney disease on chronic dialysis ([place])   HCAP (healthcare-associated pneumonia)   Palliative care by specialist   Tracheostomy care ([place])   Pressure injury of skin   Abdominal pain   FUO (fever of unknown origin)   Proximal humerus fracture  Acute on chronic hypoxemic respiratory failure, improved.   Pneumonia, H influenza (  beta lactamase positive), treated New HCAP on 4/1, treated Pulm edema, improved Presumed OSA/OHS History of asthma Cont ATC, keep o2 sats > 88% Off vent since 4/2 duoneb qid + pulmicort BID Completed a course of meropenem on 4/8 for HCAP Follow up with ENT and trach clinic outpatient, continue regular trach care  Septic and hemorrhagic shock, resolved. Off pressors 3/31 but had been intermittently hypotensive, slowly improving Paroxysmal atrial fibrillation History  of hypertension Off stress dose steroids, continue to hold anti-hypertensives. Continue midodrine to 65m TID (started on 4/5 by nephrology) Blood cultures 4/10: NGTD Repeat CXR 4/14: pending Tracheal aspirate culture pending Varicella zoster PCR positive - on zovirax per pharmacy dosing Airborne precautions  Vesicular/bullous rash on right chest, consider metal allergy from the sling he has been wearing.  DDx also include shingles.  T4 dermatome and has a large ulcerated area on his back in same dermatomal distribution -  Varicella PCR positive -  Cover metal ring with tape -  Airborne and contact precautions - started acyclovir 4/11   Fever - unknown source - asked for ID consult for assistance. CT chest abd pelvis obtained 4/14:  left-sided airspace opacities are suspicious for infection or aspiration, no explanation for fever seen in Abd/pelvis.    Mental status change - Pt had received morphine overnight due to pain with dressing changes, but concerned about CVA, will get CT head to evaluate for CVA.    New ESRD, Acute renal failure, presumed due to ATN in the setting of hemorrhagic shock. Pt intermittently on CRRT and  Currently on HD since 4/6 HD MWF Midodrine increasedwith better blood pressures Tunneled HD catheter placed on 4/9 by IR  Large retroperitoneal bleed noted 3/10, some slight increased 3/18 CT scan. Hb has been stable.   Shock liver improving.  Hepatic lesion; noted on CT scan of the abdomen 3/18. 5 cm lesion of the dome of the liver, could represent blood or infection or infarct.  Protein calorie malnutrition Continue PPI Continue tube feeds for now PEG/G tube unable to be placed.  IR unable to place 2/2 complicated anatomy. Gen surg unable to place 2/2 hypotension. [facility] may be able to take patient and make arrangements for his PEG tube  Acute blood loss anemia superimposed on anemia of chronic kidney disease, improved Severe heparin sensitivity,  associated with hemorrhage Thrombocytopenia, acute on chronic Thrombocytopenia improving Follow CBC, Goal hemoglobin greater than 7.0.  No recent bleeding or transfusion.  No heparin for now.  Hold anticoagulation due to retroperitoneal bleed SCD for DVT prophylaxis  H. influenzae pneumonia and HCAP treatment completed [facility], [facility]23[facility].  Completed meropenem on 4/8.  Cultures (-) so far.   Hyperglycemia, on steroids, resolved Hypothyroidism, stable, continue synthroid  Metabolic uremic encephalopathy, improved.  History of CVA with right hemiplegia Rt humerus fracture  Mentation improving PT consult - LTAC recommended.  RUE sling Morphine prn pain  FAMILY DISCUSSIONS:   Family OK with PEG tube as of 4/3 but unfortunately pt could not tolerate anesthesia.  Dr. DCorrie Dandyspoke with granddaughter and updated her of pts condition that he was transferred to SDU.   [facility] if [facility].  I called them 4/14 for update and they said they would check on it and call me back but they never called me back.  Plan for LTAC, [facility] expressed that they might be able to take him.  They have [place] kidney coverage over at [place].       DVT prophylaxis:  SCDs Code Status:  full Family Communication:   Disposition Plan:  LTAC  Consultants:   General surgery  Nephrology  ENT  Orthopedic surgery, Dr. Jacqulynn Cadet  Palliative care  Interventional radiology  Infectious Disease   Antimicrobials:  Anti-infectives    Start     Dose/Rate Route Frequency Ordered Stop   06/19/16 1200  vancomycin (VANCOCIN) IVPB 1000 mg/200 mL premix     1,000 mg 200 mL/hr over 60 Minutes Intravenous Every M-W-F (Hemodialysis) 06/16/16 1255     06/16/16 2200  acyclovir (ZOVIRAX) tablet 600 mg  Status:  Discontinued     600 mg Oral Once per day on Mon Wed Fri 06/15/16 1126 06/15/16 1145    06/16/16 2200  acyclovir (ZOVIRAX) 200 MG capsule 600 mg  Status:  Discontinued     600 mg Oral Once per day on Mon Wed Fri 06/15/16 1443 06/17/16 1326   06/16/16 2000  ceftAZIDime (FORTAZ) IVPB 2 g  Status:  Discontinued     2 g 100 mL/hr over 30 Minutes Intravenous Every M-W-F (2000) 06/15/16 1438 06/16/16 1652   06/16/16 2000  cefTAZidime (FORTAZ) 2 g in dextrose 5 % 50 mL IVPB     2 g 100 mL/hr over 30 Minutes Intravenous Every M-W-F (2000) 06/16/16 1652     06/16/16 1200  vancomycin (VANCOCIN) IVPB 750 mg/150 ml premix     750 mg 150 mL/hr over 60 Minutes Intravenous Every Fri (Hemodialysis) 06/16/16 1157 06/16/16 1421   06/16/16 1000  acyclovir (ZOVIRAX) 200 MG capsule 600 mg  Status:  Discontinued     600 mg Oral Once per day on Mon Wed Fri 06/15/16 1145 06/15/16 1443   06/16/16 1000  acyclovir (ZOVIRAX) tablet 200 mg  Status:  Discontinued     200 mg Oral Daily 06/15/16 1435 06/16/16 0415   06/16/16 1000  acyclovir (ZOVIRAX) 200 MG capsule 200 mg  Status:  Discontinued     200 mg Oral Daily 06/16/16 0416 06/17/16 1326   06/15/16 2200  acyclovir (ZOVIRAX) 200 MG capsule 200 mg  Status:  Discontinued     200 mg Oral 2 times daily 06/15/16 0801 06/15/16 1126   06/15/16 2200  acyclovir (ZOVIRAX) tablet 200 mg  Status:  Discontinued     200 mg Oral Once per day on Sun Tue Thu Sat 06/15/16 1126 06/15/16 1144   06/15/16 2200  acyclovir (ZOVIRAX) 200 MG capsule 200 mg  Status:  Discontinued     200 mg Oral Once per day on Sun Tue Thu Sat 06/15/16 1144 06/17/16 1326   06/15/16 1200  acyclovir (ZOVIRAX) 200 MG capsule 200 mg  Status:  Discontinued     200 mg Oral Daily 06/15/16 1141 06/15/16 1435   06/15/16 1130  acyclovir (ZOVIRAX) tablet 400 mg     400 mg Oral  Once 06/15/16 1117 06/15/16 1446   06/15/16 1130  Ceftazidime (FORTAZ) injection 2 g  Status:  Discontinued     2 g Intravenous  Once 06/15/16 1117 06/15/16 1118   06/15/16 1130  cefTAZidime (FORTAZ) 2 g in dextrose 5 % 50 mL IVPB      2 g 100 mL/hr over 30 Minutes Intravenous  Once 06/15/16 1122 06/15/16 1240   06/15/16 1130  acyclovir (ZOVIRAX) tablet 200 mg  Status:  Discontinued     200 mg Oral  Every morning - 10a 06/15/16 1126 06/15/16 1141  06/15/16 0900  cefTAZidime (FORTAZ) 2 g in dextrose 5 % 50 mL IVPB  Status:  Discontinued     2 g 100 mL/hr over 30 Minutes Intravenous  Once 06/15/16 0720 06/15/16 1117   06/15/16 0900  acyclovir (ZOVIRAX) 200 MG capsule 400 mg  Status:  Discontinued     400 mg Oral  Once 06/15/16 0801 06/15/16 1117   06/15/16 0000  acyclovir (ZOVIRAX) 200 MG capsule 200 mg  Status:  Discontinued     200 mg Oral 2 times daily 06/14/16 2353 06/15/16 0801   06/14/16 2200  acyclovir (ZOVIRAX) tablet 200 mg  Status:  Discontinued     200 mg Oral 2 times daily 06/14/16 1533 06/14/16 2353   06/14/16 1800  cefTAZidime (FORTAZ) 2 g in dextrose 5 % 50 mL IVPB  Status:  Discontinued     2 g 100 mL/hr over 30 Minutes Intravenous Every M-W-F (Hemodialysis) 06/13/16 1338 06/15/16 1438   06/14/16 1600  acyclovir (ZOVIRAX) tablet 400 mg  Status:  Discontinued     400 mg Oral  Once 06/14/16 1533 06/15/16 0801   06/14/16 1200  vancomycin (VANCOCIN) IVPB 1000 mg/200 mL premix  Status:  Discontinued     1,000 mg 200 mL/hr over 60 Minutes Intravenous Every M-W-F (Hemodialysis) 06/13/16 1338 06/16/16 1157   06/13/16 1300  vancomycin (VANCOCIN) 2,000 mg in sodium chloride 0.9 % 500 mL IVPB     2,000 mg 250 mL/hr over 120 Minutes Intravenous  Once 06/13/16 1244 06/13/16 1645   06/13/16 1300  cefTAZidime (FORTAZ) 2 g in dextrose 5 % 50 mL IVPB     2 g 100 mL/hr over 30 Minutes Intravenous  Once 06/13/16 1244 06/13/16 1506   06/12/16 1448  ceFAZolin (ANCEF) 2-4 GM/100ML-% IVPB  Status:  Discontinued    Comments:  Shaaron Adler   : cabinet override      06/12/16 1448 06/12/16 1454   06/12/16 1432  ceFAZolin (ANCEF) 2-4 GM/100ML-% IVPB    Comments:  Laughlin, Amy   : cabinet override      06/12/16 1432  06/12/16 1436   06/27/2016 0900  meropenem (MERREM) 500 mg in sodium chloride 0.9 % 50 mL IVPB     500 mg 100 mL/hr over 30 Minutes Intravenous Every 24 hours 06/07/16 0955 06/11/16 1109   06/12/2016 0600  cefoTEtan (CEFOTAN) 2 g in dextrose 5 % 50 mL IVPB  Status:  Discontinued     2 g 100 mL/hr over 30 Minutes Intravenous To [facility] 06/07/16 1354 06/07/2016 1157   06/06/16 1200  vancomycin (VANCOCIN) 1,500 mg in sodium chloride 0.9 % 500 mL IVPB  Status:  Discontinued     1,500 mg 250 mL/hr over 120 Minutes Intravenous Every 24 hours 06/06/16 0818 06/07/16 0954   06/05/16 1000  meropenem (MERREM) 1 g in sodium chloride 0.9 % 100 mL IVPB  Status:  Discontinued     1 g 200 mL/hr over 30 Minutes Intravenous Every 12 hours 06/05/16 0926 06/07/16 0955   06/04/16 1400  vancomycin (VANCOCIN) 2,000 mg in sodium chloride 0.9 % 500 mL IVPB     2,000 mg 250 mL/hr over 120 Minutes Intravenous  Once 06/04/16 1234 06/04/16 1737   06/04/16 1400  meropenem (MERREM) 500 mg in sodium chloride 0.9 % 50 mL IVPB  Status:  Discontinued     500 mg 100 mL/hr over 30 Minutes Intravenous Every 24 hours 06/04/16 1234 06/05/16 0926   05/30/16 2300  ceFEPIme (MAXIPIME) 1 g in  dextrose 5 % 50 mL IVPB  Status:  Discontinued     1 g 100 mL/hr over 30 Minutes Intravenous Every 24 hours 05/30/16 1223 06/04/16 1231   05/27/16 1400  ceFEPIme (MAXIPIME) 2 g in dextrose 5 % 50 mL IVPB  Status:  Discontinued     2 g 100 mL/hr over 30 Minutes Intravenous Every 12 hours 05/27/16 1014 05/30/16 1223   05/27/16 1400  vancomycin (VANCOCIN) 1,500 mg in sodium chloride 0.9 % 500 mL IVPB  Status:  Discontinued     1,500 mg 250 mL/hr over 120 Minutes Intravenous Every 24 hours 05/27/16 1014 05/30/16 1224   05/26/16 1300  ceFEPIme (MAXIPIME) 1 g in dextrose 5 % 50 mL IVPB  Status:  Discontinued     1 g 100 mL/hr over 30 Minutes Intravenous Every 24 hours 05/26/16 1225 05/27/16 1014   05/26/16 1230  vancomycin (VANCOCIN) 2,500  mg in sodium chloride 0.9 % 500 mL IVPB     2,500 mg 250 mL/hr over 120 Minutes Intravenous  Once 05/26/16 1225 05/26/16 1530   05/16/16 1400  cefTRIAXone (ROCEPHIN) 2 g in dextrose 5 % 50 mL IVPB     2 g 100 mL/hr over 30 Minutes Intravenous Every 24 hours 05/15/16 1649 05/17/16 1458   05/11/16 1400  cefTRIAXone (ROCEPHIN) 1 g in dextrose 5 % 50 mL IVPB  Status:  Discontinued     1 g 100 mL/hr over 30 Minutes Intravenous Every 24 hours 05/11/16 0833 05/15/16 1649   05/09/16 1400  piperacillin-tazobactam (ZOSYN) IVPB 3.375 g  Status:  Discontinued     3.375 g 12.5 mL/hr over 240 Minutes Intravenous Every 8 hours 05/09/16 1114 05/11/16 0833     Subjective:  Patient much less responsive today.  He had received morphine last night during a dressing change.   Review of systems: Pt unable to answer because of current condition   Objective: Vitals:   06/18/16 0300 06/18/16 0414 06/18/16 0430 06/18/16 0445  BP: 115/63 114/64 (!) 130/112 110/61  Pulse: (!) 116 (!) 119 (!) 130 (!) 101  Resp: (!) 21 19 (!) 22 18  Temp:  (!) 100.7 F (38.2 C)    TempSrc:  Axillary    SpO2: 95% 97% 99% 94%  Height:        Intake/Output Summary (Last 24 hours) at 06/18/16 0802 Last data filed at 06/17/16 2300  Gross per 24 hour  Intake              860 ml  Output                0 ml  Net              860 ml   Filed Weights    Examination:  General exam:  Morbidly obese male, trach collar in place with rhonchorous breath sounds, lethargic, arousable HEENT:  MMM, trach collar in place Respiratory system:  no focal rales or wheezes Cardiovascular system: tachycardic, normal S1/S2. No murmurs, rubs, gallops or clicks.  Warm extremities Gastrointestinal system: Hyperactive bowel sounds, soft, nondistended, nontender. MSK:  Decreased bulk and strength.  Right arm in sling.  2+ pitting bilateral lower extremity edema.  Neuro:  3 out of 5 strength of the right upper and right lower extremities, 4-5  strength left upper and lower extremities Skin:  Herpetic rash on right chest wall and back improving.  Has a large ulcerated area on his back in same distribution.  No vesicles under right arm  seem.  Base of lesions is not particularly erythematous.   GU:  Urine in foley is dark brown.   Data Reviewed: I have personally reviewed following labs and imaging studies  CBC:  Recent Labs Lab 06/14/16 0535 06/15/16 0255 06/16/16 0311 06/17/16 0500 06/17/16 2304  WBC 4.4 4.5 5.3 4.5 3.9*  NEUTROABS  --   --   --   --  0.2*  HGB 8.0* 7.9* 8.2* 7.3* 7.7*  HCT 25.0* 24.3* 26.0* 24.2* 24.2*  MCV 111.1* 111.0* 111.1* 112.6* 111.0*  PLT 130* 115* 160 200 616   Basic Metabolic Panel:  Recent Labs Lab 06/14/16 0535 06/15/16 0255 06/16/16 0311 06/17/16 0500 06/17/16 2304  NA 133* 132* 130* 132* 130*  K 4.0 4.2 4.8 3.8 4.0  CL 98* 100* 98* 98* 96*  CO2 _0 GLUCOSE 105* 106* 102* 115* 110*  BUN 70* 45* 65* 41* 57*  CREATININE 4.40* 3.56* 4.83* 3.61* 4.56*  CALCIUM 7.9* 7.7* 7.9* 7.9* 8.1*  PHOS 6.2* 5.3* 5.9* 6.8* 5.6*   GFR: Estimated Creatinine Clearance: 20.7 mL/min (A) (by C-G formula based on SCr of 4.56 mg/dL (H)). Liver Function Tests:  Recent Labs Lab 06/14/16 0535 06/15/16 0255 06/16/16 0311 06/17/16 0500 06/17/16 2304  ALBUMIN 1.7* 1.7* 1.8* 1.7* 1.8*   No results for input(s): LIPASE, AMYLASE in the last 168 hours. No results for input(s): AMMONIA in the last 168 hours. Coagulation Profile:  Recent Labs Lab 06/12/16 0417  INR 1.27   Cardiac Enzymes: No results for input(s): CKTOTAL, CKMB, CKMBINDEX, TROPONINI in the last 168 hours. BNP (last 3 results) No results for input(s): PROBNP in the last 8760 hours. HbA1C: No results for input(s): HGBA1C in the last 72 hours. CBG:  Recent Labs Lab 06/17/16 1725 06/17/16 2007 06/18/16 0009 06/18/16 0421 06/18/16 0758  GLUCAP 96 106* 111* 98 93   Lipid Profile: No results for input(s): CHOL,  HDL, LDLCALC, TRIG, CHOLHDL, LDLDIRECT in the last 72 hours. Thyroid Function Tests: No results for input(s): TSH, T4TOTAL, FREET4, T3FREE, THYROIDAB in the last 72 hours. Anemia Panel: No results for input(s): VITAMINB12, FOLATE, FERRITIN, TIBC, IRON, RETICCTPCT in the last 72 hours. Urine analysis:    Component Value Date/Time   COLORURINE AMBER (A) 06/13/2016 0808   APPEARANCEUR CLOUDY (A) 06/13/2016 0808   LABSPEC 1.023 06/13/2016 0808   PHURINE 6.0 06/13/2016 0808   GLUCOSEU NEGATIVE 06/13/2016 0808   HGBUR LARGE (A) 06/13/2016 0808   BILIRUBINUR NEGATIVE 06/13/2016 0808   BILIRUBINUR small 10/06/2014 1612   KETONESUR NEGATIVE 06/13/2016 0808   PROTEINUR 100 (A) 06/13/2016 0808   UROBILINOGEN 2.0 10/06/2014 1612   UROBILINOGEN 4.0 (H) 05/30/2010 1511   NITRITE NEGATIVE 06/13/2016 0808   LEUKOCYTESUR MODERATE (A) 06/13/2016 0808    Recent Results (from the past 240 hour(s))  MRSA PCR Screening     Status: Abnormal   Collection Time: 06/11/16  4:18 AM  Result Value Ref Range Status   MRSA by PCR INVALID RESULTS, SPECIMEN SENT FOR CULTURE (A) NEGATIVE Final    Comment: NOTIFIED RN JENNIFER AT 0737 06/11/16 BY A.DAVIS  MRSA culture     Status: None   Collection Time: 06/11/16  4:18 AM  Result Value Ref Range Status   Specimen Description NASAL SWAB  Final   Special Requests NONE  Final   Culture NO MRSA DETECTED  Final   Report Status 06/14/2016 FINAL  Final  Culture, Urine     Status: Abnormal   Collection Time:  06/13/16  8:08 AM  Result Value Ref Range Status   Specimen Description URINE, RANDOM  Final   Special Requests Normal  Final   Culture >=100,000 COLONIES/mL YEAST (A)  Final   Report Status 06/14/2016 FINAL  Final  Culture, respiratory (NON-Expectorated)     Status: None   Collection Time: 06/13/16  9:04 AM  Result Value Ref Range Status   Specimen Description TRACHEAL ASPIRATE  Final   Special Requests Normal  Final   Gram Stain   Final    MODERATE WBC  PRESENT,BOTH PMN AND MONONUCLEAR FEW GRAM POSITIVE COCCI IN PAIRS IN CLUSTERS RARE GRAM NEGATIVE RODS    Culture Consistent with normal respiratory flora.  Final   Report Status 06/15/2016 FINAL  Final  Culture, blood (Routine X 2) w Reflex to ID Panel     Status: None (Preliminary result)   Collection Time: 06/13/16  9:44 AM  Result Value Ref Range Status   Specimen Description BLOOD LEFT ARM  Final   Special Requests IN PEDIATRIC BOTTLE Blood Culture adequate volume  Final   Culture NO GROWTH 4 DAYS  Final   Report Status PENDING  Incomplete  Culture, blood (Routine X 2) w Reflex to ID Panel     Status: None (Preliminary result)   Collection Time: 06/13/16 10:49 AM  Result Value Ref Range Status   Specimen Description BLOOD LEFT HAND  Final   Special Requests IN PEDIATRIC BOTTLE BCAV  Final   Culture NO GROWTH 4 DAYS  Final   Report Status PENDING  Incomplete  Culture, blood (Routine X 2) w Reflex to ID Panel     Status: None (Preliminary result)   Collection Time: 06/17/16  9:49 AM  Result Value Ref Range Status   Specimen Description BLOOD RIGHT HAND  Final   Special Requests IN PEDIATRIC BOTTLE Blood Culture adequate volume  Final   Culture  Setup Time   Final    GRAM POSITIVE COCCI IN CLUSTERS IN PEDIATRIC BOTTLE Organism ID to follow    Culture PENDING  Incomplete   Report Status PENDING  Incomplete    Radiology Studies: Ct Abdomen Pelvis Wo Contrast  Result Date: 06/17/2016 CLINICAL DATA:  Fever of unknown origin. Morbid obesity. Thrombocytopenia. EXAM: CT CHEST, ABDOMEN AND PELVIS WITHOUT CONTRAST TECHNIQUE: Multidetector CT imaging of the chest, abdomen and pelvis was performed following the standard protocol without IV contrast. COMPARISON:  Abdominopelvic CT of 06/06/2016. Chest radiograph 06/17/2016. No comparison chest CT. FINDINGS: Multifactorial degradation, including patient morbid obesity, arms not raised above the head, lack of IV contrast, and mild motion. CT  CHEST FINDINGS Cardiovascular: Left-sided central line which terminates at the high right atrium. Mild cardiomegaly. Right-sided central line terminates at the high SVC. Tortuous thoracic aorta. Pulmonary artery enlargement, 4.0 cm outflow tract. Mediastinum/Nodes: Borderline right paratracheal adenopathy at 10 mm. A node within the azygoesophageal recess measures 9 mm. Hilar regions poorly evaluated without intravenous contrast. Lungs/Pleura: Small right pleural effusion is grossly similar. Tracheostomy. Right lower lobe airspace disease. More patchy posterior left upper and lower lobe airspace opacities. Musculoskeletal: No acute osseous abnormality. CT ABDOMEN PELVIS FINDINGS Hepatobiliary: Markedly limited evaluation of the liver. Lesion described in the central right lobe on 05/20/2016 is not readily apparent. Multiple small gallstones without surrounding inflammation or biliary duct dilatation. Pancreas: Normal, without mass or ductal dilatation. Spleen: Normal in size, without focal abnormality. Adrenals/Urinary Tract: Normal adrenal glands. No renal calculi or hydronephrosis. 12 mm left renal interpolar exophytic lesion measures greater than  fluid density on image 66/ series 3. This is similar in size to on the prior exam. Interpolar right renal lesion measures 2.4 cm and borderline greater than fluid density. Normal urinary bladder. Stomach/Bowel: A nasogastric tube is looped in the stomach, terminating in the gastric body. Catheter in the rectum. Scattered colonic diverticula. Normal small bowel caliber. Vascular/Lymphatic: Normal aortic caliber. 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 The left common iliac is ectatic at 1.9 cm. No gross abdominopelvic adenopathy. Reproductive: Normal prostate. Other: Small bilateral fat containing inguinal hernias. Right sided retroperitoneal complex fluid collection is again identified. This is slightly decreased compared to 05/20/2016. Example 16.7 cm today  versus 18.4 cm at the same level. This extends into the pelvis as before. Trace left pericolic gutter fluid is similar.  Anasarca. Musculoskeletal: Bilateral hip osteoarthritis. IMPRESSION: 1. Moderate-to-marked degradation, as detailed above. 2. Similar right pleural effusion with right lower lobe collapse/consolidation which could represent infection or atelectasis. More patchy left-sided airspace opacities are suspicious for infection or aspiration. 3. No explanation for fever within the abdomen or pelvis. 4. Slight decrease in size of a large right-sided retroperitoneal hematoma. 5. Cholelithiasis. 6. Pulmonary artery enlargement suggests pulmonary arterial hypertension. 7. Mild thoracic adenopathy, likely reactive. 8. Bilateral common iliac artery dilatation. 9. Bilateral indeterminate renal lesions. These could be re-evaluated with follow-up pre and post contrast CT or MRI (as an outpatient) Electronically Signed   By: Abigail Miyamoto M.D.   On: 06/17/2016 21:03   Ct Chest Wo Contrast  Result Date: 06/17/2016 CLINICAL DATA:  Fever of unknown origin. Morbid obesity. Thrombocytopenia. EXAM: CT CHEST, ABDOMEN AND PELVIS WITHOUT CONTRAST TECHNIQUE: Multidetector CT imaging of the chest, abdomen and pelvis was performed following the standard protocol without IV contrast. COMPARISON:  Abdominopelvic CT of 06/06/2016. Chest radiograph 06/17/2016. No comparison chest CT. FINDINGS: Multifactorial degradation, including patient morbid obesity, arms not raised above the head, lack of IV contrast, and mild motion. CT CHEST FINDINGS Cardiovascular: Left-sided central line which terminates at the high right atrium. Mild cardiomegaly. Right-sided central line terminates at the high SVC. Tortuous thoracic aorta. Pulmonary artery enlargement, 4.0 cm outflow tract. Mediastinum/Nodes: Borderline right paratracheal adenopathy at 10 mm. A node within the azygoesophageal recess measures 9 mm. Hilar regions poorly evaluated  without intravenous contrast. Lungs/Pleura: Small right pleural effusion is grossly similar. Tracheostomy. Right lower lobe airspace disease. More patchy posterior left upper and lower lobe airspace opacities. Musculoskeletal: No acute osseous abnormality. CT ABDOMEN PELVIS FINDINGS Hepatobiliary: Markedly limited evaluation of the liver. Lesion described in the central right lobe on 05/20/2016 is not readily apparent. Multiple small gallstones without surrounding inflammation or biliary duct dilatation. Pancreas: Normal, without mass or ductal dilatation. Spleen: Normal in size, without focal abnormality. Adrenals/Urinary Tract: Normal adrenal glands. No renal calculi or hydronephrosis. 12 mm left renal interpolar exophytic lesion measures greater than fluid density on image 66/ series 3. This is similar in size to on the prior exam. Interpolar right renal lesion measures 2.4 cm and borderline greater than fluid density. Normal urinary bladder. Stomach/Bowel: A nasogastric tube is looped in the stomach, terminating in the gastric body. Catheter in the rectum. Scattered colonic diverticula. Normal small bowel caliber. Vascular/Lymphatic: Normal aortic caliber. 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 The left common iliac is ectatic at 1.9 cm. No gross abdominopelvic adenopathy. Reproductive: Normal prostate. Other: Small bilateral fat containing inguinal hernias. Right sided retroperitoneal complex fluid collection is again identified. This is slightly decreased compared to 05/20/2016.  Example 16.7 cm today versus 18.4 cm at the same level. This extends into the pelvis as before. Trace left pericolic gutter fluid is similar.  Anasarca. Musculoskeletal: Bilateral hip osteoarthritis. IMPRESSION: 1. Moderate-to-marked degradation, as detailed above. 2. Similar right pleural effusion with right lower lobe collapse/consolidation which could represent infection or atelectasis. More patchy left-sided airspace  opacities are suspicious for infection or aspiration. 3. No explanation for fever within the abdomen or pelvis. 4. Slight decrease in size of a large right-sided retroperitoneal hematoma. 5. Cholelithiasis. 6. Pulmonary artery enlargement suggests pulmonary arterial hypertension. 7. Mild thoracic adenopathy, likely reactive. 8. Bilateral common iliac artery dilatation. 9. Bilateral indeterminate renal lesions. These could be re-evaluated with follow-up pre and post contrast CT or MRI (as an outpatient) Electronically Signed   By: Abigail Miyamoto M.D.   On: 06/17/2016 21:03   Dg Chest Port 1 View  Result Date: 06/17/2016 CLINICAL DATA:  Pt has had a fever and is having tenderness throughout his abdomen per MD. Pt cannot relay hx. Pt has shingles EXAM: PORTABLE CHEST 1 VIEW COMPARISON:  06/16/2016 FINDINGS: Tracheostomy tube, feeding tube and central venous line not changed. Bilateral pleural effusions RIGHT greater than LEFT. Mild central venous congestion. No pneumothorax IMPRESSION: 1. Stable support apparatus. 2. Bilateral pleural effusions and central venous congestion increased from prior. Electronically Signed   By: Suzy Bouchard M.D.   On: 06/17/2016 10:08   Dg Shoulder Right Port  Result Date: 06/16/2016 CLINICAL DATA:  71 year old male with a history of prior right humeral fracture EXAM: PORTABLE RIGHT SHOULDER COMPARISON:  Plain film 05/30/2016 FINDINGS: Oblique fracture of the proximal right humerus again demonstrated without significant callus formation. Unchanged alignment of the fracture fragments. Degenerative changes at the glenohumeral joint and acromioclavicular joint. Right IJ central catheter in place without pneumothorax. Tracheostomy partially imaged.  Enteric tube partially imaged. IMPRESSION: Similar appearance of oblique fracture proximal right humerus, without significant callus formation in the interval and no change in the fracture alignment. Degenerative changes of the  glenohumeral joint and acromioclavicular joint. Surgical tubes and lines partially image, as above. Electronically Signed   By: Corrie Mckusick D.O.   On: 06/16/2016 13:16   Dg Abd Portable 1v  Result Date: 06/17/2016 CLINICAL DATA:  71 year old male with history of fever and abdominal tenderness this morning. EXAM: PORTABLE ABDOMEN - 1 VIEW COMPARISON:  05/25/2016. FINDINGS: Feeding tube coiled in the stomach with tip in the antral pre-pyloric region. Gas and stool are seen scattered throughout the colon extending to the level of the distal rectum. No pathologic distension of small bowel is noted. No gross evidence of pneumoperitoneum. IMPRESSION: 1. Tip of feeding tube is in the antral pre-pyloric region of the stomach. 2. Nonobstructive bowel gas pattern. 3. No pneumoperitoneum. Electronically Signed   By: Vinnie Langton M.D.   On: 06/17/2016 10:14   Scheduled Meds: . anticoagulant sodium citrate  10 mL Intravenous Once  . budesonide (PULMICORT) nebulizer solution  0.5 mg Nebulization BID  . cefTAZidime (FORTAZ)  IV  2 g Intravenous Q M,W,F-2000  . chlorhexidine gluconate (MEDLINE KIT)  15 mL Mouth Rinse BID  . [START ON Jun 29, 2016] darbepoetin (ARANESP) injection - DIALYSIS  200 mcg Intravenous Q Wed-HD  . feeding supplement (OSMOLITE 1.5 CAL)  1,000 mL Per Tube Q24H  . feeding supplement (PRO-STAT SUGAR FREE 64)  60 mL Per Tube TID  . insulin aspart  0-9 Units Subcutaneous Q4H  . ipratropium-albuterol  3 mL Nebulization BID  . levothyroxine  125  mcg Per Tube QAC breakfast  . mouth rinse  15 mL Mouth Rinse QID  . midodrine  15 mg Per Tube TID WC  . multivitamin  1 tablet Oral QHS  . pantoprazole sodium  40 mg Per Tube Daily  . [START ON 06/19/2016] vancomycin  1,000 mg Intravenous Q M,W,F-HD   Continuous Infusions: . sodium chloride 10 mL/hr at 06/03/16 2000  . sodium chloride Stopped (06/06/16 0700)     LOS: 41 days   Time spent: 28 min  Irwin Brakeman, MD [facility] Pager [phone]  If 7PM-7AM, please contact night-coverage [email or web address] Password [email or web address] 06/18/2016, 8:02 AM

## 2016-06-18 NOTE — Progress Notes (Signed)
06/18/2016 12:51 PM  I called and spoke with patient's daughter Aloha Gell and updated her on patient's condition and progress.  She still really desires transfer to [facility].  I will continue to call and request for transfer.  I didn't hear back from them yesterday.  I will try again tomorrow.  Also, I'm willing to meet with grandaughter POA when she comes for a visit.  I want to discuss and clarify goals of care with her given the new clinical situation that he is in now.     Maryln Manuel, MD

## 2016-06-18 NOTE — Progress Notes (Signed)
Per Dr. Laural Benes, Nephrology will made decision to remove R. HD catheter which is currently used for IV infusions. L. HD cath is for Dialysis MWF. Dr. Laural Benes will then decide best access to continue IV infusions. RN spoke to Dr. Lowell Guitar who will review why pt has 2 HD catheters. Dr Laural Benes stated he would update pt's daughter and granddaughter.

## 2016-06-18 NOTE — Progress Notes (Signed)
06/18/2016 8:47 AM   Update: pharmacy called, blood culture positive with staph species, unable to determine if contaminant due to pediatric bottles used.  Continue antibiotics.   Maryln Manuel, MD

## 2016-06-18 NOTE — Progress Notes (Signed)
Liver tests have normalized, amio would be a possible option for his afib. I think his rates 100-120 are reasonable given the degree of his severe systemic illness, by telemetry currnently he is low 100s. I would only start amio if sustained severe tachycardia.   Dina Rich MD

## 2016-06-18 NOTE — Progress Notes (Addendum)
Pharmacy Antibiotic Note  Omar [name] is a 71 y.o. male admitted on 05/14/16 with AMS. Now s/p multiple abx courses but has new fever and zoster rash. Pharmacy has been consulted for unasyn (concern of aspiration PNA) vancomycin and acyclovir  dosing. Acyclovir po discontinued 4/14 and to change to IV. Noted patient with new ESRD with last HD 4/13- 4.5 hours at BFR 400 -WBC= 4.3, tmax= 101.7 -weight= 154kg: will dose acyclovir using an adjusted weight of 100kg   Plan: -Acyclovir  IV ( /kg) now then  IV q24h  -Unasyn 3gm IV q24h -Vancomycin IV with HD MWF -Will follow renal function, cultures and clinical progress  Height:  (167.6 cm) Weight:  (Possible problem with bed scale?) IBW/kg (Calculated) : 63.8  Temp (24hrs), Avg:100.6 F (38.1 C), Min:99.3 F (37.4 C), Max:101.7 F (38.7 C)   Recent Labs Lab 06/15/16 0255 06/16/16 0311 06/16/16 0830 06/17/16 0500 06/17/16 2304 06/18/16 0839 06/18/16 0930  WBC 4.5 5.3  --  4.5 3.9*  --  4.3  CREATININE 3.56* 4.83*  --  3.61* 4.56* 5.00* 5.19*  VANCORANDOM  --   --  28  --   --   --   --     Estimated Creatinine Clearance: 18.2 mL/min (A) (by C-G formula based on SCr of 5.19 mg/dL (H)).    Allergies  Allergen Reactions  . Anacin Af [Acetaminophen]   . Heparin     Retroperitoneal bleed in 2018, previous bleed from heparin in 2013  . Niaspan [Niacin Er]     Antimicrobials this admission: Zosyn 3/6 >> 3/8 CTX 3/8 >> 3/14 Cefepime 3/23 >> 4/1 Vancomycin 3/23 >> 3/28, 4/1 > 4/4; 4/10 Meropenem 4/1 >> 4/8 Ceftazidime 4/10 >> 4/15 Acyclovir po 4/12 (changed to IV 4/15)>>  Dose adjustments this admission:  3/28 VR = 37   4/3 VR = 23   4/13 VR= 28  Microbiology results: 3/6 MRSA PCR >> negative 3/6 Resp >> H. Influenzae, beta lactamase positive 4/1 Blood >> ngtd **All other cultures negative, including 4/1 recheck Neg hep B 4/8 MRSA swab: pending 4/10 TA Cx: few GPC in pairs and clusters and  rare GNRs ; normal respiratory flora 4/10 BCx: NGTD 4/10 UCx: 100,000 colonies of yeast  4/14 GPC/clusters 4/14 BCID:  Shows staph species and MecA  Thank you for allowing pharmacy to be a part of this patient's care.  Harland German, Pharm D 06/18/2016 11:31 AM

## 2016-06-18 NOTE — Progress Notes (Signed)
Assessment/Plan: 1 AKI oliguric. S/P [facility] TDC 2 Resp insuff with trach  3 Massive obesity 4 CVA 5 Arm fx 6 RP bleed 8 Anemia esa,  10 afib, low BP 11 Nutrition needs PEG 12 HZV 13 G pos C in clusters, bacteremia  P OK to remove right IJ temp catheter; HD Monday via [place] on L  Subjective: Interval History: fevers  Objective: Vital signs in last 24 hours: Temp:  [99.3 F (37.4 C)-101.7 F (38.7 C)] 99.3 F (37.4 C) (04/15 0848) Pulse Rate:  [94-130] 116 (04/15 1625) Resp:  [18-26] 24 (04/15 1625) BP: (86-165)/(43-143) 102/63 (04/15 1625) SpO2:  [94 %-100 %] 99 % (04/15 1625) FiO2 (%):  [28 %] 28 % (04/15 1625) Weight change:   Intake/Output from previous day: 04/14 0701 - 04/15 0700 In: 860 [P.O.:180; NG/GT:680] Out: 800 [Stool:800] Intake/Output this shift: Total I/O In: 990 [NG/GT:880; IV Piggyback:110] Out: -   General appearance: agitated Throat: tach with colloared O2  massive obesity. Temp HD cath in right neck  Lab Results:  Recent Labs  06/17/16 2304 06/18/16 0930  WBC 3.9* 4.3  HGB 7.7* 8.4*  HCT 24.2* 27.0*  PLT 225 263   BMET:  Recent Labs  06/18/16 0839 06/18/16 0930  NA 130* 130*  K 4.0 4.1  CL 95* 96*  CO2 28 28  GLUCOSE 101* 104*  BUN 77* 79*  CREATININE 5.00* 5.19*  CALCIUM 8.2* 8.1*   No results for input(s): PTH in the last 72 hours. Iron Studies: No results for input(s): IRON, TIBC, TRANSFERRIN, FERRITIN in the last 72 hours. Studies/Results: Ct Abdomen Pelvis Wo Contrast  Result Date: 06/17/2016 CLINICAL DATA:  Fever of unknown origin. Morbid obesity. Thrombocytopenia. EXAM: CT CHEST, ABDOMEN AND PELVIS WITHOUT CONTRAST TECHNIQUE: Multidetector CT imaging of the chest, abdomen and pelvis was performed following the standard protocol without IV contrast. COMPARISON:  Abdominopelvic CT of 06/06/2016. Chest radiograph 06/17/2016. No comparison chest CT. FINDINGS: Multifactorial degradation, including patient morbid obesity,  arms not raised above the head, lack of IV contrast, and mild motion. CT CHEST FINDINGS Cardiovascular: Left-sided central line which terminates at the high right atrium. Mild cardiomegaly. Right-sided central line terminates at the high SVC. Tortuous thoracic aorta. Pulmonary artery enlargement, 4.0 cm outflow tract. Mediastinum/Nodes: Borderline right paratracheal adenopathy at 10 mm. A node within the azygoesophageal recess measures 9 mm. Hilar regions poorly evaluated without intravenous contrast. Lungs/Pleura: Small right pleural effusion is grossly similar. Tracheostomy. Right lower lobe airspace disease. More patchy posterior left upper and lower lobe airspace opacities. Musculoskeletal: No acute osseous abnormality. CT ABDOMEN PELVIS FINDINGS Hepatobiliary: Markedly limited evaluation of the liver. Lesion described in the central right lobe on 05/20/2016 is not readily apparent. Multiple small gallstones without surrounding inflammation or biliary duct dilatation. Pancreas: Normal, without mass or ductal dilatation. Spleen: Normal in size, without focal abnormality. Adrenals/Urinary Tract: Normal adrenal glands. No renal calculi or hydronephrosis. 12 mm left renal interpolar exophytic lesion measures greater than fluid density on image 66/ series 3. This is similar in size to on the prior exam. Interpolar right renal lesion measures 2.4 cm and borderline greater than fluid density. Normal urinary bladder. Stomach/Bowel: A nasogastric tube is looped in the stomach, terminating in the gastric body. Catheter in the rectum. Scattered colonic diverticula. Normal small bowel caliber. Vascular/Lymphatic: Normal aortic caliber. 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 The left common iliac is ectatic at 1.9 cm. No gross abdominopelvic adenopathy. Reproductive: Normal prostate. Other: Small bilateral fat containing  inguinal hernias. Right sided retroperitoneal complex fluid collection is again identified. This  is slightly decreased compared to 05/20/2016. Example 16.7 cm today versus 18.4 cm at the same level. This extends into the pelvis as before. Trace left pericolic gutter fluid is similar.  Anasarca. Musculoskeletal: Bilateral hip osteoarthritis. IMPRESSION: 1. Moderate-to-marked degradation, as detailed above. 2. Similar right pleural effusion with right lower lobe collapse/consolidation which could represent infection or atelectasis. More patchy left-sided airspace opacities are suspicious for infection or aspiration. 3. No explanation for fever within the abdomen or pelvis. 4. Slight decrease in size of a large right-sided retroperitoneal hematoma. 5. Cholelithiasis. 6. Pulmonary artery enlargement suggests pulmonary arterial hypertension. 7. Mild thoracic adenopathy, likely reactive. 8. Bilateral common iliac artery dilatation. 9. Bilateral indeterminate renal lesions. These could be re-evaluated with follow-up pre and post contrast CT or MRI (as an outpatient) Electronically Signed   By: Abigail Miyamoto M.D.   On: 06/17/2016 21:03   Ct Chest Wo Contrast  Result Date: 06/17/2016 CLINICAL DATA:  Fever of unknown origin. Morbid obesity. Thrombocytopenia. EXAM: CT CHEST, ABDOMEN AND PELVIS WITHOUT CONTRAST TECHNIQUE: Multidetector CT imaging of the chest, abdomen and pelvis was performed following the standard protocol without IV contrast. COMPARISON:  Abdominopelvic CT of 06/06/2016. Chest radiograph 06/17/2016. No comparison chest CT. FINDINGS: Multifactorial degradation, including patient morbid obesity, arms not raised above the head, lack of IV contrast, and mild motion. CT CHEST FINDINGS Cardiovascular: Left-sided central line which terminates at the high right atrium. Mild cardiomegaly. Right-sided central line terminates at the high SVC. Tortuous thoracic aorta. Pulmonary artery enlargement, 4.0 cm outflow tract. Mediastinum/Nodes: Borderline right paratracheal adenopathy at 10 mm. A node within the  azygoesophageal recess measures 9 mm. Hilar regions poorly evaluated without intravenous contrast. Lungs/Pleura: Small right pleural effusion is grossly similar. Tracheostomy. Right lower lobe airspace disease. More patchy posterior left upper and lower lobe airspace opacities. Musculoskeletal: No acute osseous abnormality. CT ABDOMEN PELVIS FINDINGS Hepatobiliary: Markedly limited evaluation of the liver. Lesion described in the central right lobe on 05/20/2016 is not readily apparent. Multiple small gallstones without surrounding inflammation or biliary duct dilatation. Pancreas: Normal, without mass or ductal dilatation. Spleen: Normal in size, without focal abnormality. Adrenals/Urinary Tract: Normal adrenal glands. No renal calculi or hydronephrosis. 12 mm left renal interpolar exophytic lesion measures greater than fluid density on image 66/ series 3. This is similar in size to on the prior exam. Interpolar right renal lesion measures 2.4 cm and borderline greater than fluid density. Normal urinary bladder. Stomach/Bowel: A nasogastric tube is looped in the stomach, terminating in the gastric body. Catheter in the rectum. Scattered colonic diverticula. Normal small bowel caliber. Vascular/Lymphatic: Normal aortic caliber. 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 The left common iliac is ectatic at 1.9 cm. No gross abdominopelvic adenopathy. Reproductive: Normal prostate. Other: Small bilateral fat containing inguinal hernias. Right sided retroperitoneal complex fluid collection is again identified. This is slightly decreased compared to 05/20/2016. Example 16.7 cm today versus 18.4 cm at the same level. This extends into the pelvis as before. Trace left pericolic gutter fluid is similar.  Anasarca. Musculoskeletal: Bilateral hip osteoarthritis. IMPRESSION: 1. Moderate-to-marked degradation, as detailed above. 2. Similar right pleural effusion with right lower lobe collapse/consolidation which could  represent infection or atelectasis. More patchy left-sided airspace opacities are suspicious for infection or aspiration. 3. No explanation for fever within the abdomen or pelvis. 4. Slight decrease in size of a large right-sided retroperitoneal hematoma. 5. Cholelithiasis. 6. Pulmonary artery  enlargement suggests pulmonary arterial hypertension. 7. Mild thoracic adenopathy, likely reactive. 8. Bilateral common iliac artery dilatation. 9. Bilateral indeterminate renal lesions. These could be re-evaluated with follow-up pre and post contrast CT or MRI (as an outpatient) Electronically Signed   By: Abigail Miyamoto M.D.   On: 06/17/2016 21:03   Dg Chest Port 1 View  Result Date: 06/17/2016 CLINICAL DATA:  Pt has had a fever and is having tenderness throughout his abdomen per MD. Pt cannot relay hx. Pt has shingles EXAM: PORTABLE CHEST 1 VIEW COMPARISON:  06/16/2016 FINDINGS: Tracheostomy tube, feeding tube and central venous line not changed. Bilateral pleural effusions RIGHT greater than LEFT. Mild central venous congestion. No pneumothorax IMPRESSION: 1. Stable support apparatus. 2. Bilateral pleural effusions and central venous congestion increased from prior. Electronically Signed   By: Suzy Bouchard M.D.   On: 06/17/2016 10:08   Dg Abd Portable 1v  Result Date: 06/17/2016 CLINICAL DATA:  71 year old male with history of fever and abdominal tenderness this morning. EXAM: PORTABLE ABDOMEN - 1 VIEW COMPARISON:  05/25/2016. FINDINGS: Feeding tube coiled in the stomach with tip in the antral pre-pyloric region. Gas and stool are seen scattered throughout the colon extending to the level of the distal rectum. No pathologic distension of small bowel is noted. No gross evidence of pneumoperitoneum. IMPRESSION: 1. Tip of feeding tube is in the antral pre-pyloric region of the stomach. 2. Nonobstructive bowel gas pattern. 3. No pneumoperitoneum. Electronically Signed   By: Vinnie Langton M.D.   On: 06/17/2016  10:14    Scheduled: . [START ON 06/19/2016] acyclovir  5 mg/kg (Adjusted) Intravenous Q24H  . ampicillin-sulbactam (UNASYN) IV  3 g Intravenous QHS  . anticoagulant sodium citrate  10 mL Intravenous Once  . budesonide (PULMICORT) nebulizer solution  0.5 mg Nebulization BID  . chlorhexidine gluconate (MEDLINE KIT)  15 mL Mouth Rinse BID  . [START ON Jul 19, 2016] darbepoetin (ARANESP) injection - DIALYSIS  200 mcg Intravenous Q Wed-HD  . feeding supplement (OSMOLITE 1.5 CAL)  1,000 mL Per Tube Q24H  . feeding supplement (PRO-STAT SUGAR FREE 64)  60 mL Per Tube TID  . insulin aspart  0-9 Units Subcutaneous Q4H  . ipratropium-albuterol  3 mL Nebulization BID  . levothyroxine  125 mcg Per Tube QAC breakfast  . mouth rinse  15 mL Mouth Rinse QID  . midodrine  15 mg Per Tube TID WC  . multivitamin  1 tablet Oral QHS  . pantoprazole sodium  40 mg Per Tube Daily    LOS: 41 days   , C 06/18/2016,6:23 PM

## 2016-06-18 NOTE — Progress Notes (Signed)
Repositioned sling and trach collar to keep from blocking trach opening.  Sats remain 95-97%

## 2016-06-19 DIAGNOSIS — M79601 Pain in right arm: Secondary | ICD-10-CM

## 2016-06-19 DIAGNOSIS — B029 Zoster without complications: Secondary | ICD-10-CM

## 2016-06-19 DIAGNOSIS — R197 Diarrhea, unspecified: Secondary | ICD-10-CM

## 2016-06-19 DIAGNOSIS — Y95 Nosocomial condition: Secondary | ICD-10-CM

## 2016-06-19 DIAGNOSIS — A498 Other bacterial infections of unspecified site: Secondary | ICD-10-CM

## 2016-06-19 DIAGNOSIS — Z4659 Encounter for fitting and adjustment of other gastrointestinal appliance and device: Secondary | ICD-10-CM

## 2016-06-19 DIAGNOSIS — R1310 Dysphagia, unspecified: Secondary | ICD-10-CM

## 2016-06-19 LAB — HCV COMMENT:

## 2016-06-19 LAB — RENAL FUNCTION PANEL
Albumin: 1.9 g/dL — ABNORMAL LOW (ref 3.5–5.0)
Anion gap: 11 (ref 5–15)
BUN: 93 mg/dL — AB (ref 6–20)
CHLORIDE: 94 mmol/L — AB (ref 101–111)
CO2: 27 mmol/L (ref 22–32)
CREATININE: 6.11 mg/dL — AB (ref 0.61–1.24)
Calcium: 8.3 mg/dL — ABNORMAL LOW (ref 8.9–10.3)
GFR calc Af Amer: 9 mL/min — ABNORMAL LOW (ref >60)
GFR calc non Af Amer: 8 mL/min — ABNORMAL LOW (ref >60)
GLUCOSE: 102 mg/dL — AB (ref 65–99)
POTASSIUM: 4.3 mmol/L (ref 3.5–5.1)
Phosphorus: 9 mg/dL — ABNORMAL HIGH (ref 2.5–4.6)
Sodium: 132 mmol/L — ABNORMAL LOW (ref 135–145)

## 2016-06-19 LAB — CBC
HCT: 27.2 % — ABNORMAL LOW (ref 39.0–52.0)
Hemoglobin: 8.3 g/dL — ABNORMAL LOW (ref 13.0–17.0)
MCH: 34 pg (ref 26.0–34.0)
MCHC: 30.5 g/dL (ref 30.0–36.0)
MCV: 111.5 fL — AB (ref 78.0–100.0)
Platelets: 300 10*3/uL (ref 150–400)
RBC: 2.44 MIL/uL — AB (ref 4.22–5.81)
RDW: 20.8 % — ABNORMAL HIGH (ref 11.5–15.5)
WBC: 4.3 10*3/uL (ref 4.0–10.5)

## 2016-06-19 LAB — C DIFFICILE QUICK SCREEN W PCR REFLEX
C DIFFICILE (CDIFF) INTERP: NOT DETECTED
C DIFFICILE (CDIFF) TOXIN: NEGATIVE
C DIFFICLE (CDIFF) ANTIGEN: NEGATIVE

## 2016-06-19 LAB — GLUCOSE, CAPILLARY
GLUCOSE-CAPILLARY: 116 mg/dL — AB (ref 65–99)
GLUCOSE-CAPILLARY: 117 mg/dL — AB (ref 65–99)
Glucose-Capillary: 108 mg/dL — ABNORMAL HIGH (ref 65–99)
Glucose-Capillary: 116 mg/dL — ABNORMAL HIGH (ref 65–99)
Glucose-Capillary: 100 mg/dL — ABNORMAL HIGH (ref 65–99)

## 2016-06-19 LAB — HEPATITIS C ANTIBODY (REFLEX)

## 2016-06-19 LAB — PATHOLOGIST SMEAR REVIEW

## 2016-06-19 LAB — HIV ANTIBODY (ROUTINE TESTING W REFLEX): HIV SCREEN 4TH GENERATION: NONREACTIVE

## 2016-06-19 MED ORDER — VANCOMYCIN HCL IN DEXTROSE 1-5 GM/200ML-% IV SOLN
1000.0000 mg | INTRAVENOUS | Status: AC
  Administered 2016-06-19: 1000 mg via INTRAVENOUS
  Filled 2016-06-19: qty 200

## 2016-06-19 MED ORDER — SODIUM CHLORIDE 0.9 % IV BOLUS (SEPSIS)
500.0000 mL | Freq: Once | INTRAVENOUS | Status: AC
  Administered 2016-06-19: 500 mL via INTRAVENOUS

## 2016-06-19 MED ORDER — SODIUM CHLORIDE 0.9 % IV SOLN
3.0000 g | Freq: Every day | INTRAVENOUS | Status: DC
  Administered 2016-06-19: 3 g via INTRAVENOUS
  Filled 2016-06-19 (×2): qty 3

## 2016-06-19 MED ORDER — AMIODARONE HCL 200 MG PO TABS
400.0000 mg | ORAL_TABLET | Freq: Two times a day (BID) | ORAL | Status: DC
  Administered 2016-06-19 – 2016-06-20 (×3): 400 mg via ORAL
  Filled 2016-06-19 (×3): qty 2

## 2016-06-19 MED ORDER — FENTANYL CITRATE (PF) 100 MCG/2ML IJ SOLN
INTRAMUSCULAR | Status: AC
  Filled 2016-06-19: qty 2

## 2016-06-19 MED ORDER — FENTANYL CITRATE (PF) 100 MCG/2ML IJ SOLN
12.5000 ug | INTRAMUSCULAR | Status: DC | PRN
  Administered 2016-06-19 – 2016-06-20 (×2): 25 ug via INTRAVENOUS
  Filled 2016-06-19: qty 2

## 2016-06-19 MED ORDER — FENTANYL CITRATE (PF) 100 MCG/2ML IJ SOLN: 12.5000 ug | INTRAMUSCULAR | Status: DC | PRN

## 2016-06-19 NOTE — Progress Notes (Addendum)
Notified provider of hypotension and tachycardia. Orders received for 500 ml NS bolus

## 2016-06-19 NOTE — Progress Notes (Signed)
PROGRESS NOTE  Omar [name]  ERX:[id number] DOB: Jul 21, 1945 DOA: 05/21/2016 PCP: Redge Gainer, MD  Brief Narrative:   71 y/o male from [place] admitted 3/5 with altered mental status + H.flu PNA with aspiration, sepsis, hypoxic/hypercapnic respiratory failure. Course c/b retroperitoneal bleed while on heparin with subsequent shock, acute renal failure & prolonged ventilation requiring tracheostomy.  On CRRT intermittently through hosp and has not tolerated HD due to hypotension.  He has had a tunneled HD catheter placed.  He continues to have significant hypotension preventing placement of permanent feeding tube.  Continuing to have SLP reevaluate.   Started on dys I diet per SLP 4/10.  Palliative care is following.  Needs surgical G-tube placement once blood pressures have improved enough to tolerate anesthesia.    Antibiotics: Zosyn 3/06 >> 3/08 Rocephin (H.Flu PNA) 3/08 >> 3/14 Vancomycin 3/23 >> 3/28 Cefepime 3/23 >> 4/1 Meropenem 4/1 >> Vancomycin 4/3 >> 4/4  Zovirax 4/11  Cultures: Urine 3/05 >> negative Blood 3/06 >> negative Resp 3/6 >> H flu (b-lactamase +) Trach stoma site 3/23 >> diphtheroids Resp cx 3/28 >>nl flora Blood 4/1 NGTD Urine 4/10: yeast Blood 4/14: 1/2 coag neg staph  Lines/tubes: ETT 3/05 >> 3/8 ETT 3/10 >> 3/21 Trach (Dr Redmond Baseman) 3/21 >>  Lt IJ CVL 3/07 >> 3/27 R HD catheter  >> 3/22 R IJ HD catheter 3/23 >>   Events: 3/05  Transfer to [facility] Sputum 3/06 >> Haemophilus influenzae (beta lactamase positive) Echo 3/06 >> EF 65 to 70%, mod LVH, mod TR, PAS 39 mmHg CT head 3/7 >> no acute abnormality  3/10 - Large retroperitoneal bleed noted on CT after c/o back pain/leg pain 3/27 rt humerus fracture identified - ortho consult 3/27 RUQ [place] .Marland Kitchen Not helpful due to body habitus. On ATC with copious secrtions.  3/29 - Chronic  critically ill 3/27 - end CRRT 3/30 - doing ATC x 24-48h per RN. Rt shoulder pain only when touched per RN. Making urine. Off CRRT. Not  on pressors. Family not fully on board about LTAC per RN but looking at [facility]. Needs LTAC.  4/2 - resumed CRRT 4/3 - Family agreeable to PEG tube 4/5 - G-tube to be placed by gen surg however anesthesia refused 2/2 hypotension.  4/6 - restarted HD. Tolerated.  4/7 - transferred to SDU 4/8 - tunneled HD catheter by IR 4/9 - recurrent sepsis/fevers 4/10 - shingles outbreak 4/14 - CT chest/abd/pelvis ID consult 4/15 - Cardiology consult  Assessment & Plan:    Active Problems:   Paroxysmal atrial fibrillation (HCC)   Hemiparesis affecting right side as late effect of cerebrovascular accident [facility])   Hypothyroidism   Acute respiratory failure with hypoxemia (HCC)   Shock circulatory (HCC)   Altered mental status   Retroperitoneal hemorrhage   Goals of care, counseling/discussion   On enteral nutrition   Acute renal failure (ARF) (HCC)   Electrolyte imbalance   Shock liver   Current use of proton pump inhibitor   Contraindication to deep vein thrombosis (DVT) prophylaxis   Hepatic lesion   Palliative care encounter   Acute respiratory failure with hypercapnia (HCC)   AKI (acute kidney injury) ([place])   Stage 5 chronic kidney disease on chronic dialysis ([place])   HCAP (healthcare-associated pneumonia)   Palliative care by specialist   Tracheostomy care ([place])   Pressure injury of skin   Abdominal pain   FUO (fever of unknown origin)   Fracture of proximal humerus   Arm pain  Pleural effusion  Acute on chronic hypoxemic respiratory failure, improved.   Pneumonia, H influenza (beta lactamase positive), treated New HCAP on 4/1, treated Pulm edema, improved Presumed OSA/OHS History of asthma Cont ATC, keep o2 sats > 88% Off vent since 4/2 duoneb qid + pulmicort BID Completed a course of meropenem on 4/8 for HCAP Now on unasyn for 8 days for recurrent aspiration pneumonia, unasyn started 4/15 Follow up with ENT and trach clinic outpatient, continue regular trach care  Septic  and hemorrhagic shock, resolved. Off pressors 3/31 but had been intermittently hypotensive, slowly improving Paroxysmal atrial fibrillation History of hypertension Off stress dose steroids, continue to hold anti-hypertensives. Continue midodrine to 81m TID (started on 4/5 by nephrology) Blood cultures 4/15: coag neg staph Repeat CXR 4/14: probable aspiration pneumonia Tracheal aspirate culture G+ cocci in pair, few G - rods Varicella zoster PCR positive - on zovirax per pharmacy dosing for full 2 weeks Contact and Airborne precautions  Vesicular/bullous rash on right chest, consider metal allergy from the sling he has been wearing.  DDx also include shingles.  T4 dermatome and has a large ulcerated area on his back in same dermatomal distribution -  Varicella PCR positive -  Cover metal ring with tape -  Airborne and contact precautions until lesions dried and crusted - started acyclovir 4/11   Fever - unknown source - asked for ID consult for assistance. CT chest abd pelvis obtained 4/14:  left-sided airspace opacities are suspicious for infection or aspiration, no explanation for fever seen in Abd/pelvis.   Pulling out dialysis lines as possible, culture tip, removed the right IJ 4/16.  Now on unasyn and vancomycin per ID.    Mental status change - CT head negative for acute findings.      New ESRD, Acute renal failure, presumed due to ATN in the setting of hemorrhagic shock. Pt intermittently on CRRT and  Currently on HD since 4/6 HD MWF Midodrine increasedbut still having soft BPs Tunneled HD catheter placed on 4/9 by IR may need to be removed secondary to fevers.   Large retroperitoneal bleed noted 3/10, some slight increased 3/18 CT scan. Hb has been stable.   Shock liver improving.  Hepatic lesion; noted on CT scan of the abdomen 3/18. 5 cm lesion of the dome of the liver, could represent blood or infection or infarct.  Protein calorie malnutrition Continue PPI Continue tube  feeds, not mentally alert enough to eat now. PEG/G tube unable to be placed.  IR unable to place 2/2 complicated anatomy. Gen surg unable to place 2/2 hypotension. [facility] may be able to take patient and make arrangements for his PEG tube, family wants him transferred to tertiary care center and I've been working on that.   Acute blood loss anemia superimposed on anemia of chronic kidney disease, improved Severe heparin sensitivity, associated with hemorrhage Thrombocytopenia, acute on chronic Thrombocytopenia improving Follow CBC, Goal hemoglobin greater than 7.0.  No recent bleeding or transfusion.  No heparin for now.  Hold anticoagulation due to retroperitoneal bleed SCD for DVT prophylaxis  H. influenzae pneumonia and HCAP treatment completed [facility], [facility] 3[facility]23, [facility].  Completed meropenem on 4/8.   Hyperglycemia, much improved and stable  Hypothyroidism, stable, continue synthroid  Metabolic uremic encephalopathy, still with signs of encephalopathy History of CVA with right hemiplegia Rt humerus fracture  Mentation improving PT consult - LTAC recommended.  RUE sling Morphine prn pain  FAMILY DISCUSSIONS: Planning a family meeting  at 5pm 4/16 with palliative care team for goals care and update of condition. Spoke with daughter today for permission to place PICC line for vascular access.    Dr. Corrie Dandy spoke with granddaughter and updated her of pts condition that he was transferred to SDU.   Family requested [facility] transfer and patient is on list if bed becomes available.  I called them 4/14 for update and they said they would check on it and call me back but they never called me back.  Plan for LTAC, [facility] expressed that they might be able to take him.  They have [place] kidney coverage over at [place].       DVT prophylaxis:  SCDs Code Status:  full Family Communication:   Disposition Plan:  LTAC  Consultants:     General surgery  Nephrology  ENT  Orthopedic surgery, Dr. Jacqulynn Cadet  Palliative care  Interventional radiology  Infectious Disease   Antimicrobials:  Anti-infectives    Start     Dose/Rate Route Frequency Ordered Stop   06/19/16 2200  acyclovir (ZOVIRAX) 500 mg in dextrose 5 % 100 mL IVPB     5 mg/kg  100 kg (Adjusted) 110 mL/hr over 60 Minutes Intravenous Every 24 hours 06/18/16 1135     06/19/16 1200  vancomycin (VANCOCIN) IVPB 1000 mg/200 mL premix  Status:  Discontinued     1,000 mg 200 mL/hr over 60 Minutes Intravenous Every M-W-F (Hemodialysis) 06/16/16 1255 06/18/16 1327   06/18/16 1500  Ampicillin-Sulbactam (UNASYN) 3 g in sodium chloride 0.9 % 100 mL IVPB     3 g 200 mL/hr over 30 Minutes Intravenous Daily at bedtime 06/18/16 1408     06/18/16 1230  acyclovir (ZOVIRAX) 500 mg in dextrose 5 % 100 mL IVPB     5 mg/kg  100 kg (Adjusted) 110 mL/hr over 60 Minutes Intravenous  Once 06/18/16 1125 06/18/16 1330   06/16/16 2200  acyclovir (ZOVIRAX) tablet 600 mg  Status:  Discontinued     600 mg Oral Once per day on Mon Wed Fri 06/15/16 1126 06/15/16 1145   06/16/16 2200  acyclovir (ZOVIRAX) 200 MG capsule 600 mg  Status:  Discontinued     600 mg Oral Once per day on Mon Wed Fri 06/15/16 1443 06/17/16 1326   06/16/16 2000  ceftAZIDime (FORTAZ) IVPB 2 g  Status:  Discontinued     2 g 100 mL/hr over 30 Minutes Intravenous Every M-W-F (2000) 06/15/16 1438 06/16/16 1652   06/16/16 2000  cefTAZidime (FORTAZ) 2 g in dextrose 5 % 50 mL IVPB  Status:  Discontinued     2 g 100 mL/hr over 30 Minutes Intravenous Every M-W-F (2000) 06/16/16 1652 06/18/16 0947   06/16/16 1200  vancomycin (VANCOCIN) IVPB 750 mg/150 ml premix     750 mg 150 mL/hr over 60 Minutes Intravenous Every Fri (Hemodialysis) 06/16/16 1157 06/16/16 1421   06/16/16 1000  acyclovir (ZOVIRAX) 200 MG capsule 600 mg  Status:  Discontinued     600 mg Oral Once per day on Mon Wed Fri 06/15/16 1145 06/15/16 1443    06/16/16 1000  acyclovir (ZOVIRAX) tablet 200 mg  Status:  Discontinued     200 mg Oral Daily 06/15/16 1435 06/16/16 0415   06/16/16 1000  acyclovir (ZOVIRAX) 200 MG capsule 200 mg  Status:  Discontinued     200 mg Oral Daily 06/16/16 0416 06/17/16 1326   06/15/16 2200  acyclovir (ZOVIRAX) 200 MG capsule 200 mg  Status:  Discontinued  200 mg Oral 2 times daily 06/15/16 0801 06/15/16 1126   06/15/16 2200  acyclovir (ZOVIRAX) tablet 200 mg  Status:  Discontinued     200 mg Oral Once per day on Sun Tue Thu Sat 06/15/16 1126 06/15/16 1144   06/15/16 2200  acyclovir (ZOVIRAX) 200 MG capsule 200 mg  Status:  Discontinued     200 mg Oral Once per day on Sun Tue Thu Sat 06/15/16 1144 06/17/16 1326   06/15/16 1200  acyclovir (ZOVIRAX) 200 MG capsule 200 mg  Status:  Discontinued     200 mg Oral Daily 06/15/16 1141 06/15/16 1435   06/15/16 1130  acyclovir (ZOVIRAX) tablet 400 mg     400 mg Oral  Once 06/15/16 1117 06/15/16 1446   06/15/16 1130  Ceftazidime (FORTAZ) injection 2 g  Status:  Discontinued     2 g Intravenous  Once 06/15/16 1117 06/15/16 1118   06/15/16 1130  cefTAZidime (FORTAZ) 2 g in dextrose 5 % 50 mL IVPB     2 g 100 mL/hr over 30 Minutes Intravenous  Once 06/15/16 1122 06/15/16 1240   06/15/16 1130  acyclovir (ZOVIRAX) tablet 200 mg  Status:  Discontinued     200 mg Oral  Every morning - 10a 06/15/16 1126 06/15/16 1141   06/15/16 0900  cefTAZidime (FORTAZ) 2 g in dextrose 5 % 50 mL IVPB  Status:  Discontinued     2 g 100 mL/hr over 30 Minutes Intravenous  Once 06/15/16 0720 06/15/16 1117   06/15/16 0900  acyclovir (ZOVIRAX) 200 MG capsule 400 mg  Status:  Discontinued     400 mg Oral  Once 06/15/16 0801 06/15/16 1117   06/15/16 0000  acyclovir (ZOVIRAX) 200 MG capsule 200 mg  Status:  Discontinued     200 mg Oral 2 times daily 06/14/16 2353 06/15/16 0801   06/14/16 2200  acyclovir (ZOVIRAX) tablet 200 mg  Status:  Discontinued     200 mg Oral 2 times daily 06/14/16 1533  06/14/16 2353   06/14/16 1800  cefTAZidime (FORTAZ) 2 g in dextrose 5 % 50 mL IVPB  Status:  Discontinued     2 g 100 mL/hr over 30 Minutes Intravenous Every M-W-F (Hemodialysis) 06/13/16 1338 06/15/16 1438   06/14/16 1600  acyclovir (ZOVIRAX) tablet 400 mg  Status:  Discontinued     400 mg Oral  Once 06/14/16 1533 06/15/16 0801   06/14/16 1200  vancomycin (VANCOCIN) IVPB 1000 mg/200 mL premix  Status:  Discontinued     1,000 mg 200 mL/hr over 60 Minutes Intravenous Every M-W-F (Hemodialysis) 06/13/16 1338 06/16/16 1157   06/13/16 1300  vancomycin (VANCOCIN) 2,000 mg in sodium chloride 0.9 % 500 mL IVPB     2,000 mg 250 mL/hr over 120 Minutes Intravenous  Once 06/13/16 1244 06/13/16 1645   06/13/16 1300  cefTAZidime (FORTAZ) 2 g in dextrose 5 % 50 mL IVPB     2 g 100 mL/hr over 30 Minutes Intravenous  Once 06/13/16 1244 06/13/16 1506   06/12/16 1448  ceFAZolin (ANCEF) 2-4 GM/100ML-% IVPB  Status:  Discontinued    Comments:  Shaaron Adler   : cabinet override      06/12/16 1448 06/12/16 1454   06/12/16 1432  ceFAZolin (ANCEF) 2-4 GM/100ML-% IVPB    Comments:  Laughlin, Amy   : cabinet override      06/12/16 1432 06/12/16 1436   06/20/2016 0900  meropenem (MERREM) 500 mg in sodium chloride 0.9 % 50 mL IVPB  500 mg 100 mL/hr over 30 Minutes Intravenous Every 24 hours 06/07/16 0955 06/11/16 1109   06/27/2016 0600  cefoTEtan (CEFOTAN) 2 g in dextrose 5 % 50 mL IVPB  Status:  Discontinued     2 g 100 mL/hr over 30 Minutes Intravenous To ShortStay Surgical 06/07/16 1354 06/04/2016 1157   06/06/16 1200  vancomycin (VANCOCIN) 1,500 mg in sodium chloride 0.9 % 500 mL IVPB  Status:  Discontinued     1,500 mg 250 mL/hr over 120 Minutes Intravenous Every 24 hours 06/06/16 0818 06/07/16 0954   06/05/16 1000  meropenem (MERREM) 1 g in sodium chloride 0.9 % 100 mL IVPB  Status:  Discontinued     1 g 200 mL/hr over 30 Minutes Intravenous Every 12 hours 06/05/16 0926 06/07/16 0955   06/04/16 1400   vancomycin (VANCOCIN) 2,000 mg in sodium chloride 0.9 % 500 mL IVPB     2,000 mg 250 mL/hr over 120 Minutes Intravenous  Once 06/04/16 1234 06/04/16 1737   06/04/16 1400  meropenem (MERREM) 500 mg in sodium chloride 0.9 % 50 mL IVPB  Status:  Discontinued     500 mg 100 mL/hr over 30 Minutes Intravenous Every 24 hours 06/04/16 1234 06/05/16 0926   05/30/16 2300  ceFEPIme (MAXIPIME) 1 g in dextrose 5 % 50 mL IVPB  Status:  Discontinued     1 g 100 mL/hr over 30 Minutes Intravenous Every 24 hours 05/30/16 1223 06/04/16 1231   05/27/16 1400  ceFEPIme (MAXIPIME) 2 g in dextrose 5 % 50 mL IVPB  Status:  Discontinued     2 g 100 mL/hr over 30 Minutes Intravenous Every 12 hours 05/27/16 1014 05/30/16 1223   05/27/16 1400  vancomycin (VANCOCIN) 1,500 mg in sodium chloride 0.9 % 500 mL IVPB  Status:  Discontinued     1,500 mg 250 mL/hr over 120 Minutes Intravenous Every 24 hours 05/27/16 1014 05/30/16 1224   05/26/16 1300  ceFEPIme (MAXIPIME) 1 g in dextrose 5 % 50 mL IVPB  Status:  Discontinued     1 g 100 mL/hr over 30 Minutes Intravenous Every 24 hours 05/26/16 1225 05/27/16 1014   05/26/16 1230  vancomycin (VANCOCIN) 2,500 mg in sodium chloride 0.9 % 500 mL IVPB     2,500 mg 250 mL/hr over 120 Minutes Intravenous  Once 05/26/16 1225 05/26/16 1530   05/16/16 1400  cefTRIAXone (ROCEPHIN) 2 g in dextrose 5 % 50 mL IVPB     2 g 100 mL/hr over 30 Minutes Intravenous Every 24 hours 05/15/16 1649 05/17/16 1458   05/11/16 1400  cefTRIAXone (ROCEPHIN) 1 g in dextrose 5 % 50 mL IVPB  Status:  Discontinued     1 g 100 mL/hr over 30 Minutes Intravenous Every 24 hours 05/11/16 0833 05/15/16 1649   05/09/16 1400  piperacillin-tazobactam (ZOSYN) IVPB 3.375 g  Status:  Discontinued     3.375 g 12.5 mL/hr over 240 Minutes Intravenous Every 8 hours 05/09/16 1114 05/11/16 0833     Subjective:  Patient much less responsive.   Review of systems: Pt unable to answer because of current condition    Objective: Vitals:   06/19/16 0330 06/19/16 0400 06/19/16 0514 06/19/16 0910  BP:  (!) 101/59 (!) 92/54 96/65  Pulse: (!) 130 (!) 133 (!) 120 (!) 121  Resp: 20 (!) 34 19 (!) 25  Temp:   (!) 101.2 F (38.4 C) 99.4 F (37.4 C)  TempSrc:   Oral Axillary  SpO2: 92% 95% 94% 98%  Height:  Intake/Output Summary (Last 24 hours) at 06/19/16 0929 Last data filed at 06/18/16 1600  Gross per 24 hour  Intake              430 ml  Output                0 ml  Net              430 ml   Filed Weights    Examination:  General exam:  Morbidly obese male, trach collar in place with rhonchorous breath sounds, lethargic, arousable HEENT:  MMM, trach collar in place Respiratory system:  no focal rales or wheezes Cardiovascular system: tachycardic, normal S1/S2. No murmurs, rubs, gallops or clicks.  Warm extremities Gastrointestinal system: Hyperactive bowel sounds, soft, nondistended, nontender. MSK:  Decreased bulk and strength.  Right arm in sling.  2+ pitting bilateral lower extremity edema.  Neuro:  3 out of 5 strength of the right upper and right lower extremities, 4-5 strength left upper and lower extremities Skin:  Herpetic rash on right chest wall and back drying and crusting.  Has a large ulcerated area on his back in same distribution.  No vesicles under right arm seem.  Base of lesions is not particularly erythematous.   GU:  Urine in foley is dark brown.   Data Reviewed: I have personally reviewed following labs and imaging studies  CBC:  Recent Labs Lab 06/17/16 0500 06/17/16 2304 06/18/16 0930 06/18/16 2030 06/19/16 0548  WBC 4.5 3.9* 4.3 3.5* 4.3  NEUTROABS  --  0.2*  --   --   --   HGB 7.3* 7.7* 8.4* 8.6* 8.3*  HCT 24.2* 24.2* 27.0* 27.4* 27.2*  MCV 112.6* 111.0* 111.1* 110.9* 111.5*  PLT 200 225 263 290 818   Basic Metabolic Panel:  Recent Labs Lab 06/17/16 0500 06/17/16 2304 06/18/16 0839 06/18/16 0930 06/18/16 2030 06/19/16 0548  NA 132* 130* 130*  130* 132* 132*  K 3.8 4.0 4.0 4.1 4.1 4.3  CL 98* 96* 95* 96* 95* 94*  CO2 '30 28 28 28 27 27  ' GLUCOSE 115* 110* 101* 104* 101* 102*  BUN 41* 57* 77* 79* 84* 93*  CREATININE 3.61* 4.56* 5.00* 5.19* 5.74* 6.11*  CALCIUM 7.9* 8.1* 8.2* 8.1* 8.4* 8.3*  PHOS 6.8* 5.6* 7.3*  --  6.9* 9.0*   GFR: Estimated Creatinine Clearance: 15.5 mL/min (A) (by C-G formula based on SCr of 6.11 mg/dL (H)). Liver Function Tests:  Recent Labs Lab 06/17/16 2304 06/18/16 0839 06/18/16 0930 06/18/16 2030 06/19/16 0548  AST  --   --  25  --   --   ALT  --   --  7*  --   --   ALKPHOS  --   --  84  --   --   BILITOT  --   --  1.3*  --   --   PROT  --   --  7.1  --   --   ALBUMIN 1.8* 1.9* 1.9* 1.9* 1.9*   No results for input(s): LIPASE, AMYLASE in the last 168 hours. No results for input(s): AMMONIA in the last 168 hours. Coagulation Profile: No results for input(s): INR, PROTIME in the last 168 hours. Cardiac Enzymes: No results for input(s): CKTOTAL, CKMB, CKMBINDEX, TROPONINI in the last 168 hours. BNP (last 3 results) No results for input(s): PROBNP in the last 8760 hours. HbA1C: No results for input(s): HGBA1C in the last 72 hours. CBG:  Recent Labs Lab 06/18/16  1210 06/18/16 1738 06/18/16 1934 06/19/16 0511 06/19/16 0808  GLUCAP 91 122* 97 108* 116*   Lipid Profile: No results for input(s): CHOL, HDL, LDLCALC, TRIG, CHOLHDL, LDLDIRECT in the last 72 hours. Thyroid Function Tests: No results for input(s): TSH, T4TOTAL, FREET4, T3FREE, THYROIDAB in the last 72 hours. Anemia Panel: No results for input(s): VITAMINB12, FOLATE, FERRITIN, TIBC, IRON, RETICCTPCT in the last 72 hours. Urine analysis:    Component Value Date/Time   COLORURINE AMBER (A) 06/13/2016 0808   APPEARANCEUR CLOUDY (A) 06/13/2016 0808   LABSPEC 1.023 06/13/2016 0808   PHURINE 6.0 06/13/2016 0808   GLUCOSEU NEGATIVE 06/13/2016 0808   HGBUR LARGE (A) 06/13/2016 0808   BILIRUBINUR NEGATIVE 06/13/2016 0808    BILIRUBINUR small 10/06/2014 1612   KETONESUR NEGATIVE 06/13/2016 0808   PROTEINUR 100 (A) 06/13/2016 0808   UROBILINOGEN 2.0 10/06/2014 1612   UROBILINOGEN 4.0 (H) 05/30/2010 1511   NITRITE NEGATIVE 06/13/2016 0808   LEUKOCYTESUR MODERATE (A) 06/13/2016 0808    Recent Results (from the past 240 hour(s))  MRSA PCR Screening     Status: Abnormal   Collection Time: 06/11/16  4:18 AM  Result Value Ref Range Status   MRSA by PCR INVALID RESULTS, SPECIMEN SENT FOR CULTURE (A) NEGATIVE Final    Comment: NOTIFIED RN JENNIFER AT [phone] 06/11/16 BY A.DAVIS  MRSA culture     Status: None   Collection Time: 06/11/16  4:18 AM  Result Value Ref Range Status   Specimen Description NASAL SWAB  Final   Special Requests NONE  Final   Culture NO MRSA DETECTED  Final   Report Status 06/14/2016 FINAL  Final  Culture, Urine     Status: Abnormal   Collection Time: 06/13/16  8:08 AM  Result Value Ref Range Status   Specimen Description URINE, RANDOM  Final   Special Requests Normal  Final   Culture >=100,000 COLONIES/mL YEAST (A)  Final   Report Status 06/14/2016 FINAL  Final  Culture, respiratory (NON-Expectorated)     Status: None   Collection Time: 06/13/16  9:04 AM  Result Value Ref Range Status   Specimen Description TRACHEAL ASPIRATE  Final   Special Requests Normal  Final   Gram Stain   Final    MODERATE WBC PRESENT,BOTH PMN AND MONONUCLEAR FEW GRAM POSITIVE COCCI IN PAIRS IN CLUSTERS RARE GRAM NEGATIVE RODS    Culture Consistent with normal respiratory flora.  Final   Report Status 06/15/2016 FINAL  Final  Culture, blood (Routine X 2) w Reflex to ID Panel     Status: None   Collection Time: 06/13/16  9:44 AM  Result Value Ref Range Status   Specimen Description BLOOD LEFT ARM  Final   Special Requests IN PEDIATRIC BOTTLE Blood Culture adequate volume  Final   Culture NO GROWTH 5 DAYS  Final   Report Status 06/18/2016 FINAL  Final  Culture, blood (Routine X 2) w Reflex to ID Panel      Status: None   Collection Time: 06/13/16 10:49 AM  Result Value Ref Range Status   Specimen Description BLOOD LEFT HAND  Final   Special Requests IN PEDIATRIC BOTTLE BCAV  Final   Culture NO GROWTH 5 DAYS  Final   Report Status 06/18/2016 FINAL  Final  Culture, blood (Routine X 2) w Reflex to ID Panel     Status: None (Preliminary result)   Collection Time: 06/17/16  9:45 AM  Result Value Ref Range Status   Specimen Description BLOOD RIGHT HAND  Final   Special Requests IN PEDIATRIC BOTTLE Blood Culture adequate volume  Final   Culture NO GROWTH 1 DAY  Final   Report Status PENDING  Incomplete  Culture, blood (Routine X 2) w Reflex to ID Panel     Status: Abnormal (Preliminary result)   Collection Time: 06/17/16  9:49 AM  Result Value Ref Range Status   Specimen Description BLOOD RIGHT HAND  Final   Special Requests IN PEDIATRIC BOTTLE Blood Culture adequate volume  Final   Culture  Setup Time   Final    GRAM POSITIVE COCCI IN CLUSTERS IN PEDIATRIC BOTTLE CRITICAL RESULT CALLED TO, READ BACK BY AND VERIFIED WITH: PHARMD N GAZDA [id number] MLM    Culture (A)  Final    STAPHYLOCOCCUS SPECIES (COAGULASE NEGATIVE) THE SIGNIFICANCE OF ISOLATING THIS ORGANISM FROM A SINGLE SET OF BLOOD CULTURES WHEN MULTIPLE SETS ARE DRAWN IS UNCERTAIN. PLEASE NOTIFY THE MICROBIOLOGY DEPARTMENT WITHIN ONE WEEK IF SPECIATION AND SENSITIVITIES ARE REQUIRED.    Report Status PENDING  Incomplete  Blood Culture ID Panel (Reflexed)     Status: Abnormal   Collection Time: 06/17/16  9:49 AM  Result Value Ref Range Status   Enterococcus species NOT DETECTED NOT DETECTED Final   Listeria monocytogenes NOT DETECTED NOT DETECTED Final   Staphylococcus species DETECTED (A) NOT DETECTED Final    Comment: Methicillin (oxacillin) resistant coagulase negative staphylococcus. Possible blood culture contaminant (unless isolated from more than one blood culture draw or clinical case suggests pathogenicity). No antibiotic  treatment is indicated for blood  culture contaminants. CRITICAL RESULT CALLED TO, READ BACK BY AND VERIFIED WITH: PHARMD N GAZDA [id number] MLM    Staphylococcus aureus NOT DETECTED NOT DETECTED Final   Methicillin resistance DETECTED (A) NOT DETECTED Final    Comment: CRITICAL RESULT CALLED TO, READ BACK BY AND VERIFIED WITH: PHARMD N GAZDA [id number] MLM    Streptococcus species NOT DETECTED NOT DETECTED Final   Streptococcus agalactiae NOT DETECTED NOT DETECTED Final   Streptococcus pneumoniae NOT DETECTED NOT DETECTED Final   Streptococcus pyogenes NOT DETECTED NOT DETECTED Final   Acinetobacter baumannii NOT DETECTED NOT DETECTED Final   Enterobacteriaceae species NOT DETECTED NOT DETECTED Final   Enterobacter cloacae complex NOT DETECTED NOT DETECTED Final   Escherichia coli NOT DETECTED NOT DETECTED Final   Klebsiella oxytoca NOT DETECTED NOT DETECTED Final   Klebsiella pneumoniae NOT DETECTED NOT DETECTED Final   Proteus species NOT DETECTED NOT DETECTED Final   Serratia marcescens NOT DETECTED NOT DETECTED Final   Haemophilus influenzae NOT DETECTED NOT DETECTED Final   Neisseria meningitidis NOT DETECTED NOT DETECTED Final   Pseudomonas aeruginosa NOT DETECTED NOT DETECTED Final   Candida albicans NOT DETECTED NOT DETECTED Final   Candida glabrata NOT DETECTED NOT DETECTED Final   Candida krusei NOT DETECTED NOT DETECTED Final   Candida parapsilosis NOT DETECTED NOT DETECTED Final   Candida tropicalis NOT DETECTED NOT DETECTED Final    Radiology Studies: Ct Abdomen Pelvis Wo Contrast  Result Date: 06/17/2016 CLINICAL DATA:  Fever of unknown origin. Morbid obesity. Thrombocytopenia. EXAM: CT CHEST, ABDOMEN AND PELVIS WITHOUT CONTRAST TECHNIQUE: Multidetector CT imaging of the chest, abdomen and pelvis was performed following the standard protocol without IV contrast. COMPARISON:  Abdominopelvic CT of 06/06/2016. Chest radiograph 06/17/2016. No comparison chest CT.  FINDINGS: Multifactorial degradation, including patient morbid obesity, arms not raised above the head, lack of IV contrast, and mild motion. CT CHEST FINDINGS Cardiovascular: Left-sided central line which terminates  at the high right atrium. Mild cardiomegaly. Right-sided central line terminates at the high SVC. Tortuous thoracic aorta. Pulmonary artery enlargement, 4.0 cm outflow tract. Mediastinum/Nodes: Borderline right paratracheal adenopathy at 10 mm. A node within the azygoesophageal recess measures 9 mm. Hilar regions poorly evaluated without intravenous contrast. Lungs/Pleura: Small right pleural effusion is grossly similar. Tracheostomy. Right lower lobe airspace disease. More patchy posterior left upper and lower lobe airspace opacities. Musculoskeletal: No acute osseous abnormality. CT ABDOMEN PELVIS FINDINGS Hepatobiliary: Markedly limited evaluation of the liver. Lesion described in the central right lobe on 05/20/2016 is not readily apparent. Multiple small gallstones without surrounding inflammation or biliary duct dilatation. Pancreas: Normal, without mass or ductal dilatation. Spleen: Normal in size, without focal abnormality. Adrenals/Urinary Tract: Normal adrenal glands. No renal calculi or hydronephrosis. 12 mm left renal interpolar exophytic lesion measures greater than fluid density on image 66/ series 3. This is similar in size to on the prior exam. Interpolar right renal lesion measures 2.4 cm and borderline greater than fluid density. Normal urinary bladder. Stomach/Bowel: A nasogastric tube is looped in the stomach, terminating in the gastric body. Catheter in the rectum. Scattered colonic diverticula. Normal small bowel caliber. Vascular/Lymphatic: Normal aortic caliber. 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 The left common iliac is ectatic at 1.9 cm. No gross abdominopelvic adenopathy. Reproductive: Normal prostate. Other: Small bilateral fat containing inguinal hernias. Right  sided retroperitoneal complex fluid collection is again identified. This is slightly decreased compared to 05/20/2016. Example 16.7 cm today versus 18.4 cm at the same level. This extends into the pelvis as before. Trace left pericolic gutter fluid is similar.  Anasarca. Musculoskeletal: Bilateral hip osteoarthritis. IMPRESSION: 1. Moderate-to-marked degradation, as detailed above. 2. Similar right pleural effusion with right lower lobe collapse/consolidation which could represent infection or atelectasis. More patchy left-sided airspace opacities are suspicious for infection or aspiration. 3. No explanation for fever within the abdomen or pelvis. 4. Slight decrease in size of a large right-sided retroperitoneal hematoma. 5. Cholelithiasis. 6. Pulmonary artery enlargement suggests pulmonary arterial hypertension. 7. Mild thoracic adenopathy, likely reactive. 8. Bilateral common iliac artery dilatation. 9. Bilateral indeterminate renal lesions. These could be re-evaluated with follow-up pre and post contrast CT or MRI (as an outpatient) Electronically Signed   By: Abigail Miyamoto M.D.   On: 06/17/2016 21:03   Ct Head Wo Contrast  Result Date: 06/18/2016 CLINICAL DATA:  Mental status change.  Inpatient. EXAM: CT HEAD WITHOUT CONTRAST TECHNIQUE: Contiguous axial images were obtained from the base of the skull through the vertex without intravenous contrast. COMPARISON:  06/04/2016 head CT. FINDINGS: Motion degraded scan. Brain: No evidence of parenchymal hemorrhage or extra-axial fluid collection. No mass lesion, mass effect, or midline shift. No CT evidence of acute infarction. Generalized cerebral volume loss. Nonspecific mild to moderate subcortical and periventricular white matter hypodensity, most in keeping with chronic small vessel ischemic change. No ventriculomegaly. Vascular: Intracranial atherosclerosis.  No acute abnormality. Skull: No evidence of calvarial fracture. Sinuses/Orbits: Mucoperiosteal  thickening in the bilateral ethmoidal air cells, right frontal sinus and sphenoid sinuses. No fluid levels. Other: Partial bilateral mastoid effusions, left greater than right. Right nasal route tube enters the hypopharynx on the scout tomograms. IMPRESSION: 1.  No evidence of acute intracranial abnormality. 2. Generalized cerebral volume loss and chronic small vessel ischemia. 3. Paranasal sinusitis. 4. Bilateral partial mastoid effusions. Electronically Signed   By: Ilona Sorrel M.D.   On: 06/18/2016 23:16   Ct Chest Wo Contrast  Result  Date: 06/17/2016 CLINICAL DATA:  Fever of unknown origin. Morbid obesity. Thrombocytopenia. EXAM: CT CHEST, ABDOMEN AND PELVIS WITHOUT CONTRAST TECHNIQUE: Multidetector CT imaging of the chest, abdomen and pelvis was performed following the standard protocol without IV contrast. COMPARISON:  Abdominopelvic CT of 06/06/2016. Chest radiograph 06/17/2016. No comparison chest CT. FINDINGS: Multifactorial degradation, including patient morbid obesity, arms not raised above the head, lack of IV contrast, and mild motion. CT CHEST FINDINGS Cardiovascular: Left-sided central line which terminates at the high right atrium. Mild cardiomegaly. Right-sided central line terminates at the high SVC. Tortuous thoracic aorta. Pulmonary artery enlargement, 4.0 cm outflow tract. Mediastinum/Nodes: Borderline right paratracheal adenopathy at 10 mm. A node within the azygoesophageal recess measures 9 mm. Hilar regions poorly evaluated without intravenous contrast. Lungs/Pleura: Small right pleural effusion is grossly similar. Tracheostomy. Right lower lobe airspace disease. More patchy posterior left upper and lower lobe airspace opacities. Musculoskeletal: No acute osseous abnormality. CT ABDOMEN PELVIS FINDINGS Hepatobiliary: Markedly limited evaluation of the liver. Lesion described in the central right lobe on 05/20/2016 is not readily apparent. Multiple small gallstones without surrounding  inflammation or biliary duct dilatation. Pancreas: Normal, without mass or ductal dilatation. Spleen: Normal in size, without focal abnormality. Adrenals/Urinary Tract: Normal adrenal glands. No renal calculi or hydronephrosis. 12 mm left renal interpolar exophytic lesion measures greater than fluid density on image 66/ series 3. This is similar in size to on the prior exam. Interpolar right renal lesion measures 2.4 cm and borderline greater than fluid density. Normal urinary bladder. Stomach/Bowel: A nasogastric tube is looped in the stomach, terminating in the gastric body. Catheter in the rectum. Scattered colonic diverticula. Normal small bowel caliber. Vascular/Lymphatic: Normal aortic caliber. 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 The left common iliac is ectatic at 1.9 cm. No gross abdominopelvic adenopathy. Reproductive: Normal prostate. Other: Small bilateral fat containing inguinal hernias. Right sided retroperitoneal complex fluid collection is again identified. This is slightly decreased compared to 05/20/2016. Example 16.7 cm today versus 18.4 cm at the same level. This extends into the pelvis as before. Trace left pericolic gutter fluid is similar.  Anasarca. Musculoskeletal: Bilateral hip osteoarthritis. IMPRESSION: 1. Moderate-to-marked degradation, as detailed above. 2. Similar right pleural effusion with right lower lobe collapse/consolidation which could represent infection or atelectasis. More patchy left-sided airspace opacities are suspicious for infection or aspiration. 3. No explanation for fever within the abdomen or pelvis. 4. Slight decrease in size of a large right-sided retroperitoneal hematoma. 5. Cholelithiasis. 6. Pulmonary artery enlargement suggests pulmonary arterial hypertension. 7. Mild thoracic adenopathy, likely reactive. 8. Bilateral common iliac artery dilatation. 9. Bilateral indeterminate renal lesions. These could be re-evaluated with follow-up pre and post  contrast CT or MRI (as an outpatient) Electronically Signed   By: Abigail Miyamoto M.D.   On: 06/17/2016 21:03   Scheduled Meds: . acyclovir  5 mg/kg (Adjusted) Intravenous Q24H  . ampicillin-sulbactam (UNASYN) IV  3 g Intravenous QHS  . anticoagulant sodium citrate  10 mL Intravenous Once  . budesonide (PULMICORT) nebulizer solution  0.5 mg Nebulization BID  . chlorhexidine gluconate (MEDLINE KIT)  15 mL Mouth Rinse BID  . [START ON 07/15/16] darbepoetin (ARANESP) injection - DIALYSIS  200 mcg Intravenous Q Wed-HD  . feeding supplement (OSMOLITE 1.5 CAL)  1,000 mL Per Tube Q24H  . feeding supplement (PRO-STAT SUGAR FREE 64)  60 mL Per Tube TID  . insulin aspart  0-9 Units Subcutaneous Q4H  . ipratropium-albuterol  3 mL Nebulization BID  . levothyroxine  125 mcg Per Tube QAC breakfast  . mouth rinse  15 mL Mouth Rinse QID  . midodrine  15 mg Per Tube TID WC  . multivitamin  1 tablet Oral QHS  . pantoprazole sodium  40 mg Per Tube Daily   Continuous Infusions: . sodium chloride 10 mL/hr at 06/03/16 2000  . sodium chloride Stopped (06/06/16 0700)     LOS: 42 days   Time spent: 27 min  Irwin Brakeman, MD [facility] Pager [phone]  If 7PM-7AM, please contact night-coverage [email or web address] Password [facility] 06/19/2016, 9:29 AM

## 2016-06-19 NOTE — Progress Notes (Signed)
06/19/2016 4:21 PM Progress Note  I called and spoke with transfer service for [facility].  I spoke with the critical care attending Dr. Tildon Husky who does the triage for stepdown and ICU patients.  He reviewed the chart and clinicals and we discussed the case together and reported that he felt that we were providing excellent care for the patient and he agreed with the care.  He says that they would not be able to provide any treatment there that we are not providing for the patient here at this facility.  He says that they did not have any beds available at this time.  He agreed strongly with goals of care discussions and was just as concerned as we are that moving him would cause him more harm and suffering than would benefit him.  The transfer alone could cause him significant pain and discomfort given that he has a broken humerus and shingles.  He says that if the family insists we could call back on a daily basis for updates and bed availability.    Maryln Manuel, MD [facility]

## 2016-06-19 NOTE — Progress Notes (Signed)
Daily Progress Note   Patient Name: Omar [name]       Date: 06/19/2016 DOB: [birth year]  Age: 71 y.o. MRN#: [id number] Attending Physician: Murlean Iba, MD Primary Care Physician: Redge Gainer, MD Admit Date: 05/20/2016  Reason for Consultation/Follow-up: Establishing goals of care  Subjective: Patient has not spoken since last week.  He writhes in pain as the RN gently tries to care for him.     Dr. Wynetta Emery and I met with the family this evening. His grand daughter Chauncey [place]) and her SO were present.  We discussed his hospitalization, his progressive decline, the unexplained arm fracture and the use of heparin.  His grand daughter spoke about him as a very energetic and active man before the stroke.  Even after the stroke he enjoyed being at home with his family - cared for by his grand children.  Per HCPOA he was admitted with pneumonia and respiratory problems - and now look at him.  We discussed and she is aware of his renal failure with HD dependence, his tracheostomy, his mental decline, and his bacteremia - in addition to the severe pain he is currently in from the shingles, the hematoma and the right arm fracture.  The family understands his BP is too low for HD and he may have to be on CRRT for the 3rd time this admission - further, we are uncertain of the source of his bacteremia and the HD accesses may need to be removed.  The HCPOA is a group Producer, television/film/video with a a degree in social work.  She states she is familiar with LTAC facilities - and does not believe her grandfather would ever want to be in one.  The family commented that he was tired.  He told them last week that he was "ready".  We discussed code status and they selected DNR.  We talked about transfer to [facility] or [facility]  and how painful that would be.  Further, Dr. Wynetta Emery has been in contact with [facility] for the past 2 days and their are no Step Down beds.  When our meeting closed - the family and medical team agreed to take it one day at a time.  Change code status to DNR with full scope treatment.  We will attempt to treat his pain but not at the  expense of stable BP.  We will meet again tomorrow and reassess.   Assessment: Patient appears to be suffering heavily.  He does not open his eyes, and he does not speak, but attempts to pull at his tubes, trach, IVs.  He grimaces when touched.  Multiple organ systems have failed including nephrological, pulmonary, CNS, musculoskeletal.  These systems do not show potential for meaningful recovery.  He is appropriate for comfort measures.     Patient Profile/HPI: 71 y.o. male  with past medical history of Obesity (current weight 340 pounds; albumin 1.7), atrial fib, gout, hypertension, multinodular goiter, osteoarthritis, hyperlipidemia, stroke (wheelchair bound at baseline), thrombocytopenia, vitamin D deficiency admitted from [facility] on  05/06/2016 . Critically ill after testing positive for the flu, pneumonia, with altered mental status. Patient developed a retroperitoneal bleed secondary to heparin. He also developed acute on chronic renal failure and has been on CRRT. He has undergone a trach and has been intermittently on ventilator support. He now has a fractured right arm and is having severe pain from Shingles on his chest.     Length of Stay: 42  Current Medications: Scheduled Meds:  . acyclovir  5 mg/kg (Adjusted) Intravenous Q24H  . amiodarone  400 mg Oral BID  . ampicillin-sulbactam (UNASYN) IV  3 g Intravenous QHS  . anticoagulant sodium citrate  10 mL Intravenous Once  . budesonide (PULMICORT) nebulizer solution  0.5 mg Nebulization BID  . chlorhexidine gluconate (MEDLINE KIT)  15 mL Mouth Rinse BID  . [START ON 07-16-16] darbepoetin (ARANESP) injection -  DIALYSIS  200 mcg Intravenous Q Wed-HD  . feeding supplement (OSMOLITE 1.5 CAL)  1,000 mL Per Tube Q24H  . feeding supplement (PRO-STAT SUGAR FREE 64)  60 mL Per Tube TID  . insulin aspart  0-9 Units Subcutaneous Q4H  . ipratropium-albuterol  3 mL Nebulization BID  . levothyroxine  125 mcg Per Tube QAC breakfast  . mouth rinse  15 mL Mouth Rinse QID  . midodrine  15 mg Per Tube TID WC  . multivitamin  1 tablet Oral QHS  . pantoprazole sodium  40 mg Per Tube Daily  . vancomycin  1,000 mg Intravenous Q M,W,F-HD    Continuous Infusions: . sodium chloride 10 mL/hr at 06/03/16 2000  . sodium chloride Stopped (06/06/16 0700)    PRN Meds: Place/Maintain arterial line **AND** sodium chloride, sodium chloride, sodium chloride, acetaminophen (TYLENOL) oral liquid 160 mg/5 mL, acetaminophen, albuterol, alteplase, docusate, heparin, heparin, ipratropium-albuterol, lidocaine (PF), lidocaine-prilocaine, metoprolol, morphine injection, naLOXone (NARCAN)  injection, pentafluoroprop-tetrafluoroeth, RESOURCE THICKENUP CLEAR, sodium chloride flush  Physical Exam       Well developed male,  Eyes Closed, tremering in bed, moving his head from side to side CV tachycardic.  Shingles across anterior chest. resp no distress, n/g in place with feeds at 40 ml/hou abd soft, nt, nd Lower ext in prevlon boots.   Vital Signs: BP 98/65   Pulse (!) 121   Temp 99 F (37.2 C) (Oral)   Resp (!) 24   Ht '5\' 6"'  (1.676 m)   Wt (!) 154.2 kg (340 lb) Comment: I feel this weight may be inaccurate  SpO2 96%   BMI 54.88 kg/m  SpO2: SpO2: 96 % O2 Device: O2 Device: Tracheostomy Collar O2 Flow Rate: O2 Flow Rate (L/min): 12 L/min  Intake/output summary:  Intake/Output Summary (Last 24 hours) at 06/19/16 1552 Last data filed at 06/19/16 1500  Gross per 24 hour  Intake  1080 ml  Output              450 ml  Net              630 ml   LBM: Last BM Date: 06/17/16 Baseline Weight: Weight: (!) 170.1 kg (375  lb) Most recent weight: Weight:  (Possible problem with bed scale?)       Palliative Assessment/Data:    Flowsheet Rows     Most Recent Value  Intake Tab  Referral Department  Hospitalist  Unit at Time of Referral  ICU  Palliative Care Primary Diagnosis  Sepsis/Infectious Disease  Date Notified  06/09/16  Palliative Care Type  New Palliative care  Reason for referral  Pain, Non-pain Symptom, Clarify Goals of Care  Date of Admission  05/30/2016  Date first seen by Palliative Care  06/09/16  # of days Palliative referral response time  0 Day(s)  # of days IP prior to Palliative referral  1  Clinical Assessment  Palliative Performance Scale Score  10%  Pain Max last 24 hours  8  Pain Min Last 24 hours  5  Dyspnea Max Last 24 Hours  Not able to report  Dyspnea Min Last 24 hours  Not able to report  Nausea Max Last 24 Hours  Not able to report  Nausea Min Last 24 Hours  Not able to report  Anxiety Max Last 24 Hours  Not able to report  Anxiety Min Last 24 Hours  Not able to report  Other Max Last 24 Hours  Not able to report  Psychosocial & Spiritual Assessment  Palliative Care Outcomes  Patient/Family meeting held?  Yes  Who was at the meeting?  grand daughter (medical decision maker), and daughter  Palliative Care Outcomes  Clarified goals of care  Palliative Care follow-up planned  Yes, Facility      Patient Active Problem List   Diagnosis Date Noted  . Dysphagia   . Encounter for orogastric (OG) tube placement   . Encounter for orogastric tube placement   . Arm pain   . Pleural effusion   . Abdominal pain   . FUO (fever of unknown origin)   . Fracture of proximal humerus   . Pressure injury of skin 06/11/2016  . Tracheostomy care ([place])   . Stage 5 chronic kidney disease on chronic dialysis ([place])   . HCAP (healthcare-associated pneumonia)   . Palliative care by specialist   . Palliative care encounter   . Acute respiratory failure with hypercapnia ([place])   . AKI  (acute kidney injury) ([place])   . Hepatic lesion 05/22/2016  . Shock liver 05/17/2016  . Current use of proton pump inhibitor 05/17/2016  . Contraindication to deep vein thrombosis (DVT) prophylaxis 05/17/2016  . Retroperitoneal hemorrhage 05/15/2016  . Goals of care, counseling/discussion 05/15/2016  . On enteral nutrition 05/15/2016  . Acute renal failure (ARF) ([place]) 05/15/2016  . Electrolyte imbalance 05/15/2016  . Mental status change   . Acute congestive heart failure ([place])   . Shock circulatory ([place])   . Acute respiratory failure with hypoxemia ([place]) 05/30/2016  . Thrombocytopenia ([place]) 05/04/2014  . 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 Hemiparesis affecting right side as late effect of cerebrovascular accident ([place]) 09/11/2013  . Hypothyroidism 09/11/2013  . Cerebrovascular accident with involvement of right side of body ([place]) 04/17/2013  . Allergic rhinitis 06/06/2012  . Gout 06/06/2012  . Osteoarthrosis and allied disorders   . Hyperlipidemia   .  Vitamin D deficiency   . Hypertension   . Asthma   . Paroxysmal atrial fibrillation ([place]) 03/10/2009  . OBESITY, MORBID 03/02/2009    Palliative Care Plan    Recommendations/Plan:  Changed code status to DNR but with full scope treatment.  Will add low dose fentanyl for pain with hold parameters  Per Dr. Wynetta Emery - no PICC for now.     Will attempt to meet with patient's daughter 4/17 to explain situation and recommend comfort measures.    Code Status:  DNR  Prognosis:   < 2 weeks   Discharge Planning:  Anticipated Hospital Death  Care plan was discussed with bedside RN, Dr. Wynetta Emery, Nephrology, patient's family.  Thank you for allowing the Palliative Medicine Team to assist in the care of this patient.  Total time spent:  80 min.     Greater than 50%  of this time was spent counseling and coordinating care related to the above assessment and plan.  [name], PA-C Palliative  Medicine  Please contact Palliative MedicineTeam phone at [phone] for questions and concerns between 7 am - 7 pm.   Please see AMION for individual provider pager numbers.

## 2016-06-19 NOTE — Progress Notes (Signed)
Physical Therapy Treatment Patient Details Name: Omar [name] MRN: [id number] DOB: Jun 28, 1945 Today's Date: 06/19/2016    History of Present Illness 71 yo admitted with respiratory failure, AMS, aspiration, sepsis, retroperitoneal bleed. ETT3/5-3/8, 3/10-3/21, trach 3/21, 3/27 pt found to have proximal humerus fx without precipitating event. PMHx: CVA with Right hemiplegia, Asthma, Afib, gout, HTN, obesity, OA    PT Comments    Patient not arousable during session. Kept eyes closed. Not following any commands to participate in there ex. PROM performed on all joints in accessible range. Grimacing in pain with all mobility esp of BLEs and RUE. Pt with tachycardia with all mobility with HR up to 136 bpm. Will continue to follow and reassess as appropriate.   Follow Up Recommendations  LTACH;Supervision/Assistance - 24 hour     Equipment Recommendations  None recommended by PT    Recommendations for Other Services       Precautions / Restrictions Precautions Precautions: Fall Precaution Comments: panda, trach, flexiseal, right hemiplegia Required Braces or Orthoses: Sling Restrictions Weight Bearing Restrictions: Yes RUE Weight Bearing: Non weight bearing RLE Weight Bearing: Non weight bearing    Mobility  Bed Mobility Overal bed mobility: Needs Assistance             General bed mobility comments: Requires total A of 2 to reposition in bed and scoot up in bed. Not following any commands.  Transfers                    Ambulation/Gait                 Stairs            Wheelchair Mobility    Modified Rankin (Stroke Patients Only)       Balance                                            Cognition Arousal/Alertness:  (somnolent) Behavior During Therapy: Flat affect Overall Cognitive Status: Difficult to assess                         Following Commands:  (not following any commands.)       General  Comments: Pt with eyes closed for all of session. Not interacting with therapists. Not able to arouse despite max multimodal cues. Grimaces to all movement BLEs and RUE. Not following commands.      Exercises General Exercises - Upper Extremity Wrist Flexion: Right;10 reps;Supine;PROM Wrist Extension: Right;Supine;PROM;10 reps General Exercises - Lower Extremity Ankle Circles/Pumps: PROM;10 reps;Supine Heel Slides: Both;5 reps;Supine (Knee flexion PROM limited by pain) Hip ABduction/ADduction: PROM;Both;5 reps Hip Flexion/Marching: PROM;Both;5 reps (limited range)    General Comments        Pertinent Vitals/Pain Pain Assessment: Faces Faces Pain Scale: Hurts even more Pain Location: grimacing with all PROM BLEs and RUE Pain Descriptors / Indicators: Guarding;Grimacing Pain Intervention(s): Limited activity within patient's tolerance;Monitored during session    Home Living                      Prior Function            PT Goals (current goals can now be found in the care plan section) Progress towards PT goals: Not progressing toward goals - comment (secondary to decreased level of arousal.)    Frequency  Min 2X/week      PT Plan Current plan remains appropriate    Co-evaluation             End of Session Equipment Utilized During Treatment: Oxygen (trach collar) Activity Tolerance: Patient limited by lethargy Patient left: in bed;with call bell/phone within reach (PRAFO boots donned) Nurse Communication: Mobility status;Need for lift equipment PT Visit Diagnosis: Muscle weakness (generalized) (M62.81);Hemiplegia and hemiparesis;Other abnormalities of gait and mobility (R26.89) Hemiplegia - Right/Left: Right Hemiplegia - dominant/non-dominant: Non-dominant Hemiplegia - caused by: Other cerebrovascular disease Pain - Right/Left: Right Pain - part of body: Shoulder;Leg;Ankle and joints of foot     Time: 1009-1030 PT Time Calculation (min)  (ACUTE ONLY): 21 min  Charges:  $Therapeutic Exercise: 8-22 mins                    G Codes:       Omar [name], PT, DPT [phone]     Omar [name] 06/19/2016, 10:37 AM

## 2016-06-19 NOTE — Progress Notes (Addendum)
Applied xeroform a gauze and foam to shingles wound on right/midline chest and midline upper back

## 2016-06-19 NOTE — Progress Notes (Signed)
  Speech Language Pathology Treatment: [name] Speaking valve  Patient Details Name: Omar [name] MRN: [id number] DOB: 18-Aug-1945 Today's Date: 06/19/2016 Time: 1308-6578 SLP Time Calculation (min) (ACUTE ONLY): 8 min  Assessment / Plan / Recommendation Clinical Impression  Mentation very poor today, worsened since last week. Pt did not open eye or focus attentiont o speaker or environment; he is restless, pulling at clothing. SLP attempted to facilitate expectoration of audbiel secretions with placement of PMSV with verbal cues and oral suction with no result. Pt tolerated PMSV placement without change in vital signs, but placement did not improve pts awareness, arousal or ability to manage secretions. Did not attempt PO trials as mentation was not appropriate. Though diet is in place, pt has not been accepting PO and is fully reliant on alternate nutrition. Mentation will need to improve to make any progress. Will follow for readiness.   HPI HPI: 71 y/o male from [facility] admitted 3/5 with altered mental status with aspiration, sepsis, hypoxic/hypercapnic respiratory failure. Course c/b retroperitoneal bleed, shock, acute renal failure & prolonged ventilation requiring tracheostomy.  ETT 3/05-3/08; 3/10 - trach 3/21. CRRT.      SLP Plan  Continue with current plan of care       Recommendations  Diet recommendations: NPO      Patient may use Passy-Muir Speech Valve: Intermittently with supervision;During all therapies with supervision PMSV Supervision: Full MD: Please consider changing trach tube to : Smaller size;Cuffless         Plan: Continue with current plan of care       GO               [facility], MA CCC-SLP [phone]  Claudine Mouton 06/19/2016, 2:50 PM

## 2016-06-19 NOTE — Progress Notes (Signed)
Patient less agitated overnight.  He seems to have rested well, and blood pressure increased with bolus.

## 2016-06-19 NOTE — Progress Notes (Signed)
[facility] Progress Note    Assessment/ Plan:   1 AKI oliguric. S/P LIJ TDC, R IJ trialysis catheter still in place.   2 Resp insuff with trach  3 Massive obesity 4 CVA 5 Arm fx 6 RP bleed 8 Anemia esa,  10 afib, low BP 11 Nutrition needs PEG 12 HZV- on airborne 13 G pos C in clusters, bacteremia 14 dispo: needs GOC discussion  Subjective:    Looks ill this AM, tachycardic, relatively hypotensive, still has 2 dialysis catheters in. Goals of care meeting needed.   Objective:   BP 96/65 (BP Location: Left Arm)   Pulse (!) 121   Temp 99.4 F (37.4 C) (Axillary)   Resp (!) 25   Ht '5\' 6"'  (1.676 m)   Wt (!) 154.2 kg (340 lb) Comment: I feel this weight may be inaccurate  SpO2 98%   BMI 54.88 kg/m   Intake/Output Summary (Last 24 hours) at 06/19/16 0936 Last data filed at 06/18/16 1600  Gross per 24 hour  Intake              430 ml  Output                0 ml  Net              430 ml   Weight change:   Physical Exam: Gen: ill appearin TXM:[id number], irregular Resp: tachypneic, trach in place, some secretions (foamy) GNO:IBBCW Ext: edema  Imaging: Ct Abdomen Pelvis Wo Contrast  Result Date: 06/17/2016 CLINICAL DATA:  Fever of unknown origin. Morbid obesity. Thrombocytopenia. EXAM: CT CHEST, ABDOMEN AND PELVIS WITHOUT CONTRAST TECHNIQUE: Multidetector CT imaging of the chest, abdomen and pelvis was performed following the standard protocol without IV contrast. COMPARISON:  Abdominopelvic CT of 06/06/2016. Chest radiograph 06/17/2016. No comparison chest CT. FINDINGS: Multifactorial degradation, including patient morbid obesity, arms not raised above the head, lack of IV contrast, and mild motion. CT CHEST FINDINGS Cardiovascular: Left-sided central line which terminates at the high right atrium. Mild cardiomegaly. Right-sided central line terminates at the high SVC. Tortuous thoracic aorta. Pulmonary artery enlargement, 4.0 cm outflow tract.  Mediastinum/Nodes: Borderline right paratracheal adenopathy at 10 mm. A node within the azygoesophageal recess measures 9 mm. Hilar regions poorly evaluated without intravenous contrast. Lungs/Pleura: Small right pleural effusion is grossly similar. Tracheostomy. Right lower lobe airspace disease. More patchy posterior left upper and lower lobe airspace opacities. Musculoskeletal: No acute osseous abnormality. CT ABDOMEN PELVIS FINDINGS Hepatobiliary: Markedly limited evaluation of the liver. Lesion described in the central right lobe on 05/20/2016 is not readily apparent. Multiple small gallstones without surrounding inflammation or biliary duct dilatation. Pancreas: Normal, without mass or ductal dilatation. Spleen: Normal in size, without focal abnormality. Adrenals/Urinary Tract: Normal adrenal glands. No renal calculi or hydronephrosis. 12 mm left renal interpolar exophytic lesion measures greater than fluid density on image 66/ series 3. This is similar in size to on the prior exam. Interpolar right renal lesion measures 2.4 cm and borderline greater than fluid density. Normal urinary bladder. Stomach/Bowel: A nasogastric tube is looped in the stomach, terminating in the gastric body. Catheter in the rectum. Scattered colonic diverticula. Normal small bowel caliber. Vascular/Lymphatic: Normal aortic caliber. 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 The left common iliac is ectatic at 1.9 cm. No gross abdominopelvic adenopathy. Reproductive: Normal prostate. Other: Small bilateral fat containing inguinal hernias. Right sided retroperitoneal complex fluid collection is again identified. This is slightly decreased compared to 05/20/2016.  Example 16.7 cm today versus 18.4 cm at the same level. This extends into the pelvis as before. Trace left pericolic gutter fluid is similar.  Anasarca. Musculoskeletal: Bilateral hip osteoarthritis. IMPRESSION: 1. Moderate-to-marked degradation, as detailed above. 2.  Similar right pleural effusion with right lower lobe collapse/consolidation which could represent infection or atelectasis. More patchy left-sided airspace opacities are suspicious for infection or aspiration. 3. No explanation for fever within the abdomen or pelvis. 4. Slight decrease in size of a large right-sided retroperitoneal hematoma. 5. Cholelithiasis. 6. Pulmonary artery enlargement suggests pulmonary arterial hypertension. 7. Mild thoracic adenopathy, likely reactive. 8. Bilateral common iliac artery dilatation. 9. Bilateral indeterminate renal lesions. These could be re-evaluated with follow-up pre and post contrast CT or MRI (as an outpatient) Electronically Signed   By: Abigail Miyamoto M.D.   On: 06/17/2016 21:03   Ct Head Wo Contrast  Result Date: 06/18/2016 CLINICAL DATA:  Mental status change.  Inpatient. EXAM: CT HEAD WITHOUT CONTRAST TECHNIQUE: Contiguous axial images were obtained from the base of the skull through the vertex without intravenous contrast. COMPARISON:  06/04/2016 head CT. FINDINGS: Motion degraded scan. Brain: No evidence of parenchymal hemorrhage or extra-axial fluid collection. No mass lesion, mass effect, or midline shift. No CT evidence of acute infarction. Generalized cerebral volume loss. Nonspecific mild to moderate subcortical and periventricular white matter hypodensity, most in keeping with chronic small vessel ischemic change. No ventriculomegaly. Vascular: Intracranial atherosclerosis.  No acute abnormality. Skull: No evidence of calvarial fracture. Sinuses/Orbits: Mucoperiosteal thickening in the bilateral ethmoidal air cells, right frontal sinus and sphenoid sinuses. No fluid levels. Other: Partial bilateral mastoid effusions, left greater than right. Right nasal route tube enters the hypopharynx on the scout tomograms. IMPRESSION: 1.  No evidence of acute intracranial abnormality. 2. Generalized cerebral volume loss and chronic small vessel ischemia. 3. Paranasal  sinusitis. 4. Bilateral partial mastoid effusions. Electronically Signed   By: Ilona Sorrel M.D.   On: 06/18/2016 23:16   Ct Chest Wo Contrast  Result Date: 06/17/2016 CLINICAL DATA:  Fever of unknown origin. Morbid obesity. Thrombocytopenia. EXAM: CT CHEST, ABDOMEN AND PELVIS WITHOUT CONTRAST TECHNIQUE: Multidetector CT imaging of the chest, abdomen and pelvis was performed following the standard protocol without IV contrast. COMPARISON:  Abdominopelvic CT of 06/06/2016. Chest radiograph 06/17/2016. No comparison chest CT. FINDINGS: Multifactorial degradation, including patient morbid obesity, arms not raised above the head, lack of IV contrast, and mild motion. CT CHEST FINDINGS Cardiovascular: Left-sided central line which terminates at the high right atrium. Mild cardiomegaly. Right-sided central line terminates at the high SVC. Tortuous thoracic aorta. Pulmonary artery enlargement, 4.0 cm outflow tract. Mediastinum/Nodes: Borderline right paratracheal adenopathy at 10 mm. A node within the azygoesophageal recess measures 9 mm. Hilar regions poorly evaluated without intravenous contrast. Lungs/Pleura: Small right pleural effusion is grossly similar. Tracheostomy. Right lower lobe airspace disease. More patchy posterior left upper and lower lobe airspace opacities. Musculoskeletal: No acute osseous abnormality. CT ABDOMEN PELVIS FINDINGS Hepatobiliary: Markedly limited evaluation of the liver. Lesion described in the central right lobe on 05/20/2016 is not readily apparent. Multiple small gallstones without surrounding inflammation or biliary duct dilatation. Pancreas: Normal, without mass or ductal dilatation. Spleen: Normal in size, without focal abnormality. Adrenals/Urinary Tract: Normal adrenal glands. No renal calculi or hydronephrosis. 12 mm left renal interpolar exophytic lesion measures greater than fluid density on image 66/ series 3. This is similar in size to on the prior exam. Interpolar right  renal lesion measures 2.4 cm and borderline greater than  fluid density. Normal urinary bladder. Stomach/Bowel: A nasogastric tube is looped in the stomach, terminating in the gastric body. Catheter in the rectum. Scattered colonic diverticula. Normal small bowel caliber. Vascular/Lymphatic: Normal aortic caliber. 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 The left common iliac is ectatic at 1.9 cm. No gross abdominopelvic adenopathy. Reproductive: Normal prostate. Other: Small bilateral fat containing inguinal hernias. Right sided retroperitoneal complex fluid collection is again identified. This is slightly decreased compared to 05/20/2016. Example 16.7 cm today versus 18.4 cm at the same level. This extends into the pelvis as before. Trace left pericolic gutter fluid is similar.  Anasarca. Musculoskeletal: Bilateral hip osteoarthritis. IMPRESSION: 1. Moderate-to-marked degradation, as detailed above. 2. Similar right pleural effusion with right lower lobe collapse/consolidation which could represent infection or atelectasis. More patchy left-sided airspace opacities are suspicious for infection or aspiration. 3. No explanation for fever within the abdomen or pelvis. 4. Slight decrease in size of a large right-sided retroperitoneal hematoma. 5. Cholelithiasis. 6. Pulmonary artery enlargement suggests pulmonary arterial hypertension. 7. Mild thoracic adenopathy, likely reactive. 8. Bilateral common iliac artery dilatation. 9. Bilateral indeterminate renal lesions. These could be re-evaluated with follow-up pre and post contrast CT or MRI (as an outpatient) Electronically Signed   By: Abigail Miyamoto M.D.   On: 06/17/2016 21:03    Labs: BMET  Recent Labs Lab 06/15/16 0255 06/16/16 0311 06/17/16 0500 06/17/16 2304 06/18/16 0839 06/18/16 0930 06/18/16 2030 06/19/16 0548  NA 132* 130* 132* 130* 130* 130* 132* 132*  K 4.2 4.8 3.8 4.0 4.0 4.1 4.1 4.3  CL 100* 98* 98* 96* 95* 96* 95* 94*  CO2 '24 25 30  28 28 28 27 27  ' GLUCOSE 106* 102* 115* 110* 101* 104* 101* 102*  BUN 45* 65* 41* 57* 77* 79* 84* 93*  CREATININE 3.56* 4.83* 3.61* 4.56* 5.00* 5.19* 5.74* 6.11*  CALCIUM 7.7* 7.9* 7.9* 8.1* 8.2* 8.1* 8.4* 8.3*  PHOS 5.3* 5.9* 6.8* 5.6* 7.3*  --  6.9* 9.0*   CBC  Recent Labs Lab 06/17/16 2304 06/18/16 0930 06/18/16 2030 06/19/16 0548  WBC 3.9* 4.3 3.5* 4.3  NEUTROABS 0.2*  --   --   --   HGB 7.7* 8.4* 8.6* 8.3*  HCT 24.2* 27.0* 27.4* 27.2*  MCV 111.0* 111.1* 110.9* 111.5*  PLT 225 263 290 300    Medications:    . acyclovir  5 mg/kg (Adjusted) Intravenous Q24H  . ampicillin-sulbactam (UNASYN) IV  3 g Intravenous QHS  . anticoagulant sodium citrate  10 mL Intravenous Once  . budesonide (PULMICORT) nebulizer solution  0.5 mg Nebulization BID  . chlorhexidine gluconate (MEDLINE KIT)  15 mL Mouth Rinse BID  . [START ON 06-30-16] darbepoetin (ARANESP) injection - DIALYSIS  200 mcg Intravenous Q Wed-HD  . feeding supplement (OSMOLITE 1.5 CAL)  1,000 mL Per Tube Q24H  . feeding supplement (PRO-STAT SUGAR FREE 64)  60 mL Per Tube TID  . insulin aspart  0-9 Units Subcutaneous Q4H  . ipratropium-albuterol  3 mL Nebulization BID  . levothyroxine  125 mcg Per Tube QAC breakfast  . mouth rinse  15 mL Mouth Rinse QID  . midodrine  15 mg Per Tube TID WC  . multivitamin  1 tablet Oral QHS  . pantoprazole sodium  40 mg Per Tube Daily      Madelon Lips, MD 06/19/2016, 9:36 AM

## 2016-06-19 NOTE — Progress Notes (Addendum)
Pharmacy Antibiotic Note  Omar [name] is a 71 y.o. male admitted on 05/22/2016 with AMS. Now s/p multiple abx courses but has new fever and zoster rash. Pharmacy has been consulted for unasyn (concern of aspiration PNA) vancomycin and acyclovir dosing. Acyclovir PO discontinued 4/14 and to change to IV. Noted patient with new ESRD with last HD 4/13- 4.5 hours at BFR 400. Orders for HD again today. WBC normal and Tmax 101.2. Total body weight= 154kg, however acyclovir dosed using an adjusted weight of 100kg.   Plan to order vancomycin doses as needed due to likely HD catheter removal today. Of note, patient has two HD catheters, one of which is tunneled. Unknown if the secondary line will be removed 2/2 to possible bacteremia.   Estimated Vancomycin level currently ~20 mcg/mL. If patient does not receive HD today, he will not need dose of vancomycin.   Plan: -Acyclovir  IV q24h  -Unasyn 3gm IV q24h -Vancomycin 1000 mg IV with HD MWF x1 -Will re-dose vancomycin as needed pending HD plan  -Will follow renal function, cultures and clinical progress -Vancomycin levels as needed   Height:  (167.6 cm) Weight:  (Possible problem with bed scale?) IBW/kg (Calculated) : 63.8  Temp (24hrs), Avg:99.7 F (37.6 C), Min:98.4 F (36.9 C), Max:101.2 F (38.4 C)   Recent Labs Lab 06/16/16 0830 06/17/16 0500 06/17/16 2304 06/18/16 0839 06/18/16 0930 06/18/16 2030 06/19/16 0548  WBC  --  4.5 3.9*  --  4.3 3.5* 4.3  CREATININE  --  3.61* 4.56* 5.00* 5.19* 5.74* 6.11*  VANCORANDOM 28  --   --   --   --   --   --     Estimated Creatinine Clearance: 15.5 mL/min (A) (by C-G formula based on SCr of 6.11 mg/dL (H)).    Allergies  Allergen Reactions  . Anacin Af [Acetaminophen]   . Heparin     Retroperitoneal bleed in 2018, previous bleed from heparin in 2013  . Niaspan [Niacin Er]     Antimicrobials this admission: Zosyn 3/6 >> 3/8 CTX 3/8 >> 3/14 Cefepime 3/23 >> 4/1 Vancomycin  3/23 >> 3/28, 4/1 > 4/4; 4/10 > 4/15, 4/16 >  Meropenem 4/1 >> 4/8 Ceftazidime 4/10 >> 4/15 Acyclovir po 4/12 (changed to IV 4/15)>>  Dose adjustments this admission:  3/28 VR = 37   4/3 VR = 23   4/13 VR= 28  Microbiology results: 3/6 MRSA PCR >> negative 3/6 Resp: H. Influenzae, beta lactamase positive 4/1 Blood: ngtd 4/8 MRSA swab: negative  4/10 TA Cx: few GPC in pairs and clusters and rare GNRs ; normal respiratory flora 4/10 BCx: NGTD 4/10 UCx: 100,000 colonies of yeast  4/14 GPC/clusters, coag neg staph species  4/14 BCID: staph species and MecA 4/16 CDiff: pending   York Cerise, PharmD Pharmacy Resident  Pager [phone] 06/19/16 12:23 PM

## 2016-06-19 NOTE — Progress Notes (Signed)
Per verbal from MD Signe Colt, ok to place PICC line.

## 2016-06-19 NOTE — Progress Notes (Signed)
Progress Note  Patient Name: Omar [name] Date of Encounter: 06/19/2016  Primary Cardiologist: Dr Percival Spanish  Subjective   Does not respond to verbal stimuli  Inpatient Medications    Scheduled Meds: . acyclovir  5 mg/kg (Adjusted) Intravenous Q24H  . ampicillin-sulbactam (UNASYN) IV  3 g Intravenous QHS  . anticoagulant sodium citrate  10 mL Intravenous Once  . budesonide (PULMICORT) nebulizer solution  0.5 mg Nebulization BID  . chlorhexidine gluconate (MEDLINE KIT)  15 mL Mouth Rinse BID  . [START ON 07/11/2016] darbepoetin (ARANESP) injection - DIALYSIS  200 mcg Intravenous Q Wed-HD  . feeding supplement (OSMOLITE 1.5 CAL)  1,000 mL Per Tube Q24H  . feeding supplement (PRO-STAT SUGAR FREE 64)  60 mL Per Tube TID  . insulin aspart  0-9 Units Subcutaneous Q4H  . ipratropium-albuterol  3 mL Nebulization BID  . levothyroxine  125 mcg Per Tube QAC breakfast  . mouth rinse  15 mL Mouth Rinse QID  . midodrine  15 mg Per Tube TID WC  . multivitamin  1 tablet Oral QHS  . pantoprazole sodium  40 mg Per Tube Daily   Continuous Infusions: . sodium chloride 10 mL/hr at 06/03/16 2000  . sodium chloride Stopped (06/06/16 0700)   PRN Meds: Place/Maintain arterial line **AND** sodium chloride, sodium chloride, sodium chloride, acetaminophen (TYLENOL) oral liquid 160 mg/5 mL, acetaminophen, albuterol, alteplase, docusate, heparin, heparin, ipratropium-albuterol, lidocaine (PF), lidocaine-prilocaine, metoprolol, morphine injection, naLOXone (NARCAN)  injection, pentafluoroprop-tetrafluoroeth, RESOURCE THICKENUP CLEAR, sodium chloride flush   Vital Signs    Vitals:   06/19/16 0330 06/19/16 0400 06/19/16 0514 06/19/16 0910  BP:  (!) 101/59 (!) 92/54 96/65  Pulse: (!) 130 (!) 133 (!) 120 (!) 121  Resp: 20 (!) 34 19 (!) 25  Temp:   (!) 101.2 F (38.4 C) 99.4 F (37.4 C)  TempSrc:   Oral Axillary  SpO2: 92% 95% 94% 98%  Height:        Intake/Output Summary (Last 24 hours) at  06/19/16 0953 Last data filed at 06/18/16 1600  Gross per 24 hour  Intake              430 ml  Output                0 ml  Net              430 ml   Filed Weights    Telemetry    Atrial fibrillation with RVR- Personally Reviewed   Physical Exam   GEN: Morbidly obese, does not respond to verbal stimuli Neck: S/P tracheostomy Cardiac: irregular and tachycardic Respiratory: Rhonchi GI: Soft, non-distended, abdominal wall edema Neuro:  Does not respond to verbal stimuli  Labs    Chemistry Recent Labs Lab 06/18/16 0930 06/18/16 2030 06/19/16 0548  NA 130* 132* 132*  K 4.1 4.1 4.3  CL 96* 95* 94*  CO2 '28 27 27  ' GLUCOSE 104* 101* 102*  BUN 79* 84* 93*  CREATININE 5.19* 5.74* 6.11*  CALCIUM 8.1* 8.4* 8.3*  PROT 7.1  --   --   ALBUMIN 1.9* 1.9* 1.9*  AST 25  --   --   ALT 7*  --   --   ALKPHOS 84  --   --   BILITOT 1.3*  --   --   GFRNONAA 10* 9* 8*  GFRAA 12* 10* 9*  ANIONGAP '6 10 11     ' Hematology Recent Labs Lab 06/18/16 0930 06/18/16 2030 06/19/16 0548  WBC 4.3 3.5* 4.3  RBC 2.43* 2.47* 2.44*  HGB 8.4* 8.6* 8.3*  HCT 27.0* 27.4* 27.2*  MCV 111.1* 110.9* 111.5*  MCH 34.6* 34.8* 34.0  MCHC 31.1 31.4 30.5  RDW 21.0* 20.9* 20.8*  PLT 263 290 300    Radiology    Ct Abdomen Pelvis Wo Contrast  Result Date: 06/17/2016 CLINICAL DATA:  Fever of unknown origin. Morbid obesity. Thrombocytopenia. EXAM: CT CHEST, ABDOMEN AND PELVIS WITHOUT CONTRAST TECHNIQUE: Multidetector CT imaging of the chest, abdomen and pelvis was performed following the standard protocol without IV contrast. COMPARISON:  Abdominopelvic CT of 06/06/2016. Chest radiograph 06/17/2016. No comparison chest CT. FINDINGS: Multifactorial degradation, including patient morbid obesity, arms not raised above the head, lack of IV contrast, and mild motion. CT CHEST FINDINGS Cardiovascular: Left-sided central line which terminates at the high right atrium. Mild cardiomegaly. Right-sided central line  terminates at the high SVC. Tortuous thoracic aorta. Pulmonary artery enlargement, 4.0 cm outflow tract. Mediastinum/Nodes: Borderline right paratracheal adenopathy at 10 mm. A node within the azygoesophageal recess measures 9 mm. Hilar regions poorly evaluated without intravenous contrast. Lungs/Pleura: Small right pleural effusion is grossly similar. Tracheostomy. Right lower lobe airspace disease. More patchy posterior left upper and lower lobe airspace opacities. Musculoskeletal: No acute osseous abnormality. CT ABDOMEN PELVIS FINDINGS Hepatobiliary: Markedly limited evaluation of the liver. Lesion described in the central right lobe on 05/20/2016 is not readily apparent. Multiple small gallstones without surrounding inflammation or biliary duct dilatation. Pancreas: Normal, without mass or ductal dilatation. Spleen: Normal in size, without focal abnormality. Adrenals/Urinary Tract: Normal adrenal glands. No renal calculi or hydronephrosis. 12 mm left renal interpolar exophytic lesion measures greater than fluid density on image 66/ series 3. This is similar in size to on the prior exam. Interpolar right renal lesion measures 2.4 cm and borderline greater than fluid density. Normal urinary bladder. Stomach/Bowel: A nasogastric tube is looped in the stomach, terminating in the gastric body. Catheter in the rectum. Scattered colonic diverticula. Normal small bowel caliber. Vascular/Lymphatic: Normal aortic caliber. 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 The left common iliac is ectatic at 1.9 cm. No gross abdominopelvic adenopathy. Reproductive: Normal prostate. Other: Small bilateral fat containing inguinal hernias. Right sided retroperitoneal complex fluid collection is again identified. This is slightly decreased compared to 05/20/2016. Example 16.7 cm today versus 18.4 cm at the same level. This extends into the pelvis as before. Trace left pericolic gutter fluid is similar.  Anasarca.  Musculoskeletal: Bilateral hip osteoarthritis. IMPRESSION: 1. Moderate-to-marked degradation, as detailed above. 2. Similar right pleural effusion with right lower lobe collapse/consolidation which could represent infection or atelectasis. More patchy left-sided airspace opacities are suspicious for infection or aspiration. 3. No explanation for fever within the abdomen or pelvis. 4. Slight decrease in size of a large right-sided retroperitoneal hematoma. 5. Cholelithiasis. 6. Pulmonary artery enlargement suggests pulmonary arterial hypertension. 7. Mild thoracic adenopathy, likely reactive. 8. Bilateral common iliac artery dilatation. 9. Bilateral indeterminate renal lesions. These could be re-evaluated with follow-up pre and post contrast CT or MRI (as an outpatient) Electronically Signed   By: Abigail Miyamoto M.D.   On: 06/17/2016 21:03   Ct Head Wo Contrast  Result Date: 06/18/2016 CLINICAL DATA:  Mental status change.  Inpatient. EXAM: CT HEAD WITHOUT CONTRAST TECHNIQUE: Contiguous axial images were obtained from the base of the skull through the vertex without intravenous contrast. COMPARISON:  06/04/2016 head CT. FINDINGS: Motion degraded scan. Brain: No evidence of parenchymal hemorrhage or extra-axial fluid collection.  No mass lesion, mass effect, or midline shift. No CT evidence of acute infarction. Generalized cerebral volume loss. Nonspecific mild to moderate subcortical and periventricular white matter hypodensity, most in keeping with chronic small vessel ischemic change. No ventriculomegaly. Vascular: Intracranial atherosclerosis.  No acute abnormality. Skull: No evidence of calvarial fracture. Sinuses/Orbits: Mucoperiosteal thickening in the bilateral ethmoidal air cells, right frontal sinus and sphenoid sinuses. No fluid levels. Other: Partial bilateral mastoid effusions, left greater than right. Right nasal route tube enters the hypopharynx on the scout tomograms. IMPRESSION: 1.  No evidence of  acute intracranial abnormality. 2. Generalized cerebral volume loss and chronic small vessel ischemia. 3. Paranasal sinusitis. 4. Bilateral partial mastoid effusions. Electronically Signed   By: Ilona Sorrel M.D.   On: 06/18/2016 23:16   Ct Chest Wo Contrast  Result Date: 06/17/2016 CLINICAL DATA:  Fever of unknown origin. Morbid obesity. Thrombocytopenia. EXAM: CT CHEST, ABDOMEN AND PELVIS WITHOUT CONTRAST TECHNIQUE: Multidetector CT imaging of the chest, abdomen and pelvis was performed following the standard protocol without IV contrast. COMPARISON:  Abdominopelvic CT of 06/06/2016. Chest radiograph 06/17/2016. No comparison chest CT. FINDINGS: Multifactorial degradation, including patient morbid obesity, arms not raised above the head, lack of IV contrast, and mild motion. CT CHEST FINDINGS Cardiovascular: Left-sided central line which terminates at the high right atrium. Mild cardiomegaly. Right-sided central line terminates at the high SVC. Tortuous thoracic aorta. Pulmonary artery enlargement, 4.0 cm outflow tract. Mediastinum/Nodes: Borderline right paratracheal adenopathy at 10 mm. A node within the azygoesophageal recess measures 9 mm. Hilar regions poorly evaluated without intravenous contrast. Lungs/Pleura: Small right pleural effusion is grossly similar. Tracheostomy. Right lower lobe airspace disease. More patchy posterior left upper and lower lobe airspace opacities. Musculoskeletal: No acute osseous abnormality. CT ABDOMEN PELVIS FINDINGS Hepatobiliary: Markedly limited evaluation of the liver. Lesion described in the central right lobe on 05/20/2016 is not readily apparent. Multiple small gallstones without surrounding inflammation or biliary duct dilatation. Pancreas: Normal, without mass or ductal dilatation. Spleen: Normal in size, without focal abnormality. Adrenals/Urinary Tract: Normal adrenal glands. No renal calculi or hydronephrosis. 12 mm left renal interpolar exophytic lesion measures  greater than fluid density on image 66/ series 3. This is similar in size to on the prior exam. Interpolar right renal lesion measures 2.4 cm and borderline greater than fluid density. Normal urinary bladder. Stomach/Bowel: A nasogastric tube is looped in the stomach, terminating in the gastric body. Catheter in the rectum. Scattered colonic diverticula. Normal small bowel caliber. Vascular/Lymphatic: Normal aortic caliber. 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 The left common iliac is ectatic at 1.9 cm. No gross abdominopelvic adenopathy. Reproductive: Normal prostate. Other: Small bilateral fat containing inguinal hernias. Right sided retroperitoneal complex fluid collection is again identified. This is slightly decreased compared to 05/20/2016. Example 16.7 cm today versus 18.4 cm at the same level. This extends into the pelvis as before. Trace left pericolic gutter fluid is similar.  Anasarca. Musculoskeletal: Bilateral hip osteoarthritis. IMPRESSION: 1. Moderate-to-marked degradation, as detailed above. 2. Similar right pleural effusion with right lower lobe collapse/consolidation which could represent infection or atelectasis. More patchy left-sided airspace opacities are suspicious for infection or aspiration. 3. No explanation for fever within the abdomen or pelvis. 4. Slight decrease in size of a large right-sided retroperitoneal hematoma. 5. Cholelithiasis. 6. Pulmonary artery enlargement suggests pulmonary arterial hypertension. 7. Mild thoracic adenopathy, likely reactive. 8. Bilateral common iliac artery dilatation. 9. Bilateral indeterminate renal lesions. These could be re-evaluated with follow-up pre and post  contrast CT or MRI (as an outpatient) Electronically Signed   By: Abigail Miyamoto M.D.   On: 06/17/2016 21:03    Patient Profile     71 y/o male from [place] admitted 3/5 with altered mental status + H.flu PNA with aspiration, sepsis, hypoxic/hypercapnic respiratory failure. Course c/b  retroperitoneal bleed while on heparin with subsequent shock, acute renal failure &prolonged ventilation requiring tracheostomy.  On CRRT intermittently through hosp and has not tolerated HD due to hypotension.    Assessment & Plan    1 Atrial fibrillation-HR remains elevated; BP will not add AV nodal blocking agents; will add amiodarone 400 mg via tube BID to help with rate control (decrease to 200 mg daily after 2 weeks); some of tachycardia likely driven by acute illness. LV function normal; would not anticoagulate given recent retroperitoneal bleed. Note pt was in atrial fibrillation 3/5.  2 Hflu/pneumonia-per primary care  3 Acute renal failure-per nephrology  4 Retroperitoneal hemorrhage-hold all anticoagulation at this point.  Other issues per primary care.  Signed, Kirk Ruths, MD  06/19/2016, 9:53 AM

## 2016-06-19 NOTE — Progress Notes (Addendum)
INFECTIOUS DISEASE PROGRESS NOTE  ID: MICHIO THIER is a 71 y.o. male with  Active Problems:   Paroxysmal atrial fibrillation (HCC)   Hemiparesis affecting right side as late effect of cerebrovascular accident ([place])   Hypothyroidism   Acute respiratory failure with hypoxemia ([place])   Shock circulatory ([place])   Altered mental status   Retroperitoneal hemorrhage   Goals of care, counseling/discussion   On enteral nutrition   Acute renal failure (ARF) (HCC)   Electrolyte imbalance   Shock liver   Current use of proton pump inhibitor   Contraindication to deep vein thrombosis (DVT) prophylaxis   Hepatic lesion   Palliative care encounter   Acute respiratory failure with hypercapnia ([place])   AKI (acute kidney injury) ([place])   Stage 5 chronic kidney disease on chronic dialysis ([place])   HCAP (healthcare-associated pneumonia)   Palliative care by specialist   Tracheostomy care ([place])   Pressure injury of skin   Abdominal pain   FUO (fever of unknown origin)   Fracture of proximal humerus   Arm pain   Pleural effusion  Subjective: Minimal response  Abtx:  Anti-infectives    Start     Dose/Rate Route Frequency Ordered Stop   06/19/16 2200  acyclovir (ZOVIRAX) 500 mg in dextrose 5 % 100 mL IVPB     5 mg/kg  100 kg (Adjusted) 110 mL/hr over 60 Minutes Intravenous Every 24 hours 06/18/16 1135     06/19/16 1200  vancomycin (VANCOCIN) IVPB 1000 mg/200 mL premix  Status:  Discontinued     1,000 mg 200 mL/hr over 60 Minutes Intravenous Every M-W-F (Hemodialysis) 06/16/16 1255 06/18/16 1327   06/18/16 1500  Ampicillin-Sulbactam (UNASYN) 3 g in sodium chloride 0.9 % 100 mL IVPB     3 g 200 mL/hr over 30 Minutes Intravenous Daily at bedtime 06/18/16 1408     06/18/16 1230  acyclovir (ZOVIRAX) 500 mg in dextrose 5 % 100 mL IVPB     5 mg/kg  100 kg (Adjusted) 110 mL/hr over 60 Minutes Intravenous  Once 06/18/16 1125 06/18/16 1330   06/16/16 2200  acyclovir (ZOVIRAX) tablet 600 mg   Status:  Discontinued     600 mg Oral Once per day on Mon Wed Fri 06/15/16 1126 06/15/16 1145   06/16/16 2200  acyclovir (ZOVIRAX) 200 MG capsule 600 mg  Status:  Discontinued     600 mg Oral Once per day on Mon Wed Fri 06/15/16 1443 06/17/16 1326   06/16/16 2000  ceftAZIDime (FORTAZ) IVPB 2 g  Status:  Discontinued     2 g 100 mL/hr over 30 Minutes Intravenous Every M-W-F (2000) 06/15/16 1438 06/16/16 1652   06/16/16 2000  cefTAZidime (FORTAZ) 2 g in dextrose 5 % 50 mL IVPB  Status:  Discontinued     2 g 100 mL/hr over 30 Minutes Intravenous Every M-W-F (2000) 06/16/16 1652 06/18/16 0947   06/16/16 1200  vancomycin (VANCOCIN) IVPB 750 mg/150 ml premix     750 mg 150 mL/hr over 60 Minutes Intravenous Every Fri (Hemodialysis) 06/16/16 1157 06/16/16 1421   06/16/16 1000  acyclovir (ZOVIRAX) 200 MG capsule 600 mg  Status:  Discontinued     600 mg Oral Once per day on Mon Wed Fri 06/15/16 1145 06/15/16 1443   06/16/16 1000  acyclovir (ZOVIRAX) tablet 200 mg  Status:  Discontinued     200 mg Oral Daily 06/15/16 1435 06/16/16 0415   06/16/16 1000  acyclovir (ZOVIRAX) 200 MG capsule 200 mg  Status:  Discontinued     200 mg Oral Daily 06/16/16 0416 06/17/16 1326   06/15/16 2200  acyclovir (ZOVIRAX) 200 MG capsule 200 mg  Status:  Discontinued     200 mg Oral 2 times daily 06/15/16 0801 06/15/16 1126   06/15/16 2200  acyclovir (ZOVIRAX) tablet 200 mg  Status:  Discontinued     200 mg Oral Once per day on Sun Tue Thu Sat 06/15/16 1126 06/15/16 1144   06/15/16 2200  acyclovir (ZOVIRAX) 200 MG capsule 200 mg  Status:  Discontinued     200 mg Oral Once per day on Sun Tue Thu Sat 06/15/16 1144 06/17/16 1326   06/15/16 1200  acyclovir (ZOVIRAX) 200 MG capsule 200 mg  Status:  Discontinued     200 mg Oral Daily 06/15/16 1141 06/15/16 1435   06/15/16 1130  acyclovir (ZOVIRAX) tablet 400 mg     400 mg Oral  Once 06/15/16 1117 06/15/16 1446   06/15/16 1130  Ceftazidime (FORTAZ) injection 2 g  Status:   Discontinued     2 g Intravenous  Once 06/15/16 1117 06/15/16 1118   06/15/16 1130  cefTAZidime (FORTAZ) 2 g in dextrose 5 % 50 mL IVPB     2 g 100 mL/hr over 30 Minutes Intravenous  Once 06/15/16 1122 06/15/16 1240   06/15/16 1130  acyclovir (ZOVIRAX) tablet 200 mg  Status:  Discontinued     200 mg Oral  Every morning - 10a 06/15/16 1126 06/15/16 1141   06/15/16 0900  cefTAZidime (FORTAZ) 2 g in dextrose 5 % 50 mL IVPB  Status:  Discontinued     2 g 100 mL/hr over 30 Minutes Intravenous  Once 06/15/16 0720 06/15/16 1117   06/15/16 0900  acyclovir (ZOVIRAX) 200 MG capsule 400 mg  Status:  Discontinued     400 mg Oral  Once 06/15/16 0801 06/15/16 1117   06/15/16 0000  acyclovir (ZOVIRAX) 200 MG capsule 200 mg  Status:  Discontinued     200 mg Oral 2 times daily 06/14/16 2353 06/15/16 0801   06/14/16 2200  acyclovir (ZOVIRAX) tablet 200 mg  Status:  Discontinued     200 mg Oral 2 times daily 06/14/16 1533 06/14/16 2353   06/14/16 1800  cefTAZidime (FORTAZ) 2 g in dextrose 5 % 50 mL IVPB  Status:  Discontinued     2 g 100 mL/hr over 30 Minutes Intravenous Every M-W-F (Hemodialysis) 06/13/16 1338 06/15/16 1438   06/14/16 1600  acyclovir (ZOVIRAX) tablet 400 mg  Status:  Discontinued     400 mg Oral  Once 06/14/16 1533 06/15/16 0801   06/14/16 1200  vancomycin (VANCOCIN) IVPB 1000 mg/200 mL premix  Status:  Discontinued     1,000 mg 200 mL/hr over 60 Minutes Intravenous Every M-W-F (Hemodialysis) 06/13/16 1338 06/16/16 1157   06/13/16 1300  vancomycin (VANCOCIN) 2,000 mg in sodium chloride 0.9 % 500 mL IVPB     2,000 mg 250 mL/hr over 120 Minutes Intravenous  Once 06/13/16 1244 06/13/16 1645   06/13/16 1300  cefTAZidime (FORTAZ) 2 g in dextrose 5 % 50 mL IVPB     2 g 100 mL/hr over 30 Minutes Intravenous  Once 06/13/16 1244 06/13/16 1506   06/12/16 1448  ceFAZolin (ANCEF) 2-4 GM/100ML-% IVPB  Status:  Discontinued    Comments:  Shaaron Adler   : cabinet override      06/12/16 1448  06/12/16 1454   06/12/16 1432  ceFAZolin (ANCEF) 2-4 GM/100ML-% IVPB  CommentsHeath Lark, Amy   : cabinet override      06/12/16 1432 06/12/16 1436   06/07/2016 0900  meropenem (MERREM) 500 mg in sodium chloride 0.9 % 50 mL IVPB     500 mg 100 mL/hr over 30 Minutes Intravenous Every 24 hours 06/07/16 0955 06/11/16 1109   06/14/2016 0600  cefoTEtan (CEFOTAN) 2 g in dextrose 5 % 50 mL IVPB  Status:  Discontinued     2 g 100 mL/hr over 30 Minutes Intravenous To [facility] 06/07/16 1354 06/13/2016 1157   06/06/16 1200  vancomycin (VANCOCIN) 1,500 mg in sodium chloride 0.9 % 500 mL IVPB  Status:  Discontinued     1,500 mg 250 mL/hr over 120 Minutes Intravenous Every 24 hours 06/06/16 0818 06/07/16 0954   06/05/16 1000  meropenem (MERREM) 1 g in sodium chloride 0.9 % 100 mL IVPB  Status:  Discontinued     1 g 200 mL/hr over 30 Minutes Intravenous Every 12 hours 06/05/16 0926 06/07/16 0955   06/04/16 1400  vancomycin (VANCOCIN) 2,000 mg in sodium chloride 0.9 % 500 mL IVPB     2,000 mg 250 mL/hr over 120 Minutes Intravenous  Once 06/04/16 1234 06/04/16 1737   06/04/16 1400  meropenem (MERREM) 500 mg in sodium chloride 0.9 % 50 mL IVPB  Status:  Discontinued     500 mg 100 mL/hr over 30 Minutes Intravenous Every 24 hours 06/04/16 1234 06/05/16 0926   05/30/16 2300  ceFEPIme (MAXIPIME) 1 g in dextrose 5 % 50 mL IVPB  Status:  Discontinued     1 g 100 mL/hr over 30 Minutes Intravenous Every 24 hours 05/30/16 1223 06/04/16 1231   05/27/16 1400  ceFEPIme (MAXIPIME) 2 g in dextrose 5 % 50 mL IVPB  Status:  Discontinued     2 g 100 mL/hr over 30 Minutes Intravenous Every 12 hours 05/27/16 1014 05/30/16 1223   05/27/16 1400  vancomycin (VANCOCIN) 1,500 mg in sodium chloride 0.9 % 500 mL IVPB  Status:  Discontinued     1,500 mg 250 mL/hr over 120 Minutes Intravenous Every 24 hours 05/27/16 1014 05/30/16 1224   05/26/16 1300  ceFEPIme (MAXIPIME) 1 g in dextrose 5 % 50 mL IVPB  Status:   Discontinued     1 g 100 mL/hr over 30 Minutes Intravenous Every 24 hours 05/26/16 1225 05/27/16 1014   05/26/16 1230  vancomycin (VANCOCIN) 2,500 mg in sodium chloride 0.9 % 500 mL IVPB     2,500 mg 250 mL/hr over 120 Minutes Intravenous  Once 05/26/16 1225 05/26/16 1530   05/16/16 1400  cefTRIAXone (ROCEPHIN) 2 g in dextrose 5 % 50 mL IVPB     2 g 100 mL/hr over 30 Minutes Intravenous Every 24 hours 05/15/16 1649 05/17/16 1458   05/11/16 1400  cefTRIAXone (ROCEPHIN) 1 g in dextrose 5 % 50 mL IVPB  Status:  Discontinued     1 g 100 mL/hr over 30 Minutes Intravenous Every 24 hours 05/11/16 0833 05/15/16 1649   05/09/16 1400  piperacillin-tazobactam (ZOSYN) IVPB 3.375 g  Status:  Discontinued     3.375 g 12.5 mL/hr over 240 Minutes Intravenous Every 8 hours 05/09/16 1114 05/11/16 0833      Medications:  Scheduled: . acyclovir  5 mg/kg (Adjusted) Intravenous Q24H  . amiodarone  400 mg Oral BID  . ampicillin-sulbactam (UNASYN) IV  3 g Intravenous QHS  . anticoagulant sodium citrate  10 mL Intravenous Once  . budesonide (PULMICORT) nebulizer solution  0.5  mg Nebulization BID  . chlorhexidine gluconate (MEDLINE KIT)  15 mL Mouth Rinse BID  . [START ON Jul 11, 2016] darbepoetin (ARANESP) injection - DIALYSIS  200 mcg Intravenous Q Wed-HD  . feeding supplement (OSMOLITE 1.5 CAL)  1,000 mL Per Tube Q24H  . feeding supplement (PRO-STAT SUGAR FREE 64)  60 mL Per Tube TID  . insulin aspart  0-9 Units Subcutaneous Q4H  . ipratropium-albuterol  3 mL Nebulization BID  . levothyroxine  125 mcg Per Tube QAC breakfast  . mouth rinse  15 mL Mouth Rinse QID  . midodrine  15 mg Per Tube TID WC  . multivitamin  1 tablet Oral QHS  . pantoprazole sodium  40 mg Per Tube Daily    Objective: Vital signs in last 24 hours: Temp:  [98.4 F (36.9 C)-101.2 F (38.4 C)] 99.4 F (37.4 C) (04/16 0910) Pulse Rate:  [114-134] 121 (04/16 0910) Resp:  [16-34] 25 (04/16 0910) BP: (78-103)/(46-68) 96/65 (04/16  0910) SpO2:  [88 %-99 %] 93 % (04/16 1100) FiO2 (%):  [28 %-40 %] 40 % (04/16 1100)   General appearance: mild distress Neck: trach site with frothy d/c Resp: rhonchi bilaterally Chest wall: dressing over R anterior Cardio: regular rate and rhythm GI: normal findings: bowel sounds normal and soft, non-tender  Lab Results  Recent Labs  06/18/16 2030 06/19/16 0548  WBC 3.5* 4.3  HGB 8.6* 8.3*  HCT 27.4* 27.2*  NA 132* 132*  K 4.1 4.3  CL 95* 94*  CO2 27 27  BUN 84* 93*  CREATININE 5.74* 6.11*   Liver Panel  Recent Labs  06/18/16 0930 06/18/16 2030 06/19/16 0548  PROT 7.1  --   --   ALBUMIN 1.9* 1.9* 1.9*  AST 25  --   --   ALT 7*  --   --   ALKPHOS 84  --   --   BILITOT 1.3*  --   --    Sedimentation Rate  Recent Labs  06/18/16 0930  ESRSEDRATE 86*   C-Reactive Protein  Recent Labs  06/18/16 0930  CRP 4.1*    Microbiology: Recent Results (from the past 240 hour(s))  MRSA PCR Screening     Status: Abnormal   Collection Time: 06/11/16  4:18 AM  Result Value Ref Range Status   MRSA by PCR INVALID RESULTS, SPECIMEN SENT FOR CULTURE (A) NEGATIVE Final    Comment: NOTIFIED RN JENNIFER AT [phone] 06/11/16 BY A.DAVIS  MRSA culture     Status: None   Collection Time: 06/11/16  4:18 AM  Result Value Ref Range Status   Specimen Description NASAL SWAB  Final   Special Requests NONE  Final   Culture NO MRSA DETECTED  Final   Report Status 06/14/2016 FINAL  Final  Culture, Urine     Status: Abnormal   Collection Time: 06/13/16  8:08 AM  Result Value Ref Range Status   Specimen Description URINE, RANDOM  Final   Special Requests Normal  Final   Culture >=100,000 COLONIES/mL YEAST (A)  Final   Report Status 06/14/2016 FINAL  Final  Culture, respiratory (NON-Expectorated)     Status: None   Collection Time: 06/13/16  9:04 AM  Result Value Ref Range Status   Specimen Description TRACHEAL ASPIRATE  Final   Special Requests Normal  Final   Gram Stain   Final     MODERATE WBC PRESENT,BOTH PMN AND MONONUCLEAR FEW GRAM POSITIVE COCCI IN PAIRS IN CLUSTERS RARE GRAM NEGATIVE RODS    Culture  Consistent with normal respiratory flora.  Final   Report Status 06/15/2016 FINAL  Final  Culture, blood (Routine X 2) w Reflex to ID Panel     Status: None   Collection Time: 06/13/16  9:44 AM  Result Value Ref Range Status   Specimen Description BLOOD LEFT ARM  Final   Special Requests IN PEDIATRIC BOTTLE Blood Culture adequate volume  Final   Culture NO GROWTH 5 DAYS  Final   Report Status 06/18/2016 FINAL  Final  Culture, blood (Routine X 2) w Reflex to ID Panel     Status: None   Collection Time: 06/13/16 10:49 AM  Result Value Ref Range Status   Specimen Description BLOOD LEFT HAND  Final   Special Requests IN PEDIATRIC BOTTLE BCAV  Final   Culture NO GROWTH 5 DAYS  Final   Report Status 06/18/2016 FINAL  Final  Culture, blood (Routine X 2) w Reflex to ID Panel     Status: None (Preliminary result)   Collection Time: 06/17/16  9:45 AM  Result Value Ref Range Status   Specimen Description BLOOD RIGHT HAND  Final   Special Requests IN PEDIATRIC BOTTLE Blood Culture adequate volume  Final   Culture NO GROWTH 1 DAY  Final   Report Status PENDING  Incomplete  Culture, blood (Routine X 2) w Reflex to ID Panel     Status: Abnormal (Preliminary result)   Collection Time: 06/17/16  9:49 AM  Result Value Ref Range Status   Specimen Description BLOOD RIGHT HAND  Final   Special Requests IN PEDIATRIC BOTTLE Blood Culture adequate volume  Final   Culture  Setup Time   Final    GRAM POSITIVE COCCI IN CLUSTERS IN PEDIATRIC BOTTLE CRITICAL RESULT CALLED TO, READ BACK BY AND VERIFIED WITH: PHARMD N GAZDA [id number] MLM    Culture (A)  Final    STAPHYLOCOCCUS SPECIES (COAGULASE NEGATIVE) THE SIGNIFICANCE OF ISOLATING THIS ORGANISM FROM A SINGLE SET OF BLOOD CULTURES WHEN MULTIPLE SETS ARE DRAWN IS UNCERTAIN. PLEASE NOTIFY THE MICROBIOLOGY DEPARTMENT WITHIN ONE  WEEK IF SPECIATION AND SENSITIVITIES ARE REQUIRED.    Report Status PENDING  Incomplete  Blood Culture ID Panel (Reflexed)     Status: Abnormal   Collection Time: 06/17/16  9:49 AM  Result Value Ref Range Status   Enterococcus species NOT DETECTED NOT DETECTED Final   Listeria monocytogenes NOT DETECTED NOT DETECTED Final   Staphylococcus species DETECTED (A) NOT DETECTED Final    Comment: Methicillin (oxacillin) resistant coagulase negative staphylococcus. Possible blood culture contaminant (unless isolated from more than one blood culture draw or clinical case suggests pathogenicity). No antibiotic treatment is indicated for blood  culture contaminants. CRITICAL RESULT CALLED TO, READ BACK BY AND VERIFIED WITH: PHARMD N GAZDA [id number] MLM    Staphylococcus aureus NOT DETECTED NOT DETECTED Final   Methicillin resistance DETECTED (A) NOT DETECTED Final    Comment: CRITICAL RESULT CALLED TO, READ BACK BY AND VERIFIED WITH: PHARMD N GAZDA [id number] MLM    Streptococcus species NOT DETECTED NOT DETECTED Final   Streptococcus agalactiae NOT DETECTED NOT DETECTED Final   Streptococcus pneumoniae NOT DETECTED NOT DETECTED Final   Streptococcus pyogenes NOT DETECTED NOT DETECTED Final   Acinetobacter baumannii NOT DETECTED NOT DETECTED Final   Enterobacteriaceae species NOT DETECTED NOT DETECTED Final   Enterobacter cloacae complex NOT DETECTED NOT DETECTED Final   Escherichia coli NOT DETECTED NOT DETECTED Final   Klebsiella oxytoca NOT DETECTED NOT DETECTED Final  Klebsiella pneumoniae NOT DETECTED NOT DETECTED Final   Proteus species NOT DETECTED NOT DETECTED Final   Serratia marcescens NOT DETECTED NOT DETECTED Final   Haemophilus influenzae NOT DETECTED NOT DETECTED Final   Neisseria meningitidis NOT DETECTED NOT DETECTED Final   Pseudomonas aeruginosa NOT DETECTED NOT DETECTED Final   Candida albicans NOT DETECTED NOT DETECTED Final   Candida glabrata NOT DETECTED NOT DETECTED  Final   Candida krusei NOT DETECTED NOT DETECTED Final   Candida parapsilosis NOT DETECTED NOT DETECTED Final   Candida tropicalis NOT DETECTED NOT DETECTED Final    Studies/Results: Ct Abdomen Pelvis Wo Contrast  Result Date: 06/17/2016 CLINICAL DATA:  Fever of unknown origin. Morbid obesity. Thrombocytopenia. EXAM: CT CHEST, ABDOMEN AND PELVIS WITHOUT CONTRAST TECHNIQUE: Multidetector CT imaging of the chest, abdomen and pelvis was performed following the standard protocol without IV contrast. COMPARISON:  Abdominopelvic CT of 06/06/2016. Chest radiograph 06/17/2016. No comparison chest CT. FINDINGS: Multifactorial degradation, including patient morbid obesity, arms not raised above the head, lack of IV contrast, and mild motion. CT CHEST FINDINGS Cardiovascular: Left-sided central line which terminates at the high right atrium. Mild cardiomegaly. Right-sided central line terminates at the high SVC. Tortuous thoracic aorta. Pulmonary artery enlargement, 4.0 cm outflow tract. Mediastinum/Nodes: Borderline right paratracheal adenopathy at 10 mm. A node within the azygoesophageal recess measures 9 mm. Hilar regions poorly evaluated without intravenous contrast. Lungs/Pleura: Small right pleural effusion is grossly similar. Tracheostomy. Right lower lobe airspace disease. More patchy posterior left upper and lower lobe airspace opacities. Musculoskeletal: No acute osseous abnormality. CT ABDOMEN PELVIS FINDINGS Hepatobiliary: Markedly limited evaluation of the liver. Lesion described in the central right lobe on 05/20/2016 is not readily apparent. Multiple small gallstones without surrounding inflammation or biliary duct dilatation. Pancreas: Normal, without mass or ductal dilatation. Spleen: Normal in size, without focal abnormality. Adrenals/Urinary Tract: Normal adrenal glands. No renal calculi or hydronephrosis. 12 mm left renal interpolar exophytic lesion measures greater than fluid density on image 66/  series 3. This is similar in size to on the prior exam. Interpolar right renal lesion measures 2.4 cm and borderline greater than fluid density. Normal urinary bladder. Stomach/Bowel: A nasogastric tube is looped in the stomach, terminating in the gastric body. Catheter in the rectum. Scattered colonic diverticula. Normal small bowel caliber. Vascular/Lymphatic: Normal aortic caliber. 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 The left common iliac is ectatic at 1.9 cm. No gross abdominopelvic adenopathy. Reproductive: Normal prostate. Other: Small bilateral fat containing inguinal hernias. Right sided retroperitoneal complex fluid collection is again identified. This is slightly decreased compared to 05/20/2016. Example 16.7 cm today versus 18.4 cm at the same level. This extends into the pelvis as before. Trace left pericolic gutter fluid is similar.  Anasarca. Musculoskeletal: Bilateral hip osteoarthritis. IMPRESSION: 1. Moderate-to-marked degradation, as detailed above. 2. Similar right pleural effusion with right lower lobe collapse/consolidation which could represent infection or atelectasis. More patchy left-sided airspace opacities are suspicious for infection or aspiration. 3. No explanation for fever within the abdomen or pelvis. 4. Slight decrease in size of a large right-sided retroperitoneal hematoma. 5. Cholelithiasis. 6. Pulmonary artery enlargement suggests pulmonary arterial hypertension. 7. Mild thoracic adenopathy, likely reactive. 8. Bilateral common iliac artery dilatation. 9. Bilateral indeterminate renal lesions. These could be re-evaluated with follow-up pre and post contrast CT or MRI (as an outpatient) Electronically Signed   By: Abigail Miyamoto M.D.   On: 06/17/2016 21:03   Ct Head Wo Contrast  Result Date: 06/18/2016  CLINICAL DATA:  Mental status change.  Inpatient. EXAM: CT HEAD WITHOUT CONTRAST TECHNIQUE: Contiguous axial images were obtained from the base of the skull through the  vertex without intravenous contrast. COMPARISON:  06/04/2016 head CT. FINDINGS: Motion degraded scan. Brain: No evidence of parenchymal hemorrhage or extra-axial fluid collection. No mass lesion, mass effect, or midline shift. No CT evidence of acute infarction. Generalized cerebral volume loss. Nonspecific mild to moderate subcortical and periventricular white matter hypodensity, most in keeping with chronic small vessel ischemic change. No ventriculomegaly. Vascular: Intracranial atherosclerosis.  No acute abnormality. Skull: No evidence of calvarial fracture. Sinuses/Orbits: Mucoperiosteal thickening in the bilateral ethmoidal air cells, right frontal sinus and sphenoid sinuses. No fluid levels. Other: Partial bilateral mastoid effusions, left greater than right. Right nasal route tube enters the hypopharynx on the scout tomograms. IMPRESSION: 1.  No evidence of acute intracranial abnormality. 2. Generalized cerebral volume loss and chronic small vessel ischemia. 3. Paranasal sinusitis. 4. Bilateral partial mastoid effusions. Electronically Signed   By: Ilona Sorrel M.D.   On: 06/18/2016 23:16   Ct Chest Wo Contrast  Result Date: 06/17/2016 CLINICAL DATA:  Fever of unknown origin. Morbid obesity. Thrombocytopenia. EXAM: CT CHEST, ABDOMEN AND PELVIS WITHOUT CONTRAST TECHNIQUE: Multidetector CT imaging of the chest, abdomen and pelvis was performed following the standard protocol without IV contrast. COMPARISON:  Abdominopelvic CT of 06/06/2016. Chest radiograph 06/17/2016. No comparison chest CT. FINDINGS: Multifactorial degradation, including patient morbid obesity, arms not raised above the head, lack of IV contrast, and mild motion. CT CHEST FINDINGS Cardiovascular: Left-sided central line which terminates at the high right atrium. Mild cardiomegaly. Right-sided central line terminates at the high SVC. Tortuous thoracic aorta. Pulmonary artery enlargement, 4.0 cm outflow tract. Mediastinum/Nodes: Borderline  right paratracheal adenopathy at 10 mm. A node within the azygoesophageal recess measures 9 mm. Hilar regions poorly evaluated without intravenous contrast. Lungs/Pleura: Small right pleural effusion is grossly similar. Tracheostomy. Right lower lobe airspace disease. More patchy posterior left upper and lower lobe airspace opacities. Musculoskeletal: No acute osseous abnormality. CT ABDOMEN PELVIS FINDINGS Hepatobiliary: Markedly limited evaluation of the liver. Lesion described in the central right lobe on 05/20/2016 is not readily apparent. Multiple small gallstones without surrounding inflammation or biliary duct dilatation. Pancreas: Normal, without mass or ductal dilatation. Spleen: Normal in size, without focal abnormality. Adrenals/Urinary Tract: Normal adrenal glands. No renal calculi or hydronephrosis. 12 mm left renal interpolar exophytic lesion measures greater than fluid density on image 66/ series 3. This is similar in size to on the prior exam. Interpolar right renal lesion measures 2.4 cm and borderline greater than fluid density. Normal urinary bladder. Stomach/Bowel: A nasogastric tube is looped in the stomach, terminating in the gastric body. Catheter in the rectum. Scattered colonic diverticula. Normal small bowel caliber. Vascular/Lymphatic: Normal aortic caliber. 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 The left common iliac is ectatic at 1.9 cm. No gross abdominopelvic adenopathy. Reproductive: Normal prostate. Other: Small bilateral fat containing inguinal hernias. Right sided retroperitoneal complex fluid collection is again identified. This is slightly decreased compared to 05/20/2016. Example 16.7 cm today versus 18.4 cm at the same level. This extends into the pelvis as before. Trace left pericolic gutter fluid is similar.  Anasarca. Musculoskeletal: Bilateral hip osteoarthritis. IMPRESSION: 1. Moderate-to-marked degradation, as detailed above. 2. Similar right pleural effusion  with right lower lobe collapse/consolidation which could represent infection or atelectasis. More patchy left-sided airspace opacities are suspicious for infection or aspiration. 3. No explanation for fever within  the abdomen or pelvis. 4. Slight decrease in size of a large right-sided retroperitoneal hematoma. 5. Cholelithiasis. 6. Pulmonary artery enlargement suggests pulmonary arterial hypertension. 7. Mild thoracic adenopathy, likely reactive. 8. Bilateral common iliac artery dilatation. 9. Bilateral indeterminate renal lesions. These could be re-evaluated with follow-up pre and post contrast CT or MRI (as an outpatient) Electronically Signed   By: Abigail Miyamoto M.D.   On: 06/17/2016 21:03     Assessment/Plan: Coag Neg Staph 1/2 BCx Await 2nd BCx Consider HD line as source?  Zoster Continue acv for 2 weeks Keep in isolation til "dry and crusted"  Fever Tmax 100.7 last 24h Etiology unclear Erythema around his L chest HD line.  Will resume vanco Consider pulling line  Loose stool Will check C diff Has fever, normal WBC  Pleural effusion, RLL consolidation/collapse Would continue unasyn for 8 days total to treat as HCAP  Total days of antibiotics:  Day 5 of 14 acyclovir 4-10 vanco (prev 3-23 to 3-27) 4-15 unasyn (anbx since 3-23)       Goals of care?    Bobby Rumpf Infectious Diseases (pager) [phone] [email or web address] 06/19/2016, 11:25 AM  LOS: 42 days

## 2016-06-20 DIAGNOSIS — J9601 Acute respiratory failure with hypoxia: Secondary | ICD-10-CM

## 2016-06-20 DIAGNOSIS — S42201A Unspecified fracture of upper end of right humerus, initial encounter for closed fracture: Secondary | ICD-10-CM

## 2016-06-20 DIAGNOSIS — I481 Persistent atrial fibrillation: Secondary | ICD-10-CM

## 2016-06-20 DIAGNOSIS — R4020 Unspecified coma: Secondary | ICD-10-CM

## 2016-06-20 DIAGNOSIS — B029 Zoster without complications: Secondary | ICD-10-CM

## 2016-06-20 DIAGNOSIS — J90 Pleural effusion, not elsewhere classified: Secondary | ICD-10-CM

## 2016-06-20 LAB — GLUCOSE, CAPILLARY
GLUCOSE-CAPILLARY: 129 mg/dL — AB (ref 65–99)
GLUCOSE-CAPILLARY: 131 mg/dL — AB (ref 65–99)
Glucose-Capillary: 122 mg/dL — ABNORMAL HIGH (ref 65–99)
Glucose-Capillary: 130 mg/dL — ABNORMAL HIGH (ref 65–99)
Glucose-Capillary: 151 mg/dL — ABNORMAL HIGH (ref 65–99)

## 2016-06-20 LAB — CULTURE, BLOOD (ROUTINE X 2): SPECIAL REQUESTS: ADEQUATE

## 2016-06-20 LAB — CBC
HCT: 26.9 % — ABNORMAL LOW (ref 39.0–52.0)
Hemoglobin: 8.3 g/dL — ABNORMAL LOW (ref 13.0–17.0)
MCH: 34.2 pg — ABNORMAL HIGH (ref 26.0–34.0)
MCHC: 30.9 g/dL (ref 30.0–36.0)
MCV: 110.7 fL — AB (ref 78.0–100.0)
PLATELETS: 311 10*3/uL (ref 150–400)
RBC: 2.43 MIL/uL — ABNORMAL LOW (ref 4.22–5.81)
RDW: 20 % — AB (ref 11.5–15.5)
WBC: 4.5 10*3/uL (ref 4.0–10.5)

## 2016-06-20 LAB — RENAL FUNCTION PANEL
ALBUMIN: 2 g/dL — AB (ref 3.5–5.0)
Anion gap: 11 (ref 5–15)
BUN: 107 mg/dL — AB (ref 6–20)
CO2: 26 mmol/L (ref 22–32)
CREATININE: 7.22 mg/dL — AB (ref 0.61–1.24)
Calcium: 8.3 mg/dL — ABNORMAL LOW (ref 8.9–10.3)
Chloride: 94 mmol/L — ABNORMAL LOW (ref 101–111)
GFR calc Af Amer: 8 mL/min — ABNORMAL LOW (ref >60)
GFR, EST NON AFRICAN AMERICAN: 7 mL/min — AB (ref >60)
Glucose, Bld: 111 mg/dL — ABNORMAL HIGH (ref 65–99)
PHOSPHORUS: 9.9 mg/dL — AB (ref 2.5–4.6)
Potassium: 4.4 mmol/L (ref 3.5–5.1)
Sodium: 131 mmol/L — ABNORMAL LOW (ref 135–145)

## 2016-06-20 LAB — HEPATITIS B SURFACE ANTIGEN: HEP B S AG: NEGATIVE

## 2016-06-20 MED ORDER — GABAPENTIN 250 MG/5ML PO SOLN
300.0000 mg | Freq: Two times a day (BID) | ORAL | Status: DC
  Administered 2016-06-20: 300 mg
  Filled 2016-06-20 (×3): qty 6

## 2016-06-20 MED ORDER — SODIUM CHLORIDE 0.9% FLUSH
3.0000 mL | Freq: Two times a day (BID) | INTRAVENOUS | Status: DC
  Administered 2016-06-20: 3 mL via INTRAVENOUS

## 2016-06-20 MED ORDER — SCOPOLAMINE 1 MG/3DAYS TD PT72
1.0000 | MEDICATED_PATCH | TRANSDERMAL | Status: DC
  Administered 2016-06-20: 1.5 mg via TRANSDERMAL
  Filled 2016-06-20: qty 1

## 2016-06-20 MED ORDER — POLYVINYL ALCOHOL 1.4 % OP SOLN
1.0000 [drp] | Freq: Four times a day (QID) | OPHTHALMIC | Status: DC | PRN
  Filled 2016-06-20: qty 15

## 2016-06-20 MED ORDER — GABAPENTIN 250 MG/5ML PO SOLN: 300.0000 mg | Freq: Three times a day (TID) | ORAL | Status: DC

## 2016-06-20 MED ORDER — ONDANSETRON HCL 4 MG/2ML IJ SOLN: 4.0000 mg | Freq: Four times a day (QID) | INTRAMUSCULAR | Status: DC | PRN

## 2016-06-20 MED ORDER — LORAZEPAM 2 MG/ML PO CONC
1.0000 mg | ORAL | Status: DC | PRN
Start: 2016-06-20 — End: 2016-06-21

## 2016-06-20 MED ORDER — HALOPERIDOL LACTATE 2 MG/ML PO CONC
0.5000 mg | ORAL | Status: DC | PRN
  Filled 2016-06-20: qty 0.3

## 2016-06-20 MED ORDER — SODIUM CHLORIDE 0.9% FLUSH: 3.0000 mL | INTRAVENOUS | Status: DC | PRN

## 2016-06-20 MED ORDER — HALOPERIDOL LACTATE 5 MG/ML IJ SOLN: 0.5000 mg | INTRAMUSCULAR | Status: DC | PRN

## 2016-06-20 MED ORDER — ONDANSETRON 4 MG PO TBDP: 4.0000 mg | ORAL_TABLET | Freq: Four times a day (QID) | ORAL | Status: DC | PRN

## 2016-06-20 MED ORDER — LORAZEPAM 1 MG PO TABS: 1.0000 mg | ORAL_TABLET | ORAL | Status: DC | PRN

## 2016-06-20 MED ORDER — BIOTENE DRY MOUTH MT LIQD: 15.0000 mL | OROMUCOSAL | Status: DC | PRN

## 2016-06-20 MED ORDER — SODIUM CHLORIDE 0.9 % IV SOLN: 250.0000 mL | INTRAVENOUS | Status: DC | PRN

## 2016-06-20 MED ORDER — GLYCOPYRROLATE 0.2 MG/ML IJ SOLN
0.4000 mg | Freq: Three times a day (TID) | INTRAMUSCULAR | Status: DC | PRN
  Administered 2016-06-20: 0.4 mg via INTRAVENOUS
  Filled 2016-06-20: qty 2

## 2016-06-20 MED ORDER — FENTANYL BOLUS VIA INFUSION
50.0000 ug | INTRAVENOUS | Status: DC | PRN
  Filled 2016-06-20: qty 200

## 2016-06-20 MED ORDER — FENTANYL CITRATE (PF) 2500 MCG/50ML IJ SOLN
50.0000 ug/h | INTRAMUSCULAR | Status: DC
  Administered 2016-06-20: 50 ug/h via INTRAVENOUS
  Filled 2016-06-20: qty 50

## 2016-06-20 MED ORDER — LORAZEPAM 2 MG/ML IJ SOLN
1.0000 mg | INTRAMUSCULAR | Status: DC | PRN
Start: 2016-06-20 — End: 2016-06-21

## 2016-06-20 MED ORDER — HALOPERIDOL 0.5 MG PO TABS
0.5000 mg | ORAL_TABLET | ORAL | Status: DC | PRN
  Filled 2016-06-20: qty 1

## 2016-06-20 MED ORDER — LIDOCAINE 5 % EX PTCH
1.0000 | MEDICATED_PATCH | CUTANEOUS | Status: DC
  Administered 2016-06-20: 1 via TRANSDERMAL
  Filled 2016-06-20 (×2): qty 1

## 2016-06-20 NOTE — Progress Notes (Signed)
Spoke with Brayton Caves, RN regarding PICC placement.  Patient's BP is currently too low to place PICC at bedside and must be consistently above 90 systolic to place.  Patient currently has IV access and HDC if necessary.  Brayton Caves, RN also stated that there was a palliative consult for today.  Gasper Lloyd, RN VAST

## 2016-06-20 NOTE — Progress Notes (Signed)
INFECTIOUS DISEASE PROGRESS NOTE  ID: Omar [name] is a 71 y.o. male with  Active Problems:   Paroxysmal atrial fibrillation (HCC)   Hemiparesis affecting right side as late effect of cerebrovascular accident ([place])   Hypothyroidism   Acute respiratory failure with hypoxemia ([place])   Shock circulatory ([place])   Mental status change   Retroperitoneal hemorrhage   Goals of care, counseling/discussion   On enteral nutrition   Acute renal failure (ARF) (HCC)   Electrolyte imbalance   Shock liver   Current use of proton pump inhibitor   Contraindication to deep vein thrombosis (DVT) prophylaxis   Hepatic lesion   Palliative care encounter   Acute respiratory failure with hypercapnia (HCC)   AKI (acute kidney injury) ([place])   Stage 5 chronic kidney disease on chronic dialysis ([place])   HCAP (healthcare-associated pneumonia)   Palliative care by specialist   Tracheostomy care ([place])   Pressure injury of skin   Abdominal pain   FUO (fever of unknown origin)   Fracture of proximal humerus   Arm pain   Pleural effusion   Dysphagia   Encounter for orogastric (OG) tube placement   Encounter for orogastric tube placement  Subjective: No response  Abtx:  Anti-infectives    Start     Dose/Rate Route Frequency Ordered Stop   06/19/16 2200  acyclovir (ZOVIRAX) 500 mg in dextrose 5 % 100 mL IVPB     5 mg/kg  100 kg (Adjusted) 110 mL/hr over 60 Minutes Intravenous Every 24 hours 06/18/16 1135     06/19/16 2200  Ampicillin-Sulbactam (UNASYN) 3 g in sodium chloride 0.9 % 100 mL IVPB     3 g 200 mL/hr over 30 Minutes Intravenous Daily at bedtime 06/19/16 1139 06/26/16 2159   06/19/16 1215  vancomycin (VANCOCIN) IVPB 1000 mg/200 mL premix     1,000 mg 200 mL/hr over 60 Minutes Intravenous Every M-W-F (Hemodialysis) 06/19/16 1207 06/19/16 1315   06/19/16 1200  vancomycin (VANCOCIN) IVPB 1000 mg/200 mL premix  Status:  Discontinued     1,000 mg 200 mL/hr over 60 Minutes Intravenous Every  M-W-F (Hemodialysis) 06/16/16 1255 06/18/16 1327   06/18/16 1500  Ampicillin-Sulbactam (UNASYN) 3 g in sodium chloride 0.9 % 100 mL IVPB  Status:  Discontinued     3 g 200 mL/hr over 30 Minutes Intravenous Daily at bedtime 06/18/16 1408 06/19/16 1139   06/18/16 1230  acyclovir (ZOVIRAX) 500 mg in dextrose 5 % 100 mL IVPB     5 mg/kg  100 kg (Adjusted) 110 mL/hr over 60 Minutes Intravenous  Once 06/18/16 1125 06/18/16 1330   06/16/16 2200  acyclovir (ZOVIRAX) tablet 600 mg  Status:  Discontinued     600 mg Oral Once per day on Mon Wed Fri 06/15/16 1126 06/15/16 1145   06/16/16 2200  acyclovir (ZOVIRAX) 200 MG capsule 600 mg  Status:  Discontinued     600 mg Oral Once per day on Mon Wed Fri 06/15/16 1443 06/17/16 1326   06/16/16 2000  ceftAZIDime (FORTAZ) IVPB 2 g  Status:  Discontinued     2 g 100 mL/hr over 30 Minutes Intravenous Every M-W-F (2000) 06/15/16 1438 06/16/16 1652   06/16/16 2000  cefTAZidime (FORTAZ) 2 g in dextrose 5 % 50 mL IVPB  Status:  Discontinued     2 g 100 mL/hr over 30 Minutes Intravenous Every M-W-F (2000) 06/16/16 1652 06/18/16 0947   06/16/16 1200  vancomycin (VANCOCIN) IVPB 750 mg/150 ml premix  750 mg 150 mL/hr over 60 Minutes Intravenous Every Fri (Hemodialysis) 06/16/16 1157 06/16/16 1421   06/16/16 1000  acyclovir (ZOVIRAX) 200 MG capsule 600 mg  Status:  Discontinued     600 mg Oral Once per day on Mon Wed Fri 06/15/16 1145 06/15/16 1443   06/16/16 1000  acyclovir (ZOVIRAX) tablet 200 mg  Status:  Discontinued     200 mg Oral Daily 06/15/16 1435 06/16/16 0415   06/16/16 1000  acyclovir (ZOVIRAX) 200 MG capsule 200 mg  Status:  Discontinued     200 mg Oral Daily 06/16/16 0416 06/17/16 1326   06/15/16 2200  acyclovir (ZOVIRAX) 200 MG capsule 200 mg  Status:  Discontinued     200 mg Oral 2 times daily 06/15/16 0801 06/15/16 1126   06/15/16 2200  acyclovir (ZOVIRAX) tablet 200 mg  Status:  Discontinued     200 mg Oral Once per day on Sun Tue Thu Sat  06/15/16 1126 06/15/16 1144   06/15/16 2200  acyclovir (ZOVIRAX) 200 MG capsule 200 mg  Status:  Discontinued     200 mg Oral Once per day on Sun Tue Thu Sat 06/15/16 1144 06/17/16 1326   06/15/16 1200  acyclovir (ZOVIRAX) 200 MG capsule 200 mg  Status:  Discontinued     200 mg Oral Daily 06/15/16 1141 06/15/16 1435   06/15/16 1130  acyclovir (ZOVIRAX) tablet 400 mg     400 mg Oral  Once 06/15/16 1117 06/15/16 1446   06/15/16 1130  Ceftazidime (FORTAZ) injection 2 g  Status:  Discontinued     2 g Intravenous  Once 06/15/16 1117 06/15/16 1118   06/15/16 1130  cefTAZidime (FORTAZ) 2 g in dextrose 5 % 50 mL IVPB     2 g 100 mL/hr over 30 Minutes Intravenous  Once 06/15/16 1122 06/15/16 1240   06/15/16 1130  acyclovir (ZOVIRAX) tablet 200 mg  Status:  Discontinued     200 mg Oral  Every morning - 10a 06/15/16 1126 06/15/16 1141   06/15/16 0900  cefTAZidime (FORTAZ) 2 g in dextrose 5 % 50 mL IVPB  Status:  Discontinued     2 g 100 mL/hr over 30 Minutes Intravenous  Once 06/15/16 0720 06/15/16 1117   06/15/16 0900  acyclovir (ZOVIRAX) 200 MG capsule 400 mg  Status:  Discontinued     400 mg Oral  Once 06/15/16 0801 06/15/16 1117   06/15/16 0000  acyclovir (ZOVIRAX) 200 MG capsule 200 mg  Status:  Discontinued     200 mg Oral 2 times daily 06/14/16 2353 06/15/16 0801   06/14/16 2200  acyclovir (ZOVIRAX) tablet 200 mg  Status:  Discontinued     200 mg Oral 2 times daily 06/14/16 1533 06/14/16 2353   06/14/16 1800  cefTAZidime (FORTAZ) 2 g in dextrose 5 % 50 mL IVPB  Status:  Discontinued     2 g 100 mL/hr over 30 Minutes Intravenous Every M-W-F (Hemodialysis) 06/13/16 1338 06/15/16 1438   06/14/16 1600  acyclovir (ZOVIRAX) tablet 400 mg  Status:  Discontinued     400 mg Oral  Once 06/14/16 1533 06/15/16 0801   06/14/16 1200  vancomycin (VANCOCIN) IVPB 1000 mg/200 mL premix  Status:  Discontinued     1,000 mg 200 mL/hr over 60 Minutes Intravenous Every M-W-F (Hemodialysis) 06/13/16 1338 06/16/16  1157   06/13/16 1300  vancomycin (VANCOCIN) 2,000 mg in sodium chloride 0.9 % 500 mL IVPB     2,000 mg 250 mL/hr over 120 Minutes Intravenous  Once 06/13/16 1244 06/13/16 1645   06/13/16 1300  cefTAZidime (FORTAZ) 2 g in dextrose 5 % 50 mL IVPB     2 g 100 mL/hr over 30 Minutes Intravenous  Once 06/13/16 1244 06/13/16 1506   06/12/16 1448  ceFAZolin (ANCEF) 2-4 GM/100ML-% IVPB  Status:  Discontinued    Comments:  Shaaron Adler   : cabinet override      06/12/16 1448 06/12/16 1454   06/12/16 1432  ceFAZolin (ANCEF) 2-4 GM/100ML-% IVPB    Comments:  Laughlin, Amy   : cabinet override      06/12/16 1432 06/12/16 1436   06/24/2016 0900  meropenem (MERREM) 500 mg in sodium chloride 0.9 % 50 mL IVPB     500 mg 100 mL/hr over 30 Minutes Intravenous Every 24 hours 06/07/16 0955 06/11/16 1109   06/29/2016 0600  cefoTEtan (CEFOTAN) 2 g in dextrose 5 % 50 mL IVPB  Status:  Discontinued     2 g 100 mL/hr over 30 Minutes Intravenous To [facility] 06/07/16 1354 06/05/2016 1157   06/06/16 1200  vancomycin (VANCOCIN) 1,500 mg in sodium chloride 0.9 % 500 mL IVPB  Status:  Discontinued     1,500 mg 250 mL/hr over 120 Minutes Intravenous Every 24 hours 06/06/16 0818 06/07/16 0954   06/05/16 1000  meropenem (MERREM) 1 g in sodium chloride 0.9 % 100 mL IVPB  Status:  Discontinued     1 g 200 mL/hr over 30 Minutes Intravenous Every 12 hours 06/05/16 0926 06/07/16 0955   06/04/16 1400  vancomycin (VANCOCIN) 2,000 mg in sodium chloride 0.9 % 500 mL IVPB     2,000 mg 250 mL/hr over 120 Minutes Intravenous  Once 06/04/16 1234 06/04/16 1737   06/04/16 1400  meropenem (MERREM) 500 mg in sodium chloride 0.9 % 50 mL IVPB  Status:  Discontinued     500 mg 100 mL/hr over 30 Minutes Intravenous Every 24 hours 06/04/16 1234 06/05/16 0926   05/30/16 2300  ceFEPIme (MAXIPIME) 1 g in dextrose 5 % 50 mL IVPB  Status:  Discontinued     1 g 100 mL/hr over 30 Minutes Intravenous Every 24 hours 05/30/16 1223 06/04/16  1231   05/27/16 1400  ceFEPIme (MAXIPIME) 2 g in dextrose 5 % 50 mL IVPB  Status:  Discontinued     2 g 100 mL/hr over 30 Minutes Intravenous Every 12 hours 05/27/16 1014 05/30/16 1223   05/27/16 1400  vancomycin (VANCOCIN) 1,500 mg in sodium chloride 0.9 % 500 mL IVPB  Status:  Discontinued     1,500 mg 250 mL/hr over 120 Minutes Intravenous Every 24 hours 05/27/16 1014 05/30/16 1224   05/26/16 1300  ceFEPIme (MAXIPIME) 1 g in dextrose 5 % 50 mL IVPB  Status:  Discontinued     1 g 100 mL/hr over 30 Minutes Intravenous Every 24 hours 05/26/16 1225 05/27/16 1014   05/26/16 1230  vancomycin (VANCOCIN) 2,500 mg in sodium chloride 0.9 % 500 mL IVPB     2,500 mg 250 mL/hr over 120 Minutes Intravenous  Once 05/26/16 1225 05/26/16 1530   05/16/16 1400  cefTRIAXone (ROCEPHIN) 2 g in dextrose 5 % 50 mL IVPB     2 g 100 mL/hr over 30 Minutes Intravenous Every 24 hours 05/15/16 1649 05/17/16 1458   05/11/16 1400  cefTRIAXone (ROCEPHIN) 1 g in dextrose 5 % 50 mL IVPB  Status:  Discontinued     1 g 100 mL/hr over 30 Minutes Intravenous Every 24 hours 05/11/16 0833  05/15/16 1649   05/09/16 1400  piperacillin-tazobactam (ZOSYN) IVPB 3.375 g  Status:  Discontinued     3.375 g 12.5 mL/hr over 240 Minutes Intravenous Every 8 hours 05/09/16 1114 05/11/16 0833      Medications:  Scheduled: . amiodarone  400 mg Oral BID  . budesonide (PULMICORT) nebulizer solution  0.5 mg Nebulization BID  . chlorhexidine gluconate (MEDLINE KIT)  15 mL Mouth Rinse BID  . [START ON 07/01/2016] darbepoetin (ARANESP) injection - DIALYSIS  200 mcg Intravenous Q Wed-HD  . feeding supplement (OSMOLITE 1.5 CAL)  1,000 mL Per Tube Q24H  . feeding supplement (PRO-STAT SUGAR FREE 64)  60 mL Per Tube TID  . gabapentin  300 mg Per Tube Q12H  . insulin aspart  0-9 Units Subcutaneous Q4H  . ipratropium-albuterol  3 mL Nebulization BID  . levothyroxine  125 mcg Per Tube QAC breakfast  . lidocaine  1 patch Transdermal Q24H  . mouth  rinse  15 mL Mouth Rinse QID  . midodrine  15 mg Per Tube TID WC  . multivitamin  1 tablet Oral QHS  . pantoprazole sodium  40 mg Per Tube Daily  . scopolamine  1 patch Transdermal Q72H    Objective: Vital signs in last 24 hours: Temp:  [99 F (37.2 C)-101.1 F (38.4 C)] 99 F (37.2 C) (04/17 0731) Pulse Rate:  [28-132] 119 (04/17 1000) Resp:  [16-31] 23 (04/17 1000) BP: (75-114)/(38-79) 87/56 (04/17 1000) SpO2:  [90 %-100 %] 93 % (04/17 1000) FiO2 (%):  [40 %] 40 % (04/17 0901) Weight:  [197 kg (434 lb 4.9 oz)] 197 kg (434 lb 4.9 oz) (04/17 0300)   General appearance: no distress Resp: rhonchi bilaterally Cardio: regular rate and rhythm GI: normal findings: soft, non-tender and abnormal findings:  distended and hypoactive bowel sounds Neurologic: Mental status: alertness: unresponsive  Lab Results  Recent Labs  06/19/16 0548 06/20/16 0150  WBC 4.3 4.5  HGB 8.3* 8.3*  HCT 27.2* 26.9*  NA 132* 131*  K 4.3 4.4  CL 94* 94*  CO2 27 26  BUN 93* 107*  CREATININE 6.11* 7.22*   Liver Panel  Recent Labs  06/18/16 0930  06/19/16 0548 06/20/16 0150  PROT 7.1  --   --   --   ALBUMIN 1.9*  < > 1.9* 2.0*  AST 25  --   --   --   ALT 7*  --   --   --   ALKPHOS 84  --   --   --   BILITOT 1.3*  --   --   --   < > = values in this interval not displayed. Sedimentation Rate  Recent Labs  06/18/16 0930  ESRSEDRATE 86*   C-Reactive Protein  Recent Labs  06/18/16 0930  CRP 4.1*    Microbiology: Recent Results (from the past 240 hour(s))  MRSA PCR Screening     Status: Abnormal   Collection Time: 06/11/16  4:18 AM  Result Value Ref Range Status   MRSA by PCR INVALID RESULTS, SPECIMEN SENT FOR CULTURE (A) NEGATIVE Final    Comment: NOTIFIED RN JENNIFER AT [phone] 06/11/16 BY A.DAVIS  MRSA culture     Status: None   Collection Time: 06/11/16  4:18 AM  Result Value Ref Range Status   Specimen Description NASAL SWAB  Final   Special Requests NONE  Final   Culture NO  MRSA DETECTED  Final   Report Status 06/14/2016 FINAL  Final  Culture, Urine  Status: Abnormal   Collection Time: 06/13/16  8:08 AM  Result Value Ref Range Status   Specimen Description URINE, RANDOM  Final   Special Requests Normal  Final   Culture >=100,000 COLONIES/mL YEAST (A)  Final   Report Status 06/14/2016 FINAL  Final  Culture, respiratory (NON-Expectorated)     Status: None   Collection Time: 06/13/16  9:04 AM  Result Value Ref Range Status   Specimen Description TRACHEAL ASPIRATE  Final   Special Requests Normal  Final   Gram Stain   Final    MODERATE WBC PRESENT,BOTH PMN AND MONONUCLEAR FEW GRAM POSITIVE COCCI IN PAIRS IN CLUSTERS RARE GRAM NEGATIVE RODS    Culture Consistent with normal respiratory flora.  Final   Report Status 06/15/2016 FINAL  Final  Culture, blood (Routine X 2) w Reflex to ID Panel     Status: None   Collection Time: 06/13/16  9:44 AM  Result Value Ref Range Status   Specimen Description BLOOD LEFT ARM  Final   Special Requests IN PEDIATRIC BOTTLE Blood Culture adequate volume  Final   Culture NO GROWTH 5 DAYS  Final   Report Status 06/18/2016 FINAL  Final  Culture, blood (Routine X 2) w Reflex to ID Panel     Status: None   Collection Time: 06/13/16 10:49 AM  Result Value Ref Range Status   Specimen Description BLOOD LEFT HAND  Final   Special Requests IN PEDIATRIC BOTTLE BCAV  Final   Culture NO GROWTH 5 DAYS  Final   Report Status 06/18/2016 FINAL  Final  Culture, blood (Routine X 2) w Reflex to ID Panel     Status: None (Preliminary result)   Collection Time: 06/17/16  9:45 AM  Result Value Ref Range Status   Specimen Description BLOOD RIGHT HAND  Final   Special Requests IN PEDIATRIC BOTTLE Blood Culture adequate volume  Final   Culture NO GROWTH 3 DAYS  Final   Report Status PENDING  Incomplete  Culture, blood (Routine X 2) w Reflex to ID Panel     Status: Abnormal   Collection Time: 06/17/16  9:49 AM  Result Value Ref Range Status    Specimen Description BLOOD RIGHT HAND  Final   Special Requests IN PEDIATRIC BOTTLE Blood Culture adequate volume  Final   Culture  Setup Time   Final    GRAM POSITIVE COCCI IN CLUSTERS IN PEDIATRIC BOTTLE CRITICAL RESULT CALLED TO, READ BACK BY AND VERIFIED WITH: PHARMD N GAZDA [id number] MLM    Culture (A)  Final    STAPHYLOCOCCUS SPECIES (COAGULASE NEGATIVE) THE SIGNIFICANCE OF ISOLATING THIS ORGANISM FROM A SINGLE SET OF BLOOD CULTURES WHEN MULTIPLE SETS ARE DRAWN IS UNCERTAIN. PLEASE NOTIFY THE MICROBIOLOGY DEPARTMENT WITHIN ONE WEEK IF SPECIATION AND SENSITIVITIES ARE REQUIRED.    Report Status 06/20/2016 FINAL  Final  Blood Culture ID Panel (Reflexed)     Status: Abnormal   Collection Time: 06/17/16  9:49 AM  Result Value Ref Range Status   Enterococcus species NOT DETECTED NOT DETECTED Final   Listeria monocytogenes NOT DETECTED NOT DETECTED Final   Staphylococcus species DETECTED (A) NOT DETECTED Final    Comment: Methicillin (oxacillin) resistant coagulase negative staphylococcus. Possible blood culture contaminant (unless isolated from more than one blood culture draw or clinical case suggests pathogenicity). No antibiotic treatment is indicated for blood  culture contaminants. CRITICAL RESULT CALLED TO, READ BACK BY AND VERIFIED WITH: PHARMD N GAZDA [id number] MLM    Staphylococcus aureus  NOT DETECTED NOT DETECTED Final   Methicillin resistance DETECTED (A) NOT DETECTED Final    Comment: CRITICAL RESULT CALLED TO, READ BACK BY AND VERIFIED WITH: PHARMD N GAZDA [id number] MLM    Streptococcus species NOT DETECTED NOT DETECTED Final   Streptococcus agalactiae NOT DETECTED NOT DETECTED Final   Streptococcus pneumoniae NOT DETECTED NOT DETECTED Final   Streptococcus pyogenes NOT DETECTED NOT DETECTED Final   Acinetobacter baumannii NOT DETECTED NOT DETECTED Final   Enterobacteriaceae species NOT DETECTED NOT DETECTED Final   Enterobacter cloacae complex NOT DETECTED  NOT DETECTED Final   Escherichia coli NOT DETECTED NOT DETECTED Final   Klebsiella oxytoca NOT DETECTED NOT DETECTED Final   Klebsiella pneumoniae NOT DETECTED NOT DETECTED Final   Proteus species NOT DETECTED NOT DETECTED Final   Serratia marcescens NOT DETECTED NOT DETECTED Final   Haemophilus influenzae NOT DETECTED NOT DETECTED Final   Neisseria meningitidis NOT DETECTED NOT DETECTED Final   Pseudomonas aeruginosa NOT DETECTED NOT DETECTED Final   Candida albicans NOT DETECTED NOT DETECTED Final   Candida glabrata NOT DETECTED NOT DETECTED Final   Candida krusei NOT DETECTED NOT DETECTED Final   Candida parapsilosis NOT DETECTED NOT DETECTED Final   Candida tropicalis NOT DETECTED NOT DETECTED Final  C difficile quick scan w PCR reflex     Status: None   Collection Time: 06/19/16  1:30 PM  Result Value Ref Range Status   C Diff antigen NEGATIVE NEGATIVE Final   C Diff toxin NEGATIVE NEGATIVE Final   C Diff interpretation No C. difficile detected.  Final    Studies/Results: Ct Head Wo Contrast  Result Date: 06/18/2016 CLINICAL DATA:  Mental status change.  Inpatient. EXAM: CT HEAD WITHOUT CONTRAST TECHNIQUE: Contiguous axial images were obtained from the base of the skull through the vertex without intravenous contrast. COMPARISON:  06/04/2016 head CT. FINDINGS: Motion degraded scan. Brain: No evidence of parenchymal hemorrhage or extra-axial fluid collection. No mass lesion, mass effect, or midline shift. No CT evidence of acute infarction. Generalized cerebral volume loss. Nonspecific mild to moderate subcortical and periventricular white matter hypodensity, most in keeping with chronic small vessel ischemic change. No ventriculomegaly. Vascular: Intracranial atherosclerosis.  No acute abnormality. Skull: No evidence of calvarial fracture. Sinuses/Orbits: Mucoperiosteal thickening in the bilateral ethmoidal air cells, right frontal sinus and sphenoid sinuses. No fluid levels. Other:  Partial bilateral mastoid effusions, left greater than right. Right nasal route tube enters the hypopharynx on the scout tomograms. IMPRESSION: 1.  No evidence of acute intracranial abnormality. 2. Generalized cerebral volume loss and chronic small vessel ischemia. 3. Paranasal sinusitis. 4. Bilateral partial mastoid effusions. Electronically Signed   By: Ilona Sorrel M.D.   On: 06/18/2016 23:16     Assessment/Plan: Afib with hypotension  ARF/ESRD with hypotension Would need to be on pressure support to get CVVHD  [place] Needs to be "dry and crusted" to come out of isolation  Fever Temp 101 this AM ? From HD line. 1/2 BCx 4-14 (-) He has peripheral line.  C diff (-)  Palliative care note reviewed, appreciated.  Now DNR, possible comfort care today   Total days of antibiotics:  Day 6 of 14 acyclovir 4-10 vanco (prev 3-23 to 3-27) 4-15 unasyn (anbx since 3-23)         Bobby Rumpf Infectious Diseases (pager) [phone] [email or web address] 06/20/2016, 11:39 AM  LOS: 43 days

## 2016-06-20 NOTE — Progress Notes (Signed)
Daily Progress Note   Patient Name: Omar [name]       Date: 06/20/2016 DOB: 1946-03-04  Age: 71 y.o. MRN#: [id number] Attending Physician: Murlean Iba, MD Primary Care Physician: Redge Gainer, MD Admit Date: 06/03/2016  Reason for Consultation/Follow-up: Establishing goals of care  Subjective:  Patient non verbal.  Barely opens 1 eye.  Grimaces in pain.  Does not appear to be able to relax his head and neck.  Left voice mail for [facility]).  Dr. Wynetta Emery and I plan to meet with the the family this evening at 5:30 pm.  Per nephrology his BP is too low to dialyze without the patient returning to the ICU, being on pressors and utilizing CRRT.    Assessment: Patient appears to be suffering heavily.  He does not open his eyes, and he does not speak, but attempts to pull at his tubes, trach, IVs.  He grimaces when touched.  Multiple organ systems have failed including nephrological, pulmonary, CNS, musculoskeletal.  These systems do not show potential for meaningful recovery.  He is appropriate for comfort measures.     Patient Profile/HPI: 71 y.o. male  with past medical history of Obesity (current weight 340 pounds; albumin 1.7), atrial fib, gout, hypertension, multinodular goiter, osteoarthritis, hyperlipidemia, stroke (wheelchair bound at baseline), thrombocytopenia, vitamin D deficiency admitted from [facility] on  05/22/2016 . Critically ill after testing positive for the flu, pneumonia, with altered mental status. Patient developed a retroperitoneal bleed secondary to heparin. He also developed acute on chronic renal failure and has been on CRRT. He has undergone a trach and has been intermittently on ventilator support. He now has a fractured right arm and is having severe pain from  Shingles on his chest.     Length of Stay: 43  Current Medications: Scheduled Meds:  . amiodarone  400 mg Oral BID  . budesonide (PULMICORT) nebulizer solution  0.5 mg Nebulization BID  . chlorhexidine gluconate (MEDLINE KIT)  15 mL Mouth Rinse BID  . [START ON 06/24/2016] darbepoetin (ARANESP) injection - DIALYSIS  200 mcg Intravenous Q Wed-HD  . feeding supplement (OSMOLITE 1.5 CAL)  1,000 mL Per Tube Q24H  . feeding supplement (PRO-STAT SUGAR FREE 64)  60 mL Per Tube TID  . gabapentin  300 mg Per Tube Q12H  . insulin aspart  0-9 Units Subcutaneous Q4H  . ipratropium-albuterol  3 mL Nebulization BID  . levothyroxine  125 mcg Per Tube QAC breakfast  . lidocaine  1 patch Transdermal Q24H  . mouth rinse  15 mL Mouth Rinse QID  . midodrine  15 mg Per Tube TID WC  . multivitamin  1 tablet Oral QHS  . pantoprazole sodium  40 mg Per Tube Daily  . scopolamine  1 patch Transdermal Q72H    Continuous Infusions: . sodium chloride 10 mL/hr at 05/16/16 1218  . sodium chloride 10 mL/hr at 06/03/16 2000  . sodium chloride 10 mL/hr (06/20/16 0914)  . sodium chloride    . sodium chloride    . acyclovir    . ampicillin-sulbactam (UNASYN) IV    . anticoagulant sodium citrate      PRN Meds: Place/Maintain arterial line **AND** sodium chloride, sodium chloride, sodium chloride, albuterol, alteplase, docusate, fentaNYL (SUBLIMAZE) injection, heparin, heparin, ipratropium-albuterol, lidocaine (PF), lidocaine-prilocaine, metoprolol, naLOXone (NARCAN)  injection, pentafluoroprop-tetrafluoroeth, RESOURCE THICKENUP CLEAR, sodium chloride flush  Physical Exam       Well developed male, does not open his eyes or relax his head back.  He does not speak. Cortrak n/g in place.  Left wrist in soft restraint. CV tachycardic and irreg irreg.  Shingles across anterior chest. resp regular but somewhat labored. abd obese soft, nt, nd Lower ext in prevlon boots.   Vital Signs: BP (!) 75/48 (BP Location:  Left Wrist)   Pulse (!) 114   Temp 99 F (37.2 C) (Axillary)   Resp (!) 26   Ht '5\' 6"'  (1.676 m)   Wt (!) 197 kg (434 lb 4.9 oz)   SpO2 93%   BMI 70.10 kg/m  SpO2: SpO2: 93 % O2 Device: O2 Device: Tracheostomy Collar O2 Flow Rate: O2 Flow Rate (L/min): 12 L/min  Intake/output summary:   Intake/Output Summary (Last 24 hours) at 06/20/16 0956 Last data filed at 06/20/16 0000  Gross per 24 hour  Intake             1450 ml  Output              450 ml  Net             1000 ml   LBM: Last BM Date: 06/19/16 Baseline Weight: Weight: (!) 170.1 kg (375 lb) Most recent weight: Weight: (!) 197 kg (434 lb 4.9 oz)       Palliative Assessment/Data:    Flowsheet Rows     Most Recent Value  Intake Tab  Referral Department  Hospitalist  Unit at Time of Referral  ICU  Palliative Care Primary Diagnosis  Sepsis/Infectious Disease  Date Notified  06/09/16  Palliative Care Type  New Palliative care  Reason for referral  Pain, Non-pain Symptom, Clarify Goals of Care  Date of Admission  06/02/2016  Date first seen by Palliative Care  06/09/16  # of days Palliative referral response time  0 Day(s)  # of days IP prior to Palliative referral  1  Clinical Assessment  Palliative Performance Scale Score  10%  Pain Max last 24 hours  8  Pain Min Last 24 hours  5  Dyspnea Max Last 24 Hours  Not able to report  Dyspnea Min Last 24 hours  Not able to report  Nausea Max Last 24 Hours  Not able to report  Nausea Min Last 24 Hours  Not able to report  Anxiety Max Last 24 Hours  Not able to report  Anxiety Min Last 24 Hours  Not able to report  Other Max Last 24 Hours  Not able to report  Psychosocial & Spiritual Assessment  Palliative Care Outcomes  Patient/Family meeting held?  Yes  Who was at the meeting?  grand daughter (medical decision maker), and daughter  Palliative Care Outcomes  Clarified goals of care  Palliative Care follow-up planned  Yes, Facility      Patient Active Problem List    Diagnosis Date Noted  . Dysphagia   . Encounter for orogastric (OG) tube placement   . Encounter for orogastric tube placement   . Arm pain   . Pleural effusion   . Abdominal pain   . FUO (fever of unknown origin)   . Fracture of proximal humerus   . Pressure injury of skin 06/11/2016  . Tracheostomy care ([place])   . Stage 5 chronic kidney disease on chronic dialysis ([place])   . HCAP (healthcare-associated pneumonia)   . Palliative care by specialist   . Palliative care encounter   . Acute respiratory failure with hypercapnia ([place])   . AKI (acute kidney injury) ([place])   . Hepatic lesion 05/22/2016  . Shock liver 05/17/2016  . Current use of proton pump inhibitor 05/17/2016  . Contraindication to deep vein thrombosis (DVT) prophylaxis 05/17/2016  . Retroperitoneal hemorrhage 05/15/2016  . Goals of care, counseling/discussion 05/15/2016  . On enteral nutrition 05/15/2016  . Acute renal failure (ARF) ([place]) 05/15/2016  . Electrolyte imbalance 05/15/2016  . Mental status change   . Acute congestive heart failure ([place])   . Shock circulatory ([place])   . Acute respiratory failure with hypoxemia ([place]) 05/23/2016  . Thrombocytopenia ([place]) 05/04/2014  . 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 Hemiparesis affecting right side as late effect of cerebrovascular accident ([place]) 09/11/2013  . Hypothyroidism 09/11/2013  . Cerebrovascular accident with involvement of right side of body ([place]) 04/17/2013  . Allergic rhinitis 06/06/2012  . Gout 06/06/2012  . Osteoarthrosis and allied disorders   . Hyperlipidemia   . Vitamin D deficiency   . Hypertension   . Asthma   . Paroxysmal atrial fibrillation ([place]) 03/10/2009  . OBESITY, MORBID 03/02/2009    Palliative Care Plan    Recommendations/Plan:  In addition to fentanyl for SOB and pain, will add lidoderm TD near shingles and low dose gabapentin bid.  Scopolamine TD for excess secretions  Code Status:  DNR  Prognosis:  < 2  weeks - probably only days when dialysis is discontinued and comfort care is chosen.    Discharge Planning:  Anticipated Hospital Death  Care plan was discussed with bedside RN, Dr. Wynetta Emery, Nephrology, patient's family.  Thank you for allowing the Palliative Medicine Team to assist in the care of this patient.  Total time spent: 35 min.     Greater than 50%  of this time was spent counseling and coordinating care related to the above assessment and plan.  [name], PA-C Palliative Medicine  Please contact Palliative MedicineTeam phone at [phone] for questions and concerns between 7 am - 7 pm.   Please see AMION for individual provider pager numbers.

## 2016-06-20 NOTE — Progress Notes (Addendum)
Dr. Wynetta Emery and I met with the family at bedside and then privately in a conference room.   Omar [name] were joined by their SO and husband respectively.  Their pastor was also present.  The family made it clear that they understand he is not going to improve.  They would like him to be made as comfortable as possible.  They understand that his prognosis is hours to days.  We discussed removing the n/g tube, eliminating restraints and stopping all measures that are not related to his comfort.  After the fentanyl has had overnight to infuse we can begin to slowly wean him from oxygen 4/18.  If he stabilizes and is comfortable tomorrow a transfer to [facility] is reasonable.   Given his severe pain, broken arm, hematoma, and shingles, I do not believe a transfer to Hospice house would be possible.  [name], [place] Palliative Medicine Pager: [phone] Palliative Office [phone]

## 2016-06-20 NOTE — Progress Notes (Signed)
[facility] Progress Note    Assessment/ Plan:   1 AKI oliguric.- we are not able to dialyze with IHD due to hemodynamic instability.  If GOC are to be fully aggressive, then will need to be transferred back to ICU setting for pressor and CRRT.  Ongoing family discussions being held.  Appreciate greatly primary team and palliative care's efforts. 2 Resp insuff with trach- this will make discharge to outpatient dialysis facility nearly impossible  3.  Septic shock 2/2 strep viridans- on antibiotics, R IJ dialysis catheter removed 4.  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  Afib with RVR- exacerbated by  6 Morbid obesity 7 CVA 8 Arm fx 9 RP bleed 10 Anemia esa,  11 Nutrition needs PEG 12 dispo: ongoing [place]  Subjective:    Unable to do dialysis 4/16 given hemodynamic instability.  Family meeting yesterday, DNR.  Appears more comfortable today as he is getting some fentanyl   Objective:   BP (!) 88/56 (BP Location: Left Wrist)   Pulse (!) 114   Temp 98.6 F (37 C) (Axillary)   Resp (!) 23   Ht 5' 6" (1.676 m)   Wt (!) 197 kg (434 lb 4.9 oz)   SpO2 100%   BMI 70.10 kg/m   Intake/Output Summary (Last 24 hours) at 06/20/16 1357 Last data filed at 06/20/16 1000  Gross per 24 hour  Intake             1010 ml  Output              450 ml  Net              560 ml   Weight change:   Physical Exam: Gen: ill appearin [id number], irregular Resp: tachypneic, trach in place, some secretions (foamy) [id number] Ext: edema  Imaging: Ct Head Wo Contrast  Result Date: 06/18/2016 CLINICAL DATA:  Mental status change.  Inpatient. EXAM: CT HEAD WITHOUT CONTRAST TECHNIQUE: Contiguous axial images were obtained from the base of the skull through the vertex without intravenous contrast. COMPARISON:  06/04/2016 head CT. FINDINGS: Motion degraded scan. Brain: No evidence of parenchymal hemorrhage or extra-axial fluid collection. No mass lesion, mass effect, or  midline shift. No CT evidence of acute infarction. Generalized cerebral volume loss. Nonspecific mild to moderate subcortical and periventricular white matter hypodensity, most in keeping with chronic small vessel ischemic change. No ventriculomegaly. Vascular: Intracranial atherosclerosis.  No acute abnormality. Skull: No evidence of calvarial fracture. Sinuses/Orbits: Mucoperiosteal thickening in the bilateral ethmoidal air cells, right frontal sinus and sphenoid sinuses. No fluid levels. Other: Partial bilateral mastoid effusions, left greater than right. Right nasal route tube enters the hypopharynx on the scout tomograms. IMPRESSION: 1.  No evidence of acute intracranial abnormality. 2. Generalized cerebral volume loss and chronic small vessel ischemia. 3. Paranasal sinusitis. 4. Bilateral partial mastoid effusions. Electronically Signed   By: Ilona Sorrel M.D.   On: 06/18/2016 23:16    Labs: BMET  Recent Labs Lab 06/16/16 0311 06/17/16 0500 06/17/16 2304 06/18/16 0839 06/18/16 0930 06/18/16 2030 06/19/16 0548 06/20/16 0150  NA 130* 132* 130* 130* 130* 132* 132* 131*  K 4.8 3.8 4.0 4.0 4.1 4.1 4.3 4.4  CL 98* 98* 96* 95* 96* 95* 94* 94*  CO2 _0 GLUCOSE 102* 115* 110* 101* 104* 101* 102* 111*  BUN 65* 41* 57* 77* 79* 84* 93* 107*  CREATININE 4.83* 3.61* 4.56* 5.00* 5.19* 5.74* 6.11* 7.22*  CALCIUM 7.9* 7.9* 8.1* 8.2* 8.1* 8.4* 8.3* 8.3*  PHOS 5.9* 6.8* 5.6* 7.3*  --  6.9* 9.0* 9.9*   CBC  Recent Labs Lab 06/17/16 2304 06/18/16 0930 06/18/16 2030 06/19/16 0548 06/20/16 0150  WBC 3.9* 4.3 3.5* 4.3 4.5  NEUTROABS 0.2*  --   --   --   --   HGB 7.7* 8.4* 8.6* 8.3* 8.3*  HCT 24.2* 27.0* 27.4* 27.2* 26.9*  MCV 111.0* 111.1* 110.9* 111.5* 110.7*  PLT 225 263 290 300 311    Medications:    . amiodarone  400 mg Oral BID  . budesonide (PULMICORT) nebulizer solution  0.5 mg Nebulization BID  . chlorhexidine gluconate (MEDLINE KIT)  15 mL Mouth Rinse BID   . [START ON 06/20/2016] darbepoetin (ARANESP) injection - DIALYSIS  200 mcg Intravenous Q Wed-HD  . feeding supplement (OSMOLITE 1.5 CAL)  1,000 mL Per Tube Q24H  . feeding supplement (PRO-STAT SUGAR FREE 64)  60 mL Per Tube TID  . gabapentin  300 mg Per Tube Q12H  . insulin aspart  0-9 Units Subcutaneous Q4H  . ipratropium-albuterol  3 mL Nebulization BID  . levothyroxine  125 mcg Per Tube QAC breakfast  . lidocaine  1 patch Transdermal Q24H  . mouth rinse  15 mL Mouth Rinse QID  . midodrine  15 mg Per Tube TID WC  . multivitamin  1 tablet Oral QHS  . pantoprazole sodium  40 mg Per Tube Daily  . scopolamine  1 patch Transdermal Q72H       , MD 06/20/2016, 1:57 PM  

## 2016-06-20 NOTE — Progress Notes (Signed)
06/20/2016 3:32 PM   PROGRESS NOTE  Omar [name]  VOJ:[id number] DOB: 24-May-1945 DOA: 05/23/2016 PCP: Redge Gainer, MD  Brief Narrative:   71 y/o male from [place] admitted 3/5 with altered mental status + H.flu PNA with aspiration, sepsis, hypoxic/hypercapnic respiratory failure. Course c/b retroperitoneal bleed while on heparin with subsequent shock, acute renal failure & prolonged ventilation requiring tracheostomy.  On CRRT intermittently through hosp and has not tolerated HD due to hypotension.  He has had a tunneled HD catheter placed.  He continues to have significant hypotension preventing placement of permanent feeding tube. Started on dys I diet per SLP 4/10.  Palliative care is following.  Pt was briefly started on hemodialysis but not tolerated well due to hypotension.       Antibiotics: Zosyn 3/06 >> 3/08 Rocephin (H.Flu PNA) 3/08 >> 3/14 Vancomycin 3/23 >> 3/28 Cefepime 3/23 >> 4/1 Meropenem 4/1 >> Vancomycin 4/3 >> 4/4  Zovirax 4/11  Cultures: Urine 3/05 >> negative Blood 3/06 >> negative Resp 3/6 >> H flu (b-lactamase +) Trach stoma site 3/23 >> diphtheroids Resp cx 3/28 >>nl flora Blood 4/1 NGTD Urine 4/10: yeast Blood 4/14: 1/2 coag neg staph  Lines/tubes: ETT 3/05 >> 3/8 ETT 3/10 >> 3/21 Trach (Dr Redmond Baseman) 3/21 >>  Lt IJ CVL 3/07 >> 3/27 R HD catheter  >> 3/22 R IJ HD catheter 3/23 >>   Events: 3/05  Transfer to [facility] Sputum 3/06 >> Haemophilus influenzae (beta lactamase positive) Echo 3/06 >> EF 65 to 70%, mod LVH, mod TR, PAS 39 mmHg CT head 3/7 >> no acute abnormality  3/10 - Large retroperitoneal bleed noted on CT after c/o back pain/leg pain 3/27 rt humerus fracture identified - ortho consult 3/27 RUQ [place] .Marland Kitchen Not helpful due to body habitus. On ATC with copious secrtions.  3/29 - Chronic  critically ill 3/27 - end CRRT 3/30 - doing ATC x 24-48h per RN. Rt shoulder pain only when touched per RN. Making urine. Off CRRT. Not on pressors. Family not fully  on board about LTAC per RN but looking at [place]. Needs LTAC.  4/2 - resumed CRRT 4/3 - Family agreeable to PEG tube 4/5 - G-tube to be placed by gen surg however anesthesia refused 2/2 hypotension.  4/6 - restarted HD. Tolerated.  4/7 - transferred to SDU 4/8 - tunneled HD catheter by IR 4/9 - recurrent sepsis/fevers 4/10 - shingles outbreak 4/14 - CT chest/abd/pelvis ID consult 4/15 - Cardiology consult  Assessment & Plan:    Active Problems:   Paroxysmal atrial fibrillation (HCC)   Hemiparesis affecting right side as late effect of cerebrovascular accident [facility])   Hypothyroidism   Acute respiratory failure with hypoxemia (HCC)   Shock circulatory (HCC)   Mental status change   Retroperitoneal hemorrhage   Goals of care, counseling/discussion   On enteral nutrition   Acute renal failure (ARF) (HCC)   Electrolyte imbalance   Shock liver   Current use of proton pump inhibitor   Contraindication to deep vein thrombosis (DVT) prophylaxis   Hepatic lesion   Palliative care encounter   Acute respiratory failure with hypercapnia (HCC)   AKI (acute kidney injury) ([place])   Stage 5 chronic kidney disease on chronic dialysis ([place])   HCAP (healthcare-associated pneumonia)   Palliative care by specialist   Tracheostomy care ([place])   Pressure injury of skin   Abdominal pain   FUO (fever of unknown origin)   Fracture of proximal humerus   Arm pain   Pleural  effusion   Dysphagia   Encounter for orogastric (OG) tube placement   Encounter for orogastric tube placement   Shingles  Acute on chronic hypoxemic respiratory failure, improved.   Pneumonia, H influenza (beta lactamase positive), treated New HCAP on 4/1, treated Pulm edema, improved Presumed OSA/OHS History of asthma Cont ATC, keep o2 sats > 88% Off vent since 4/2 duoneb qid + pulmicort BID Completed a course of meropenem on 4/8 for HCAP Now on unasyn for 8 days for recurrent aspiration pneumonia, unasyn started  4/15 Follow up with ENT and trach clinic outpatient, continue regular trach care, added scopolamine and robinol for copious secretions  Septic and hemorrhagic shock, resolved. Off pressors 3/31 but had been intermittently hypotensive, slowly improving Paroxysmal atrial fibrillation History of hypertension Off stress dose steroids, continue to hold anti-hypertensives. Continue midodrine to 65m TID (started on 4/5 by nephrology) Blood cultures 4/15: coag neg staph Repeat CXR 4/14: probable aspiration pneumonia Tracheal aspirate culture G+ cocci in pair, few G - rods Varicella zoster PCR positive - on zovirax per pharmacy dosing for full 2 weeks Contact and Airborne precautions  Vesicular/bullous rash on right chest, consider metal allergy from the sling he has been wearing.  DDx also include shingles.  T4 dermatome and has a large ulcerated area on his back in same dermatomal distribution -  Varicella PCR positive -  Cover metal ring with tape -  Airborne and contact precautions until lesions dried and crusted - started acyclovir 4/11   Fever - unknown source - asked for ID consult for assistance. CT chest abd pelvis obtained 4/14:  left-sided airspace opacities are suspicious for infection or aspiration, no explanation for fever seen in Abd/pelvis.   Pulling out dialysis lines as possible, culture tip, removed the right IJ 4/16.  Now on unasyn and vancomycin per ID. Asked IR to remove tunneled dialysis catheter.     Mental status change - CT head 4/15 negative for acute findings.      New ESRD, Acute renal failure, presumed due to ATN in the setting of hemorrhagic shock. Pt intermittently on CRRT and  Currently on HD since 4/6 HD MWF Midodrine increasedbut still having soft BPs Tunneled HD catheter placed on 4/9 by IR may need to be removed secondary to fevers.   Large retroperitoneal bleed noted 3/10, some slight increased 3/18 CT scan. Hb has been stable.   Shock liver improving.   Hepatic lesion; noted on CT scan of the abdomen 3/18. 5 cm lesion of the dome of the liver, could represent blood or infection or infarct.  Protein calorie malnutrition Continue PPI Continue tube feeds, not mentally alert enough to eat now. PEG/G tube unable to be placed.  IR unable to place 2/2 complicated anatomy. Gen surg unable to place 2/2 hypotension.  Acute blood loss anemia superimposed on anemia of chronic kidney disease, improved Severe heparin sensitivity, associated with hemorrhage Thrombocytopenia, acute on chronic Thrombocytopenia improving Follow CBC, Goal hemoglobin greater than 7.0.  No recent bleeding or transfusion.  No heparin for now.  Hold anticoagulation due to retroperitoneal bleed SCD for DVT prophylaxis  H. influenzae pneumonia and HCAP treatment completed Ce[facility], started 3/23, [facility] possible [facility].  Completed meropenem on 4/8.   Hyperglycemia, much improved and stable  Hypothyroidism, stable, continue synthroid  Metabolic uremic encephalopathy, still with signs of encephalopathy History of CVA with right hemiplegia Rt humerus fracture  Mentation improving PT consult - LTAC recommended.  RUE sling fentanyl prn pain  FAMILY DISCUSSIONS: Met with POA and her BF  5pm 4/16 with Haynes Dage from palliative care team for goals care and update of condition. We discussed his long hospitalization and they expressed their frustrations with the events that have occurred.  We then focused on patient and what is in his best interest and what their wishes were for him.  They do not want him to suffer and they see that he is in pain and suffering.  I expressed to them my concern that his condition is not reversible and that he is dying and likely not going to have any meaningful improvement.  I spoke with them about my conversation with critical care triage physician Dr. Lyman Bishop at [facility] and that he was concerned that transferring patient to  another facility would cause patient needless pain and suffering and that they would not be able to provide a level of care that is different or would have a different outcome than what we are doing for him now.  They said that they would not want him any more pain and suffering.  Granddaughter decided to make him DNR. She did not want chest compressions and extraordinary measures that would certainly cause him more pain and suffering.  She said that she would never allow her grandfather to go to [facility].  She asked if I would call her mother and speak with her and I was able to do that this morning.  She said that she would like for her dad to be kept comfortable and she did not want him to have more pain and suffering.  She called me back and told me that she wanted to remove all extraneous lines, etc including the dialysis lines since he is not going to have more dialysis.  I plan to meet with them late today at 5:30 pm with daughter and granddaughter.     Dr. Corrie Dandy spoke with granddaughter and updated her of pts condition that he was transferred to SDU.   Family requested [facility] transfer and patient is on list if bed becomes available.  I called them 4/14 for update and they said they would check on it and call me back but they never called me back.  Plan for LTAC, [facility] expressed that they might be able to take him.  They have [place] kidney coverage over at [place].       DVT prophylaxis:  SCDs Code Status:  full Family Communication:   Disposition Plan:  LTAC  Consultants:   General surgery  Nephrology  ENT  Orthopedic surgery, Dr. Jacqulynn Cadet  Palliative care  Interventional radiology  Infectious Disease  Antimicrobials:  Anti-infectives    Start     Dose/Rate Route Frequency Ordered Stop   06/19/16 2200  acyclovir (ZOVIRAX) 500 mg in dextrose 5 % 100 mL IVPB     5 mg/kg  100 kg (Adjusted) 110 mL/hr over 60 Minutes Intravenous Every 24 hours 06/18/16 1135     06/19/16  2200  Ampicillin-Sulbactam (UNASYN) 3 g in sodium chloride 0.9 % 100 mL IVPB     3 g 200 mL/hr over 30 Minutes Intravenous Daily at bedtime 06/19/16 1139 06/26/16 2159   06/19/16 1215  vancomycin (VANCOCIN) IVPB 1000 mg/200 mL premix     1,000 mg 200 mL/hr over 60 Minutes Intravenous Every M-W-F (Hemodialysis) 06/19/16 1207 06/19/16 1315   06/19/16 1200  vancomycin (VANCOCIN) IVPB 1000 mg/200 mL premix  Status:  Discontinued     1,000 mg 200 mL/hr over 60  Minutes Intravenous Every M-W-F (Hemodialysis) 06/16/16 1255 06/18/16 1327   06/18/16 1500  Ampicillin-Sulbactam (UNASYN) 3 g in sodium chloride 0.9 % 100 mL IVPB  Status:  Discontinued     3 g 200 mL/hr over 30 Minutes Intravenous Daily at bedtime 06/18/16 1408 06/19/16 1139   06/18/16 1230  acyclovir (ZOVIRAX) 500 mg in dextrose 5 % 100 mL IVPB     5 mg/kg  100 kg (Adjusted) 110 mL/hr over 60 Minutes Intravenous  Once 06/18/16 1125 06/18/16 1330   06/16/16 2200  acyclovir (ZOVIRAX) tablet 600 mg  Status:  Discontinued     600 mg Oral Once per day on Mon Wed Fri 06/15/16 1126 06/15/16 1145   06/16/16 2200  acyclovir (ZOVIRAX) 200 MG capsule 600 mg  Status:  Discontinued     600 mg Oral Once per day on Mon Wed Fri 06/15/16 1443 06/17/16 1326   06/16/16 2000  ceftAZIDime (FORTAZ) IVPB 2 g  Status:  Discontinued     2 g 100 mL/hr over 30 Minutes Intravenous Every M-W-F (2000) 06/15/16 1438 06/16/16 1652   06/16/16 2000  cefTAZidime (FORTAZ) 2 g in dextrose 5 % 50 mL IVPB  Status:  Discontinued     2 g 100 mL/hr over 30 Minutes Intravenous Every M-W-F (2000) 06/16/16 1652 06/18/16 0947   06/16/16 1200  vancomycin (VANCOCIN) IVPB 750 mg/150 ml premix     750 mg 150 mL/hr over 60 Minutes Intravenous Every Fri (Hemodialysis) 06/16/16 1157 06/16/16 1421   06/16/16 1000  acyclovir (ZOVIRAX) 200 MG capsule 600 mg  Status:  Discontinued     600 mg Oral Once per day on Mon Wed Fri 06/15/16 1145 06/15/16 1443   06/16/16 1000  acyclovir (ZOVIRAX)  tablet 200 mg  Status:  Discontinued     200 mg Oral Daily 06/15/16 1435 06/16/16 0415   06/16/16 1000  acyclovir (ZOVIRAX) 200 MG capsule 200 mg  Status:  Discontinued     200 mg Oral Daily 06/16/16 0416 06/17/16 1326   06/15/16 2200  acyclovir (ZOVIRAX) 200 MG capsule 200 mg  Status:  Discontinued     200 mg Oral 2 times daily 06/15/16 0801 06/15/16 1126   06/15/16 2200  acyclovir (ZOVIRAX) tablet 200 mg  Status:  Discontinued     200 mg Oral Once per day on Sun Tue Thu Sat 06/15/16 1126 06/15/16 1144   06/15/16 2200  acyclovir (ZOVIRAX) 200 MG capsule 200 mg  Status:  Discontinued     200 mg Oral Once per day on Sun Tue Thu Sat 06/15/16 1144 06/17/16 1326   06/15/16 1200  acyclovir (ZOVIRAX) 200 MG capsule 200 mg  Status:  Discontinued     200 mg Oral Daily 06/15/16 1141 06/15/16 1435   06/15/16 1130  acyclovir (ZOVIRAX) tablet 400 mg     400 mg Oral  Once 06/15/16 1117 06/15/16 1446   06/15/16 1130  Ceftazidime (FORTAZ) injection 2 g  Status:  Discontinued     2 g Intravenous  Once 06/15/16 1117 06/15/16 1118   06/15/16 1130  cefTAZidime (FORTAZ) 2 g in dextrose 5 % 50 mL IVPB     2 g 100 mL/hr over 30 Minutes Intravenous  Once 06/15/16 1122 06/15/16 1240   06/15/16 1130  acyclovir (ZOVIRAX) tablet 200 mg  Status:  Discontinued     200 mg Oral  Every morning - 10a 06/15/16 1126 06/15/16 1141   06/15/16 0900  cefTAZidime (FORTAZ) 2 g in dextrose 5 % 50 mL  IVPB  Status:  Discontinued     2 g 100 mL/hr over 30 Minutes Intravenous  Once 06/15/16 0720 06/15/16 1117   06/15/16 0900  acyclovir (ZOVIRAX) 200 MG capsule 400 mg  Status:  Discontinued     400 mg Oral  Once 06/15/16 0801 06/15/16 1117   06/15/16 0000  acyclovir (ZOVIRAX) 200 MG capsule 200 mg  Status:  Discontinued     200 mg Oral 2 times daily 06/14/16 2353 06/15/16 0801   06/14/16 2200  acyclovir (ZOVIRAX) tablet 200 mg  Status:  Discontinued     200 mg Oral 2 times daily 06/14/16 1533 06/14/16 2353   06/14/16 1800   cefTAZidime (FORTAZ) 2 g in dextrose 5 % 50 mL IVPB  Status:  Discontinued     2 g 100 mL/hr over 30 Minutes Intravenous Every M-W-F (Hemodialysis) 06/13/16 1338 06/15/16 1438   06/14/16 1600  acyclovir (ZOVIRAX) tablet 400 mg  Status:  Discontinued     400 mg Oral  Once 06/14/16 1533 06/15/16 0801   06/14/16 1200  vancomycin (VANCOCIN) IVPB 1000 mg/200 mL premix  Status:  Discontinued     1,000 mg 200 mL/hr over 60 Minutes Intravenous Every M-W-F (Hemodialysis) 06/13/16 1338 06/16/16 1157   06/13/16 1300  vancomycin (VANCOCIN) 2,000 mg in sodium chloride 0.9 % 500 mL IVPB     2,000 mg 250 mL/hr over 120 Minutes Intravenous  Once 06/13/16 1244 06/13/16 1645   06/13/16 1300  cefTAZidime (FORTAZ) 2 g in dextrose 5 % 50 mL IVPB     2 g 100 mL/hr over 30 Minutes Intravenous  Once 06/13/16 1244 06/13/16 1506   06/12/16 1448  ceFAZolin (ANCEF) 2-4 GM/100ML-% IVPB  Status:  Discontinued    Comments:  Shaaron Adler   : cabinet override      06/12/16 1448 06/12/16 1454   06/12/16 1432  ceFAZolin (ANCEF) 2-4 GM/100ML-% IVPB    Comments:  Laughlin, Amy   : cabinet override      06/12/16 1432 06/12/16 1436   06/06/2016 0900  meropenem (MERREM) 500 mg in sodium chloride 0.9 % 50 mL IVPB     500 mg 100 mL/hr over 30 Minutes Intravenous Every 24 hours 06/07/16 0955 06/11/16 1109   06/20/2016 0600  cefoTEtan (CEFOTAN) 2 g in dextrose 5 % 50 mL IVPB  Status:  Discontinued     2 g 100 mL/hr over 30 Minutes Intravenous To [facility] 06/07/16 1354 06/04/2016 1157   06/06/16 1200  vancomycin (VANCOCIN) 1,500 mg in sodium chloride 0.9 % 500 mL IVPB  Status:  Discontinued     1,500 mg 250 mL/hr over 120 Minutes Intravenous Every 24 hours 06/06/16 0818 06/07/16 0954   06/05/16 1000  meropenem (MERREM) 1 g in sodium chloride 0.9 % 100 mL IVPB  Status:  Discontinued     1 g 200 mL/hr over 30 Minutes Intravenous Every 12 hours 06/05/16 0926 06/07/16 0955   06/04/16 1400  vancomycin (VANCOCIN) 2,000 mg in  sodium chloride 0.9 % 500 mL IVPB     2,000 mg 250 mL/hr over 120 Minutes Intravenous  Once 06/04/16 1234 06/04/16 1737   06/04/16 1400  meropenem (MERREM) 500 mg in sodium chloride 0.9 % 50 mL IVPB  Status:  Discontinued     500 mg 100 mL/hr over 30 Minutes Intravenous Every 24 hours 06/04/16 1234 06/05/16 0926   05/30/16 2300  ceFEPIme (MAXIPIME) 1 g in dextrose 5 % 50 mL IVPB  Status:  Discontinued  1 g 100 mL/hr over 30 Minutes Intravenous Every 24 hours 05/30/16 1223 06/04/16 1231   05/27/16 1400  ceFEPIme (MAXIPIME) 2 g in dextrose 5 % 50 mL IVPB  Status:  Discontinued     2 g 100 mL/hr over 30 Minutes Intravenous Every 12 hours 05/27/16 1014 05/30/16 1223   05/27/16 1400  vancomycin (VANCOCIN) 1,500 mg in sodium chloride 0.9 % 500 mL IVPB  Status:  Discontinued     1,500 mg 250 mL/hr over 120 Minutes Intravenous Every 24 hours 05/27/16 1014 05/30/16 1224   05/26/16 1300  ceFEPIme (MAXIPIME) 1 g in dextrose 5 % 50 mL IVPB  Status:  Discontinued     1 g 100 mL/hr over 30 Minutes Intravenous Every 24 hours 05/26/16 1225 05/27/16 1014   05/26/16 1230  vancomycin (VANCOCIN) 2,500 mg in sodium chloride 0.9 % 500 mL IVPB     2,500 mg 250 mL/hr over 120 Minutes Intravenous  Once 05/26/16 1225 05/26/16 1530   05/16/16 1400  cefTRIAXone (ROCEPHIN) 2 g in dextrose 5 % 50 mL IVPB     2 g 100 mL/hr over 30 Minutes Intravenous Every 24 hours 05/15/16 1649 05/17/16 1458   05/11/16 1400  cefTRIAXone (ROCEPHIN) 1 g in dextrose 5 % 50 mL IVPB  Status:  Discontinued     1 g 100 mL/hr over 30 Minutes Intravenous Every 24 hours 05/11/16 0833 05/15/16 1649   05/09/16 1400  piperacillin-tazobactam (ZOSYN) IVPB 3.375 g  Status:  Discontinued     3.375 g 12.5 mL/hr over 240 Minutes Intravenous Every 8 hours 05/09/16 1114 05/11/16 0833     Subjective:  Patient not responsive this morning but appears more comfortable, did not see spontaneous grimacing as I did yesterday  Review of systems: Pt  unable to answer because of current condition   Objective: Vitals:   06/20/16 1153 06/20/16 1200 06/20/16 1216 06/20/16 1400  BP: (!) 87/56 (!) 95/56 (!) 88/56 (!) 83/52  Pulse:  (!) 119 (!) 114 (!) 110  Resp:  20 (!) 23 (!) 24  Temp:   98.6 F (37 C)   TempSrc:   Axillary   SpO2:  100% 100% 95%  Weight:      Height:        Intake/Output Summary (Last 24 hours) at 06/20/16 1532 Last data filed at 06/20/16 1000  Gross per 24 hour  Intake              930 ml  Output                0 ml  Net              930 ml   Filed Weights   06/20/16 0300  Weight: (!) 197 kg (434 lb 4.9 oz)    Examination:  General exam:  trach collar in place with copious secretions lethargic, difficult to arouse HEENT:  MMM, trach collar in place with copious secretions Respiratory system:  BBS with scattered rales Cardiovascular system: tachycardic, normal S1/S2.  Warm extremities Gastrointestinal system: Hyperactive bowel sounds, soft, nondistended, nontender. MSK:  Decreased bulk and strength.  Right arm in sling, very tender to palpation as patient grimaces.  2+ pitting bilateral lower extremity edema.  Neuro:  3 out of 5 strength of the right upper and right lower extremities Skin:  Herpetic rash on right chest wall and back drying and crusting.  Has a large ulcerated area on his back in same distribution.  No vesicles under  right arm seem.    GU:  Pannus yeast seen intertrigo  Data Reviewed: I have personally reviewed following labs and imaging studies  CBC:  Recent Labs Lab 06/17/16 2304 06/18/16 0930 06/18/16 2030 06/19/16 0548 06/20/16 0150  WBC 3.9* 4.3 3.5* 4.3 4.5  NEUTROABS 0.2*  --   --   --   --   HGB 7.7* 8.4* 8.6* 8.3* 8.3*  HCT 24.2* 27.0* 27.4* 27.2* 26.9*  MCV 111.0* 111.1* 110.9* 111.5* 110.7*  PLT 225 263 290 300 982   Basic Metabolic Panel:  Recent Labs Lab 06/17/16 2304 06/18/16 0839 06/18/16 0930 06/18/16 2030 06/19/16 0548 06/20/16 0150  NA 130* 130*  130* 132* 132* 131*  K 4.0 4.0 4.1 4.1 4.3 4.4  CL 96* 95* 96* 95* 94* 94*  CO2 '28 28 28 27 27 26  ' GLUCOSE 110* 101* 104* 101* 102* 111*  BUN 57* 77* 79* 84* 93* 107*  CREATININE 4.56* 5.00* 5.19* 5.74* 6.11* 7.22*  CALCIUM 8.1* 8.2* 8.1* 8.4* 8.3* 8.3*  PHOS 5.6* 7.3*  --  6.9* 9.0* 9.9*   GFR: Estimated Creatinine Clearance: 15.3 mL/min (A) (by C-G formula based on SCr of 7.22 mg/dL (H)). Liver Function Tests:  Recent Labs Lab 06/18/16 0839 06/18/16 0930 06/18/16 2030 06/19/16 0548 06/20/16 0150  AST  --  25  --   --   --   ALT  --  7*  --   --   --   ALKPHOS  --  84  --   --   --   BILITOT  --  1.3*  --   --   --   PROT  --  7.1  --   --   --   ALBUMIN 1.9* 1.9* 1.9* 1.9* 2.0*   No results for input(s): LIPASE, AMYLASE in the last 168 hours. No results for input(s): AMMONIA in the last 168 hours. Coagulation Profile: No results for input(s): INR, PROTIME in the last 168 hours. Cardiac Enzymes: No results for input(s): CKTOTAL, CKMB, CKMBINDEX, TROPONINI in the last 168 hours. BNP (last 3 results) No results for input(s): PROBNP in the last 8760 hours. HbA1C: No results for input(s): HGBA1C in the last 72 hours. CBG:  Recent Labs Lab 06/19/16 2055 06/20/16 0044 06/20/16 0510 06/20/16 0730 06/20/16 1215  GLUCAP 100* 130* 122* 129* 151*   Lipid Profile: No results for input(s): CHOL, HDL, LDLCALC, TRIG, CHOLHDL, LDLDIRECT in the last 72 hours. Thyroid Function Tests: No results for input(s): TSH, T4TOTAL, FREET4, T3FREE, THYROIDAB in the last 72 hours. Anemia Panel: No results for input(s): VITAMINB12, FOLATE, FERRITIN, TIBC, IRON, RETICCTPCT in the last 72 hours. Urine analysis:    Component Value Date/Time   COLORURINE AMBER (A) 06/13/2016 0808   APPEARANCEUR CLOUDY (A) 06/13/2016 0808   LABSPEC 1.023 06/13/2016 0808   PHURINE 6.0 06/13/2016 0808   GLUCOSEU NEGATIVE 06/13/2016 0808   HGBUR LARGE (A) 06/13/2016 0808   BILIRUBINUR NEGATIVE 06/13/2016  0808   BILIRUBINUR small 10/06/2014 1612   KETONESUR NEGATIVE 06/13/2016 0808   PROTEINUR 100 (A) 06/13/2016 0808   UROBILINOGEN 2.0 10/06/2014 1612   UROBILINOGEN 4.0 (H) 05/30/2010 1511   NITRITE NEGATIVE 06/13/2016 0808   LEUKOCYTESUR MODERATE (A) 06/13/2016 0808    Recent Results (from the past 240 hour(s))  MRSA PCR Screening     Status: Abnormal   Collection Time: 06/11/16  4:18 AM  Result Value Ref Range Status   MRSA by PCR INVALID RESULTS, SPECIMEN SENT FOR CULTURE (A)  NEGATIVE Final    Comment: NOTIFIED RN JENNIFER AT [phone] 06/11/16 BY A.DAVIS  MRSA culture     Status: None   Collection Time: 06/11/16  4:18 AM  Result Value Ref Range Status   Specimen Description NASAL SWAB  Final   Special Requests NONE  Final   Culture NO MRSA DETECTED  Final   Report Status 06/14/2016 FINAL  Final  Culture, Urine     Status: Abnormal   Collection Time: 06/13/16  8:08 AM  Result Value Ref Range Status   Specimen Description URINE, RANDOM  Final   Special Requests Normal  Final   Culture >=100,000 COLONIES/mL YEAST (A)  Final   Report Status 06/14/2016 FINAL  Final  Culture, respiratory (NON-Expectorated)     Status: None   Collection Time: 06/13/16  9:04 AM  Result Value Ref Range Status   Specimen Description TRACHEAL ASPIRATE  Final   Special Requests Normal  Final   Gram Stain   Final    MODERATE WBC PRESENT,BOTH PMN AND MONONUCLEAR FEW GRAM POSITIVE COCCI IN PAIRS IN CLUSTERS RARE GRAM NEGATIVE RODS    Culture Consistent with normal respiratory flora.  Final   Report Status 06/15/2016 FINAL  Final  Culture, blood (Routine X 2) w Reflex to ID Panel     Status: None   Collection Time: 06/13/16  9:44 AM  Result Value Ref Range Status   Specimen Description BLOOD LEFT ARM  Final   Special Requests IN PEDIATRIC BOTTLE Blood Culture adequate volume  Final   Culture NO GROWTH 5 DAYS  Final   Report Status 06/18/2016 FINAL  Final  Culture, blood (Routine X 2) w Reflex to ID Panel      Status: None   Collection Time: 06/13/16 10:49 AM  Result Value Ref Range Status   Specimen Description BLOOD LEFT HAND  Final   Special Requests IN PEDIATRIC BOTTLE BCAV  Final   Culture NO GROWTH 5 DAYS  Final   Report Status 06/18/2016 FINAL  Final  Culture, blood (Routine X 2) w Reflex to ID Panel     Status: None (Preliminary result)   Collection Time: 06/17/16  9:45 AM  Result Value Ref Range Status   Specimen Description BLOOD RIGHT HAND  Final   Special Requests IN PEDIATRIC BOTTLE Blood Culture adequate volume  Final   Culture NO GROWTH 3 DAYS  Final   Report Status PENDING  Incomplete  Culture, blood (Routine X 2) w Reflex to ID Panel     Status: Abnormal   Collection Time: 06/17/16  9:49 AM  Result Value Ref Range Status   Specimen Description BLOOD RIGHT HAND  Final   Special Requests IN PEDIATRIC BOTTLE Blood Culture adequate volume  Final   Culture  Setup Time   Final    GRAM POSITIVE COCCI IN CLUSTERS IN PEDIATRIC BOTTLE CRITICAL RESULT CALLED TO, READ BACK BY AND VERIFIED WITH: PHARMD N GAZDA [id number] MLM    Culture (A)  Final    STAPHYLOCOCCUS SPECIES (COAGULASE NEGATIVE) THE SIGNIFICANCE OF ISOLATING THIS ORGANISM FROM A SINGLE SET OF BLOOD CULTURES WHEN MULTIPLE SETS ARE DRAWN IS UNCERTAIN. PLEASE NOTIFY THE MICROBIOLOGY DEPARTMENT WITHIN ONE WEEK IF SPECIATION AND SENSITIVITIES ARE REQUIRED.    Report Status 06/20/2016 FINAL  Final  Blood Culture ID Panel (Reflexed)     Status: Abnormal   Collection Time: 06/17/16  9:49 AM  Result Value Ref Range Status   Enterococcus species NOT DETECTED NOT DETECTED Final  Listeria monocytogenes NOT DETECTED NOT DETECTED Final   Staphylococcus species DETECTED (A) NOT DETECTED Final    Comment: Methicillin (oxacillin) resistant coagulase negative staphylococcus. Possible blood culture contaminant (unless isolated from more than one blood culture draw or clinical case suggests pathogenicity). No antibiotic treatment is  indicated for blood  culture contaminants. CRITICAL RESULT CALLED TO, READ BACK BY AND VERIFIED WITH: PHARMD N GAZDA [id number] MLM    Staphylococcus aureus NOT DETECTED NOT DETECTED Final   Methicillin resistance DETECTED (A) NOT DETECTED Final    Comment: CRITICAL RESULT CALLED TO, READ BACK BY AND VERIFIED WITH: PHARMD N GAZDA [id number] MLM    Streptococcus species NOT DETECTED NOT DETECTED Final   Streptococcus agalactiae NOT DETECTED NOT DETECTED Final   Streptococcus pneumoniae NOT DETECTED NOT DETECTED Final   Streptococcus pyogenes NOT DETECTED NOT DETECTED Final   Acinetobacter baumannii NOT DETECTED NOT DETECTED Final   Enterobacteriaceae species NOT DETECTED NOT DETECTED Final   Enterobacter cloacae complex NOT DETECTED NOT DETECTED Final   Escherichia coli NOT DETECTED NOT DETECTED Final   Klebsiella oxytoca NOT DETECTED NOT DETECTED Final   Klebsiella pneumoniae NOT DETECTED NOT DETECTED Final   Proteus species NOT DETECTED NOT DETECTED Final   Serratia marcescens NOT DETECTED NOT DETECTED Final   Haemophilus influenzae NOT DETECTED NOT DETECTED Final   Neisseria meningitidis NOT DETECTED NOT DETECTED Final   Pseudomonas aeruginosa NOT DETECTED NOT DETECTED Final   Candida albicans NOT DETECTED NOT DETECTED Final   Candida glabrata NOT DETECTED NOT DETECTED Final   Candida krusei NOT DETECTED NOT DETECTED Final   Candida parapsilosis NOT DETECTED NOT DETECTED Final   Candida tropicalis NOT DETECTED NOT DETECTED Final  Cath Tip Culture     Status: None (Preliminary result)   Collection Time: 06/19/16 12:45 PM  Result Value Ref Range Status   Specimen Description CATH TIP  Final   Special Requests NONE  Final   Culture CULTURE REINCUBATED FOR BETTER GROWTH  Final   Report Status PENDING  Incomplete  C difficile quick scan w PCR reflex     Status: None   Collection Time: 06/19/16  1:30 PM  Result Value Ref Range Status   C Diff antigen NEGATIVE NEGATIVE Final   C  Diff toxin NEGATIVE NEGATIVE Final   C Diff interpretation No C. difficile detected.  Final    Radiology Studies: Ct Head Wo Contrast  Result Date: 06/18/2016 CLINICAL DATA:  Mental status change.  Inpatient. EXAM: CT HEAD WITHOUT CONTRAST TECHNIQUE: Contiguous axial images were obtained from the base of the skull through the vertex without intravenous contrast. COMPARISON:  06/04/2016 head CT. FINDINGS: Motion degraded scan. Brain: No evidence of parenchymal hemorrhage or extra-axial fluid collection. No mass lesion, mass effect, or midline shift. No CT evidence of acute infarction. Generalized cerebral volume loss. Nonspecific mild to moderate subcortical and periventricular white matter hypodensity, most in keeping with chronic small vessel ischemic change. No ventriculomegaly. Vascular: Intracranial atherosclerosis.  No acute abnormality. Skull: No evidence of calvarial fracture. Sinuses/Orbits: Mucoperiosteal thickening in the bilateral ethmoidal air cells, right frontal sinus and sphenoid sinuses. No fluid levels. Other: Partial bilateral mastoid effusions, left greater than right. Right nasal route tube enters the hypopharynx on the scout tomograms. IMPRESSION: 1.  No evidence of acute intracranial abnormality. 2. Generalized cerebral volume loss and chronic small vessel ischemia. 3. Paranasal sinusitis. 4. Bilateral partial mastoid effusions. Electronically Signed   By: Ilona Sorrel M.D.   On: 06/18/2016 23:16  Scheduled Meds: . amiodarone  400 mg Oral BID  . budesonide (PULMICORT) nebulizer solution  0.5 mg Nebulization BID  . chlorhexidine gluconate (MEDLINE KIT)  15 mL Mouth Rinse BID  . [START ON 06-30-16] darbepoetin (ARANESP) injection - DIALYSIS  200 mcg Intravenous Q Wed-HD  . feeding supplement (OSMOLITE 1.5 CAL)  1,000 mL Per Tube Q24H  . feeding supplement (PRO-STAT SUGAR FREE 64)  60 mL Per Tube TID  . gabapentin  300 mg Per Tube Q12H  . insulin aspart  0-9 Units Subcutaneous  Q4H  . ipratropium-albuterol  3 mL Nebulization BID  . levothyroxine  125 mcg Per Tube QAC breakfast  . lidocaine  1 patch Transdermal Q24H  . mouth rinse  15 mL Mouth Rinse QID  . midodrine  15 mg Per Tube TID WC  . multivitamin  1 tablet Oral QHS  . pantoprazole sodium  40 mg Per Tube Daily  . scopolamine  1 patch Transdermal Q72H   Continuous Infusions: . sodium chloride 10 mL/hr at 05/16/16 1218  . sodium chloride 10 mL/hr at 06/03/16 2000  . sodium chloride 10 mL/hr (06/20/16 0914)  . acyclovir    . ampicillin-sulbactam (UNASYN) IV    . anticoagulant sodium citrate      LOS: 43 days   Time spent: 32 min  Irwin Brakeman, MD [facility] Pager [phone]  If 7PM-7AM, please contact night-coverage [email or web address] Password [facility] 06/20/2016, 3:32 PM

## 2016-06-20 NOTE — Progress Notes (Signed)
Progress Note  Patient Name: Omar [name] Date of Encounter: 06/20/2016  Primary Cardiologist: Dr. Percival Spanish  Subjective   Not responding to verbal stimuli. Remains hypotensive. No family at the bedside during this encounter.   Inpatient Medications    Scheduled Meds: . amiodarone  400 mg Oral BID  . budesonide (PULMICORT) nebulizer solution  0.5 mg Nebulization BID  . chlorhexidine gluconate (MEDLINE KIT)  15 mL Mouth Rinse BID  . [START ON 07/14/2016] darbepoetin (ARANESP) injection - DIALYSIS  200 mcg Intravenous Q Wed-HD  . feeding supplement (OSMOLITE 1.5 CAL)  1,000 mL Per Tube Q24H  . feeding supplement (PRO-STAT SUGAR FREE 64)  60 mL Per Tube TID  . insulin aspart  0-9 Units Subcutaneous Q4H  . ipratropium-albuterol  3 mL Nebulization BID  . levothyroxine  125 mcg Per Tube QAC breakfast  . mouth rinse  15 mL Mouth Rinse QID  . midodrine  15 mg Per Tube TID WC  . multivitamin  1 tablet Oral QHS  . pantoprazole sodium  40 mg Per Tube Daily   Continuous Infusions: . sodium chloride 10 mL/hr at 05/16/16 1218  . sodium chloride 10 mL/hr at 06/03/16 2000  . sodium chloride 10 mL/hr (06/20/16 0914)  . sodium chloride    . sodium chloride    . acyclovir    . ampicillin-sulbactam (UNASYN) IV    . anticoagulant sodium citrate     PRN Meds: Place/Maintain arterial line **AND** sodium chloride, sodium chloride, sodium chloride, acetaminophen (TYLENOL) oral liquid 160 mg/5 mL, acetaminophen, albuterol, alteplase, docusate, fentaNYL (SUBLIMAZE) injection, heparin, heparin, ipratropium-albuterol, lidocaine (PF), lidocaine-prilocaine, metoprolol, morphine injection, naLOXone (NARCAN)  injection, pentafluoroprop-tetrafluoroeth, RESOURCE THICKENUP CLEAR, sodium chloride flush   Vital Signs    Vitals:   06/20/16 0600 06/20/16 0731 06/20/16 0900 06/20/16 0901  BP: (!) 82/49 (!) 75/48    Pulse: (!) 119 (!) 124  (!) 114  Resp: (!) 28 16  (!) 26  Temp:  99 F (37.2 C)      TempSrc:  Axillary    SpO2: 91% 93% 91% 93%  Weight:      Height:        Intake/Output Summary (Last 24 hours) at 06/20/16 0936 Last data filed at 06/20/16 0000  Gross per 24 hour  Intake             1450 ml  Output              450 ml  Net             1000 ml   Filed Weights   06/20/16 0300  Weight: (!) 434 lb 4.9 oz (197 kg)    Telemetry    Atrial fibrillation, HR in 110's - 120's.  - Personally Reviewed  ECG    No new tracings.   Physical Exam   General: Morbidly obese African American male appearing in no acute distress. Nonverbal. Head: Normocephalic, atraumatic.  Neck: Supple without bruits, Trach in place Lungs:  Resp regular and unlabored, rhonchi throughout upper and lower lung fields. Heart: Irregularly irregular, S1, S2, no S3, S4, or murmur; no rub. Abdomen: Soft, non-tender, non-distended with normoactive bowel sounds. No hepatomegaly. No rebound/guarding. No obvious abdominal masses. Extremities: No clubbing, cyanosis, or edema. SCD's in place. Distal pedal pulses are 2+ bilaterally. Neuro: Not responsive to verbal stimuli.   Labs    Chemistry Recent Labs Lab 06/18/16 0930 06/18/16 2030 06/19/16 0548 06/20/16 0150  NA 130* 132* 132* 131*  K 4.1 4.1 4.3 4.4  CL 96* 95* 94* 94*  CO2 '28 27 27 26  ' GLUCOSE 104* 101* 102* 111*  BUN 79* 84* 93* 107*  CREATININE 5.19* 5.74* 6.11* 7.22*  CALCIUM 8.1* 8.4* 8.3* 8.3*  PROT 7.1  --   --   --   ALBUMIN 1.9* 1.9* 1.9* 2.0*  AST 25  --   --   --   ALT 7*  --   --   --   ALKPHOS 84  --   --   --   BILITOT 1.3*  --   --   --   GFRNONAA 10* 9* 8* 7*  GFRAA 12* 10* 9* 8*  ANIONGAP '6 10 11 11     ' Hematology Recent Labs Lab 06/18/16 2030 06/19/16 0548 06/20/16 0150  WBC 3.5* 4.3 4.5  RBC 2.47* 2.44* 2.43*  HGB 8.6* 8.3* 8.3*  HCT 27.4* 27.2* 26.9*  MCV 110.9* 111.5* 110.7*  MCH 34.8* 34.0 34.2*  MCHC 31.4 30.5 30.9  RDW 20.9* 20.8* 20.0*  PLT 290 300 311    Cardiac EnzymesNo results for  input(s): TROPONINI in the last 168 hours. No results for input(s): TROPIPOC in the last 168 hours.   BNPNo results for input(s): BNP, PROBNP in the last 168 hours.   DDimer No results for input(s): DDIMER in the last 168 hours.   Radiology    Ct Head Wo Contrast  Result Date: 06/18/2016 CLINICAL DATA:  Mental status change.  Inpatient. EXAM: CT HEAD WITHOUT CONTRAST TECHNIQUE: Contiguous axial images were obtained from the base of the skull through the vertex without intravenous contrast. COMPARISON:  06/04/2016 head CT. FINDINGS: Motion degraded scan. Brain: No evidence of parenchymal hemorrhage or extra-axial fluid collection. No mass lesion, mass effect, or midline shift. No CT evidence of acute infarction. Generalized cerebral volume loss. Nonspecific mild to moderate subcortical and periventricular white matter hypodensity, most in keeping with chronic small vessel ischemic change. No ventriculomegaly. Vascular: Intracranial atherosclerosis.  No acute abnormality. Skull: No evidence of calvarial fracture. Sinuses/Orbits: Mucoperiosteal thickening in the bilateral ethmoidal air cells, right frontal sinus and sphenoid sinuses. No fluid levels. Other: Partial bilateral mastoid effusions, left greater than right. Right nasal route tube enters the hypopharynx on the scout tomograms. IMPRESSION: 1.  No evidence of acute intracranial abnormality. 2. Generalized cerebral volume loss and chronic small vessel ischemia. 3. Paranasal sinusitis. 4. Bilateral partial mastoid effusions. Electronically Signed   By: Ilona Sorrel M.D.   On: 06/18/2016 23:16    Cardiac Studies   Echocardiogram: 05/2016 Study Conclusions  - Procedure narrative: Transthoracic echocardiography. Image   quality was suboptimal. The study was technically difficult, as a   result of body habitus. Intravenous contrast (Definity) was   administered. - Left ventricle: The cavity size was normal. Wall thickness was   increased in a  pattern of moderate LVH. Systolic function was   vigorous. The estimated ejection fraction was in the range of 65%   to 70%. Wall motion was normal; there were no regional wall   motion abnormalities. The study is not technically sufficient to   allow evaluation of LV diastolic function. - Left atrium: The atrium was normal in size. - Tricuspid valve: There was moderate regurgitation. - Pulmonary arteries: PA peak pressure: 39 mm Hg (S) + RAP. - Systemic veins: Not visualized.  Impressions:  - Technically difficult study. Definity contrast given. LVEF   65-70%, moderate LVH, normal wall motion, normal LA size,   moderate  TR, RVSP 39 mmHg + RAP  Patient Profile     71 y.o. male with PMH of atrial fibrillation, HTN, morbid obesity, and prior CVA who was admitted on 05/18/2016 for AMS found to have H.flu PNA with aspiration, sepsis, hypoxic/hypercapnic respiratory failure. Hospital course c/b retroperitoneal bleed while on heparin with subsequent shock, acute renal failure &prolonged ventilation requiring tracheostomy.On CRRT intermittently through hosp and has not tolerated HD due to hypotension. Cardiology consulted on 4/15 due to atrial fibrillation with RVR.  Assessment & Plan    1. Atrial fibrillation with RVR - HR initially in the 130's, with BP not allowing for addition of AV nodal blocking agents. He remains hypotensive with SBP in the 70's.  - started on PO Amiodarone 400 mg BID via tube on 4/16 with plans to decrease to 200 mg daily after 2 weeks.   - This patients CHA2DS2-VASc Score and unadjusted Ischemic Stroke Rate (% per year) is equal to 7.2 % stroke rate/year from a score of 5 (HTN, Age (2), prior CVA(2)). Not on anticoagulation secondary to recent retroperitoneal bleed.   2. Hflu/ PNA/ Septic Shock - per admitting team. Palliative Care following as well.   3. Acute renal failure  - creatinine at 7.22 this AM.  - Nephrology following.   4. Retroperitoneal  hemorrhage - all anticoagulation has been held. SCD's in place.    Signed, Erma Heritage , PA-C 9:36 AM 06/20/2016 Pager: [phone] As above, patient seen and examined. He remains unresponsive. He remains in atrial fibrillation with rapid ventricular response. Heart rate appears to be in the 110-120 range. Blood pressure will not allow beta blockade or calcium blocker. Continue amiodarone. Patient is known anticoagulation given retroperitoneal bleed. Prognosis appears to be poor and palliative care is following. Kirk Ruths, MD

## 2016-06-21 ENCOUNTER — Inpatient Hospital Stay (HOSPITAL_COMMUNITY): Payer: Medicare Other

## 2016-06-21 ENCOUNTER — Encounter (HOSPITAL_COMMUNITY): Payer: Self-pay | Admitting: Interventional Radiology

## 2016-06-21 DIAGNOSIS — Z515 Encounter for palliative care: Secondary | ICD-10-CM

## 2016-06-21 HISTORY — PX: IR REMOVAL TUN CV CATH W/O FL: IMG2289

## 2016-06-21 MED ORDER — FENTANYL BOLUS VIA INFUSION
75.0000 ug | INTRAVENOUS | Status: DC | PRN
  Filled 2016-06-21: qty 200

## 2016-06-22 LAB — CATH TIP CULTURE

## 2016-06-22 LAB — CULTURE, BLOOD (ROUTINE X 2)
CULTURE: NO GROWTH
SPECIAL REQUESTS: ADEQUATE

## 2016-06-23 ENCOUNTER — Ambulatory Visit: Payer: Medicare Other | Admitting: Family Medicine

## 2016-07-04 NOTE — Progress Notes (Signed)
No charge note.  Patient passed.  Grand daughter [facility]) was at bedside.  Family understands and is pleased he was comfortable.  Family is considering an autopsy and has requested information.  Algis Downs, [place] [facility] Pager: [phone]

## 2016-07-04 NOTE — Progress Notes (Signed)
Responded to page that pt on comfort care had passed. Took airborne precautions (pt had had shingles) after discussion w/ NP (also about possible family request for autopsy). Provided spiritual/emotional/ grief support and prayer to copasetic granddaughter, who was decisionmaker alone in rm when I entered - which she much appreciated. She said her grandmother, pt's wife, had passed 4 and 1/2 mos. ago and they were married 50 yrs. She is woman of faith and knows they are together. Pt's sister also came in and extended same to her. Provided pt placement info to granddaughter, who said she already knew where funeral would be and she soul tell nurse. Advised family that chaplain was available for f/u, especially if more family members arrived.    07/03/2016 1400  Clinical Encounter Type  Visited With Family;Health care provider  Visit Type Initial;Psychological support;Spiritual support;Social support;Death  Referral From Nurse  Spiritual Encounters  Spiritual Needs Grief support  Stress Factors  Family Stress Factors Loss   Ephraim Hamburger, [address]

## 2016-07-04 NOTE — Progress Notes (Signed)
Daily Progress Note   Patient Name: Omar [name]       Date: Jul 01, 2016 DOB: 1945-08-05  Age: 71 y.o. MRN#: [id number] Attending Physician: Omar Haff, MD Primary Care Physician: Omar Gainer, MD Admit Date: 05/31/2016  Reason for Consultation/Follow-up: Non pain symptom management, Pain control, Psychosocial/spiritual support, Terminal Care and Withdrawal of life-sustaining treatment  Subjective: Omar [name] is nonresponsive.  He looks comfortable.  Still on trach collar with a high level of oxygen.  I left [employer]) a voice mail to the effect that he looks very comfortable and at peace this morning.   Assessment: Prolonged complex difficult hospitalization.  Now actively dying. Family understands but needs support.  Will plan to wean oxygen slowly over the next 24 - 48 hours.  I anticipate he will pass very soon.   Patient Profile/HPI: 71 y.o.malewith past medical history of Obesity (current weight 340 pounds; albumin 1.7), atrial fib, gout, hypertension, multinodular goiter, osteoarthritis, hyperlipidemia, stroke (wheelchair bound at baseline), thrombocytopenia, vitamin D deficiencyadmitted from [facility] on 05/27/2016.Critically ill after testing positive for the flu, pneumonia, with altered mental status. Patient developed a retroperitoneal bleed secondary to heparin. He also developed acute on chronic renal failure and has been on CRRT. He has undergone a trach and has been intermittently on ventilator support. He now has a fractured right arm and is having severe pain from Shingles on his chest.     Length of Stay: 44  Current Medications: Scheduled Meds:  . chlorhexidine gluconate (MEDLINE KIT)  15 mL Mouth Rinse BID  . gabapentin  300 mg Per Tube Q12H  . lidocaine  1  patch Transdermal Q24H  . mouth rinse  15 mL Mouth Rinse QID  . scopolamine  1 patch Transdermal Q72H  . sodium chloride flush  3 mL Intravenous Q12H    Continuous Infusions: . sodium chloride 10 mL/hr at 05/16/16 1218  . sodium chloride 10 mL/hr at 06/03/16 2000  . sodium chloride 10 mL/hr (06/20/16 0914)  . sodium chloride    . fentaNYL infusion INTRAVENOUS 50 mcg/hr (06/20/16 1934)    PRN Meds: Place/Maintain arterial line **AND** sodium chloride, sodium chloride, albuterol, antiseptic oral rinse, fentaNYL, fentaNYL (SUBLIMAZE) injection, glycopyrrolate, haloperidol **OR** haloperidol **OR** haloperidol lactate, ipratropium-albuterol, LORazepam **OR** LORazepam **OR** LORazepam, metoprolol, ondansetron **OR** ondansetron (ZOFRAN) IV, polyvinyl alcohol, sodium chloride  flush, sodium chloride flush  Physical Exam       Well developed gentle man. Non responsive.  Appears relaxed Trach collar in place.  No respiratory distress CV irreg irreg Abdomen soft  Vital Signs: BP (!) 47/34   Pulse (!) 125   Temp 98.2 F (36.8 C) (Axillary)   Resp (!) 24   Ht '5\' 6"'  (1.676 m)   Wt (!) 197 kg (434 lb 4.9 oz)   SpO2 97%   BMI 70.10 kg/m  SpO2: SpO2: 97 % O2 Device: O2 Device: Tracheostomy Collar O2 Flow Rate: O2 Flow Rate (L/min): 15 L/min  Intake/output summary:  Intake/Output Summary (Last 24 hours) at 2016-07-07 0731 Last data filed at 07/07/2016 0600  Gross per 24 hour  Intake           572.17 ml  Output                0 ml  Net           572.17 ml   LBM: Last BM Date: 06/19/16 Baseline Weight: Weight: (!) 170.1 kg (375 lb) Most recent weight: Weight: (!) 197 kg (434 lb 4.9 oz)       Palliative Assessment/Data:    Flowsheet Rows     Most Recent Value  Intake Tab  Referral Department  Hospitalist  Unit at Time of Referral  ICU  Palliative Care Primary Diagnosis  Sepsis/Infectious Disease  Date Notified  06/09/16  Palliative Care Type  New Palliative care  Reason for  referral  Pain, Non-pain Symptom, Clarify Goals of Care  Date of Admission  05/13/2016  Date first seen by Palliative Care  06/09/16  # of days Palliative referral response time  0 Day(s)  # of days IP prior to Palliative referral  1  Clinical Assessment  Palliative Performance Scale Score  10%  Pain Max last 24 hours  8  Pain Min Last 24 hours  5  Dyspnea Max Last 24 Hours  Not able to report  Dyspnea Min Last 24 hours  Not able to report  Nausea Max Last 24 Hours  Not able to report  Nausea Min Last 24 Hours  Not able to report  Anxiety Max Last 24 Hours  Not able to report  Anxiety Min Last 24 Hours  Not able to report  Other Max Last 24 Hours  Not able to report  Psychosocial & Spiritual Assessment  Palliative Care Outcomes  Patient/Family meeting held?  Yes  Who was at the meeting?  grand daughter (medical decision maker), and daughter  Palliative Care Outcomes  Clarified goals of care  Palliative Care follow-up planned  Yes, Facility      Patient Active Problem List   Diagnosis Date Noted  . Shingles   . Acute respiratory failure with hypoxia ([place])   . Dysphagia   . Encounter for orogastric (OG) tube placement   . Encounter for orogastric tube placement   . Arm pain   . Pleural effusion   . Abdominal pain   . Fever   . Fracture of proximal humerus   . Pressure injury of skin 06/11/2016  . Tracheostomy care ([place])   . 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 HCAP (healthcare-associated pneumonia)   . Palliative care by specialist   . Palliative care encounter   . Acute respiratory failure with hypercapnia ([place])   . AKI (acute kidney injury) ([place])   . Hepatic lesion 05/22/2016  . Shock  liver 05/17/2016  . Current use of proton pump inhibitor 05/17/2016  . Contraindication to deep vein thrombosis (DVT) prophylaxis 05/17/2016  . Retroperitoneal hemorrhage 05/15/2016  . Goals of care, counseling/discussion 05/15/2016  . On enteral nutrition  05/15/2016  . Acute renal failure (ARF) ([place]) 05/15/2016  . Electrolyte imbalance 05/15/2016  . Change in mental status   . Acute congestive heart failure ([place])   . Shock circulatory ([place])   . Acute respiratory failure with hypoxemia ([place]) 05/29/2016  . Thrombocytopenia ([place]) 05/04/2014  . 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 Hemiparesis affecting right side as late effect of cerebrovascular accident ([place]) 09/11/2013  . Hypothyroidism 09/11/2013  . Cerebrovascular accident with involvement of right side of body ([place]) 04/17/2013  . Allergic rhinitis 06/06/2012  . Gout 06/06/2012  . Osteoarthrosis and allied disorders   . Hyperlipidemia   . Vitamin D deficiency   . Hypertension   . Asthma   . Paroxysmal atrial fibrillation ([place]) 03/10/2009  . OBESITY, MORBID 03/02/2009    Palliative Care Plan    Recommendations/Plan:  Full comfort measures  Family needs support.  Gradually wean oxygen from trach collar  Goals of Care and Additional Recommendations:  Limitations on Scope of Treatment: Full Comfort Care  Code Status:  DNR  Prognosis:   Hours - Days   Discharge Planning:  Anticipated Hospital Death  Care plan was discussed with RN   Thank you for allowing the Palliative Medicine Team to assist in the care of this patient.  Total time spent:  25 min.     Greater than 50%  of this time was spent counseling and coordinating care related to the above assessment and plan.  [name], PA-C Palliative Medicine  Please contact Palliative MedicineTeam phone at [phone] for questions and concerns between 7 am - 7 pm.   Please see AMION for individual provider pager numbers.

## 2016-07-04 NOTE — Discharge Summary (Signed)
Death Summary  Omar [name]:[id number] DOB: [birth year] DOA: 27-May-2016  PCP: Rudi Heap, MD PCP/Office notified: via epic  Admit date: May 27, 2016 Date of Death: 07-10-2016  Final Diagnoses:  Active Problems:   Paroxysmal atrial fibrillation (HCC)   Hemiparesis affecting right side as late effect of cerebrovascular accident [facility])   Hypothyroidism   Acute respiratory failure with hypoxemia (HCC)   Shock circulatory (HCC)   Change in mental status   Retroperitoneal hemorrhage   Goals of care, counseling/discussion   On enteral nutrition   Acute renal failure (ARF) (HCC)   Electrolyte imbalance   Shock liver   Current use of proton pump inhibitor   Contraindication to deep vein thrombosis (DVT) prophylaxis   Hepatic lesion   Palliative care encounter   Acute respiratory failure with hypercapnia (HCC)   AKI (acute kidney injury) (HCC)   Stage 5 chronic kidney disease on chronic dialysis (HCC)   HCAP (healthcare-associated pneumonia)   Palliative care by specialist   Tracheostomy care (HCC)   Pressure injury of skin   Abdominal pain   Fever   Fracture of proximal humerus   Arm pain   Pleural effusion   Dysphagia   Encounter for orogastric (OG) tube placement   Encounter for orogastric tube placement   Shingles   Acute respiratory failure with hypoxia (HCC)   Terminal care    Brief Summary: 71 y/o male from [facility] admitted 3/5 with altered mental status + H.flu PNA with aspiration, sepsis, hypoxic/hypercapnic respiratory failure. Course c/b retroperitoneal bleed while on heparin with subsequent shock, acute renal failure &prolonged ventilation requiring tracheostomy. On CRRT intermittently through hosp and has not tolerated HD due to hypotension.  He has had a tunneled HD catheter placed.  He continued to have significant hypotension preventing placement of permanent feeding tube. Started on dys I diet per SLP 4/10.  Palliative care was consulted. Pt was briefly started  on hemodialysis but not tolerated well due to hypotension.     Hospital Course:   Acute on chronic hypoxemic respiratory failure.   Pneumonia, H influenza (beta lactamase positive), treated New HCAP on 4/1, treated Pulm edema, improved Presumed OSA/OHS History of asthma Patient completed multiple courses of antibiotics. Patient underwent tracheostomy placement. Scopolamine and robinol added for copious secretions. However, patient continued to decline. Seen by palliative medicine. Made comfort care.  Septic and hemorrhagic shock. Off pressors 3/31 but had been intermittently hypotensive. Paroxysmal atrial fibrillation History of hypertension Initially required stress dose steroids. Was also started on midodrine. Blood cultures 4/15: coag neg staph. Repeat CXR 4/14: probable aspiration pneumonia. Tracheal aspirate culture G+ cocci in pair, few G - rods. Varicella zoster PCR positive - on zovirax per pharmacy dosing for full 2 weeks.   Vesicular/bullous rash on right chest Possible metal allergy from the sling he has been wearing.  DDx also include shingles.  T4 dermatome and has a large ulcerated area on his back in same dermatomal distribution. Varicella PCR positive. Started acyclovir 4/11  New ESRD, Acute renal failure, presumed due to ATN in the setting of hemorrhagic shock. Pt intermittently on CRRT and  Currently on HD since 4/6. HD MWF. Midodrine increasedbut still having soft BPs. Tunneled HD catheter placed on 4/9.   Large retroperitoneal bleed noted 3/10, some slight increased 3/18 CT scan. Hb has been stable.  Shock liver improving.  Hepatic lesion; noted on CT scan of the abdomen 3/18. 5 cm lesion of the dome of the liver, could represent blood or infection  or infarct.  Protein calorie malnutrition PEG/G tube unable to be placed. IR unable to place 2/2 complicated anatomy. Gen surg unable to place 2/2 hypotension.  Acute blood loss anemia superimposed on anemia of  chronic kidney disease, improved Severe heparin sensitivity, associated with hemorrhage Thrombocytopenia, acute on chronic  H. influenzae pneumonia and HCAP treatment completed Cefepime, started 3/23, changed to [facility] 06/04/16 for possible HCAP.  Completed meropenem on4/8.   Hypothyroidism, stable  Metabolic uremic encephalopathy History of CVA with right hemiplegia Rt humerus fracture    Patient did not show any improvement despite maximal treatment. Palliative medicine was consulted. They had discussions with family. Patient was made comfort care. Patient expired on 07/03/2016 at 11:29 a.m. Family requested autopsy. Patient's body will be sent over to [facility] tomorrow for autopsy.     The results of significant diagnostics from this hospitalization (including imaging, microbiology, ancillary and laboratory) are listed below for reference.    Significant Diagnostic Studies: Ct Abdomen Pelvis Wo Contrast  Result Date: 06/17/2016 CLINICAL DATA:  Fever of unknown origin. Morbid obesity. Thrombocytopenia. EXAM: CT CHEST, ABDOMEN AND PELVIS WITHOUT CONTRAST TECHNIQUE: Multidetector CT imaging of the chest, abdomen and pelvis was performed following the standard protocol without IV contrast. COMPARISON:  Abdominopelvic CT of 06/06/2016. Chest radiograph 06/17/2016. No comparison chest CT. FINDINGS: Multifactorial degradation, including patient morbid obesity, arms not raised above the head, lack of IV contrast, and mild motion. CT CHEST FINDINGS Cardiovascular: Left-sided central line which terminates at the high right atrium. Mild cardiomegaly. Right-sided central line terminates at the high SVC. Tortuous thoracic aorta. Pulmonary artery enlargement, 4.0 cm outflow tract. Mediastinum/Nodes: Borderline right paratracheal adenopathy at 10 mm. A node within the azygoesophageal recess measures 9 mm. Hilar regions poorly evaluated without intravenous contrast. Lungs/Pleura: Small right  pleural effusion is grossly similar. Tracheostomy. Right lower lobe airspace disease. More patchy posterior left upper and lower lobe airspace opacities. Musculoskeletal: No acute osseous abnormality. CT ABDOMEN PELVIS FINDINGS Hepatobiliary: Markedly limited evaluation of the liver. Lesion described in the central right lobe on 05/20/2016 is not readily apparent. Multiple small gallstones without surrounding inflammation or biliary duct dilatation. Pancreas: Normal, without mass or ductal dilatation. Spleen: Normal in size, without focal abnormality. Adrenals/Urinary Tract: Normal adrenal glands. No renal calculi or hydronephrosis. 12 mm left renal interpolar exophytic lesion measures greater than fluid density on image 66/ series 3. This is similar in size to on the prior exam. Interpolar right renal lesion measures 2.4 cm and borderline greater than fluid density. Normal urinary bladder. Stomach/Bowel: A nasogastric tube is looped in the stomach, terminating in the gastric body. Catheter in the rectum. Scattered colonic diverticula. Normal small bowel caliber. Vascular/Lymphatic: Normal aortic caliber. 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 The left common iliac is ectatic at 1.9 cm. No gross abdominopelvic adenopathy. Reproductive: Normal prostate. Other: Small bilateral fat containing inguinal hernias. Right sided retroperitoneal complex fluid collection is again identified. This is slightly decreased compared to 05/20/2016. Example 16.7 cm today versus 18.4 cm at the same level. This extends into the pelvis as before. Trace left pericolic gutter fluid is similar.  Anasarca. Musculoskeletal: Bilateral hip osteoarthritis. IMPRESSION: 1. Moderate-to-marked degradation, as detailed above. 2. Similar right pleural effusion with right lower lobe collapse/consolidation which could represent infection or atelectasis. More patchy left-sided airspace opacities are suspicious for infection or aspiration. 3. No  explanation for fever within the abdomen or pelvis. 4. Slight decrease in size of a large right-sided retroperitoneal hematoma. 5. Cholelithiasis.  6. Pulmonary artery enlargement suggests pulmonary arterial hypertension. 7. Mild thoracic adenopathy, likely reactive. 8. Bilateral common iliac artery dilatation. 9. Bilateral indeterminate renal lesions. These could be re-evaluated with follow-up pre and post contrast CT or MRI (as an outpatient) Electronically Signed   By: Jeronimo Greaves M.D.   On: 06/17/2016 21:03   Ct Abdomen Wo Contrast  Result Date: 06/06/2016 CLINICAL DATA:  Dysphagia, assess stomach anatomy for possible percutaneous gastrostomy placement. EXAM: CT ABDOMEN WITHOUT CONTRAST TECHNIQUE: Multidetector CT imaging of the abdomen was performed following the standard protocol without IV contrast. COMPARISON:  05/20/2016 FINDINGS: Lower chest: Limited with motion artifact. Bibasilar atelectasis and mild consolidation, worse on the right with trace pleural effusions. Heart is mildly enlarged. No pericardial effusion. No hiatal hernia. Hepatobiliary: No significant focal hepatic abnormality. Atrophy noted of the left hepatic lobe chronically. No biliary obstruction. Gallstones layer within the mildly distended gallbladder. Common bile duct is nondilated. Pancreas: Limited assessment.  No pancreatic ductal dilatation. Spleen: Normal in size without focal abnormality. Adrenals/Urinary Tract: Normal adrenal glands. No renal obstruction or hydronephrosis. Stable probable hypodense bilateral renal cyst. Stomach/Bowel: Feeding tube is coiled within the stomach. Stomach is highly positioned within the epigastric region but beneath the left subcostal margin as before and just superior to the transverse colon. Because the high position, the overlying costal margin and the close proximity to the transverse colon percutaneous gastric access is high risk for complication. Considered surgical referral for gastrostomy  insertion. Vascular/Lymphatic: Tortuosity atherosclerosis noted of the aortoiliac vessels. No gross adenopathy. Large heterogeneous right retroperitoneal hematoma again noted. No significant interval enlargement or change. Other: No new fluid collection or abscess. Negative for ascites. Diffuse body anasarca noted. Musculoskeletal: Degenerative changes noted of the spine. IMPRESSION: High positioned stomach beneath the left subcostal margin and close proximity to the transverse colon. Similar to the 05/20/2016 exam. See above comment. Recommend surgical referral for gastrostomy insertion. Bibasilar mild atelectasis/ consolidation and trace pleural effusions. Cholelithiasis Stable large right retroperitoneal hematoma Aortoiliac atherosclerosis Electronically Signed   By: Judie Petit.  Shick M.D.   On: 06/06/2016 17:25   Dg Forearm Right  Result Date: 05/30/2016 CLINICAL DATA:  Right arm pain. EXAM: RIGHT FOREARM - 2 VIEW COMPARISON:  No recent. FINDINGS: No acute bony or joint abnormality identified. No evidence of fracture or dislocation. IMPRESSION: No acute or focal abnormality. Electronically Signed   By: Maisie Fus  Register   On: 05/30/2016 11:16   Ct Head Wo Contrast  Result Date: 06/18/2016 CLINICAL DATA:  Mental status change.  Inpatient. EXAM: CT HEAD WITHOUT CONTRAST TECHNIQUE: Contiguous axial images were obtained from the base of the skull through the vertex without intravenous contrast. COMPARISON:  06/04/2016 head CT. FINDINGS: Motion degraded scan. Brain: No evidence of parenchymal hemorrhage or extra-axial fluid collection. No mass lesion, mass effect, or midline shift. No CT evidence of acute infarction. Generalized cerebral volume loss. Nonspecific mild to moderate subcortical and periventricular white matter hypodensity, most in keeping with chronic small vessel ischemic change. No ventriculomegaly. Vascular: Intracranial atherosclerosis.  No acute abnormality. Skull: No evidence of calvarial fracture.  Sinuses/Orbits: Mucoperiosteal thickening in the bilateral ethmoidal air cells, right frontal sinus and sphenoid sinuses. No fluid levels. Other: Partial bilateral mastoid effusions, left greater than right. Right nasal route tube enters the hypopharynx on the scout tomograms. IMPRESSION: 1.  No evidence of acute intracranial abnormality. 2. Generalized cerebral volume loss and chronic small vessel ischemia. 3. Paranasal sinusitis. 4. Bilateral partial mastoid effusions. Electronically Signed   By: Barbara Cower  A Poff M.D.   On: 06/18/2016 23:16   Ct Head Wo Contrast  Result Date: 06/04/2016 CLINICAL DATA:  Altered mental status EXAM: CT HEAD WITHOUT CONTRAST TECHNIQUE: Contiguous axial images were obtained from the base of the skull through the vertex without intravenous contrast. COMPARISON:  05/10/2016, 06/03/2016. FINDINGS: Brain: There is no intracranial hemorrhage, mass or evidence of acute infarction. There is moderate generalized atrophy. There is mild chronic microvascular ischemic change. There is no significant extra-axial fluid collection. No acute intracranial findings are evident. Vascular: No hyperdense vessel or unexpected calcification. Skull: Normal. Negative for fracture or focal lesion. Sinuses/Orbits: Paranasal sinuses are clear. Unchanged venous varices of the orbits. Other: None. IMPRESSION: No acute intracranial findings. There is moderate generalized atrophy and chronic appearing white matter hypodensities which likely represent small vessel ischemic disease. Electronically Signed   By: Ellery Plunk M.D.   On: 06/04/2016 22:29   Ct Chest Wo Contrast  Result Date: 06/17/2016 CLINICAL DATA:  Fever of unknown origin. Morbid obesity. Thrombocytopenia. EXAM: CT CHEST, ABDOMEN AND PELVIS WITHOUT CONTRAST TECHNIQUE: Multidetector CT imaging of the chest, abdomen and pelvis was performed following the standard protocol without IV contrast. COMPARISON:  Abdominopelvic CT of 06/06/2016. Chest  radiograph 06/17/2016. No comparison chest CT. FINDINGS: Multifactorial degradation, including patient morbid obesity, arms not raised above the head, lack of IV contrast, and mild motion. CT CHEST FINDINGS Cardiovascular: Left-sided central line which terminates at the high right atrium. Mild cardiomegaly. Right-sided central line terminates at the high SVC. Tortuous thoracic aorta. Pulmonary artery enlargement, 4.0 cm outflow tract. Mediastinum/Nodes: Borderline right paratracheal adenopathy at 10 mm. A node within the azygoesophageal recess measures 9 mm. Hilar regions poorly evaluated without intravenous contrast. Lungs/Pleura: Small right pleural effusion is grossly similar. Tracheostomy. Right lower lobe airspace disease. More patchy posterior left upper and lower lobe airspace opacities. Musculoskeletal: No acute osseous abnormality. CT ABDOMEN PELVIS FINDINGS Hepatobiliary: Markedly limited evaluation of the liver. Lesion described in the central right lobe on 05/20/2016 is not readily apparent. Multiple small gallstones without surrounding inflammation or biliary duct dilatation. Pancreas: Normal, without mass or ductal dilatation. Spleen: Normal in size, without focal abnormality. Adrenals/Urinary Tract: Normal adrenal glands. No renal calculi or hydronephrosis. 12 mm left renal interpolar exophytic lesion measures greater than fluid density on image 66/ series 3. This is similar in size to on the prior exam. Interpolar right renal lesion measures 2.4 cm and borderline greater than fluid density. Normal urinary bladder. Stomach/Bowel: A nasogastric tube is looped in the stomach, terminating in the gastric body. Catheter in the rectum. Scattered colonic diverticula. Normal small bowel caliber. Vascular/Lymphatic: Normal aortic caliber. 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 The left common iliac is ectatic at 1.9 cm. No gross abdominopelvic adenopathy. Reproductive: Normal prostate. Other: Small  bilateral fat containing inguinal hernias. Right sided retroperitoneal complex fluid collection is again identified. This is slightly decreased compared to 05/20/2016. Example 16.7 cm today versus 18.4 cm at the same level. This extends into the pelvis as before. Trace left pericolic gutter fluid is similar.  Anasarca. Musculoskeletal: Bilateral hip osteoarthritis. IMPRESSION: 1. Moderate-to-marked degradation, as detailed above. 2. Similar right pleural effusion with right lower lobe collapse/consolidation which could represent infection or atelectasis. More patchy left-sided airspace opacities are suspicious for infection or aspiration. 3. No explanation for fever within the abdomen or pelvis. 4. Slight decrease in size of a large right-sided retroperitoneal hematoma. 5. Cholelithiasis. 6. Pulmonary artery enlargement suggests pulmonary arterial hypertension. 7. Mild thoracic  adenopathy, likely reactive. 8. Bilateral common iliac artery dilatation. 9. Bilateral indeterminate renal lesions. These could be re-evaluated with follow-up pre and post contrast CT or MRI (as an outpatient) Electronically Signed   By: Jeronimo Greaves M.D.   On: 06/17/2016 21:03   Ir Fluoro Guide Cv Line Left  Result Date: 06/12/2016 CLINICAL DATA:  Dialysis catheter placement EXAM: CHEST FLUOROSCOPY COMPARISON:  06/09/2016 FINDINGS: Interval placement of left dialysis catheter with the tips in the right atrium. Right catheter tip remains in the SVC. Tracheostomy tube is unchanged. IMPRESSION: Left dialysis catheter placement with the tip in the right atrium. Other support devices are stable. Electronically Signed   By: Charlett Nose M.D.   On: 06/12/2016 15:43   Ir Removal Tun Cv Cath W/o Fl  Result Date: 06/12/2016 INDICATION: Patient with history of renal failure and tunneled left internal jugular hemodialysis catheter placement on 06/12/2016. He is now under palliative care/full comfort measures and request made for removal of HD  catheter . EXAM: REMOVAL TUNNELED CENTRAL VENOUS CATHETER MEDICATIONS: None ANESTHESIA/SEDATION: None FLUOROSCOPY TIME:  None COMPLICATIONS: None immediate. PROCEDURE: Informed written consent was obtained from the patient's family after a thorough discussion of the procedural risks, benefits and alternatives. All questions were addressed. Maximal Sterile Barrier Technique was utilized including caps, mask, sterile gowns, sterile gloves, sterile drape, hand hygiene and skin antiseptic. A timeout was performed prior to the initiation of the procedure. The patient's left chest and catheter was prepped and draped in a normal sterile fashion. Using gentle blunt dissection the cuff of the catheter was exposed and the catheter was removed in it's entirety. Pressure was held till hemostasis was obtained. A sterile dressing was applied. The patient tolerated the procedure well with no immediate complications. IMPRESSION: Successful catheter removal as described above. Read by: Jeananne Rama, PA-C Electronically Signed   By: Jolaine Click M.D.   On: 06/10/2016 12:01   Ir US Guide Vasc Access Left  Result Date: 06/12/2016 CLINICAL DATA:  Dialysis catheter placement EXAM: CHEST FLUOROSCOPY COMPARISON:  06/09/2016 FINDINGS: Interval placement of left dialysis catheter with the tips in the right atrium. Right catheter tip remains in the SVC. Tracheostomy tube is unchanged. IMPRESSION: Left dialysis catheter placement with the tip in the right atrium. Other support devices are stable. Electronically Signed   By: Charlett Nose M.D.   On: 06/12/2016 15:43   Dg Chest Port 1 View  Result Date: 06/17/2016 CLINICAL DATA:  Pt has had a fever and is having tenderness throughout his abdomen per MD. Pt cannot relay hx. Pt has shingles EXAM: PORTABLE CHEST 1 VIEW COMPARISON:  06/16/2016 FINDINGS: Tracheostomy tube, feeding tube and central venous line not changed. Bilateral pleural effusions RIGHT greater than LEFT. Mild central venous  congestion. No pneumothorax IMPRESSION: 1. Stable support apparatus. 2. Bilateral pleural effusions and central venous congestion increased from prior. Electronically Signed   By: Genevive Bi M.D.   On: 06/17/2016 10:08   Dg Chest Port 1 View  Result Date: 06/13/2016 CLINICAL DATA:  Fever. EXAM: PORTABLE CHEST 1 VIEW COMPARISON:  Radiograph of June 09, 2016. FINDINGS: Stable cardiomegaly. Tracheostomy tube is unchanged in position. Feeding tube is unchanged in position. Right internal jugular catheter is unchanged. Interval placement of left internal jugular dialysis catheter with distal tip in expected position of cavoatrial junction. No pneumothorax is is noted. Probable small right pleural effusion is noted. Mild bibasilar subsegmental atelectasis is noted. Bony thorax is unremarkable. IMPRESSION: Interval placement of left internal jugular dialysis  catheter. Otherwise stable support apparatus. Probable small right pleural effusion. Mild bibasilar subsegmental atelectasis. Electronically Signed   By: Lupita Raider, M.D.   On: 06/13/2016 07:47   Dg Chest Port 1 View  Result Date: 06/09/2016 CLINICAL DATA:  Hypoxia EXAM: PORTABLE CHEST 1 VIEW COMPARISON:  June 01, 2016 FINDINGS: Tracheostomy catheter tip is 6.4 cm above the carina. Feeding tube tip is below the diaphragm. Central catheter tip is in the superior vena cava. No pneumothorax. There is patchy bibasilar atelectasis. Lungs elsewhere are clear. Heart is mildly enlarged with pulmonary venous hypertension. No adenopathy. No bone lesions. There are surgical clips in the right lower neck region. IMPRESSION: Tube and catheter positions as described without evident pneumothorax. There is pulmonary vascular congestion without frank edema or consolidation. There is mild bibasilar atelectasis. Electronically Signed   By: Bretta Bang III M.D.   On: 06/09/2016 07:27   Dg Chest Port 1 View  Result Date: 06/01/2016 CLINICAL DATA:  Respiratory  failure. EXAM: PORTABLE CHEST 1 VIEW COMPARISON:  05/29/2016 . FINDINGS: Surgical clips right neck. Tracheostomy tube, feeding tube, right IJ line stable position. Cardiomegaly with diffuse bilateral pulmonary infiltrates and small right pleural effusion again noted consistent with CHF. No significant change. Bilateral pneumonia cannot be excluded. Low lung volumes. IMPRESSION: 1.  Tracheostomy tube, feeding tube, right IJ line stable position. 2. Cardiomegaly with pulmonary venous congestion, persistent bilateral pulmonary infiltrates/ edema, and small right pleural effusion. Findings consistent with persistent CHF. No significant change. 3. Low lung volumes . Electronically Signed   By: Maisie Fus  Register   On: 06/01/2016 07:34   Dg Chest Port 1 View  Result Date: 05/29/2016 CLINICAL DATA:  Respiratory failure.  Shortness of breath. EXAM: PORTABLE CHEST 1 VIEW COMPARISON:  05/26/2016 . FINDINGS: Tracheostomy tube, feeding tube, bilateral IJ lines in stable position. Cardiomegaly with diffuse bilateral pulmonary infiltrates consistent pulmonary edema. Bilateral pleural effusions. Surgical clips right neck. IMPRESSION: 1. Lines and tubes in stable position. 2. Cardiomegaly with diffuse bilateral pulmonary infiltrates and bilateral pleural effusions consistent with CHF. Electronically Signed   By: Maisie Fus  Register   On: 05/29/2016 06:43   Dg Chest Port 1 View  Result Date: 05/26/2016 CLINICAL DATA:  Status post dialysis catheter placement EXAM: PORTABLE CHEST 1 VIEW COMPARISON:  05/25/2016 FINDINGS: Right and left jugular central venous catheters in unchanged position. Interval placement of a feeding tube coursing below the diaphragm. Tracheostomy tube in satisfactory position. Bilateral mild interstitial thickening. No pleural effusion or pneumothorax. Stable cardiomegaly. No acute osseous abnormality. IMPRESSION: 1. Support lines and tubing in unchanged position. 2. Nasogastric tube coursing below the  diaphragm. 3. Cardiomegaly with mild pulmonary vascular congestion which has improved compared with the prior exam. Electronically Signed   By: Elige Ko   On: 05/26/2016 12:09   Dg Chest Port 1 View  Result Date: 05/25/2016 CLINICAL DATA:  Respiratory failure. EXAM: PORTABLE CHEST 1 VIEW COMPARISON:  05/23/2016. FINDINGS: Surgical clips right neck. Interim removal of endotracheal tube. Tracheostomy tube noted in good anatomic position. Interim removal of NG tube . Bilateral IJ lines in stable position. Cardiomegaly with diffuse bilateral pulmonary infiltrates consistent pulmonary edema again noted. No interim change. Small bilateral pleural effusions. No pneumothorax . IMPRESSION: 1. Interim removal of endotracheal tube. Tracheostomy tube noted in good anatomic position. Interim removal of NG tube. Bilateral IJ lines in stable position. 2. Cardiomegaly with diffuse bilateral pulmonary infiltrates consistent pulmonary edema. Bilateral pneumonia cannot be excluded. Small bilateral pleural effusions. Similar findings noted  on prior exam . Electronically Signed   By: Maisie Fus  Register   On: 05/25/2016 07:06   Dg Chest Port 1 View  Result Date: 05/23/2016 CLINICAL DATA:  Acute respiratory failure. EXAM: PORTABLE CHEST 1 VIEW COMPARISON:  Radiograph of May 22, 2016. FINDINGS: Endotracheal and nasogastric tubes are unchanged in position. Stable position of bilateral jugular catheters. No pneumothorax is noted. Mildly increased perihilar and basilar opacities are noted concerning for worsening edema or infiltrates. Bony thorax is unremarkable. IMPRESSION: Stable support apparatus. Mildly increased bilateral lung opacities as described above. Electronically Signed   By: Lupita Raider, M.D.   On: 05/23/2016 07:41   Dg Shoulder Right Port  Result Date: 06/16/2016 CLINICAL DATA:  71 year old male with a history of prior right humeral fracture EXAM: PORTABLE RIGHT SHOULDER COMPARISON:  Plain film 05/30/2016  FINDINGS: Oblique fracture of the proximal right humerus again demonstrated without significant callus formation. Unchanged alignment of the fracture fragments. Degenerative changes at the glenohumeral joint and acromioclavicular joint. Right IJ central catheter in place without pneumothorax. Tracheostomy partially imaged.  Enteric tube partially imaged. IMPRESSION: Similar appearance of oblique fracture proximal right humerus, without significant callus formation in the interval and no change in the fracture alignment. Degenerative changes of the glenohumeral joint and acromioclavicular joint. Surgical tubes and lines partially image, as above. Electronically Signed   By: Gilmer Mor D.O.   On: 06/16/2016 13:16   Dg Shoulder Right Port  Result Date: 06/05/2016 CLINICAL DATA:  Proximal humeral fracture. EXAM: PORTABLE RIGHT SHOULDER COMPARISON:  Chest x-ray 06/01/2016.  Humerus series 3 09/22/2016. FINDINGS: Fracture of the proximal right humerus again noted without interim change. Minimal displacement on single portable view. IMPRESSION: Stable appearing fracture of the proximal right humerus. Electronically Signed   By: Maisie Fus  Register   On: 06/05/2016 07:00   Dg Abd Portable 1v  Result Date: 06/17/2016 CLINICAL DATA:  71 year old male with history of fever and abdominal tenderness this morning. EXAM: PORTABLE ABDOMEN - 1 VIEW COMPARISON:  05/25/2016. FINDINGS: Feeding tube coiled in the stomach with tip in the antral pre-pyloric region. Gas and stool are seen scattered throughout the colon extending to the level of the distal rectum. No pathologic distension of small bowel is noted. No gross evidence of pneumoperitoneum. IMPRESSION: 1. Tip of feeding tube is in the antral pre-pyloric region of the stomach. 2. Nonobstructive bowel gas pattern. 3. No pneumoperitoneum. Electronically Signed   By: Trudie Reed M.D.   On: 06/17/2016 10:14   Dg Abd Portable 1v  Result Date: 05/25/2016 CLINICAL DATA:   Feeding tube placement EXAM: PORTABLE ABDOMEN - 1 VIEW COMPARISON:  None. FINDINGS: Feeding tube coils in the fundus of the stomach with the tip in the mid portion of the stomach. IMPRESSION: Feeding tube coils in the fundus of the stomach. Electronically Signed   By: Charlett Nose M.D.   On: 05/25/2016 10:45   Dg Humerus Right  Result Date: 05/30/2016 CLINICAL DATA:  Pain. EXAM: RIGHT HUMERUS - 2+ VIEW COMPARISON:  Chest x-ray 05/29/2016 . FINDINGS: Fracture is noted at the proximal right humeral diaphysis. Slight displacement is present. No other focal abnormality identified. IMPRESSION: Fractures of the proximal right humeral diaphysis. Slight displacement. Electronically Signed   By: Maisie Fus  Register   On: 05/30/2016 11:18   US Abdomen Limited Ruq  Result Date: 05/30/2016 CLINICAL DATA:  Request for evaluation of the liver abnormality seen on CT. EXAM: US ABDOMEN LIMITED - RIGHT UPPER QUADRANT COMPARISON:  CT 10 days ago.  FINDINGS: Because of the patient's body habitus, there is virtually no useful information. The technologist could not comparable E evaluate the liver parenchyma, gallbladder or ductal system because of very limited penetration. . IMPRESSION: Non useful ultrasound because of body habitus.  No useful data. Electronically Signed   By: Paulina Fusi M.D.   On: 05/30/2016 16:53    Microbiology: Recent Results (from the past 240 hour(s))  Culture, Urine     Status: Abnormal   Collection Time: 06/13/16  8:08 AM  Result Value Ref Range Status   Specimen Description URINE, RANDOM  Final   Special Requests Normal  Final   Culture >=100,000 COLONIES/mL YEAST (A)  Final   Report Status 06/14/2016 FINAL  Final  Culture, respiratory (NON-Expectorated)     Status: None   Collection Time: 06/13/16  9:04 AM  Result Value Ref Range Status   Specimen Description TRACHEAL ASPIRATE  Final   Special Requests Normal  Final   Gram Stain   Final    MODERATE WBC PRESENT,BOTH PMN AND  MONONUCLEAR FEW GRAM POSITIVE COCCI IN PAIRS IN CLUSTERS RARE GRAM NEGATIVE RODS    Culture Consistent with normal respiratory flora.  Final   Report Status 06/15/2016 FINAL  Final  Culture, blood (Routine X 2) w Reflex to ID Panel     Status: None   Collection Time: 06/13/16  9:44 AM  Result Value Ref Range Status   Specimen Description BLOOD LEFT ARM  Final   Special Requests IN PEDIATRIC BOTTLE Blood Culture adequate volume  Final   Culture NO GROWTH 5 DAYS  Final   Report Status 06/18/2016 FINAL  Final  Culture, blood (Routine X 2) w Reflex to ID Panel     Status: None   Collection Time: 06/13/16 10:49 AM  Result Value Ref Range Status   Specimen Description BLOOD LEFT HAND  Final   Special Requests IN PEDIATRIC BOTTLE BCAV  Final   Culture NO GROWTH 5 DAYS  Final   Report Status 06/18/2016 FINAL  Final  Culture, blood (Routine X 2) w Reflex to ID Panel     Status: None (Preliminary result)   Collection Time: 06/17/16  9:45 AM  Result Value Ref Range Status   Specimen Description BLOOD RIGHT HAND  Final   Special Requests IN PEDIATRIC BOTTLE Blood Culture adequate volume  Final   Culture NO GROWTH 4 DAYS  Final   Report Status PENDING  Incomplete  Culture, blood (Routine X 2) w Reflex to ID Panel     Status: Abnormal   Collection Time: 06/17/16  9:49 AM  Result Value Ref Range Status   Specimen Description BLOOD RIGHT HAND  Final   Special Requests IN PEDIATRIC BOTTLE Blood Culture adequate volume  Final   Culture  Setup Time   Final    GRAM POSITIVE COCCI IN CLUSTERS IN PEDIATRIC BOTTLE CRITICAL RESULT CALLED TO, READ BACK BY AND VERIFIED WITH: PHARMD N GAZDA [id number] MLM    Culture (A)  Final    STAPHYLOCOCCUS SPECIES (COAGULASE NEGATIVE) THE SIGNIFICANCE OF ISOLATING THIS ORGANISM FROM A SINGLE SET OF BLOOD CULTURES WHEN MULTIPLE SETS ARE DRAWN IS UNCERTAIN. PLEASE NOTIFY THE MICROBIOLOGY DEPARTMENT WITHIN ONE WEEK IF SPECIATION AND SENSITIVITIES ARE REQUIRED.     Report Status 06/20/2016 FINAL  Final  Blood Culture ID Panel (Reflexed)     Status: Abnormal   Collection Time: 06/17/16  9:49 AM  Result Value Ref Range Status   Enterococcus species NOT DETECTED NOT DETECTED  Final   Listeria monocytogenes NOT DETECTED NOT DETECTED Final   Staphylococcus species DETECTED (A) NOT DETECTED Final    Comment: Methicillin (oxacillin) resistant coagulase negative staphylococcus. Possible blood culture contaminant (unless isolated from more than one blood culture draw or clinical case suggests pathogenicity). No antibiotic treatment is indicated for blood  culture contaminants. CRITICAL RESULT CALLED TO, READ BACK BY AND VERIFIED WITH: PHARMD N GAZDA [id number] MLM    Staphylococcus aureus NOT DETECTED NOT DETECTED Final   Methicillin resistance DETECTED (A) NOT DETECTED Final    Comment: CRITICAL RESULT CALLED TO, READ BACK BY AND VERIFIED WITH: PHARMD N GAZDA [id number] MLM    Streptococcus species NOT DETECTED NOT DETECTED Final   Streptococcus agalactiae NOT DETECTED NOT DETECTED Final   Streptococcus pneumoniae NOT DETECTED NOT DETECTED Final   Streptococcus pyogenes NOT DETECTED NOT DETECTED Final   Acinetobacter baumannii NOT DETECTED NOT DETECTED Final   Enterobacteriaceae species NOT DETECTED NOT DETECTED Final   Enterobacter cloacae complex NOT DETECTED NOT DETECTED Final   Escherichia coli NOT DETECTED NOT DETECTED Final   Klebsiella oxytoca NOT DETECTED NOT DETECTED Final   Klebsiella pneumoniae NOT DETECTED NOT DETECTED Final   Proteus species NOT DETECTED NOT DETECTED Final   Serratia marcescens NOT DETECTED NOT DETECTED Final   Haemophilus influenzae NOT DETECTED NOT DETECTED Final   Neisseria meningitidis NOT DETECTED NOT DETECTED Final   Pseudomonas aeruginosa NOT DETECTED NOT DETECTED Final   Candida albicans NOT DETECTED NOT DETECTED Final   Candida glabrata NOT DETECTED NOT DETECTED Final   Candida krusei NOT DETECTED NOT DETECTED  Final   Candida parapsilosis NOT DETECTED NOT DETECTED Final   Candida tropicalis NOT DETECTED NOT DETECTED Final  Cath Tip Culture     Status: Abnormal (Preliminary result)   Collection Time: 06/19/16 12:45 PM  Result Value Ref Range Status   Specimen Description CATH TIP  Final   Special Requests NONE  Final   Culture 60,000 COLONIES/mL STAPHYLOCOCCUS EPIDERMIDIS (A)  Final   Report Status PENDING  Incomplete   Organism ID, Bacteria STAPHYLOCOCCUS EPIDERMIDIS (A)  Final      Susceptibility   Staphylococcus epidermidis - MIC*    CIPROFLOXACIN >=8 RESISTANT Resistant     ERYTHROMYCIN >=8 RESISTANT Resistant     GENTAMICIN 8 INTERMEDIATE Intermediate     OXACILLIN >=4 RESISTANT Resistant     TETRACYCLINE 2 SENSITIVE Sensitive     VANCOMYCIN 1 SENSITIVE Sensitive     TRIMETH/SULFA 160 RESISTANT Resistant     CLINDAMYCIN >=8 RESISTANT Resistant     RIFAMPIN <=0.5 SENSITIVE Sensitive     Inducible Clindamycin NEGATIVE Sensitive     * 60,000 COLONIES/mL STAPHYLOCOCCUS EPIDERMIDIS  C difficile quick scan w PCR reflex     Status: None   Collection Time: 06/19/16  1:30 PM  Result Value Ref Range Status   C Diff antigen NEGATIVE NEGATIVE Final   C Diff toxin NEGATIVE NEGATIVE Final   C Diff interpretation No C. difficile detected.  Final     Labs: Basic Metabolic Panel:  Recent Labs Lab 06/17/16 2304 06/18/16 0839 06/18/16 0930 06/18/16 2030 06/19/16 0548 06/20/16 0150  NA 130* 130* 130* 132* 132* 131*  K 4.0 4.0 4.1 4.1 4.3 4.4  CL 96* 95* 96* 95* 94* 94*  CO2 GLUCOSE 110* 101* 104* 101* 102* 111*  BUN 57* 77* 79* 84* 93* 107*  CREATININE 4.56* 5.00* 5.19* 5.74* 6.11* 7.22*  CALCIUM 8.1*  8.2* 8.1* 8.4* 8.3* 8.3*  PHOS 5.6* 7.3*  --  6.9* 9.0* 9.9*   Liver Function Tests:  Recent Labs Lab 06/18/16 0839 06/18/16 0930 06/18/16 2030 06/19/16 0548 06/20/16 0150  AST  --  25  --   --   --   ALT  --  7*  --   --   --   ALKPHOS  --  84  --   --   --    BILITOT  --  1.3*  --   --   --   PROT  --  7.1  --   --   --   ALBUMIN 1.9* 1.9* 1.9* 1.9* 2.0*   CBC:  Recent Labs Lab 06/17/16 2304 06/18/16 0930 06/18/16 2030 06/19/16 0548 06/20/16 0150  WBC 3.9* 4.3 3.5* 4.3 4.5  NEUTROABS 0.2*  --   --   --   --   HGB 7.7* 8.4* 8.6* 8.3* 8.3*  HCT 24.2* 27.0* 27.4* 27.2* 26.9*  MCV 111.0* 111.1* 110.9* 111.5* 110.7*  PLT 225 263 290 300 311   CBG:  Recent Labs Lab 06/20/16 0044 06/20/16 0510 06/20/16 0730 06/20/16 1215 06/20/16 1558  GLUCAP 130* 122* 129* 151* 131*   Urinalysis    Component Value Date/Time   COLORURINE AMBER (A) 06/13/2016 0808   APPEARANCEUR CLOUDY (A) 06/13/2016 0808   LABSPEC 1.023 06/13/2016 0808   PHURINE 6.0 06/13/2016 0808   GLUCOSEU NEGATIVE 06/13/2016 0808   HGBUR LARGE (A) 06/13/2016 0808   BILIRUBINUR NEGATIVE 06/13/2016 0808   BILIRUBINUR small 10/06/2014 1612   KETONESUR NEGATIVE 06/13/2016 0808   PROTEINUR 100 (A) 06/13/2016 0808   UROBILINOGEN 2.0 10/06/2014 1612   UROBILINOGEN 4.0 (H) 05/30/2010 1511   NITRITE NEGATIVE 06/13/2016 0808   LEUKOCYTESUR MODERATE (A) 06/13/2016 0808      Tabbatha Bordelon  [facility] 06/06/2016, 3:36 PM

## 2016-07-04 NOTE — Progress Notes (Addendum)
12:21pm  Fentanyl 206cc wasted. Witnessed by [employer]

## 2016-07-04 NOTE — Progress Notes (Signed)
Nutrition Brief Note  Chart reviewed. Pt now transitioning to comfort care.  No further nutrition interventions warranted at this time.  Please re-consult as needed.   Kapena Hamme A. Raia Amico, RD, LDN, CDE Pager: [phone] After hours Pager: [phone]  

## 2016-07-04 NOTE — Procedures (Signed)
Tunneled left IJ HD cath removed in its entirety without immediate complications. Gauze dressing applied to site.

## 2016-07-04 NOTE — Progress Notes (Signed)
Verified and witnessed fentanyl 206 ml wasted.

## 2016-07-04 NOTE — Progress Notes (Signed)
Reviewing pt's chart.    Pt's family has elected to shift goals of care to full comfort measures.    In this setting, we will not offer dialytic intervention.    We will sign off.  Please let us know if you have questions or concerns.    Bufford Buttner MD [facility] pgr [phone]

## 2016-07-04 DEATH — deceased

## 2017-05-13 IMAGING — CT CT HEAD W/O CM
3 of 4 series · 14 of 47 positions shown, 16 images · non-contrast
Comparison: 06/04/2016 head CT.

CLINICAL DATA: Mental status change.  Inpatient.

EXAM:
CT HEAD WITHOUT CONTRAST
TECHNIQUE: Contiguous axial images were obtained from the base of the skull
through the vertex without intravenous contrast.

[Series 4: head 3.0 mpr cor · coronal · 0.36mm/px · 3 of 77 slices shown]
[im 26/77  brain]
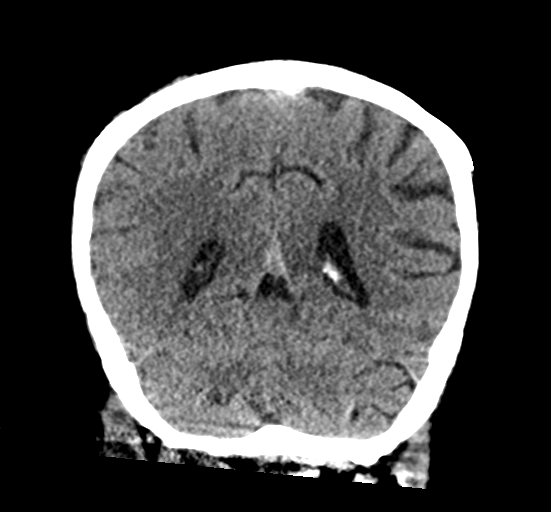
[im 34/77  brain]
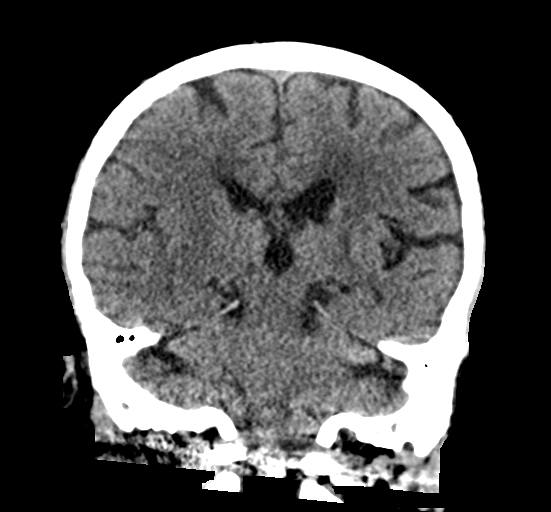
[im 43/77  brain]
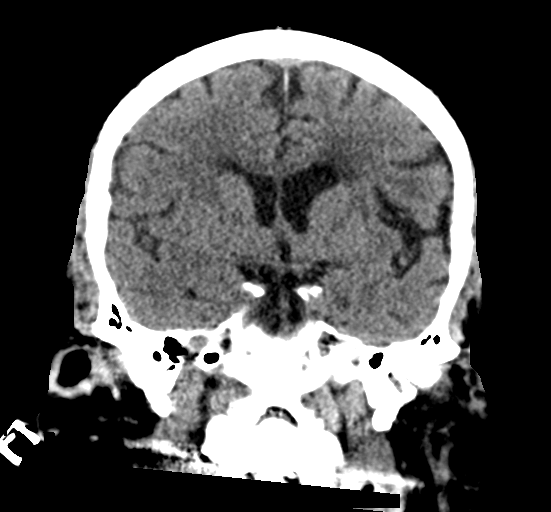

[Series 5: head 3.0 mpr sag · sagittal · 0.36mm/px · 3 of 63 slices shown]
[im 24/63  brain]
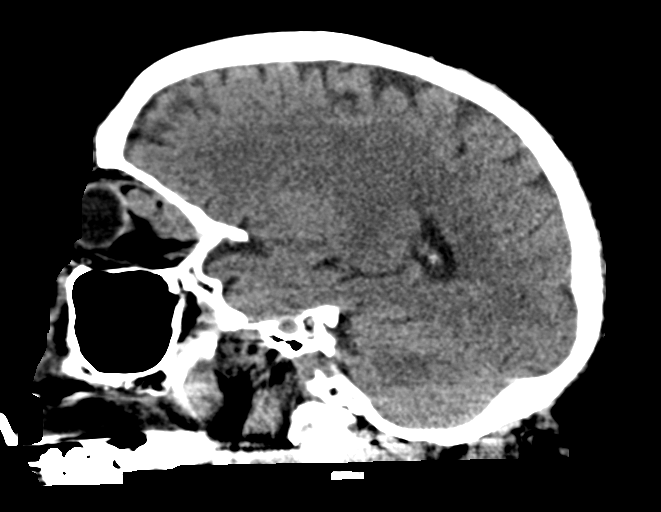
[im 32/63  brain]
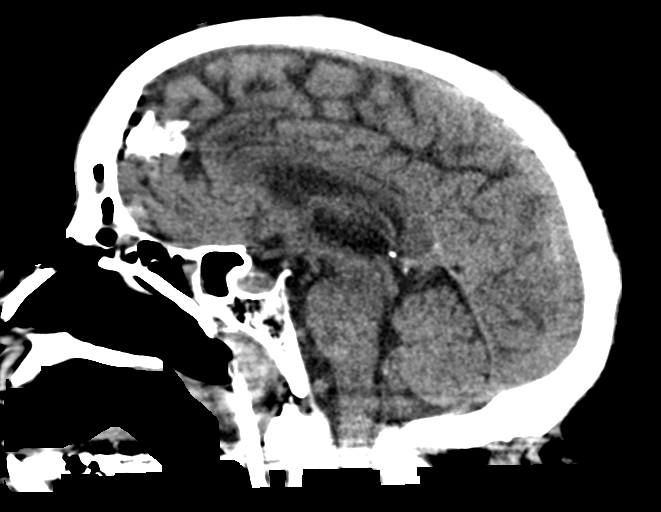
[im 39/63  brain]
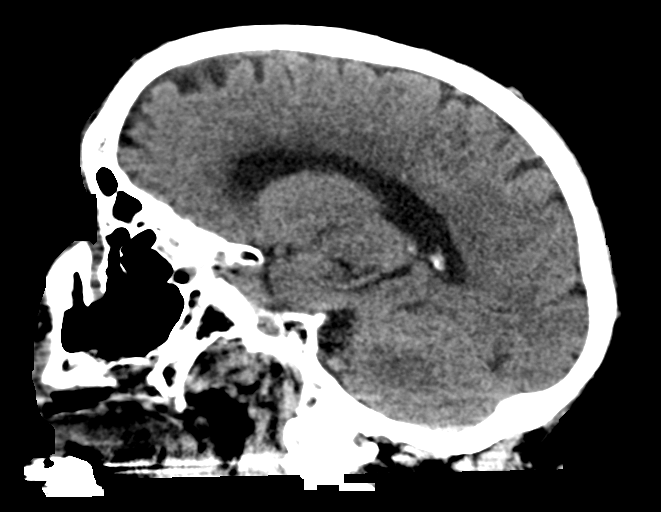

[Series 6: head 2.0 mpr ax · axial · 0.40mm/px · z∈[-192,-50]mm · 8 of 91 slices shown, 10 images]
[im 10/91  brain]
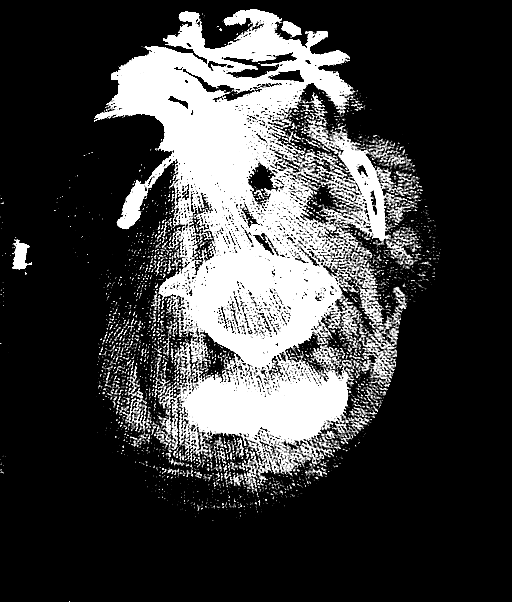
[im 10/91  bone]
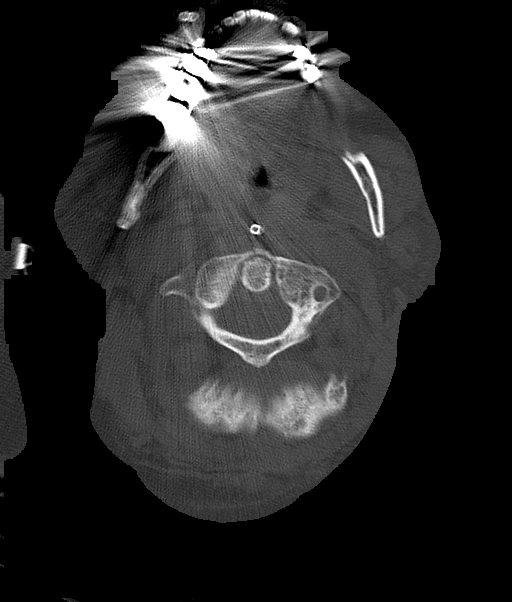
[im 19/91  brain]
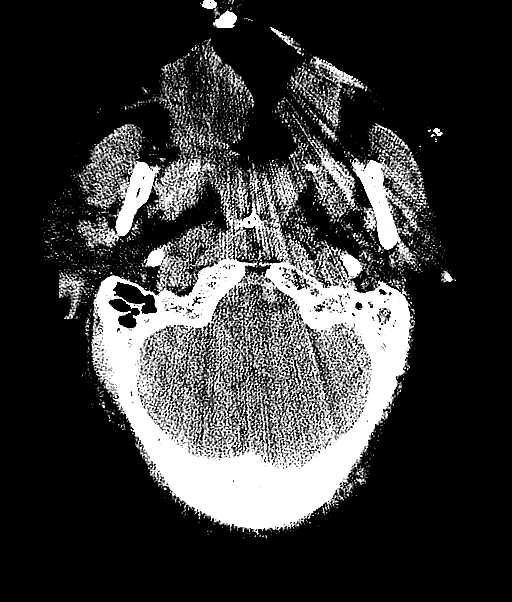
[im 28/91  brain]
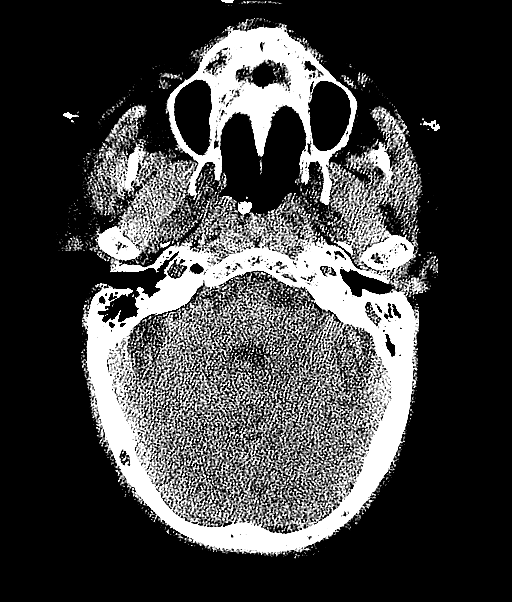
[im 41/91  brain]
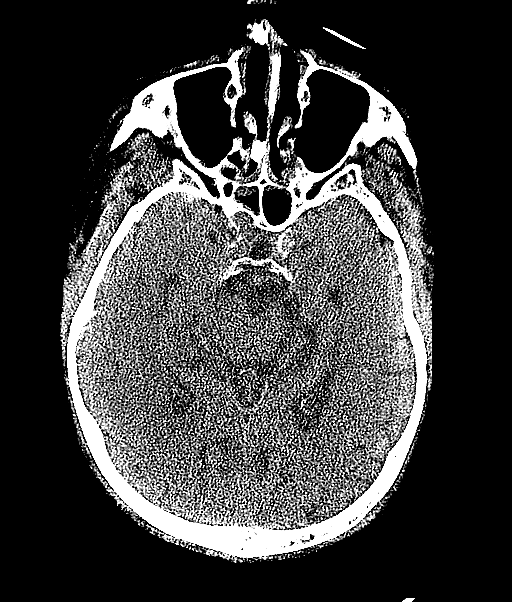
[im 50/91  brain]
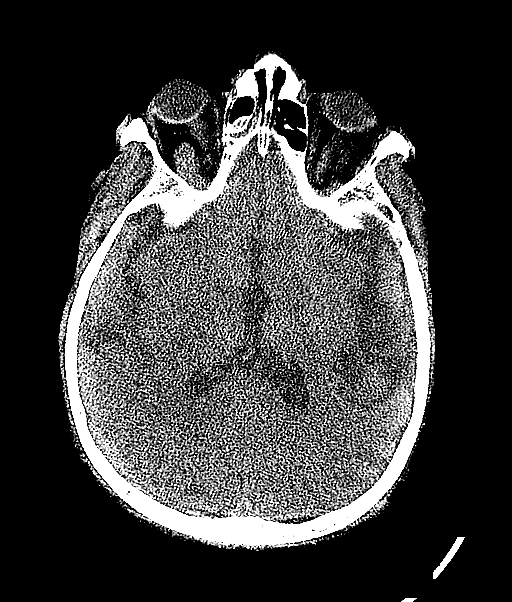
[im 50/91  bone]
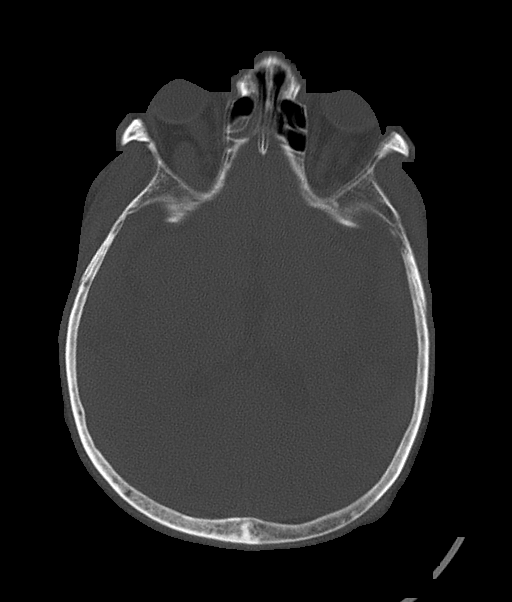
[im 64/91  brain]
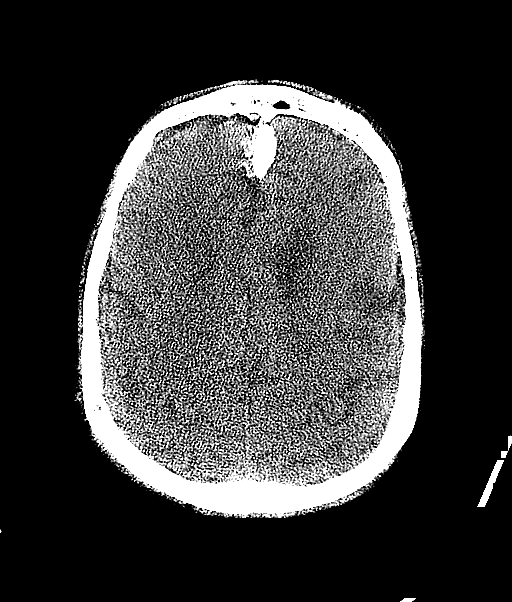
[im 73/91  brain]
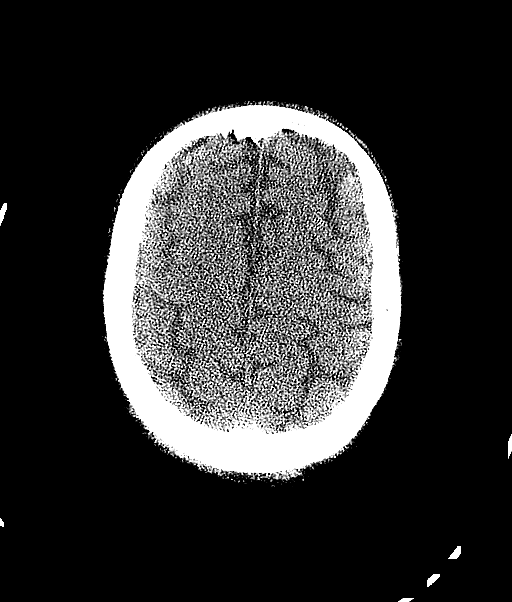
[im 82/91  brain]
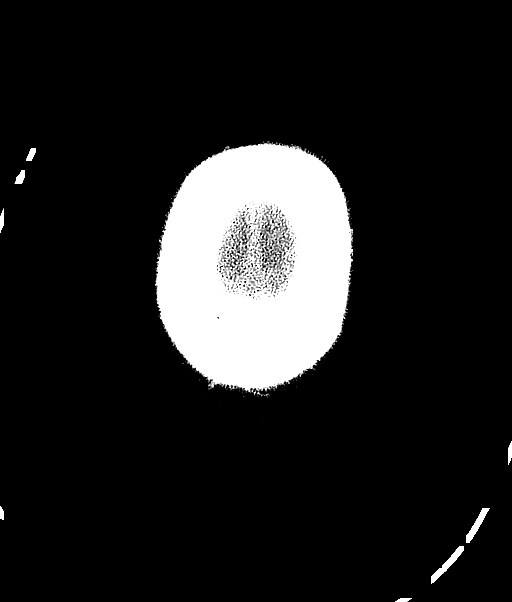

[14 of 47 positions shown; findings below may reference images not displayed]

FINDINGS: Motion degraded scan.

Brain: No evidence of parenchymal hemorrhage or extra-axial fluid
collection. No mass lesion, mass effect, or midline shift. No CT
evidence of acute infarction. Generalized cerebral volume loss.
Nonspecific mild to moderate subcortical and periventricular white
matter hypodensity, most in keeping with chronic small vessel
ischemic change. No ventriculomegaly.

Vascular: Intracranial atherosclerosis.  No acute abnormality.

Skull: No evidence of calvarial fracture.

Sinuses/Orbits: Mucoperiosteal thickening in the bilateral ethmoidal
air cells, right frontal sinus and sphenoid sinuses. No fluid
levels.

Other: Partial bilateral mastoid effusions, left greater than right.
Right nasal route tube enters the hypopharynx on the scout
tomograms.
IMPRESSION: 1.  No evidence of acute intracranial abnormality.
2. Generalized cerebral volume loss and chronic small vessel
ischemia.
3. Paranasal sinusitis.
4. Bilateral partial mastoid effusions.
